# Patient Record
Sex: Female | Born: 1965
Health system: Southern US, Community
[De-identification: ages and names within clinical notes are randomized; demographics above are authoritative.]

## PROBLEM LIST (undated history)

## (undated) DIAGNOSIS — R319 Hematuria, unspecified: Secondary | ICD-10-CM

## (undated) DIAGNOSIS — M545 Low back pain, unspecified: Secondary | ICD-10-CM

## (undated) DIAGNOSIS — J309 Allergic rhinitis, unspecified: Secondary | ICD-10-CM

## (undated) DIAGNOSIS — E039 Hypothyroidism, unspecified: Secondary | ICD-10-CM

## (undated) DIAGNOSIS — K589 Irritable bowel syndrome without diarrhea: Secondary | ICD-10-CM

## (undated) DIAGNOSIS — K219 Gastro-esophageal reflux disease without esophagitis: Secondary | ICD-10-CM

## (undated) DIAGNOSIS — D649 Anemia, unspecified: Secondary | ICD-10-CM

## (undated) DIAGNOSIS — R079 Chest pain, unspecified: Secondary | ICD-10-CM

## (undated) DIAGNOSIS — M797 Fibromyalgia: Secondary | ICD-10-CM

## (undated) DIAGNOSIS — Q211 Atrial septal defect, unspecified: Secondary | ICD-10-CM

## (undated) DIAGNOSIS — I10 Essential (primary) hypertension: Secondary | ICD-10-CM

## (undated) DIAGNOSIS — R51 Headache: Secondary | ICD-10-CM

## (undated) DIAGNOSIS — M069 Rheumatoid arthritis, unspecified: Secondary | ICD-10-CM

## (undated) DIAGNOSIS — M3500 Sicca syndrome, unspecified: Secondary | ICD-10-CM

## (undated) DIAGNOSIS — M419 Scoliosis, unspecified: Secondary | ICD-10-CM

## (undated) DIAGNOSIS — I495 Sick sinus syndrome: Secondary | ICD-10-CM

## (undated) HISTORY — DX: Sick sinus syndrome: I49.5

## (undated) HISTORY — DX: Low back pain: M54.5

## (undated) HISTORY — DX: Sjogren syndrome, unspecified: M35.00

## (undated) HISTORY — DX: Fibromyalgia: M79.7

## (undated) HISTORY — DX: Low back pain, unspecified: M54.50

## (undated) HISTORY — DX: Hematuria, unspecified: R31.9

## (undated) HISTORY — DX: Gastro-esophageal reflux disease without esophagitis: K21.9

## (undated) HISTORY — DX: Chest pain, unspecified: R07.9

## (undated) HISTORY — DX: Allergic rhinitis, unspecified: J30.9

## (undated) HISTORY — PX: TONSILLECTOMY AND ADENOIDECTOMY: SHX28

## (undated) HISTORY — DX: Anemia, unspecified: D64.9

## (undated) HISTORY — DX: Atrial septal defect: Q21.1

## (undated) HISTORY — DX: Essential (primary) hypertension: I10

## (undated) HISTORY — PX: TUBAL LIGATION: SHX77

## (undated) HISTORY — DX: Atrial septal defect, unspecified: Q21.10

## (undated) HISTORY — DX: Hypothyroidism, unspecified: E03.9

## (undated) HISTORY — DX: Rheumatoid arthritis, unspecified: M06.9

## (undated) HISTORY — DX: Headache: R51

## (undated) HISTORY — DX: Irritable bowel syndrome, unspecified: K58.9

---

## 1983-05-19 HISTORY — PX: ASD REPAIR: SHX258

## 1997-11-22 ENCOUNTER — Other Ambulatory Visit: Admission: RE | Admit: 1997-11-22 | Discharge: 1997-11-22 | Payer: Self-pay | Admitting: *Deleted

## 1998-07-23 ENCOUNTER — Emergency Department (HOSPITAL_COMMUNITY): Admission: EM | Admit: 1998-07-23 | Discharge: 1998-07-23 | Payer: Self-pay | Admitting: Emergency Medicine

## 1999-08-22 ENCOUNTER — Encounter: Payer: Self-pay | Admitting: Pulmonary Disease

## 1999-08-22 ENCOUNTER — Ambulatory Visit: Admission: RE | Admit: 1999-08-22 | Discharge: 1999-08-22 | Payer: Self-pay | Admitting: Pulmonary Disease

## 1999-09-03 ENCOUNTER — Emergency Department (HOSPITAL_COMMUNITY): Admission: EM | Admit: 1999-09-03 | Discharge: 1999-09-04 | Payer: Self-pay | Admitting: Emergency Medicine

## 1999-10-14 ENCOUNTER — Encounter: Payer: Self-pay | Admitting: Internal Medicine

## 1999-10-14 ENCOUNTER — Ambulatory Visit (HOSPITAL_COMMUNITY): Admission: RE | Admit: 1999-10-14 | Discharge: 1999-10-14 | Payer: Self-pay | Admitting: Internal Medicine

## 2000-04-27 ENCOUNTER — Ambulatory Visit (HOSPITAL_COMMUNITY): Admission: RE | Admit: 2000-04-27 | Discharge: 2000-04-27 | Payer: Self-pay | Admitting: *Deleted

## 2000-04-27 ENCOUNTER — Encounter: Payer: Self-pay | Admitting: *Deleted

## 2001-07-22 ENCOUNTER — Emergency Department (HOSPITAL_COMMUNITY): Admission: EM | Admit: 2001-07-22 | Discharge: 2001-07-22 | Payer: Self-pay | Admitting: *Deleted

## 2001-12-30 ENCOUNTER — Ambulatory Visit (HOSPITAL_COMMUNITY): Admission: RE | Admit: 2001-12-30 | Discharge: 2001-12-30 | Payer: Self-pay | Admitting: Pulmonary Disease

## 2001-12-30 ENCOUNTER — Encounter: Payer: Self-pay | Admitting: Pulmonary Disease

## 2003-02-18 ENCOUNTER — Ambulatory Visit (HOSPITAL_BASED_OUTPATIENT_CLINIC_OR_DEPARTMENT_OTHER): Admission: RE | Admit: 2003-02-18 | Discharge: 2003-02-18 | Payer: Self-pay | Admitting: Pulmonary Disease

## 2003-10-18 ENCOUNTER — Ambulatory Visit (HOSPITAL_COMMUNITY): Admission: RE | Admit: 2003-10-18 | Discharge: 2003-10-18 | Payer: Self-pay | Admitting: Pulmonary Disease

## 2004-04-28 ENCOUNTER — Ambulatory Visit: Payer: Self-pay | Admitting: Internal Medicine

## 2004-05-05 ENCOUNTER — Ambulatory Visit: Payer: Self-pay | Admitting: Pulmonary Disease

## 2004-08-25 ENCOUNTER — Ambulatory Visit: Payer: Self-pay | Admitting: Pulmonary Disease

## 2004-09-04 ENCOUNTER — Ambulatory Visit: Payer: Self-pay | Admitting: Cardiology

## 2004-09-24 ENCOUNTER — Ambulatory Visit: Payer: Self-pay | Admitting: Internal Medicine

## 2004-10-17 ENCOUNTER — Ambulatory Visit: Payer: Self-pay | Admitting: Pulmonary Disease

## 2004-11-25 ENCOUNTER — Ambulatory Visit: Payer: Self-pay | Admitting: Pulmonary Disease

## 2004-12-08 ENCOUNTER — Ambulatory Visit: Payer: Self-pay

## 2004-12-31 ENCOUNTER — Ambulatory Visit: Payer: Self-pay | Admitting: Pulmonary Disease

## 2005-01-06 ENCOUNTER — Ambulatory Visit: Payer: Self-pay | Admitting: Pulmonary Disease

## 2005-02-27 ENCOUNTER — Ambulatory Visit: Payer: Self-pay | Admitting: Internal Medicine

## 2005-04-01 ENCOUNTER — Ambulatory Visit: Payer: Self-pay | Admitting: Pulmonary Disease

## 2005-04-07 ENCOUNTER — Ambulatory Visit: Payer: Self-pay | Admitting: Pulmonary Disease

## 2005-06-08 ENCOUNTER — Ambulatory Visit: Payer: Self-pay | Admitting: Internal Medicine

## 2005-06-24 ENCOUNTER — Ambulatory Visit: Payer: Self-pay | Admitting: Internal Medicine

## 2005-06-25 ENCOUNTER — Ambulatory Visit: Payer: Self-pay | Admitting: Internal Medicine

## 2005-06-29 ENCOUNTER — Ambulatory Visit: Payer: Self-pay | Admitting: Internal Medicine

## 2005-07-03 ENCOUNTER — Ambulatory Visit: Payer: Self-pay | Admitting: Internal Medicine

## 2005-07-08 ENCOUNTER — Ambulatory Visit: Payer: Self-pay | Admitting: Internal Medicine

## 2005-07-15 ENCOUNTER — Ambulatory Visit: Payer: Self-pay | Admitting: Internal Medicine

## 2005-07-22 ENCOUNTER — Ambulatory Visit: Payer: Self-pay | Admitting: Internal Medicine

## 2005-07-24 ENCOUNTER — Ambulatory Visit: Payer: Self-pay | Admitting: Internal Medicine

## 2005-07-29 ENCOUNTER — Ambulatory Visit: Payer: Self-pay | Admitting: Internal Medicine

## 2005-07-31 ENCOUNTER — Ambulatory Visit: Payer: Self-pay | Admitting: Internal Medicine

## 2005-08-05 ENCOUNTER — Ambulatory Visit: Payer: Self-pay | Admitting: Internal Medicine

## 2005-08-06 ENCOUNTER — Ambulatory Visit: Payer: Self-pay | Admitting: Internal Medicine

## 2005-08-10 ENCOUNTER — Ambulatory Visit: Payer: Self-pay | Admitting: Pulmonary Disease

## 2005-08-10 ENCOUNTER — Ambulatory Visit: Payer: Self-pay | Admitting: Internal Medicine

## 2005-08-19 ENCOUNTER — Ambulatory Visit: Payer: Self-pay | Admitting: Internal Medicine

## 2005-08-20 ENCOUNTER — Ambulatory Visit: Payer: Self-pay | Admitting: Internal Medicine

## 2005-08-25 ENCOUNTER — Ambulatory Visit: Payer: Self-pay | Admitting: Internal Medicine

## 2005-08-27 ENCOUNTER — Ambulatory Visit: Payer: Self-pay | Admitting: Internal Medicine

## 2005-08-31 ENCOUNTER — Ambulatory Visit: Payer: Self-pay | Admitting: Internal Medicine

## 2005-09-02 ENCOUNTER — Ambulatory Visit: Payer: Self-pay | Admitting: Internal Medicine

## 2005-09-04 ENCOUNTER — Ambulatory Visit: Payer: Self-pay | Admitting: Internal Medicine

## 2005-09-08 ENCOUNTER — Ambulatory Visit: Payer: Self-pay | Admitting: Internal Medicine

## 2005-09-09 ENCOUNTER — Ambulatory Visit: Payer: Self-pay | Admitting: Pulmonary Disease

## 2005-09-15 ENCOUNTER — Ambulatory Visit: Payer: Self-pay | Admitting: Internal Medicine

## 2005-09-22 ENCOUNTER — Ambulatory Visit: Payer: Self-pay | Admitting: Internal Medicine

## 2005-09-24 ENCOUNTER — Ambulatory Visit: Payer: Self-pay | Admitting: Internal Medicine

## 2005-10-14 ENCOUNTER — Ambulatory Visit: Payer: Self-pay | Admitting: Internal Medicine

## 2005-10-16 ENCOUNTER — Ambulatory Visit: Payer: Self-pay | Admitting: Internal Medicine

## 2005-10-19 ENCOUNTER — Ambulatory Visit: Payer: Self-pay | Admitting: Pulmonary Disease

## 2005-10-19 ENCOUNTER — Ambulatory Visit: Payer: Self-pay | Admitting: Internal Medicine

## 2005-10-22 ENCOUNTER — Ambulatory Visit: Payer: Self-pay | Admitting: Internal Medicine

## 2005-10-26 ENCOUNTER — Ambulatory Visit: Payer: Self-pay | Admitting: Cardiology

## 2005-10-28 ENCOUNTER — Ambulatory Visit: Payer: Self-pay | Admitting: Internal Medicine

## 2005-10-30 ENCOUNTER — Ambulatory Visit: Payer: Self-pay | Admitting: Internal Medicine

## 2005-11-04 ENCOUNTER — Ambulatory Visit: Payer: Self-pay | Admitting: Internal Medicine

## 2005-11-25 ENCOUNTER — Ambulatory Visit: Payer: Self-pay | Admitting: Internal Medicine

## 2005-11-30 ENCOUNTER — Ambulatory Visit: Payer: Self-pay | Admitting: Internal Medicine

## 2005-11-30 ENCOUNTER — Ambulatory Visit: Payer: Self-pay | Admitting: Pulmonary Disease

## 2005-12-23 ENCOUNTER — Ambulatory Visit: Payer: Self-pay | Admitting: Internal Medicine

## 2005-12-30 ENCOUNTER — Ambulatory Visit: Payer: Self-pay | Admitting: Internal Medicine

## 2006-01-05 ENCOUNTER — Ambulatory Visit: Payer: Self-pay | Admitting: Internal Medicine

## 2006-01-15 ENCOUNTER — Ambulatory Visit: Payer: Self-pay | Admitting: Pulmonary Disease

## 2006-01-20 ENCOUNTER — Ambulatory Visit: Payer: Self-pay | Admitting: Internal Medicine

## 2006-01-29 ENCOUNTER — Ambulatory Visit: Payer: Self-pay | Admitting: Internal Medicine

## 2006-02-05 ENCOUNTER — Ambulatory Visit: Payer: Self-pay | Admitting: Internal Medicine

## 2006-03-05 ENCOUNTER — Ambulatory Visit: Payer: Self-pay | Admitting: Internal Medicine

## 2006-03-08 ENCOUNTER — Ambulatory Visit: Payer: Self-pay | Admitting: Pulmonary Disease

## 2006-06-22 ENCOUNTER — Ambulatory Visit: Payer: Self-pay | Admitting: Internal Medicine

## 2006-07-06 ENCOUNTER — Ambulatory Visit: Payer: Self-pay | Admitting: Pulmonary Disease

## 2006-09-01 ENCOUNTER — Ambulatory Visit: Payer: Self-pay | Admitting: Pulmonary Disease

## 2006-10-27 ENCOUNTER — Emergency Department (HOSPITAL_COMMUNITY): Admission: EM | Admit: 2006-10-27 | Discharge: 2006-10-27 | Payer: Self-pay | Admitting: Emergency Medicine

## 2006-11-15 ENCOUNTER — Ambulatory Visit: Payer: Self-pay | Admitting: Pulmonary Disease

## 2007-01-28 ENCOUNTER — Ambulatory Visit: Payer: Self-pay | Admitting: Pulmonary Disease

## 2007-02-03 ENCOUNTER — Ambulatory Visit: Payer: Self-pay | Admitting: Pulmonary Disease

## 2007-03-21 DIAGNOSIS — J3089 Other allergic rhinitis: Secondary | ICD-10-CM | POA: Insufficient documentation

## 2007-03-21 DIAGNOSIS — K5900 Constipation, unspecified: Secondary | ICD-10-CM | POA: Insufficient documentation

## 2007-03-21 DIAGNOSIS — D649 Anemia, unspecified: Secondary | ICD-10-CM | POA: Insufficient documentation

## 2007-03-21 DIAGNOSIS — J309 Allergic rhinitis, unspecified: Secondary | ICD-10-CM

## 2007-03-21 DIAGNOSIS — M545 Low back pain, unspecified: Secondary | ICD-10-CM | POA: Insufficient documentation

## 2007-03-21 DIAGNOSIS — K589 Irritable bowel syndrome without diarrhea: Secondary | ICD-10-CM | POA: Insufficient documentation

## 2007-03-21 DIAGNOSIS — IMO0001 Reserved for inherently not codable concepts without codable children: Secondary | ICD-10-CM | POA: Insufficient documentation

## 2007-03-21 DIAGNOSIS — M069 Rheumatoid arthritis, unspecified: Secondary | ICD-10-CM | POA: Insufficient documentation

## 2007-03-21 DIAGNOSIS — E039 Hypothyroidism, unspecified: Secondary | ICD-10-CM | POA: Insufficient documentation

## 2007-03-21 DIAGNOSIS — K219 Gastro-esophageal reflux disease without esophagitis: Secondary | ICD-10-CM | POA: Insufficient documentation

## 2007-03-21 DIAGNOSIS — I10 Essential (primary) hypertension: Secondary | ICD-10-CM | POA: Insufficient documentation

## 2007-04-11 ENCOUNTER — Ambulatory Visit: Payer: Self-pay | Admitting: Pulmonary Disease

## 2007-05-27 ENCOUNTER — Telehealth: Payer: Self-pay | Admitting: Pulmonary Disease

## 2007-05-27 ENCOUNTER — Ambulatory Visit: Payer: Self-pay | Admitting: Pulmonary Disease

## 2007-06-29 ENCOUNTER — Ambulatory Visit: Payer: Self-pay | Admitting: Pulmonary Disease

## 2007-06-29 DIAGNOSIS — F411 Generalized anxiety disorder: Secondary | ICD-10-CM | POA: Insufficient documentation

## 2007-06-29 DIAGNOSIS — Q2111 Secundum atrial septal defect: Secondary | ICD-10-CM | POA: Insufficient documentation

## 2007-06-29 DIAGNOSIS — Q211 Atrial septal defect: Secondary | ICD-10-CM | POA: Insufficient documentation

## 2007-07-15 ENCOUNTER — Ambulatory Visit: Payer: Self-pay | Admitting: Internal Medicine

## 2007-08-02 ENCOUNTER — Telehealth (INDEPENDENT_AMBULATORY_CARE_PROVIDER_SITE_OTHER): Payer: Self-pay | Admitting: *Deleted

## 2007-08-03 ENCOUNTER — Ambulatory Visit: Payer: Self-pay | Admitting: Pulmonary Disease

## 2007-08-11 ENCOUNTER — Encounter: Payer: Self-pay | Admitting: Pulmonary Disease

## 2007-08-25 ENCOUNTER — Telehealth: Payer: Self-pay | Admitting: Pulmonary Disease

## 2007-09-04 ENCOUNTER — Encounter: Payer: Self-pay | Admitting: Adult Health

## 2007-09-07 ENCOUNTER — Telehealth: Payer: Self-pay | Admitting: Pulmonary Disease

## 2007-09-09 ENCOUNTER — Ambulatory Visit: Payer: Self-pay | Admitting: Internal Medicine

## 2007-09-09 DIAGNOSIS — R519 Headache, unspecified: Secondary | ICD-10-CM | POA: Insufficient documentation

## 2007-09-09 DIAGNOSIS — R51 Headache: Secondary | ICD-10-CM | POA: Insufficient documentation

## 2007-12-05 ENCOUNTER — Telehealth (INDEPENDENT_AMBULATORY_CARE_PROVIDER_SITE_OTHER): Payer: Self-pay | Admitting: *Deleted

## 2007-12-19 ENCOUNTER — Ambulatory Visit: Payer: Self-pay | Admitting: Internal Medicine

## 2007-12-19 ENCOUNTER — Ambulatory Visit: Payer: Self-pay | Admitting: Pulmonary Disease

## 2008-01-11 ENCOUNTER — Ambulatory Visit: Payer: Self-pay | Admitting: Internal Medicine

## 2008-02-08 ENCOUNTER — Encounter: Payer: Self-pay | Admitting: Pulmonary Disease

## 2008-02-09 ENCOUNTER — Encounter: Payer: Self-pay | Admitting: Pulmonary Disease

## 2008-03-16 ENCOUNTER — Encounter: Payer: Self-pay | Admitting: Pulmonary Disease

## 2008-03-22 ENCOUNTER — Encounter: Payer: Self-pay | Admitting: Pulmonary Disease

## 2008-03-30 ENCOUNTER — Encounter: Payer: Self-pay | Admitting: Pulmonary Disease

## 2008-05-30 ENCOUNTER — Encounter: Payer: Self-pay | Admitting: Pulmonary Disease

## 2008-06-13 ENCOUNTER — Telehealth: Payer: Self-pay | Admitting: Pulmonary Disease

## 2008-08-06 ENCOUNTER — Ambulatory Visit: Payer: Self-pay | Admitting: Internal Medicine

## 2008-08-06 ENCOUNTER — Encounter: Payer: Self-pay | Admitting: Internal Medicine

## 2008-08-06 DIAGNOSIS — I495 Sick sinus syndrome: Secondary | ICD-10-CM | POA: Insufficient documentation

## 2008-08-08 ENCOUNTER — Encounter: Payer: Self-pay | Admitting: Pulmonary Disease

## 2008-08-30 ENCOUNTER — Telehealth (INDEPENDENT_AMBULATORY_CARE_PROVIDER_SITE_OTHER): Payer: Self-pay | Admitting: *Deleted

## 2008-08-30 ENCOUNTER — Emergency Department (HOSPITAL_BASED_OUTPATIENT_CLINIC_OR_DEPARTMENT_OTHER): Admission: EM | Admit: 2008-08-30 | Discharge: 2008-08-30 | Payer: Self-pay | Admitting: Emergency Medicine

## 2008-10-21 ENCOUNTER — Emergency Department (HOSPITAL_BASED_OUTPATIENT_CLINIC_OR_DEPARTMENT_OTHER): Admission: EM | Admit: 2008-10-21 | Discharge: 2008-10-21 | Payer: Self-pay | Admitting: Emergency Medicine

## 2008-10-26 ENCOUNTER — Emergency Department (HOSPITAL_BASED_OUTPATIENT_CLINIC_OR_DEPARTMENT_OTHER): Admission: EM | Admit: 2008-10-26 | Discharge: 2008-10-26 | Payer: Self-pay | Admitting: Emergency Medicine

## 2008-11-02 ENCOUNTER — Telehealth: Payer: Self-pay | Admitting: Pulmonary Disease

## 2008-11-08 ENCOUNTER — Ambulatory Visit: Payer: Self-pay | Admitting: Pulmonary Disease

## 2008-11-15 ENCOUNTER — Encounter: Payer: Self-pay | Admitting: Pulmonary Disease

## 2008-12-11 ENCOUNTER — Emergency Department (HOSPITAL_BASED_OUTPATIENT_CLINIC_OR_DEPARTMENT_OTHER): Admission: EM | Admit: 2008-12-11 | Discharge: 2008-12-11 | Payer: Self-pay | Admitting: Emergency Medicine

## 2009-01-15 ENCOUNTER — Telehealth (INDEPENDENT_AMBULATORY_CARE_PROVIDER_SITE_OTHER): Payer: Self-pay | Admitting: *Deleted

## 2009-01-29 ENCOUNTER — Encounter: Payer: Self-pay | Admitting: Pulmonary Disease

## 2009-02-08 ENCOUNTER — Encounter: Payer: Self-pay | Admitting: Pulmonary Disease

## 2009-02-20 ENCOUNTER — Encounter: Payer: Self-pay | Admitting: Pulmonary Disease

## 2009-03-05 ENCOUNTER — Encounter: Payer: Self-pay | Admitting: Pulmonary Disease

## 2009-04-04 ENCOUNTER — Ambulatory Visit: Payer: Self-pay | Admitting: Pulmonary Disease

## 2009-04-19 ENCOUNTER — Encounter: Payer: Self-pay | Admitting: Pulmonary Disease

## 2009-06-08 ENCOUNTER — Emergency Department (HOSPITAL_BASED_OUTPATIENT_CLINIC_OR_DEPARTMENT_OTHER): Admission: EM | Admit: 2009-06-08 | Discharge: 2009-06-08 | Payer: Self-pay | Admitting: Emergency Medicine

## 2009-07-25 ENCOUNTER — Ambulatory Visit: Payer: Self-pay | Admitting: Pulmonary Disease

## 2009-07-25 ENCOUNTER — Encounter: Payer: Self-pay | Admitting: Adult Health

## 2009-10-15 ENCOUNTER — Telehealth: Payer: Self-pay | Admitting: Internal Medicine

## 2009-10-31 ENCOUNTER — Telehealth: Payer: Self-pay | Admitting: Pulmonary Disease

## 2009-12-17 ENCOUNTER — Encounter (INDEPENDENT_AMBULATORY_CARE_PROVIDER_SITE_OTHER): Payer: Self-pay | Admitting: *Deleted

## 2010-01-22 ENCOUNTER — Ambulatory Visit: Payer: Self-pay | Admitting: Pulmonary Disease

## 2010-01-25 LAB — CONVERTED CEMR LAB
ALT: 18 units/L (ref 0–35)
AST: 27 units/L (ref 0–37)
Albumin: 4 g/dL (ref 3.5–5.2)
Alkaline Phosphatase: 46 units/L (ref 39–117)
BUN: 10 mg/dL (ref 6–23)
Basophils Absolute: 0 10*3/uL (ref 0.0–0.1)
Basophils Relative: 0.3 % (ref 0.0–3.0)
Bilirubin, Direct: 0.1 mg/dL (ref 0.0–0.3)
CO2: 29 meq/L (ref 19–32)
Calcium: 9.1 mg/dL (ref 8.4–10.5)
Chloride: 102 meq/L (ref 96–112)
Creatinine, Ser: 0.8 mg/dL (ref 0.4–1.2)
Eosinophils Absolute: 0 10*3/uL (ref 0.0–0.7)
Eosinophils Relative: 0.6 % (ref 0.0–5.0)
GFR calc non Af Amer: 100.7 mL/min (ref >60)
Glucose, Bld: 79 mg/dL (ref 70–99)
HCT: 38.6 % (ref 36.0–46.0)
Hemoglobin: 13 g/dL (ref 12.0–15.0)
Lymphocytes Relative: 30.4 % (ref 12.0–46.0)
Lymphs Abs: 1.5 10*3/uL (ref 0.7–4.0)
MCHC: 33.7 g/dL (ref 30.0–36.0)
MCV: 89.4 fL (ref 78.0–100.0)
Monocytes Absolute: 0.6 10*3/uL (ref 0.1–1.0)
Monocytes Relative: 12.5 % — ABNORMAL HIGH (ref 3.0–12.0)
Neutro Abs: 2.7 10*3/uL (ref 1.4–7.7)
Neutrophils Relative %: 56.2 % (ref 43.0–77.0)
Platelets: 150 10*3/uL (ref 150.0–400.0)
Potassium: 3.9 meq/L (ref 3.5–5.1)
RBC: 4.32 M/uL (ref 3.87–5.11)
RDW: 14.3 % (ref 11.5–14.6)
Sed Rate: 22 mm/hr (ref 0–22)
Sodium: 138 meq/L (ref 135–145)
TSH: 7.79 microintl units/mL — ABNORMAL HIGH (ref 0.35–5.50)
Total Bilirubin: 0.8 mg/dL (ref 0.3–1.2)
Total Protein: 8 g/dL (ref 6.0–8.3)
WBC: 4.8 10*3/uL (ref 4.5–10.5)

## 2010-01-29 ENCOUNTER — Telehealth: Payer: Self-pay | Admitting: Pulmonary Disease

## 2010-02-13 ENCOUNTER — Ambulatory Visit: Payer: Self-pay | Admitting: Pulmonary Disease

## 2010-02-13 ENCOUNTER — Telehealth: Payer: Self-pay | Admitting: Pulmonary Disease

## 2010-02-14 ENCOUNTER — Ambulatory Visit: Payer: Self-pay | Admitting: Internal Medicine

## 2010-02-14 DIAGNOSIS — R079 Chest pain, unspecified: Secondary | ICD-10-CM | POA: Insufficient documentation

## 2010-02-16 LAB — CONVERTED CEMR LAB
Bilirubin Urine: NEGATIVE
Ketones, ur: NEGATIVE mg/dL
Leukocytes, UA: NEGATIVE
Nitrite: NEGATIVE
Specific Gravity, Urine: 1.01 (ref 1.000–1.030)
Total Protein, Urine: 30 mg/dL
Urine Glucose: NEGATIVE mg/dL
Urobilinogen, UA: 0.2 (ref 0.0–1.0)
pH: 5.5 (ref 5.0–8.0)

## 2010-02-21 ENCOUNTER — Ambulatory Visit: Payer: Self-pay | Admitting: Pulmonary Disease

## 2010-02-21 LAB — CONVERTED CEMR LAB
Bilirubin Urine: NEGATIVE
Ketones, ur: NEGATIVE mg/dL
Leukocytes, UA: NEGATIVE
Nitrite: NEGATIVE
Specific Gravity, Urine: 1.015 (ref 1.000–1.030)
Total Protein, Urine: NEGATIVE mg/dL
Urine Glucose: NEGATIVE mg/dL
Urobilinogen, UA: 0.2 (ref 0.0–1.0)
pH: 7 (ref 5.0–8.0)

## 2010-02-24 ENCOUNTER — Telehealth (INDEPENDENT_AMBULATORY_CARE_PROVIDER_SITE_OTHER): Payer: Self-pay | Admitting: Radiology

## 2010-02-25 ENCOUNTER — Ambulatory Visit: Payer: Self-pay | Admitting: Internal Medicine

## 2010-02-25 ENCOUNTER — Encounter: Payer: Self-pay | Admitting: Internal Medicine

## 2010-02-25 ENCOUNTER — Ambulatory Visit: Payer: Self-pay

## 2010-02-25 ENCOUNTER — Encounter (HOSPITAL_COMMUNITY)
Admission: RE | Admit: 2010-02-25 | Discharge: 2010-03-14 | Payer: Self-pay | Source: Home / Self Care | Admitting: Internal Medicine

## 2010-02-27 ENCOUNTER — Telehealth: Payer: Self-pay | Admitting: Internal Medicine

## 2010-03-10 ENCOUNTER — Telehealth (INDEPENDENT_AMBULATORY_CARE_PROVIDER_SITE_OTHER): Payer: Self-pay | Admitting: *Deleted

## 2010-03-11 ENCOUNTER — Telehealth: Payer: Self-pay | Admitting: Internal Medicine

## 2010-03-11 LAB — CONVERTED CEMR LAB
ALT: 5 units/L (ref 0–35)
AST: 34 units/L (ref 0–37)
Albumin: 3.8 g/dL (ref 3.5–5.2)
Alkaline Phosphatase: 45 units/L (ref 39–117)
Bilirubin, Direct: 0.6 mg/dL — ABNORMAL HIGH (ref 0.0–0.3)
Cholesterol: 126 mg/dL (ref 0–200)
HDL: 42 mg/dL (ref >39.00)
LDL Cholesterol: 75 mg/dL (ref 0–99)
Total Bilirubin: 1.5 mg/dL — ABNORMAL HIGH (ref 0.3–1.2)
Total CHOL/HDL Ratio: 3
Total Protein: 7.4 g/dL (ref 6.0–8.3)
Triglycerides: 43 mg/dL (ref 0.0–149.0)
VLDL: 8.6 mg/dL (ref 0.0–40.0)

## 2010-03-13 ENCOUNTER — Encounter: Payer: Self-pay | Admitting: Pulmonary Disease

## 2010-03-25 ENCOUNTER — Ambulatory Visit: Payer: Self-pay | Admitting: Pulmonary Disease

## 2010-03-25 DIAGNOSIS — R319 Hematuria, unspecified: Secondary | ICD-10-CM | POA: Insufficient documentation

## 2010-03-26 LAB — CONVERTED CEMR LAB: TSH: 4.67 microintl units/mL (ref 0.35–5.50)

## 2010-03-27 ENCOUNTER — Encounter: Payer: Self-pay | Admitting: Pulmonary Disease

## 2010-06-17 NOTE — Letter (Signed)
Summary: rx possible changes/[facility]  rx possible changes/[facility]   Imported By: Lester Sweetwater 02/29/2008 10:34:30  _____________________________________________________________________  External Attachment:    Type:   Image     Comment:   External Document

## 2010-06-17 NOTE — Assessment & Plan Note (Signed)
Summary: fu after er visit/[facility]   Chief Complaint:  cough congesiton.  History of Present Illness: 45 year old BF hx of  RA and Fibromyalgia and is followed by DrDeveshwar for rheumatology on Prednisone, Plaquenil, Lyrica, Imipramine, Flexeril, Tramadol, and Vicodin...   12/19/07- Compalins of 1 week of sinus congestion, sinus press/pain, drainage. Denies chest pain, dyspnea, orthopnea, hemoptysis, fever, n/v/d, edema.      Updated Prior Medication List: PROAIR HFA 108 (90 BASE) MCG/ACT  AERS (ALBUTEROL SULFATE) 1-2 inhalations QID as needed for wheezing... NASONEX 50 MCG/ACT  SUSP (MOMETASONE FUROATE) 2 sprays in each nostril at bedtime FEXOFENADINE HCL 180 MG  TABS (FEXOFENADINE HCL) take 1 by mouth once daily for allergies... NORVASC 5 MG  TABS (AMLODIPINE BESYLATE) take 1 by mouth once daily NEXIUM 40 MG  CPDR (ESOMEPRAZOLE MAGNESIUM) Take 1 tablet by mouth once a day METOCLOPRAMIDE HCL 10 MG  TABS (METOCLOPRAMIDE HCL) take as directed... HYOSCYAMINE SULFATE 0.125 MG  TABS (HYOSCYAMINE SULFATE) take 1 every 4 hours as needed MIRALAX   POWD (POLYETHYLENE GLYCOL 3350) 1 capful in water once or twice daily as directed... SYNTHROID 137 MCG  TABS (LEVOTHYROXINE SODIUM) Take 1 tablet by mouth once a day PLAQUENIL 200 MG  TABS (HYDROXYCHLOROQUINE SULFATE) take one tab daily as directed by DrDeveshwar... LYRICA 50 MG  CAPS (PREGABALIN) Take one tab by mouth three times a day as directed... PREDNISONE 5 MG  TABS (PREDNISONE) take as directed by DrDeveshwar... CENTRUM   TABS (MULTIPLE VITAMINS-MINERALS) Take 1 tablet by mouth once a day FOLIC ACID 1 MG  TABS (FOLIC ACID) take one tab by mouth two times a day as directed... TRAMADOL HCL 50 MG  TABS (TRAMADOL HCL) take 1 every 4 hours as needed HYDROCODONE-ACETAMINOPHEN 5-500 MG  TABS (HYDROCODONE-ACETAMINOPHEN) take one tab every 6-8 H as needed for pain... IMIPRAMINE HCL 25 MG  TABS (IMIPRAMINE HCL) take 1-2 tabs by mouth at bedtime as needed for  sleep... CYCLOBENZAPRINE HCL 10 MG  TABS (CYCLOBENZAPRINE HCL) take 1-2 tabs by mouth at bedtime as needed for sleep... MIDRIN 325-65-100 MG  CAPS (APAP-ISOMETHEPTENE-DICHLORAL) 1 by mouth every 4 hr as needed headache  Current Allergies (reviewed today): No known allergies    Family History:    Reviewed history and no changes required:  Social History:    Reviewed history and no changes required:   Risk Factors: Tobacco use:  never   Review of Systems      See HPI   Vital Signs:  Patient Profile:   45 Years Old Female Weight:      133 pounds O2 Sat:      100 % O2 treatment:    Room Air Temp:     98.4 degrees F oral Pulse rate:   44 / minute BP sitting:   126 / 76  (left arm) Cuff size:   regular  Vitals Entered By: Boone Master CNA (December 19, 2007 9:28 AM)             Is Patient Diabetic? No Comments Medications reviewed with patient Boone Master CNA  December 19, 2007 9:30 AM      Physical Exam  WD, Thin, [name] in NAD... GENERAL:  Alert & oriented; pleasant & cooperative. HEENT:  [place]/AT, EOM-wnl, PERRLA, EACs-clear, TMs-wnl, NOSE-red, max. sinus tender.  THROAT-clear & wnl. NECK:  Tender post neck area to right, +triggers in trapezius, decr ROM neck, neuromusc intact w/ norm DTR's & strength;  no JVD; normal carotid impulses w/o  bruits; no thyromegaly or nodules palpated; no lymphadenopathy. CHEST:  Clear to P & A; without wheezes/ rales/ or rhonchi. HEART:  Regular Rhythm;  gr 1-2 SEM without rubs or gallops... ABDOMEN:  Soft & nontender; normal bowel sounds; no organomegaly or masses detected. EXT: without deformities, mild arthritic changes; no varicose veins/ venous insuffic/ or edema. DERM:  No lesions noted; no rash etc...        Impression & Recommendations:  Problem # 1:  SINUSITIS, ACUTE (ICD-461.9) Omnicef 300mg  2 by mouth once daily with food for 10 days Mucinex two times a day  Saline and Afrin 2 puffs two times a day for 5 days    Please contact office for sooner follow up if symptoms do not improve or worsen  Her updated medication list for this problem includes:    Nasonex 50 Mcg/act Susp (Mometasone furoate) .Marland Kitchen... 2 sprays in each nostril at bedtime    Omnicef 300 Mg Caps (Cefdinir) .Marland Kitchen... 2 by mouth once daily  Orders: Est. Patient Level III (81191)   Medications Added to Medication List This Visit: 1)  Omnicef 300 Mg Caps (Cefdinir) .... 2 by mouth once daily  Complete Medication List: 1)  Proair Hfa 108 (90 Base) Mcg/act Aers (Albuterol sulfate) .Marland Kitchen.. 1-2 inhalations qid as needed for wheezing... 2)  Nasonex 50 Mcg/act Susp (Mometasone furoate) .... 2 sprays in each nostril at bedtime 3)  Fexofenadine Hcl 180 Mg Tabs (Fexofenadine hcl) .... Take 1 by mouth once daily for allergies.Marland KitchenMarland Kitchen 4)  Norvasc 5 Mg Tabs (Amlodipine besylate) .... Take 1 by mouth once daily 5)  Nexium 40 Mg Cpdr (Esomeprazole magnesium) .... Take 1 tablet by mouth once a day 6)  Metoclopramide Hcl 10 Mg Tabs (Metoclopramide hcl) .... Take as directed... 7)  Hyoscyamine Sulfate 0.125 Mg Tabs (Hyoscyamine sulfate) .... Take 1 every 4 hours as needed 8)  Miralax Powd (Polyethylene glycol 3350) .Marland Kitchen.. 1 capful in water once or twice daily as directed... 9)  Synthroid 137 Mcg Tabs (Levothyroxine sodium) .... Take 1 tablet by mouth once a day 10)  Plaquenil 200 Mg Tabs (Hydroxychloroquine sulfate) .... Take one tab daily as directed by drdeveshwar... 11)  Lyrica 50 Mg Caps (Pregabalin) .... Take one tab by mouth three times a day as directed... 12)  Prednisone 5 Mg Tabs (Prednisone) .... Take as directed by drdeveshwar... 13)  Centrum Tabs (Multiple vitamins-minerals) .... Take 1 tablet by mouth once a day 14)  Folic Acid 1 Mg Tabs (Folic acid) .... Take one tab by mouth two times a day as directed... 15)  Tramadol Hcl 50 Mg Tabs (Tramadol hcl) .... Take 1 every 4 hours as needed 16)  Hydrocodone-acetaminophen 5-500 Mg Tabs  (Hydrocodone-acetaminophen) .... Take one tab every 6-8 h as needed for pain... 17)  Imipramine Hcl 25 Mg Tabs (Imipramine hcl) .... Take 1-2 tabs by mouth at bedtime as needed for sleep... 18)  Cyclobenzaprine Hcl 10 Mg Tabs (Cyclobenzaprine hcl) .... Take 1-2 tabs by mouth at bedtime as needed for sleep... 19)  Midrin 325-65-100 Mg Caps (Apap-isometheptene-dichloral) .Marland Kitchen.. 1 by mouth every 4 hr as needed headache 20)  Omnicef 300 Mg Caps (Cefdinir) .... 2 by mouth once daily  Other Orders: T-2 View CXR, Same Day (71020.5TC)   Patient Instructions: 1)  Omnicef 300mg  2 by mouth once daily with food for 10 days 2)  Mucinex two times a day  3)  Saline and Afrin 2 puffs two times a day for 5 days  4)  Please  contact office for sooner follow up if symptoms do not improve or worsen    Prescriptions: OMNICEF 300 MG  CAPS (CEFDINIR) 2 by mouth once daily  #20 x 0   Entered and Authorized by:   Rubye Oaks NP   Signed by:   Tammy Parrett NP on 12/19/2007   Method used:   Electronically sent to ...       [id number]       [address]       [place], [place]  [address]       Ph: [phone] or [phone]       Fax: [phone]   RxID:   [id number]  ]

## 2010-06-17 NOTE — Assessment & Plan Note (Signed)
Summary: Brittany Hodges   Visit Type:  Follow-up Primary Provider:  nadel  CC:  no cardiac concerns.  History of Present Illness: Brittany Hodges has no complaints related exercise intolerance; it remained quite stable.  Current Problems (verified): 1)  Sinoatrial Node Dysfunction  (ICD-427.81) 2)  Sinusitis, Acute  (ICD-461.9) 3)  Headache, Chronic  (ICD-784.0) 4)  Neck Pain, Acute  (ICD-723.1) 5)  Allergic Rhinitis  (ICD-477.9) 6)  Asthma  (ICD-493.90) 7)  Hypertension  (ICD-401.9) 8)  Hx of Atrial Septal Defect  (ICD-745.5) 9)  Hypothyroidism  (ICD-244.9) 10)  Gerd  (ICD-530.81) 11)  Irritable Bowel Syndrome  (ICD-564.1) 12)  Constipation  (ICD-564.00) 13)  Rheumatoid Arthritis  (ICD-714.0) 14)  Fibromyalgia  (ICD-729.1) 15)  Low Back Pain  (ICD-724.2) 16)  Anxiety  (ICD-300.00) 17)  Anemia  (ICD-285.9) 18)  Sebaceous Cyst, Scalp  (ICD-706.2)  Current Medications (verified): 1)  Proair Hfa 108 (90 Base) Mcg/act  Aers (Albuterol Sulfate) .Marland Kitchen.. 1-2 Inhalations Qid As Needed For Wheezing... 2)  Nasonex 50 Mcg/act  Susp (Mometasone Furoate) .... 2 Sprays in Each Nostril At Bedtime 3)  Fexofenadine Hcl 180 Mg  Tabs (Fexofenadine Hcl) .... Take 1 By Mouth Once Daily For Allergies.Marland KitchenMarland Kitchen 4)  Norvasc 5 Mg  Tabs (Amlodipine Besylate) .... Take 1 By Mouth Once Daily 5)  Nexium 40 Mg  Cpdr (Esomeprazole Magnesium) .... Take 1 Tablet By Mouth Once A Day 6)  Miralax   Powd (Polyethylene Glycol 3350) .Marland Kitchen.. 1 Capful in Water Once or Twice Daily As Directed... 7)  Prednisone 5 Mg  Tabs (Prednisone) .... Take As Directed By [name]... 8)  Centrum   Tabs (Multiple Vitamins-Minerals) .... Take 1 Tablet By Mouth Once A Day 9)  Folic Acid 1 Mg  Tabs (Folic Acid) .... Take One Tab By Mouth Two Times A Day As Directed... 10)  Tramadol Hcl 50 Mg  Tabs (Tramadol Hcl) .... Take 1 Every 4 Hours As Needed 11)  Hydrocodone-Acetaminophen 5-500 Mg  Tabs (Hydrocodone-Acetaminophen) .... Take One Tab Every  6-8 H As Needed For Pain... 12)  Midrin 325-65-100 Mg Caps (Apap-Isometheptene-Dichloral) .... Take One Tablet By Mouth Every 4 Hours As Needed For Headache 13)  Mucinex D 60-600 Mg Xr12h-Tab (Pseudoephedrine-Guaifenesin) .... Take 1 Tablets By Mouth Two Times A Day With Lots of Fluids 14)  Levothyroxine Sodium 88 Mcg Tabs (Levothyroxine Sodium) .Marland Kitchen.. 1 Once Daily 15)  Promethazine Hcl 25 Mg Tabs (Promethazine Hcl) .... As Needed 16)  Vitamin D 16109 Unit  Caps (Ergocalciferol) .... Twice A Week For 8 Weeks 17)  Cefdinir 300 Mg Caps (Cefdinir) .... 2 Daily 18)  Methotrexate 2.5 Mg Tabs (Methotrexate Sodium) .... 6 Tabs Per Week 19)  Simply Saline 0.9 % Aers (Saline) .... Prn 20)  Afrin .... Prn  Allergies (verified): No Known Drug Allergies  Vital Signs:  Patient profile:   45 year old female Height:      68 inches Weight:      140 pounds BMI:     21.36 Pulse rate:   42 / minute BP sitting:   144 / 82  (left arm)  Vitals Entered By: Laurance Flatten CMA (August 06, 2008 3:59 PM)  Physical Exam  General:  Well developed, well nourished, in no acute distress. Lungs:  Clear bilaterally to auscultation and percussion. Heart:  S1 was widely split S2 is normal there is a 2/6 systolic ejection murmur her lungs right sternal border Extremities:  No clubbing or cyanosis. No edema   Impression & Recommendations:  Problem # 1:  SINOATRIAL NODE DYSFUNCTION (ICD-427.81) the patient's rhythm remained stable. She continues to have good exercise tolerance and in the past has had no evidence of objective chronotropic incompetence. Her updated medication list for this problem includes:    Norvasc 5 Mg Tabs (Amlodipine besylate) .Marland Kitchen... Take 1 by mouth once daily  Problem # 2:  Hx of ATRIAL SEPTAL DEFECT (ICD-745.5) No issues at this time.

## 2010-06-17 NOTE — Letter (Signed)
Summary: [facility]  [facility]   Imported By: Esmeralda Links D'jimraou 06/13/2008 11:28:04  _____________________________________________________________________  External Attachment:    Type:   Image     Comment:   External Document

## 2010-06-17 NOTE — Progress Notes (Signed)
Summary: phone note  Phone Note Call from Patient   Caller: Patient Summary of Call: Rt Lower neck "trapezius muscle is killing her" x pain. Been going on 24-36h. Worsened 1 hour ago when turning in head. Known to have fibromyalgia/RA. No fever. Husband feels local area is swollen and worm by palpation - thinks it is because muscle is tight. Not red. Wants pain med. Asked her to take one tablet of tramadol and hydrocodone each now. REport to office in am for urgent care visit. Call office for appt. ...................................................................Kalman Shan MD  August 02, 2007 2:09 AM  Initial call taken by: Kalman Shan MD,  August 02, 2007 2:09 AM  Follow-up for Phone Call        CALL PT TO SEE IF BETTER, OV TODAY IF NEEDED.  Follow-up by: Rubye Oaks NP,  August 02, 2007 9:18 AM  Additional Follow-up for Phone Call Additional follow up Details #1::        Attempted to call phone #. No answer and unable to leave message. Attempted to call work number. No answer.   Additional Follow-up by: Cyndia Diver LPN,  August 02, 2007 10:24 AM    Additional Follow-up for Phone Call Additional follow up Details #2::    spoke with pt. pt stated she is "no better."  the muscle in the lower part of her neck is still hurting and painful. and pt hasn't been able to sleep.  pt states dr. Jane Canary recommendations to take tramadol and hydrocodone did not help. pt has ov with dr. Kriste Basque tomorrow 08-03-2007 @ 3:30. pt would like something to help the pain until she can get in tomorrow. please advise.     ***SICK MESSAGE*** Follow-up by: Cyndia Diver LPN,  August 02, 2007 2:30 PM  Additional Follow-up for Phone Call Additional follow up Details #3:: Details for Additional Follow-up Action Taken: per SN---hydrocodone is the best---wait for ov on 3-18 ..................................................................Marland KitchenMarijo File CMA  August 02, 2007 3:55 PM   spoke with pt. pt  aware dr. Kriste Basque states hydrocodone is the best he can offer her for tonight.  will evaluate her tomorrow at [facility]. pt verbalized understanding and denied any further questions or concerns.   Additional Follow-up by: Cyndia Diver LPN,  August 02, 2007 4:07 PM

## 2010-06-17 NOTE — Progress Notes (Signed)
Summary: test results   Phone Note Call from Patient Call back at Home Phone [phone]   Caller: Patient Reason for Call: Talk to Nurse, Lab or Test Results Summary of Call: pt wants to know stress and blood work results. Initial call taken by: Roe Coombs,  March 11, 2010 4:36 PM  Follow-up for Phone Call        adv pt of results. Follow-up by: Claris Gladden RN,  March 11, 2010 4:52 PM

## 2010-06-17 NOTE — Assessment & Plan Note (Signed)
Summary: rov-/lw   Primary Care Provider:  Robin Pafford  CC:  15 month ROV & review of mult medical problems....  History of Present Illness: [name]    ~  3/09:  c/o neck pain... she has a hx of RA and Fibromyalgia and is followed by [facility] for rheumatology on Prednisone, Plaquenil, Lyrica, Imipramine, Flexeril, Tramadol, and Vicodin... presents w/ 4d hx of pain in the neck, esp on the right posterior neck area w/ decr ROM, tenderness to palpation, and trigger points in her trapezius region... she denies crepitus w/ rotation of head, pain down the arms, weakness in the arms, etc... CSpine films from 8/03 are avail in [place] and showed mild scoliosis and kyphosis, & mild disc sp narrowing C4-5... Rx w/ rest/ heat/ Medrol/ Flexeril/ etc.   ~  November 08, 2008:  she has been followed over the last year by [employer], DrKerr, and DrKlein... returns today w/ c/o headaches that occur monthly just after onset on menses- frontal/ temples/ and posterior band-like pain> likely mixed muscle contraction/ migraine headaches... she has Midrin & this helps the pain when she takes it... ** we discussed starting the Midrin 1-2/d w/ the onset of menses to prevent the onset of these headaches- she will let me know how this works.   Current Problems:   ALLERGIC RHINITIS (ICD-477.9) - on ALLEGRA 180mg /d & NASONEX 2spQhs...  ASTHMA (ICD-493.90) -  uses PROAIR as needed... denies cough, sputum, hemoptysis, worsening dyspnea,  wheezing, chest pains, snoring, daytime hypersomnolence, etc...   HYPERTENSION (ICD-401.9) - controlled on Norvasc 5mg /d... tol well and denies visual changes, CP, palipit, dizziness, syncope, dyspnea, edema, etc... BP= 104/70 today & she wonders about decreasing her Norvasc to 1/2 tab daily...  Hx of ATRIAL SEPTAL DEFECT (ICD-745.5) - S/P ASD repair 1985... she has seen DrHochrein & DrKlein  in the past... last 2DEcho 7/06 showed tr MR & TR, normal LVF...  ~  Cards f/u 3/10 w/ DrKlein for hx  SA node dysfunction- doing OK/ stable rhythm/ good exerc capacity.  HYPOTHYROIDISM (ICD-244.9) - on Synthroid 66mcg/d now per DrKerr... last TSH here was 1.32 in 2007.  ~  9/09: note from DrKerr reviewed- she had been off Synthroid & TSH was 13.14 w/ Synthroid restarted...  GERD (ICD-530.81) IRRITABLE BOWEL SYNDROME (ICD-564.1)   << GI - followed by DrWalker in [place]  >> CONSTIPATION (ICD-564.00)  RHEUMATOID ARTHRITIS (ICD-714.0)   <<  Rheum -  followed by DrDeveshwar  >> FIBROMYALGIA (ICD-729.1)                       off Pred now, on MTX 6/wk + Folic. LOW BACK PAIN (ICD-724.2)  ANXIETY (ICD-300.00) - not currently taking medication.  ANEMIA (ICD-285.9)  ~  labs 7/09 from [place] showed Hg= 12.4  SEBACEOUS CYST, SCALP (ICD-706.2) - this resolved w/ Keflex... she has a hx of MRSA exposure from her child and had a MRSA buttock abscess drained by Trecia Rogers 9/08...   Allergies (verified): No Known Drug Allergies  Comments:  Nurse/Medical Assistant: The patient's medications and allergies were reviewed with the patient and were updated in the Medication and Allergy Lists.  Past History:  Past Medical History: ALLERGIC RHINITIS (ICD-477.9) ASTHMA (ICD-493.90) HYPERTENSION (ICD-401.9) Hx of ATRIAL SEPTAL DEFECT (ICD-745.5) SINOATRIAL NODE DYSFUNCTION (ICD-427.81) HYPOTHYROIDISM (ICD-244.9) GERD (ICD-530.81) IRRITABLE BOWEL SYNDROME (ICD-564.1) CONSTIPATION (ICD-564.00) RHEUMATOID ARTHRITIS (ICD-714.0) FIBROMYALGIA (ICD-729.1) LOW BACK PAIN (ICD-724.2) ANXIETY (ICD-300.00) ANEMIA (ICD-285.9) SEBACEOUS CYST, SCALP (ICD-706.2)  Past Surgical History: tubal ligation ASD repair  Family History: Reviewed history from 08/03/2008 and no changes required. Positive for RA, hypertension, and diabetes  Social History: Reviewed history from 08/03/2008 and no changes required. Tobacco Use - No.  Alcohol Use - no Drug Use - no  Review of Systems      See HPI  The patient denies  anorexia, fever, weight loss, weight gain, vision loss, decreased hearing, hoarseness, chest pain, syncope, dyspnea on exertion, peripheral edema, prolonged cough, headaches, hemoptysis, abdominal pain, melena, hematochezia, severe indigestion/heartburn, hematuria, incontinence, muscle weakness, suspicious skin lesions, transient blindness, difficulty walking, depression, unusual weight change, abnormal bleeding, enlarged lymph nodes, and angioedema.   Neuro:  Complains of headaches; denies brief paralysis, difficulty with concentration, disturbances in coordination, falling down, inability to speak, memory loss, numbness, poor balance, seizures, sensation of room spinning, tingling, tremors, visual disturbances, and weakness.  Vital Signs:  Patient profile:   45 year old female Height:      68 inches Weight:      139.13 pounds BMI:     21.23 O2 Sat:      100 % on Room air Temp:     97.1 degrees F oral Pulse rate:   43 / minute BP sitting:   104 / 70  (right arm) Cuff size:   regular  Vitals Entered By: Marijo File CMA (November 08, 2008 2:09 PM)  O2 Sat at Rest %:  100 O2 Flow:  Room air CC: 15 month ROV & review of mult medical problems... Comments reviewed meds today--updated   Physical Exam  Additional Exam:  WD, Thin, [name] in NAD... GENERAL:  Alert & oriented; pleasant & cooperative. HEENT:  [place]/AT, EOM-wnl, PERRLA, EACs-clear, TMs-wnl, NOSE-red, max. sinus tender.  THROAT-clear & wnl. NECK:  +triggers in trapezius, decr ROM neck, neuromusc intact w/ norm DTR's & strength;  no JVD; normal carotid impulses w/o bruits; no thyromegaly or nodules palpated; no lymphadenopathy. CHEST:  Clear to P & A; without wheezes/ rales/ or rhonchi. HEART:  Regular Rhythm;  gr 1-2 SEM without rubs or gallops... ABDOMEN:  Soft & nontender; normal bowel sounds; no organomegaly or masses detected. EXT: without deformities, mild arthritic changes; no varicose veins/ venous insuffic/ or edema. DERM:   No lesions noted; no rash etc...     Impression & Recommendations:  Problem # 1:  HEADACHE, CHRONIC (ICD-784.0) Her headaches are related to her menstrual cycle, mixed in character,  and seem to be well treated w/ Midrin... Wer discussed Rx w/ Midrin at onset of periods- taking 1-3 daily to prevent headaches, then wean off until the next cycle...  Her updated medication list for this problem includes:    Tramadol Hcl 50 Mg Tabs (Tramadol hcl) .Marland Kitchen... Take 1 every 4 hours as needed    Hydrocodone-acetaminophen 5-500 Mg Tabs (Hydrocodone-acetaminophen) .Marland Kitchen... Take one tab every 6-8 h as needed for pain...    Midrin 325-65-100 Mg Caps (Apap-isometheptene-dichloral) .Marland Kitchen... Take one tablet by mouth every 4 hours as needed for headache  Problem # 2:  ALLERGIC RHINITIS (ICD-477.9) Stable on meds... Her updated medication list for this problem includes:    Nasonex 50 Mcg/act Susp (Mometasone furoate) .Marland Kitchen... 2 sprays in each nostril at bedtime    Fexofenadine Hcl 180 Mg Tabs (Fexofenadine hcl) .Marland Kitchen... Take 1 by mouth once daily for allergies...    Promethazine Hcl 25 Mg Tabs (Promethazine hcl) .Marland Kitchen... As needed    Simply Saline 0.9 % Aers (Saline) .Marland Kitchen... Prn  Problem # 3:  ASTHMA (ICD-493.90) No attacks or  exas over the last yr... Her updated medication list for this problem includes:    Proair Hfa 108 (90 Base) Mcg/act Aers (Albuterol sulfate) .Marland Kitchen... 1-2 inhalations qid as needed for wheezing...    Prednisone 5 Mg Tabs (Prednisone) .Marland Kitchen... Take as directed by drdeveshwar...  Problem # 4:  HYPERTENSION (ICD-401.9) Her BP is on the low side and OK to decr the Norvasc to 1/2 tab daily... Monitor BP at home and call for questions... Her updated medication list for this problem includes:    Norvasc 5 Mg Tabs (Amlodipine besylate) .Marland Kitchen... Take 1/2 tab by mouth once daily...  Problem # 5:  SINOATRIAL NODE DYSFUNCTION (ICD-427.81) She had f/u w/ DrKlein- doing well...  Problem # 6:  GERD (ICD-530.81) GI is  followed in [facility]... Her updated medication list for this problem includes:    Nexium 40 Mg Cpdr (Esomeprazole magnesium) .Marland Kitchen... Take 1 tablet by mouth once a day  Problem # 7:  RHEUMATOID ARTHRITIS (ICD-714.0) Followed regularly by DrDeveshwar w/ labs checked frequently due to MTX etc...  Problem # 8:  HYPOTHYROIDISM (ICD-244.9) Followed by DrKerr w/ labs from him as well... Her updated medication list for this problem includes:    Levothyroxine Sodium 88 Mcg Tabs (Levothyroxine sodium) .Marland Kitchen... 1 once daily  Complete Medication List: 1)  Proair Hfa 108 (90 Base) Mcg/act Aers (Albuterol sulfate) .Marland Kitchen.. 1-2 inhalations qid as needed for wheezing... 2)  Nasonex 50 Mcg/act Susp (Mometasone furoate) .... 2 sprays in each nostril at bedtime 3)  Fexofenadine Hcl 180 Mg Tabs (Fexofenadine hcl) .... Take 1 by mouth once daily for allergies.Marland KitchenMarland Kitchen 4)  Norvasc 5 Mg Tabs (Amlodipine besylate) .... Take 1/2 tab by mouth once daily.Marland KitchenMarland Kitchen 5)  Nexium 40 Mg Cpdr (Esomeprazole magnesium) .... Take 1 tablet by mouth once a day 6)  Miralax Powd (Polyethylene glycol 3350) .Marland Kitchen.. 1 capful in water once or twice daily as directed... 7)  Prednisone 5 Mg Tabs (Prednisone) .... Take as directed by drdeveshwar... 8)  Centrum Tabs (Multiple vitamins-minerals) .... Take 1 tablet by mouth once a day 9)  Folic Acid 1 Mg Tabs (Folic acid) .... Take one tab by mouth two times a day as directed... 10)  Tramadol Hcl 50 Mg Tabs (Tramadol hcl) .... Take 1 every 4 hours as needed 11)  Hydrocodone-acetaminophen 5-500 Mg Tabs (Hydrocodone-acetaminophen) .... Take one tab every 6-8 h as needed for pain... 12)  Midrin 325-65-100 Mg Caps (Apap-isometheptene-dichloral) .... Take one tablet by mouth every 4 hours as needed for headache 13)  Mucinex D 60-600 Mg Xr12h-tab (Pseudoephedrine-guaifenesin) .... Take 1 tablets by mouth two times a day with lots of fluids 14)  Levothyroxine Sodium 88 Mcg Tabs (Levothyroxine sodium) .Marland Kitchen.. 1 once  daily 15)  Promethazine Hcl 25 Mg Tabs (Promethazine hcl) .... As needed 16)  Vitamin D 1000 Unit Tabs (Cholecalciferol) .... Take one tablet by mouth once daily 17)  Cefdinir 300 Mg Caps (Cefdinir) .... 2 daily 18)  Methotrexate 2.5 Mg Tabs (Methotrexate sodium) .... 6 tabs per week 19)  Simply Saline 0.9 % Aers (Saline) .... Prn 20)  Afrin  .... Prn  Other Orders: Prescription Created Electronically [phone])  Patient Instructions: 1)  Today we updated your med list- see below.... 2)  We discussed decreasing the Norvasc to 1/2 tab daily-  please monitor your BP carefully at home and call for any problems.Marland KitchenMarland Kitchen 3)  We refilled your Fexofenadine & Midrin today... 4)  For your Headache: try the Midrin more regularly 1-3 daily around the  time of the headaches as discussed... Prescriptions: MIDRIN 325-65-100 MG CAPS (APAP-ISOMETHEPTENE-DICHLORAL) take one tablet by mouth every 4 hours as needed for headache  #100 x 5   Entered and Authorized by:   Michele Mcalpine MD   Signed by:   Michele Mcalpine MD on 11/08/2008   Method used:   Print then Give to Patient   RxID:   [id number] FEXOFENADINE HCL 180 MG  TABS (FEXOFENADINE HCL) take 1 by mouth once daily for allergies...  #30 x prn   Entered and Authorized by:   Michele Mcalpine MD   Signed by:   Michele Mcalpine MD on 11/08/2008   Method used:   Print then Give to Patient   RxID:   [id number]

## 2010-06-17 NOTE — Progress Notes (Signed)
Summary: chest/back pain   Phone Note Call from Patient Call back at [phone]   Caller: Patient Reason for Call: Talk to Nurse Summary of Call: having chest, shoulder blade pain, ongoing since last week, not having chest pain at the moment just back/shoulder pain Initial call taken by: Migdalia Dk,  Oct 15, 2009 9:52 AM  Follow-up for Phone Call        spoke with pt who reports having discomfort/pain in between shoulder blades off/on for 1 week. Occasionally has SOB with it.  She has not had any new medications.  Doesn't think her heart rate has been slow or irregular.  No s/s at this time.  Pt will schedule to see Dr Kriste Basque and is scheduled to see Dr Graciela Husbands 7/18 for f/u.  She will call back if s/s return. Follow-up by: Charolotte Capuchin, RN,  Oct 15, 2009 10:19 AM

## 2010-06-17 NOTE — Assessment & Plan Note (Signed)
Summary: Cardiology Nuclear Testing  Nuclear Med Background Indications for Stress Test: Evaluation for Ischemia   History: Asthma, COPD, Echo, GXT  History Comments: Nl EF by Echo 2006 H/O AS defect and SA node dysfx.  Symptoms: Chest Pain, Chest Pain with Exertion, Fatigue, SOB  Symptoms Comments: CP with neck and arm radiation. Last episode of CP:5 days ago.   Nuclear Pre-Procedure Cardiac Risk Factors: Family History - CAD, Hypertension Caffeine/Decaff Intake: NONE NPO After: 7:30 PM IV 0.9% NS with Angio Cath: 22g     IV Site: R Hand IV Started by: Doyne Keel, CNMT Chest Size (in) 36     Cup Size B     Height (in): 68 Weight (lb): 140 BMI: 21.36  Nuclear Med Study 1 or 2 day study:  1 day     Stress Test Type:  Stress Reading MD:  Arvilla Meres, MD     Referring MD:  Berton Mount, MD Resting Radionuclide:  Technetium 16m Tetrofosmin     Resting Radionuclide Dose:  11.0 mCi  Stress Radionuclide:  Technetium 41m Tetrofosmin     Stress Radionuclide Dose:  33.0 mCi   Stress Protocol Exercise Time (min):  9:46 min     Max HR:  153 bpm     Predicted Max HR:  174 bpm  Max Systolic BP: 203 mm Hg     Percent Max HR:  87.93 %     METS: 10.6 Rate Pressure Product:  16109    Stress Test Technologist:  Cathlyn Parsons, RN     Nuclear Technologist:  Domenic Polite, CNMT  Rest Procedure  Myocardial perfusion imaging was performed at rest 45 minutes following the intravenous administration of Technetium 35m Tetrofosmin.  Stress Procedure  The patient exercised for 9:46.  The patient stopped due to fatigue and denied any chest pain.  There were no significant ST-T wave changes.  Baseline EKG showed junctional rhythm.  There were rare PAC's with exercise and rare blocked PAC's in recovery.  She did have a hypertensive response to exercise, 203/102 and 197/112 in recovery.  Technetium 37m Tetrofosmin was injected at peak exercise and myocardial perfusion imaging was performed  after a brief delay.  QPS Raw Data Images:  Normal; no motion artifact; normal heart/lung ratio. Stress Images:  Mildly reduced uptake in the anterior wall Rest Images:  Trivially reduced uptake in the anterior wall Subtraction (SDS):  Mild reversible defect in the anterior wall suggestive of possible ischemia (versus shifting breast attenuation) Transient Ischemic Dilatation:  1.13  (Normal <1.22)  Lung/Heart Ratio:  .25  (Normal <0.45)  Quantitative Gated Spect Images QGS EDV:  103 ml QGS ESV:  50 ml QGS EF:  52 % QGS cine images:  LV mildly dilated. normal function.  Findings Abnormal nuclear study Evidence for anterior (septal apical) ischemia      Overall Impression  Exercise Capacity: Good exercise capacity. BP Response: Hypertensive blood pressure response. Clinical Symptoms: No chest pain ECG Impression: Insignificant upsloping ST segment depression. Overall Impression: Abnormal stress nuclear study. Overall Impression Comments: Mild reversible defect in the anterior wall suggestive of possible ischemia (versus shifting breast attenuation)  Appended Document: Cardiology Nuclear Testing adv pt of results. Claris Gladden, RN, BSN

## 2010-06-17 NOTE — Progress Notes (Signed)
Summary: sinus?  Phone Note Call from Patient Call back at [facility] Phone [phone]   Caller: Patient Call For: nadel Reason for Call: Talk to Nurse Complaint: Headache, Nausea/Vomiting/Diarrhea Summary of Call: pt. went to ed last night.  Having chest pain and headache.  Chest pain resolved, but still has headache.  Facial pain, bridge of nose base of skull, Hydrocodone didn't help.  Feel it is sinus headache.  Can she get something called in? [phone] Initial call taken by: Eugene Gavia,  June 13, 2008 8:36 AM  Follow-up for Phone Call        per SN---ok for pt to have midrin  #50  1 by mouth every 4 hours as needed for ha---use mucinex 2 by mouth two times a day with lots of fluids and saline every 1-2 hours as needed .Marland KitchenMarland KitchenLMOMTCB---- Marijo File CMA  June 13, 2008 2:07 PM   Additional Follow-up for Phone Call Additional follow up Details #1::        pt called back and spoke with [facility]...she explained that the midrin had been called into her pharmacy---pt is also aware of taking the mucinex and the claritin with lots of fluids. Marijo File CMA  June 13, 2008 3:12 PM     New/Updated Medications: MIDRIN 325-65-100 MG CAPS (APAP-ISOMETHEPTENE-DICHLORAL) take one tablet by mouth every 4 hours as needed for headache MUCINEX D 60-600 MG XR12H-TAB (PSEUDOEPHEDRINE-GUAIFENESIN) take 2 tablets by mouth two times a day with lots of fluids   Prescriptions: MIDRIN 325-65-100 MG CAPS (APAP-ISOMETHEPTENE-DICHLORAL) take one tablet by mouth every 4 hours as needed for headache  #50 x 0   Entered by:   Marijo File CMA   Authorized by:   Michele Mcalpine MD   Signed by:   Tivis Ringer on 06/13/2008   Method used:   Telephoned to ...       [facility] [phone]* (retail)       [address]       [place], [place]  [address]       Ph: [phone] or [phone]       Fax: [phone]   RxID:   [id number]

## 2010-06-17 NOTE — Letter (Signed)
Summary: Appointment - Missed  [facility], Main Office  [address]   [place], [place] [address]   Phone: [phone]  Fax: [phone]     December 17, 2009 MRN: [id number]   JAMICIA HAALAND [address] [place], [place]  [address]   Dear Ms. Oleski,  Our records indicate you missed your appointment on    12-02-2009 with Dr.   Graciela Husbands. It is very important that we reach you to reschedule this appointment. We look forward to participating in your health care needs. Please contact us at the number listed above at your earliest convenience to reschedule this appointment.     Sincerely,    Glass blower/designer

## 2010-06-17 NOTE — Progress Notes (Signed)
Summary: whelps from ekg   Phone Note Call from Patient   Caller: Patient [phone] until 11a Reason for Call: Talk to Nurse Summary of Call: pt had ekg done, now she has whelps everyone the leads were placed, using benadryl but still itching, wanted to know what else she should do?  Initial call taken by: Glynda Jaeger,  February 27, 2010 8:45 AM  Follow-up for Phone Call        unable to reach pt  at work. left msg for pt at home. 02/27/10 11:00  returned call can be reached at [phone] [facility]  February 27, 2010 11:24 AM  Pt still itching three days post EKG. She started having this reaction  two days after having the EKG. She said that the redness/itching is only where the electrodes were. She is using a benadryl stick & so I advised her to take Benadryl by mouth and wait a few hours to see if this helps & to continue using the hydrocortisone cream.  Advised her to call 911 if she develops any SOB. She has had multiple EKGs before but stated that they cleaned her skin with something prior to placing the electrodes.Marland Kitchen?? Advised her to call us back with any further questions/problems. Whitney Maeola Sarah RN  February 28, 2010 8:58 AM  Follow-up by: Whitney Maeola Sarah RN,  February 28, 2010 8:58 AM  Additional Follow-up for Phone Call Additional follow up Details #1::        left msg for pt to try hydrocortisone cream and to cb w/questions.  PT CALLING BACK REGARDING USING HYDROCORTISONE CREAM IT;S NOT WORKING PT HAS BEEN UP ALL NIGHT PT REQUEST CALL BACK  [phone] Judie Grieve  February 28, 2010 8:19 AM Additional Follow-up by: Claris Gladden RN,  February 27, 2010 2:31 PM

## 2010-06-17 NOTE — Progress Notes (Signed)
Summary: Nuc Pre-Procedure  Phone Note Outgoing Call Call back at Home Phone [phone]   Call placed by: Cleon Gustin Call placed to: Patient Reason for Call: Confirm/change Appt Summary of Call: Left message with information on [employer] Information Sheet (see scanned document for details).      Nuclear Med Background Indications for Stress Test: Evaluation for Ischemia   History: Asthma, COPD, Echo, GXT  History Comments: Nl EF by Echo 2006 H/O AS defect and SA node dysfx.  Symptoms: Chest Pain, Chest Pain with Exertion, DOE, Fatigue, SOB  Symptoms Comments: CP with neck and arm radiation   Nuclear Pre-Procedure Cardiac Risk Factors: Hypertension Height (in): 68

## 2010-06-17 NOTE — Progress Notes (Signed)
Summary: headache  Phone Note Call from Patient Call back at [phone]   Caller: Patient Call For: nadel Summary of Call: pt has had headache sent tues . she has taken tramadol and she would like to know if she can talke hydrocodone and midrin? Initial call taken by: Rickard Patience,  August 30, 2008 8:37 AM  Follow-up for Phone Call        Called spoke with pt.  Pt states she hasn't taken any midrin or vicodin yet.  Pt states she has only taken 1 dose of Tramadol this am.  Advised pt to take Midrin since it is rx specifically for tx of pt's headaches.  Advised pt not to take vicodin and midrin together as both contain APAP in them. Advised pt per Midrin instructs can take every 4 hrs as needed for HA.  Advised pt if HA persists TCB.  Follow-up by: Cloyde Reams RN,  August 30, 2008 9:12 AM

## 2010-06-17 NOTE — Letter (Signed)
Summary: [facility]  [facility]   Imported By: Maryln Gottron 09/06/2008 13:57:08  _____________________________________________________________________  External Attachment:    Type:   Image     Comment:   External Document

## 2010-06-17 NOTE — Assessment & Plan Note (Signed)
Summary: Right side head pain/blurry vision//kcw   Chief Complaint:  blurred vision and right sided head pain since last night.  History of Present Illness: 45 yo AAF of Dr. Kriste Basque with known history of HTN and RA present acute office visit. Complains of sore spot on right posterior scalp, sore to lie on and tender to touch. No known injury. Denies chest pain, dyspnea, orthopnea, hemoptysis, fever, n/v/d, edema, headache. Complains of intermittent blurry vision and decrease vision. Has upcoming opthamology visit. No double vision or loss of vision.    Current Allergies (reviewed today): No known allergies  Updated/Current Medications (including changes made in today's visit):  IMIPRAMINE HCL 25 MG  TABS (IMIPRAMINE HCL) take 1 at bedtime FEXOFENADINE HCL 180 MG  TABS (FEXOFENADINE HCL) take 1 by mouth once daily PREVACID 30 MG  CPDR (LANSOPRAZOLE) take 1 by mouth once daily CYCLOBENZAPRINE HCL 10 MG  TABS (CYCLOBENZAPRINE HCL) take 1 by mouth at bedtime METOCLOPRAMIDE HCL 10 MG  TABS (METOCLOPRAMIDE HCL) take 1 by mouth three times a day NORVASC 5 MG  TABS (AMLODIPINE BESYLATE) take 1 by mouth once daily NASONEX 50 MCG/ACT  SUSP (MOMETASONE FUROATE) 2 sprays at bedtime BENEFIBER   TABS (WHEAT DEXTRIN) take 1 by mouth once daily ALBUTEROL 90 MCG/ACT  AERS (ALBUTEROL) as needed * MVI once daily SYNTHROID 112 MCG  TABS (LEVOTHYROXINE SODIUM) take 1 by mouth once daily PLAQUENIL 200 MG  TABS (HYDROXYCHLOROQUINE SULFATE) take 1 by mouth three times a day LYRICA 50 MG  CAPS (PREGABALIN) take 1 by mouth three times a day METHOTREXATE 2.5 MG  TABS (METHOTREXATE SODIUM) take 3 every week * FOLIC ACID 1MG  2 by mouth once daily IMITREX 100 MG  TABS (SUMATRIPTAN SUCCINATE) take as needed for headaches as directed METOCLOPRAMIDE HCL 10 MG  TABS (METOCLOPRAMIDE HCL) take as needed for headaches as directed TRAMADOL HCL 50 MG  TABS (TRAMADOL HCL) take 1 every 4 hours as needed HYOSCYAMINE SULFATE  0.125 MG  TABS (HYOSCYAMINE SULFATE) take 1 every 4 hours as needed ALBUTEROL 90 MCG/ACT  AERS (ALBUTEROL) every 4-6 hours as needed * CRANTEX LA 1 two times a day as needed * SALINE NASAL SPRAY every 4 hours as needed MIRALAX   POWD (POLYETHYLENE GLYCOL 3350) once daily KEFLEX 500 MG  CAPS (CEPHALEXIN) 1 by mouth every 6 hrs for 7 days     Risk Factors:  Tobacco use:  never   Review of Systems      See HPI   Vital Signs:  Patient Profile:   45 Years Old Female Weight:      135.4 pounds O2 Sat:      100 % O2 treatment:    Room Air Temp:     97.5 degrees F oral Pulse rate:   42 / minute BP sitting:   106 / 62  (left arm)  Vitals Entered By: Michel Bickers CMA (May 27, 2007 12:09 PM)             Is Patient Diabetic? No     Physical Exam  No acute distress. VSS.  HEENT:Oropharynx is clear. TM nml, EAC clear, along the right mid posterior scalp is small soft cytic lesion with surrounding redness. tender to touch.  Neck: supple, no adenopathy, or JVD Lungs: CTA bilat. no wheezing/crackles Card: r/r/r, no m/r/g Abd: soft, nontender Ext: warm w/o calf tenderness, cyanosis clubbing, or edema.       Impression & Recommendations:  Problem # 1:  SEBACEOUS CYST, SCALP (ICD-706.2)  Probable Infected sebaceous cyst. Begin Keflex 500mg  q6 for 7 days. Warm compresses to area. Return in 1 month with Dr. Kriste Basque and as needed. Please contact office for sooner follow up if symptoms do not improve or worsen  Watch for signs and symptoms of infection.  Orders: Est. Patient Level III (04540)   Medications Added to Medication List This Visit: 1)  Miralax Powd (Polyethylene glycol 3350) .... Once daily 2)  Keflex 500 Mg Caps (Cephalexin) .Marland Kitchen.. 1 by mouth every 6 hrs for 7 days   Patient Instructions: 1)  Please schedule a follow-up appointment in 1 month Dr. Kriste Basque  2)  Please contact office for sooner follow up if symptoms do not improve or worsen  3)  Finish antibiotics.  4)   Warm compresses to area    Prescriptions: KEFLEX 500 MG  CAPS (CEPHALEXIN) 1 by mouth every 6 hrs for 7 days  #28 x 0   Entered and Authorized by:   Rubye Oaks NP   Signed by:   Tammy Parrett NP on 05/27/2007   Method used:   Electronically sent to ...       [id number]       [address]       [place], [place]  [address]       Ph: [phone] or [phone]       Fax: [phone]   RxID:   [id number]  ]

## 2010-06-17 NOTE — Progress Notes (Signed)
Summary: levothyroxine rx  Phone Note Call from Patient   Caller: Patient Call For: nadel Summary of Call: pt went to pharmacy and no rx had been called in for thyroid meds. [facility] [place] [phone]. pt # [id number]. NOTE: i see that rx was printed out and given to pt. called pt back. she says she doesn't have it and requests this be called in pls.  Initial call taken by: Tivis Ringer, CNA,  January 29, 2010 10:38 AM  Follow-up for Phone Call        rx sent. pt aware. [name] CMA  January 29, 2010 11:22 AM     Prescriptions: LEVOTHYROXINE SODIUM 75 MCG TABS (LEVOTHYROXINE SODIUM) take 1 tab by mouth once daily...  #30 x 12   Entered by:   [name] CMA   Authorized by:   Michele Mcalpine MD   Signed by:   [name] CMA on 01/29/2010   Method used:   Electronically to        [facility] [phone][facility] (retail)       [address]       [place], [place]  [address]       Ph: [phone] or [phone]       Fax: [phone]   RxID:   [id number]

## 2010-06-17 NOTE — Progress Notes (Signed)
Summary: refill req/ bp meds  Phone Note Call from Patient   Caller: Patient Call For: nadel Summary of Call: pt req refill of norvasc for bp. says [facility] [address] [facility] [place] told her to call and req. i asked hwer to call them and get them to send a fax as well.  Initial call taken by: Tivis Ringer,  September 07, 2007 10:38 AM  Follow-up for Phone Call        Pt aware we [facility]. Follow-up by: Reynaldo Minium CMA,  September 07, 2007 10:46 AM      Prescriptions: NORVASC 5 MG  TABS (AMLODIPINE BESYLATE) take 1 by mouth once daily  #30 x 11   Entered by:   Reynaldo Minium CMA   Authorized by:   Michele Mcalpine MD   Signed by:   Reynaldo Minium CMA on 09/07/2007   Method used:   Electronically sent to ...       [id number]       [address] [place], [place]  [address]       Ph: [phone] or [phone]       Fax: [phone]   RxID:   [id number]

## 2010-06-17 NOTE — Consult Note (Signed)
[facility]   Imported By: Esmeralda Links D'jimraou 08/18/2007 13:40:13  _____________________________________________________________________  External Attachment:    Type:   Image     Comment:   External Document

## 2010-06-17 NOTE — Consult Note (Signed)
[facility]   Imported By: Sherian Rein 03/21/2010 11:38:55  _____________________________________________________________________  External Attachment:    Type:   Image     Comment:   External Document

## 2010-06-17 NOTE — Assessment & Plan Note (Signed)
Summary: HFU////KP   Chief Complaint:  hosp follow up at [facility] - still having headaches.  History of Present Illness: 45 year old BF added on today for complaints of recurrent headache. She has a hx of RA and Fibromyalgia and is followed by [facility] for rheumatology on Prednisone, Plaquenil, Lyrica, Imipramine, Flexeril, Tramadol, and Vicodin... she presents w/ a 1 week of pain in the post. scalp left side. Went to ER [employer], CT head negative. Denies ext. weakness, visual  changes, photosensitivity, n/v, or chest pain.          Prior Medication List:  PROAIR HFA 108 (90 BASE) MCG/ACT  AERS (ALBUTEROL SULFATE) 1-2 inhalations QID as needed for wheezing... NASONEX 50 MCG/ACT  SUSP (MOMETASONE FUROATE) 2 sprays in each nostril at bedtime FEXOFENADINE HCL 180 MG  TABS (FEXOFENADINE HCL) take 1 by mouth once daily for allergies... NORVASC 5 MG  TABS (AMLODIPINE BESYLATE) take 1 by mouth once daily NEXIUM 40 MG  CPDR (ESOMEPRAZOLE MAGNESIUM) Take 1 tablet by mouth once a day METOCLOPRAMIDE HCL 10 MG  TABS (METOCLOPRAMIDE HCL) take as directed... HYOSCYAMINE SULFATE 0.125 MG  TABS (HYOSCYAMINE SULFATE) take 1 every 4 hours as needed MIRALAX   POWD (POLYETHYLENE GLYCOL 3350) 1 capful in water once or twice daily as directed... SYNTHROID 137 MCG  TABS (LEVOTHYROXINE SODIUM) Take 1 tablet by mouth once a day PLAQUENIL 200 MG  TABS (HYDROXYCHLOROQUINE SULFATE) take one tab daily as directed by DrDeveshwar... LYRICA 50 MG  CAPS (PREGABALIN) Take one tab by mouth three times a day as directed... PREDNISONE 5 MG  TABS (PREDNISONE) take as directed by DrDeveshwar... CENTRUM   TABS (MULTIPLE VITAMINS-MINERALS) Take 1 tablet by mouth once a day FOLIC ACID 1 MG  TABS (FOLIC ACID) take one tab by mouth two times a day as directed... TRAMADOL HCL 50 MG  TABS (TRAMADOL HCL) take 1 every 4 hours as needed HYDROCODONE-ACETAMINOPHEN 5-500 MG  TABS (HYDROCODONE-ACETAMINOPHEN) take one tab  every 6-8 H as needed for pain... IMIPRAMINE HCL 25 MG  TABS (IMIPRAMINE HCL) take 1-2 tabs by mouth at bedtime as needed for sleep... CYCLOBENZAPRINE HCL 10 MG  TABS (CYCLOBENZAPRINE HCL) take 1-2 tabs by mouth at bedtime as needed for sleep...   Current Allergies (reviewed today): No known allergies   Past Medical History:    Reviewed history from 08/03/2007 and no changes required:       Allergic Rhinitis  on ALLEGRA 180mg /d & NASONEX 2spQhs.       ASTHMA (ICD-493.90) -  on PROAIR as needed.              HYPERTENSION (ICD-401.9) - controlled on Norvasc 5mg /d... tol well and denies HA, visual changes, CP, palipit, dizziness, syncope, dyspnea, edema, etc... BP=120/70 today despite her neck discomfort.       Hx of ATRIAL SEPTAL DEFECT (ICD-745.5) - S/P ASD repair 1985... she has seen DrHochrein & DrKlein  in the past... last 2DEcho 7/06 showed tr MR & TR, normal LVF.Marland KitchenMarland Kitchen              HYPOTHYROIDISM (ICD-244.9) - on Synthroid 151mcg/d... last TSH here was 1.32 in 2007, she gets labs regularly by [facility]...              GERD (ICD-530.81)       IRRITABLE BOWEL SYNDROME (ICD-564.1)          << GI - followed by DrWalker in [place]  >>       CONSTIPATION (ICD-564.00)  Chronic Headaches prev eval with Headache clinic, dx [facility] prev on Imitrex.        RHEUMATOID ARTHRITIS (ICD-714.0)       FIBROMYALGIA (ICD-729.1)    <<  Rheum -  followed by DrDeveshwar  >>       LOW BACK PAIN (ICD-724.2)              ANXIETY (ICD-300.00)        ANEMIA (ICD-285.9) - Hg =11.5 range last yr.       SEBACEOUS CYST, SCALP (ICD-706.2) - this resolved w/ Keflex... she has a hx of MRSA exposure from her child and had a MRSA buttock abscess draine   Family History:    Reviewed history and no changes required:  Social History:    Reviewed history and no changes required:   Risk Factors: Tobacco use:  never   Review of Systems      See HPI   Vital Signs:  Patient Profile:   45 Years Old  Female Weight:      135.38 pounds O2 Sat:      98 % O2 treatment:    Room Air Temp:     97.6 degrees F oral Pulse rate:   64 / minute BP sitting:   118 / 74  (left arm) Cuff size:   regular  Vitals Entered By: Boone Master CNA (September 09, 2007 10:19 AM)             Is Patient Diabetic? No Comments Medications reviewed with patient ..................................................................Marland KitchenBoone Master CNA  September 09, 2007 10:19 AM      Physical Exam  WD, Thin, [name] in NAD... GENERAL:  Alert & oriented; pleasant & cooperative. HEENT:  [place]/AT, EOM-wnl, PERRLA, EACs-clear, TMs-wnl, NOSE-clear, THROAT-clear & wnl. NECK:  Tender post neck area to right, +triggers in trapezius, decr ROM neck, neuromusc intact w/ norm DTR's & strength;  no JVD; normal carotid impulses w/o bruits; no thyromegaly or nodules palpated; no lymphadenopathy. CHEST:  Clear to P & A; without wheezes/ rales/ or rhonchi. HEART:  Regular Rhythm;  gr 1-2 SEM without rubs or gallops... ABDOMEN:  Soft & nontender; normal bowel sounds; no organomegaly or masses detected. EXT: without deformities, mild arthritic changes; no varicose veins/ venous insuffic/ or edema. NEURO:  CN's intact; motor testing normal; sensory testing normal; gait normal & balance OK. DERM:  No lesions noted; no rash etc...       Impression & Recommendations:  Problem # 1:  HEADACHE, CHRONIC (ICD-784.0) Suspect is tension headache. Warm heat to back neck as needed  Midrin as needed 4 hr for headache stress reducers Headache calendar Please contact office for sooner follow up if symptoms do not improve or worsen  Her updated medication list for this problem includes:    Tramadol Hcl 50 Mg Tabs (Tramadol hcl) .Marland Kitchen... Take 1 every 4 hours as needed    Hydrocodone-acetaminophen 5-500 Mg Tabs (Hydrocodone-acetaminophen) .Marland Kitchen... Take one tab every 6-8 h as needed for pain...    Midrin 325-65-100 Mg Caps  (Apap-isometheptene-dichloral) .Marland Kitchen... 1 by mouth every 4 hr as needed headache Headache diary reviewed.  Orders: Est. Patient Level III (81191)   Medications Added to Medication List This Visit: 1)  Midrin 325-65-100 Mg Caps (Apap-isometheptene-dichloral) .Marland Kitchen.. 1 by mouth every 4 hr as needed headache   Patient Instructions: 1)  Warm heat to back neck as needed  2)  Midrin as needed 4 hr for headache 3)  stress reducers 4)  Headache calendar  5)  Please contact office for sooner follow up if symptoms do not improve or worsen     Prescriptions: MIDRIN 325-65-100 MG  CAPS (APAP-ISOMETHEPTENE-DICHLORAL) 1 by mouth every 4 hr as needed headache  #30 x 1   Entered and Authorized by:   Rubye Oaks NP   Signed by:   Tammy Parrett NP on 09/09/2007   Method used:   Electronically sent to ...       [id number]       [address] [place], [place]  [address]       Ph: [phone] or [phone]       Fax: [phone]   RxID:   [id number]  ][Headache Q&E-CCC]

## 2010-06-17 NOTE — Consult Note (Signed)
Summary: hypothyroidism/[facility]  hypothyroidism/[facility]   Imported By: Lester [place] 03/08/2008 10:25:58  _____________________________________________________________________  External Attachment:    Type:   Image     Comment:   External Document

## 2010-06-17 NOTE — Assessment & Plan Note (Signed)
Summary: rov/ gd  Medications Added OMEPRAZOLE 40 MG CPDR (OMEPRAZOLE) 1 tab by mouth two times a day PROMETHAZINE HCL 25 MG TABS (PROMETHAZINE HCL) as needed TOPIRAMATE 25 MG TABS (TOPIRAMATE) 3 tabs by mouth once daily TIZANIDINE HCL 4 MG TABS (TIZANIDINE HCL) as needed        Primary Provider:  nadel  CC:  chest pains pt thinks its related to arthrits.Marland KitchenMarland KitchenSeverity of pain 4 .  History of Present Illness: Brittany Hodges is seen in followup for junctional rhythm in the setting of atrial septal defect repair. In the past she has had assessments of contra competence which have been adequate. However, she is now having more problems with exertional shortness of breath and fatigue. Resting heart rates have been identified in the low 40s. She's also had complaints of chest pain associated with exertion with radiation to her neck and arm.  her TSH was noted to be mildly elevated; levothyroxine dose was increased.  Cholesterol studies were not done at her labs last month.   .   Current Medications (verified): 1)  Simply Saline 0.9 % Aers (Saline) .Marland Kitchen.. 1-2 Sprays Each Nostril As Needed 2)  Nasonex 50 Mcg/act  Susp (Mometasone Furoate) .... 2 Sprays in Each Nostril At Bedtime 3)  Proair Hfa 108 (90 Base) Mcg/act  Aers (Albuterol Sulfate) .Marland Kitchen.. 1-2 Inhalations Qid As Needed For Wheezing... 4)  Centrum   Tabs (Multiple Vitamins-Minerals) .... Take 1 Tablet By Mouth Once A Day 5)  Vitamin D 1000 Unit Tabs (Cholecalciferol) .... Take One Tablet By Mouth Once Daily 6)  Levothyroxine Sodium 75 Mcg Tabs (Levothyroxine Sodium) .... Take 1 Tab By Mouth Once Daily.Marland KitchenMarland Kitchen 7)  Omeprazole 40 Mg Cpdr (Omeprazole) .Marland Kitchen.. 1 Tab By Mouth Two Times A Day 8)  Promethazine Hcl 25 Mg Tabs (Promethazine Hcl) .... As Needed 9)  Topiramate 25 Mg Tabs (Topiramate) .... 3 Tabs By Mouth Once Daily 10)  Tizanidine Hcl 4 Mg Tabs (Tizanidine Hcl) .... As Needed  Allergies: No Known Drug Allergies  Past History:  Past Medical  History: Last updated: 01/22/2010 ALLERGIC RHINITIS (ICD-477.9) ASTHMA (ICD-493.90) HYPERTENSION (ICD-401.9) Hx of ATRIAL SEPTAL DEFECT (ICD-745.5) SINOATRIAL NODE DYSFUNCTION (ICD-427.81) HYPOTHYROIDISM (ICD-244.9) GERD (ICD-530.81) IRRITABLE BOWEL SYNDROME (ICD-564.1) CONSTIPATION (ICD-564.00) RHEUMATOID ARTHRITIS (ICD-714.0) FIBROMYALGIA (ICD-729.1) LOW BACK PAIN (ICD-724.2) HEADACHE, CHRONIC (ICD-784.0) ANXIETY (ICD-300.00) ANEMIA (ICD-285.9) SEBACEOUS CYST, SCALP (ICD-706.2)  Past Surgical History: Last updated: 01/22/2010 tubal ligation ASD repair  Family History: Last updated: 01/22/2010 Positive for RA, hypertension, and diabetes  Social History: Last updated: 08/03/2008 Tobacco Use - No.  Alcohol Use - no Drug Use - no  Vital Signs:  Patient profile:   45 year old female Height:      68 inches Weight:      140 pounds BMI:     21.36 Pulse rate:   40 / minute Resp:     14 per minute BP sitting:   171 / 89  (left arm)  Vitals Entered By: Kem Parkinson (February 14, 2010 9:37 AM)  Physical Exam  General:  The patient was alert and oriented in no acute distress. HEENT Normal.  Neck veins were flat, carotids were brisk.  Lungs were clear.  Heart sounds were regular but slowwithout murmurs or gallops.  Abdomen was soft with active bowel sounds. There is no clubbing cyanosis or edema. Skin Warm and dry    Impression & Recommendations:  Problem # 1:  SINOATRIAL NODE DYSFUNCTION (ICD-427.81) the patient appears to be having worsening of her sinus though  dysfunction and likely development of chronotropic incompetence. She is also having significant chest discomfort. Given the bradycardia standard stress testing will likely achieve an adequate heart rate. Will undertake Myoview scanning.   Orders: EKG w/ Interpretation (93000) Nuclear Stress Test (Nuc Stress Test)  Problem # 2:  Hx of ATRIAL SEPTAL DEFECT (ICD-745.5) stable Orders: EKG w/  Interpretation (93000)  Problem # 3:  CHEST PAIN (ICD-786.50) history of chest pain with typical and atypical features. Cardiac risk factors include hypertension.  We will need to check her lipids and will undertake Myoview scanning as noted above  Patient Instructions: 1)  Your physician recommends that you continue on your current medications as directed. Please refer to the Current Medication list given to you today. 2)  Your physician has requested that you have an exercise stress myoview.  For further information please visit [email or web address]  Please follow instruction sheet, as given.

## 2010-06-17 NOTE — Letter (Signed)
Summary: [facility]   Imported By: Lennie Odor 04/03/2010 15:17:01  _____________________________________________________________________  External Attachment:    Type:   Image     Comment:   External Document

## 2010-06-17 NOTE — Assessment & Plan Note (Signed)
Summary: rov/jd   Primary Care Provider:  nadel  CC:  14 month ROV & review of mult medical problems....  History of Present Illness: [name] here for a follow up visit...   ~  3/09:  c/o neck pain... she has a hx of RA and Fibromyalgia and is followed by [facility] for rheumatology on Prednisone, Plaquenil, Lyrica, Imipramine, Flexeril, Tramadol, and Vicodin... presents w/ 4d hx of pain in the neck, esp on the right posterior neck area w/ decr ROM, tenderness to palpation, and trigger points in her trapezius region... she denies crepitus w/ rotation of head, pain down the arms, weakness in the arms, etc... CSpine films from 8/03 are avail in [place] and showed mild scoliosis and kyphosis, & mild disc sp narrowing C4-5... Rx w/ rest/ heat/ Medrol/ Flexeril/ etc.   ~  November 08, 2008:  she has been followed over the last year by [employer], DrKerr, and DrKlein... returns today w/ c/o headaches that occur monthly just after onset on menses- frontal/ temples/ and posterior band-like pain> likely mixed muscle contraction/ migraine headaches... she has Midrin & this helps the pain when she takes it... ** we discussed starting the Midrin 1-2/d w/ the onset of menses to prevent the onset of these headaches- she will let me know how this works.   ~  January 22, 2010:  14 month ROV- she's been OOW x several yrs and unemployment ran out,  just got new job & here to re-establish> she is off all prev meds (see below) & only taking MVI, Vit D, & Dexilant samples from [place] [facility]... she was seen by ENT in [place] recently w/ throat symptoms related to reflux & Dexilant started;  also found to have TMJ problem, and c/o sinus congestion> we discussed Saline, Nasonex, Breathe-right nasal strips Qhs... non-fasting labs done today.   Current Problems:   ALLERGIC RHINITIS (ICD-477.9) - on OTC Antihist + Saline & NASONEX 2spQhs... rec to use breathe right nasal strips at bedtime Prn...  ~  9/11:  she reports that recent  ENT eval in [place] showed reflux related ENT symptoms & Rx w/ Dexilant.  ASTHMA (ICD-493.90) -  uses PROAIR as needed... denies cough, sputum, hemoptysis, worsening dyspnea,  wheezing, chest pains, snoring, daytime hypersomnolence, etc...   HYPERTENSION (ICD-401.9) - controlled on low salt diet alone (prev on Norvasc)... denies visual changes, CP, palipit, dizziness, syncope, dyspnea, edema, etc... BP= 112/78 today & doing satis w/o medication now...  Hx of ATRIAL SEPTAL DEFECT (ICD-745.5) - S/P ASD repair 1985... she has seen DrHochrein & DrKlein  in the past... last 2DEcho 7/06 showed tr MR & TR, normal LVF...  ~  Cards f/u 3/10 w/ DrKlein for hx SA node dysfunction- doing OK/ stable rhythm/ good exerc capacity.  HYPOTHYROIDISM (ICD-244.9) - prev on Synthroid 46mcg/d per DrKerr, but she has been out of meds for a yr she say... last TSH here was 1.32 in 2007.  ~  9/09: note from DrKerr reviewed- she had been off Synthroid & TSH was 13.14 w/ Synthroid restarted.  ~  labs here 9/11 off meds showed TSH= 7.79... rec> start SYNTHROID 19mcg/d.  GERD (ICD-530.81) IRRITABLE BOWEL SYNDROME (ICD-564.1)   << GI - followed by DrWalker in [place]  >> CONSTIPATION (ICD-564.00)  RHEUMATOID ARTHRITIS (ICD-714.0)   <<  Rheum -  followed by DrDeveshwar  >> FIBROMYALGIA (ICD-729.1)                  prev on Pred, MTX, Humira,  Enbrel - off all meds since 2010 LOW BACK PAIN (ICD-724.2)  ANXIETY (ICD-300.00) - not currently taking medication.  ANEMIA (ICD-285.9)  ~  labs 7/09 from [place] showed Hg= 12.4  ~  labs 9/11 here showed Hg= 13.0  SEBACEOUS CYST, SCALP (ICD-706.2) - this resolved w/ Keflex... she has a hx of MRSA exposure from her child and had a MRSA buttock abscess drained by Trecia Rogers 9/08...   Preventive Screening-Counseling & Management  Alcohol-Tobacco     Smoking Status: never  Allergies (verified): No Known Drug Allergies  Comments:  Nurse/Medical Assistant: The patient's medications and  allergies were reviewed with the patient and were updated in the Medication and Allergy Lists.  Past History:  Past Medical History: ALLERGIC RHINITIS (ICD-477.9) ASTHMA (ICD-493.90) HYPERTENSION (ICD-401.9) Hx of ATRIAL SEPTAL DEFECT (ICD-745.5) SINOATRIAL NODE DYSFUNCTION (ICD-427.81) HYPOTHYROIDISM (ICD-244.9) GERD (ICD-530.81) IRRITABLE BOWEL SYNDROME (ICD-564.1) CONSTIPATION (ICD-564.00) RHEUMATOID ARTHRITIS (ICD-714.0) FIBROMYALGIA (ICD-729.1) LOW BACK PAIN (ICD-724.2) HEADACHE, CHRONIC (ICD-784.0) ANXIETY (ICD-300.00) ANEMIA (ICD-285.9) SEBACEOUS CYST, SCALP (ICD-706.2)  Past Surgical History: tubal ligation ASD repair  Family History: Reviewed history from 08/03/2008 and no changes required. Positive for RA, hypertension, and diabetes  Social History: Reviewed history from 08/03/2008 and no changes required. Tobacco Use - No.  Alcohol Use - no Drug Use - no  Review of Systems       The patient complains of nasal congestion, sore throat, dyspnea on exertion, indigestion/heartburn, joint pain, stiffness, and frequent headaches.  The patient denies fever, chills, sweats, anorexia, fatigue, weakness, malaise, weight loss, sleep disorder, blurring, diplopia, eye irritation, eye discharge, vision loss, eye pain, photophobia, earache, ear discharge, tinnitus, decreased hearing, nosebleeds, hoarseness, chest pain, palpitations, syncope, orthopnea, PND, peripheral edema, cough, dyspnea at rest, excessive sputum, hemoptysis, wheezing, pleurisy, nausea, vomiting, diarrhea, constipation, change in bowel habits, abdominal pain, melena, hematochezia, jaundice, gas/bloating, dysphagia, odynophagia, dysuria, hematuria, urinary frequency, urinary hesitancy, nocturia, incontinence, back pain, joint swelling, muscle cramps, muscle weakness, arthritis, sciatica, restless legs, leg pain at night, leg pain with exertion, rash, itching, dryness, suspicious lesions, paralysis, paresthesias,  seizures, tremors, vertigo, transient blindness, frequent falls, difficulty walking, depression, anxiety, memory loss, confusion, cold intolerance, heat intolerance, polydipsia, polyphagia, polyuria, unusual weight change, abnormal bruising, bleeding, enlarged lymph nodes, urticaria, allergic rash, hay fever, and recurrent infections.    Vital Signs:  Patient profile:   45 year old female Height:      68 inches Weight:      142.13 pounds BMI:     21.69 O2 Sat:      98 % on Room air Temp:     97.4 degrees F oral Pulse rate:   45 / minute BP sitting:   112 / 78  (right arm) Cuff size:   regular  Vitals Entered By: Randell Loop CMA (January 22, 2010 2:20 PM)  O2 Sat at Rest %:  98 O2 Flow:  Room air CC: 14 month ROV & review of mult medical problems... Is Patient Diabetic? No Pain Assessment Patient in pain? no      Comments meds updated today with pt   Physical Exam  Additional Exam:  WD, Thin, 44 y/o BF in NAD... GENERAL:  Alert & oriented; pleasant & cooperative... HEENT:  [place]/AT, EOM-wnl, PERRLA, EACs-clear, TMs-wnl, NOSE- congested, clear discharge; THROAT-clear & wnl. NECK: no JVD; normal carotid impulses w/o bruits; no thyromegaly or nodules palpated; no lymphadenopathy. CHEST:  Clear to P & A; without wheezes/ rales/ or rhonchi. HEART:  Regular Rhythm;  gr 1-2 SEM without rubs  or gallops... ABDOMEN:  Soft & nontender; normal bowel sounds; no organomegaly or masses detected. EXT: without deformities, mild arthritic changes; no varicose veins/ venous insuffic/ or edema. NEURO:  CNs intact; no focal neuro deficits... DERM:  No lesions noted; no rash etc...    MISC. Report  Procedure date:  01/22/2010  Findings:      BMP (METABOL)   Sodium                    138 mEq/L                   135-145   Potassium                 3.9 mEq/L                   3.5-5.1   Chloride                  102 mEq/L                   96-112   Carbon Dioxide            29 mEq/L                     19-32   Glucose                   79 mg/dL                    16-10   BUN                       10 mg/dL                    9-60   Creatinine                0.8 mg/dL                   4.5-4.0   Calcium                   9.1 mg/dL                   9.8-11.9   GFR                       100.70 mL/min               >60   Hepatic/Liver Function Panel (HEPATIC)   Total Bilirubin           0.8 mg/dL                   1.4-7.8   Direct Bilirubin          0.1 mg/dL                   2.9-5.6   Alkaline Phosphatase      46 U/L                      39-117   AST                       27 U/L                      0-37   ALT  18 U/L                      0-35   Total Protein             8.0 g/dL                    1.6-1.0   Albumin                   4.0 g/dL                    9.6-0.4  CBC Platelet w/Diff (CBCD)   White Cell Count          4.8 K/uL                    4.5-10.5   Red Cell Count            4.32 Mil/uL                 3.87-5.11   Hemoglobin                13.0 g/dL                   54.0-98.1   Hematocrit                38.6 %                      36.0-46.0   MCV                       89.4 fl                     78.0-100.0   Platelet Count            150.0 K/uL                  150.0-400.0   Neutrophil %              56.2 %                      43.0-77.0   Lymphocyte %              30.4 %                      12.0-46.0   Monocyte %           [H]  12.5 %                      3.0-12.0   Eosinophils%              0.6 %                       0.0-5.0   Basophils %               0.3 %                       0.0-3.0  TSH (TSH)   FastTSH              [H]  7.79 uIU/mL                 0.35-5.50  Tests: (5) Sed Rate (ESR)   Sed  Rate                  22 mm/hr    Impression & Recommendations:  Problem # 1:  ALLERGIC RHINITIS (ICD-477.9) We discussed OTC meds> Antihist, Saline, & breathe right nasal strips Qhs... given Nasonex sample to 1-2 sp Qhs as well. Her  updated medication list for this problem includes:    Simply Saline 0.9 % Aers (Saline) .Marland Kitchen... 1-2 sprays each nostril as needed    Nasonex 50 Mcg/act Susp (Mometasone furoate) .Marland Kitchen... 2 sprays in each nostril at bedtime  Problem # 2:  ASTHMA (ICD-493.90) Stable doing well w/o need for rescue inhaler... given Proair for Prn use... Her updated medication list for this problem includes:    Proair Hfa 108 (90 Base) Mcg/act Aers (Albuterol sulfate) .Marland Kitchen... 1-2 inhalations qid as needed for wheezing...  Problem # 3:  Hx of ATRIAL SEPTAL DEFECT (ICD-745.5) Aware- no cardiac symptoms & followed by DrHochrein & DrKlein in the past...  Problem # 4:  HYPOTHYROIDISM (ICD-244.9) She stopped all meds  ~64yr ago & f/u labs today showed TSH= 7.79, therefore start Synthroid 10mcg/d... The following medications were removed from the medication list:    Levothyroxine Sodium 88 Mcg Tabs (Levothyroxine sodium) .Marland Kitchen... 1 once daily Her updated medication list for this problem includes:    Levothyroxine Sodium 75 Mcg Tabs (Levothyroxine sodium) .Marland Kitchen... Take 1 tab by mouth once daily...  Orders: TLB-BMP (Basic Metabolic Panel-BMET) (80048-METABOL) TLB-Hepatic/Liver Function Pnl (80076-HEPATIC) TLB-CBC Platelet - w/Differential (85025-CBCD) TLB-TSH (Thyroid Stimulating Hormone) (84443-TSH) TLB-Sedimentation Rate (ESR) (85652-ESR)  Problem # 5:  GERD (ICD-530.81) She was given PPI Rx (Dexilant samples and ?OMEP perscription) by ENT & GI in [place]...  Problem # 6:  RHEUMATOID ARTHRITIS (ICD-714.0) Followed by [name] for RA & FM... she will call to re-establish...  Problem # 7:  HEADACHE, CHRONIC (ICD-784.0) Hx migraines per [facility]Freeman at [facility] & prev Rx w/ Topamax & Baclofen... off all meds  ~12yr & using OTC Tylenol etc... The following medications were removed from the medication list:    Tramadol Hcl 50 Mg Tabs (Tramadol hcl) .Marland Kitchen... Take 1 every 4 hours as needed    Hydrocodone-acetaminophen 5-500  Mg Tabs (Hydrocodone-acetaminophen) .Marland Kitchen... Take one tab every 6-8 h as needed for pain...  Problem # 8:  OTHER MEDICAL PROBLEMS AS NOTED>>> See labs done today...  Complete Medication List: 1)  Simply Saline 0.9 % Aers (Saline) .Marland Kitchen.. 1-2 sprays each nostril as needed 2)  Nasonex 50 Mcg/act Susp (Mometasone furoate) .... 2 sprays in each nostril at bedtime 3)  Proair Hfa 108 (90 Base) Mcg/act Aers (Albuterol sulfate) .Marland Kitchen.. 1-2 inhalations qid as needed for wheezing... 4)  Centrum Tabs (Multiple vitamins-minerals) .... Take 1 tablet by mouth once a day 5)  Vitamin D 1000 Unit Tabs (Cholecalciferol) .... Take one tablet by mouth once daily 6)  Levothyroxine Sodium 75 Mcg Tabs (Levothyroxine sodium) .... Take 1 tab by mouth once daily...  Patient Instructions: 1)  Today we updated your med list- see below.... 2)  Sarann, you have done a great job getting off of so many medications... now we need to recheck and see which ones are important to restart based on your labs... 3)  Today we did your follow up blood work... please call the "phone tree" in a few days for your lab results.Marland KitchenMarland Kitchen 4)  You need a follow up w/ drDeveshwar at your convenience & we will send her a copy of your labs.Marland KitchenMarland Kitchen 5)  Call for any questions.Marland KitchenMarland Kitchen 6)  Please schedule a follow-up appointment in 6 months. Prescriptions: LEVOTHYROXINE SODIUM 75 MCG TABS (LEVOTHYROXINE SODIUM) take 1 tab by mouth once daily...  #30 x 12   Entered and Authorized by:   Michele Mcalpine MD   Signed by:   Michele Mcalpine MD on 01/25/2010   Method used:   Print then Give to Patient   RxID:   [id number]     Appended Document: rov/jd "Phone Tree" message recorded... SN TSH is up off meds for 51yr she says... REC> start SYNTHROID 15mcg/d.Marland KitchenMarland Kitchen

## 2010-06-17 NOTE — Progress Notes (Signed)
Summary: speak to rn  Phone Note Call from Patient Call back at Home Phone [phone]   Caller: Patient Reason for Call: Talk to Nurse Summary of Call: pt called wanting to speak to nurse. one side of her head hurts and has had blurry vision all week. Initial call taken by: Lehman Prom,  May 27, 2007 8:37 AM  Follow-up for Phone Call        Spoke with pt; right side head pain with any movement; blurry vision. Never had this before. Pt does have high blood pressure. Made appt with Tammy Parrett this am for this. Follow-up by: Reynaldo Minium CMA,  May 27, 2007 9:12 AM

## 2010-06-17 NOTE — Assessment & Plan Note (Signed)
Summary: pain in neck/ mbw   Chief Complaint:  Add-on visit for neck pain....  History of Present Illness: [name] added on today for complaints of neck pain... she has a hx of RA and Fibromyalgia and is followed by [facility] for rheumatology on Prednisone, Plaquenil, Lyrica, Imipramine, Flexeril, Tramadol, and Vicodin... she presents w/ a 4d hx of pain in the neck, esp on the right posterior neck area w/ decr ROM, tenderness to palpation, and trigger points in her trapezius region... she denies crepitus w/ rotation of head, pain down the arms, weakness in the arms, etc... CSpine films from 8/03 are avail in [place] and showed mild scoliosis and kyphosis, & mild disc sp narrowing C4-5...   Current Problems:  ALLERGIC RHINITIS (ICD-477.9) - on ALLEGRA 180mg /d & NASONEX 2spQhs. ASTHMA (ICD-493.90) -  on PROAIR as needed.  HYPERTENSION (ICD-401.9) - controlled on Norvasc 5mg /d... tol well and denies HA, visual changes, CP, palipit, dizziness, syncope, dyspnea, edema, etc... BP=120/70 today despite her neck discomfort. Hx of ATRIAL SEPTAL DEFECT (ICD-745.5) - S/P ASD repair 1985... she has seen DrHochrein & DrKlein  in the past... last 2DEcho 7/06 showed tr MR & TR, normal LVF...  HYPOTHYROIDISM (ICD-244.9) - on Synthroid 143mcg/d... last TSH here was 1.32 in 2007, she gets labs regularly by [employer]...  GERD (ICD-530.81) IRRITABLE BOWEL SYNDROME (ICD-564.1)          << GI - followed by DrWalker in [place]  >> CONSTIPATION (ICD-564.00)  RHEUMATOID ARTHRITIS (ICD-714.0) FIBROMYALGIA (ICD-729.1)                               <<  Rheum -  followed by DrDeveshwar  >> LOW BACK PAIN (ICD-724.2)  ANXIETY (ICD-300.00)  ANEMIA (ICD-285.9) - Hg =11.5 range last yr. SEBACEOUS CYST, SCALP (ICD-706.2) - this resolved w/ Keflex... she has a hx of MRSA exposure from her child and had a MRSA buttock abscess drained by Trecia Rogers 9/08...     Current Allergies: No known allergies   Past Medical  History:        ALLERGIC RHINITIS (ICD-477.9)    ASTHMA (ICD-493.90)    HYPERTENSION (ICD-401.9)    Hx of ATRIAL SEPTAL DEFECT (ICD-745.5)    HYPOTHYROIDISM (ICD-244.9)    GERD (ICD-530.81)    IRRITABLE BOWEL SYNDROME (ICD-564.1)    CONSTIPATION (ICD-564.00)    RHEUMATOID ARTHRITIS (ICD-714.0)    FIBROMYALGIA (ICD-729.1)    LOW BACK PAIN (ICD-724.2)    ANXIETY (ICD-300.00)    ANEMIA (ICD-285.9)    SEBACEOUS CYST, SCALP (ICD-706.2)          Vital Signs:  Patient Profile:   45 Years Old Female Weight:      131.4 pounds O2 Sat:      100 % O2 treatment:    Room Air Temp:     99.4 degrees F oral Pulse rate:   47 / minute BP sitting:   120 / 70  (right arm)  Vitals Entered By: Marijo File CMA (August 03, 2007 3:44 PM)                 Physical Exam  WD, Thin, [name] in NAD... GENERAL:  Alert & oriented; pleasant & cooperative. HEENT:  [place]/AT, EOM-wnl, PERRLA, EACs-clear, TMs-wnl, NOSE-clear, THROAT-clear & wnl. NECK:  Tender post neck area to right, +triggers in trapezius, decr ROM neck, neuromusc intact w/ norm DTR's & strength;  no JVD; normal carotid impulses w/o bruits;  no thyromegaly or nodules palpated; no lymphadenopathy. CHEST:  Clear to P & A; without wheezes/ rales/ or rhonchi. HEART:  Regular Rhythm;  gr 1-2 SEM without rubs or gallops... ABDOMEN:  Soft & nontender; normal bowel sounds; no organomegaly or masses detected. EXT: without deformities, mild arthritic changes; no varicose veins/ venous insuffic/ or edema. NEURO:  CN's intact; motor testing normal; sensory testing normal; gait normal & balance OK. DERM:  No lesions noted; no rash etc...       Impression & Recommendations:  Problem # 1:  NECK PAIN, ACUTE (ICD-723.1) Assessment: New Cervical pain- likely inflammatory & muscle spasm... no radiculopathy...  REC ~ try rest/ heat/ Flexeril/ Medrol pak/ + her other meds including Tramadol, Vicodin, etc...  Appt w/ rheum for f/u and further  eval as indicated. Her updated medication list for this problem includes:    Tramadol Hcl 50 Mg Tabs (Tramadol hcl) .Marland Kitchen... Take 1 every 4 hours as needed    Hydrocodone-acetaminophen 5-500 Mg Tabs (Hydrocodone-acetaminophen) .Marland Kitchen... Take one tab every 6-8 h as needed for pain...    Cyclobenzaprine Hcl 10 Mg Tabs (Cyclobenzaprine hcl) .Marland Kitchen... Take 1-2 tabs by mouth at bedtime as needed for sleep...  Orders: Rheumatology Referral (Rheumatology) Depo- Medrol 80mg  (J1040) Admin of Therapeutic Inj  intramuscular or subcutaneous (46962)   Problem # 2:  HYPERTENSION (ICD-401.9) Assessment: Unchanged Stable... continue med. Her updated medication list for this problem includes:    Norvasc 5 Mg Tabs (Amlodipine besylate) .Marland Kitchen... Take 1 by mouth once daily   Complete Medication List: 1)  Proair Hfa 108 (90 Base) Mcg/act Aers (Albuterol sulfate) .Marland Kitchen.. 1-2 inhalations qid as needed for wheezing... 2)  Nasonex 50 Mcg/act Susp (Mometasone furoate) .... 2 sprays in each nostril at bedtime 3)  Fexofenadine Hcl 180 Mg Tabs (Fexofenadine hcl) .... Take 1 by mouth once daily for allergies.Marland KitchenMarland Kitchen 4)  Norvasc 5 Mg Tabs (Amlodipine besylate) .... Take 1 by mouth once daily 5)  Nexium 40 Mg Cpdr (Esomeprazole magnesium) .... Take 1 tablet by mouth once a day 6)  Metoclopramide Hcl 10 Mg Tabs (Metoclopramide hcl) .... Take as directed... 7)  Hyoscyamine Sulfate 0.125 Mg Tabs (Hyoscyamine sulfate) .... Take 1 every 4 hours as needed 8)  Miralax Powd (Polyethylene glycol 3350) .Marland Kitchen.. 1 capful in water once or twice daily as directed... 9)  Synthroid 137 Mcg Tabs (Levothyroxine sodium) .... Take 1 tablet by mouth once a day 10)  Plaquenil 200 Mg Tabs (Hydroxychloroquine sulfate) .... Take one tab daily as directed by drdeveshwar... 11)  Lyrica 50 Mg Caps (Pregabalin) .... Take one tab by mouth three times a day as directed... 12)  Prednisone 5 Mg Tabs (Prednisone) .... Take as directed by drdeveshwar... 13)  Centrum Tabs  (Multiple vitamins-minerals) .... Take 1 tablet by mouth once a day 14)  Folic Acid 1 Mg Tabs (Folic acid) .... Take one tab by mouth two times a day as directed... 15)  Tramadol Hcl 50 Mg Tabs (Tramadol hcl) .... Take 1 every 4 hours as needed 16)  Hydrocodone-acetaminophen 5-500 Mg Tabs (Hydrocodone-acetaminophen) .... Take one tab every 6-8 h as needed for pain... 17)  Imipramine Hcl 25 Mg Tabs (Imipramine hcl) .... Take 1-2 tabs by mouth at bedtime as needed for sleep... 18)  Cyclobenzaprine Hcl 10 Mg Tabs (Cyclobenzaprine hcl) .... Take 1-2 tabs by mouth at bedtime as needed for sleep...   Patient Instructions: 1)  Start using a heating pad on your neck for relief of muscle spasm... 2)  Use  your FLEXERIL (Cyclobenzaprine) 1/2 to 1 tab 3 time daily for muscle spasm... 3)  Take the Medrol Dosepak as directed (til gone) for inflammation.Marland KitchenMarland Kitchen 4)  Continue your Tramadol and Hydrocodone for pain.Marland KitchenMarland Kitchen 5)  Follow up w/ DrDeveshwar ASAP for further evaluation of your neck pain...    ]  Medication Administration  Injection # 1:    Medication: Depo- Medrol 80mg     Diagnosis: NECK PAIN, ACUTE (ICD-723.1)    Route: IM    Site: LUOQ gluteus    Exp Date: 09/2008    Lot #: [id number]    Mfr: sicor    Patient tolerated injection without complications    Given by: Marijo File CMA (August 03, 2007 5:14 PM)  Orders Added: 1)  Est. Patient Level III [99213] 2)  Rheumatology Referral [Rheumatology] 3)  Depo- Medrol 80mg  [J1040] 4)  Admin of Therapeutic Inj  intramuscular or subcutaneous [16606]

## 2010-06-17 NOTE — Progress Notes (Signed)
Summary: appt?  Phone Note Call from Patient Call back at [phone]   Caller: Patient Call For: nadel Reason for Call: Talk to Nurse Summary of Call: Midrin not helping migraines any longer.  Does pt need to see SN or does he want to refer her to Neurolgist? Initial call taken by: Eugene Gavia,  January 15, 2009 4:39 PM  Follow-up for Phone Call        per SN---ok to refer her to the headache management clinic please.  thanks Marijo File CMA  January 16, 2009 8:44 AM   Spoke with pt and made aware order has been sent to [facility] and they will contact her to set up appt. at headache clinic. Follow-up by: Vernie Murders,  January 16, 2009 9:11 AM

## 2010-06-17 NOTE — Letter (Signed)
Summary: Low back & hand pain/[facility]  Low back & hand pain/[facility]   Imported By: Sherian Rein 03/22/2009 10:43:26  _____________________________________________________________________  External Attachment:    Type:   Image     Comment:   External Document

## 2010-06-17 NOTE — Letter (Signed)
Summary: [facility]  [facility]   Imported By: Lester Rankin 11/30/2008 09:18:37  _____________________________________________________________________  External Attachment:    Type:   Image     Comment:   External Document

## 2010-06-17 NOTE — Letter (Signed)
Summary: [employer]  [address]   [place], [place] [address]   Phone: [phone]  Fax: [phone]    Today's Date: July 25, 2009  Name of Patient: Brittany Hodges  The above named patient had a medical visit today at:  11:45 am.  Please take this into consideration when reviewing the time away from co-op.    Special Instructions:  [ X ] None  [ X ] To be off the remainder of today, returning to the normal co-op schedule tomorrow.  [  ] To be off until the next scheduled appointment on ______________________.  [  ] Other ________________________________________________________________ ________________________________________________________________________   Sincerely yours,       Braeson Rupe, N.P.

## 2010-06-17 NOTE — Assessment & Plan Note (Signed)
Summary: Acute ov for thoat pain    Primary Provider/Referring Provider:  nadel  CC:  pt states she feels like she has a hole in her throat that causes SOB with talking.  Marland Kitchen  History of Present Illness: 45 year old BF hx of  RA and Fibromyalgia and is followed by DrDeveshwar for rheumatology on Prednisone, Plaquenil, Lyrica, Imipramine, Flexeril, Tramadol, and Vicodin...     ~  3/09:  c/o neck pain... she has a hx of RA and Fibromyalgia and is followed by [facility] for rheumatology on Prednisone, Plaquenil, Lyrica, Imipramine, Flexeril, Tramadol, and Vicodin... presents w/ 4d hx of pain in the neck, esp on the right posterior neck area w/ decr ROM, tenderness to palpation, and trigger points in her trapezius region... she denies crepitus w/ rotation of head, pain down the arms, weakness in the arms, etc... CSpine films from 8/03 are avail in [place] and showed mild scoliosis and kyphosis, & mild disc sp narrowing C4-5... Rx w/ rest/ heat/ Medrol/ Flexeril/ etc.   ~  November 08, 2008:  she has been followed over the last year by [employer], DrKerr, and DrKlein... returns today w/ c/o headaches that occur monthly just after onset on menses- frontal/ temples/ and posterior band-like pain> likely mixed muscle contraction/ migraine headaches... she has Midrin & this helps the pain when she takes it... ** we discussed starting the Midrin 1-2/d w/ the onset of menses to prevent the onset of these headaches- she will let me know how this works.  April 04, 2009--Presents for work in visit. Complains of something in throat. She feels like she has a hole in her throat that causes SOB with talking. Seen by GI and had  had EGD 6 weeks ago by  [facility], found mild reflux per pt. Rec to continue on nexium. Sensation is not better. Prolonged talking feels like something is in throat, "feels lke she has hole in throat"  No cough, drainage, dyspnea, vomitting. Started Enbrel 3 weeks ago. Had blood work this am at  [facility]. Denies chest pain, dyspnea, orthopnea, hemoptysis, fever, n/v/d, edema, headache,weight loss, dysphagia, solid food dysphagia,      Medications Prior to Update: 1)  Proair Hfa 108 (90 Base) Mcg/act  Aers (Albuterol Sulfate) .Marland Kitchen.. 1-2 Inhalations Qid As Needed For Wheezing... 2)  Nasonex 50 Mcg/act  Susp (Mometasone Furoate) .... 2 Sprays in Each Nostril At Bedtime 3)  Fexofenadine Hcl 180 Mg  Tabs (Fexofenadine Hcl) .... Take 1 By Mouth Once Daily For Allergies.Marland KitchenMarland Kitchen 4)  Norvasc 5 Mg  Tabs (Amlodipine Besylate) .... Take 1/2 Tab By Mouth Once Daily.Marland KitchenMarland Kitchen 5)  Nexium 40 Mg  Cpdr (Esomeprazole Magnesium) .... Take 1 Tablet By Mouth Once A Day 6)  Miralax   Powd (Polyethylene Glycol 3350) .Marland Kitchen.. 1 Capful in Water Once or Twice Daily As Directed... 7)  Prednisone 5 Mg  Tabs (Prednisone) .... Take As Directed By [name]... 8)  Centrum   Tabs (Multiple Vitamins-Minerals) .... Take 1 Tablet By Mouth Once A Day 9)  Folic Acid 1 Mg  Tabs (Folic Acid) .... Take One Tab By Mouth Two Times A Day As Directed... 10)  Tramadol Hcl 50 Mg  Tabs (Tramadol Hcl) .... Take 1 Every 4 Hours As Needed 11)  Hydrocodone-Acetaminophen 5-500 Mg  Tabs (Hydrocodone-Acetaminophen) .... Take One Tab Every 6-8 H As Needed For Pain... 12)  Midrin 325-65-100 Mg Caps (Apap-Isometheptene-Dichloral) .... Take One Tablet By Mouth Every 4 Hours As Needed For Headache 13)  Mucinex D  60-600 Mg Xr12h-Tab (Pseudoephedrine-Guaifenesin) .... Take 1 Tablets By Mouth Two Times A Day With Lots of Fluids 14)  Levothyroxine Sodium 88 Mcg Tabs (Levothyroxine Sodium) .Marland Kitchen.. 1 Once Daily 15)  Promethazine Hcl 25 Mg Tabs (Promethazine Hcl) .... As Needed 16)  Vitamin D 1000 Unit Tabs (Cholecalciferol) .... Take One Tablet By Mouth Once Daily 17)  Cefdinir 300 Mg Caps (Cefdinir) .... 2 Daily 18)  Methotrexate 2.5 Mg Tabs (Methotrexate Sodium) .... 6 Tabs Per Week 19)  Simply Saline 0.9 % Aers (Saline) .... Prn 20)  Afrin .... Prn  Current  Medications (verified): 1)  Proair Hfa 108 (90 Base) Mcg/act  Aers (Albuterol Sulfate) .Marland Kitchen.. 1-2 Inhalations Qid As Needed For Wheezing... 2)  Nasonex 50 Mcg/act  Susp (Mometasone Furoate) .... 2 Sprays in Each Nostril At Bedtime 3)  Fexofenadine Hcl 180 Mg  Tabs (Fexofenadine Hcl) .... Take 1 By Mouth Once Daily For Allergies As Needed 4)  Norvasc 5 Mg  Tabs (Amlodipine Besylate) .... Take 1/2 Tab By Mouth Once Daily.Marland KitchenMarland Kitchen 5)  Nexium 40 Mg  Cpdr (Esomeprazole Magnesium) .... Take 1 Tablet By Mouth Once A Day 6)  Miralax   Powd (Polyethylene Glycol 3350) .Marland Kitchen.. 1 Capful in Water Once or Twice Daily As Directed... 7)  Prednisone 5 Mg  Tabs (Prednisone) .... Take As Directed By [name]... 8)  Centrum   Tabs (Multiple Vitamins-Minerals) .... Take 1 Tablet By Mouth Once A Day 9)  Folic Acid 1 Mg  Tabs (Folic Acid) .... Take One Tab By Mouth Two Times A Day As Directed... 10)  Tramadol Hcl 50 Mg  Tabs (Tramadol Hcl) .... Take 1 Every 4 Hours As Needed 11)  Hydrocodone-Acetaminophen 5-500 Mg  Tabs (Hydrocodone-Acetaminophen) .... Take One Tab Every 6-8 H As Needed For Pain... 12)  Mucinex D 60-600 Mg Xr12h-Tab (Pseudoephedrine-Guaifenesin) .... Take 1 Tablets By Mouth Two Times A Day With Lots of Fluids 13)  Levothyroxine Sodium 88 Mcg Tabs (Levothyroxine Sodium) .Marland Kitchen.. 1 Once Daily 14)  Promethazine Hcl 25 Mg Tabs (Promethazine Hcl) .... As Needed 15)  Vitamin D 1000 Unit Tabs (Cholecalciferol) .... Take One Tablet By Mouth Once Daily 16)  Enbrel 50 Mg/ml Soln (Etanercept) .... Once A Week 17)  Simply Saline 0.9 % Aers (Saline) .Marland Kitchen.. 1-2 Sprays Each Nostril As Needed 18)  Afrin Nasal Spray 0.05 % Soln (Oxymetazoline Hcl) .Marland Kitchen.. 1-2 Sprays Each Nostril As Needed  Allergies (verified): No Known Drug Allergies  Past History:  Past Surgical History: Last updated: 11/08/2008 tubal ligation ASD repair  Family History: Last updated: 08/03/2008 Positive for RA, hypertension, and diabetes  Social  History: Last updated: 08/03/2008 Tobacco Use - No.  Alcohol Use - no Drug Use - no  Risk Factors: Smoking Status: never (08/03/2008)  Past Medical History: ALLERGIC RHINITIS (ICD-477.9) - on ALLEGRA 180mg /d & NASONEX 2spQhs...  ASTHMA (ICD-493.90) -  uses PROAIR as needed...    HYPERTENSION (ICD-401.9) - controlled on Norvasc 5mg /d.Marland Kitchen   Hx of ATRIAL SEPTAL DEFECT (ICD-745.5) - S/P ASD repair 1985... she has seen DrHochrein & DrKlein  in the past... last 2DEcho 7/06 showed tr MR & TR, normal LVF...  ~  Cards f/u 3/10 w/ DrKlein for hx SA node dysfunction- doing OK/ stable rhythm/ good exerc capacity.  HYPOTHYROIDISM (ICD-244.9) - on Synthroid 71mcg/d now per DrKerr... last TSH here was 1.32 in 2007.  ~  9/09: note from DrKerr reviewed- she had been off Synthroid & TSH was 13.14 w/ Synthroid restarted...  GERD (ICD-530.81) IRRITABLE BOWEL SYNDROME (  ICD-564.1)   << GI - followed by DrWalker in [place]  >> CONSTIPATION (ICD-564.00)  RHEUMATOID ARTHRITIS (ICD-714.0)   <<  Rheum -  followed by DrDeveshwar  >> FIBROMYALGIA (ICD-729.1)                       off Pred now, on MTX 6/wk + Folic. LOW BACK PAIN (ICD-724.2)  ANXIETY (ICD-300.00) - not currently taking medication.  ANEMIA (ICD-285.9)  ~  labs 7/09 from [place] showed Hg= 12.4  SEBACEOUS CYST, SCALP (ICD-706.2) - this resolved w/ Keflex... she has a hx of MRSA exposure from her child and had a MRSA buttock abscess drained by Trecia Rogers 9/08...  Review of Systems      See HPI  Vital Signs:  Patient profile:   45 year old female Height:      68 inches Weight:      140 pounds BMI:     21.36 O2 Sat:      100 % on Room air Temp:     98.2 degrees F oral Pulse rate:   49 / minute BP sitting:   114 / 72  (left arm) Cuff size:   regular  Vitals Entered By: Boone Master CNA (April 04, 2009 4:22 PM)  O2 Flow:  Room air CC: pt states she feels like she has a hole in her throat that causes SOB with talking.   Is Patient  Diabetic? No Comments Medications reviewed with patient Daytime contact number verified with patient. Boone Master CNA  April 04, 2009 4:22 PM    Physical Exam  Additional Exam:  WD, Thin, [name] in NAD... GENERAL:  Alert & oriented; pleasant & cooperative. HEENT:  [place]/AT, EOM-wnl, PERRLA, EACs-clear, TMs-wnl, NOSE-clear   THROAT-clear & wnl. NECK: no JVD; normal carotid impulses w/o bruits; no thyromegaly or nodules palpated; no lymphadenopathy. CHEST:  Clear to P & A; without wheezes/ rales/ or rhonchi. HEART:  Regular Rhythm;  gr 1-2 SEM without rubs or gallops... ABDOMEN:  Soft & nontender; normal bowel sounds; no organomegaly or masses detected. EXT: without deformities, mild arthritic changes; no varicose veins/ venous insuffic/ or edema. DERM:  No lesions noted; no rash etc...     Impression & Recommendations:  Problem # 1:  THROAT PAIN (ICD-784.1) ?etiolgy possible secondary to reflux or rhinitis.  REC:  Take allegra daily for at least 7 days Saline nasal rinses as needed  Nasonex 2 puffs two times a day  Increase Nexium 40mg  two times a day for 2 weeks  If does not resolve may need referral to ENT to evaluate.  Please contact office for sooner follow up if symptoms do not improve or worsen  follow up Dr. Kriste Basque 3 months for physical  flu shot today  Orders: Est. Patient Level III ([address])  Medications Added to Medication List This Visit: 1)  Fexofenadine Hcl 180 Mg Tabs (Fexofenadine hcl) .... Take 1 by mouth once daily for allergies as needed 2)  Enbrel 50 Mg/ml Soln (Etanercept) .... Once a week 3)  Simply Saline 0.9 % Aers (Saline) .Marland Kitchen.. 1-2 sprays each nostril as needed 4)  Afrin Nasal Spray 0.05 % Soln (Oxymetazoline hcl) .Marland Kitchen.. 1-2 sprays each nostril as needed  Complete Medication List: 1)  Proair Hfa 108 (90 Base) Mcg/act Aers (Albuterol sulfate) .Marland Kitchen.. 1-2 inhalations qid as needed for wheezing... 2)  Nasonex 50 Mcg/act Susp (Mometasone furoate) .... 2  sprays in each nostril at bedtime 3)  Fexofenadine Hcl 180 Mg  Tabs (Fexofenadine hcl) .... Take 1 by mouth once daily for allergies as needed 4)  Norvasc 5 Mg Tabs (Amlodipine besylate) .... Take 1/2 tab by mouth once daily.Marland KitchenMarland Kitchen 5)  Nexium 40 Mg Cpdr (Esomeprazole magnesium) .... Take 1 tablet by mouth once a day 6)  Miralax Powd (Polyethylene glycol 3350) .Marland Kitchen.. 1 capful in water once or twice daily as directed... 7)  Prednisone 5 Mg Tabs (Prednisone) .... Take as directed by drdeveshwar... 8)  Centrum Tabs (Multiple vitamins-minerals) .... Take 1 tablet by mouth once a day 9)  Folic Acid 1 Mg Tabs (Folic acid) .... Take one tab by mouth two times a day as directed... 10)  Tramadol Hcl 50 Mg Tabs (Tramadol hcl) .... Take 1 every 4 hours as needed 11)  Hydrocodone-acetaminophen 5-500 Mg Tabs (Hydrocodone-acetaminophen) .... Take one tab every 6-8 h as needed for pain... 12)  Mucinex D 60-600 Mg Xr12h-tab (Pseudoephedrine-guaifenesin) .... Take 1 tablets by mouth two times a day with lots of fluids 13)  Levothyroxine Sodium 88 Mcg Tabs (Levothyroxine sodium) .Marland Kitchen.. 1 once daily 14)  Promethazine Hcl 25 Mg Tabs (Promethazine hcl) .... As needed 15)  Vitamin D 1000 Unit Tabs (Cholecalciferol) .... Take one tablet by mouth once daily 16)  Enbrel 50 Mg/ml Soln (Etanercept) .... Once a week 17)  Simply Saline 0.9 % Aers (Saline) .Marland Kitchen.. 1-2 sprays each nostril as needed 18)  Afrin Nasal Spray 0.05 % Soln (Oxymetazoline hcl) .Marland Kitchen.. 1-2 sprays each nostril as needed  Patient Instructions: 1)  Take allegra daily for at least 7 days 2)  Saline nasal rinses as needed  3)  Nasonex 2 puffs two times a day  4)  Increase Nexium 40mg  two times a day for 2 weeks  5)  If does not resolve may need referral to ENT to evaluate.  6)  Please contact office for sooner follow up if symptoms do not improve or worsen  7)  follow up Dr. Kriste Basque 3 months for physical  8)  flu shot today Prescriptions: FEXOFENADINE HCL 180 MG   TABS (FEXOFENADINE HCL) take 1 by mouth once daily for allergies as needed  #30 x 5   Entered and Authorized by:   Rubye Oaks NP   Signed by:   Rubye Oaks NP on 04/04/2009   Method used:   Electronically to        [id number] (retail)       [address]       [place], [place]  [address]       Ph: [phone] or [phone]       Fax: [phone]   RxID:   [id number]   Appended Document: flu vaccine documentation   Flu Vaccine Consent Questions     Do you have a history of severe allergic reactions to this vaccine? no    Any prior history of allergic reactions to egg and/or gelatin? no    Do you have a sensitivity to the preservative Thimersol? no    Do you have a past history of Guillan-Barre Syndrome? no    Do you currently have an acute febrile illness? no    Have you ever had a severe reaction to latex? no    Vaccine information given and explained to patient? yes    Are you currently pregnant? no    Lot Number:[id number]   Exp Date:11/14/2009   Site Given  Left Deltoid IM  Boone Master CNA  April 04, 2009 4:53 PM   Clinical Lists Changes  Orders: Added new Service order of Admin 1st Vaccine (45409) - Signed Added new Service order of Flu Vaccine 52yrs + [phone]) - Signed Observations: Added new observation of FLU VAX VIS: 12/25/08 version (04/05/2009 11:57) Added new observation of FLU VAXLOT: [id number] (04/05/2009 11:57) Added new observation of FLU VAXMFR: Glaxosmithkline (04/05/2009 11:57) Added new observation of FLU VAX EXP: 11/14/2009 (04/05/2009 11:57) Added new observation of FLU VAX DSE: 0.22ml (04/05/2009 11:57) Added new observation of FLU VAX: Fluvax 3+ (04/05/2009 11:57)

## 2010-06-17 NOTE — Progress Notes (Signed)
Summary: ref needed for pt to be seen  Phone Note Call from Patient Call back at Home Phone [phone]   Caller: Patient Call For: Bastian Andreoli Summary of Call: wants a ref sent to [facility] for migranes send it to this #    [phone] Initial call taken by: Lacinda Axon,  November 02, 2008 8:41 AM  Follow-up for Phone Call        please advise.  Aundra Millet Reynolds LPN  November 02, 2008 8:56 AM    per SN---call pt ---her last ov with SN was 3/09---they will not accept referral without a recent note from SN with her problems and details of her headache complaint---she needs rov with SN and also her last labs here were in 2008---she needs rov with SN next week--next thursday if no other day is avaliable  at 2pm Marijo File CMA  November 02, 2008 2:03 PM   Additional Follow-up for Phone Call Additional follow up Details #1::        called and spoke with pt and she is aware of appt scheduled to see Dr. Kriste Basque on 6-24 at 2pm to get the referral to [facility]. Marijo File CMA  November 02, 2008 3:12 PM

## 2010-06-17 NOTE — Assessment & Plan Note (Signed)
Summary: HTN, bradycardia//JJ   Primary Care Provider:  Francheska Villeda  CC:  2 month ROV & add-on visit for BP & HR....  History of Present Illness: [name] here for a follow up visit...     ~  November 08, 2008:  she has been followed over the last year by [employer], DrKerr, and DrKlein... returns today w/ c/o headaches that occur monthly just after onset on menses- frontal/ temples/ and posterior band-like pain> likely mixed muscle contraction/ migraine headaches... she has Midrin & this helps the pain when she takes it...  subseq referred to [facility]agen [facility] & treated w/ Topamax, Tizanidine, Promethazine...   ~  January 22, 2010:  14 month ROV- she's been OOW x several yrs and unemployment ran out,  just got new job & here to re-establish> she is off all prev meds (see below) & only taking MVI, Vit D, & Dexilant samples from [facility]... she was seen by ENT in [place] recently w/ throat symptoms related to reflux & Dexilant started;  also found to have TMJ problem, and c/o sinus congestion> we discussed Saline, Nasonex, Breathe-right nasal strips Qhs... non-fasting labs done today & WNL x TSH=7.79 & Synthroid 93mcg/d started...   ~  March 25, 2010:  she is feeling sl better on the Levothy75 & we will recheck her TSH today to adjust as needed;  FLP 1011 by DrKlein looked good;  HR is chronically slow (40's) & she denies recurrent CP, not dizzy etc...    She saw DrKlein 9/11for CP & Bradycardia (junctional brady) in the setting of prev ASD repair- not on BP/vasoactive meds, heart rate in the 40's... she had Myoview 10/11 which was abnormal w/ mild revers defect in ant wall, mild LV dil & EF= 52%... she tells me they are trying to hold off on pacemaker.   She also saw DrNesi due to episode of gross then micro-hematuria> his UA was clear & Sonar is planned & pending at this time...    Current Problems:   ALLERGIC RHINITIS (ICD-477.9) - on OTC Antihist (eg- ZYRTEK) Qam; Saline nasal sp every 1-2  H during the day + MUCINEX 2Bid; & FLONASE 2spQhs... rec to use breathe right nasal strips at bedtime Prn to help w/ drainage... she knows to avoid Afrin due to BP.  ~  9/11:  she reports that recent ENT eval in [place] showed reflux related ENT symptoms & Rx w/ Dexilant> ch to OMEPRAZOLE 40mg Bid.  ASTHMA (ICD-493.90) -  uses PROAIR as needed... denies cough, sputum, hemoptysis, worsening dyspnea, wheezing, chest pains, snoring, daytime hypersomnolence, etc...   HYPERTENSION (ICD-401.9) - controlled on low salt diet alone (prev on Norvasc & avoiding meds due to her bradycardia)... denies visual changes, CP, palipit, dizziness, syncope, dyspnea, edema, etc... BP= 110/76 today & doing satis w/o medication now & she knows to avoid Afrin etc...  Hx of CHEST PAIN (ICD-786.50) Hx of ATRIAL SEPTAL DEFECT (ICD-745.5) - S/P ASD repair 1985... she has seen DrHochrein & DrKlein in the past...   ~  2DEcho 7/06 showed tr MR & TR, normal LVF...  ~  Cards f/u 3/10 w/ DrKlein for hx SA node dysfunction- doing OK/ stable rhythm/ good exerc capacity.  ~   She saw DrKlein 9/11for CP & Bradycardia (junctional brady) in the setting of prev ASD repair- not on BP/vasoactive meds, heart rate in the 40's... she tells me they are trying to hold off on pacemaker as long as poss.  ~  Myoview 10/11 was abnormal w/ mild revers defect in ant wall, mild LV dil & EF= 52%...  HYPOTHYROIDISM (ICD-244.9) - now on SYNTHROID 19mcg/d... prev on Synthroid 1mcg/d per DrKerr, but she was out of meds for over a yr when she returned here 9/11...  ~  TSH here in 2007 = 1.32  ~  9/09: note from DrKerr reviewed- she had been off Synthroid & TSH was 13.14 w/ Synthroid restarted.  ~  labs here 9/11 off meds showed TSH= 7.79... rec> start SYNTHROID 53mcg/d.  ~  labs here 11/11 on Levo75 showed TSH=   GERD (ICD-530.81) - on OMEPRAZOLE 40mg Bid IRRITABLE BOWEL SYNDROME (ICD-564.1)   << GI - followed by DrWalker in [place]  >> CONSTIPATION  (ICD-564.00)  HEMATURIA UNSPECIFIED (ICD-599.70) - she developed gross hematuria 9/11- neg culture; f/u showed microhematuria & referred to Urology;  seen by drNesi 10/11 & his UA was neg- Ultrasound planned & pending at this time.  RHEUMATOID ARTHRITIS (ICD-714.0)   <<  Rheum -  followed by DrDeveshwar  >> FIBROMYALGIA (ICD-729.1)                  prev Rx> Pred, MTX, Humira, Enbrel - off all meds since 2010 LOW BACK PAIN (ICD-724.2)  ~  she has a hx of RA and Fibromyalgia and was followed by DrDeveshwar for Rheumatology- prev on Prednisone, Plaquenil, Lyrica, Imipramine, Flexeril, Tramadol, and Vicodin...  ANXIETY (ICD-300.00) - not currently taking medication.  ANEMIA (ICD-285.9)  ~  labs 7/09 from [place] showed Hg= 12.4  ~  labs 9/11 here showed Hg= 13.0  SEBACEOUS CYST, SCALP (ICD-706.2) - this resolved w/ Keflex... she has a hx of MRSA exposure from her child and had a MRSA buttock abscess drained by Trecia Rogers 9/08...   Preventive Screening-Counseling & Management  Alcohol-Tobacco     Smoking Status: never  Caffeine-Diet-Exercise     Exercise (avg: min/session): 9:46  Allergies (verified): No Known Drug Allergies  Comments:  Nurse/Medical Assistant: The patient's medications and allergies were reviewed with the patient and were updated in the Medication and Allergy Lists.  Past History:  Past Medical History: ALLERGIC RHINITIS (ICD-477.9) ASTHMA (ICD-493.90) HYPERTENSION (ICD-401.9) CHEST PAIN (ICD-786.50) Hx of ATRIAL SEPTAL DEFECT (ICD-745.5) SINOATRIAL NODE DYSFUNCTION (ICD-427.81) HYPOTHYROIDISM (ICD-244.9) GERD (ICD-530.81) IRRITABLE BOWEL SYNDROME (ICD-564.1) CONSTIPATION (ICD-564.00) HEMATURIA UNSPECIFIED (ICD-599.70) RHEUMATOID ARTHRITIS (ICD-714.0) FIBROMYALGIA (ICD-729.1) LOW BACK PAIN (ICD-724.2) HEADACHE, CHRONIC (ICD-784.0) ANXIETY (ICD-300.00) ANEMIA (ICD-285.9) SEBACEOUS CYST, SCALP (ICD-706.2)  Past Surgical History: S/P tubal ligation S/P  ASD repair  Family History: Reviewed history from 01/22/2010 and no changes required. Positive for RA, hypertension, and diabetes  Social History: Reviewed history from 08/03/2008 and no changes required. Tobacco Use - No.  Alcohol Use - no Drug Use - no  Review of Systems      See HPI       The patient complains of dyspnea on exertion, headaches, hematuria, and muscle weakness.  The patient denies anorexia, fever, weight loss, weight gain, vision loss, decreased hearing, hoarseness, chest pain, syncope, peripheral edema, prolonged cough, hemoptysis, abdominal pain, melena, hematochezia, severe indigestion/heartburn, incontinence, suspicious skin lesions, transient blindness, difficulty walking, depression, unusual weight change, abnormal bleeding, enlarged lymph nodes, and angioedema.    Vital Signs:  Patient profile:   45 year old female Height:      68 inches Weight:      141.50 pounds BMI:     21.59 O2 Sat:      100 % on Room air Temp:  98.2 degrees F oral Pulse rate:   45 / minute BP sitting:   110 / 76  (left arm) Cuff size:   regular  Vitals Entered By: Randell Loop CMA (March 25, 2010 3:28 PM)  O2 Sat at Rest %:  100 O2 Flow:  Room air CC: 2 month ROV & add-on visit for BP & HR... Is Patient Diabetic? No Pain Assessment Patient in pain? no      Comments meds updated today with pt   Physical Exam  Additional Exam:  WD, Thin, 44 y/o BF in NAD... GENERAL:  Alert & oriented; pleasant & cooperative... HEENT:  [place]/AT, EOM-wnl, PERRLA, EACs-clear, TMs-wnl, NOSE- congested, clear discharge; THROAT-clear & wnl. NECK: supple w/ mild decrROM; no JVD; normal carotid impulses w/o bruits; no thyromegaly or nodules palpated; no lymphadenopathy. CHEST:  Clear to P & A; without wheezes/ rales/ or rhonchi. HEART:  Reg bradycardia;  gr 1-2 SEM without rubs or gallops... ABDOMEN:  Soft & nontender; normal bowel sounds; no organomegaly or masses detected. EXT: without  deformities, mild arthritic changes; no varicose veins/ venous insuffic/ or edema. NEURO:  CNs intact; no focal neuro deficits... DERM:  No lesions noted; no rash etc...    MISC. Report  Procedure date:  03/25/2010  Findings:      DATA REVIEWED:  ~  EMR note from DrKlein 02/14/10 & Myoview scan 02/25/10...  ~  Urology note fromDdrNesi 03/13/10...  TSH 03/25/10 on Synthroid 2mcg/d:   FastTSH                   4.67 uIU/mL                 0.35-5.50    Impression & Recommendations:  Problem # 1:  ALLERGIC RHINITIS (ICD-477.9) She is quite distressed by her nasal congestion & some of the HA it relates to (however Rx for yrs by DrHagen w/ chr daily HAs- worse around menses)... rec treatment w/ Zyrtek, Saline, Mucinex, Flonase regularly... discussed in detail. The following medications were removed from the medication list:    Nasonex 50 Mcg/act Susp (Mometasone furoate) .Marland Kitchen... 2 sprays in each nostril at bedtime Her updated medication list for this problem includes:    Ocean Nasal Spray 0.65 % Soln (Saline) .Marland Kitchen... 1-2 sprays each nostril as needed    Flonase 50 Mcg/act Susp (Fluticasone propionate) .Marland Kitchen... 2 sprays each nostril two times a day    Promethazine Hcl 25 Mg Tabs (Promethazine hcl) .Marland Kitchen... As needed  Problem # 2:  HYPERTENSION (ICD-401.9) BP was only elev w/ Afrin nasal spray & she knows now not to use medicated sprays, just saline...  Problem # 3:  SINOATRIAL NODE DYSFUNCTION (ICD-427.81) Followed by DrKlein & not on any meds due to bradycardia... she says they are trying to hold off as long as poss on placing a pacemeaker...  Problem # 4:  CHEST PAIN (ICD-786.50) Her Myoview was abnormal & DrKlein is aware...  Problem # 5:  HYPOTHYROIDISM (ICD-244.9) TSH improved on the dose>  continue same & take med regularly. Her updated medication list for this problem includes:    Levothyroxine Sodium 75 Mcg Tabs (Levothyroxine sodium) .Marland Kitchen... Take 1 tab by mouth once  daily...  Orders: TLB-TSH (Thyroid Stimulating Hormone) (84443-TSH)  Problem # 6:  GERD (ICD-530.81) GI is stable on meds... Her updated medication list for this problem includes:    Omeprazole 40 Mg Cpdr (Omeprazole) .Marland Kitchen... 1 tab by mouth two times a day  Problem # 7:  RHEUMATOID ARTHRITIS (ICD-714.0) She will need to re-establish w/ Rheum for re-eval & consider dis modifying rx.  Problem # 8:  HEADACHE, CHRONIC (ICD-784.0) [facility]agen [facility]>  continue her meds.  Complete Medication List: 1)  Ocean Nasal Spray 0.65 % Soln (Saline) .Marland Kitchen.. 1-2 sprays each nostril as needed 2)  Flonase 50 Mcg/act Susp (Fluticasone propionate) .... 2 sprays each nostril two times a day 3)  Proair Hfa 108 (90 Base) Mcg/act Aers (Albuterol sulfate) .Marland Kitchen.. 1-2 inhalations qid as needed for wheezing... 4)  Omeprazole 40 Mg Cpdr (Omeprazole) .Marland Kitchen.. 1 tab by mouth two times a day 5)  Levothyroxine Sodium 75 Mcg Tabs (Levothyroxine sodium) .... Take 1 tab by mouth once daily.Marland KitchenMarland Kitchen 6)  Topiramate 25 Mg Tabs (Topiramate) .... 3 tabs by mouth once daily 7)  Tizanidine Hcl 4 Mg Tabs (Tizanidine hcl) .... As needed 8)  Promethazine Hcl 25 Mg Tabs (Promethazine hcl) .... As needed 9)  Centrum Tabs (Multiple vitamins-minerals) .... Take 1 tablet by mouth once a day 10)  Vitamin D 1000 Unit Tabs (Cholecalciferol) .... Take one tablet by mouth once daily  Patient Instructions: 1)  Today we updated your med list- see below.... 2)  We discussed a safe regimen for your sinus congestion:  tale an antihistamine like ZYRTEK (OTC) every morning;  spray a non-medicated plain saline nasal spray every 1-2 H during the day (do NOT use Afrin etc);  use plain MUCINEX (blue box) 2 tabs twice daily & drink plenty of fluids;  then spray the FLONASE (Fluticasone) in each nostril at bedtime.Marland KitchenMarland Kitchen 3)  Today we repeated your Thyroid blood test... please call the "phone tree" in a few days for your lab results.Marland KitchenMarland Kitchen 4)  Call for any  problems.Marland KitchenMarland Kitchen 5)  Please schedule a follow-up appointment in 4 months.   Immunization History:  Influenza Immunization History:    Influenza:  historical (02/21/2010)

## 2010-06-17 NOTE — Assessment & Plan Note (Signed)
Summary: 1 MONTH RETURN/MH   Chief Complaint:  Follow uo OV....  History of Present Illness: [name] here for an ROV... I last saw her in Nov for a routine follow up, and she saw TParrett,NP 05/26/06 w/ an infected seb cyst on her scalp- Rx'd w/ keflex... resolved.  She is followed by [name] for Rheum w/ RA and FM... she has a f/u appt soon.  << she is off her placquenil for several weeks now and was instructed to restart after her ophthal f/u w/ DrBrewington  >> She is followed by DrWalker in [place] for GI w/ GERD, IBS w/constip... she has a f/u appt soon.   Current Problems:  ALLERGIC RHINITIS (ICD-477.9) - she requests refill of Allegra and Nasonex. ASTHMA (ICD-493.90) - she will need change to Proair [facility] for Prn use. HYPERTENSION (ICD-401.9) - controlled on Norvasc 5mg /d... tol well and denies HA, fatigue, visual changes, CP, palipit, dizziness, syncope, dyspnea, edema, etc... Hx of ATRIAL SEPTAL DEFECT (ICD-745.5) - S/P ASD repair 1985... she has seen DrHochrein & DrKlein  in the past... last 2DEcho 7/06 showed tr MR & TR, normal LVF... HYPOTHYROIDISM (ICD-244.9) - on Synthroid 121mcg/d... GERD (ICD-530.81) IRRITABLE BOWEL SYNDROME (ICD-564.1)          << followed by DrWalker in [place]  >> CONSTIPATION (ICD-564.00) RHEUMATOID ARTHRITIS (ICD-714.0) FIBROMYALGIA (ICD-729.1)                                  <<  followed by DrDeveshwar  >> LOW BACK PAIN (ICD-724.2) ANXIETY (ICD-300.00) ANEMIA (ICD-285.9) - Hg=11.5 range last yr. SEBACEOUS CYST, SCALP (ICD-706.2) - this resolved w/ Keflex... she has a hx of MRSA exposure from her child and had a MRSA buttock abscess drained by Trecia Rogers 9/08...       Current Allergies: No known allergies   Past Medical History:        ALLERGIC RHINITIS (ICD-477.9)    ASTHMA (ICD-493.90)    HYPERTENSION (ICD-401.9)    Hx of ATRIAL SEPTAL DEFECT (ICD-745.5)    HYPOTHYROIDISM (ICD-244.9)    GERD (ICD-530.81)    IRRITABLE BOWEL  SYNDROME (ICD-564.1)    CONSTIPATION (ICD-564.00)    RHEUMATOID ARTHRITIS (ICD-714.0)    FIBROMYALGIA (ICD-729.1)    LOW BACK PAIN (ICD-724.2)    ANXIETY (ICD-300.00)    ANEMIA (ICD-285.9)    SEBACEOUS CYST, SCALP (ICD-706.2)         Review of Systems  The patient denies anorexia, fever, weight loss, vision loss, decreased hearing, hoarseness, chest pain, syncope, peripheral edema, prolonged cough, hemoptysis, abdominal pain, melena, hematochezia, severe indigestion/heartburn, hematuria, incontinence, muscle weakness, suspicious skin lesions, transient blindness, difficulty walking, depression, unusual weight change, abnormal bleeding, enlarged lymph nodes, and angioedema.     Vital Signs:  Patient Profile:   45 Years Old Female Weight:      137 pounds O2 Sat:      99 % O2 treatment:    Room Air Temp:     98.3 degrees F oral Pulse rate:   63 / minute BP sitting:   116 / 64  (left arm)  Vitals Entered By: Marijo File CMA (June 29, 2007 12:18 PM)                 Physical Exam  WD, Thin, [name] in NAD... GENERAL:  Alert & oriented; pleasant & cooperative. HEENT:  [place]/AT, EOM-wnl, PERRLA, EACs-clear, TMs-wnl, NOSE-clear, THROAT-clear & wnl. NECK:  Supple w/ full ROM; no JVD; normal carotid impulses w/o bruits; no thyromegaly or nodules palpated; no lymphadenopathy. CHEST:  Clear to P & A; without wheezes/ rales/ or rhonchi. HEART:  Regular Rhythm;  gr 1-2 SEM without rubs or gallops... ABDOMEN:  Soft & nontender; normal bowel sounds; no organomegaly or masses detected. EXT: without deformities, mild arthritic changes; no varicose veins/ venous insuffic/ or edema. NEURO:  CN's intact; motor testing normal; sensory testing normal; gait normal & balance OK. DERM:  No lesions noted; no rash etc...       Impression & Recommendations:  Problem # 1:  ASTHMA (ICD-493.90) Assessment: Unchanged Hx asthma and allergies... Proair, Allegra, Nasonex refilled. Her  updated medication list for this problem includes:    Proair Hfa 108 (90 Base) Mcg/act Aers (Albuterol sulfate) .Marland Kitchen... 1-2 inhalations qid as needed for wheezing...    Prednisone 5 Mg Tabs (Prednisone) .Marland Kitchen... Take as directed by drdeveshwar...   Problem # 2:  HYPERTENSION (ICD-401.9) Assessment: Unchanged Controlled... same Rx. Her updated medication list for this problem includes:    Norvasc 5 Mg Tabs (Amlodipine besylate) .Marland Kitchen... Take 1 by mouth once daily   Problem # 3:  Hx of ATRIAL SEPTAL DEFECT (ICD-745.5) Assessment: Unchanged Stable without new symptoms... she would do well to increase her exercise program.  Problem # 4:  HYPOTHYROIDISM (ICD-244.9) Assessment: Unchanged Stable on Synthroid. Her updated medication list for this problem includes:    Synthroid 137 Mcg Tabs (Levothyroxine sodium) .Marland Kitchen... Take 1 tablet by mouth once a day   Problem # 5:  IRRITABLE BOWEL SYNDROME (ICD-564.1) Assessment: Unchanged GI per DrWalker... she will continue f/u w/ him.  Problem # 6:  RHEUMATOID ARTHRITIS (ICD-714.0) Assessment: Unchanged She has RA and FM... she will continue f/u w/ DrDeveshwar. Her updated medication list for this problem includes:    Prednisone 5 Mg Tabs (Prednisone) .Marland Kitchen... Take as directed by drdeveshwar...   Complete Medication List: 1)  Proair Hfa 108 (90 Base) Mcg/act Aers (Albuterol sulfate) .Marland Kitchen.. 1-2 inhalations qid as needed for wheezing... 2)  Nasonex 50 Mcg/act Susp (Mometasone furoate) .... 2 sprays in each nostril at bedtime 3)  Fexofenadine Hcl 180 Mg Tabs (Fexofenadine hcl) .... Take 1 by mouth once daily for allergies.Marland KitchenMarland Kitchen 4)  Norvasc 5 Mg Tabs (Amlodipine besylate) .... Take 1 by mouth once daily 5)  Nexium 40 Mg Cpdr (Esomeprazole magnesium) .... Take 1 tablet by mouth once a day 6)  Metoclopramide Hcl 10 Mg Tabs (Metoclopramide hcl) .... Take as directed... 7)  Hyoscyamine Sulfate 0.125 Mg Tabs (Hyoscyamine sulfate) .... Take 1 every 4 hours as needed 8)   Miralax Powd (Polyethylene glycol 3350) .Marland Kitchen.. 1 capful in water once or twice daily as directed... 9)  Synthroid 137 Mcg Tabs (Levothyroxine sodium) .... Take 1 tablet by mouth once a day 10)  Plaquenil 200 Mg Tabs (Hydroxychloroquine sulfate) .... Take one tab daily as directed by drdeveshwar... 11)  Lyrica 50 Mg Caps (Pregabalin) .... Take one tab by mouth three times a day as directed... 12)  Prednisone 5 Mg Tabs (Prednisone) .... Take as directed by drdeveshwar... 13)  Centrum Tabs (Multiple vitamins-minerals) .... Take 1 tablet by mouth once a day 14)  Folic Acid 1 Mg Tabs (Folic acid) .... Take one tab by mouth two times a day as directed... 15)  Tramadol Hcl 50 Mg Tabs (Tramadol hcl) .... Take 1 every 4 hours as needed 16)  Hydrocodone-acetaminophen 5-500 Mg Tabs (Hydrocodone-acetaminophen) .... Take one tab every 6-8 h as needed  for pain... 17)  Imipramine Hcl 25 Mg Tabs (Imipramine hcl) .... Take 1-2 tabs by mouth at bedtime as needed for sleep... 18)  Cyclobenzaprine Hcl 10 Mg Tabs (Cyclobenzaprine hcl) .... Take 1-2 tabs by mouth at bedtime as needed for sleep...   Patient Instructions: 1)  Today I refilled your respiratory meds.Marland KitchenMarland Kitchen 2)  Continue your other meds the same and keep your follow up w/ DrWalker and DrDeveshwar... 3)  Call for any problems.Marland KitchenMarland Kitchen 4)  Please schedule a follow-up appointment in 6 months.    Prescriptions: FEXOFENADINE HCL 180 MG  TABS (FEXOFENADINE HCL) take 1 by mouth once daily for allergies...  #30 x prn   Entered and Authorized by:   Michele Mcalpine MD   Signed by:   Michele Mcalpine MD on 06/29/2007   Method used:   Print then Give to Patient   RxID:   [id number] NASONEX 50 MCG/ACT  SUSP (MOMETASONE FUROATE) 2 sprays in each nostril at bedtime  #1 x prn   Entered and Authorized by:   Michele Mcalpine MD   Signed by:   Michele Mcalpine MD on 06/29/2007   Method used:   Print then Give to Patient   RxID:   [id number] PROAIR HFA 108 (90 BASE) MCG/ACT   AERS (ALBUTEROL SULFATE) 1-2 inhalations QID as needed for wheezing...  #1 x prn   Entered and Authorized by:   Michele Mcalpine MD   Signed by:   Michele Mcalpine MD on 06/29/2007   Method used:   Print then Give to Patient   RxID:   [id number]  ]

## 2010-06-17 NOTE — Letter (Signed)
Summary: [facility]  [facility]   Imported By: Lanelle Bal 04/02/2008 10:30:30  _____________________________________________________________________  External Attachment:    Type:   Image     Comment:   External Document

## 2010-06-17 NOTE — Letter (Signed)
Summary: [facility]  [facility]   Imported By: Lanelle Bal 04/26/2008 08:38:21  _____________________________________________________________________  External Attachment:    Type:   Image     Comment:   External Document

## 2010-06-17 NOTE — Progress Notes (Signed)
Summary: change in color of urine  Phone Note Call from Patient Call back at [phone] cell   Caller: Patient Call For: [facility] Summary of Call: Pt c/o urine changing color this AM approx 0200 hrs, medium brown in color. Pt denies odor, frequency, urgency, or changes in back pain.    NKDA Initial call taken by: Zackery Barefoot CMA,  February 13, 2010 9:26 AM  Follow-up for Phone Call        per SN----to eval she will need to come in for ua and c&s.  ---called and spoke with pt and she is on her way to the lab now for these tests. Randell Loop [facility]  February 13, 2010 1:15 PM   New Problems: DARK URINE [phone])   New Problems: DARK URINE (ICD-788.69)

## 2010-06-17 NOTE — Progress Notes (Signed)
Summary: elevated b/p  Phone Note Call from Patient   Caller: Patient Call For: nadel Summary of Call: b/p elevated need to talk to nurse Initial call taken by: Rickard Patience,  March 10, 2010 2:09 PM  Follow-up for Phone Call        Spoke with pt. She c/o "constant HA" x 2 wks- sometimes wakes her up in the night.  She states that she thinks that this is related to increased BP- she states " I can always tell when it's high"- reading this am was 151/97.  She states that she has not been on anything for BP since she sopped working.  Pls advise thanks Follow-up by: Vernie Murders,  March 10, 2010 2:25 PM  Additional Follow-up for Phone Call Additional follow up Details #1::        per SN: rec start metoprolol er 50mg  1 by mouth once daily #30 with 5 refills.  need rov after she has been on this x65month.  LMOM TCB x1. Boone Master CNA/MA  March 10, 2010 5:39 PM   Returning call, can be reached at [phone].Darletta Moll  March 11, 2010 8:16 AM      Additional Follow-up for Phone Call Additional follow up Details #2::    Called and spoke with pt.  She states that she ended up going to ER last night b/c her BP was so high- 201-114.  She states that they advised that her BP was elevated due to her overuse of afrin and advised that she stop taking this.  She states that "everything was okay"- with the exception that her pulse rate was in the low 40's. I advised will check with SN to make sure still wants to start her on med.  Pls advise thanks. (ED report not yet in [place]) Follow-up by: Vernie Murders,  March 11, 2010 8:56 AM  Additional Follow-up for Phone Call Additional follow up Details #3:: Details for Additional Follow-up Action Taken: per SN---now dont take the metoprolol and stop the afrin and she will need to schedule an appt date with SN---11-8 at 3:30 is ok to use. thanks Randell Loop CMA  March 11, 2010 9:09 AM   called spoke with patient, informed her of SN's  recs as stated above.  pt verbalized her understanding and is okay with the appt date and time.  pt did ask what she can do for the sinus pressure/congestion in the meantime as this is what elevates her BP.  asked patient if she has tried sinus rinses and saline nasal spray.  pt saud she has not but has something given to her by the ENT - unable to remember the name of it at this time and will call the office back when she gets out of a meeting with name to find out if SN okay's for her to use it.  will hold msg until pt calls back. Boone Master CNA/MA  March 11, 2010 9:19 AM   pt returned call.  she states that she has an Ocean saline nasal irrigation system at home.  i advised pt that since this uses saline water, it is okay to use but patient wonders if there is anything else that SN can recommend. Boone Master CNA/MA  March 11, 2010 3:19 PM    per SN---yes to use the irrgation system at home and call in flonase #1  2 sprays in each nostril two times a day and if this does not work for her sinuses---we will need to  send her back to the ENT .  thanks Randell Loop CMA  March 11, 2010 3:23 PM   Spoke with pt and notified of the above recs per SN. Pt verbalized understanding and rx was sent to pharm' Vernie Murders  March 11, 2010 3:30 PM   New/Updated Medications: FLONASE 50 MCG/ACT SUSP (FLUTICASONE PROPIONATE) 2 sprays each nostril two times a day Prescriptions: FLONASE 50 MCG/ACT SUSP (FLUTICASONE PROPIONATE) 2 sprays each nostril two times a day  #1 x 11   Entered by:   Vernie Murders   Authorized by:   Michele Mcalpine MD   Signed by:   Vernie Murders on 03/11/2010   Method used:   Electronically to        [facility] [phone]* (retail)       [address]       [place], [place]  [address]       Ph: [phone] or [phone]       Fax: [phone]   RxID:   [id number]

## 2010-06-17 NOTE — Letter (Signed)
Summary: Enbrel reaction/[facility]  Enbrel reaction/[facility]   Imported By: Lester [place] 04/30/2009 10:28:13  _____________________________________________________________________  External Attachment:    Type:   Image     Comment:   External Document

## 2010-06-17 NOTE — Progress Notes (Signed)
Summary: sob/ light-headed/ req nurse call  Phone Note Call from Patient   Caller: Patient Call For: Tameca Jerez Summary of Call: re: sob/ pain in both of her sides/ light-headed. pt wants to speak to nurse. doesn't think she needs emergency room. pt has not taken her bp recently but will before nurse calls.  Initial call taken by: Tivis Ringer,  August 25, 2007 3:53 PM  Follow-up for Phone Call        called and spoke with pt.   bp reading 124/78  but feels dizzy at times---feels like she is having to breath harder at times.  told her SN was not in the office today and she stated that if she gets worse she will go to the er.  told her to call back on monday if she is still having the same problems and we will get her in to see someone. Follow-up by: Marijo File CMA,  August 25, 2007 4:08 PM

## 2010-06-17 NOTE — Progress Notes (Signed)
Summary: nos appt  Phone Note Call from Patient   Caller: [email or web address]  Call For: Brittany Hodges Summary of Call: [facility]sc nos from 6/15 to 9/7 @ 2:30p. Initial call taken by: Darletta Moll,  October 31, 2009 9:43 AM

## 2010-06-17 NOTE — Progress Notes (Signed)
Summary: infection  Phone Note Call from Patient Call back at [phone]   Caller: Patient Call For: nadel Reason for Call: Talk to Nurse, Talk to Doctor Summary of Call: ? sinus infection, was sick over w/e and went to ed - they said she had pleurisy. Initial call taken by: Eugene Gavia,  December 05, 2007 2:24 PM  Follow-up for Phone Call        Appt Scheduled Follow-up by: Vernie Murders,  December 05, 2007 2:48 PM

## 2010-06-17 NOTE — Consult Note (Signed)
[facility]   Imported By: Lester [place] 02/13/2009 10:28:33  _____________________________________________________________________  External Attachment:    Type:   Image     Comment:   External Document

## 2010-06-17 NOTE — Assessment & Plan Note (Signed)
Summary: Acute NP office visit - sinus inf   Primary Provider/Referring Provider:  nadel  CC:  sinus inf: sinus pressure/congestion with dark yellow drainage, nausea associated with this PND, HA and pressure in left ear.  states breathing is okay., and Headache.  History of Present Illness: 45 year old BF hx of  RA and Fibromyalgia and is followed by DrDeveshwar for rheumatology on Prednisone, Plaquenil, Lyrica, Imipramine, Flexeril, Tramadol, and Vicodin...      ~  November 08, 2008:  she has been followed over the last year by [employer], DrKerr, and DrKlein... returns today w/ c/o headaches that occur monthly just after onset on menses- frontal/ temples/ and posterior band-like pain> likely mixed muscle contraction/ migraine headaches... she has Midrin & this helps the pain when she takes it... ** we discussed starting the Midrin 1-2/d w/ the onset of menses to prevent the onset of these headaches- she will let me know how this works.  April 04, 2009--Presents for work in visit. Complains of something in throat. She feels like she has a hole in her throat that causes SOB with talking. Seen by GI and had  had EGD 6 weeks ago by  [facility], found mild reflux per pt. Rec to continue on nexium. Sensation is not better. Prolonged talking feels like something is in throat, "feels lke she has hole in throat"  No cough, drainage, dyspnea, vomitting. Started Enbrel 3 weeks ago. Had blood work this am at [facility].   July 25, 2009 --Presents for an acute work in visit. Complains of sinus inf: sinus pressure/congestion with dark yellow drainage, nausea associated with this PND, HA and pressure in left ear.  states breathing is okay.Marland Kitchen Head hurts when she bends forward. OTC not helping. Symptoms going on for last week, worse for 2 days. Denies chest pain, dyspnea, orthopnea, hemoptysis, fever, n/v/d, edema, headache.   Medications Prior to Update: 1)  Proair Hfa 108 (90 Base) Mcg/act  Aers (Albuterol  Sulfate) .Marland Kitchen.. 1-2 Inhalations Qid As Needed For Wheezing... 2)  Nasonex 50 Mcg/act  Susp (Mometasone Furoate) .... 2 Sprays in Each Nostril At Bedtime 3)  Fexofenadine Hcl 180 Mg  Tabs (Fexofenadine Hcl) .... Take 1 By Mouth Once Daily For Allergies As Needed 4)  Norvasc 5 Mg  Tabs (Amlodipine Besylate) .... Take 1/2 Tab By Mouth Once Daily.Marland KitchenMarland Kitchen 5)  Nexium 40 Mg  Cpdr (Esomeprazole Magnesium) .... Take 1 Tablet By Mouth Once A Day 6)  Miralax   Powd (Polyethylene Glycol 3350) .Marland Kitchen.. 1 Capful in Water Once or Twice Daily As Directed... 7)  Prednisone 5 Mg  Tabs (Prednisone) .... Take As Directed By [name]... 8)  Centrum   Tabs (Multiple Vitamins-Minerals) .... Take 1 Tablet By Mouth Once A Day 9)  Folic Acid 1 Mg  Tabs (Folic Acid) .... Take One Tab By Mouth Two Times A Day As Directed... 10)  Tramadol Hcl 50 Mg  Tabs (Tramadol Hcl) .... Take 1 Every 4 Hours As Needed 11)  Hydrocodone-Acetaminophen 5-500 Mg  Tabs (Hydrocodone-Acetaminophen) .... Take One Tab Every 6-8 H As Needed For Pain... 12)  Mucinex D 60-600 Mg Xr12h-Tab (Pseudoephedrine-Guaifenesin) .... Take 1 Tablets By Mouth Two Times A Day With Lots of Fluids 13)  Levothyroxine Sodium 88 Mcg Tabs (Levothyroxine Sodium) .Marland Kitchen.. 1 Once Daily 14)  Promethazine Hcl 25 Mg Tabs (Promethazine Hcl) .... As Needed 15)  Vitamin D 1000 Unit Tabs (Cholecalciferol) .... Take One Tablet By Mouth Once Daily 16)  Enbrel 50 Mg/ml Soln (  Etanercept) .... Once A Week 17)  Simply Saline 0.9 % Aers (Saline) .Marland Kitchen.. 1-2 Sprays Each Nostril As Needed 18)  Afrin Nasal Spray 0.05 % Soln (Oxymetazoline Hcl) .Marland Kitchen.. 1-2 Sprays Each Nostril As Needed  Current Medications (verified): 1)  Proair Hfa 108 (90 Base) Mcg/act  Aers (Albuterol Sulfate) .Marland Kitchen.. 1-2 Inhalations Qid As Needed For Wheezing... 2)  Nasonex 50 Mcg/act  Susp (Mometasone Furoate) .... 2 Sprays in Each Nostril At Bedtime 3)  Fexofenadine Hcl 180 Mg  Tabs (Fexofenadine Hcl) .... Take 1 By Mouth Once Daily For  Allergies As Needed 4)  Norvasc 5 Mg  Tabs (Amlodipine Besylate) .... Take 1/2 Tab By Mouth Once Daily.Marland KitchenMarland Kitchen 5)  Nexium 40 Mg  Cpdr (Esomeprazole Magnesium) .... Take 1 Tablet By Mouth Once A Day 6)  Miralax   Powd (Polyethylene Glycol 3350) .Marland Kitchen.. 1 Capful in Water Once or Twice Daily As Directed... 7)  Prednisone 5 Mg  Tabs (Prednisone) .... Take As Directed By [name]... 8)  Centrum   Tabs (Multiple Vitamins-Minerals) .... Take 1 Tablet By Mouth Once A Day 9)  Folic Acid 1 Mg  Tabs (Folic Acid) .... Take One Tab By Mouth Two Times A Day As Directed... 10)  Tramadol Hcl 50 Mg  Tabs (Tramadol Hcl) .... Take 1 Every 4 Hours As Needed 11)  Hydrocodone-Acetaminophen 5-500 Mg  Tabs (Hydrocodone-Acetaminophen) .... Take One Tab Every 6-8 H As Needed For Pain... 12)  Mucinex D 60-600 Mg Xr12h-Tab (Pseudoephedrine-Guaifenesin) .... Take 1 Tablets By Mouth Two Times A Day With Lots of Fluids 13)  Levothyroxine Sodium 88 Mcg Tabs (Levothyroxine Sodium) .Marland Kitchen.. 1 Once Daily 14)  Promethazine Hcl 25 Mg Tabs (Promethazine Hcl) .... As Needed 15)  Vitamin D 1000 Unit Tabs (Cholecalciferol) .... Take One Tablet By Mouth Once Daily 16)  Humira 40 Mg/0.19ml Kit (Adalimumab) .... Every 2 Weeks 17)  Simply Saline 0.9 % Aers (Saline) .Marland Kitchen.. 1-2 Sprays Each Nostril As Needed 18)  Afrin Nasal Spray 0.05 % Soln (Oxymetazoline Hcl) .Marland Kitchen.. 1-2 Sprays Each Nostril As Needed  Allergies (verified): No Known Drug Allergies  Past History:  Past Medical History: Last updated: 04/04/2009 ALLERGIC RHINITIS (ICD-477.9) - on ALLEGRA 180mg /d & NASONEX 2spQhs...  ASTHMA (ICD-493.90) -  uses PROAIR as needed...    HYPERTENSION (ICD-401.9) - controlled on Norvasc 5mg /d.Marland Kitchen   Hx of ATRIAL SEPTAL DEFECT (ICD-745.5) - S/P ASD repair 1985... she has seen DrHochrein & DrKlein  in the past... last 2DEcho 7/06 showed tr MR & TR, normal LVF...  ~  Cards f/u 3/10 w/ DrKlein for hx SA node dysfunction- doing OK/ stable rhythm/ good exerc  capacity.  HYPOTHYROIDISM (ICD-244.9) - on Synthroid 57mcg/d now per DrKerr... last TSH here was 1.32 in 2007.  ~  9/09: note from DrKerr reviewed- she had been off Synthroid & TSH was 13.14 w/ Synthroid restarted...  GERD (ICD-530.81) IRRITABLE BOWEL SYNDROME (ICD-564.1)   << GI - followed by DrWalker in [place]  >> CONSTIPATION (ICD-564.00)  RHEUMATOID ARTHRITIS (ICD-714.0)   <<  Rheum -  followed by DrDeveshwar  >> FIBROMYALGIA (ICD-729.1)                       off Pred now, on MTX 6/wk + Folic. LOW BACK PAIN (ICD-724.2)  ANXIETY (ICD-300.00) - not currently taking medication.  ANEMIA (ICD-285.9)  ~  labs 7/09 from [place] showed Hg= 12.4  SEBACEOUS CYST, SCALP (ICD-706.2) - this resolved w/ Keflex... she has a hx of MRSA exposure from  her child and had a MRSA buttock abscess drained by Trecia Rogers 9/08...  Past Surgical History: Last updated: 11/08/2008 tubal ligation ASD repair  Family History: Last updated: 08/03/2008 Positive for RA, hypertension, and diabetes  Social History: Last updated: 08/03/2008 Tobacco Use - No.  Alcohol Use - no Drug Use - no  Risk Factors: Smoking Status: never (08/03/2008)  Review of Systems      See HPI  Vital Signs:  Patient profile:   45 year old female Height:      68 inches Weight:      141.13 pounds BMI:     21.54 O2 Sat:      99 % on Room air Temp:     97.8 degrees F oral Pulse rate:   51 / minute BP sitting:   142 / 94  (left arm) Cuff size:   regular  Vitals Entered By: Boone Master CNA (July 25, 2009 12:06 PM)  O2 Flow:  Room air CC: sinus inf: sinus pressure/congestion with dark yellow drainage, nausea associated with this PND, HA and pressure in left ear.  states breathing is okay., Headache Is Patient Diabetic? No Comments Medications reviewed with patient Daytime contact number verified with patient. Boone Master CNA  July 25, 2009 12:06 PM    Physical Exam  Additional Exam:  WD, Thin, [name] in  NAD... GENERAL:  Alert & oriented; pleasant & cooperative. HEENT:  [place]/AT, EOM-wnl, PERRLA, EACs-clear, TMs-wnl, NOSE- red/swollen mucosa, clear discharge. Max tenderness,   THROAT-clear & wnl. NECK: no JVD; normal carotid impulses w/o bruits; no thyromegaly or nodules palpated; no lymphadenopathy. CHEST:  Clear to P & A; without wheezes/ rales/ or rhonchi. HEART:  Regular Rhythm;  gr 1-2 SEM without rubs or gallops... ABDOMEN:  Soft & nontender; normal bowel sounds; no organomegaly or masses detected. EXT: without deformities, mild arthritic changes; no varicose veins/ venous insuffic/ or edema. DERM:  No lesions noted; no rash etc...     Impression & Recommendations:  Problem # 1:  ACUTE SINUSITIS, UNSPECIFIED (ICD-461.9)  REC:  Zpack take as directed.  Mucinex two times a day as needed congestion Saline nasal rinses as needed  Increase fluids.  follow up. Dr. Kriste Basque in 3 months for CPX  Please contact office for sooner follow up if symptoms do not improve or worsen  Her updated medication list for this problem includes:    Nasonex 50 Mcg/act Susp (Mometasone furoate) .Marland Kitchen... 2 sprays in each nostril at bedtime    Mucinex D 60-600 Mg Xr12h-tab (Pseudoephedrine-guaifenesin) .Marland Kitchen... Take 1 tablets by mouth two times a day with lots of fluids    Simply Saline 0.9 % Aers (Saline) .Marland Kitchen... 1-2 sprays each nostril as needed    Afrin Nasal Spray 0.05 % Soln (Oxymetazoline hcl) .Marland Kitchen... 1-2 sprays each nostril as needed    Zithromax Z-pak 250 Mg Tabs (Azithromycin) .Marland Kitchen... Take as directed.  Orders: Est. Patient Level III ([address])  Medications Added to Medication List This Visit: 1)  Humira 40 Mg/0.72ml Kit (Adalimumab) .... Every 2 weeks 2)  Zithromax Z-pak 250 Mg Tabs (Azithromycin) .... Take as directed.  Complete Medication List: 1)  Proair Hfa 108 (90 Base) Mcg/act Aers (Albuterol sulfate) .Marland Kitchen.. 1-2 inhalations qid as needed for wheezing... 2)  Nasonex 50 Mcg/act Susp (Mometasone furoate) ....  2 sprays in each nostril at bedtime 3)  Fexofenadine Hcl 180 Mg Tabs (Fexofenadine hcl) .... Take 1 by mouth once daily for allergies as needed 4)  Norvasc 5 Mg Tabs (Amlodipine  besylate) .... Take 1/2 tab by mouth once daily.Marland KitchenMarland Kitchen 5)  Nexium 40 Mg Cpdr (Esomeprazole magnesium) .... Take 1 tablet by mouth once a day 6)  Miralax Powd (Polyethylene glycol 3350) .Marland Kitchen.. 1 capful in water once or twice daily as directed... 7)  Prednisone 5 Mg Tabs (Prednisone) .... Take as directed by drdeveshwar... 8)  Centrum Tabs (Multiple vitamins-minerals) .... Take 1 tablet by mouth once a day 9)  Folic Acid 1 Mg Tabs (Folic acid) .... Take one tab by mouth two times a day as directed... 10)  Tramadol Hcl 50 Mg Tabs (Tramadol hcl) .... Take 1 every 4 hours as needed 11)  Hydrocodone-acetaminophen 5-500 Mg Tabs (Hydrocodone-acetaminophen) .... Take one tab every 6-8 h as needed for pain... 12)  Mucinex D 60-600 Mg Xr12h-tab (Pseudoephedrine-guaifenesin) .... Take 1 tablets by mouth two times a day with lots of fluids 13)  Levothyroxine Sodium 88 Mcg Tabs (Levothyroxine sodium) .Marland Kitchen.. 1 once daily 14)  Promethazine Hcl 25 Mg Tabs (Promethazine hcl) .... As needed 15)  Vitamin D 1000 Unit Tabs (Cholecalciferol) .... Take one tablet by mouth once daily 16)  Humira 40 Mg/0.84ml Kit (Adalimumab) .... Every 2 weeks 17)  Simply Saline 0.9 % Aers (Saline) .Marland Kitchen.. 1-2 sprays each nostril as needed 18)  Afrin Nasal Spray 0.05 % Soln (Oxymetazoline hcl) .Marland Kitchen.. 1-2 sprays each nostril as needed 19)  Zithromax Z-pak 250 Mg Tabs (Azithromycin) .... Take as directed.  Patient Instructions: 1)  Zpack take as directed.  2)  Mucinex two times a day as needed congestion 3)  Saline nasal rinses as needed  4)  Increase fluids.  5)  follow up. Dr. Kriste Basque in 3 months for CPX  6)  Please contact office for sooner follow up if symptoms do not improve or worsen  Prescriptions: ZITHROMAX Z-PAK 250 MG TABS (AZITHROMYCIN) take as directed.  #1 x  0   Entered and Authorized by:   Rubye Oaks NP   Signed by:   Rubye Oaks NP on 07/25/2009   Method used:   [facility] to        [id number]       [address]       [place], [place]  [address]       Ph: [phone] or [phone]       Fax: [phone]   RxID:   [id number]

## 2010-06-25 ENCOUNTER — Ambulatory Visit: Payer: Self-pay | Admitting: Cardiology

## 2010-06-27 ENCOUNTER — Encounter: Payer: Self-pay | Admitting: Pulmonary Disease

## 2010-07-02 ENCOUNTER — Telehealth: Payer: Self-pay | Admitting: Internal Medicine

## 2010-07-04 ENCOUNTER — Ambulatory Visit (INDEPENDENT_AMBULATORY_CARE_PROVIDER_SITE_OTHER): Payer: BC Managed Care – PPO | Admitting: Adult Health

## 2010-07-04 ENCOUNTER — Encounter: Payer: Self-pay | Admitting: Adult Health

## 2010-07-04 DIAGNOSIS — K219 Gastro-esophageal reflux disease without esophagitis: Secondary | ICD-10-CM

## 2010-07-09 NOTE — Progress Notes (Signed)
Summary: Right arm pain and tingling   Phone Note Call from Patient Call back at Home Phone [phone]   Caller: Patient Reason for Call: Talk to Nurse, Talk to Doctor Summary of Call: pt having chest discomfort and pain in her right arm when she raises it up pt is a little SOB pt is having tingleing feelings Initial call taken by: Omer Jack,  July 02, 2010 12:31 PM  Follow-up for Phone Call        I spoke with the pt and she c/o constant hurting on the left side of chest.  This is the same symptom that she was having in September at her OV with Dr Graciela Husbands.  The pt called today because of  pain and tingling under her right arm.  The pt said that 10 minutes ago she raised her right arm above her head and she developed a sudden pain under her right arm and tingling down arm.  These symptoms have improved since the pt has not moved right arm.  The pt denies any injury to this arm.  I made the pt aware that these symptoms are not cardiac and can be coming from the muscle or nerve in arm.  I advised the pt to contact Dr Jodelle Green office to see if they recommend any follow-up.  This is the first time the pt has had this problem with movement of her right arm.  Pt agreed with plan.    Follow-up by: Julieta Gutting, RN, BSN,  July 02, 2010 12:38 PM

## 2010-07-15 NOTE — Letter (Signed)
[facility]   Imported By: Sherian Rein 07/08/2010 11:41:28  _____________________________________________________________________  External Attachment:    Type:   Image     Comment:   External Document

## 2010-07-15 NOTE — Assessment & Plan Note (Signed)
Summary: Acute NP office visit    Primary Provider/Referring Provider:  nadel  CC:  severe reflux episode .  History of Present Illness: 45 year old BF hx of  RA and Fibromyalgia and is followed by [facility] for rheumatology    ~  November 08, 2008:  she has been followed over the last year by [employer], DrKerr, and DrKlein... returns today w/ c/o headaches that occur monthly just after onset on menses- frontal/ temples/ and posterior band-like pain> likely mixed muscle contraction/ migraine headaches... she has Midrin & this helps the pain when she takes it... ** we discussed starting the Midrin 1-2/d w/ the onset of menses to prevent the onset of these headaches- she will let me know how this works.  April 04, 2009--Presents for work in visit. Complains of something in throat. She feels like she has a hole in her throat that causes SOB with talking. Seen by GI and had  had EGD 6 weeks ago by  [facility], found mild reflux per pt. Rec to continue on nexium. Sensation is not better. Prolonged talking feels like something is in throat, "feels lke she has hole in throat"  No cough, drainage, dyspnea, vomitting. Started Enbrel 3 weeks ago. Had blood work this am at [facility].   July 25, 2009 --Presents for an acute work in visit. Complains of sinus inf: sinus pressure/congestion with dark yellow drainage, nausea associated with this PND, HA and pressure in left ear.  states breathing is okay.Marland Kitchen Head hurts when she bends forward. OTC not helping. Symptoms going on for last week, worse for 2 days. Denies chest pain, dyspnea, orthopnea, hemoptysis, fever, n/v/d, edema, headache.   July 04, 2010 --Presents for an acute office viist. Complains 2 weeks had episode of reflux up in throat , since then has sore throat,  soreness in chest. On prilosec two times a day but does not think it works sometimes.Not on strict GERD diet. and eats late sometimes. No choking or dysphagia.. no bloody stools.  Denies chest pain, dyspnea, orthopnea, hemoptysis, fever, n/v/d, edema, headache, discolored mucus.   Medications Prior to Update: 1)  Ocean Nasal Spray 0.65 % Soln (Saline) .Marland Kitchen.. 1-2 Sprays Each Nostril As Needed 2)  Flonase 50 Mcg/act Susp (Fluticasone Propionate) .... 2 Sprays Each Nostril Two Times A Day 3)  Proair Hfa 108 (90 Base) Mcg/act  Aers (Albuterol Sulfate) .Marland Kitchen.. 1-2 Inhalations Qid As Needed For Wheezing... 4)  Omeprazole 40 Mg Cpdr (Omeprazole) .Marland Kitchen.. 1 Tab By Mouth Two Times A Day 5)  Levothyroxine Sodium 75 Mcg Tabs (Levothyroxine Sodium) .... Take 1 Tab By Mouth Once Daily.Marland KitchenMarland Kitchen 6)  Topiramate 25 Mg Tabs (Topiramate) .... 3 Tabs By Mouth Once Daily 7)  Tizanidine Hcl 4 Mg Tabs (Tizanidine Hcl) .... As Needed 8)  Promethazine Hcl 25 Mg Tabs (Promethazine Hcl) .... As Needed 9)  Centrum   Tabs (Multiple Vitamins-Minerals) .... Take 1 Tablet By Mouth Once A Day 10)  Vitamin D 1000 Unit Tabs (Cholecalciferol) .... Take One Tablet By Mouth Once Daily  Current Medications (verified): 1)  Ocean Nasal Spray 0.65 % Soln (Saline) .Marland Kitchen.. 1-2 Sprays Each Nostril As Needed 2)  Flonase 50 Mcg/act Susp (Fluticasone Propionate) .... 2 Sprays Each Nostril Two Times A Day 3)  Proair Hfa 108 (90 Base) Mcg/act  Aers (Albuterol Sulfate) .Marland Kitchen.. 1-2 Inhalations Qid As Needed For Wheezing... 4)  Omeprazole 40 Mg Cpdr (Omeprazole) .Marland Kitchen.. 1 Tab By Mouth Two Times A Day 5)  Levothyroxine Sodium 75 Mcg  Tabs (Levothyroxine Sodium) .... Take 1 Tab By Mouth Once Daily.Marland KitchenMarland Kitchen 6)  Topiramate 25 Mg Tabs (Topiramate) .... 3 Tabs By Mouth Once Daily 7)  Tizanidine Hcl 4 Mg Tabs (Tizanidine Hcl) .Marland Kitchen.. 1 Tab By Mouth Every 8 Hours As Needed - Do Not Exceed 3 Doses in A 24hour Period 8)  Promethazine Hcl 25 Mg Tabs (Promethazine Hcl) .... As Needed 9)  Centrum   Tabs (Multiple Vitamins-Minerals) .... Take 1 Tablet By Mouth Once A Day 10)  Vitamin D 1000 Unit Tabs (Cholecalciferol) .... Take One Tablet By Mouth Once Daily 11)   Zyrtec Allergy 10 Mg Tabs (Cetirizine Hcl) .... Take 1 Tablet By Mouth Once A Day 12)  Mucinex D 60-600 Mg Xr12h-Tab (Pseudoephedrine-Guaifenesin) .... Take 1-2 Tablets Every 12 Hours As Needed 13)  Eq Ibuprofen 200 Mg Tabs (Ibuprofen) .... Per Bottle  Allergies (verified): No Known Drug Allergies  Past History:  Past Medical History: Last updated: 03/25/2010 ALLERGIC RHINITIS (ICD-477.9) ASTHMA (ICD-493.90) HYPERTENSION (ICD-401.9) CHEST PAIN (ICD-786.50) Hx of ATRIAL SEPTAL DEFECT (ICD-745.5) SINOATRIAL NODE DYSFUNCTION (ICD-427.81) HYPOTHYROIDISM (ICD-244.9) GERD (ICD-530.81) IRRITABLE BOWEL SYNDROME (ICD-564.1) CONSTIPATION (ICD-564.00) HEMATURIA UNSPECIFIED (ICD-599.70) RHEUMATOID ARTHRITIS (ICD-714.0) FIBROMYALGIA (ICD-729.1) LOW BACK PAIN (ICD-724.2) HEADACHE, CHRONIC (ICD-784.0) ANXIETY (ICD-300.00) ANEMIA (ICD-285.9) SEBACEOUS CYST, SCALP (ICD-706.2)  Past Surgical History: Last updated: 03/25/2010 S/P tubal ligation S/P ASD repair  Family History: Last updated: 01/22/2010 Positive for RA, hypertension, and diabetes  Social History: Last updated: 08/03/2008 Tobacco Use - No.  Alcohol Use - no Drug Use - no  Risk Factors: Smoking Status: never (03/25/2010)  Review of Systems      See HPI  Vital Signs:  Patient profile:   45 year old female Height:      68 inches Weight:      143.25 pounds BMI:     21.86 O2 Sat:      100 % on Room air Temp:     97.4 degrees F oral Pulse rate:   46 / minute BP sitting:   140 / 70  Vitals Entered By: Boone Master CNA/MA (July 04, 2010 12:05 PM)  O2 Flow:  Room air CC: severe reflux episode  Is Patient Diabetic? No Comments Medications reviewed with patient Daytime contact number verified with patient. Boone Master CNA/MA  July 04, 2010 12:06 PM    Physical Exam  Additional Exam:  WD, Thin, [name] in NAD... GENERAL:  Alert & oriented; pleasant & cooperative. HEENT:  [place]/AT, , EACs-clear,  TMs-wnl, NOSE- clear discharge.  THROAT-clear & wnl. NECK: no JVD; normal carotid impulses w/o bruits; no thyromegaly or nodules palpated; no lymphadenopathy. CHEST:  Clear to P & A; without wheezes/ rales/ or rhonchi. HEART:  Regular Rhythm;  gr 1-2 SEM without rubs or gallops... ABDOMEN:  Soft & nontender; normal bowel sounds; no organomegaly or masses detected., no guarding or rebound  EXT: without deformities, mild arthritic changes; no varicose veins/ venous insuffic/ or edema. DERM:  No lesions noted; no rash etc...     Impression & Recommendations:  Problem # 1:  GERD (ICD-530.81)  Flare  Plan : Use Nexium 40mg  in place of Omeprazole until samples gone.then resume Omeprazole 20mg  two times a day. GERD diet -strict No eating 3-4 hr prior to at bedtime  Saline nasal rinse as needed Add Pepcid 20mg  at bedtime  follow up Dr. Kriste Basque in 3 weeks as planned and as needed  Please contact office for sooner follow up if symptoms do not improve or worsen  Her updated medication  list for this problem includes:    Omeprazole 40 Mg Cpdr (Omeprazole) .Marland Kitchen... 1 tab by mouth two times a day  Orders: Est. Patient Level III (04540)  Medications Added to Medication List This Visit: 1)  Tizanidine Hcl 4 Mg Tabs (Tizanidine hcl) .Marland Kitchen.. 1 tab by mouth every 8 hours as needed - do not exceed 3 doses in a 24hour period 2)  Zyrtec Allergy 10 Mg Tabs (Cetirizine hcl) .... Take 1 tablet by mouth once a day 3)  Mucinex D 60-600 Mg Xr12h-tab (Pseudoephedrine-guaifenesin) .... Take 1-2 tablets every 12 hours as needed 4)  Eq Ibuprofen 200 Mg Tabs (Ibuprofen) .... Per bottle  Patient Instructions: 1)  Use Nexium 40mg  in place of Omeprazole until samples gone.then resume Omeprazole 20mg  two times a day. 2)  GERD diet -strict 3)  No eating 3-4 hr prior to at bedtime  4)  Saline nasal rinse as needed 5)  Add Pepcid 20mg  at bedtime  6)  follow up Dr. Kriste Basque in 3 weeks as planned and as needed  7)  Please  contact office for sooner follow up if symptoms do not improve or worsen

## 2010-07-23 ENCOUNTER — Encounter: Payer: Self-pay | Admitting: Pulmonary Disease

## 2010-07-23 ENCOUNTER — Ambulatory Visit (INDEPENDENT_AMBULATORY_CARE_PROVIDER_SITE_OTHER): Payer: BC Managed Care – PPO | Admitting: Pulmonary Disease

## 2010-07-23 DIAGNOSIS — J329 Chronic sinusitis, unspecified: Secondary | ICD-10-CM

## 2010-07-23 DIAGNOSIS — Q2111 Secundum atrial septal defect: Secondary | ICD-10-CM

## 2010-07-23 DIAGNOSIS — J309 Allergic rhinitis, unspecified: Secondary | ICD-10-CM

## 2010-07-23 DIAGNOSIS — E039 Hypothyroidism, unspecified: Secondary | ICD-10-CM

## 2010-07-23 DIAGNOSIS — I10 Essential (primary) hypertension: Secondary | ICD-10-CM

## 2010-07-23 DIAGNOSIS — J45909 Unspecified asthma, uncomplicated: Secondary | ICD-10-CM

## 2010-07-23 DIAGNOSIS — Q211 Atrial septal defect: Secondary | ICD-10-CM

## 2010-07-23 DIAGNOSIS — K219 Gastro-esophageal reflux disease without esophagitis: Secondary | ICD-10-CM

## 2010-07-23 DIAGNOSIS — Z23 Encounter for immunization: Secondary | ICD-10-CM

## 2010-07-24 ENCOUNTER — Other Ambulatory Visit: Payer: Self-pay | Admitting: Pulmonary Disease

## 2010-07-24 ENCOUNTER — Other Ambulatory Visit: Payer: BC Managed Care – PPO

## 2010-07-24 ENCOUNTER — Encounter (INDEPENDENT_AMBULATORY_CARE_PROVIDER_SITE_OTHER): Payer: Self-pay | Admitting: *Deleted

## 2010-07-24 DIAGNOSIS — D649 Anemia, unspecified: Secondary | ICD-10-CM

## 2010-07-24 DIAGNOSIS — I10 Essential (primary) hypertension: Secondary | ICD-10-CM

## 2010-07-24 DIAGNOSIS — R748 Abnormal levels of other serum enzymes: Secondary | ICD-10-CM

## 2010-07-24 DIAGNOSIS — E039 Hypothyroidism, unspecified: Secondary | ICD-10-CM

## 2010-07-24 DIAGNOSIS — E7800 Pure hypercholesterolemia, unspecified: Secondary | ICD-10-CM

## 2010-07-24 DIAGNOSIS — N3000 Acute cystitis without hematuria: Secondary | ICD-10-CM

## 2010-07-24 LAB — HEPATIC FUNCTION PANEL
ALT: 13 U/L (ref 0–35)
AST: 24 U/L (ref 0–37)
Albumin: 4.4 g/dL (ref 3.5–5.2)
Alkaline Phosphatase: 42 U/L (ref 39–117)
Bilirubin, Direct: 0.2 mg/dL (ref 0.0–0.3)
Total Bilirubin: 1 mg/dL (ref 0.3–1.2)
Total Protein: 8.2 g/dL (ref 6.0–8.3)

## 2010-07-24 LAB — URINALYSIS
Bilirubin Urine: NEGATIVE
Hgb urine dipstick: NEGATIVE
Leukocytes, UA: NEGATIVE
Nitrite: NEGATIVE
Specific Gravity, Urine: 1.015 (ref 1.000–1.030)
Total Protein, Urine: NEGATIVE
Urine Glucose: NEGATIVE
Urobilinogen, UA: 0.2 (ref 0.0–1.0)
pH: 6 (ref 5.0–8.0)

## 2010-07-24 LAB — CBC WITH DIFFERENTIAL/PLATELET
Basophils Absolute: 0 10*3/uL (ref 0.0–0.1)
Basophils Relative: 0 % (ref 0.0–3.0)
Eosinophils Absolute: 0 10*3/uL (ref 0.0–0.7)
Eosinophils Relative: 0.1 % (ref 0.0–5.0)
HCT: 38.2 % (ref 36.0–46.0)
Hemoglobin: 12.8 g/dL (ref 12.0–15.0)
Lymphocytes Relative: 14.5 % (ref 12.0–46.0)
Lymphs Abs: 0.7 10*3/uL (ref 0.7–4.0)
MCHC: 33.5 g/dL (ref 30.0–36.0)
MCV: 88.4 fl (ref 78.0–100.0)
Monocytes Absolute: 0.2 10*3/uL (ref 0.1–1.0)
Monocytes Relative: 4.2 % (ref 3.0–12.0)
Neutro Abs: 4 10*3/uL (ref 1.4–7.7)
Neutrophils Relative %: 81.2 % — ABNORMAL HIGH (ref 43.0–77.0)
Platelets: 156 10*3/uL (ref 150.0–400.0)
RBC: 4.31 Mil/uL (ref 3.87–5.11)
RDW: 13.4 % (ref 11.5–14.6)
WBC: 4.9 10*3/uL (ref 4.5–10.5)

## 2010-07-24 LAB — BASIC METABOLIC PANEL
BUN: 12 mg/dL (ref 6–23)
CO2: 23 mEq/L (ref 19–32)
Calcium: 9.4 mg/dL (ref 8.4–10.5)
Chloride: 105 mEq/L (ref 96–112)
Creatinine, Ser: 0.9 mg/dL (ref 0.4–1.2)
GFR: 88.71 mL/min (ref >60.00)
Glucose, Bld: 88 mg/dL (ref 70–99)
Potassium: 4 mEq/L (ref 3.5–5.1)
Sodium: 136 mEq/L (ref 135–145)

## 2010-07-24 LAB — LIPID PANEL
Cholesterol: 145 mg/dL (ref 0–200)
HDL: 59.1 mg/dL (ref >39.00)
LDL Cholesterol: 77 mg/dL (ref 0–99)
Total CHOL/HDL Ratio: 2
Triglycerides: 44 mg/dL (ref 0.0–149.0)
VLDL: 8.8 mg/dL (ref 0.0–40.0)

## 2010-07-24 LAB — TSH: TSH: 1.53 u[IU]/mL (ref 0.35–5.50)

## 2010-07-29 ENCOUNTER — Other Ambulatory Visit: Payer: Self-pay | Admitting: Internal Medicine

## 2010-07-29 ENCOUNTER — Ambulatory Visit (INDEPENDENT_AMBULATORY_CARE_PROVIDER_SITE_OTHER): Payer: BC Managed Care – PPO | Admitting: Internal Medicine

## 2010-07-29 ENCOUNTER — Encounter: Payer: Self-pay | Admitting: Internal Medicine

## 2010-07-29 DIAGNOSIS — I498 Other specified cardiac arrhythmias: Secondary | ICD-10-CM

## 2010-07-29 DIAGNOSIS — Z01810 Encounter for preprocedural cardiovascular examination: Secondary | ICD-10-CM

## 2010-07-29 DIAGNOSIS — R9431 Abnormal electrocardiogram [ECG] [EKG]: Secondary | ICD-10-CM

## 2010-07-29 DIAGNOSIS — I495 Sick sinus syndrome: Secondary | ICD-10-CM

## 2010-07-29 DIAGNOSIS — Q2111 Secundum atrial septal defect: Secondary | ICD-10-CM

## 2010-07-29 DIAGNOSIS — Q211 Atrial septal defect: Secondary | ICD-10-CM

## 2010-07-29 DIAGNOSIS — R072 Precordial pain: Secondary | ICD-10-CM

## 2010-07-29 DIAGNOSIS — R079 Chest pain, unspecified: Secondary | ICD-10-CM

## 2010-07-29 LAB — BASIC METABOLIC PANEL
BUN: 14 mg/dL (ref 6–23)
CO2: 24 mEq/L (ref 19–32)
Calcium: 9.3 mg/dL (ref 8.4–10.5)
Chloride: 107 mEq/L (ref 96–112)
Creatinine, Ser: 0.9 mg/dL (ref 0.4–1.2)
GFR: 87.56 mL/min (ref >60.00)
Glucose, Bld: 95 mg/dL (ref 70–99)
Potassium: 4.3 mEq/L (ref 3.5–5.1)
Sodium: 139 mEq/L (ref 135–145)

## 2010-07-29 LAB — CBC WITH DIFFERENTIAL/PLATELET
Basophils Absolute: 0 10*3/uL (ref 0.0–0.1)
Basophils Relative: 0.5 % (ref 0.0–3.0)
Eosinophils Absolute: 0.1 10*3/uL (ref 0.0–0.7)
Eosinophils Relative: 2.4 % (ref 0.0–5.0)
HCT: 36.6 % (ref 36.0–46.0)
Hemoglobin: 12 g/dL (ref 12.0–15.0)
Lymphocytes Relative: 34 % (ref 12.0–46.0)
Lymphs Abs: 1.5 10*3/uL (ref 0.7–4.0)
MCHC: 32.8 g/dL (ref 30.0–36.0)
MCV: 88.8 fl (ref 78.0–100.0)
Monocytes Absolute: 0.6 10*3/uL (ref 0.1–1.0)
Monocytes Relative: 13.8 % — ABNORMAL HIGH (ref 3.0–12.0)
Neutro Abs: 2.2 10*3/uL (ref 1.4–7.7)
Neutrophils Relative %: 49.3 % (ref 43.0–77.0)
Platelets: 149 10*3/uL — ABNORMAL LOW (ref 150.0–400.0)
RBC: 4.12 Mil/uL (ref 3.87–5.11)
RDW: 13.7 % (ref 11.5–14.6)
WBC: 4.5 10*3/uL (ref 4.5–10.5)

## 2010-07-29 LAB — PROTIME-INR
INR: 1 ratio (ref 0.8–1.0)
Prothrombin Time: 11.7 s (ref 10.2–12.4)

## 2010-07-29 LAB — APTT: aPTT: 24.2 s (ref 21.7–28.8)

## 2010-07-31 ENCOUNTER — Inpatient Hospital Stay (HOSPITAL_BASED_OUTPATIENT_CLINIC_OR_DEPARTMENT_OTHER)
Admission: RE | Admit: 2010-07-31 | Discharge: 2010-07-31 | Disposition: A | Discharge status: Home / Self Care | Payer: BC Managed Care – PPO | Source: Ambulatory Visit | Attending: Cardiology | Admitting: Cardiology

## 2010-07-31 DIAGNOSIS — E039 Hypothyroidism, unspecified: Secondary | ICD-10-CM | POA: Insufficient documentation

## 2010-07-31 DIAGNOSIS — R0602 Shortness of breath: Secondary | ICD-10-CM | POA: Insufficient documentation

## 2010-07-31 DIAGNOSIS — R079 Chest pain, unspecified: Secondary | ICD-10-CM | POA: Insufficient documentation

## 2010-08-05 NOTE — Letter (Signed)
Summary: Cardiac Catheterization Instructions- [facility]  [facility], Main Office  [address]   [place], [place] [address]   Phone: [phone]  Fax: [phone]     07/29/2010 MRN: [id number]  Brittany Hodges [address] [place], [place]  [address]  [place]  Dear Ms. Asa,   You are scheduled for a Cardiac Catheterization on ___3/15/12______ with Dr.___STUCKEY___________  Please arrive to the 1st floor of the Heart and V[facility] Center at [facility] at _10:30____ am  on the day of your procedure. Please do not arrive before 6:30 a.m. Call the Heart and Vascular Center at [phone] if you are unable to make your appointmnet. The Code to get into the parking garage under the building is_3000_______. Take the elevators to the 1st floor. You must have someone to drive you home. Someone must be with you for the first 24 hours after you arrive home. Please wear clothes that are easy to get on and off and wear slip-on shoes. Do not eat or drink after midnight except water with your medications that morning. Bring all your medications and current insurance cards with you.     ___ You may take ALL of your medications with water that morning. ________________________________________________________________________________________________________________________________   ___ Eilene Ghazi instructions:  _______________________________________________________________________________________________________________________________  The usual length of stay after your procedure is 2 to 3 hours. This can vary.  If you have any questions, please call the office at the number listed above.  DR Brett Canales Joyann Spidle/ Scherrie Bateman, LPN

## 2010-08-05 NOTE — Assessment & Plan Note (Signed)
Summary: FOLLOW UP - 2 YEARS/mt      Allergies Added: NKDA  Primary Provider:  nadel  CC:  office visit.  Pt states she has had chest pain and weakness since last visit.  Brittany Hodges  History of Present Illness: Brittany Hodges is seen in followup for junctional rhythm in the setting of atrial septal defect repair. In the past she has had assessments of contropic competence which have been adequate. However, she was  having more problems with exertional shortness of breath and fatigue. Resting heart rates have been identified in the low 40 ;  her TSH was noted to be mildly elevated; levothyroxine dose was increased .  She undeerwent myoview scanning inn the fall 2011 demonstrating mild ischemia v attenusation with ef 52% and min LV dilitation. "following increase in thyroid replacement her symptoms improved.  She continues however to have chest discomfort. This is described as left-sided; it is not necessarily related to exertion.   recently LDL was 77 and HDL was 59.1  Current Medications (verified): 1)  Zyrtec Allergy 10 Mg Tabs (Cetirizine Hcl) .... Take 1 Tablet By Mouth Once A Day 2)  Ocean Nasal Spray 0.65 % Soln (Saline) .Brittany Hodges.. 1-2 Sprays Each Nostril As Needed 3)  Mucinex D 60-600 Mg Xr12h-Tab (Pseudoephedrine-Guaifenesin) .... Take 1-2 Tablets Every 12 Hours As Needed 4)  Proair Hfa 108 (90 Base) Mcg/act  Aers (Albuterol Sulfate) .Brittany Hodges.. 1-2 Inhalations Qid As Needed For Wheezing... 5)  Omeprazole 40 Mg Cpdr (Omeprazole) .Brittany Hodges.. 1 Tab By Mouth Two Times A Day 6)  Levothyroxine Sodium 75 Mcg Tabs (Levothyroxine Sodium) .... Take 1 Tab By Mouth Once Daily.Brittany Hodges 7)  Topiramate 25 Mg Tabs (Topiramate) .... 3 Tabs By Mouth Once Daily 8)  Tizanidine Hcl 4 Mg Tabs (Tizanidine Hcl) .Brittany Hodges.. 1 Tab By Mouth Every 8 Hours As Needed - Do Not Exceed 3 Doses in A 24hour Period 9)  Centrum   Tabs (Multiple Vitamins-Minerals) .... Take 1 Tablet By Mouth Once A Day 10)  Vitamin D 1000 Unit Tabs (Cholecalciferol) .... Take  One Tablet By Mouth Once Daily 11)  Eq Ibuprofen 200 Mg Tabs (Ibuprofen) .... Per Bottle 12)  Augmentin 875-125 Mg Tabs (Amoxicillin-Pot Clavulanate) .... Take 1 Tab By Mouth Two Times A Day... 13)  Prednisone (Pak) 5 Mg Tabs (Prednisone) .... Take As Directed...  Allergies (verified): No Known Drug Allergies  Past History:  Past Medical History: Last updated: 03/25/2010 ALLERGIC RHINITIS (ICD-477.9) ASTHMA (ICD-493.90) HYPERTENSION (ICD-401.9) CHEST PAIN (ICD-786.50) Hx of ATRIAL SEPTAL DEFECT (ICD-745.5) SINOATRIAL NODE DYSFUNCTION (ICD-427.81) HYPOTHYROIDISM (ICD-244.9) GERD (ICD-530.81) IRRITABLE BOWEL SYNDROME (ICD-564.1) CONSTIPATION (ICD-564.00) HEMATURIA UNSPECIFIED (ICD-599.70) RHEUMATOID ARTHRITIS (ICD-714.0) FIBROMYALGIA (ICD-729.1) LOW BACK PAIN (ICD-724.2) HEADACHE, CHRONIC (ICD-784.0) ANXIETY (ICD-300.00) ANEMIA (ICD-285.9) SEBACEOUS CYST, SCALP (ICD-706.2)  Past Surgical History: Last updated: 03/25/2010 S/P tubal ligation S/P ASD repair  Family History: Last updated: 01/22/2010 Positive for RA, hypertension, and diabetes  Social History: Last updated: 08/03/2008 Tobacco Use - No.  Alcohol Use - no Drug Use - no  Vital Signs:  Patient profile:   45 year old female Height:      68 inches Weight:      144 pounds BMI:     21.97 Pulse rate:   48 / minute Pulse rhythm:   irregular BP sitting:   124 / 72  (right arm) Cuff size:   regular  Vitals Entered By: Judithe Modest CMA (July 29, 2010 9:33 AM)  Physical Exam  General:  The patient was alert and oriented in no  acute distress. HEENT Normal.  Neck veins were flat, carotids were brisk.  Lungs were clear.  Heart sounds were slow but regular Abdomen was soft with active bowel sounds. There is no clubbing cyanosis or edema. Skin Warm and dry    EKG  Procedure date:  07/29/2010  Findings:      sinus rhythm at 40 with competing junctional rhythm and occasional PAC no acute ST-T  changes  Impression & Recommendations:  Problem # 1:  CHEST PAIN (ICD-786.50) she continues to have chest pain. In the context of her abnormal Myoview, I think we are now compelled to pursue catheterization to exclude coronary disease as the cause. She is also very anxious about the chest pain over the kidneys and may be related to her fibromyalgia. We discussed potential benefits as well as risks. She had a prior catheterization many many years ago prior to her ASD repair  Problem # 2:  Hx of ATRIAL SEPTAL DEFECT (ICD-745.5) as above  Problem # 3:  SINOATRIAL NODE DYSFUNCTION (ICD-427.81) she continues with sinus node dysfunction and chronotropic competence. She also had a hypertensive reaction to exercise. SHe is exercising now daily. We'll continue to observe her rhythm  Other Orders: TLB-BMP (Basic Metabolic Panel-BMET) (80048-METABOL) TLB-CBC Platelet - w/Differential (85025-CBCD) TLB-PT (Protime) (85610-PTP) TLB-PTT (85730-PTTL)  Patient Instructions: 1)  Your physician recommends that you schedule a follow-up appointment in: PENDING AFTER HEART CATH 2)  Your physician recommends that you continue on your current medications as directed. Please refer to the Current Medication list given to you today. 3)  Your physician has requested that you have a cardiac catheterization.  Cardiac catheterization is used to diagnose and/or treat various heart conditions. Doctors may recommend this procedure for a number of different reasons. The most common reason is to evaluate chest pain. Chest pain can be a symptom of coronary artery disease (CAD), and cardiac catheterization can show whether plaque is narrowing or blocking your heart's arteries. This procedure is also used to evaluate the valves, as well as measure the blood flow and oxygen levels in different parts of your heart.  For further information please visit [email or web address]  Please follow instruction sheet, as given.

## 2010-08-06 ENCOUNTER — Other Ambulatory Visit (HOSPITAL_COMMUNITY): Payer: Self-pay | Admitting: Radiology

## 2010-08-06 DIAGNOSIS — Z8774 Personal history of (corrected) congenital malformations of heart and circulatory system: Secondary | ICD-10-CM

## 2010-08-07 ENCOUNTER — Ambulatory Visit (HOSPITAL_COMMUNITY): Payer: BC Managed Care – PPO | Attending: Cardiology | Admitting: Radiology

## 2010-08-07 ENCOUNTER — Encounter: Payer: Self-pay | Admitting: Physician Assistant

## 2010-08-07 DIAGNOSIS — Z8774 Personal history of (corrected) congenital malformations of heart and circulatory system: Secondary | ICD-10-CM

## 2010-08-07 DIAGNOSIS — I059 Rheumatic mitral valve disease, unspecified: Secondary | ICD-10-CM | POA: Insufficient documentation

## 2010-08-07 DIAGNOSIS — R0609 Other forms of dyspnea: Secondary | ICD-10-CM | POA: Insufficient documentation

## 2010-08-07 DIAGNOSIS — J45909 Unspecified asthma, uncomplicated: Secondary | ICD-10-CM | POA: Insufficient documentation

## 2010-08-07 DIAGNOSIS — R079 Chest pain, unspecified: Secondary | ICD-10-CM | POA: Insufficient documentation

## 2010-08-07 DIAGNOSIS — I253 Aneurysm of heart: Secondary | ICD-10-CM | POA: Insufficient documentation

## 2010-08-07 DIAGNOSIS — R072 Precordial pain: Secondary | ICD-10-CM

## 2010-08-07 DIAGNOSIS — R0989 Other specified symptoms and signs involving the circulatory and respiratory systems: Secondary | ICD-10-CM | POA: Insufficient documentation

## 2010-08-07 DIAGNOSIS — I10 Essential (primary) hypertension: Secondary | ICD-10-CM | POA: Insufficient documentation

## 2010-08-08 ENCOUNTER — Telehealth: Payer: Self-pay | Admitting: Pulmonary Disease

## 2010-08-08 ENCOUNTER — Emergency Department (HOSPITAL_BASED_OUTPATIENT_CLINIC_OR_DEPARTMENT_OTHER)
Admission: EM | Admit: 2010-08-08 | Discharge: 2010-08-08 | Disposition: A | Discharge status: Home / Self Care | Payer: BC Managed Care – PPO | Attending: Emergency Medicine | Admitting: Emergency Medicine

## 2010-08-08 DIAGNOSIS — K589 Irritable bowel syndrome without diarrhea: Secondary | ICD-10-CM | POA: Insufficient documentation

## 2010-08-08 DIAGNOSIS — I10 Essential (primary) hypertension: Secondary | ICD-10-CM | POA: Insufficient documentation

## 2010-08-08 DIAGNOSIS — Z79899 Other long term (current) drug therapy: Secondary | ICD-10-CM | POA: Insufficient documentation

## 2010-08-08 DIAGNOSIS — Z8739 Personal history of other diseases of the musculoskeletal system and connective tissue: Secondary | ICD-10-CM | POA: Insufficient documentation

## 2010-08-08 DIAGNOSIS — IMO0001 Reserved for inherently not codable concepts without codable children: Secondary | ICD-10-CM | POA: Insufficient documentation

## 2010-08-08 DIAGNOSIS — K219 Gastro-esophageal reflux disease without esophagitis: Secondary | ICD-10-CM | POA: Insufficient documentation

## 2010-08-08 DIAGNOSIS — J329 Chronic sinusitis, unspecified: Secondary | ICD-10-CM | POA: Insufficient documentation

## 2010-08-08 NOTE — Telephone Encounter (Signed)
May be allergies Rec: Zyrtec 10mg  At bedtime   Saline nasal rinses As needed   Mucinex Twice daily  As needed   Afrin 2 puffs Twice daily  X 3 day Please contact office for sooner follow up if symptoms do not improve or worsen or seek emergency care

## 2010-08-08 NOTE — Telephone Encounter (Signed)
[email or web address]  

## 2010-08-08 NOTE — Telephone Encounter (Signed)
Called and spoke with pt and she stated that she was treated about 2 wks ago with augmentin, depo shot and prednisone---symptoms started again on wed. With headache, facial pain, pain in her teeth, eyes hurt, lots of drainage.  Please advise.

## 2010-08-08 NOTE — Telephone Encounter (Signed)
[email or web address]

## 2010-08-11 NOTE — Telephone Encounter (Signed)
Called and made pt aware of recs. Pt states she just went to the ER and states they gave her an abx and will call back if she is not getting any better  Carver Fila, CMA

## 2010-08-13 ENCOUNTER — Encounter: Payer: Self-pay | Admitting: Physician Assistant

## 2010-08-14 ENCOUNTER — Encounter: Payer: BC Managed Care – PPO | Admitting: Physician Assistant

## 2010-08-14 NOTE — Assessment & Plan Note (Signed)
Summary: 4 month follow up   Primary Care Provider:  nadel  CC:  4 month ROV & review of mult medical problems....  History of Present Illness: [name] here for a follow up visit...  she has mult medical problems including AR & Asthma;  HBP;  Hx CP & ASD repair;  Hypothyroid;  GERD/ IBS/ constipation;  hx hematuria evaluated by Thermon Leyland;  RA/ FM/ LBP follwed by DrDeveshwar;  Chronic headaches/ Migraines followed by DrFreeman;  Anxiety...   ~  January 22, 2010:  14 month ROV- she's been OOW x several yrs and unemployment ran out,  just got new job & here to re-establish> she is off all prev meds (see below) & only taking MVI, Vit D, & Dexilant samples from [place] [facility]... she was seen by ENT in [place] recently w/ throat symptoms related to reflux & Dexilant started;  also found to have TMJ problem, and c/o sinus congestion> we discussed Saline, Nasonex, Breathe-right nasal strips Qhs... non-fasting labs done today & WNL x TSH=7.79 & Synthroid 40mcg/d started...   ~  March 25, 2010:  she is feeling sl better on the Levothy75 & we will recheck her TSH today to adjust as needed;  FLP 10/11 by Cards looked good;  HR is chronically slow (40's) & she denies recurrent CP, not dizzy etc...    She saw DrKlein 9/11for CP & Bradycardia (junctional brady) in the setting of prev ASD repair- not on BP/vasoactive meds, heart rate in the 40's... she had Myoview 10/11 which was abnormal w/ mild revers defect in ant wall, mild LV dil & EF= 52%... she tells me they are trying to hold off on pacemaker.   She also saw DrNesi due to episode of gross then micro-hematuria> his UA was clear & Sonar is planned & pending at this time...    ~  July 23, 2010:  Presents c/o right ear pain, sinus congestion & drainage, no help from OTC Zyrtek;  she saw TP 2/12 w/ LER & Omep40 incr to Bid;  we discussed rx w/ Augmentin, Depo/ Dosepak, Mucinex, Saline nasal sp & Flonase (she will call her [place] ENT specailist for f/u eval)...    She saw  DrNesi 11/11 for f/u hematuria> renal sonar neg for any lesions or stones, Cysto was neg as well & they plan routine f/u to check for any recurrent blood...    She also had a f/u DrFreeman 2/12 for her Migraines> on Topamax (75mg /d) prevention & Zanaflex (4mg ) as needed;  he did mult trigger point injections which he says was very helpful to her in the past...    Current Problems:   ALLERGIC RHINITIS (ICD-477.9) - on OTC Antihist (eg- ZYRTEK) Qam; Saline nasal sp every 1-2 H during the day + MUCINEX 2Bid; & FLONASE 2spQhs... rec to use breathe right nasal strips at bedtime Prn to help w/ drainage... she knows to avoid Afrin due to BP.  ~  9/11:  she reports that recent ENT eval in [place] showed reflux related ENT symptoms & Rx w/ Dexilant> ch to OMEPRAZOLE 40mg Bid.  ASTHMA (ICD-493.90) -  uses PROAIR as needed... denies cough, sputum, hemoptysis, worsening dyspnea, wheezing, chest pains, snoring, daytime hypersomnolence, etc...   HYPERTENSION (ICD-401.9) - controlled on low salt diet alone (prev on Norvasc & avoiding meds due to her bradycardia)... denies visual changes, CP, palipit, dizziness, syncope, dyspnea, edema, etc... BP= 110/76 today & doing satis w/o medication now & she knows to avoid Afrin etc..Marland Kitchen  Hx of CHEST PAIN (ICD-786.50) Hx of ATRIAL SEPTAL DEFECT (ICD-745.5) - S/P ASD repair 1985... she has seen DrHochrein & DrKlein in the past...   ~  2DEcho 7/06 showed tr MR & TR, normal LVF...  ~  Cards f/u 3/10 w/ DrKlein for hx SA node dysfunction- doing OK/ stable rhythm/ good exerc capacity.  ~   She saw DrKlein 9/11for CP & Bradycardia (junctional brady) in the setting of prev ASD repair- not on BP/vasoactive meds, heart rate in the 40's... she tells me they are trying to hold off on pacemaker as long as poss.  ~  Myoview 10/11 was abnormal w/ mild revers defect in ant wall, mild LV dil & EF= 52%...  HYPOTHYROIDISM (ICD-244.9) - now on SYNTHROID 50mcg/d... prev on Synthroid 21mcg/d per  DrKerr, but she was out of meds for over a yr when she returned here 9/11...  ~  TSH here in 2007 = 1.32  ~  9/09: note from DrKerr reviewed- she had been off Synthroid & TSH was 13.14 w/ Synthroid restarted.  ~  labs here 9/11 off meds showed TSH= 7.79... rec> start SYNTHROID 65mcg/d.  ~  labs here 11/11 on Levo75 showed TSH=   GERD (ICD-530.81) - on OMEPRAZOLE 40mg Bid IRRITABLE BOWEL SYNDROME (ICD-564.1)   << GI - followed by DrWalker in [place]  >> CONSTIPATION (ICD-564.00)  HEMATURIA UNSPECIFIED (ICD-599.70) - she developed gross hematuria 9/11- neg culture; f/u showed microhematuria & referred to Urology;  seen by drNesi 10/11 & his UA was neg- Ultrasound planned & pending at this time.  RHEUMATOID ARTHRITIS (ICD-714.0)   <<  Rheum -  followed by DrDeveshwar  >> FIBROMYALGIA (ICD-729.1)                  prev Rx> Pred, MTX, Humira, Enbrel - off all meds since 2010 LOW BACK PAIN (ICD-724.2)  ~  she has a hx of RA and Fibromyalgia and was followed by DrDeveshwar for Rheumatology- prev on Prednisone, Plaquenil, Lyrica, Imipramine, Flexeril, Tramadol, and Vicodin...  ANXIETY (ICD-300.00) - not currently taking medication.  ANEMIA (ICD-285.9)  ~  labs 7/09 from [place] showed Hg= 12.4  ~  labs 9/11 here showed Hg= 13.0  SEBACEOUS CYST, SCALP (ICD-706.2) - this resolved w/ Keflex... she has a hx of MRSA exposure from her child and had a MRSA buttock abscess drained by Trecia Rogers 9/08...   Preventive Screening-Counseling & Management  Alcohol-Tobacco     Smoking Status: never  Caffeine-Diet-Exercise     Exercise (avg: min/session): 9:46  Allergies (verified): No Known Drug Allergies  Comments:  Nurse/Medical Assistant: The patient's medications and allergies were reviewed with the patient and were updated in the Medication and Allergy Lists.  Past History:  Past Medical History: ALLERGIC RHINITIS (ICD-477.9) ASTHMA (ICD-493.90) HYPERTENSION (ICD-401.9) CHEST PAIN (ICD-786.50) Hx  of ATRIAL SEPTAL DEFECT (ICD-745.5) SINOATRIAL NODE DYSFUNCTION (ICD-427.81) HYPOTHYROIDISM (ICD-244.9) GERD (ICD-530.81) IRRITABLE BOWEL SYNDROME (ICD-564.1) CONSTIPATION (ICD-564.00) HEMATURIA UNSPECIFIED (ICD-599.70) RHEUMATOID ARTHRITIS (ICD-714.0) FIBROMYALGIA (ICD-729.1) LOW BACK PAIN (ICD-724.2) HEADACHE, CHRONIC (ICD-784.0) ANXIETY (ICD-300.00) ANEMIA (ICD-285.9) SEBACEOUS CYST, SCALP (ICD-706.2)  Past Surgical History: S/P tubal ligation S/P ASD repair  Family History: Reviewed history from 01/22/2010 and no changes required. Positive for RA, hypertension, and diabetes  Social History: Reviewed history from 08/03/2008 and no changes required. Tobacco Use - No.  Alcohol Use - no Drug Use - no  Review of Systems      See HPI       The patient complains of hoarseness.  The patient  denies anorexia, fever, weight loss, weight gain, vision loss, decreased hearing, chest pain, syncope, dyspnea on exertion, peripheral edema, prolonged cough, headaches, hemoptysis, abdominal pain, melena, hematochezia, severe indigestion/heartburn, hematuria, incontinence, muscle weakness, suspicious skin lesions, transient blindness, difficulty walking, depression, unusual weight change, abnormal bleeding, enlarged lymph nodes, and angioedema.    Vital Signs:  Patient profile:   45 year old female Height:      68 inches Weight:      143.13 pounds BMI:     21.84 O2 Sat:      100 % on Room air Temp:     98.3 degrees F oral Pulse rate:   44 / minute BP sitting:   142 / 84  (left arm) Cuff size:   regular  Vitals Entered By: Randell Loop CMA (July 23, 2010 11:23 AM)  O2 Sat at Rest %:  100 O2 Flow:  Room air CC: 4 month ROV & review of mult medical problems... Is Patient Diabetic? No Pain Assessment Patient in pain? yes      Onset of pain  right ear pain Comments meds updated today with pt   Physical Exam  Additional Exam:  WD, Thin, 44 y/o BF in NAD... GENERAL:  Alert &  oriented; pleasant & cooperative... HEENT:  [place]/AT, EOM-wnl, PERRLA, EACs-clear, TMs-wnl, NOSE- congested, clear discharge; THROAT-clear & wnl. NECK: supple w/ mild decrROM; no JVD; normal carotid impulses w/o bruits; no thyromegaly or nodules palpated; no lymphadenopathy. CHEST:  Clear to P & A; without wheezes/ rales/ or rhonchi. HEART:  Reg bradycardia;  gr 1-2 SEM without rubs or gallops... ABDOMEN:  Soft & nontender; normal bowel sounds; no organomegaly or masses detected. EXT: without deformities, mild arthritic changes; no varicose veins/ venous insuffic/ or edema. NEURO:  CNs intact; no focal neuro deficits... DERM:  No lesions noted; no rash etc...    Impression & Recommendations:  Problem # 1:  SINUSITIS (ICD-473.9) We discussed Rx for URI, Sinus symptoms, ear symptoms w/ Augmentin, Depo/ Dosepak, Mucinex, Saline, etc... She will contact her ENT spec in [place] for f/u eval... The following medications were removed from the medication list:    Flonase 50 Mcg/act Susp (Fluticasone propionate) .Marland Kitchen... 2 sprays each nostril two times a day Her updated medication list for this problem includes:    Ocean Nasal Spray 0.65 % Soln (Saline) .Marland Kitchen... 1-2 sprays each nostril as needed    Mucinex D 60-600 Mg Xr12h-tab (Pseudoephedrine-guaifenesin) .Marland Kitchen... Take 1-2 tablets every 12 hours as needed    Augmentin 875-125 Mg Tabs (Amoxicillin-pot clavulanate) .Marland Kitchen... Take 1 tab by mouth two times a day...  Orders: Depo- Medrol 80mg  (J1040) Admin of Therapeutic Inj  intramuscular or subcutaneous (16109)  Problem # 2:  ASTHMA (ICD-493.90) Stable w/o exac... uses Proair Prn... Her updated medication list for this problem includes:    Proair Hfa 108 (90 Base) Mcg/act Aers (Albuterol sulfate) .Marland Kitchen... 1-2 inhalations qid as needed for wheezing...    Prednisone (pak) 5 Mg Tabs (Prednisone) .Marland Kitchen... Take as directed...  Problem # 3:  HYPERTENSION (ICD-401.9) BP is stable on low salt diet & she is  reminded...  Problem # 4:  Hx of ATRIAL SEPTAL DEFECT (ICD-745.5) Followed by DrKlein for Cards>  stable w/o CP, palpit, dizzy/ lightheaded/ syncope, edema, etc...  Problem # 5:  HYPOTHYROIDISM (ICD-244.9) Stable on Synthoid 75 daily... Her updated medication list for this problem includes:    Levothyroxine Sodium 75 Mcg Tabs (Levothyroxine sodium) .Marland Kitchen... Take 1 tab by mouth once daily...  Problem #  6:  GI >>> GI is stable...  Problem # 7:  HEMATURIA UNSPECIFIED (ICD-599.70) GU is stable w/ neg work up per [employer]... Her updated medication list for this problem includes:    Augmentin 875-125 Mg Tabs (Amoxicillin-pot clavulanate) .Marland Kitchen... Take 1 tab by mouth two times a day...  Problem # 8:  OTHER MEDICAL PROBLEMS AS NOTED>>>  Complete Medication List: 1)  Zyrtec Allergy 10 Mg Tabs (Cetirizine hcl) .... Take 1 tablet by mouth once a day 2)  Ocean Nasal Spray 0.65 % Soln (Saline) .Marland Kitchen.. 1-2 sprays each nostril as needed 3)  Mucinex D 60-600 Mg Xr12h-tab (Pseudoephedrine-guaifenesin) .... Take 1-2 tablets every 12 hours as needed 4)  Proair Hfa 108 (90 Base) Mcg/act Aers (Albuterol sulfate) .Marland Kitchen.. 1-2 inhalations qid as needed for wheezing... 5)  Omeprazole 40 Mg Cpdr (Omeprazole) .Marland Kitchen.. 1 tab by mouth two times a day 6)  Levothyroxine Sodium 75 Mcg Tabs (Levothyroxine sodium) .... Take 1 tab by mouth once daily.Marland KitchenMarland Kitchen 7)  Topiramate 25 Mg Tabs (Topiramate) .... 3 tabs by mouth once daily 8)  Tizanidine Hcl 4 Mg Tabs (Tizanidine hcl) .Marland Kitchen.. 1 tab by mouth every 8 hours as needed - do not exceed 3 doses in a 24hour period 9)  Centrum Tabs (Multiple vitamins-minerals) .... Take 1 tablet by mouth once a day 10)  Vitamin D 1000 Unit Tabs (Cholecalciferol) .... Take one tablet by mouth once daily 11)  Eq Ibuprofen 200 Mg Tabs (Ibuprofen) .... Per bottle 12)  Augmentin 875-125 Mg Tabs (Amoxicillin-pot clavulanate) .... Take 1 tab by mouth two times a day... 13)  Prednisone (pak) 5 Mg Tabs (Prednisone) ....  Take as directed...  Other Orders: Tdap => 60yrs IM (16109) Admin 1st Vaccine (60454)  Patient Instructions: 1)  Today we updated your med list- see below.... 2)  We wrote new perscription for Aumentin antibiotic (take twice daily til gone) and for a PredDosepak (take as directed)... 3)  We gave you a Depo shot, and the TDAP combination tetanus vaccine today (it is good for 81yrs)... 4)  Please check back w/ your ENT/ sinus doctor in [place] for any recurrent symptoms.Marland KitchenMarland Kitchen 5)  please ret to our lab in the AM for your FASTING blood work.. please call the "phone tree" in a few days for your lab results.Marland KitchenMarland Kitchen  6)  Please schedule a follow-up appointment in 4 months. Prescriptions: PREDNISONE (PAK) 5 MG TABS (PREDNISONE) take as directed...  #5mg -6d pack x 0   Entered and Authorized by:   Michele Mcalpine MD   Signed by:   Michele Mcalpine MD on 07/23/2010   Method used:   Print then Give to Patient   RxID:   [id number] AUGMENTIN 875-125 MG TABS (AMOXICILLIN-POT CLAVULANATE) take 1 tab by mouth two times a day...  #14 x 0   Entered and Authorized by:   Michele Mcalpine MD   Signed by:   Michele Mcalpine MD on 07/23/2010   Method used:   Print then Give to Patient   RxID:   [id number]    Immunizations Administered:  Tetanus Vaccine:    Vaccine Type: Tdap    Site: left deltoid    Mfr: GlaxoSmithKline    Dose: 0.5 ml    Route: IM    Given by: Randell Loop CMA    Exp. Date: 03/06/2012    Lot #: [id number]    VIS given: 04/04/08 version given July 23, 2010.    Medication Administration  Injection # 1:  Medication: Depo- Medrol 80mg     Diagnosis: SINUSITIS (ICD-473.9)    Route: IM    Site: LUOQ gluteus    Exp Date: 11/2012    Lot #: [id number]    Mfr: [employer]    Patient tolerated injection without complications    Given by: Randell Loop CMA (July 23, 2010 12:59 PM)  Orders Added: 1)  Est. Patient Level IV [16109] 2)  Depo- Medrol 80mg  [J1040] 3)  Admin of Therapeutic Inj   intramuscular or subcutaneous [96372] 4)  Tdap => 28yrs IM [90715] 5)  Admin 1st Vaccine [60454]

## 2010-08-26 ENCOUNTER — Ambulatory Visit (INDEPENDENT_AMBULATORY_CARE_PROVIDER_SITE_OTHER): Payer: BC Managed Care – PPO | Admitting: Physician Assistant

## 2010-08-26 ENCOUNTER — Encounter: Payer: Self-pay | Admitting: Physician Assistant

## 2010-08-26 VITALS — BP 106/72 | HR 42 | Resp 18 | Ht 68.0 in | Wt 138.0 lb

## 2010-08-26 DIAGNOSIS — R079 Chest pain, unspecified: Secondary | ICD-10-CM

## 2010-08-26 DIAGNOSIS — Q2111 Secundum atrial septal defect: Secondary | ICD-10-CM

## 2010-08-26 DIAGNOSIS — Q211 Atrial septal defect: Secondary | ICD-10-CM

## 2010-08-26 DIAGNOSIS — I495 Sick sinus syndrome: Secondary | ICD-10-CM

## 2010-08-26 NOTE — Progress Notes (Signed)
History of Present Illness: Primary Electrophysiologist:  Dr. Sherryl Manges  Brittany Hodges is a 45 y.o. female with a h/o junctional rhythm, past ASD repair, HTN, Asthma, hypothyroidism, GERD, anemia, fibromyalgia and rheumatoid arthritis.  She was recently seen by Dr. Graciela Husbands for chest pain.  She had had a prior Myoview study in the fall of 2011 that demonstrated an EF of 52% and mild ischemia versus attenuation artifact.  It was decided to proceed with cardiac catheterization.  This was performed 07/31/10 and demonstrated no CAD and normal ejection fraction.  With her history of ASD repair, she was set up for an echocardiogram.  This was done March 22 and demonstrated an EF of 55-60%, trace aortic insufficiency, mild mitral regurgitation, atrial septal aneurysm and no evidence of residual ASD.  She returns for post cath follow up.  She is doing well.  She continues to have episodes of chest discomfort.  These are not related to exertion.  They're left-sided and involve her left shoulder.  It is a fairly quick type of discomfort.  It is not related to meals.  She is already taking a proton pump inhibitor twice a day.  She plans to follow up with her rheumatologist soon.  She denies orthopnea, PND or pedal edema.  She denies syncope or near-syncope.  She continues to work out 2 to 3 times a week without limitation.  Past Medical History  Diagnosis Date  . Allergic rhinitis, cause unspecified   . Asthma   . Chest pain, unspecified     cardiac cath 07/31/10: normal coronaries; vigorous LVF  . Atrial septal defect     s/p repair;   Echo 08/07/10: EF 55-60%; mild MR; atrial septal aneurysm; no residual ASD  . Sinoatrial node dysfunction     eval. for chronotropic competence completed in past  . Hypothyroidism   . Esophageal reflux   . Irritable bowel syndrome   . Hematuria, unspecified   . Rheumatoid arthritis   . Fibromyalgia   . Anxiety   . Anemia   . Sebaceous cyst     Current Outpatient  Prescriptions  Medication Sig Dispense Refill  . albuterol (PROAIR HFA) 108 (90 BASE) MCG/ACT inhaler Inhale 2 puffs into the lungs every 6 (six) hours as needed.        Marland Kitchen levothyroxine (SYNTHROID, LEVOTHROID) 75 MCG tablet       . Multiple Vitamins-Minerals (CENTRUM PO) 1 tab po qd       . topiramate (TOPAMAX) 25 MG tablet Take 3 tablets at bed time      . DISCONTD: amoxicillin-clavulanate (AUGMENTIN) 875-125 MG per tablet       . DISCONTD: predniSONE, Pak, (STERAPRED) 5 MG TABS       . DISCONTD: tiZANidine (ZANAFLEX) 4 MG capsule Take 4 mg by mouth 3 (three) times daily.          No Known Allergies  Vital Signs: BP 106/72  Pulse 42  Resp 18  Ht 5\' 8"  (1.727 m)  Wt 138 lb (62.596 kg)  BMI 20.98 kg/m2  PHYSICAL EXAM: Well nourished, well developed, in no acute distress HEENT: normal Neck: no JVD Cardiac:  normal S1, S2; RRR; no murmur Lungs:  clear to auscultation bilaterally, no wheezing, rhonchi or rales Abd: soft, nontender, no hepatomegaly Ext: no edema; RFA site without hematoma or bruit Skin: warm and dry Neuro:  CNs 2-12 intact, no focal abnormalities noted  EKG:  Sinus bradycardia with competing junctional rhythm, heart rate 40, no significant changes  previous tracing  ASSESSMENT AND PLAN:

## 2010-08-26 NOTE — Assessment & Plan Note (Signed)
Normal coronary arteries by cardiac catheterization.  Her chest pain is atypical and noncardiac.  It does not seem to be related to her acid reflux disease.  I have recommended that she follow up with her rheumatologist as this may be related to her fibromyalgia.

## 2010-08-26 NOTE — Assessment & Plan Note (Signed)
We discussed her recent 2-D echocardiogram.  Her ASD repair is stable.

## 2010-08-26 NOTE — Assessment & Plan Note (Signed)
Stable.  She knows to contact us if she becomes symptomatic.  Follow up with Dr. Graciela Husbands in 3 months.

## 2010-08-26 NOTE — Patient Instructions (Signed)
Your physician wants you to follow-up in: 3 months with DR. KLEIN. You will receive a reminder letter in the mail two months in advance. If you don't receive a letter, please call our office to schedule the follow-up appointment.

## 2010-08-28 NOTE — Procedures (Signed)
  NAME:  Brittany Hodges, Brittany Hodges NO.:  [id number]  MEDICAL RECORD NO.:  [id number]          PATIENT TYPE:  LOCATION:                                 FACILITY:  PHYSICIAN:  Arturo Morton. Riley Kill, MD, [facility] DATE OF BIRTH:  DATE OF PROCEDURE: DATE OF DISCHARGE:                           CARDIAC CATHETERIZATION   INDICATIONS:  This very nice lady has had an atrial septal defect repair.  She has had a junctional rhythm.  She has had an assessment for chronotropic competence which have been adequate.  She has had a little bit more exertional shortness of breath and fatigue.  Hypothyroid was treated.  Myoview scanning suggested mild ischemia versus attenuation with 52% with minimal LV dilatation.  She, however, continues to have some chest discomfort.  She was seen by Dr. Graciela Husbands and set up for left heart catheterization.  PROCEDURES: 1. Left heart catheterization. 2. Selective coronary arteriography. 3. Selective left ventriculography.  DESCRIPTION OF PROCEDURE:  The patient was brought to the cath lab, prepped and draped in usual fashion.  A 4-French catheters were utilized using left and right coronaries obtained in multiple angiographic projections from the right femoral approach.  There were no major complications.  Sheath was removed in the lab and she was taken to the holding area in satisfactory clinical condition.  HEMODYNAMIC DATA: 1. Central aortic pressure 130/72, mean 94. 2. LV pressure 123/13. 3. No gradient pullback across aortic valve.  ANGIOGRAPHIC DATA: 1. Ventriculography done in the RAO projection reveals vigorous global     systolic function.  No segmental wall motion abnormalities are     identified. 2. The right coronary artery is smooth and provides posterior     descending posterolateral system. 3. The left main is free of critical disease. 4. The left anterior descending artery courses to the apex and     provides 2 tiny marginal branches, both  of which appeared to be     free of disease. 5. The circumflex provides a small first marginal, moderate second     marginal, 2 more posterolaterals, and large posterolateral branch.     The second marginal may have some very minor plaquing at the     ostium, best seen in the LAO views.  This is not felt to be     significant.  The distal vessels are large and free of critical     disease.  CONCLUSIONS: 1. Well-preserved left ventricular function. 2. No significant high-grade focal coronary obstruction.  DISPOSITION:  At the present time, she is stable.  She does not have high-grade coronary artery disease.  I would recommend follow up with Dr. Graciela Husbands, and subsequently 2D echo and possibly the CT would be suggested with a history of prior ASD repair.     Arturo Morton. Riley Kill, MD, [facility]     TDS/MEDQ  D:  07/31/2010  T:  08/01/2010  Job:  [id number]  cc:   Duke Salvia, MD, [facility]  Electronically Signed by Shawnie Pons MD [facility] on 08/28/2010 05:38:39 AM

## 2010-09-01 ENCOUNTER — Emergency Department (HOSPITAL_BASED_OUTPATIENT_CLINIC_OR_DEPARTMENT_OTHER)
Admission: EM | Admit: 2010-09-01 | Discharge: 2010-09-01 | Disposition: A | Discharge status: Home / Self Care | Payer: BC Managed Care – PPO | Attending: Emergency Medicine | Admitting: Emergency Medicine

## 2010-09-01 DIAGNOSIS — Z8739 Personal history of other diseases of the musculoskeletal system and connective tissue: Secondary | ICD-10-CM | POA: Insufficient documentation

## 2010-09-01 DIAGNOSIS — IMO0001 Reserved for inherently not codable concepts without codable children: Secondary | ICD-10-CM | POA: Insufficient documentation

## 2010-09-01 DIAGNOSIS — I10 Essential (primary) hypertension: Secondary | ICD-10-CM | POA: Insufficient documentation

## 2010-09-01 DIAGNOSIS — K589 Irritable bowel syndrome without diarrhea: Secondary | ICD-10-CM | POA: Insufficient documentation

## 2010-09-01 DIAGNOSIS — Z79899 Other long term (current) drug therapy: Secondary | ICD-10-CM | POA: Insufficient documentation

## 2010-09-01 DIAGNOSIS — K219 Gastro-esophageal reflux disease without esophagitis: Secondary | ICD-10-CM | POA: Insufficient documentation

## 2010-09-01 DIAGNOSIS — Z9109 Other allergy status, other than to drugs and biological substances: Secondary | ICD-10-CM | POA: Insufficient documentation

## 2010-09-01 DIAGNOSIS — E039 Hypothyroidism, unspecified: Secondary | ICD-10-CM | POA: Insufficient documentation

## 2010-09-01 DIAGNOSIS — J309 Allergic rhinitis, unspecified: Secondary | ICD-10-CM | POA: Insufficient documentation

## 2010-09-01 DIAGNOSIS — R51 Headache: Secondary | ICD-10-CM | POA: Insufficient documentation

## 2010-09-30 NOTE — Assessment & Plan Note (Signed)
[facility]                             PULMONARY OFFICE NOTE   Brittany Hodges, Brittany Hodges                    MRN:          [id number]  DATE:02/03/2007                            DOB:          29-Nov-1965    HISTORY OF PRESENT ILLNESS:  The patient is 45 year old African-American  female, a patient of Dr. Jodelle Green, who has a known history of asthma,  allergic rhinitis, hypertension and hyperthyroidism, returns today for  followup.  The patient was seen 1 week ago for an acute right otitis  media and rhinitis flare.  She was placed on a 7-day course of  amoxicillin and Cipro Otic Drops.  Symptoms have much improved.  She  does have some postnasal drip symptoms that have been increased over the  last couple of days.  She denies any fever, chest pain, shortness of  breath.  Patient does complain that she has over the last week developed  a sore along the left buttocks region that has started draining.  Patient complains it feels as if she has a boil or cyst back there and  has been using warm compresses.  Patient noticed some drainage about 3  days ago.  She denies any fever, recent travel or hot tub use.   PAST MEDICAL HISTORY:  1. Asthma.  2. Hypertension.  3. Allergic rhinitis.  4. Gastroesophageal reflux.  5. Irritable bowel syndrome.  6. Constipation.  7. History of junctional bradycardia with previous atriotomy surgery      for a possible atrial septal defect repair in 1985.  There was no      atrial septal defect seen.  Followed by Dr. Sherryl Manges at [facility]      Cardiology.  8. Hypothyroidism.  9. Low back pain.  10.Fibromyalgia.  11.Rheumatoid arthritis.   CURRENT MEDICATIONS:  1. Imipramine 25 mg q.h.s.  2. Allegra 180 daily.  3. Prevacid 30 mg daily.  4. Cyclobenzaprine 10 mg q.h.s.  5. Reglan 10 mg t.i.d.  6. Norvasc 5 mg daily.  7. Nasonex 2 puffs q.h.s.  8. Benefiber daily.  9. Multivitamin daily.  10.Synthroid 112 mcg daily.  11.Plaquenil t.i.d.  12.Lyrica 50 mg t.i.d.  13.Folic acid 1 mg daily.  14.Methotrexate 25 mg three weekly.  15.Imitrex p.r.n.  16.Tramadol p.r.n.  17.Hycosamine p.r.n.  18.Albuterol p.r.n.   PHYSICAL EXAM:  Patient is a very pleasant female in no acute distress,  she is afebrile with stable vital signs.  HEENT:  Bilateral TMs are normal.  EACs are clear.  NECK:  Supple without adenopathy, no JVD.  LUNG SOUNDS:  Clear.  CARDIAC:  Regular rate.  ABDOMEN:  Soft, nontender, no palpable hepatosplenomegaly.  EXTREMITIES:  Warm without any calf cyanosis, clubbing, edema.  SKIN:  A small cystic lesion along the mid left buttocks.  Measuring  approximately 0.5 cm.  Area is draining some serous fluid.  No  significant erythema is noted.  Area is tender to touch.   IMPRESSION AND PLAN:  1. Left buttocks cyst versus carbuncle. Wound culture is pending      today.  Patient has been referred over to [facility]      for possible excision and drainage.  Patient is to continue warm      compresses.  Will hold on additional antibiotics at this time and      let general surgeon evaluate.  Also, wound culture results are      pending and will follow up accordingly.  2. Recent acute right otitis media.  Patient is recommended to finish      amoxicillin and Cipro for a total of 7 days.  Patient will return      back with Dr. Kriste Basque in 1 month or sooner if needed.      Brittany Oaks, NP  Electronically Signed      Lonzo Cloud. Kriste Basque, MD  Electronically Signed   TP/MedQ  DD: 02/03/2007  DT: 02/03/2007  Job #: [id number]

## 2010-09-30 NOTE — Letter (Signed)
January 11, 2008    Lonzo Cloud. Kriste Basque, MD  [address]  [place], [place] [address]   RE:  Brittany Hodges, Brittany Hodges  MRN:  [id number]  /  DOB:  May 01, 1966   Dear Lorin Picket,   Ms. Monteith was seen today at the request of the [facility]  Emergency Room because of problems with her slow heart rate and apparent  changes in electrocardiogram.  Unfortunately, she had none of those  tracings.   Her energy level has been quite stable, maybe with some recent decrease.  She recalls she has a longstanding history of junctional rhythm with  good heart rate response to exercise.   Her hospitalizations at [facility] were related to chest pain syndrome,  which was defined as pleurisy.   Her past history is notable as you know for some hypertension and atrial  septal defect.   Medications are unchanged.   PHYSICAL EXAMINATION:  VITAL SIGNS:  Her blood pressure is 122/82.  Her  pulse was 48.  Her weight was 135, which is up 2 pounds.  GENERAL:  She is in no acute distress.  LUNGS:  Clear.  HEART:  Sounds were regular with an early systolic murmur and a loud S1.  ABDOMEN:  Soft.  EXTREMITIES:  Without edema.   Electrocardiogram dated today demonstrated sinus rhythm of 48 with  isoarrhythmic junctional rhythm at a QRS duration of 90 and a QT  interval 430.  The axis was 68 degrees.   IMPRESSION:  1. Junctional rhythm.  2. Abnormalities noted on the electrocardiograms at [facility] and these were to be requested.  3. Chest pain syndrome.  4. History of an atrial septal defect.  Though, I think atrial septal      defect was repaired without evidence of atrial septal defect by      echo in 2006.   Ms. Camera has this issue that was raised by the [facility] Emergency  Room.  Unfortunately, we do not have those data.  We will get those, and  I have told her I would call her about that.  I am not sure what is  going to show, though I suspect it will probably show sinus rhythm on  one and junctional rhythm on the other.   Please let me know if there is anything that I can do further to help.     Sincerely,      Duke Salvia, MD, [facility]  Electronically Signed    SCK/MedQ  DD: 01/11/2008  DT: 01/12/2008  Job #: [id number]

## 2010-09-30 NOTE — Assessment & Plan Note (Signed)
[facility]                             PULMONARY OFFICE NOTE   Brittany Hodges, Brittany Hodges                    MRN:          [id number]  DATE:01/28/2007                            DOB:          1966/03/11    HISTORY OF PRESENT ILLNESS:  The patient is a 45 year old African  American female, a patient of Dr. Kriste Basque, who has a known history of  asthma, allergic rhinitis, hypertension, and hypothyroidism presents  today for a 3-day history of right ear pain.  The patient complains of  some fullness along the right ear with tenderness to touch.  The patient  denies any chest pain, shortness of breath, purulent sputum, fever,  nausea, vomiting, recent travel, antibiotic use.  The patient does  complain of some nasal stuffiness and post nasal drip symptoms.   PAST MEDICAL HISTORY:  Reviewed.   CURRENT MEDICATIONS:  Reviewed.   PHYSICAL EXAMINATION:  GENERAL:  The patient is a pleasant female in no  acute distress.  VITAL SIGNS:  Temperature is 99.8, blood pressure 104/62, O2 saturation  is 99% on room air.  HEENT:  Nasal mucosa is slightly pale.  Nontender sinuses.  Conjunctivae  not injected.  Posterior pharynx is clear.  Left TM and EAC are clear.  The right TM with some mild erythema and bulging.  NECK:  Supple with positive anterior cervical adenopathy.  No JVD.  RESPIRATORY:  Lung sounds are clear.  CARDIAC:  Regular rate.  ABDOMEN:  Soft and nontender.  EXTREMITIES:  Warm without any edema.   IMPRESSION:  1. Acute right otitis media.  2. Rhinitis flare.   PLAN:  1. The patient is to being amoxicillin 500 mg b.i.d. x7 days with      food.  2. Cipro-HC otic drops 3 drops to right ear b.i.d. x7 days.  3. The patient is to follow up with Dr. Kriste Basque as scheduled or sooner      if needed.     Rubye Oaks, NP  Electronically Signed      Lonzo Cloud. Kriste Basque, MD  Electronically Signed   TP/MedQ  DD: 01/28/2007  DT: 01/28/2007  Job #: [id number]

## 2010-09-30 NOTE — Assessment & Plan Note (Signed)
[facility]                         ELECTROPHYSIOLOGY OFFICE NOTE   CHARINA, FONS                    MRN:          [id number]  DATE:07/15/2007                            DOB:          Jan 04, 1966    Ms. Wieczorek comes in from followup for her junctional rhythm.  She  continues to have no problems of exercise tolerance.   She has had some problems of intermittent chest pains.  She describes  these as being midsternal, going into her left shoulder; they typically  last only seconds, though they are unrelated to exertion.   MEDICATIONS:  Reviewed and are unchanged.   They include methotrexate and imipramine.   EXAMINATION:  Her blood pressure today was 116/72 with a pulse of 48.  LUNGS:  Clear.  The heart sounds were regular with a 2/6 murmur.  EXTREMITIES:  Without edema.   Review her echo demonstrated mitral regurgitation.   IMPRESSION:  1. Junctional rhythm with adequate exercise tolerance.  2. Systolic murmur, probably her mitral regurgitation.  3. Atypical chest pains.   Ms. Markoff is stable.  We will plan to see her again at Dr. Jodelle Green  request.     Duke Salvia, MD, [facility]  Electronically Signed    SCK/MedQ  DD: 07/15/2007  DT: 07/16/2007  Job #: [id number]

## 2010-10-03 NOTE — Assessment & Plan Note (Signed)
[facility]                                 ON-CALL NOTE   VALENA, IVANOV                    MRN:          [id number]  DATE:09/09/2006                            DOB:          March 02, 1966    TIME OF CALL:  6:30.   PRIMARY CARE PHYSICIAN:  Alroy Dust, M.D.   PULMONARY/ALLERGY PHYSICIAN:  Jetty Duhamel, M.D.   EP CARDIOLOGIST:  Lewayne Bunting, M.D.   HISTORY OF PRESENT ILLNESS:  Ms. Cyriah called and states that she  has been having chest discomfort, that has lasted since early this  morning. She describes it as sharp, kind of tingling sensations  throughout her chest, and underneath her left arm pit. She says that she  has been moving boxes and has noted this discomfort all day. She has  also noticed a lot of belching. She was concerned that she could be  having a heart attack. I explained to her that I could not tell if she  was having a heart attack over the phone. And that if she wished  evaluation, she could proceed to the nearest emergency room and the  emergency room physician could begin evaluation and contact us if she  was having any cardiac issues.   While looking up her information in the computer, I continued to ask her  questions. She denies any history of hypertension, hyperlipidemia,  diabetes, prior myocardial infarction, CVA. She states that she sees Dr.  Graciela Husbands for bradycardia. Through E-chart, she has had a stress test that  was unremarkable and showed adequate chronotropic response in February  2007. I explained to Ms. Cannady that the likelihood that this was a  cardiac related chest discomfort, was small. However, again, I could not  tell her specifically that this was not related to her heart adn if she  wished to be evaluated, she should come to the nearest emergency room. I  explained to her that the emergency room physician could evaluate her  and refer her to [place] if necessary. Ms. Inlow further elaborated that  she  thinks it is related to muscles and she will rest. If it gets any  worse, she will come to the emergency room for evaluation. Otherwise,  she will call Dr. Kriste Basque in the morning.      Joellyn Rued, PA-C  Electronically Signed      Doylene Canning. Ladona Ridgel, MD  Electronically Signed   EW/MedQ  DD: 09/09/2006  DT: 09/10/2006  Job #: [id number]   cc:   Lonzo Cloud. Kriste Basque, MD  Clinton D. Maple Hudson, MD, FCCP, Maurine Simmering. Ladona Ridgel, MD

## 2010-10-03 NOTE — Assessment & Plan Note (Signed)
[facility]                         ELECTROPHYSIOLOGY OFFICE NOTE   Brittany Hodges, Brittany Hodges                    MRN:          [id number]  DATE:06/22/2006                            DOB:          1966/01/20    [name] is junctional rhythm.  She is having more fatigue.  __________ in the year.  Last year, she had been diagnosed rheumatoid  arthritis and for which she is taking therapy.   She does have a fair amount of fatigue.   We will plan to treadmill with her again today to make sure he maintains  sinus node responsiveness to exercise.     Duke Salvia, MD, [facility]  Electronically Signed    SCK/MedQ  DD: 06/22/2006  DT: 06/22/2006  Job #: [id number]

## 2010-10-23 ENCOUNTER — Encounter: Payer: Self-pay | Admitting: *Deleted

## 2010-11-28 ENCOUNTER — Ambulatory Visit: Payer: BC Managed Care – PPO | Admitting: Pulmonary Disease

## 2010-12-09 ENCOUNTER — Ambulatory Visit (INDEPENDENT_AMBULATORY_CARE_PROVIDER_SITE_OTHER): Payer: BC Managed Care – PPO | Admitting: Pulmonary Disease

## 2010-12-09 ENCOUNTER — Encounter: Payer: Self-pay | Admitting: Pulmonary Disease

## 2010-12-09 DIAGNOSIS — K589 Irritable bowel syndrome without diarrhea: Secondary | ICD-10-CM

## 2010-12-09 DIAGNOSIS — M545 Low back pain, unspecified: Secondary | ICD-10-CM

## 2010-12-09 DIAGNOSIS — Q2111 Secundum atrial septal defect: Secondary | ICD-10-CM

## 2010-12-09 DIAGNOSIS — IMO0001 Reserved for inherently not codable concepts without codable children: Secondary | ICD-10-CM

## 2010-12-09 DIAGNOSIS — J45909 Unspecified asthma, uncomplicated: Secondary | ICD-10-CM

## 2010-12-09 DIAGNOSIS — I495 Sick sinus syndrome: Secondary | ICD-10-CM

## 2010-12-09 DIAGNOSIS — F411 Generalized anxiety disorder: Secondary | ICD-10-CM

## 2010-12-09 DIAGNOSIS — K219 Gastro-esophageal reflux disease without esophagitis: Secondary | ICD-10-CM

## 2010-12-09 DIAGNOSIS — M069 Rheumatoid arthritis, unspecified: Secondary | ICD-10-CM

## 2010-12-09 DIAGNOSIS — E039 Hypothyroidism, unspecified: Secondary | ICD-10-CM

## 2010-12-09 DIAGNOSIS — R079 Chest pain, unspecified: Secondary | ICD-10-CM

## 2010-12-09 DIAGNOSIS — I10 Essential (primary) hypertension: Secondary | ICD-10-CM

## 2010-12-09 DIAGNOSIS — J309 Allergic rhinitis, unspecified: Secondary | ICD-10-CM

## 2010-12-09 DIAGNOSIS — Q211 Atrial septal defect: Secondary | ICD-10-CM

## 2010-12-09 DIAGNOSIS — R319 Hematuria, unspecified: Secondary | ICD-10-CM

## 2010-12-09 NOTE — Patient Instructions (Signed)
Today we updated your med list in EPIC...    Continue your present meds the same...  Call for any problems...  Let's plan a follow up visit in about 4 months w/ follow up CXR & FASTING labs at that time.Marland KitchenMarland Kitchen

## 2010-12-09 NOTE — Progress Notes (Signed)
Subjective:    Patient ID: Brittany Hodges, female    DOB: 11/30/1965, 45 y.o.   MRN: [id number]  HPI [name] here for a follow up visit...  she has mult medical problems including AR & Asthma;  HBP;  Hx CP & ASD repair;  Hypothyroid;  GERD/ IBS/ constipation;  hx hematuria evaluated by Thermon Leyland;  RA/ FM/ LBP follwed by DrDeveshwar;  Chronic headaches/ Migraines followed by DrFreeman;  Anxiety...  ~  January 22, 2010:  14 month ROV- she's been OOW x several yrs and unemployment ran out,  just got new job & here to re-establish> she is off all prev meds (see below) & only taking MVI, Vit D, & Dexilant samples from [place] [facility]... she was seen by ENT in [place] recently w/ throat symptoms related to reflux & Dexilant started;  also found to have TMJ problem, and c/o sinus congestion> we discussed Saline, Nasonex, Breathe-right nasal strips Qhs... non-fasting labs done today & WNL x TSH=7.79 & Synthroid 64mcg/d started...  ~  March 25, 2010:  she is feeling sl better on the Levothy75 & we will recheck her TSH today (4.67) to adjust as needed;  FLP 10/11 by Cards looked good;  HR is chronically slow (40's) & she denies recurrent CP, not dizzy etc...    She saw DrKlein 9/11for CP & Bradycardia (junctional brady) in the setting of prev ASD repair- not on BP/vasoactive meds, heart rate in the 40's... she had Myoview 10/11 which was abnormal w/ mild revers defect in ant wall, mild LV dil & EF= 52%... she tells me they are trying to hold off on pacemaker.   She also saw DrNesi due to episode of gross then micro-hematuria> his UA was clear & Sonar is planned & pending at this time...   ~  July 23, 2010:  Presents c/o right ear pain, sinus congestion & drainage, no help from OTC Zyrtek;  she saw TP 2/12 w/ LER & Omep40 incr to Bid;  we discussed rx w/ Augmentin, Depo/ Dosepak, Mucinex, Saline nasal sp & Flonase (all symptoms resolved on meds)...    She saw DrNesi 11/11 for f/u hematuria> renal sonar neg for any  lesions or stones, Cysto was neg as well & they plan routine f/u to check for any recurrent blood...    She also had a f/u DrFreeman 2/12 for her Migraines> on Topamax (75mg /d) prevention & Zanaflex (4mg ) as needed;  he did mult trigger point injections which he says was very helpful to her in the past (& they helped again)...  ~  July 24,2012:  Brittany Hodges had persist CP 3/12 & DrKlein ordered a Cath> see below: cath neg w/ norm LVF & no signif CAD; she has been doing well, exercising daily w/o difficulty, denies palpit/ dizzy/ syncope/ etc... She also had f/u DrNesi last week & reports that her urine was clear, no blood...  We reviewed her interim cardiac visits, tests, & labs...  Her CC= some diffuse arhtritic pain & she uses OTC Ibuprofen Prn...   Current Problems:   ALLERGIC RHINITIS (ICD-477.9) - on OTC Antihist (eg- ZYRTEK) Qam; Saline nasal sp every 1-2 H during the day + MUCINEX 2Bid; & FLONASE 2spQhs... rec to use breathe right nasal strips at bedtime Prn to help w/ drainage... she knows to avoid Afrin due to BP. ~  9/11:  she reports that recent ENT eval in [place] showed reflux related ENT symptoms & Rx w/ Dexilant> ch to OMEPRAZOLE 40mg Bid.  ASTHMA (ICD-493.90) -  uses PROAIR as needed... denies cough, sputum, hemoptysis, worsening dyspnea, wheezing, chest pains, snoring, daytime hypersomnolence, etc... On the OMEP Bid & antireflux regimen to help nocturnal symptoms... ~  CXR 8/09 showed prev median sternotomy, heart norm size, lungs clear, +scoliosis...  HYPERTENSION (ICD-401.9) - controlled on low salt diet alone (prev on Norvasc & avoiding meds due to her bradycardia)... denies visual changes, CP, palipit, dizziness, syncope, dyspnea, edema, etc... BP= 118/72 today & doing satis w/o medication now...  Hx of CHEST PAIN (ICD-786.50) >> most of it is AtypCWP related to her FM... Hx of ATRIAL SEPTAL DEFECT (ICD-745.5) - S/P ASD repair 1985> She is followed by DrStuckey & DrKlein... ~  EKG  shows junctional bradycardia & NSSTTWA... ~  2DEcho 7/06 showed tr MR & TR, normal LVF... ~  Followed by DrKlein for hx SA node dysfunction/ junctional rhythm in the setting of prev ASD repair- doing OK/ stable rhythm/ good exerc capacity. ~  She saw DrKlein 9/11 for CP & Bradycardia (junctional brady)- not on BP/vasoactive meds, heart rate in the 40's> she tells me they are trying to hold off on pacemaker as long as poss. ~  Myoview 10/11 was abnormal w/ mild revers defect in ant wall, mild LV dil & EF= 52%... ~  persistant vs recurrent CP 3/12 lead to Cath by DrStuckey> norm coronaries w/ min luminal irregularities, well preserved LVF... ~  2DEcho 4/12 showed norm LV size & funct w/ EF=55-60%, no regional wall motion abn, mildMR, trivAI, +atrial septal aneurysm  HYPOTHYROIDISM (ICD-244.9) - on SYNTHROID 64mcg/d> prev on Synthroid 37mcg/d per DrKerr, but she was out of meds for over a yr when she returned here 9/11... ~  TSH here in 2007 = 1.32 ~  9/09: note from DrKerr reviewed- she had been off Synthroid & TSH was 13.14 w/ Synthroid restarted. ~  labs here 9/11 off meds showed TSH= 7.79... rec> start SYNTHROID 72mcg/d. ~  labs 11/11 on Levo75 showed TSH= 4.67 ~  Labs 3/12 on Levo75 showed TSH= 1.53  GERD (ICD-530.81) - on OMEPRAZOLE 40mg Bid IRRITABLE BOWEL SYNDROME (ICD-564.1)   << GI - followed by DrWalker in [place]  >> CONSTIPATION (ICD-564.00)  HEMATURIA UNSPECIFIED (ICD-599.70) - she developed gross hematuria 9/11- neg culture; f/u showed microhematuria & referred to Urology;  seen by [facility] 10/11 & his UA was neg- Renal Sonar neg for any lesions or stones & Cysto was neg as well (DrNesi continues to follow pt).  RHEUMATOID ARTHRITIS (ICD-714.0)   <<  Rheum -  followed by DrDeveshwar  >> FIBROMYALGIA (ICD-729.1)                  prev Rx> Pred, MTX, Humira, Enbrel - off all meds since 2010 LOW BACK PAIN (ICD-724.2) ~  she has a hx of RA and Fibromyalgia and was followed by DrDeveshwar for  Rheumatology- prev on Prednisone, Plaquenil, Lyrica, Imipramine, Flexeril, Tramadol, and Vicodin...  ANXIETY (ICD-300.00) - not currently taking medication.  ANEMIA (ICD-285.9) ~  labs 7/09 from [place] showed Hg= 12.4 ~  labs 9/11 here showed Hg= 13.0 ~  Labs 3/12 showed Hg= 12.8  SEBACEOUS CYST, SCALP (ICD-706.2) - this resolved w/ Keflex... she has a hx of MRSA exposure from her child and had a MRSA buttock abscess drained by Trecia Rogers 9/08...   Past Surgical History  Procedure Date  . Tubal ligation   . Asd repair     Outpatient Encounter Prescriptions as of 12/09/2010  Medication Sig Dispense Refill  .  albuterol (PROAIR HFA) 108 (90 BASE) MCG/ACT inhaler Inhale 2 puffs into the lungs every 6 (six) hours as needed.        . cetirizine (ZYRTEC) 10 MG chewable tablet Chew 10 mg by mouth daily.        . Cholecalciferol (VITAMIN D) 1000 UNITS capsule Take 1,000 Units by mouth daily.        Marland Kitchen ibuprofen (ADVIL,MOTRIN) 200 MG tablet Take 200 mg by mouth every 6 (six) hours as needed.        Marland Kitchen levothyroxine (SYNTHROID, LEVOTHROID) 75 MCG tablet       . Multiple Vitamins-Minerals (CENTRUM PO) 1 tab po qd       . omeprazole (PRILOSEC) 40 MG capsule Take 40 mg by mouth 2 (two) times daily.        . pseudoephedrine-guaifenesin (MUCINEX D) 60-600 MG per tablet Take 2 tablets by mouth every 12 (twelve) hours.        . sodium chloride (OCEAN) 0.65 % nasal spray Place 2 sprays into the nose as needed.        Marland Kitchen tiZANidine (ZANAFLEX) 4 MG tablet Take 4 mg by mouth every 8 (eight) hours as needed. DO NOT EXCEED 3 DOSES IN A 24 HOUR PERIOD       . topiramate (TOPAMAX) 100 MG tablet Take 100 mg by mouth daily.          No Known Allergies   Current Medications, Allergies, Past Medical History, Past Surgical History, Family History, and Social History were reviewed in [employer] record.    Review of Systems         See HPI - all other systems neg except as noted...  The  patient complains of intermittent hoarseness, & some aching/ soreness  The patient denies anorexia, fever, weight loss, weight gain, vision loss, decreased hearing, chest pain, syncope, dyspnea on exertion, peripheral edema, prolonged cough, hemoptysis, abdominal pain, melena, hematochezia, severe indigestion/heartburn, hematuria, incontinence, muscle weakness, suspicious skin lesions, transient blindness, difficulty walking, depression, unusual weight change, abnormal bleeding, enlarged lymph nodes, and angioedema.     Objective:   Physical Exam      WD, Thin, [name] in NAD... GENERAL:  Alert & oriented; pleasant & cooperative... HEENT:  Platter/AT, EOM-wnl, PERRLA, EACs-clear, TMs-wnl, NOSE- congested, clear discharge; THROAT-clear & wnl. NECK: supple w/ mild decrROM; no JVD; normal carotid impulses w/o bruits; no thyromegaly or nodules palpated; no lymphadenopathy. CHEST:  Clear to P & A; without wheezes/ rales/ or rhonchi. HEART:  Reg bradycardia;  gr 1 SEM without rubs or gallops... ABDOMEN:  Soft & nontender; normal bowel sounds; no organomegaly or masses detected. EXT: without deformities, mild arthritic changes; no varicose veins/ venous insuffic/ or edema. NEURO:  CNs intact; no focal neuro deficits... DERM:  No lesions noted; no rash etc...   Assessment & Plan:   Hx AR & Asthma>  Stable on antihist, saline, mucinex, FLONASE, & PROAIR prn (also on Omep40Bid & reflux regimen)...  HBP>  Controlled on low salt diet, not on meds; BP looks good, continue to monitor...  CP (non-cardiac), s/p ASD repair, junctional bradycardia>  Followed by Drs Riley Kill & Graciela Husbands- their notes are reviewed... Note that her FLP remains normal on diet alone...  HYPOTHYROID>  Stable clinically & biochemically euthyroid on the dose...  GI> GERD, IBS, Constip>  Followed by DrWalker in [place]...  GU> hx hematuria w/ neg eval>  Followed by Thermon Leyland...  Rheum> RA, FM, LBP>  She has  been followed by DrDeveshwar  in the past (see above)...  Headaches>  Followed by DrFreeman for HAs & improved.Marland KitchenMarland Kitchen

## 2011-01-02 ENCOUNTER — Telehealth: Payer: Self-pay | Admitting: Pulmonary Disease

## 2011-01-02 MED ORDER — AMOXICILLIN-POT CLAVULANATE 875-125 MG PO TABS: 1.0000 | ORAL_TABLET | Freq: Two times a day (BID) | ORAL | Status: AC

## 2011-01-02 NOTE — Telephone Encounter (Signed)
Can have Augmentin 875mg  Twice daily  #14 no refills--make sure to take with food and eat yogurt daily.  mucinex Twice daily   Saline nasal rinses As needed   Please contact office for sooner follow up if symptoms do not improve or worsen or seek emergency care  Will need ov if not improving.

## 2011-01-02 NOTE — Telephone Encounter (Signed)
Patient is aware of all recs per TP and will call for OV if her sxs do not improve or seek emergency care if sxs are worse.

## 2011-01-02 NOTE — Telephone Encounter (Signed)
Pt says she was given rx for amoxicillin 500 mg, 1 po tid for 10 days on Sun., 8/12 after being seen in the ER. She says her sxs have improved slightly but wanted additional recs from SN. Pt aware SN is out of the office today and we will forward msg to TP in the meantime for recs. Pt still c/o sinus pain and pressure, right ear pain and headaches. Pls advise if there is anything she can do in addition to the abx. No Known Allergies

## 2011-01-04 ENCOUNTER — Emergency Department (HOSPITAL_COMMUNITY)
Admission: EM | Admit: 2011-01-04 | Discharge: 2011-01-04 | Disposition: A | Discharge status: Home / Self Care | Payer: BC Managed Care – PPO | Attending: Emergency Medicine | Admitting: Emergency Medicine

## 2011-01-04 DIAGNOSIS — R51 Headache: Secondary | ICD-10-CM | POA: Insufficient documentation

## 2011-01-04 DIAGNOSIS — Z9889 Other specified postprocedural states: Secondary | ICD-10-CM | POA: Insufficient documentation

## 2011-01-04 DIAGNOSIS — E039 Hypothyroidism, unspecified: Secondary | ICD-10-CM | POA: Insufficient documentation

## 2011-01-04 DIAGNOSIS — IMO0001 Reserved for inherently not codable concepts without codable children: Secondary | ICD-10-CM | POA: Insufficient documentation

## 2011-01-04 DIAGNOSIS — Z79899 Other long term (current) drug therapy: Secondary | ICD-10-CM | POA: Insufficient documentation

## 2011-01-04 DIAGNOSIS — R112 Nausea with vomiting, unspecified: Secondary | ICD-10-CM | POA: Insufficient documentation

## 2011-01-04 DIAGNOSIS — K219 Gastro-esophageal reflux disease without esophagitis: Secondary | ICD-10-CM | POA: Insufficient documentation

## 2011-01-04 DIAGNOSIS — I10 Essential (primary) hypertension: Secondary | ICD-10-CM | POA: Insufficient documentation

## 2011-01-04 DIAGNOSIS — M069 Rheumatoid arthritis, unspecified: Secondary | ICD-10-CM | POA: Insufficient documentation

## 2011-01-04 DIAGNOSIS — J3489 Other specified disorders of nose and nasal sinuses: Secondary | ICD-10-CM | POA: Insufficient documentation

## 2011-01-04 DIAGNOSIS — J329 Chronic sinusitis, unspecified: Secondary | ICD-10-CM | POA: Insufficient documentation

## 2011-01-04 LAB — POCT I-STAT, CHEM 8
BUN: 11 mg/dL (ref 6–23)
Calcium, Ion: 1.19 mmol/L (ref 1.12–1.32)
Chloride: 105 mEq/L (ref 96–112)
Creatinine, Ser: 0.7 mg/dL (ref 0.50–1.10)
Glucose, Bld: 80 mg/dL (ref 70–99)
HCT: 41 % (ref 36.0–46.0)
Hemoglobin: 13.9 g/dL (ref 12.0–15.0)
Potassium: 3.9 mEq/L (ref 3.5–5.1)
Sodium: 137 mEq/L (ref 135–145)
TCO2: 24 mmol/L (ref 0–100)

## 2011-01-05 ENCOUNTER — Telehealth: Payer: Self-pay | Admitting: Pulmonary Disease

## 2011-01-05 NOTE — Telephone Encounter (Signed)
LMTCB

## 2011-01-05 NOTE — Telephone Encounter (Signed)
This call is from [facility] triage call report: Pt calling for vomiting. Onset 01/03/11. Is feeling hot one minute and chills the next.  Is being treated for sinusitis, being treated with augmentin. Vomited x5 since yesterday.  Last urination this a.m.  Does not have thermometer.  Lips and inside of her mouth are very dry.  Mild lower abd pain.  Eyes are severely painful to the touch and when trying to keep eyes open.  All 911 sxs per eye: pain or vision change protocol r/o.  Advised ED per guidelines.Raylene Everts

## 2011-01-06 NOTE — Telephone Encounter (Signed)
Pt was seen in the ER on 01-04-11 for the following symptoms and treated. [name], CMA

## 2011-01-07 ENCOUNTER — Telehealth: Payer: Self-pay | Admitting: Pulmonary Disease

## 2011-01-07 NOTE — Telephone Encounter (Signed)
Pt forgot she had spoke with Victorino Dike on 8/21 and said she is doing better now and nothing further is needed.

## 2011-01-22 ENCOUNTER — Ambulatory Visit (INDEPENDENT_AMBULATORY_CARE_PROVIDER_SITE_OTHER): Payer: BC Managed Care – PPO | Admitting: Pulmonary Disease

## 2011-01-22 ENCOUNTER — Telehealth: Payer: Self-pay | Admitting: Pulmonary Disease

## 2011-01-22 ENCOUNTER — Encounter: Payer: Self-pay | Admitting: Pulmonary Disease

## 2011-01-22 DIAGNOSIS — Q211 Atrial septal defect: Secondary | ICD-10-CM

## 2011-01-22 DIAGNOSIS — R51 Headache: Secondary | ICD-10-CM

## 2011-01-22 DIAGNOSIS — K219 Gastro-esophageal reflux disease without esophagitis: Secondary | ICD-10-CM

## 2011-01-22 DIAGNOSIS — I10 Essential (primary) hypertension: Secondary | ICD-10-CM

## 2011-01-22 DIAGNOSIS — E039 Hypothyroidism, unspecified: Secondary | ICD-10-CM

## 2011-01-22 DIAGNOSIS — M069 Rheumatoid arthritis, unspecified: Secondary | ICD-10-CM

## 2011-01-22 DIAGNOSIS — K589 Irritable bowel syndrome without diarrhea: Secondary | ICD-10-CM

## 2011-01-22 DIAGNOSIS — I495 Sick sinus syndrome: Secondary | ICD-10-CM

## 2011-01-22 DIAGNOSIS — F411 Generalized anxiety disorder: Secondary | ICD-10-CM

## 2011-01-22 DIAGNOSIS — J329 Chronic sinusitis, unspecified: Secondary | ICD-10-CM

## 2011-01-22 DIAGNOSIS — Q2111 Secundum atrial septal defect: Secondary | ICD-10-CM

## 2011-01-22 DIAGNOSIS — IMO0001 Reserved for inherently not codable concepts without codable children: Secondary | ICD-10-CM

## 2011-01-22 MED ORDER — MOXIFLOXACIN HCL 400 MG PO TABS: 400.0000 mg | ORAL_TABLET | Freq: Every day | ORAL | Status: AC

## 2011-01-22 MED ORDER — PREDNISONE 20 MG PO TABS: ORAL_TABLET | ORAL | Status: DC

## 2011-01-22 MED ORDER — HYDROCODONE-ACETAMINOPHEN 5-500 MG PO TABS: 1.0000 | ORAL_TABLET | Freq: Four times a day (QID) | ORAL | Status: AC | PRN

## 2011-01-22 MED ORDER — METHYLPREDNISOLONE ACETATE 80 MG/ML IJ SUSP
80.0000 mg | Freq: Once | INTRAMUSCULAR | Status: AC
  Administered 2011-01-22: 80 mg via INTRA_ARTICULAR

## 2011-01-22 NOTE — Telephone Encounter (Signed)
Spoke with pt. She is c/o sinus pressure and fever. Requests ov with SN. He had opening at 2 pm so pt sched.

## 2011-01-22 NOTE — Progress Notes (Signed)
Subjective:    Patient ID: Brittany Hodges, female    DOB: [birth year], 45 y.o.   MRN: [id number]  HPI [name] here for a follow up visit...  she has mult medical problems including AR & Asthma;  HBP;  Hx CP & ASD repair;  Hypothyroid;  GERD/ IBS/ constipation;  hx hematuria evaluated by Thermon Leyland;  RA/ FM/ LBP follwed by DrDeveshwar;  Chronic headaches/ Migraines followed by DrFreeman;  Anxiety...  ~  January 22, 2010:  14 month ROV- she's been OOW x several yrs and unemployment ran out,  just got new job & here to re-establish> she is off all prev meds (see below) & only taking MVI, Vit D, & Dexilant samples from [facility]... she was seen by ENT in [place] recently w/ throat symptoms related to reflux & Dexilant started;  also found to have TMJ problem, and c/o sinus congestion> we discussed Saline, Nasonex, Breathe-right nasal strips Qhs... non-fasting labs done today & WNL x TSH=7.79 & Synthroid 39mcg/d started...  ~  March 25, 2010:  she is feeling sl better on the Levothy75 & we will recheck her TSH today (4.67) to adjust as needed;  FLP 10/11 by Cards looked good;  HR is chronically slow (40's) & she denies recurrent CP, not dizzy etc...    She saw DrKlein 9/11for CP & Bradycardia (junctional brady) in the setting of prev ASD repair- not on BP/vasoactive meds, heart rate in the 40's... she had Myoview 10/11 which was abnormal w/ mild revers defect in ant wall, mild LV dil & EF= 52%... she tells me they are trying to hold off on pacemaker.   She also saw DrNesi due to episode of gross then micro-hematuria> his UA was clear & Sonar is planned & pending at this time...   ~  July 23, 2010:  Presents c/o right ear pain, sinus congestion & drainage, no help from OTC Zyrtek;  she saw TP 2/12 w/ LER & Omep40 incr to Bid;  we discussed rx w/ Augmentin, Depo/ Dosepak, Mucinex, Saline nasal sp & Flonase (all symptoms resolved on meds)...    She saw DrNesi 11/11 for f/u hematuria> renal sonar neg for any  lesions or stones, Cysto was neg as well & they plan routine f/u to check for any recurrent blood...    She also had a f/u DrFreeman 2/12 for her Migraines> on Topamax (75mg /d) prevention & Zanaflex (4mg ) as needed;  he did mult trigger point injections which he says was very helpful to her in the past (& they helped again)...  ~  July 24,2012:  Beatric had persist CP 3/12 & DrKlein ordered a Cath> see below: cath neg w/ norm LVF & no signif CAD; she has been doing well, exercising daily w/o difficulty, denies palpit/ dizzy/ syncope/ etc... She also had f/u DrNesi last week & reports that her urine was clear, no blood...  We reviewed her interim cardiac visits, tests, & labs...  Her CC= some diffuse arhtritic pain & she uses OTC Ibuprofen Prn...  ~  January 22, 2011:  6wk ROV & add-on for sinus pressure & headache all over but the back-story is very interesting> she has had 2 ER visits ([facility] & [place]), mult phone calls w/ meds called-in & visit w/ [place] [facility] & CT sinus done yest> all for the same symptoms of sinus pressure, drainage, & HA all over ("even my eyes & teeth hurt"); recall hx chronic headaches managed by DrFreeman's clinic on Topamax &  Zanaflex but "shots in neck" helped the most she said;  Over the last month she has had several antibiotics, pred, pain meds, & nausea med (she didn't bring any list or bottles)...  CT Sinus done 01/21/11 at [facility] was essentially neg w/o acute sinusitis & min chronic changes...  She desperately wants treatment & we agreed to prescribe Avelox, Depo80, Pred taper, Mucinex, Saline, Vicodin> but she really needs to see DrFreeman for HA clinic follow up.    >Hx asthma w/o recent exac & controlled on prn Proair...    >Hx borderline BP, AtypCWP w/ neg cath, Hx ASD repair yrs ago & followed by [facility] and stable...    >GU- hx hematuria actively followed by DrNesi...    >Rheum- hx RA & FM prev followed by DrDeveshwar...          Problem List:   As noted> she has mult  specialists tending to ALL her medical problems...  ALLERGIC RHINITIS (ICD-477.9) - on OTC Antihist (eg- ZYRTEK) Qam; Saline nasal sp every 1-2 H during the day + MUCINEX 2Bid; & FLONASE 2spQhs... rec to use breathe right nasal strips at bedtime Prn to help w/ drainage... she knows to avoid Afrin due to BP. ~  9/11:  She reports that recent ENT eval in [place] showed reflux related ENT symptoms & Rx w/ Dexilant> ch to OMEPRAZOLE 40mg Bid. ~  9/12:  She reports recent ENT eval in [place] for "sinus pressure" & CT scan showed essentially neg w/o acute sinusitis & min chronic changes...  ASTHMA (ICD-493.90) -  uses PROAIR as needed... denies cough, sputum, hemoptysis, worsening dyspnea, wheezing, chest pains, snoring, daytime hypersomnolence, etc... On the OMEP Bid & antireflux regimen to help nocturnal symptoms... ~  CXR 8/09 showed prev median sternotomy, heart norm size, lungs clear, +scoliosis...  HYPERTENSION (ICD-401.9) - controlled on low salt diet alone (prev on Norvasc & avoiding meds due to her bradycardia)... denies visual changes, CP, palipit, dizziness, syncope, dyspnea, edema, etc... BP= 110/62 today & doing satis w/o medication now...  Hx of CHEST PAIN (ICD-786.50) >> most of it is AtypCWP related to her FM... Hx of ATRIAL SEPTAL DEFECT (ICD-745.5) - S/P ASD repair 1985> She is followed by DrStuckey & DrKlein... ~  EKG shows junctional bradycardia & NSSTTWA... ~  2DEcho 7/06 showed tr MR & TR, normal LVF... ~  Followed by DrKlein for hx SA node dysfunction/ junctional rhythm in the setting of prev ASD repair- doing OK/ stable rhythm/ good exerc capacity. ~  She saw DrKlein 9/11 for CP & Bradycardia (junctional brady)- not on BP/vasoactive meds, heart rate in the 40's> she tells me they are trying to hold off on pacemaker as long as poss. ~  Myoview 10/11 was abnormal w/ mild revers defect in ant wall, mild LV dil & EF= 52%... ~  persistant vs recurrent CP 3/12 lead to Cath by DrStuckey> norm  coronaries w/ min luminal irregularities, well preserved LVF... ~  2DEcho 4/12 showed norm LV size & funct w/ EF=55-60%, no regional wall motion abn, mildMR, trivAI, +atrial septal aneurysm  HYPOTHYROIDISM (ICD-244.9) - on SYNTHROID 86mcg/d> prev on Synthroid 54mcg/d per DrKerr, but she was out of meds for over a yr when she returned here 9/11... ~  TSH here in 2007 = 1.32 ~  9/09: note from DrKerr reviewed- she had been off Synthroid & TSH was 13.14 w/ Synthroid restarted. ~  labs here 9/11 off meds showed TSH= 7.79... rec> start SYNTHROID 67mcg/d. ~  labs 11/11 on  Levo75 showed TSH= 4.67 ~  Labs 3/12 on Levo75 showed TSH= 1.53  GERD (ICD-530.81) - on OMEPRAZOLE 40mg Bid IRRITABLE BOWEL SYNDROME (ICD-564.1)   << GI - followed by DrWalker in [place]  >> CONSTIPATION (ICD-564.00)  HEMATURIA UNSPECIFIED (ICD-599.70) - she developed gross hematuria 9/11- neg culture; f/u showed microhematuria & referred to Urology;  seen by [facility] 10/11 & his UA was neg- Renal Sonar neg for any lesions or stones & Cysto was neg as well (DrNesi continues to follow pt).  RHEUMATOID ARTHRITIS (ICD-714.0)   <<  Rheum -  followed by DrDeveshwar  >> FIBROMYALGIA (ICD-729.1)                  prev Rx> Pred, MTX, Humira, Enbrel - off all meds since 2010 LOW BACK PAIN (ICD-724.2) ~  she has a hx of RA and Fibromyalgia and was followed by DrDeveshwar for Rheumatology- prev on Prednisone, Plaquenil, Lyrica, Imipramine, Flexeril, Tramadol, and Vicodin...  HEADACHES >> Hx migraine HAs and mixed muscle contraction HAs in the past;  Treated by DrFreeman's [facility] w/ Topamax 100mg /d & ZANAFLEX 4mg  Tid prn;  He has also given her shots in the neck which really helped in the past...  ANXIETY (ICD-300.00) - not currently taking medication.  ANEMIA (ICD-285.9) ~  labs 7/09 from [place] showed Hg= 12.4 ~  labs 9/11 here showed Hg= 13.0 ~  Labs 3/12 showed Hg= 12.8  SEBACEOUS CYST, SCALP (ICD-706.2) - this resolved w/ Keflex... she has a  hx of MRSA exposure from her child and had a MRSA buttock abscess drained by Trecia Rogers 9/08...   Past Surgical History  Procedure Date  . Tubal ligation   . Asd repair     Outpatient Encounter Prescriptions as of 01/22/2011  Medication Sig Dispense Refill  . albuterol (PROAIR HFA) 108 (90 BASE) MCG/ACT inhaler Inhale 2 puffs into the lungs every 6 (six) hours as needed.        . cetirizine (ZYRTEC) 10 MG chewable tablet Chew 10 mg by mouth daily.        . Cholecalciferol (VITAMIN D) 1000 UNITS capsule Take 1,000 Units by mouth daily.        Marland Kitchen ibuprofen (ADVIL,MOTRIN) 200 MG tablet Take 200 mg by mouth every 6 (six) hours as needed.        Marland Kitchen levothyroxine (SYNTHROID, LEVOTHROID) 75 MCG tablet Take 75 mcg by mouth daily.       . Multiple Vitamins-Minerals (CENTRUM PO) 1 tab po qd       . omeprazole (PRILOSEC) 40 MG capsule Take 40 mg by mouth 2 (two) times daily.        . pseudoephedrine-guaifenesin (MUCINEX D) 60-600 MG per tablet Take 2 tablets by mouth every 12 (twelve) hours.        . sodium chloride (OCEAN) 0.65 % nasal spray Place 2 sprays into the nose as needed.        Marland Kitchen tiZANidine (ZANAFLEX) 4 MG tablet Take 4 mg by mouth every 8 (eight) hours as needed. DO NOT EXCEED 3 DOSES IN A 24 HOUR PERIOD       . topiramate (TOPAMAX) 100 MG tablet Take 100 mg by mouth daily.          No Known Allergies   Current Medications, Allergies, Past Medical History, Past Surgical History, Family History, and Social History were reviewed in [employer] record.    Review of Systems         See HPI -  all other systems neg except as noted...  The patient complains of intermittent hoarseness, & some aching/ soreness  The patient denies anorexia, fever, weight loss, weight gain, vision loss, decreased hearing, chest pain, syncope, dyspnea on exertion, peripheral edema, prolonged cough, hemoptysis, abdominal pain, melena, hematochezia, severe indigestion/heartburn, hematuria,  incontinence, muscle weakness, suspicious skin lesions, transient blindness, difficulty walking, depression, unusual weight change, abnormal bleeding, enlarged lymph nodes, and angioedema.     Objective:   Physical Exam      WD, Thin, [name] in NAD... GENERAL:  Alert & oriented; pleasant & cooperative... HEENT:  [place]/AT, EOM-wnl, PERRLA, EACs-clear, TMs-wnl, NOSE- congested, clear discharge; THROAT-clear & wnl. NECK: supple w/ mild decrROM; no JVD; normal carotid impulses w/o bruits; no thyromegaly or nodules palpated; no lymphadenopathy. CHEST:  Clear to P & A; without wheezes/ rales/ or rhonchi. HEART:  Reg bradycardia;  gr 1 SEM without rubs or gallops... ABDOMEN:  Soft & nontender; normal bowel sounds; no organomegaly or masses detected. EXT: without deformities, mild arthritic changes; no varicose veins/ venous insuffic/ or edema. NEURO:  CNs intact; no focal neuro deficits... +trigger points esp in trapezius area... DERM:  No lesions noted; no rash etc...   Assessment & Plan:   HEADACHE & Sinus congestion>  We agreed to treat her aggressively w/ Avelox, Prednisone, Mucinex, Saline, & Vicodin for pain; ROV in 2 weeks to check response;  She needs on-going management of her chronic HAs by DrFreeman...   Hx AR & Asthma>  Stable on antihist, saline, mucinex, FLONASE, & PROAIR prn (also on Omep40Bid & reflux regimen)...  HBP>  Controlled on low salt diet, not on meds; BP looks good, continue to monitor...  CP (non-cardiac), s/p ASD repair, junctional bradycardia>  Followed by Drs Riley Kill & Graciela Husbands- their notes are reviewed... Note that her FLP remains normal on diet alone...  HYPOTHYROID>  Stable clinically & biochemically euthyroid on the dose...  GI> GERD, IBS, Constip>  Followed by DrWalker in [place]...  GU> hx hematuria w/ neg eval>  Followed by Thermon Leyland...  Rheum> RA, FM, LBP>  She has been followed by DrDeveshwar in the past (see above).Marland KitchenMarland Kitchen

## 2011-01-22 NOTE — Patient Instructions (Signed)
Today we updated your med list in EPIC...    We wrote new prescriptions for AVELOX antibiotic to take one daily x7d; and PREDNISONE to take as directed in a slow tapering schedule.    We also wrote for some VICODIN to use for the severe pain (one every 6H as needed)...  Continue w/ the MUCINEX 2 tabs twice daily & lots of fluids...    Continue w/ the Nasal Saline Mist every 1-2H while awake...  We are trying to get the CT result from [place] [facility]...  Let's plan a follow up recheck in 2 weeks.Marland KitchenMarland Kitchen

## 2011-01-23 ENCOUNTER — Telehealth: Payer: Self-pay | Admitting: Pulmonary Disease

## 2011-01-23 NOTE — Telephone Encounter (Signed)
Per Leigh, pt needs to finish entire course of abx. LMOMTCB x 1.

## 2011-01-24 ENCOUNTER — Encounter: Payer: Self-pay | Admitting: Pulmonary Disease

## 2011-01-26 NOTE — Telephone Encounter (Signed)
Called, spoke with pt.  Per pt, CT showed no sinus infection.  She is aware to continue to take entire course of abx as recommended below.  She verbalized understanding of this and would like to know if she should also cont to take the prednisone since CT did not show an infection.  Please advise.  Thanks!

## 2011-01-26 NOTE — Telephone Encounter (Signed)
[email or web address]  

## 2011-01-26 NOTE — Telephone Encounter (Signed)
Yes finish abx and steroid taper as recommended.  follow up as planned and As needed   Please contact office for sooner follow up if symptoms do not improve or worsen or seek emergency care

## 2011-01-26 NOTE — Telephone Encounter (Signed)
Pt returned call. Call her cell # [phone]. Brittany Hodges

## 2011-01-27 NOTE — Telephone Encounter (Signed)
[email or web address]  

## 2011-01-28 NOTE — Telephone Encounter (Signed)
[email or web address]  

## 2011-01-29 NOTE — Telephone Encounter (Signed)
Pt aware to finish course of meds.Brittany Hodges, CMA

## 2011-02-05 ENCOUNTER — Telehealth: Payer: Self-pay | Admitting: Pulmonary Disease

## 2011-02-05 NOTE — Telephone Encounter (Signed)
Per leigh bring pt in 10/30 at 4 pm. Pt was fine with apt and is aware to call in the meantime if she has any problems

## 2011-02-06 ENCOUNTER — Ambulatory Visit: Payer: BC Managed Care – PPO | Admitting: Pulmonary Disease

## 2011-02-10 ENCOUNTER — Encounter: Payer: Self-pay | Admitting: Internal Medicine

## 2011-02-10 ENCOUNTER — Ambulatory Visit (INDEPENDENT_AMBULATORY_CARE_PROVIDER_SITE_OTHER): Payer: BC Managed Care – PPO | Admitting: Internal Medicine

## 2011-02-10 VITALS — BP 124/82 | HR 46 | Ht 68.5 in | Wt 141.0 lb

## 2011-02-10 DIAGNOSIS — R079 Chest pain, unspecified: Secondary | ICD-10-CM

## 2011-02-10 DIAGNOSIS — I495 Sick sinus syndrome: Secondary | ICD-10-CM

## 2011-02-10 DIAGNOSIS — R42 Dizziness and giddiness: Secondary | ICD-10-CM | POA: Insufficient documentation

## 2011-02-10 DIAGNOSIS — I498 Other specified cardiac arrhythmias: Secondary | ICD-10-CM

## 2011-02-10 NOTE — Assessment & Plan Note (Signed)
Sinus bradycardia without clear symptoms. Her dizziness seems to be more orthostatic. Exercise heart rate seems to be adequate.

## 2011-02-10 NOTE — Progress Notes (Signed)
HPI  Brittany Hodges is a 45 y.o. female is seen in followup for junctional rhythm in the setting of atrial septal defect repair. In the past she has had assessments of contropic competence which have been adequate. Exercise recently has done pretty well. She does have mild limitations but they seem to be somewhat less than previously.   She continues to have episodes of chest discomfort. These are unrelated to exertion..  She undeerwent myoview scanning inn the fall 2011 demonstrating mild ischemia v attenusation with ef 52% and min LV dilitation. "following increase in thyroid replacement her symptoms improved.    Past Medical History  Diagnosis Date  . Allergic rhinitis, cause unspecified   . Asthma   . Chest pain, unspecified     cardiac cath 07/31/10: normal coronaries; vigorous LVF  . Atrial septal defect     s/p repair;   Echo 08/07/10: EF 55-60%; mild MR; atrial septal aneurysm; no residual ASD  . Sinoatrial node dysfunction     eval. for chronotropic competence completed in past  . Hypothyroidism   . Esophageal reflux   . Irritable bowel syndrome   . Hematuria, unspecified   . Rheumatoid arthritis   . Fibromyalgia   . Anxiety   . Anemia   . Sebaceous cyst   . Hypertension   . Low back pain   . Headache     Past Surgical History  Procedure Date  . Tubal ligation   . Asd repair     Current Outpatient Prescriptions  Medication Sig Dispense Refill  . albuterol (PROAIR HFA) 108 (90 BASE) MCG/ACT inhaler Inhale 2 puffs into the lungs every 6 (six) hours as needed.        . cetirizine (ZYRTEC) 10 MG chewable tablet Chew 10 mg by mouth daily.        . Cholecalciferol (VITAMIN D) 1000 UNITS capsule Take 1,000 Units by mouth daily.        Marland Kitchen ibuprofen (ADVIL,MOTRIN) 200 MG tablet Take 200 mg by mouth every 6 (six) hours as needed.        Marland Kitchen levothyroxine (SYNTHROID, LEVOTHROID) 75 MCG tablet Take 75 mcg by mouth daily.       . Multiple Vitamins-Minerals (CENTRUM PO) 1  tab po qd       . omeprazole (PRILOSEC) 40 MG capsule Take 40 mg by mouth 2 (two) times daily.        . polyethylene glycol powder (GLYCOLAX/MIRALAX) powder as directed.      . predniSONE (DELTASONE) 20 MG tablet Take 1 tab  Bid x 3 days, 1 tab daily x 3 days, 1/2 tab  Daily until gone  15 tablet  0  . pseudoephedrine-guaifenesin (MUCINEX D) 60-600 MG per tablet Take 2 tablets by mouth every 12 (twelve) hours.        . sodium chloride (OCEAN) 0.65 % nasal spray Place 2 sprays into the nose as needed.        Marland Kitchen tiZANidine (ZANAFLEX) 4 MG tablet Take 4 mg by mouth every 8 (eight) hours as needed. DO NOT EXCEED 3 DOSES IN A 24 HOUR PERIOD       . topiramate (TOPAMAX) 100 MG tablet Take 100 mg by mouth daily.          No Known Allergies  Review of Systems negative except from HPI and PMH  Physical Exam Well developed and well nourished in no acute distress HENT normal E scleral and icterus clear Neck Supple JVP flat; carotids brisk and  full Clear to ausculation Regular rate and rhythm, 2/6 systolic murmur Soft with active bowel sounds No clubbing cyanosis and edema Alert and oriented, grossly normal motor and sensory function Skin Warm and Dry  ECG Sinus rhythm at 46 Interval 0.13/0.10/24 3 Axis is 87 Assessment and  Plan

## 2011-02-10 NOTE — Patient Instructions (Signed)
Your physician wants you to follow-up in: 6 months  You will receive a reminder letter in the mail two months in advance. If you don't receive a letter, please call our office to schedule the follow-up appointment.  Your physician recommends that you continue on your current medications as directed. Please refer to the Current Medication list given to you today.  

## 2011-02-10 NOTE — Assessment & Plan Note (Signed)
She continues with episodes of atypical chest pain. Her Myoview is non-ischemic essentially a year ago.

## 2011-02-10 NOTE — Assessment & Plan Note (Signed)
Primarily orthostatic in nature. I doubt it is related to her bradycardia

## 2011-03-05 LAB — URINALYSIS, ROUTINE W REFLEX MICROSCOPIC
Bilirubin Urine: NEGATIVE
Glucose, UA: NEGATIVE
Hgb urine dipstick: NEGATIVE
Ketones, ur: 15 — AB
Nitrite: NEGATIVE
Protein, ur: NEGATIVE
Specific Gravity, Urine: 1.028
Urobilinogen, UA: 0.2
pH: 5.5

## 2011-03-05 LAB — COMPREHENSIVE METABOLIC PANEL
ALT: 15
AST: 28
Albumin: 4.2
Alkaline Phosphatase: 47
BUN: 13
CO2: 20
Calcium: 8.9
Chloride: 107
Creatinine, Ser: 0.8
GFR calc Af Amer: >60
GFR calc non Af Amer: >60
Glucose, Bld: 122 — ABNORMAL HIGH
Potassium: 4.1
Sodium: 135
Total Bilirubin: 0.8
Total Protein: 8.2

## 2011-03-05 LAB — POCT PREGNANCY, URINE
Operator id: 285841
Preg Test, Ur: NEGATIVE

## 2011-03-05 LAB — CBC
HCT: 40.3
Hemoglobin: 13.4
MCHC: 33.1
MCV: 88.7
Platelets: 162
RBC: 4.55
RDW: 13.2
WBC: 4.1

## 2011-03-05 LAB — DIFFERENTIAL
Basophils Absolute: 0
Basophils Relative: 0
Eosinophils Absolute: 0
Eosinophils Relative: 0
Lymphocytes Relative: 20
Lymphs Abs: 0.8
Monocytes Absolute: 0.3
Monocytes Relative: 6
Neutro Abs: 3
Neutrophils Relative %: 73

## 2011-03-05 LAB — RPR: RPR Ser Ql: NONREACTIVE

## 2011-03-17 ENCOUNTER — Ambulatory Visit (INDEPENDENT_AMBULATORY_CARE_PROVIDER_SITE_OTHER): Payer: BC Managed Care – PPO | Admitting: Pulmonary Disease

## 2011-03-17 ENCOUNTER — Encounter: Payer: Self-pay | Admitting: Pulmonary Disease

## 2011-03-17 DIAGNOSIS — F411 Generalized anxiety disorder: Secondary | ICD-10-CM

## 2011-03-17 DIAGNOSIS — I495 Sick sinus syndrome: Secondary | ICD-10-CM

## 2011-03-17 DIAGNOSIS — IMO0001 Reserved for inherently not codable concepts without codable children: Secondary | ICD-10-CM

## 2011-03-17 DIAGNOSIS — M069 Rheumatoid arthritis, unspecified: Secondary | ICD-10-CM

## 2011-03-17 DIAGNOSIS — K219 Gastro-esophageal reflux disease without esophagitis: Secondary | ICD-10-CM

## 2011-03-17 DIAGNOSIS — J45909 Unspecified asthma, uncomplicated: Secondary | ICD-10-CM

## 2011-03-17 DIAGNOSIS — R51 Headache: Secondary | ICD-10-CM

## 2011-03-17 DIAGNOSIS — E039 Hypothyroidism, unspecified: Secondary | ICD-10-CM

## 2011-03-17 DIAGNOSIS — Q2111 Secundum atrial septal defect: Secondary | ICD-10-CM

## 2011-03-17 DIAGNOSIS — Z23 Encounter for immunization: Secondary | ICD-10-CM

## 2011-03-17 DIAGNOSIS — K589 Irritable bowel syndrome without diarrhea: Secondary | ICD-10-CM

## 2011-03-17 DIAGNOSIS — Q211 Atrial septal defect: Secondary | ICD-10-CM

## 2011-03-17 DIAGNOSIS — D649 Anemia, unspecified: Secondary | ICD-10-CM

## 2011-03-17 NOTE — Patient Instructions (Signed)
Today we updated your med list in our EPIC system...    Continue your current medications the same...  Let's plan a follow up visit in 4-6 weeks w/ fasting blood work at that time.Marland KitchenMarland Kitchen

## 2011-03-30 ENCOUNTER — Encounter: Payer: Self-pay | Admitting: Pulmonary Disease

## 2011-03-30 NOTE — Progress Notes (Signed)
Subjective:    Patient ID: Brittany Hodges, female    DOB: 25-Nov-1965, 45 y.o.   MRN: [id number]  HPI [name] here for a follow up visit...  she has mult medical problems including AR & Asthma;  HBP;  Hx CP & ASD repair;  Hypothyroid;  GERD/ IBS/ constipation;  hx hematuria evaluated by Thermon Leyland;  RA/ FM/ LBP follwed by DrDeveshwar;  Chronic headaches/ Migraines followed by DrFreeman;  Anxiety...  ~  January 22, 2010:  14 month ROV- she's been OOW x several yrs and unemployment ran out,  just got new job & here to re-establish> she is off all prev meds (see below) & only taking MVI, Vit D, & Dexilant samples from [facility]... she was seen by ENT in [place] recently w/ throat symptoms related to reflux & Dexilant started;  also found to have TMJ problem, and c/o sinus congestion> we discussed Saline, Nasonex, Breathe-right nasal strips Qhs... non-fasting labs done today & WNL x TSH=7.79 & Synthroid 101mcg/d started...  ~  March 25, 2010:  she is feeling sl better on the Levothy75 & we will recheck her TSH today (4.67) to adjust as needed;  FLP 10/11 by Cards looked good;  HR is chronically slow (40's) & she denies recurrent CP, not dizzy etc...    She saw DrKlein 9/11for CP & Bradycardia (junctional brady) in the setting of prev ASD repair- not on BP/vasoactive meds, heart rate in the 40's... she had Myoview 10/11 which was abnormal w/ mild revers defect in ant wall, mild LV dil & EF= 52%... she tells me they are trying to hold off on pacemaker.   She also saw DrNesi due to episode of gross then micro-hematuria> his UA was clear & Sonar is planned & pending at this time...   ~  July 23, 2010:  Presents c/o right ear pain, sinus congestion & drainage, no help from OTC Zyrtek;  she saw TP 2/12 w/ LER & Omep40 incr to Bid;  we discussed rx w/ Augmentin, Depo/ Dosepak, Mucinex, Saline nasal sp & Flonase (all symptoms resolved on meds)...    She saw DrNesi 11/11 for f/u hematuria> renal sonar neg for any  lesions or stones, Cysto was neg as well & they plan routine f/u to check for any recurrent blood...    She also had a f/u DrFreeman 2/12 for her Migraines> on Topamax (75mg /d) prevention & Zanaflex (4mg ) as needed;  he did mult trigger point injections which he says was very helpful to her in the past (& they helped again)...  ~  July 24,2012:  Cl[place] had persist CP 3/12 & DrKlein ordered a Cath> see below: cath neg w/ norm LVF & no signif CAD; she has been doing well, exercising daily w/o difficulty, denies palpit/ dizzy/ syncope/ etc... She also had f/u DrNesi last week & reports that her urine was clear, no blood...  We reviewed her interim cardiac visits, tests, & labs...  Her CC= some diffuse arhtritic pain & she uses OTC Ibuprofen Prn...  ~  January 22, 2011:  6wk ROV & add-on for sinus pressure & headache all over but the back-story is very interesting> she has had 2 ER visits ([facility] & [place]), mult phone calls w/ meds called-in & visit w/ [place] [facility] & CT sinus done yest> all for the same symptoms of sinus pressure, drainage, & HA all over ("even my eyes & teeth hurt"); recall hx chronic headaches managed by DrFreeman's clinic on Topamax &  Zanaflex but "shots in neck" helped the most she said;  Over the last month she has had several antibiotics, pred, pain meds, & nausea med (she didn't bring any list or bottles)...  CT Sinus done 01/21/11 at [facility] was essentially neg w/o acute sinusitis & min chronic changes...  She desperately wants treatment & we agreed to prescribe Avelox, Depo80, Pred taper, Mucinex, Saline, Vicodin> but she really needs to see DrFreeman for HA clinic follow up... See prob list below:  ~  March 17, 2011:  6wk ROV & she reports f/u w/ DrFreeman [facility] (note pending) dx as migraines & related to her hormones & the weather she says; they inr her Topamax to 150mg /d & injected some trigger points- she feels better;  She finished the sinusitis Rx & improved as well-  she is asked to f/u w/ her ENT for any further sinus problems;  She saw DrKlein 9/12 for f/u of a junctional rhythm in the setting of her ASD repair- SAnode dysfunction w/ [name], she is asymptomatic & exercise heart rate is adeq, atypCP w/ non-ischemic myoview 2011> no changes made...  See prob list below:          Problem List:   As noted> she has mult specialists tending to ALL her medical problems...  ALLERGIC RHINITIS (ICD-477.9) - on OTC Antihist (eg- ZYRTEK) Qam; Saline nasal sp every 1-2 H during the day + MUCINEX 2Bid; & FLONASE 2spQhs... rec to use breathe right nasal strips at bedtime Prn to help w/ drainage... she knows to avoid Afrin due to BP. ~  9/11:  She reports that recent ENT eval in [place] showed reflux related ENT symptoms & Rx w/ Dexilant> ch to OMEPRAZOLE 40mg Bid. ~  9/12:  She reports recent ENT eval in [place] for "sinus pressure" & CT scan showed essentially neg w/o acute sinusitis & min chronic changes...  ASTHMA (ICD-493.90) -  uses PROAIR as needed... denies cough, sputum, hemoptysis, worsening dyspnea, wheezing, chest pains, snoring, daytime hypersomnolence, etc... On the OMEP Bid & antireflux regimen to help nocturnal symptoms... ~  CXR 8/09 showed prev median sternotomy, heart norm size, lungs clear, +scoliosis...  HYPERTENSION (ICD-401.9) - controlled on low salt diet alone (prev on Norvasc & avoiding meds due to her bradycardia)...  ~  9/12: BP= 110/62 today & doing satis w/o medication; denies visual changes, CP, palipit, dizziness, syncope, dyspnea, edema, etc...  ~  10/12: BP= 118/78 & stable- remains asymptomatic x for her mult somatic complaints...  Hx of CHEST PAIN (ICD-786.50) >> most of it is AtypCWP related to her FM... Hx of ATRIAL SEPTAL DEFECT (ICD-745.5) - S/P ASD repair 1985> She is followed by DrStuckey & DrKlein... SA NODE DYSFUNCTION >> ~  EKG shows junctional bradycardia & NSSTTWA... ~  2DEcho 7/06 showed tr MR & TR, normal LVF... ~  Followed by DrKlein  for hx SA node dysfunction/ junctional rhythm in the setting of prev ASD repair- doing OK/ stable rhythm/ good exerc capacity. ~  She saw DrKlein 9/11 for CP & Bradycardia (junctional brady)- not on BP/vasoactive meds, heart rate in the 40's> she tells me they are trying to hold off on pacemaker as long as poss. ~  Myoview 10/11 was abnormal w/ mild revers defect in ant wall, mild LV dil & EF= 52%... ~  persistant vs recurrent CP 3/12 lead to Cath by DrStuckey> norm coronaries w/ min luminal irregularities, well preserved LVF... ~  2DEcho 4/12 showed norm LV size & funct w/ EF=55-60%,  no regional wall motion abn, mildMR, trivAI, +atrial septal aneurysm ~  9/12: f/u DrKlein & stable, no changes made...  HYPOTHYROIDISM (ICD-244.9) - on SYNTHROID 80mcg/d> prev on Synthroid 22mcg/d per DrKerr, but she was out of meds for over a yr when she returned here 9/11... ~  TSH here in 2007 = 1.32 ~  9/09: note from DrKerr reviewed- she had been off Synthroid & TSH was 13.14 w/ Synthroid restarted. ~  labs here 9/11 off meds showed TSH= 7.79... rec> start SYNTHROID 91mcg/d. ~  labs 11/11 on Levo75 showed TSH= 4.67 ~  Labs 3/12 on Levo75 showed TSH= 1.53  GERD (ICD-530.81) - on OMEPRAZOLE 40mg Bid IRRITABLE BOWEL SYNDROME (ICD-564.1)   << GI - followed by DrWalker in [place]  >> CONSTIPATION (ICD-564.00)  HEMATURIA UNSPECIFIED (ICD-599.70) - she developed gross hematuria 9/11- neg culture; f/u showed microhematuria & referred to Urology;  [facility]/11 & his UA was neg- Renal Sonar neg for any lesions or stones & Cysto was neg as well (DrNesi continues to follow pt).  RHEUMATOID ARTHRITIS (ICD-714.0)   <<  Rheum -  followed by DrDeveshwar  >> FIBROMYALGIA (ICD-729.1)                  prev Rx> Pred, MTX, Humira, Enbrel - off all meds since 2010 LOW BACK PAIN (ICD-724.2) ~  she has a hx of RA and Fibromyalgia and was followed by DrDeveshwar for Rheumatology- prev on Prednisone, Plaquenil, Lyrica, Imipramine,  Flexeril, Tramadol, and Vicodin...  HEADACHES >> Hx migraine HAs and mixed muscle contraction HAs in the past;  Treated by DrFreeman's [facility] w/ TOPAMAX 150mg /d & Zanaflex 4mg  Tid prn;  He has also given her shots in the neck which really helped in the past... ~  9/12: pt reports f/u eval DrFreeman w/ trigger point injections and incr topamax to 150mg /d; she states improved...  ANXIETY (ICD-300.00) - not currently taking medication.  ANEMIA (ICD-285.9) ~  labs 7/09 from [place] showed Hg= 12.4 ~  labs 9/11 here showed Hg= 13.0 ~  Labs 3/12 showed Hg= 12.8  SEBACEOUS CYST, SCALP (ICD-706.2) - this resolved w/ Keflex... she has a hx of MRSA exposure from her child and had a MRSA buttock abscess drained by Trecia Rogers 9/08...   Past Surgical History  Procedure Date  . Tubal ligation   . Asd repair     Outpatient Encounter Prescriptions as of 03/17/2011  Medication Sig Dispense Refill  . albuterol (PROAIR HFA) 108 (90 BASE) MCG/ACT inhaler Inhale 2 puffs into the lungs every 6 (six) hours as needed.        . cetirizine (ZYRTEC) 10 MG chewable tablet Chew 10 mg by mouth daily.        . Cholecalciferol (VITAMIN D) 1000 UNITS capsule Take 1,000 Units by mouth daily.        Marland Kitchen ibuprofen (ADVIL,MOTRIN) 200 MG tablet Take 200 mg by mouth every 6 (six) hours as needed.        Marland Kitchen levothyroxine (SYNTHROID, LEVOTHROID) 75 MCG tablet Take 75 mcg by mouth daily.       . Multiple Vitamins-Minerals (CENTRUM PO) 1 tab po qd       . omeprazole (PRILOSEC) 40 MG capsule Take 40 mg by mouth 2 (two) times daily.        . polyethylene glycol powder (GLYCOLAX/MIRALAX) powder as directed.      . pseudoephedrine-guaifenesin (MUCINEX D) 60-600 MG per tablet Take 2 tablets by mouth every 12 (twelve) hours.        Marland Kitchen  sodium chloride (OCEAN) 0.65 % nasal spray Place 2 sprays into the nose as needed.        Marland Kitchen tiZANidine (ZANAFLEX) 4 MG tablet Take 4 mg by mouth every 8 (eight) hours as needed. DO NOT EXCEED 3 DOSES IN A  24 HOUR PERIOD       . topiramate (TOPAMAX) 100 MG tablet Take 1 1/2 tablet by mouth daily       . DISCONTD: predniSONE (DELTASONE) 20 MG tablet Take 1 tab  Bid x 3 days, 1 tab daily x 3 days, 1/2 tab  Daily until gone  15 tablet  0  . DISCONTD: topiramate (TOPAMAX) 100 MG tablet Take 100 mg by mouth daily.          No Known Allergies   Current Medications, Allergies, Past Medical History, Past Surgical History, Family History, and Social History were reviewed in [employer] record.    Review of Systems         See HPI - all other systems neg except as noted...  The patient complains of intermittent hoarseness, & some aching/ soreness  The patient denies anorexia, fever, weight loss, weight gain, vision loss, decreased hearing, chest pain, syncope, dyspnea on exertion, peripheral edema, prolonged cough, hemoptysis, abdominal pain, melena, hematochezia, severe indigestion/heartburn, hematuria, incontinence, muscle weakness, suspicious skin lesions, transient blindness, difficulty walking, depression, unusual weight change, abnormal bleeding, enlarged lymph nodes, and angioedema.     Objective:   Physical Exam      WD, Thin, [name] in NAD... GENERAL:  Alert & oriented; pleasant & cooperative... HEENT:  [place]/AT, EOM-wnl, PERRLA, EACs-clear, TMs-wnl, NOSE- congested, clear discharge; THROAT-clear & wnl. NECK: supple w/ mild decrROM; no JVD; normal carotid impulses w/o bruits; no thyromegaly or nodules palpated; no lymphadenopathy. CHEST:  Clear to P & A; without wheezes/ rales/ or rhonchi. HEART:  Reg bradycardia;  gr 1 SEM without rubs or gallops... ABDOMEN:  Soft & nontender; normal bowel sounds; no organomegaly or masses detected. EXT: without deformities, mild arthritic changes; no varicose veins/ venous insuffic/ or edema. NEURO:  CNs intact; no focal neuro deficits... +trigger points esp in trapezius area... DERM:  No lesions noted; no rash  etc...   Assessment & Plan:   Hx AR & Asthma>  Stable on antihist, saline, mucinex, FLONASE, & PROAIR prn (also on Omep40Bid & reflux regimen)...  HBP>  Controlled on low salt diet, not on meds; BP looks good, continue to monitor...  CP (non-cardiac), s/p ASD repair, junctional bradycardia>  Followed by Drs Riley Kill & Graciela Husbands- their notes are reviewed...  Note that her FLP remains normal on diet alone...  HYPOTHYROID>  Stable clinically & biochemically euthyroid on the dose...  GI> GERD, IBS, Constip>  Followed by DrWalker in [place]...  GU> hx hematuria w/ neg eval>  Followed by Thermon Leyland...  Rheum> RA, FM, LBP>  She has been followed by DrDeveshwar in the past (see above)...  Headaches>  Followed by DrFreeman & improved after trigger point injections 9/12.Marland KitchenMarland Kitchen

## 2011-04-07 ENCOUNTER — Other Ambulatory Visit: Payer: Self-pay | Admitting: Pulmonary Disease

## 2011-04-07 ENCOUNTER — Telehealth: Payer: Self-pay | Admitting: Pulmonary Disease

## 2011-04-07 MED ORDER — LEVOTHYROXINE SODIUM 75 MCG PO TABS: 75.0000 ug | ORAL_TABLET | Freq: Every day | ORAL | Status: DC

## 2011-04-07 MED ORDER — OMEPRAZOLE 40 MG PO CPDR: 40.0000 mg | DELAYED_RELEASE_CAPSULE | Freq: Two times a day (BID) | ORAL | Status: DC

## 2011-04-07 NOTE — Telephone Encounter (Signed)
I spoke with pt and she states she needed her levothyroxine and omeprazole faxed to [facility]. I advised pt will send rx

## 2011-04-08 ENCOUNTER — Other Ambulatory Visit: Payer: Self-pay | Admitting: Pulmonary Disease

## 2011-04-10 ENCOUNTER — Ambulatory Visit: Payer: BC Managed Care – PPO | Admitting: Pulmonary Disease

## 2011-04-10 ENCOUNTER — Other Ambulatory Visit (INDEPENDENT_AMBULATORY_CARE_PROVIDER_SITE_OTHER): Payer: BC Managed Care – PPO

## 2011-04-10 DIAGNOSIS — Q2111 Secundum atrial septal defect: Secondary | ICD-10-CM

## 2011-04-10 DIAGNOSIS — K219 Gastro-esophageal reflux disease without esophagitis: Secondary | ICD-10-CM

## 2011-04-10 DIAGNOSIS — E039 Hypothyroidism, unspecified: Secondary | ICD-10-CM

## 2011-04-10 DIAGNOSIS — Q211 Atrial septal defect: Secondary | ICD-10-CM

## 2011-04-10 DIAGNOSIS — D649 Anemia, unspecified: Secondary | ICD-10-CM

## 2011-04-10 LAB — HEPATIC FUNCTION PANEL
ALT: 12 U/L (ref 0–35)
AST: 22 U/L (ref 0–37)
Albumin: 3.9 g/dL (ref 3.5–5.2)
Alkaline Phosphatase: 39 U/L (ref 39–117)
Bilirubin, Direct: 0.1 mg/dL (ref 0.0–0.3)
Total Bilirubin: 0.7 mg/dL (ref 0.3–1.2)
Total Protein: 7.7 g/dL (ref 6.0–8.3)

## 2011-04-10 LAB — BASIC METABOLIC PANEL
BUN: 12 mg/dL (ref 6–23)
CO2: 21 mEq/L (ref 19–32)
Calcium: 9.2 mg/dL (ref 8.4–10.5)
Chloride: 112 mEq/L (ref 96–112)
Creatinine, Ser: 0.8 mg/dL (ref 0.4–1.2)
GFR: 94.62 mL/min (ref >60.00)
Glucose, Bld: 84 mg/dL (ref 70–99)
Potassium: 3.7 mEq/L (ref 3.5–5.1)
Sodium: 141 mEq/L (ref 135–145)

## 2011-04-10 LAB — LIPID PANEL
Cholesterol: 141 mg/dL (ref 0–200)
HDL: 56 mg/dL (ref >39.00)
LDL Cholesterol: 75 mg/dL (ref 0–99)
Total CHOL/HDL Ratio: 3
Triglycerides: 49 mg/dL (ref 0.0–149.0)
VLDL: 9.8 mg/dL (ref 0.0–40.0)

## 2011-04-10 LAB — CBC WITH DIFFERENTIAL/PLATELET
Basophils Absolute: 0 10*3/uL (ref 0.0–0.1)
Basophils Relative: 0.9 % (ref 0.0–3.0)
Eosinophils Absolute: 0 10*3/uL (ref 0.0–0.7)
Eosinophils Relative: 1.3 % (ref 0.0–5.0)
HCT: 37.2 % (ref 36.0–46.0)
Hemoglobin: 12 g/dL (ref 12.0–15.0)
Lymphocytes Relative: 45.8 % (ref 12.0–46.0)
Lymphs Abs: 1 10*3/uL (ref 0.7–4.0)
MCHC: 32.3 g/dL (ref 30.0–36.0)
MCV: 88.9 fl (ref 78.0–100.0)
Monocytes Absolute: 0.3 10*3/uL (ref 0.1–1.0)
Monocytes Relative: 15 % — ABNORMAL HIGH (ref 3.0–12.0)
Neutro Abs: 0.8 10*3/uL — ABNORMAL LOW (ref 1.4–7.7)
Neutrophils Relative %: 37 % — ABNORMAL LOW (ref 43.0–77.0)
Platelets: 160 10*3/uL (ref 150.0–400.0)
RBC: 4.18 Mil/uL (ref 3.87–5.11)
RDW: 15.3 % — ABNORMAL HIGH (ref 11.5–14.6)
WBC: 2.2 10*3/uL — ABNORMAL LOW (ref 4.5–10.5)

## 2011-04-10 LAB — TSH: TSH: 4.02 u[IU]/mL (ref 0.35–5.50)

## 2011-04-15 ENCOUNTER — Ambulatory Visit (INDEPENDENT_AMBULATORY_CARE_PROVIDER_SITE_OTHER): Payer: BC Managed Care – PPO | Admitting: Pulmonary Disease

## 2011-04-15 ENCOUNTER — Ambulatory Visit (INDEPENDENT_AMBULATORY_CARE_PROVIDER_SITE_OTHER)
Admission: RE | Admit: 2011-04-15 | Discharge: 2011-04-15 | Disposition: A | Discharge status: Home / Self Care | Payer: BC Managed Care – PPO | Source: Ambulatory Visit | Attending: Pulmonary Disease | Admitting: Pulmonary Disease

## 2011-04-15 DIAGNOSIS — J45909 Unspecified asthma, uncomplicated: Secondary | ICD-10-CM

## 2011-04-15 DIAGNOSIS — E039 Hypothyroidism, unspecified: Secondary | ICD-10-CM

## 2011-04-15 DIAGNOSIS — F411 Generalized anxiety disorder: Secondary | ICD-10-CM

## 2011-04-15 DIAGNOSIS — K219 Gastro-esophageal reflux disease without esophagitis: Secondary | ICD-10-CM

## 2011-04-15 DIAGNOSIS — IMO0001 Reserved for inherently not codable concepts without codable children: Secondary | ICD-10-CM

## 2011-04-15 DIAGNOSIS — M069 Rheumatoid arthritis, unspecified: Secondary | ICD-10-CM

## 2011-04-15 DIAGNOSIS — D649 Anemia, unspecified: Secondary | ICD-10-CM

## 2011-04-15 DIAGNOSIS — I10 Essential (primary) hypertension: Secondary | ICD-10-CM

## 2011-04-15 NOTE — Patient Instructions (Signed)
Today we updated your med list in our EPIC system...    Continue your current medications the same...  Today we did your follow up CXR...    Please call the PHONE TREE in a few days for your results...    Dial [id number] & when prompted enter your patient number followed by the # symbol...    Your patient number is:  [id number]#  Call for any questions...  Let's plan a routine follow up in 4-6 months.Marland KitchenMarland Kitchen

## 2011-04-27 ENCOUNTER — Emergency Department (HOSPITAL_COMMUNITY)
Admission: EM | Admit: 2011-04-27 | Discharge: 2011-04-27 | Disposition: A | Discharge status: Home / Self Care | Payer: BC Managed Care – PPO | Source: Home / Self Care | Attending: Family Medicine | Admitting: Family Medicine

## 2011-04-27 ENCOUNTER — Encounter (HOSPITAL_COMMUNITY): Payer: Self-pay

## 2011-04-27 ENCOUNTER — Telehealth: Payer: Self-pay | Admitting: Pulmonary Disease

## 2011-04-27 ENCOUNTER — Emergency Department (INDEPENDENT_AMBULATORY_CARE_PROVIDER_SITE_OTHER): Payer: BC Managed Care – PPO

## 2011-04-27 DIAGNOSIS — J069 Acute upper respiratory infection, unspecified: Secondary | ICD-10-CM

## 2011-04-27 MED ORDER — HYDROCOD POLST-CHLORPHEN POLST 10-8 MG/5ML PO LQCR: 5.0000 mL | Freq: Two times a day (BID) | ORAL | Status: DC

## 2011-04-27 MED ORDER — AZITHROMYCIN 250 MG PO TABS: ORAL_TABLET | ORAL | Status: AC

## 2011-04-27 NOTE — ED Notes (Signed)
History of "fluid on my lungs", feels the same; c/o cough w yellow secretions

## 2011-04-27 NOTE — ED Provider Notes (Signed)
History     CSN: [id number] Arrival date & time: 04/27/2011  6:53 PM   First MD Initiated Contact with Patient 04/27/11 1725      Chief Complaint  Patient presents with  . Cough    (Consider location/radiation/quality/duration/timing/severity/associated sxs/prior treatment) Patient is a 45 y.o. female presenting with cough. The history is provided by the patient.  Cough This is a new problem. The current episode started yesterday. The problem occurs constantly. The problem has been gradually worsening. The cough is productive of purulent sputum. There has been no fever. Associated symptoms include shortness of breath. Pertinent negatives include no chest pain, no rhinorrhea and no wheezing. Associated symptoms comments: Coughing yellow phlegm.. She is not a smoker. Past medical history comments: h/o asd, chf, mr, bradycardia synd..    Past Medical History  Diagnosis Date  . Allergic rhinitis, cause unspecified   . Asthma   . Chest pain, unspecified     cardiac cath 07/31/10: normal coronaries; vigorous LVF  . Atrial septal defect     s/p repair;   Echo 08/07/10: EF 55-60%; mild MR; atrial septal aneurysm; no residual ASD  . Sinoatrial node dysfunction     eval. for chronotropic competence completed in past  . Hypothyroidism   . Esophageal reflux   . Irritable bowel syndrome   . Hematuria, unspecified   . Rheumatoid arthritis   . Fibromyalgia   . Anxiety   . Anemia   . Sebaceous cyst   . Hypertension   . Low back pain   . Headache     Past Surgical History  Procedure Date  . Tubal ligation   . Asd repair     History reviewed. No pertinent family history.  History  Substance Use Topics  . Smoking status: Never Smoker   . Smokeless tobacco: Not on file  . Alcohol Use: No    OB History    Grav Para Term Preterm Abortions TAB SAB Ect Mult Living                  Review of Systems  Constitutional: Negative.   HENT: Negative for congestion, rhinorrhea and  postnasal drip.   Respiratory: Positive for cough and shortness of breath. Negative for wheezing.   Cardiovascular: Negative for chest pain.  Gastrointestinal: Negative.   Skin: Negative.     Allergies  Review of patient's allergies indicates no known allergies.  Home Medications   Current Outpatient Rx  Name Route Sig Dispense Refill  . ALBUTEROL SULFATE HFA 108 (90 BASE) MCG/ACT IN AERS Inhalation Inhale 2 puffs into the lungs every 6 (six) hours as needed.      . AZITHROMYCIN 250 MG PO TABS  Take as directed on pack 6 each 0  . CETIRIZINE HCL 10 MG PO CHEW Oral Chew 10 mg by mouth daily.      Marland Kitchen HYDROCOD POLST-CHLORPHEN POLST 10-8 MG/5ML PO LQCR Oral Take 5 mLs by mouth every 12 (twelve) hours. 115 mL 0  . VITAMIN D 1000 UNITS PO CAPS Oral Take 1,000 Units by mouth daily.      . IBUPROFEN 200 MG PO TABS Oral Take 200 mg by mouth every 6 (six) hours as needed.      Marland Kitchen LEVOTHYROXINE SODIUM 75 MCG PO TABS  TAKE 1 TABLET EVERY DAY 30 tablet 0  . CENTRUM PO  1 tab po qd     . OMEPRAZOLE 40 MG PO CPDR Oral Take 1 capsule (40 mg total) by mouth 2 (  two) times daily. 180 capsule 3  . POLYETHYLENE GLYCOL 3350 PO POWD  as directed.    Marland Kitchen PSEUDOEPHEDRINE-GUAIFENESIN 60-600 MG PO TB12 Oral Take 2 tablets by mouth every 12 (twelve) hours.      Marland Kitchen SALINE NASAL SPRAY 0.65 % NA SOLN Nasal Place 2 sprays into the nose as needed.      Marland Kitchen TIZANIDINE HCL 4 MG PO TABS Oral Take 4 mg by mouth every 8 (eight) hours as needed. DO NOT EXCEED 3 DOSES IN A 24 HOUR PERIOD     . TOPIRAMATE 100 MG PO TABS  Take 1 1/2 tablet by mouth daily       BP 147/79  Pulse 60  Temp(Src) 100.8 F (38.2 C) (Oral)  Resp 18  SpO2 100%  Physical Exam  Nursing note and vitals reviewed. Constitutional: She appears well-developed and well-nourished.  HENT:  Head: Normocephalic.  Right Ear: External ear normal.  Left Ear: External ear normal.  Nose: Nose normal.  Mouth/Throat: Oropharynx is clear and moist.  Cardiovascular:  Regular rhythm and intact distal pulses.  Bradycardia present.  Exam reveals no gallop and no friction rub.   Murmur heard.  Systolic murmur is present with a grade of 1/6  Pulmonary/Chest: Effort normal and breath sounds normal.  Musculoskeletal: She exhibits no edema.  Skin: Skin is warm and dry.    ED Course  Procedures (including critical care time)  Labs Reviewed - No data to display Dg Chest 2 View  04/27/2011  *RADIOLOGY REPORT*  Clinical Data: Cough.  Fever.  CHEST - 2 VIEW  Comparison: 04/15/2011 and 12/19/2007.  Findings:  Scoliosis.  Post mediastinotomy.  Mild distortion of the mediastinum unchanged.  No infiltrate, congestive heart failure or pneumothorax.  IMPRESSION: No acute abnormality.  Please see above.  Original Report Authenticated By: Fuller Canada, M.D.     1. Upper respiratory infection       MDM  X-rays reviewed and report per radiologist.         Barkley Bruns, MD 04/27/11 2059

## 2011-04-27 NOTE — Telephone Encounter (Signed)
LMTCB

## 2011-04-28 NOTE — Telephone Encounter (Signed)
Pt states she went to UC last night and was started on Zpak and given Tussinex for her cough. She will call if her sxs do not improve or seek emergency help if sxs get worse. She said nothing further was needed from our office at this time.

## 2011-05-13 ENCOUNTER — Encounter: Payer: Self-pay | Admitting: Pulmonary Disease

## 2011-05-13 ENCOUNTER — Encounter (HOSPITAL_BASED_OUTPATIENT_CLINIC_OR_DEPARTMENT_OTHER): Payer: Self-pay | Admitting: *Deleted

## 2011-05-13 ENCOUNTER — Emergency Department (HOSPITAL_BASED_OUTPATIENT_CLINIC_OR_DEPARTMENT_OTHER)
Admission: EM | Admit: 2011-05-13 | Discharge: 2011-05-13 | Disposition: A | Discharge status: Home / Self Care | Payer: BC Managed Care – PPO | Attending: Emergency Medicine | Admitting: Emergency Medicine

## 2011-05-13 ENCOUNTER — Emergency Department (INDEPENDENT_AMBULATORY_CARE_PROVIDER_SITE_OTHER): Payer: BC Managed Care – PPO

## 2011-05-13 DIAGNOSIS — M549 Dorsalgia, unspecified: Secondary | ICD-10-CM

## 2011-05-13 DIAGNOSIS — I10 Essential (primary) hypertension: Secondary | ICD-10-CM | POA: Insufficient documentation

## 2011-05-13 DIAGNOSIS — Z79899 Other long term (current) drug therapy: Secondary | ICD-10-CM | POA: Insufficient documentation

## 2011-05-13 DIAGNOSIS — K589 Irritable bowel syndrome without diarrhea: Secondary | ICD-10-CM | POA: Insufficient documentation

## 2011-05-13 DIAGNOSIS — R079 Chest pain, unspecified: Secondary | ICD-10-CM

## 2011-05-13 DIAGNOSIS — K219 Gastro-esophageal reflux disease without esophagitis: Secondary | ICD-10-CM | POA: Insufficient documentation

## 2011-05-13 DIAGNOSIS — E039 Hypothyroidism, unspecified: Secondary | ICD-10-CM | POA: Insufficient documentation

## 2011-05-13 DIAGNOSIS — J45909 Unspecified asthma, uncomplicated: Secondary | ICD-10-CM | POA: Insufficient documentation

## 2011-05-13 MED ORDER — KETOROLAC TROMETHAMINE 60 MG/2ML IM SOLN
60.0000 mg | Freq: Once | INTRAMUSCULAR | Status: AC
  Administered 2011-05-13: 60 mg via INTRAMUSCULAR
  Filled 2011-05-13: qty 2

## 2011-05-13 MED ORDER — NAPROXEN 500 MG PO TABS: 500.0000 mg | ORAL_TABLET | Freq: Two times a day (BID) | ORAL | Status: AC

## 2011-05-13 MED ORDER — HYDROCODONE-ACETAMINOPHEN 5-325 MG PO TABS: 1.0000 | ORAL_TABLET | Freq: Four times a day (QID) | ORAL | Status: AC | PRN

## 2011-05-13 MED ORDER — METHOCARBAMOL 500 MG PO TABS: 500.0000 mg | ORAL_TABLET | Freq: Two times a day (BID) | ORAL | Status: AC

## 2011-05-13 MED ORDER — DEXAMETHASONE SODIUM PHOSPHATE 10 MG/ML IJ SOLN
10.0000 mg | Freq: Once | INTRAMUSCULAR | Status: AC
  Administered 2011-05-13: 10 mg via INTRAMUSCULAR
  Filled 2011-05-13: qty 1

## 2011-05-13 NOTE — Progress Notes (Signed)
Subjective:    Patient ID: Brittany Hodges, female    DOB: [birth year], 45 y.o.   MRN: [id number]  HPI 44 y/o BF here for a follow up visit...  she has mult medical problems including AR & Asthma;  HBP;  Hx CP & ASD repair;  Hypothyroid;  GERD/ IBS/ constipation;  hx hematuria evaluated by Thermon Leyland;  RA/ FM/ LBP follwed by DrDeveshwar;  Chronic headaches/ Migraines followed by DrFreeman;  Anxiety...  ~  January 22, 2010:  14 month ROV- she's been OOW x several yrs and unemployment ran out,  just got new job & here to re-establish> she is off all prev meds (see below) & only taking MVI, Vit D, & Dexilant samples from [facility]... she was seen by ENT in [place] recently w/ throat symptoms related to reflux & Dexilant started;  also found to have TMJ problem, and c/o sinus congestion> we discussed Saline, Nasonex, Breathe-right nasal strips Qhs... non-fasting labs done today & WNL x TSH=7.79 & Synthroid 58mcg/d started...  ~  March 25, 2010:  she is feeling sl better on the Levothy75 & we will recheck her TSH today (4.67) to adjust as needed;  FLP 10/11 by Cards looked good;  HR is chronically slow (40's) & she denies recurrent CP, not dizzy etc...    She saw DrKlein 9/11for CP & Bradycardia (junctional brady) in the setting of prev ASD repair- not on BP/vasoactive meds, heart rate in the 40's... she had Myoview 10/11 which was abnormal w/ mild revers defect in ant wall, mild LV dil & EF= 52%... she tells me they are trying to hold off on pacemaker.   She also saw DrNesi due to episode of gross then micro-hematuria> his UA was clear & Sonar is planned & pending at this time...   ~  July 23, 2010:  Presents c/o right ear pain, sinus congestion & drainage, no help from OTC Zyrtek;  she saw TP 2/12 w/ LER & Omep40 incr to Bid;  we discussed rx w/ Augmentin, Depo/ Dosepak, Mucinex, Saline nasal sp & Flonase (all symptoms resolved on meds)...    She saw DrNesi 11/11 for f/u hematuria> renal sonar neg for any  lesions or stones, Cysto was neg as well & they plan routine f/u to check for any recurrent blood...    She also had a f/u DrFreeman 2/12 for her Migraines> on Topamax (75mg /d) prevention & Zanaflex (4mg ) as needed;  he did mult trigger point injections which he says was very helpful to her in the past (& they helped again)...  ~  July 24,2012:  Chad had persist CP 3/12 & DrKlein ordered a Cath> see below: cath neg w/ norm LVF & no signif CAD; she has been doing well, exercising daily w/o difficulty, denies palpit/ dizzy/ syncope/ etc... She also had f/u DrNesi last week & reports that her urine was clear, no blood...  We reviewed her interim cardiac visits, tests, & labs...  Her CC= some diffuse arhtritic pain & she uses OTC Ibuprofen Prn...  ~  January 22, 2011:  6wk ROV & add-on for sinus pressure & headache all over but the back-story is very interesting> she has had 2 ER visits ([facility] & [place]), mult phone calls w/ meds called-in & visit w/ [place] [facility] & CT sinus done yest> all for the same symptoms of sinus pressure, drainage, & HA all over ("even my eyes & teeth hurt"); recall hx chronic headaches managed by DrFreeman's clinic on Topamax &  Zanaflex but "shots in neck" helped the most she said;  Over the last month she has had several antibiotics, pred, pain meds, & nausea med (she didn't bring any list or bottles)...  CT Sinus done 01/21/11 at [facility] was essentially neg w/o acute sinusitis & min chronic changes...  She desperately wants treatment & we agreed to prescribe Avelox, Depo80, Pred taper, Mucinex, Saline, Vicodin> but she really needs to see DrFreeman for HA clinic follow up... See prob list below:  ~  March 17, 2011:  6wk ROV & she reports f/u w/ DrFreeman [facility] (note pending) dx as migraines & related to her hormones & the weather she says; they inr her Topamax to 150mg /d & injected some trigger points- she feels better;  She finished the sinusitis Rx & improved as well-  she is asked to f/u w/ her ENT for any further sinus problems;  She saw DrKlein 9/12 for f/u of a junctional rhythm in the setting of her ASD repair- SAnode dysfunction w/ [name], she is asymptomatic & exercise heart rate is adeq, atypCP w/ non-ischemic myoview 2011 & neg cath 11/2010> no changes made...  See prob list below:  ~  April 15, 2011:  78mo ROV & she wants to go over her blood work (FLP-wnl, [place]-wnl, CBCw/ WBC=2.2 nl diff, TSH-wnl) & get a CXR today (clear, wnl); she has no other complaints at this time!!!          Problem List:   As noted> she has mult specialists tending to ALL her medical problems...  ALLERGIC RHINITIS (ICD-477.9) - on OTC Antihist (eg- ZYRTEK) Qam; Saline nasal sp every 1-2 H during the day + MUCINEX 2Bid; & FLONASE 2spQhs... rec to use breathe right nasal strips at bedtime Prn to help w/ drainage... she knows to avoid Afrin due to BP. ~  9/11:  She reports that recent ENT eval in [place] showed reflux related ENT symptoms & Rx w/ Dexilant> ch to OMEPRAZOLE 40mg Bid. ~  9/12:  She reports recent ENT eval in [place] for "sinus pressure" & CT scan showed essentially neg w/o acute sinusitis & min chronic changes...  ASTHMA (ICD-493.90) -  uses PROAIR as needed... denies cough, sputum, hemoptysis, worsening dyspnea, wheezing, chest pains, snoring, daytime hypersomnolence, etc... On the OMEP Bid & antireflux regimen to help nocturnal symptoms... ~  CXR 8/09 showed prev median sternotomy, heart norm size, lungs clear, +scoliosis... ~  CXR 11/12 showed heart size normal, lungs clear, mils scoliosis, NAD...  HYPERTENSION (ICD-401.9) - controlled on low salt diet alone (prev on Norvasc & avoiding meds due to her bradycardia)...  ~  9/12: BP= 110/62 today & doing satis w/o medication; denies visual changes, CP, palipit, dizziness, syncope, dyspnea, edema, etc...  ~  10/12: BP= 118/78 & stable- remains asymptomatic x for her mult somatic complaints...  Hx of CHEST PAIN (ICD-786.50) >>  most of it is AtypCWP related to her FM... Hx of ATRIAL SEPTAL DEFECT (ICD-745.5) - S/P ASD repair 1985> She is followed by DrStuckey & DrKlein... SA NODE DYSFUNCTION >> ~  EKG shows junctional bradycardia & NSSTTWA... ~  2DEcho 7/06 showed tr MR & TR, normal LVF... ~  Followed by DrKlein for hx SA node dysfunction/ junctional rhythm in the setting of prev ASD repair- doing OK/ stable rhythm/ good exerc capacity. ~  She saw DrKlein 9/11 for CP & Bradycardia (junctional brady)- not on BP/vasoactive meds, heart rate in the 40's> she tells me they are trying to hold off on  pacemaker as long as poss. ~  Myoview 10/11 was abnormal w/ mild revers defect in ant wall, mild LV dil & EF= 52%... ~  persistant vs recurrent CP 3/12 lead to Cath by DrStuckey> norm coronaries w/ min luminal irregularities, well preserved LVF... ~  2DEcho 4/12 showed norm LV size & funct w/ EF=55-60%, no regional wall motion abn, mildMR, trivAI, +atrial septal aneurysm ~  9/12: f/u DrKlein & stable, no changes made...  HYPOTHYROIDISM (ICD-244.9) - on SYNTHROID 38mcg/d> prev on Synthroid 60mcg/d per DrKerr, but she was out of meds for over a yr when she returned here 9/11... ~  TSH here in 2007 = 1.32 ~  9/09: note from DrKerr reviewed- she had been off Synthroid & TSH was 13.14 w/ Synthroid restarted. ~  labs here 9/11 off meds showed TSH= 7.79... rec> start SYNTHROID 37mcg/d. ~  labs 11/11 on Levo75 showed TSH= 4.67 ~  Labs 3/12 on Levo75 showed TSH= 1.53 ~  Labs 11/12 on Levo75 showed TSH= 4.02  GERD (ICD-530.81) - on OMEPRAZOLE 40mg Bid IRRITABLE BOWEL SYNDROME (ICD-564.1)   << GI - followed by DrWalker in [place]  >> CONSTIPATION (ICD-564.00)  HEMATURIA UNSPECIFIED (ICD-599.70) - she developed gross hematuria 9/11- neg culture; f/u showed microhematuria & referred to Urology;  seen by [facility] 10/11 & his UA was neg- Renal Sonar neg for any lesions or stones & Cysto was neg as well (DrNesi continues to follow pt).  RHEUMATOID  ARTHRITIS (ICD-714.0)   <<  Rheum -  followed by DrDeveshwar  >> FIBROMYALGIA (ICD-729.1)                  prev Rx> Pred, MTX, Humira, Enbrel - off all meds since 2010 LOW BACK PAIN (ICD-724.2) ~  she has a hx of RA and Fibromyalgia and was followed by DrDeveshwar for Rheumatology- prev on Prednisone, Plaquenil, Lyrica, Imipramine, Flexeril, Tramadol, and Vicodin...  HEADACHES >> Hx migraine HAs and mixed muscle contraction HAs in the past;  Treated by DrFreeman's [facility] w/ TOPAMAX 150mg /d & Zanaflex 4mg  Tid prn;  He has also given her shots in the neck which really helped in the past... ~  9/12: pt reports f/u eval DrFreeman w/ trigger point injections and incr Topamax to 150mg /d; she states improved...  ANXIETY (ICD-300.00) - not currently taking medication.  ANEMIA (ICD-285.9) ~  labs 7/09 from [place] showed Hg= 12.4 ~  labs 9/11 here showed Hg= 13.0 ~  Labs 3/12 showed Hg= 12.8  SEBACEOUS CYST, SCALP (ICD-706.2) - this resolved w/ Keflex... she has a hx of MRSA exposure from her child and had a MRSA buttock abscess drained by Trecia Rogers 9/08...   Past Surgical History  Procedure Date  . Tubal ligation   . Asd repair     Outpatient Encounter Prescriptions as of 04/15/2011  Medication Sig Dispense Refill  . albuterol (PROAIR HFA) 108 (90 BASE) MCG/ACT inhaler Inhale 2 puffs into the lungs every 6 (six) hours as needed.        . cetirizine (ZYRTEC) 10 MG chewable tablet Chew 10 mg by mouth daily.        . Cholecalciferol (VITAMIN D) 1000 UNITS capsule Take 1,000 Units by mouth daily.        Marland Kitchen ibuprofen (ADVIL,MOTRIN) 200 MG tablet Take 200 mg by mouth every 6 (six) hours as needed.        Marland Kitchen levothyroxine (SYNTHROID, LEVOTHROID) 75 MCG tablet TAKE 1 TABLET EVERY DAY  30 tablet  0  . Multiple Vitamins-Minerals (  CENTRUM PO) 1 tab po qd       . omeprazole (PRILOSEC) 40 MG capsule Take 1 capsule (40 mg total) by mouth 2 (two) times daily.  180 capsule  3  . polyethylene glycol powder  (GLYCOLAX/MIRALAX) powder as directed.      . pseudoephedrine-guaifenesin (MUCINEX D) 60-600 MG per tablet Take 2 tablets by mouth every 12 (twelve) hours.        . sodium chloride (OCEAN) 0.65 % nasal spray Place 2 sprays into the nose as needed.        Marland Kitchen tiZANidine (ZANAFLEX) 4 MG tablet Take 4 mg by mouth every 8 (eight) hours as needed. DO NOT EXCEED 3 DOSES IN A 24 HOUR PERIOD       . topiramate (TOPAMAX) 100 MG tablet Take 1 1/2 tablet by mouth daily         No Known Allergies   Current Medications, Allergies, Past Medical History, Past Surgical History, Family History, and Social History were reviewed in [employer] record.    Review of Systems         See HPI - all other systems neg except as noted...  The patient complains of intermittent hoarseness, & some aching/ soreness  The patient denies anorexia, fever, weight loss, weight gain, vision loss, decreased hearing, chest pain, syncope, dyspnea on exertion, peripheral edema, prolonged cough, hemoptysis, abdominal pain, melena, hematochezia, severe indigestion/heartburn, hematuria, incontinence, muscle weakness, suspicious skin lesions, transient blindness, difficulty walking, depression, unusual weight change, abnormal bleeding, enlarged lymph nodes, and angioedema.     Objective:   Physical Exam      WD, Thin, [name] in NAD... GENERAL:  Alert & oriented; pleasant & cooperative... HEENT:  [place]/AT, EOM-wnl, PERRLA, EACs-clear, TMs-wnl, NOSE- congested, clear discharge; THROAT-clear & wnl. NECK: supple w/ mild decrROM; no JVD; normal carotid impulses w/o bruits; no thyromegaly or nodules palpated; no lymphadenopathy. CHEST:  Clear to P & A; without wheezes/ rales/ or rhonchi. HEART:  Reg bradycardia;  gr 1 SEM without rubs or gallops... ABDOMEN:  Soft & nontender; normal bowel sounds; no organomegaly or masses detected. EXT: without deformities, mild arthritic changes; no varicose veins/ venous insuffic/  or edema. NEURO:  CNs intact; no focal neuro deficits... +trigger points esp in trapezius area... DERM:  No lesions noted; no rash etc...   RADIOLOGY DATA:  Reviewed in the EPIC EMR & discussed w/ the patient...  LABORATORY DATA:  Reviewed in the EPIC EMR & discussed w/ the patient...   Assessment & Plan:   Hx AR & Asthma>  Stable on antihist, saline, mucinex, FLONASE, & PROAIR prn (also on Omep40Bid & reflux regimen)...  HBP>  Controlled on low salt diet, not on meds; BP looks good, continue to monitor...  CP (non-cardiac), s/p ASD repair, junctional bradycardia>  Followed by Drs Riley Kill & Graciela Husbands- their notes are reviewed...  Note that her FLP remains normal on diet alone...  HYPOTHYROID>  Stable clinically & biochemically euthyroid on the dose...  GI> GERD, IBS, Constip>  Followed by DrWalker in [place]...  GU> hx hematuria w/ neg eval>  Followed by Thermon Leyland...  Rheum> RA, FM, LBP>  She has been followed by DrDeveshwar in the past (see above)...  Headaches>  Followed by DrFreeman & improved after trigger point injections 9/12...   Patient's Medications  New Prescriptions   CHLORPHENIRAMINE-HYDROCODONE (TUSSIONEX PENNKINETIC ER) 10-8 MG/5ML LQCR    Take 5 mLs by mouth every 12 (twelve) hours.  Previous Medications  ALBUTEROL (PROAIR HFA) 108 (90 BASE) MCG/ACT INHALER    Inhale 2 puffs into the lungs every 6 (six) hours as needed.     CETIRIZINE (ZYRTEC) 10 MG CHEWABLE TABLET    Chew 10 mg by mouth daily.     CHOLECALCIFEROL (VITAMIN D) 1000 UNITS CAPSULE    Take 1,000 Units by mouth daily.     IBUPROFEN (ADVIL,MOTRIN) 200 MG TABLET    Take 200 mg by mouth every 6 (six) hours as needed.     LEVOTHYROXINE (SYNTHROID, LEVOTHROID) 75 MCG TABLET    TAKE 1 TABLET EVERY DAY   MULTIPLE VITAMINS-MINERALS (CENTRUM PO)    1 tab po qd    OMEPRAZOLE (PRILOSEC) 40 MG CAPSULE    Take 1 capsule (40 mg total) by mouth 2 (two) times daily.   POLYETHYLENE GLYCOL POWDER (GLYCOLAX/MIRALAX)  POWDER    as directed.   PSEUDOEPHEDRINE-GUAIFENESIN (MUCINEX D) 60-600 MG PER TABLET    Take 2 tablets by mouth every 12 (twelve) hours.     SODIUM CHLORIDE (OCEAN) 0.65 % NASAL SPRAY    Place 2 sprays into the nose as needed.     TIZANIDINE (ZANAFLEX) 4 MG TABLET    Take 4 mg by mouth every 8 (eight) hours as needed. DO NOT EXCEED 3 DOSES IN A 24 HOUR PERIOD    TOPIRAMATE (TOPAMAX) 100 MG TABLET    Take 1 1/2 tablet by mouth daily   Modified Medications   No medications on file  Discontinued Medications   No medications on file

## 2011-05-13 NOTE — ED Notes (Signed)
Pt c/o left lower back pain x 2 days 

## 2011-05-13 NOTE — ED Provider Notes (Signed)
Medical screening examination/treatment/procedure(s) were performed by non-physician practitioner and as supervising physician I was immediately available for consultation/collaboration.  Doug Sou, MD 05/13/11 [phone]

## 2011-05-13 NOTE — ED Provider Notes (Signed)
History     CSN: [id number]  Arrival date & time 05/13/11  1823   First MD Initiated Contact with Patient 05/13/11 1843     HPI Patient reports for last 2 days had pain of her right upper back that radiates around to her right anterior chest. Reports pain has gradually worsened. States pain is worse with coughing and deep breathing as well as with palpation. States she recently got over an upper respiratory tract infection and for the most part her cough has resolved. Denies known twisting injury. Denies shortness of breath, palpitations, tachycardia. Patient is a 45 y.o. female presenting with back pain. The history is provided by the patient.  Back Pain  The current episode started 2 days ago. The problem occurs constantly. The problem has not changed since onset.The pain is associated with no known injury. The quality of the pain is described as stabbing. Radiates to: Around to right anterior chest. The pain is moderate. The symptoms are aggravated by certain positions (Palpation). Associated symptoms include chest pain. Pertinent negatives include no fever, no numbness, no headaches, no abdominal pain, no bowel incontinence, no perianal numbness, no dysuria, no pelvic pain, no leg pain, no paresthesias, no paresis, no tingling and no weakness. Associated symptoms comments: Not associated with shortness of breath.. She has tried nothing for the symptoms. The treatment provided no relief.    Past Medical History  Diagnosis Date  . Allergic rhinitis, cause unspecified   . Asthma   . Chest pain, unspecified     cardiac cath 07/31/10: normal coronaries; vigorous LVF  . Atrial septal defect     s/p repair;   Echo 08/07/10: EF 55-60%; mild MR; atrial septal aneurysm; no residual ASD  . Sinoatrial node dysfunction     eval. for chronotropic competence completed in past  . Hypothyroidism   . Esophageal reflux   . Irritable bowel syndrome   . Hematuria, unspecified   . Rheumatoid arthritis   .  Fibromyalgia   . Anxiety   . Anemia   . Sebaceous cyst   . Hypertension   . Low back pain   . Headache     Past Surgical History  Procedure Date  . Tubal ligation   . Asd repair     History reviewed. No pertinent family history.  History  Substance Use Topics  . Smoking status: Never Smoker   . Smokeless tobacco: Not on file  . Alcohol Use: No    OB History    Grav Para Term Preterm Abortions TAB SAB Ect Mult Living                  Review of Systems  Constitutional: Negative for fever and chills.  HENT: Negative for neck pain and neck stiffness.   Respiratory: Negative for cough and shortness of breath.   Cardiovascular: Positive for chest pain.  Gastrointestinal: Negative for abdominal pain and bowel incontinence.  Genitourinary: Negative for dysuria, urgency, frequency, hematuria, flank pain and pelvic pain.  Musculoskeletal: Positive for back pain.       Denies saddle anesthesias, or perineal numbness, urinary or bowel incontinence  Neurological: Negative for tingling, weakness, numbness, headaches and paresthesias.    Allergies  Review of patient's allergies indicates no known allergies.  Home Medications   Current Outpatient Rx  Name Route Sig Dispense Refill  . ALBUTEROL SULFATE HFA 108 (90 BASE) MCG/ACT IN AERS Inhalation Inhale 2 puffs into the lungs every 6 (six) hours as needed. For shortness of  breath and wheezing    . CETIRIZINE HCL 10 MG PO CHEW Oral Chew 10 mg by mouth daily.      Marland Kitchen HYDROCOD POLST-CHLORPHEN POLST 10-8 MG/5ML PO LQCR Oral Take 5 mLs by mouth every 12 (twelve) hours. 115 mL 0  . VITAMIN D 1000 UNITS PO CAPS Oral Take 1,000 Units by mouth daily.      Marland Kitchen LEVOTHYROXINE SODIUM 75 MCG PO TABS  TAKE 1 TABLET EVERY DAY 30 tablet 0  . CENTRUM PO Oral Take 1 tablet by mouth daily.     Marland Kitchen OMEPRAZOLE 40 MG PO CPDR Oral Take 1 capsule (40 mg total) by mouth 2 (two) times daily. 180 capsule 3  . POLYETHYLENE GLYCOL 3350 PO POWD  as directed.      Marland Kitchen PSEUDOEPHEDRINE-GUAIFENESIN 60-600 MG PO TB12 Oral Take 1 tablet by mouth every 12 (twelve) hours.     Marland Kitchen SALINE NASAL SPRAY 0.65 % NA SOLN Nasal Place 2 sprays into the nose as needed.      Marland Kitchen TIZANIDINE HCL 4 MG PO TABS Oral Take 4 mg by mouth every 8 (eight) hours as needed. DO NOT EXCEED 3 DOSES IN A 24 HOUR PERIOD     . TOPIRAMATE 100 MG PO TABS Oral Take 150 mg by mouth at bedtime.       BP 158/88  Pulse 45  Temp(Src) 98.2 F (36.8 C) (Oral)  Resp 18  Ht 5\' 8"  (1.727 m)  Wt 136 lb (61.689 kg)  BMI 20.68 kg/m2  SpO2 100%  Physical Exam  Vitals reviewed. Constitutional: She is oriented to person, place, and time. Vital signs are normal. She appears well-developed and well-nourished.  HENT:  Head: Normocephalic and atraumatic.  Eyes: Conjunctivae are normal. Pupils are equal, round, and reactive to light.  Neck: Normal range of motion. Neck supple.  Cardiovascular: Normal rate, regular rhythm and normal heart sounds.  Exam reveals no friction rub.   No murmur heard. Pulmonary/Chest: Effort normal and breath sounds normal. She has no wheezes. She has no rhonchi. She has no rales. She exhibits no tenderness.  Musculoskeletal: Normal range of motion.       Arms:      Tenderness reproduced with palpation inferior to the scapula and around to right anterior chest.  Neurological: She is alert and oriented to person, place, and time. Coordination normal.  Skin: Skin is warm and dry. No rash noted. No erythema. No pallor.    ED Course  Procedures    Dg Chest 2 View  05/13/2011  *RADIOLOGY REPORT*  Clinical Data: Chest/back pain  CHEST - 2 VIEW  Comparison: 04/27/2011  Findings: Lungs are clear. No pleural effusion or pneumothorax.  Cardiomediastinal silhouette is within normal limits.  Median sternotomy.  Mild thoracolumbar scoliosis.  IMPRESSION: No evidence of acute cardiopulmonary disease.  Original Report Authenticated By: Charline Bills, M.D.   MDM   Patient's PERC neg.  symptoms improved after medication. Will discharge with analgesics, anti-inflammatory, and a muscle relaxer. Discussed this with patient. Feel likely patient does have having musculoskeletal back/chest pain due to recent upper respiratory tract infection she's had for last 2-3 weeks. Reports she had a significant cough at the time which has improved. Patient agrees with plan and is ready for discharge. Advised patient to return immediately for worsening symptoms. Patient has a history of bradycardia on several prior visits to the ED up to April. Discussed with patient. Patient is a very small African American female, this may be normal for  her.      Thomasene Lot, [place] 05/13/11 [id number]

## 2011-06-04 ENCOUNTER — Encounter: Payer: Self-pay | Admitting: Adult Health

## 2011-06-04 ENCOUNTER — Ambulatory Visit (INDEPENDENT_AMBULATORY_CARE_PROVIDER_SITE_OTHER): Payer: BC Managed Care – PPO | Admitting: Adult Health

## 2011-06-04 DIAGNOSIS — K219 Gastro-esophageal reflux disease without esophagitis: Secondary | ICD-10-CM

## 2011-06-04 DIAGNOSIS — J309 Allergic rhinitis, unspecified: Secondary | ICD-10-CM

## 2011-06-04 NOTE — Progress Notes (Signed)
Subjective:    Patient ID: Brittany Hodges, female    DOB: 1965-06-13, 46 y.o.   MRN: [id number]  HPI 46 y/o BF -- mult medical problems including AR & Asthma;  HBP;  Hx CP & ASD repair;  Hypothyroid;  GERD/ IBS/ constipation;  hx hematuria evaluated by Thermon Leyland;  RA/ FM/ LBP follwed by DrDeveshwar;  Chronic headaches/ Migraines followed by DrFreeman;  Anxiety...  ~  January 22, 2010:  14 month ROV- she's been OOW x several yrs and unemployment ran out,  just got new job & here to re-establish> she is off all prev meds (see below) & only taking MVI, Vit D, & Dexilant samples from [facility]... she was seen by ENT in [place] recently w/ throat symptoms related to reflux & Dexilant started;  also found to have TMJ problem, and c/o sinus congestion> we discussed Saline, Nasonex, Breathe-right nasal strips Qhs... non-fasting labs done today & WNL x TSH=7.79 & Synthroid 48mcg/d started...  ~  March 25, 2010:  she is feeling sl better on the Levothy75 & we will recheck her TSH today (4.67) to adjust as needed;  FLP 10/11 by Cards looked good;  HR is chronically slow (40's) & she denies recurrent CP, not dizzy etc...    She saw DrKlein 9/11for CP & Bradycardia (junctional brady) in the setting of prev ASD repair- not on BP/vasoactive meds, heart rate in the 40's... she had Myoview 10/11 which was abnormal w/ mild revers defect in ant wall, mild LV dil & EF= 52%... she tells me they are trying to hold off on pacemaker.   She also saw DrNesi due to episode of gross then micro-hematuria> his UA was clear & Sonar is planned & pending at this time...   ~  July 23, 2010:  Presents c/o right ear pain, sinus congestion & drainage, no help from OTC Zyrtek;  she saw TP 2/12 w/ LER & Omep40 incr to Bid;  we discussed rx w/ Augmentin, Depo/ Dosepak, Mucinex, Saline nasal sp & Flonase (all symptoms resolved on meds)...    She saw DrNesi 11/11 for f/u hematuria> renal sonar neg for any lesions or stones, Cysto was neg as  well & they plan routine f/u to check for any recurrent blood...    She also had a f/u DrFreeman 2/12 for her Migraines> on Topamax (75mg /d) prevention & Zanaflex (4mg ) as needed;  he did mult trigger point injections which he says was very helpful to her in the past (& they helped again)...  ~  July 24,2012:  Nela had persist CP 3/12 & DrKlein ordered a Cath> see below: cath neg w/ norm LVF & no signif CAD; she has been doing well, exercising daily w/o difficulty, denies palpit/ dizzy/ syncope/ etc... She also had f/u DrNesi last week & reports that her urine was clear, no blood...  We reviewed her interim cardiac visits, tests, & labs...  Her CC= some diffuse arhtritic pain & she uses OTC Ibuprofen Prn...  ~  January 22, 2011:  6wk ROV & add-on for sinus pressure & headache all over but the back-story is very interesting> she has had 2 ER visits ([facility] & [place]), mult phone calls w/ meds called-in & visit w/ [place] [facility] & CT sinus done yest> all for the same symptoms of sinus pressure, drainage, & HA all over ("even my eyes & teeth hurt"); recall hx chronic headaches managed by DrFreeman's clinic on Topamax & Zanaflex but "shots in neck" helped the most  she said;  Over the last month she has had several antibiotics, pred, pain meds, & nausea med (she didn't bring any list or bottles)...  CT Sinus done 01/21/11 at [facility] was essentially neg w/o acute sinusitis & min chronic changes...  She desperately wants treatment & we agreed to prescribe Avelox, Depo80, Pred taper, Mucinex, Saline, Vicodin> but she really needs to see DrFreeman for HA clinic follow up... See prob list below:  ~  March 17, 2011:  6wk ROV & she reports f/u w/ DrFreeman [facility] (note pending) dx as migraines & related to her hormones & the weather she says; they inr her Topamax to 150mg /d & injected some trigger points- she feels better;  She finished the sinusitis Rx & improved as well- she is asked to f/u w/ her ENT for any  further sinus problems;  She saw DrKlein 9/12 for f/u of a junctional rhythm in the setting of her ASD repair- SAnode dysfunction w/ [name], she is asymptomatic & exercise heart rate is adeq, atypCP w/ non-ischemic myoview 2011 & neg cath 11/2010> no changes made...  See prob list below:  ~  April 15, 2011:  59mo ROV & she wants to go over her blood work (FLP-wnl, [place]-wnl, CBCw/ WBC=2.2 nl diff, TSH-wnl) & get a CXR today (clear, wnl); she has no other complaints at this time!!!  06/04/2011 Acute OV  Complains of tongue itching, sensitivity in lips x1week with dryness and raised bumps on back of tongue over last week. Having some intermittent reflux, and PND. Drainage down throat on/off.  Did better on nexium but insurance will not cover. No dysphagia. No chest pain or cough. No fever.  No otc used. Feels fine. No new meds. No lip swelling or dyspnea.          Problem List:   As noted> she has mult specialists tending to ALL her medical problems...  ALLERGIC RHINITIS (ICD-477.9) - on OTC Antihist (eg- ZYRTEK) Qam; Saline nasal sp every 1-2 H during the day + MUCINEX 2Bid; & FLONASE 2spQhs... rec to use breathe right nasal strips at bedtime Prn to help w/ drainage... she knows to avoid Afrin due to BP. ~  9/11:  She reports that recent ENT eval in [place] showed reflux related ENT symptoms & Rx w/ Dexilant> ch to OMEPRAZOLE 40mg Bid. ~  9/12:  She reports recent ENT eval in [place] for "sinus pressure" & CT scan showed essentially neg w/o acute sinusitis & min chronic changes...  ASTHMA (ICD-493.90) -  uses PROAIR as needed... denies cough, sputum, hemoptysis, worsening dyspnea, wheezing, chest pains, snoring, daytime hypersomnolence, etc... On the OMEP Bid & antireflux regimen to help nocturnal symptoms... ~  CXR 8/09 showed prev median sternotomy, heart norm size, lungs clear, +scoliosis... ~  CXR 11/12 showed heart size normal, lungs clear, mils scoliosis, NAD...  HYPERTENSION (ICD-401.9) - controlled on  low salt diet alone (prev on Norvasc & avoiding meds due to her bradycardia)...  ~  9/12: BP= 110/62 today & doing satis w/o medication; denies visual changes, CP, palipit, dizziness, syncope, dyspnea, edema, etc...  ~  10/12: BP= 118/78 & stable- remains asymptomatic x for her mult somatic complaints...  Hx of CHEST PAIN (ICD-786.50) >> most of it is AtypCWP related to her FM... Hx of ATRIAL SEPTAL DEFECT (ICD-745.5) - S/P ASD repair 1985> She is followed by DrStuckey & DrKlein... SA NODE DYSFUNCTION >> ~  EKG shows junctional bradycardia & NSSTTWA... ~  2DEcho 7/06 showed tr MR &  TR, normal LVF... ~  Followed by DrKlein for hx SA node dysfunction/ junctional rhythm in the setting of prev ASD repair- doing OK/ stable rhythm/ good exerc capacity. ~  She saw DrKlein 9/11 for CP & Bradycardia (junctional brady)- not on BP/vasoactive meds, heart rate in the 40's> she tells me they are trying to hold off on pacemaker as long as poss. ~  Myoview 10/11 was abnormal w/ mild revers defect in ant wall, mild LV dil & EF= 52%... ~  persistant vs recurrent CP 3/12 lead to Cath by DrStuckey> norm coronaries w/ min luminal irregularities, well preserved LVF... ~  2DEcho 4/12 showed norm LV size & funct w/ EF=55-60%, no regional wall motion abn, mildMR, trivAI, +atrial septal aneurysm ~  9/12: f/u DrKlein & stable, no changes made...  HYPOTHYROIDISM (ICD-244.9) - on SYNTHROID 34mcg/d> prev on Synthroid 31mcg/d per DrKerr, but she was out of meds for over a yr when she returned here 9/11... ~  TSH here in 2007 = 1.32 ~  9/09: note from DrKerr reviewed- she had been off Synthroid & TSH was 13.14 w/ Synthroid restarted. ~  labs here 9/11 off meds showed TSH= 7.79... rec> start SYNTHROID 23mcg/d. ~  labs 11/11 on Levo75 showed TSH= 4.67 ~  Labs 3/12 on Levo75 showed TSH= 1.53 ~  Labs 11/12 on Levo75 showed TSH= 4.02  GERD (ICD-530.81) - on OMEPRAZOLE 40mg Bid IRRITABLE BOWEL SYNDROME (ICD-564.1)   << GI -  followed by DrWalker in [place]  >> CONSTIPATION (ICD-564.00)  HEMATURIA UNSPECIFIED (ICD-599.70) - she developed gross hematuria 9/11- neg culture; f/u showed microhematuria & referred to Urology;  [facility]11 & his UA was neg- Renal Sonar neg for any lesions or stones & Cysto was neg as well (DrNesi continues to follow pt).  RHEUMATOID ARTHRITIS (ICD-714.0)   <<  Rheum -  followed by DrDeveshwar  >> FIBROMYALGIA (ICD-729.1)                  prev Rx> Pred, MTX, Humira, Enbrel - off all meds since 2010 LOW BACK PAIN (ICD-724.2) ~  she has a hx of RA and Fibromyalgia and was followed by DrDeveshwar for Rheumatology- prev on Prednisone, Plaquenil, Lyrica, Imipramine, Flexeril, Tramadol, and Vicodin...  HEADACHES >> Hx migraine HAs and mixed muscle contraction HAs in the past;  Treated by DrFreeman's [facility] w/ TOPAMAX 150mg /d & Zanaflex 4mg  Tid prn;  He has also given her shots in the neck which really helped in the past... ~  9/12: pt reports f/u eval DrFreeman w/ trigger point injections and incr Topamax to 150mg /d; she states improved...  ANXIETY (ICD-300.00) - not currently taking medication.  ANEMIA (ICD-285.9) ~  labs 7/09 from [place] showed Hg= 12.4 ~  labs 9/11 here showed Hg= 13.0 ~  Labs 3/12 showed Hg= 12.8  SEBACEOUS CYST, SCALP (ICD-706.2) - this resolved w/ Keflex... she has a hx of MRSA exposure from her child and had a MRSA buttock abscess drained by Trecia Rogers 9/08...   Past Surgical History  Procedure Date  . Tubal ligation   . Asd repair     Outpatient Encounter Prescriptions as of 06/04/2011  Medication Sig Dispense Refill  . albuterol (PROAIR HFA) 108 (90 BASE) MCG/ACT inhaler Inhale 2 puffs into the lungs every 6 (six) hours as needed. For shortness of breath and wheezing      . cetirizine (ZYRTEC) 10 MG chewable tablet Chew 10 mg by mouth daily.        Marland Kitchen  chlorpheniramine-HYDROcodone (TUSSIONEX PENNKINETIC ER) 10-8 MG/5ML LQCR Take 5 mLs by mouth every 12  (twelve) hours.  115 mL  0  . Cholecalciferol (VITAMIN D) 1000 UNITS capsule Take 1,000 Units by mouth daily.        Marland Kitchen levothyroxine (SYNTHROID, LEVOTHROID) 75 MCG tablet TAKE 1 TABLET EVERY DAY  30 tablet  0  . Multiple Vitamins-Minerals (CENTRUM PO) Take 1 tablet by mouth daily.       . naproxen (NAPROSYN) 500 MG tablet Take 1 tablet (500 mg total) by mouth 2 (two) times daily.  30 tablet  0  . omeprazole (PRILOSEC) 40 MG capsule Take 1 capsule (40 mg total) by mouth 2 (two) times daily.  180 capsule  3  . polyethylene glycol powder (GLYCOLAX/MIRALAX) powder as directed.      . pseudoephedrine-guaifenesin (MUCINEX D) 60-600 MG per tablet Take 1 tablet by mouth every 12 (twelve) hours.       . sodium chloride (OCEAN) 0.65 % nasal spray Place 2 sprays into the nose as needed.        Marland Kitchen tiZANidine (ZANAFLEX) 4 MG tablet Take 4 mg by mouth every 8 (eight) hours as needed. DO NOT EXCEED 3 DOSES IN A 24 HOUR PERIOD       . topiramate (TOPAMAX) 100 MG tablet Take 150 mg by mouth at bedtime.         No Known Allergies   Current Medications, Allergies, Past Medical History, Past Surgical History, Family History, and Social History were reviewed in [employer] record.    Review of Systems          Constitutional:   No  weight loss, night sweats,  Fevers, chills, fatigue, or  lassitude.  HEENT:   No headaches,  Difficulty swallowing,  Tooth/dental problems, or  Sore throat,                No sneezing, itching, ear ache,  +nasal congestion, post nasal drip,   CV:  No chest pain,  Orthopnea, PND, swelling in lower extremities, anasarca, dizziness, palpitations, syncope.   GI  No heartburn, indigestion, abdominal pain, nausea, vomiting, diarrhea, change in bowel habits, loss of appetite, bloody stools.   Resp:   No excess mucus, no productive cough,  No non-productive cough,  No coughing up of blood.  No change in color of mucus.  No wheezing.  No chest wall  deformity  Skin: no rash or lesions.  GU: no dysuria, change in color of urine, no urgency or frequency.  No flank pain, no hematuria   MS:  No joint pain or swelling.  No decreased range of motion.  No back pain.  Psych:  No change in mood or affect. No depression or anxiety.  No memory loss.       Objective:   Physical Exam      WD, Thin, [name] in NAD... GENERAL:  Alert & oriented; pleasant & cooperative... HEENT:  [place]/AT, EACs-clear, TMs-wnl, NOSE- congested, clear discharge; THROAT-clear & wnl, no exudate or lesions noted.  NECK: supple w/ mild decrROM; no JVD; normal carotid impulses w/o bruits; no thyromegaly or nodules palpated; no lymphadenopathy. CHEST:  Clear to P & A; without wheezes/ rales/ or rhonchi. HEART:  Reg bradycardia;  gr 1 SEM without rubs or gallops... ABDOMEN:  Soft & nontender; normal bowel sounds; no organomegaly or masses detected. EXT: without deformities, mild arthritic changes; no varicose veins/ venous insuffic/ or edema. NEURO:  Intact/ w/o focal deficits  noted. .. DERM:  No lesions noted; no rash etc...   RADIOLOGY DATA:  Reviewed in the EPIC EMR & discussed w/ the patient...  LABORATORY DATA:  Reviewed in the EPIC EMR & discussed w/ the patient...   Assessment & Plan:

## 2011-06-04 NOTE — Patient Instructions (Signed)
May use Nexium 40mg  Twice daily or Aciphex 20mg  Twice daily   in place of Omeprazole until samples are gone. Then go back to Omeprazole  Add Pepcid 20mg  At bedtime   GERD Diet .  Saline nasal rinses As needed   follow up Dr. Kriste Basque  In 6 weeks as planned and As needed   Please contact office for sooner follow up if symptoms do not improve or worsen or seek emergency care

## 2011-06-05 NOTE — Assessment & Plan Note (Signed)
Flare   Zyrtec daily  Saline nasal rinses As needed

## 2011-06-05 NOTE — Assessment & Plan Note (Signed)
Flare:  May use Nexium 40mg  Twice daily or Aciphex 20mg  Twice daily   in place of Omeprazole until samples are gone. Then go back to Omeprazole  Add Pepcid 20mg  At bedtime   GERD Diet .   follow up Dr. Kriste Basque  In 6 weeks as planned and As needed   Please contact office for sooner follow up if symptoms do not improve or worsen or seek emergency care

## 2011-06-11 ENCOUNTER — Telehealth: Payer: Self-pay | Admitting: Pulmonary Disease

## 2011-06-11 MED ORDER — AMOXICILLIN 500 MG PO CAPS: ORAL_CAPSULE | ORAL | Status: AC

## 2011-06-11 NOTE — Telephone Encounter (Signed)
Per SN---ok for pt to have amoxicillin 500mg   #4  Take 4 tablets by mouth 1 hour prior to dental procedure.  This has been sent to the pts pharmacy and pt is aware.

## 2011-06-11 NOTE — Telephone Encounter (Signed)
I spoke with Brittany Hodges and is requesting an abx prior to her dentist on 06/18/11. She states her dentists states if SN could call this in for her since she was going to be a new Brittany Hodges and has a history of brady cardia as a precaution. She states she is going to have her teeth cleaned. Please advise Dr. Kriste Basque, thanks  No Known Allergies

## 2011-07-17 ENCOUNTER — Telehealth: Payer: Self-pay | Admitting: Pulmonary Disease

## 2011-07-17 MED ORDER — AMOXICILLIN-POT CLAVULANATE 875-125 MG PO TABS: 1.0000 | ORAL_TABLET | Freq: Two times a day (BID) | ORAL | Status: AC

## 2011-07-17 NOTE — Telephone Encounter (Signed)
Pt c/o right ear pain since Mon., 2/25. Sxs have gotten worse and she now has HA, body aches and chills, low grade fever, N&V this morning. She denies any SOB or cough/congestion. Pls advise.No Known Allergies

## 2011-07-17 NOTE — Telephone Encounter (Signed)
Per SN- if she feels needs to be seen today- may need to go to ED or UC vs call in augmentin 875 mg # 20 1 po bid and align once daily plus fluids, tylenol,rest.  ATC pt, LMOMTCB x 1

## 2011-07-17 NOTE — Telephone Encounter (Signed)
Pt stated she is returning triage's call.  Holly D Pryor ° °

## 2011-07-17 NOTE — Telephone Encounter (Signed)
Spoke with pt and notified of recs per SN. Pt verbalized understanding and states that she prefers that abx be called in. I have sent rx to her pharmacy and advised that if her symptoms worsen, to seek emergency care. Pt verbalized understanding.

## 2011-08-11 ENCOUNTER — Ambulatory Visit: Payer: BC Managed Care – PPO | Admitting: Internal Medicine

## 2011-08-17 ENCOUNTER — Ambulatory Visit (INDEPENDENT_AMBULATORY_CARE_PROVIDER_SITE_OTHER): Payer: BC Managed Care – PPO | Admitting: Internal Medicine

## 2011-08-17 ENCOUNTER — Encounter: Payer: Self-pay | Admitting: Internal Medicine

## 2011-08-17 VITALS — BP 138/80 | HR 40 | Ht 68.0 in | Wt 140.0 lb

## 2011-08-17 DIAGNOSIS — I495 Sick sinus syndrome: Secondary | ICD-10-CM

## 2011-08-17 NOTE — Progress Notes (Signed)
HPI  Brittany Hodges is a 46 y.o. female is seen in followup for junctional rhythm in the setting of atrial septal defect repair. In the past she has had assessments of chrontropic competence which have been adequate. Exercise recently has done pretty well. She does have mild limitations but they seem to be somewhat less than previously.  She continues to have episodes of chest discomfort. These are unrelated to exertion. She is exercising 3-4 days per week at the gym She underwent myoview scanning in the fall 2011 demonstrating mild ischemia v attenusation with EF 52% and min LV dilitation.    Past Medical History  Diagnosis Date  . Allergic rhinitis, cause unspecified   . Asthma   . Chest pain, unspecified     cardiac cath 07/31/10: normal coronaries; vigorous LVF  . Atrial septal defect     s/p repair;   Echo 08/07/10: EF 55-60%; mild MR; atrial septal aneurysm; no residual ASD  . Sinoatrial node dysfunction     eval. for chronotropic competence completed in past  . Hypothyroidism   . Esophageal reflux   . Irritable bowel syndrome   . Hematuria, unspecified   . Rheumatoid arthritis   . Fibromyalgia   . Anxiety   . Anemia   . Sebaceous cyst   . Hypertension   . Low back pain   . Headache     Past Surgical History  Procedure Date  . Tubal ligation   . Asd repair     Current Outpatient Prescriptions  Medication Sig Dispense Refill  . albuterol (PROAIR HFA) 108 (90 BASE) MCG/ACT inhaler Inhale 2 puffs into the lungs every 6 (six) hours as needed. For shortness of breath and wheezing      . cetirizine (ZYRTEC) 10 MG chewable tablet Chew 10 mg by mouth daily.        . chlorpheniramine-HYDROcodone (TUSSIONEX PENNKINETIC ER) 10-8 MG/5ML LQCR Take 5 mLs by mouth every 12 (twelve) hours.  115 mL  0  . Cholecalciferol (VITAMIN D) 1000 UNITS capsule Take 1,000 Units by mouth daily.        Marland Kitchen levothyroxine (SYNTHROID, LEVOTHROID) 75 MCG tablet TAKE 1 TABLET EVERY DAY  30 tablet   0  . Multiple Vitamins-Minerals (CENTRUM PO) Take 1 tablet by mouth daily.       . naproxen (NAPROSYN) 500 MG tablet Take 1 tablet (500 mg total) by mouth 2 (two) times daily.  30 tablet  0  . omeprazole (PRILOSEC) 40 MG capsule Take 1 capsule (40 mg total) by mouth 2 (two) times daily.  180 capsule  3  . polyethylene glycol powder (GLYCOLAX/MIRALAX) powder as directed.      . pseudoephedrine-guaifenesin (MUCINEX D) 60-600 MG per tablet Take 1 tablet by mouth every 12 (twelve) hours.       . sodium chloride (OCEAN) 0.65 % nasal spray Place 2 sprays into the nose as needed.        Marland Kitchen tiZANidine (ZANAFLEX) 4 MG tablet Take 4 mg by mouth every 8 (eight) hours as needed. DO NOT EXCEED 3 DOSES IN A 24 HOUR PERIOD       . topiramate (TOPAMAX) 100 MG tablet Take 150 mg by mouth at bedtime.         No Known Allergies  Review of Systems negative except from HPI and PMH  Physical Exam Well developed and well nourished in no acute distress HENT normal E scleral and icterus clear Neck Supple JVP flat Clear to ausculation Slow but Regular  rate and rhythm, 2/6 systolic murmur Soft with active bowel sounds No clubbing cyanosis and edema Alert and oriented, grossly normal motor and sensory function Skin Warm and Dry  ECG Sinus rhythm at 40 Interval 0.15/0.09/48 Axis is 82 Assessment and  Plan

## 2011-08-17 NOTE — Patient Instructions (Signed)
Your physician wants you to follow-up in: 12 months with Dr. Klein. You will receive a reminder letter in the mail two months in advance. If you don't receive a letter, please call our office to schedule the follow-up appointment.  

## 2011-08-17 NOTE — Assessment & Plan Note (Signed)
Stable and iwthout symptoms

## 2011-08-24 ENCOUNTER — Ambulatory Visit (INDEPENDENT_AMBULATORY_CARE_PROVIDER_SITE_OTHER): Payer: BC Managed Care – PPO | Admitting: Pulmonary Disease

## 2011-08-24 ENCOUNTER — Encounter: Payer: Self-pay | Admitting: Pulmonary Disease

## 2011-08-24 VITALS — BP 120/80 | HR 50 | Temp 98.4°F | Ht 68.5 in | Wt 140.0 lb

## 2011-08-24 DIAGNOSIS — J309 Allergic rhinitis, unspecified: Secondary | ICD-10-CM

## 2011-08-24 DIAGNOSIS — K219 Gastro-esophageal reflux disease without esophagitis: Secondary | ICD-10-CM

## 2011-08-24 DIAGNOSIS — R51 Headache: Secondary | ICD-10-CM

## 2011-08-24 DIAGNOSIS — M069 Rheumatoid arthritis, unspecified: Secondary | ICD-10-CM

## 2011-08-24 DIAGNOSIS — J45909 Unspecified asthma, uncomplicated: Secondary | ICD-10-CM

## 2011-08-24 DIAGNOSIS — I10 Essential (primary) hypertension: Secondary | ICD-10-CM

## 2011-08-24 DIAGNOSIS — I495 Sick sinus syndrome: Secondary | ICD-10-CM

## 2011-08-24 DIAGNOSIS — K589 Irritable bowel syndrome without diarrhea: Secondary | ICD-10-CM

## 2011-08-24 DIAGNOSIS — IMO0001 Reserved for inherently not codable concepts without codable children: Secondary | ICD-10-CM

## 2011-08-24 DIAGNOSIS — Q211 Atrial septal defect: Secondary | ICD-10-CM

## 2011-08-24 DIAGNOSIS — E039 Hypothyroidism, unspecified: Secondary | ICD-10-CM

## 2011-08-24 DIAGNOSIS — K5900 Constipation, unspecified: Secondary | ICD-10-CM

## 2011-08-24 DIAGNOSIS — Q2111 Secundum atrial septal defect: Secondary | ICD-10-CM

## 2011-08-24 DIAGNOSIS — F411 Generalized anxiety disorder: Secondary | ICD-10-CM

## 2011-08-24 NOTE — Patient Instructions (Signed)
Today we updated your med list in our EPIC system...    Continue your current medications the same...  Call for any problems or if we can be of service in any way...  Let's plan a follow up visit in 6 months... 

## 2011-09-07 ENCOUNTER — Telehealth: Payer: Self-pay | Admitting: Pulmonary Disease

## 2011-09-07 NOTE — Telephone Encounter (Signed)
[email or web address].Caterra Ostroff, CMA  

## 2011-09-07 NOTE — Telephone Encounter (Signed)
Pt returned triage's call.  Holly D Pryor ° °

## 2011-09-08 NOTE — Telephone Encounter (Signed)
LMOMTCB x 1 

## 2011-09-09 NOTE — Telephone Encounter (Signed)
I spoke with pt and she c/o nasal congestion, teeth hurting, right ear pain, PND x Saturday. Denies any f/c/n/v. Pt has been taking mucinex and zyrtec. She had an rx for augmentin 875 and started this Sunday and now states she is feeling much better. Pt will call back if she gets worse or does not improve.

## 2011-09-14 ENCOUNTER — Encounter: Payer: Self-pay | Admitting: Pulmonary Disease

## 2011-09-14 NOTE — Progress Notes (Addendum)
Subjective:    Patient ID: Brittany Hodges, female    DOB: [birth year], 46 y.o.   MRN: [id number]  HPI 46 y/o BF here for a follow up visit...  she has mult medical problems including AR & Asthma;  HBP;  Hx CP & ASD repair;  Hypothyroid;  GERD/ IBS/ constipation;  hx hematuria evaluated by Thermon Leyland;  RA/ FM/ LBP follwed by DrDeveshwar;  Chronic headaches/ Migraines followed by DrFreeman;  Anxiety...  ~  January 22, 2010:  14 month ROV- she's been OOW x several yrs and unemployment ran out,  just got new job & here to re-establish> she is off all prev meds (see below) & only taking MVI, Vit D, & Dexilant samples from [facility]... she was seen by ENT in [place] recently w/ throat symptoms related to reflux & Dexilant started;  also found to have TMJ problem, and c/o sinus congestion> we discussed Saline, Nasonex, Breathe-right nasal strips Qhs... non-fasting labs done today & WNL x TSH=7.79 & Synthroid 58mcg/d started...  ~  March 25, 2010:  she is feeling sl better on the Levothy75 & we will recheck her TSH today (4.67) to adjust as needed;  FLP 10/11 by Cards looked good;  HR is chronically slow (40's) & she denies recurrent CP, not dizzy etc...    She saw DrKlein 9/11for CP & Bradycardia (junctional brady) in the setting of prev ASD repair- not on BP/vasoactive meds, heart rate in the 40's... she had Myoview 10/11 which was abnormal w/ mild revers defect in ant wall, mild LV dil & EF= 52%... she tells me they are trying to hold off on pacemaker.   She also saw DrNesi due to episode of gross then micro-hematuria> his UA was clear & Sonar is planned & pending at this time...   ~  July 23, 2010:  Presents c/o right ear pain, sinus congestion & drainage, no help from OTC Zyrtek;  she saw TP 2/12 w/ LER & Omep40 incr to Bid;  we discussed rx w/ Augmentin, Depo/ Dosepak, Mucinex, Saline nasal sp & Flonase (all symptoms resolved on meds)...    She saw DrNesi 11/11 for f/u hematuria> renal sonar neg for any  lesions or stones, Cysto was neg as well & they plan routine f/u to check for any recurrent blood...    She also had a f/u DrFreeman 2/12 for her Migraines> on Topamax (75mg /d) prevention & Zanaflex (4mg ) as needed;  he did mult trigger point injections which he says was very helpful to her in the past (& they helped again)...  ~  July 24,2012:  Chad had persist CP 3/12 & DrKlein ordered a Cath> see below: cath neg w/ norm LVF & no signif CAD; she has been doing well, exercising daily w/o difficulty, denies palpit/ dizzy/ syncope/ etc... She also had f/u DrNesi last week & reports that her urine was clear, no blood...  We reviewed her interim cardiac visits, tests, & labs...  Her CC= some diffuse arhtritic pain & she uses OTC Ibuprofen Prn...  ~  January 22, 2011:  6wk ROV & add-on for sinus pressure & headache all over but the back-story is very interesting> she has had 2 ER visits ([facility] & [place]), mult phone calls w/ meds called-in & visit w/ [place] [facility] & CT sinus done yest> all for the same symptoms of sinus pressure, drainage, & HA all over ("even my eyes & teeth hurt"); recall hx chronic headaches managed by DrFreeman's clinic on Topamax &  Zanaflex but "shots in neck" helped the most she said;  Over the last month she has had several antibiotics, pred, pain meds, & nausea med (she didn't bring any list or bottles)...  CT Sinus done 01/21/11 at [facility] was essentially neg w/o acute sinusitis & min chronic changes...  She desperately wants treatment & we agreed to prescribe Avelox, Depo80, Pred taper, Mucinex, Saline, Vicodin> but she really needs to see DrFreeman for HA clinic follow up... See prob list below:  ~  March 17, 2011:  6wk ROV & she reports f/u w/ DrFreeman [facility] (note pending) dx as migraines & related to her hormones & the weather she says; they inr her Topamax to 150mg /d & injected some trigger points- she feels better;  She finished the sinusitis Rx & improved as well-  she is asked to f/u w/ her ENT for any further sinus problems;  She saw DrKlein 9/12 for f/u of a junctional rhythm in the setting of her ASD repair- SAnode dysfunction w/ [name], she is asymptomatic & exercise heart rate is adeq, atypCP w/ non-ischemic myoview 2011 & neg cath 11/2010> no changes made...  See prob list below:  ~  April 15, 2011:  76mo ROV & she wants to go over her blood work (FLP-wnl, [place]-wnl, CBCw/ WBC=2.2 nl diff, TSH-wnl) & get a CXR today (clear, wnl); she has no other complaints at this time!!!  ~  August 24, 2011:  78mo ROV & Makaela notes some prob w/ her sinuses recently & we reviewed the rec for Zyrtek, Saline, etc... She's had 2 ER visits in the interval: 12/12 for back discomfort- pain reproduced w/ palp right chest wall, CXR- clear/NAD, given pain Rx; and another visit 12/12 w/ cough/ sputum, SOB- chest was clear, given antibiotic & told to f/u here;  Pt is reminded to call us first w/ these non-critical problems to avoid ER expenses...     She saw TP 1/13 w/ tongue itching & sens of lips but no angioedema seen; notes some reflux & drainage & she prefers Nexium but insurance won't cover; not on ACE or ARB; given samples and asked to add Pepcid Qhs...    She had f/u DrKlein 4/13> Hx junctional rhythm in the setting of ASD repair; Myoview 2011 showed attenuation vs mild ischemia, EF=52% & min LVdil; exercising at gym 3-4d per week, has non-exertional chest discomfort, no changes made... EKG 4/13 showed SBrady, rate40, junctional beats, DrKlein reviewed & did not feel she needed holter...           Problem List:   As noted> she has mult specialists tending to ALL her medical problems...  ALLERGIC RHINITIS (ICD-477.9) - on OTC Antihist (eg- ZYRTEK) Qam; Saline nasal sp every 1-2 H during the day + MUCINEX 2Bid; & FLONASE 2spQhs... rec to use breathe right nasal strips at bedtime Prn to help w/ drainage... she knows to avoid Afrin due to BP. ~  9/11:  She reports that recent  ENT eval in [place] showed reflux related ENT symptoms & Rx w/ Dexilant> ch to OMEPRAZOLE 40mg Bid. ~  9/12:  She reports recent ENT eval in [place] for "sinus pressure" & CT scan showed essentially neg w/o acute sinusitis & min chronic changes...  ASTHMA (ICD-493.90) -  uses PROAIR as needed... denies cough, sputum, hemoptysis, worsening dyspnea, wheezing, chest pains, snoring, daytime hypersomnolence, etc... On the OMEP Bid & antireflux regimen to help nocturnal symptoms... ~  CXR 8/09 showed prev median sternotomy, heart norm size,  lungs clear, +scoliosis... ~  CXR 11/12 showed heart size normal, lungs clear, mild scoliosis, NAD...  HYPERTENSION (ICD-401.9) - controlled on low salt diet alone (prev on Norvasc & avoiding meds due to her bradycardia)...  ~  9/12: BP= 110/62 today & doing satis w/o medication; denies visual changes, CP, palipit, dizziness, syncope, dyspnea, edema, etc...  ~  10/12: BP= 118/78 & stable- remains asymptomatic x for her mult somatic complaints...  Hx of CHEST PAIN (ICD-786.50) >> most of it is AtypCWP related to her FM... Hx of ATRIAL SEPTAL DEFECT (ICD-745.5) - S/P ASD repair 1985> She is followed by DrStuckey & DrKlein... SA NODE DYSFUNCTION >> ~  EKG shows junctional bradycardia & NSSTTWA... ~  2DEcho 7/06 showed tr MR & TR, normal LVF... ~  Followed by DrKlein for hx SA node dysfunction/ junctional rhythm in the setting of prev ASD repair- doing OK/ stable rhythm/ good exerc capacity. ~  She saw DrKlein 9/11 for CP & Bradycardia (junctional brady)- not on BP/vasoactive meds, heart rate in the 40's> she tells me they are trying to hold off on pacemaker as long as poss. ~  Myoview 10/11 was abnormal w/ mild revers defect in ant wall, mild LV dil & EF= 52%... ~  persistant vs recurrent CP 3/12 lead to Cath by DrStuckey> norm coronaries w/ min luminal irregularities, well preserved LVF... ~  2DEcho 4/12 showed norm LV size & funct w/ EF=55-60%, no regional wall motion abn,  mildMR, trivAI, +atrial septal aneurysm ~  9/12: f/u DrKlein & stable, no changes made...  HYPOTHYROIDISM (ICD-244.9) - on SYNTHROID 67mcg/d> prev on Synthroid 61mcg/d per DrKerr, but she was out of meds for over a yr when she returned here 9/11... ~  TSH here in 2007 = 1.32 ~  9/09: note from DrKerr reviewed- she had been off Synthroid & TSH was 13.14 w/ Synthroid restarted. ~  labs here 9/11 off meds showed TSH= 7.79... rec> start SYNTHROID 82mcg/d. ~  labs 11/11 on Levo75 showed TSH= 4.67 ~  Labs 3/12 on Levo75 showed TSH= 1.53 ~  Labs 11/12 on Levo75 showed TSH= 4.02  GERD (ICD-530.81) - on OMEPRAZOLE 40mg Bid IRRITABLE BOWEL SYNDROME (ICD-564.1)   << GI - followed by DrWalker in [place]  >> CONSTIPATION (ICD-564.00) ~  12/12: she had colonoscopy by DrMShearin in [place]> neg colon w/o polyps, divertics, etc; felt to have IBS-c...  HEMATURIA UNSPECIFIED (ICD-599.70) - she developed gross hematuria 9/11- neg culture; f/u showed microhematuria & referred to Urology;  seen by [facility] 10/11 & his UA was neg- Renal Sonar neg for any lesions or stones & Cysto was neg as well (DrNesi continues to follow pt).  RHEUMATOID ARTHRITIS (ICD-714.0)   <<  Rheum -  followed by DrDeveshwar  >> FIBROMYALGIA (ICD-729.1)                  prev Rx> Pred, MTX, Humira, Enbrel - off all meds since 2010 LOW BACK PAIN (ICD-724.2) ~  she has a hx of RA and Fibromyalgia and was followed by DrDeveshwar for Rheumatology- prev on Prednisone, Plaquenil, Lyrica, Imipramine, Flexeril, Tramadol, and Vicodin...  HEADACHES >> Hx migraine HAs and mixed muscle contraction HAs in the past;  Treated by DrFreeman's [facility] w/ TOPAMAX 150mg /d & Zanaflex 4mg  Tid prn;  He has also given her shots in the neck which really helped in the past... ~  9/12: pt reports f/u eval DrFreeman w/ trigger point injections and incr Topamax to 150mg /d; she states improved...  ANXIETY (ICD-300.00) -  not currently taking medication.  ANEMIA (ICD-285.9) ~   labs 7/09 from [place] showed Hg= 12.4 ~  labs 9/11 here showed Hg= 13.0 ~  Labs 3/12 showed Hg= 12.8  SEBACEOUS CYST, SCALP (ICD-706.2) - this resolved w/ Keflex... she has a hx of MRSA exposure from her child and had a MRSA buttock abscess drained by Trecia Rogers 9/08...   Past Surgical History  Procedure Date  . Tubal ligation   . Asd repair     Outpatient Encounter Prescriptions as of 08/24/2011  Medication Sig Dispense Refill  . albuterol (PROAIR HFA) 108 (90 BASE) MCG/ACT inhaler Inhale 2 puffs into the lungs every 6 (six) hours as needed. For shortness of breath and wheezing      . cetirizine (ZYRTEC) 10 MG chewable tablet Chew 10 mg by mouth daily.        . chlorpheniramine-HYDROcodone (TUSSIONEX PENNKINETIC ER) 10-8 MG/5ML LQCR Take 5 mLs by mouth every 12 (twelve) hours.  115 mL  0  . Cholecalciferol (VITAMIN D) 1000 UNITS capsule Take 1,000 Units by mouth daily.        Marland Kitchen levothyroxine (SYNTHROID, LEVOTHROID) 75 MCG tablet TAKE 1 TABLET EVERY DAY  30 tablet  0  . Multiple Vitamins-Minerals (CENTRUM PO) Take 1 tablet by mouth daily.       . naproxen (NAPROSYN) 500 MG tablet Take 1 tablet (500 mg total) by mouth 2 (two) times daily.  30 tablet  0  . omeprazole (PRILOSEC) 40 MG capsule Take 1 capsule (40 mg total) by mouth 2 (two) times daily.  180 capsule  3  . polyethylene glycol powder (GLYCOLAX/MIRALAX) powder as directed.      . pseudoephedrine-guaifenesin (MUCINEX D) 60-600 MG per tablet Take 1 tablet by mouth every 12 (twelve) hours.       . sodium chloride (OCEAN) 0.65 % nasal spray Place 2 sprays into the nose as needed.        Marland Kitchen tiZANidine (ZANAFLEX) 4 MG tablet Take 4 mg by mouth every 8 (eight) hours as needed. DO NOT EXCEED 3 DOSES IN A 24 HOUR PERIOD       . topiramate (TOPAMAX) 100 MG tablet Take 150 mg by mouth at bedtime.         No Known Allergies   Current Medications, Allergies, Past Medical History, Past Surgical History, Family History, and Social History were  reviewed in [employer] record.    Review of Systems         See HPI - all other systems neg except as noted...  The patient complains of intermittent hoarseness, & some aching/ soreness  The patient denies anorexia, fever, weight loss, weight gain, vision loss, decreased hearing, chest pain, syncope, dyspnea on exertion, peripheral edema, prolonged cough, hemoptysis, abdominal pain, melena, hematochezia, severe indigestion/heartburn, hematuria, incontinence, muscle weakness, suspicious skin lesions, transient blindness, difficulty walking, depression, unusual weight change, abnormal bleeding, enlarged lymph nodes, and angioedema.     Objective:   Physical Exam      WD, Thin, [name] in NAD... GENERAL:  Alert & oriented; pleasant & cooperative... HEENT:  [address]/AT, EOM-wnl, PERRLA, EACs-clear, TMs-wnl, NOSE- congested, clear discharge; THROAT-clear & wnl. NECK: supple w/ mild decrROM; no JVD; normal carotid impulses w/o bruits; no thyromegaly or nodules palpated; no lymphadenopathy. CHEST:  Clear to P & A; without wheezes/ rales/ or rhonchi. HEART:  Reg bradycardia;  gr 1 SEM without rubs or gallops... ABDOMEN:  Soft & nontender; normal bowel sounds; no organomegaly or masses  detected. EXT: without deformities, mild arthritic changes; no varicose veins/ venous insuffic/ or edema. NEURO:  CNs intact; no focal neuro deficits... +trigger points esp in trapezius area... DERM:  No lesions noted; no rash etc...  RADIOLOGY DATA:  Reviewed in the EPIC EMR & discussed w/ the patient...  LABORATORY DATA:  Reviewed in the EPIC EMR & discussed w/ the patient...   Assessment & Plan:   Hx AR & Asthma>  Stable on antihist, saline, mucinex, FLONASE, & PROAIR prn (also on Omep40Bid & reflux regimen)...  HBP>  Controlled on low salt diet, not on meds; BP looks good, continue to monitor...  CP (non-cardiac), s/p ASD repair, junctional bradycardia>  Followed by Drs Riley Kill & Graciela Husbands-  their notes are reviewed...  Note that her FLP remains normal on diet alone...  HYPOTHYROID>  Stable clinically & biochemically euthyroid on the dose...  GI> GERD, IBS, Constip>  Followed by DrWalker in [place]...  GU> hx hematuria w/ neg eval>  Followed by Thermon Leyland...  Rheum> RA, FM, LBP>  She has been followed by DrDeveshwar in the past (see above)...  Headaches>  Followed by DrFreeman & improved after trigger point injections 9/12...   Patient's Medications  New Prescriptions   No medications on file  Previous Medications   ALBUTEROL (PROAIR HFA) 108 (90 BASE) MCG/ACT INHALER    Inhale 2 puffs into the lungs every 6 (six) hours as needed. For shortness of breath and wheezing   CETIRIZINE (ZYRTEC) 10 MG CHEWABLE TABLET    Chew 10 mg by mouth daily.     CHLORPHENIRAMINE-HYDROCODONE (TUSSIONEX PENNKINETIC ER) 10-8 MG/5ML LQCR    Take 5 mLs by mouth every 12 (twelve) hours.   CHOLECALCIFEROL (VITAMIN D) 1000 UNITS CAPSULE    Take 1,000 Units by mouth daily.     LEVOTHYROXINE (SYNTHROID, LEVOTHROID) 75 MCG TABLET    TAKE 1 TABLET EVERY DAY   MULTIPLE VITAMINS-MINERALS (CENTRUM PO)    Take 1 tablet by mouth daily.    NAPROXEN (NAPROSYN) 500 MG TABLET    Take 1 tablet (500 mg total) by mouth 2 (two) times daily.   OMEPRAZOLE (PRILOSEC) 40 MG CAPSULE    Take 1 capsule (40 mg total) by mouth 2 (two) times daily.   POLYETHYLENE GLYCOL POWDER (GLYCOLAX/MIRALAX) POWDER    as directed.   PSEUDOEPHEDRINE-GUAIFENESIN (MUCINEX D) 60-600 MG PER TABLET    Take 1 tablet by mouth every 12 (twelve) hours.    SODIUM CHLORIDE (OCEAN) 0.65 % NASAL SPRAY    Place 2 sprays into the nose as needed.     TIZANIDINE (ZANAFLEX) 4 MG TABLET    Take 4 mg by mouth every 8 (eight) hours as needed. DO NOT EXCEED 3 DOSES IN A 24 HOUR PERIOD    TOPIRAMATE (TOPAMAX) 100 MG TABLET    Take 150 mg by mouth at bedtime.   Modified Medications   No medications on file  Discontinued Medications   No medications on file

## 2012-01-11 ENCOUNTER — Telehealth: Payer: Self-pay | Admitting: Pulmonary Disease

## 2012-01-11 NOTE — Telephone Encounter (Signed)
Spoke with the pt and she states she went to the ER last night because she had a bad migraine that she could not control with her meds. SHe states they did some blood work and she wants Dr. Kriste Basque to review because some of the levels were high. She is faxing results to triage fax. Once received I will place on SN look-at. Await fax.[name], CMA

## 2012-01-11 NOTE — Telephone Encounter (Signed)
lmomtcb for the pt.  

## 2012-01-11 NOTE — Telephone Encounter (Signed)
Labs placed on SN look-at.[name], CMA

## 2012-01-12 ENCOUNTER — Other Ambulatory Visit: Payer: Self-pay | Admitting: Pulmonary Disease

## 2012-01-12 DIAGNOSIS — D649 Anemia, unspecified: Secondary | ICD-10-CM

## 2012-01-12 NOTE — Telephone Encounter (Signed)
Called and spoke with pt and she is aware of SN recs of these labs.  She is aware that these other labs will be put in the computer for her and she will come by in the morning for these labs and she is aware we will call her with these results once they are back.

## 2012-01-13 ENCOUNTER — Other Ambulatory Visit (INDEPENDENT_AMBULATORY_CARE_PROVIDER_SITE_OTHER): Payer: BC Managed Care – PPO

## 2012-01-13 DIAGNOSIS — D649 Anemia, unspecified: Secondary | ICD-10-CM

## 2012-01-13 LAB — HEPATIC FUNCTION PANEL
ALT: 15 U/L (ref 0–35)
AST: 24 U/L (ref 0–37)
Albumin: 3.7 g/dL (ref 3.5–5.2)
Alkaline Phosphatase: 42 U/L (ref 39–117)
Bilirubin, Direct: 0.1 mg/dL (ref 0.0–0.3)
Total Bilirubin: 0.5 mg/dL (ref 0.3–1.2)
Total Protein: 7.4 g/dL (ref 6.0–8.3)

## 2012-01-13 LAB — CBC WITH DIFFERENTIAL/PLATELET
Basophils Absolute: 0 10*3/uL (ref 0.0–0.1)
Basophils Relative: 0.9 % (ref 0.0–3.0)
Eosinophils Absolute: 0.1 10*3/uL (ref 0.0–0.7)
Eosinophils Relative: 1.4 % (ref 0.0–5.0)
HCT: 34.8 % — ABNORMAL LOW (ref 36.0–46.0)
Hemoglobin: 10.9 g/dL — ABNORMAL LOW (ref 12.0–15.0)
Lymphocytes Relative: 46.7 % — ABNORMAL HIGH (ref 12.0–46.0)
Lymphs Abs: 1.7 10*3/uL (ref 0.7–4.0)
MCHC: 31.3 g/dL (ref 30.0–36.0)
MCV: 85.2 fl (ref 78.0–100.0)
Monocytes Absolute: 0.3 10*3/uL (ref 0.1–1.0)
Monocytes Relative: 7.4 % (ref 3.0–12.0)
Neutro Abs: 1.6 10*3/uL (ref 1.4–7.7)
Neutrophils Relative %: 43.6 % (ref 43.0–77.0)
Platelets: 154 10*3/uL (ref 150.0–400.0)
RBC: 4.08 Mil/uL (ref 3.87–5.11)
RDW: 16.1 % — ABNORMAL HIGH (ref 11.5–14.6)
WBC: 3.6 10*3/uL — ABNORMAL LOW (ref 4.5–10.5)

## 2012-01-13 LAB — SEDIMENTATION RATE: Sed Rate: 4 mm/hr (ref 0–22)

## 2012-01-15 LAB — PROTEIN ELECTROPHORESIS, SERUM, WITH REFLEX
Albumin ELP: 51.7 % — ABNORMAL LOW (ref 55.8–66.1)
Alpha-1-Globulin: 3.9 % (ref 2.9–4.9)
Alpha-2-Globulin: 8.4 % (ref 7.1–11.8)
Beta 2: 5.9 % (ref 3.2–6.5)
Beta Globulin: 6.2 % (ref 4.7–7.2)
Gamma Globulin: 23.9 % — ABNORMAL HIGH (ref 11.1–18.8)
Total Protein, Serum Electrophoresis: 7.3 g/dL (ref 6.0–8.3)

## 2012-01-21 NOTE — Progress Notes (Signed)
Quick Note:  Pt aware of results. Will get OTC MVI with IRON to take once daily. ______

## 2012-01-24 ENCOUNTER — Emergency Department (HOSPITAL_BASED_OUTPATIENT_CLINIC_OR_DEPARTMENT_OTHER): Payer: BC Managed Care – PPO

## 2012-01-24 ENCOUNTER — Emergency Department (HOSPITAL_BASED_OUTPATIENT_CLINIC_OR_DEPARTMENT_OTHER)
Admission: EM | Admit: 2012-01-24 | Discharge: 2012-01-24 | Disposition: A | Discharge status: Home / Self Care | Payer: BC Managed Care – PPO | Attending: Emergency Medicine | Admitting: Emergency Medicine

## 2012-01-24 ENCOUNTER — Encounter (HOSPITAL_BASED_OUTPATIENT_CLINIC_OR_DEPARTMENT_OTHER): Payer: Self-pay | Admitting: *Deleted

## 2012-01-24 DIAGNOSIS — Z79899 Other long term (current) drug therapy: Secondary | ICD-10-CM | POA: Insufficient documentation

## 2012-01-24 DIAGNOSIS — I10 Essential (primary) hypertension: Secondary | ICD-10-CM | POA: Insufficient documentation

## 2012-01-24 DIAGNOSIS — J329 Chronic sinusitis, unspecified: Secondary | ICD-10-CM

## 2012-01-24 DIAGNOSIS — J45909 Unspecified asthma, uncomplicated: Secondary | ICD-10-CM | POA: Insufficient documentation

## 2012-01-24 DIAGNOSIS — R112 Nausea with vomiting, unspecified: Secondary | ICD-10-CM | POA: Insufficient documentation

## 2012-01-24 DIAGNOSIS — R51 Headache: Secondary | ICD-10-CM | POA: Insufficient documentation

## 2012-01-24 DIAGNOSIS — E039 Hypothyroidism, unspecified: Secondary | ICD-10-CM | POA: Insufficient documentation

## 2012-01-24 DIAGNOSIS — I498 Other specified cardiac arrhythmias: Secondary | ICD-10-CM | POA: Insufficient documentation

## 2012-01-24 DIAGNOSIS — M069 Rheumatoid arthritis, unspecified: Secondary | ICD-10-CM | POA: Insufficient documentation

## 2012-01-24 DIAGNOSIS — R079 Chest pain, unspecified: Secondary | ICD-10-CM

## 2012-01-24 HISTORY — DX: Scoliosis, unspecified: M41.9

## 2012-01-24 LAB — COMPREHENSIVE METABOLIC PANEL
ALT: 13 U/L (ref 0–35)
AST: 25 U/L (ref 0–37)
Albumin: 3.9 g/dL (ref 3.5–5.2)
Alkaline Phosphatase: 55 U/L (ref 39–117)
BUN: 14 mg/dL (ref 6–23)
CO2: 22 mEq/L (ref 19–32)
Calcium: 9.7 mg/dL (ref 8.4–10.5)
Chloride: 100 mEq/L (ref 96–112)
Creatinine, Ser: 0.7 mg/dL (ref 0.50–1.10)
GFR calc Af Amer: >90 mL/min (ref >90)
GFR calc non Af Amer: >90 mL/min (ref >90)
Glucose, Bld: 82 mg/dL (ref 70–99)
Potassium: 3.8 mEq/L (ref 3.5–5.1)
Sodium: 136 mEq/L (ref 135–145)
Total Bilirubin: 0.5 mg/dL (ref 0.3–1.2)
Total Protein: 8.2 g/dL (ref 6.0–8.3)

## 2012-01-24 LAB — URINALYSIS, ROUTINE W REFLEX MICROSCOPIC
Glucose, UA: NEGATIVE mg/dL
Hgb urine dipstick: NEGATIVE
Ketones, ur: >80 mg/dL — AB
Leukocytes, UA: NEGATIVE
Nitrite: NEGATIVE
Protein, ur: NEGATIVE mg/dL
Specific Gravity, Urine: 1.028 (ref 1.005–1.030)
Urobilinogen, UA: 1 mg/dL (ref 0.0–1.0)
pH: 5.5 (ref 5.0–8.0)

## 2012-01-24 LAB — TROPONIN I
Troponin I: <0.3 ng/mL (ref <0.30)
Troponin I: <0.3 ng/mL (ref <0.30)

## 2012-01-24 LAB — CBC
HCT: 36.3 % (ref 36.0–46.0)
Hemoglobin: 11.8 g/dL — ABNORMAL LOW (ref 12.0–15.0)
MCH: 27.2 pg (ref 26.0–34.0)
MCHC: 32.5 g/dL (ref 30.0–36.0)
MCV: 83.6 fL (ref 78.0–100.0)
Platelets: 181 10*3/uL (ref 150–400)
RBC: 4.34 MIL/uL (ref 3.87–5.11)
RDW: 14.5 % (ref 11.5–15.5)
WBC: 6.1 10*3/uL (ref 4.0–10.5)

## 2012-01-24 MED ORDER — ASPIRIN 81 MG PO CHEW: 324.0000 mg | CHEWABLE_TABLET | Freq: Once | ORAL | Status: DC

## 2012-01-24 MED ORDER — KETOROLAC TROMETHAMINE 30 MG/ML IJ SOLN
30.0000 mg | Freq: Once | INTRAMUSCULAR | Status: AC
  Administered 2012-01-24: 30 mg via INTRAVENOUS
  Filled 2012-01-24: qty 1

## 2012-01-24 MED ORDER — METHYLPREDNISOLONE SODIUM SUCC 125 MG IJ SOLR
125.0000 mg | Freq: Once | INTRAMUSCULAR | Status: AC
  Administered 2012-01-24: 125 mg via INTRAVENOUS
  Filled 2012-01-24: qty 2

## 2012-01-24 MED ORDER — ONDANSETRON 8 MG PO TBDP: 8.0000 mg | ORAL_TABLET | Freq: Three times a day (TID) | ORAL | Status: AC | PRN

## 2012-01-24 MED ORDER — ASPIRIN 81 MG PO CHEW
324.0000 mg | CHEWABLE_TABLET | Freq: Once | ORAL | Status: AC
  Administered 2012-01-24: 324 mg via ORAL
  Filled 2012-01-24: qty 4

## 2012-01-24 MED ORDER — AZITHROMYCIN 250 MG PO TABS
500.0000 mg | ORAL_TABLET | Freq: Once | ORAL | Status: AC
  Administered 2012-01-24: 500 mg via ORAL
  Filled 2012-01-24: qty 2

## 2012-01-24 MED ORDER — AZITHROMYCIN 250 MG PO TABS: ORAL_TABLET | ORAL | Status: AC

## 2012-01-24 MED ORDER — DEXTROSE 5 % AND 0.9 % NACL IV BOLUS
1000.0000 mL | Freq: Once | INTRAVENOUS | Status: AC
  Administered 2012-01-24: 1000 mL via INTRAVENOUS

## 2012-01-24 MED ORDER — ONDANSETRON HCL 4 MG/2ML IJ SOLN
4.0000 mg | Freq: Once | INTRAMUSCULAR | Status: AC
  Administered 2012-01-24: 4 mg via INTRAVENOUS
  Filled 2012-01-24: qty 2

## 2012-01-24 NOTE — ED Notes (Signed)
Pt HR is 42 apically, pt placed on cardiac monitor

## 2012-01-24 NOTE — ED Notes (Signed)
Pt has hx of sinus infections. Pt states "this feels like one." Pt has N/V pt states she threw up once yesterday. Pt reports pain a 10/10, and face is tender in sinus area, but more on the right side. Pt states that pressing on eyes is painful.

## 2012-01-24 NOTE — ED Provider Notes (Signed)
History     CSN: [id number]  Arrival date & time 01/24/12  1132   First MD Initiated Contact with Patient 01/24/12 1340      Chief Complaint  Patient presents with  . Facial Pain    (Consider location/radiation/quality/duration/timing/severity/associated sxs/prior treatment) HPI Patient is a 46 year old female with history of rheumatoid arthritis as well as bradycardia presents today complaining of bilateral facial pain. Patient has history of bradycardia in the 50s but presents today with heart rate because as well as the 30s.  Patient reports that she frequently has some left-sided chest pain with her rheumatoid arthritis and does admit that she has had some today. She wasn't sure if this could be her heart or not. Patient does not have history of hypertension, hyperlipidemia, or diabetes. She is not a tobacco user. She has no radiation of her pain. Patient indicates that the pain she has in her face is over the maxillary sinuses. This is not particularly worse with palpation the patient reports that often is that when she has a sinus infection. Patient also reports frequent difficulties with vomiting. She mentions that she has been feeling "real sick" has not been eating and drinking.  Past Medical History  Diagnosis Date  . Allergic rhinitis, cause unspecified   . Asthma   . Chest pain, unspecified     cardiac cath 07/31/10: normal coronaries; vigorous LVF  . Atrial septal defect     s/p repair;   Echo 08/07/10: EF 55-60%; mild MR; atrial septal aneurysm; no residual ASD  . Sinoatrial node dysfunction     eval. for chronotropic competence completed in past  . Hypothyroidism   . Esophageal reflux   . Irritable bowel syndrome   . Hematuria, unspecified   . Rheumatoid arthritis   . Fibromyalgia   . Anxiety   . Anemia   . Sebaceous cyst   . Hypertension   . Low back pain   . Headache   . Scoliosis     Past Surgical History  Procedure Date  . Tubal ligation   . Asd repair      History reviewed. No pertinent family history.  History  Substance Use Topics  . Smoking status: Never Smoker   . Smokeless tobacco: Not on file  . Alcohol Use: No    OB History    Grav Para Term Preterm Abortions TAB SAB Ect Mult Living                  Review of Systems  Constitutional: Positive for appetite change and fatigue.  HENT:       Facial pain and congestion  Eyes: Negative.   Respiratory: Negative.   Cardiovascular: Positive for chest pain.  Gastrointestinal: Positive for nausea and vomiting.  Genitourinary: Negative.   Musculoskeletal: Negative.   Skin: Negative.   Neurological: Negative.   Hematological: Negative.   Psychiatric/Behavioral: Negative.   All other systems reviewed and are negative.    Allergies  Review of patient's allergies indicates no known allergies.  Home Medications   Current Outpatient Rx  Name Route Sig Dispense Refill  . ALBUTEROL SULFATE HFA 108 (90 BASE) MCG/ACT IN AERS Inhalation Inhale 2 puffs into the lungs every 6 (six) hours as needed. For shortness of breath and wheezing    . CETIRIZINE HCL 10 MG PO CHEW Oral Chew 10 mg by mouth daily.      Marland Kitchen HYDROCOD POLST-CPM POLST ER 10-8 MG/5ML PO LQCR Oral Take 5 mLs by mouth every 12 (  twelve) hours. 115 mL 0  . VITAMIN D 1000 UNITS PO CAPS Oral Take 1,000 Units by mouth daily.      Marland Kitchen LEVOTHYROXINE SODIUM 75 MCG PO TABS  TAKE 1 TABLET EVERY DAY 30 tablet 0  . CENTRUM PO Oral Take 1 tablet by mouth daily.     Marland Kitchen NAPROXEN 500 MG PO TABS Oral Take 1 tablet (500 mg total) by mouth 2 (two) times daily. 30 tablet 0  . OMEPRAZOLE 40 MG PO CPDR Oral Take 1 capsule (40 mg total) by mouth 2 (two) times daily. 180 capsule 3  . POLYETHYLENE GLYCOL 3350 PO POWD  as directed.    Marland Kitchen PSEUDOEPHEDRINE-GUAIFENESIN ER 60-600 MG PO TB12 Oral Take 1 tablet by mouth every 12 (twelve) hours.     Marland Kitchen SALINE NASAL SPRAY 0.65 % NA SOLN Nasal Place 2 sprays into the nose as needed.      Marland Kitchen TIZANIDINE HCL 4 MG  PO TABS Oral Take 4 mg by mouth every 8 (eight) hours as needed. DO NOT EXCEED 3 DOSES IN A 24 HOUR PERIOD     . TOPIRAMATE 100 MG PO TABS Oral Take 150 mg by mouth at bedtime.       BP 155/77  Pulse 38  Temp 98.6 F (37 C) (Oral)  Resp 18  Ht 5' 8.5" (1.74 m)  Wt 138 lb (62.596 kg)  BMI 20.68 kg/m2  SpO2 100%  LMP 01/01/2012  Physical Exam  Nursing note and vitals reviewed. GEN: Well-developed, well-nourished female in no distress HEENT: Atraumatic, normocephalic. Oropharynx clear without erythema. No signs of dental pathology. Patient with tenderness to palpation over the lower portion of the maxillary sinuses bilaterally. No significant facial swelling appreciated on exam. EYES: PERRLA BL, no scleral icterus. No pain with extraocular movements. NECK: Trachea midline, no meningismus CV: Bradycardic with regular rhythm. No murmurs, rubs, or gallops PULM: No respiratory distress.  No crackles, wheezes, or rales. GI: soft, non-tender. No guarding, rebound, or tenderness. + bowel sounds  GU: deferred Neuro: cranial nerves grossly 2-12 intact, no abnormalities of strength or sensation, A and O x 3 MSK: Patient moves all 4 extremities symmetrically, no deformity, edema, or injury noted Skin: No rashes petechiae, purpura, or jaundice Psych: no abnormality of mood   ED Course  Procedures (including critical care time)  .Indication: chest pain Please note this EKG was reviewed extemporaneously by myself.   Date: 01/24/2012  Rate: 46  Rhythm: sinus bradycardia  QRS Axis: normal  Intervals: normal  ST/T Wave abnormalities: nonspecific T wave changes  Conduction Disutrbances:none  Narrative Interpretation:   Old EKG Reviewed: unchanged      Labs Reviewed  CBC - Abnormal; Notable for the following:    Hemoglobin 11.8 (*)     All other components within normal limits  URINALYSIS, ROUTINE W REFLEX MICROSCOPIC - Abnormal; Notable for the following:    Bilirubin Urine SMALL  (*)     Ketones, ur >80 (*)     All other components within normal limits  COMPREHENSIVE METABOLIC PANEL  TROPONIN I  TROPONIN I   Dg Chest 2 View  01/24/2012  *RADIOLOGY REPORT*  Clinical Data: Chest pain.  CHEST - 2 VIEW  Comparison: 05/13/2011.  Findings: Stable surgical changes with median sternotomy wires. The cardiac silhouette, mediastinal and hilar contours are normal. The lungs are clear.  No pleural effusion.  The bony thorax is intact.  IMPRESSION: No acute cardiopulmonary findings.   Original Report Authenticated By: P. MARK GALLERANI,  M.D.      1. Chest pain   2. Sinusitis       MDM  Patient was evaluated by myself. Based on evaluation patient had symptoms consistent with sinusitis. She was treated with this with azithromycin as well as Solu-Medrol injection as her PCP normally gets for this. Patient was nauseous as well. She was given a dose of Zofran. Patient reported vomiting a significant amount. She did not have any signs of urinary tract infection but she did have greater than 80 ketones in her urine. She was resuscitated with a liter of D5 normal saline. Patient was feeling better after this as well as a dose of 30 mg of Toradol IV. Patient was noted to have significant bradycardia down to the 30s. She reported having is normally in the 50s. Patient is followed by Dr. Graciela Husbands of cardiology for this. Patient was given aspirin here and was worked up for chest pain as she did notice when she was asked after her bradycardia. She reported that she frequently has chest pains associated with her rheumatoid arthritis and normally wouldn't have thought much of it which is why she did not mention it. Initial workup was negative with normal chest x-ray, normal CBC, normal renal panel, and normal troponin. Patient had a repeat troponin ordered. I discussed her with Dr. Terressa Koyanagi of cardiology who was comfortable with plan for discharge the patient following up based on her bradycardia. If  patient's second troponin is negative oncoming attending we'll discharge the patient. I anticipate this will be the case. Discharge will include prescription for the remainder of azithromycin prescription as well as Zofran ODT.        Cyndra Numbers, MD 01/24/12 [phone]

## 2012-01-24 NOTE — ED Notes (Signed)
Pt states she has a hx of sinus infections and feels like this is a bad one. Draining into the back of her throat is making her nauseated. +N/V since yesterday.

## 2012-01-24 NOTE — ED Notes (Signed)
MD at bedside. 

## 2012-01-25 ENCOUNTER — Telehealth: Payer: Self-pay | Admitting: Internal Medicine

## 2012-01-25 NOTE — Telephone Encounter (Signed)
Pt called back and stated she saw nurse today and her blood pressure was 168/100, heart rate 44 and blood glucose 149. She is calling to request appt. She is feeling OK.   Appt made for her to see Norma Fredrickson, NP on Sept 10, 2013 at 11:45

## 2012-01-25 NOTE — Telephone Encounter (Signed)
Spoke with pt who reports she has wellness checks at work and heart rate when checked has been running around 40 for last couple of weeks. Pt is able to work and not having any complaints except some shortness of breath at times with activity. She was seen in ED this weekend for sinus infection. She is calling today to schedule appt with Dr. Graciela Husbands. I told pt I would forward to his nurse to contact her when back in office tomorrow to schedule appt. She is aware to call back if problems.

## 2012-01-25 NOTE — Telephone Encounter (Signed)
New problem:  C/O heart rate 38-40 . Some sob.

## 2012-01-26 ENCOUNTER — Encounter: Payer: Self-pay | Admitting: Nurse Practitioner

## 2012-01-26 ENCOUNTER — Ambulatory Visit (INDEPENDENT_AMBULATORY_CARE_PROVIDER_SITE_OTHER): Payer: BC Managed Care – PPO | Admitting: Nurse Practitioner

## 2012-01-26 VITALS — BP 140/90 | HR 42 | Ht 68.5 in | Wt 140.0 lb

## 2012-01-26 DIAGNOSIS — I495 Sick sinus syndrome: Secondary | ICD-10-CM

## 2012-01-26 NOTE — Progress Notes (Addendum)
Brittany Hodges Date of Birth: 02-Dec-1965 Medical Record #[id number]  History of Present Illness: Brittany Hodges is seen today for a post ER/work in visit. She is seen for Dr. Graciela Husbands. She is a very pleasant 45 year old black female with a history of junctional rhythm, chronic bradycardia, past ASD repair, HTN, Asthma, hypothyroidism, GERD, anemia, fibromyalgia and rheumatoid arthritis. She had had a prior Myoview study in the fall of 2011 that demonstrated an EF of 52% and mild ischemia versus attenuation artifact. It was decided to proceed with cardiac catheterization. This was performed 07/31/10 and demonstrated no CAD and normal ejection fraction. With her history of ASD repair, she was set up for an echocardiogram. This was done August 07, 2011 and demonstrated an EF of 55-60%, trace aortic insufficiency, mild mitral regurgitation, atrial septal aneurysm and no evidence of residual ASD. She has chronic bradycardia. She has had prior testing for chronotropic competence.   She comes in today. She is here alone. She has had recurrent sinusitis. She went to the ER this past weekend for the sinusitis. They were more concerned about her heart rate. Says it was down in the 30's. She is not lightheaded or dizzy. No syncope. She thinks her energy level has been ok. She has stopped exercising due to recent bout of sinus issues and migraines. BP is up today. She does not think she is taking pseudophed. She is now on antibiotics.   Current Outpatient Prescriptions on File Prior to Visit  Medication Sig Dispense Refill  . albuterol (PROAIR HFA) 108 (90 BASE) MCG/ACT inhaler Inhale 2 puffs into the lungs every 6 (six) hours as needed. For shortness of breath and wheezing      . azithromycin (ZITHROMAX) 250 MG tablet Take 1 tab po qday x 4 days.  4 each  0  . cetirizine (ZYRTEC) 10 MG chewable tablet Chew 10 mg by mouth daily.        . Cholecalciferol (VITAMIN D) 1000 UNITS capsule Take 1,000 Units by mouth daily.         . fluticasone (FLONASE) 50 MCG/ACT nasal spray Place 2 sprays into the nose daily.      Marland Kitchen levothyroxine (SYNTHROID, LEVOTHROID) 75 MCG tablet TAKE 1 TABLET EVERY DAY  30 tablet  0  . Multiple Vitamins-Minerals (CENTRUM PO) Take 1 tablet by mouth daily.       . naproxen (NAPROSYN) 500 MG tablet Take 1 tablet (500 mg total) by mouth 2 (two) times daily.  30 tablet  0  . omeprazole (PRILOSEC) 40 MG capsule Take 1 capsule (40 mg total) by mouth 2 (two) times daily.  180 capsule  3  . ondansetron (ZOFRAN ODT) 8 MG disintegrating tablet Take 1 tablet (8 mg total) by mouth every 8 (eight) hours as needed for nausea.  20 tablet  0  . polyethylene glycol powder (GLYCOLAX/MIRALAX) powder as directed.      . pseudoephedrine-guaifenesin (MUCINEX D) 60-600 MG per tablet Take 1 tablet by mouth every 12 (twelve) hours.       . sodium chloride (OCEAN) 0.65 % nasal spray Place 2 sprays into the nose as needed.        Marland Kitchen tiZANidine (ZANAFLEX) 4 MG tablet Take 4 mg by mouth every 8 (eight) hours as needed. DO NOT EXCEED 3 DOSES IN A 24 HOUR PERIOD       . topiramate (TOPAMAX) 100 MG tablet Take 150 mg by mouth at bedtime.         No  Known Allergies  Past Medical History  Diagnosis Date  . Allergic rhinitis, cause unspecified   . Asthma   . Chest pain, unspecified     cardiac cath 07/31/10: normal coronaries; vigorous LVF  . Atrial septal defect     s/p repair;   Echo 08/07/10: EF 55-60%; mild MR; atrial septal aneurysm; no residual ASD  . Sinoatrial node dysfunction     eval. for chronotropic competence completed in past  . Hypothyroidism   . Esophageal reflux   . Irritable bowel syndrome   . Hematuria, unspecified   . Rheumatoid arthritis   . Fibromyalgia   . Anxiety   . Anemia   . Sebaceous cyst   . Hypertension   . Low back pain   . Headache   . Scoliosis     Past Surgical History  Procedure Date  . Tubal ligation   . Asd repair     History  Smoking status  . Never Smoker     Smokeless tobacco  . Not on file    History  Alcohol Use No    History reviewed. No pertinent family history.  Review of Systems: The review of systems is per the HPI.  All other systems were reviewed and are negative.  Physical Exam: BP 140/90  Pulse 42  Ht 5' 8.5" (1.74 m)  Wt 140 lb (63.504 kg)  BMI 20.98 kg/m2  SpO2 99%  LMP 01/15/2012 BP was quite high at the beginning of the visit but down to 140/90 in both arms by me.  Patient is very pleasant and in no acute distress. Skin is warm and dry. Color is normal.  HEENT is unremarkable. Normocephalic/atraumatic. PERRL. Sclera are nonicteric. Neck is supple. No masses. No JVD. Lungs are clear. Cardiac exam shows a regular rate and rhythm. Her rate is slow. Abdomen is soft. Extremities are without edema. Gait and ROM are intact. No gross neurologic deficits noted.   LABORATORY DATA: EKG today shows marked sinus bradycardia. Rate is 42. PR is .12   Lab Results  Component Value Date   WBC 6.1 01/24/2012   HGB 11.8* 01/24/2012   HCT 36.3 01/24/2012   PLT 181 01/24/2012   GLUCOSE 82 01/24/2012   CHOL 141 04/10/2011   TRIG 49.0 04/10/2011   HDL 56.00 04/10/2011   LDLCALC 75 04/10/2011   ALT 13 01/24/2012   AST 25 01/24/2012   NA 136 01/24/2012   K 3.8 01/24/2012   CL 100 01/24/2012   CREATININE 0.70 01/24/2012   BUN 14 01/24/2012   CO2 22 01/24/2012   TSH 4.02 04/10/2011   INR 1.0 07/29/2010   Assessment / Plan:  1. Bradycardia - this is a chronic finding. I do not get a sense that her symptoms have changed. We will go ahead and obtain a GXT to look at her chronotropic competence.   2. HTN - Repeat BP by me is improved. I have asked her to monitor at home. Will review her readings when we see her back. She has been on BP meds in the past. I have asked her to make sure she is not on pseudophed agents. She will check when she gets home. Also asked her to avoid NSAIDS.  3. Sinusitis - on treatment  4. ASD repair - last echo was in March  2012  Her activities are to be as tolerated. We will update her GXT. Tentatively see Dr. Graciela Husbands back in April as scheduled. For now, it would appear that PTVP is  not indicated.   Patient is agreeable to this plan and will call if any problems develop in the interim.    Addendum: Patient presented on 02/15/12 for her GXT. Blood pressure readings are reviewed and are very well controlled at home.

## 2012-01-26 NOTE — Patient Instructions (Addendum)
Make sure you are not taking Mucinex D but PLAIN mucinex instead.  Monitor your blood pressure at home. Let us know if your readings are above 135/85  We will arrange for a treadmill test to see what your heart rate does with exercise  Call the [facility] office at [phone] if you have any questions, problems or concerns.

## 2012-02-02 NOTE — Addendum Note (Signed)
Addended by: Vista Mink D on: 02/02/2012 11:00 AM   Modules accepted: Orders

## 2012-02-15 ENCOUNTER — Ambulatory Visit (INDEPENDENT_AMBULATORY_CARE_PROVIDER_SITE_OTHER): Payer: BC Managed Care – PPO | Admitting: Physician Assistant

## 2012-02-15 ENCOUNTER — Encounter: Payer: Self-pay | Admitting: Physician Assistant

## 2012-02-15 DIAGNOSIS — I495 Sick sinus syndrome: Secondary | ICD-10-CM

## 2012-02-15 NOTE — Procedures (Signed)
Exercise Treadmill Test  Pre-Exercise Testing Evaluation Rhythm: sinus bradycardia  Rate: 44   PR:  .15 QRS:  .07  QT:  .46 QTc: .39     Test  Exercise Tolerance Test Ordering MD: Sherryl Manges, MD  Interpreting MD: Tereso Newcomer PA-C  Unique Test No: 1  Treadmill:  1  Indication for ETT: Bradycardia  Contraindication to ETT: No   Stress Modality: exercise - treadmill  Cardiac Imaging Performed: non   Protocol: standard Bruce - maximal  Max BP:  181/98  Max MPHR (bpm):  172 85% MPR (bpm):  146  MPHR obtained (bpm):  155 % MPHR obtained:  90%  Reached 85% MPHR (min:sec):  6:50 Total Exercise Time (min-sec):  9:00  Workload in METS:  10.1 Borg Scale: 13  Reason ETT Terminated:  patient's desire to stop    ST Segment Analysis At Rest: normal ST segments - no evidence of significant ST depression With Exercise: no evidence of significant ST depression  Other Information Arrhythmia:  Yes Angina during ETT:  absent (0) Quality of ETT:  diagnostic  ETT Interpretation:  normal - no evidence of ischemia by ST analysis  Comments: Good exercise tolerance. No chest pain. Normal BP response to exercise. No ST-T changes to suggest ischemia.  Adequate HR response to exercise. Patient did have short run of SVT in recovery (~ 15 beats). Also, rare episode of non-conducted p wave in recovery.  Recommendations: Follow up with Dr. Sherryl Manges as recommended. Tereso Newcomer, PA-C  3:43 PM 02/15/2012

## 2012-02-23 ENCOUNTER — Ambulatory Visit: Payer: BC Managed Care – PPO | Admitting: Pulmonary Disease

## 2012-03-11 ENCOUNTER — Telehealth: Payer: Self-pay | Admitting: Pulmonary Disease

## 2012-03-11 NOTE — Telephone Encounter (Signed)
Spoke with pt. She has upcoming ov with SN on 03/17/12 and would like to know if she needs to come sooner for fasting labs. She is aware that SN is back in the office again on 03/14/12 and this will will answered after that date. She verbalized understanding and states nothing further needed. Please advise, thanks!

## 2012-03-14 ENCOUNTER — Other Ambulatory Visit: Payer: Self-pay | Admitting: Pulmonary Disease

## 2012-03-14 DIAGNOSIS — E039 Hypothyroidism, unspecified: Secondary | ICD-10-CM

## 2012-03-14 DIAGNOSIS — D649 Anemia, unspecified: Secondary | ICD-10-CM

## 2012-03-14 DIAGNOSIS — E7800 Pure hypercholesterolemia, unspecified: Secondary | ICD-10-CM

## 2012-03-14 NOTE — Telephone Encounter (Signed)
Per SN---ok to come in for fasting labs.  Order have already been placed.  thanks

## 2012-03-14 NOTE — Telephone Encounter (Signed)
Spoke with pt and notified that lab orders in the system and come fasting prior to appt to have this done. Pt verbalized understanding and states nothing further needed.

## 2012-03-16 ENCOUNTER — Other Ambulatory Visit (INDEPENDENT_AMBULATORY_CARE_PROVIDER_SITE_OTHER): Payer: BC Managed Care – PPO

## 2012-03-16 ENCOUNTER — Encounter: Payer: Self-pay | Admitting: *Deleted

## 2012-03-16 DIAGNOSIS — E7800 Pure hypercholesterolemia, unspecified: Secondary | ICD-10-CM

## 2012-03-16 DIAGNOSIS — E039 Hypothyroidism, unspecified: Secondary | ICD-10-CM

## 2012-03-16 DIAGNOSIS — D649 Anemia, unspecified: Secondary | ICD-10-CM

## 2012-03-16 LAB — CBC WITH DIFFERENTIAL/PLATELET
Basophils Absolute: 0 10*3/uL (ref 0.0–0.1)
Basophils Relative: 0.3 % (ref 0.0–3.0)
Eosinophils Absolute: 0 10*3/uL (ref 0.0–0.7)
Eosinophils Relative: 0.3 % (ref 0.0–5.0)
HCT: 41.3 % (ref 36.0–46.0)
Hemoglobin: 13.1 g/dL (ref 12.0–15.0)
Lymphocytes Relative: 27.1 % (ref 12.0–46.0)
Lymphs Abs: 1.7 10*3/uL (ref 0.7–4.0)
MCHC: 31.7 g/dL (ref 30.0–36.0)
MCV: 87.1 fl (ref 78.0–100.0)
Monocytes Absolute: 0.8 10*3/uL (ref 0.1–1.0)
Monocytes Relative: 12.4 % — ABNORMAL HIGH (ref 3.0–12.0)
Neutro Abs: 3.7 10*3/uL (ref 1.4–7.7)
Neutrophils Relative %: 59.9 % (ref 43.0–77.0)
Platelets: 216 10*3/uL (ref 150.0–400.0)
RBC: 4.74 Mil/uL (ref 3.87–5.11)
RDW: 16.7 % — ABNORMAL HIGH (ref 11.5–14.6)
WBC: 6.2 10*3/uL (ref 4.5–10.5)

## 2012-03-16 LAB — LIPID PANEL
Cholesterol: 171 mg/dL (ref 0–200)
HDL: 69.5 mg/dL (ref >39.00)
LDL Cholesterol: 92 mg/dL (ref 0–99)
Total CHOL/HDL Ratio: 2
Triglycerides: 49 mg/dL (ref 0.0–149.0)
VLDL: 9.8 mg/dL (ref 0.0–40.0)

## 2012-03-16 LAB — TSH: TSH: 5.16 u[IU]/mL (ref 0.35–5.50)

## 2012-03-17 ENCOUNTER — Encounter: Payer: Self-pay | Admitting: Pulmonary Disease

## 2012-03-17 ENCOUNTER — Ambulatory Visit (INDEPENDENT_AMBULATORY_CARE_PROVIDER_SITE_OTHER): Payer: BC Managed Care – PPO | Admitting: Pulmonary Disease

## 2012-03-17 VITALS — BP 122/84 | HR 46 | Temp 97.7°F | Ht 68.5 in | Wt 138.0 lb

## 2012-03-17 DIAGNOSIS — Z23 Encounter for immunization: Secondary | ICD-10-CM

## 2012-03-17 DIAGNOSIS — E039 Hypothyroidism, unspecified: Secondary | ICD-10-CM

## 2012-03-17 DIAGNOSIS — K5900 Constipation, unspecified: Secondary | ICD-10-CM

## 2012-03-17 DIAGNOSIS — IMO0001 Reserved for inherently not codable concepts without codable children: Secondary | ICD-10-CM

## 2012-03-17 DIAGNOSIS — R51 Headache: Secondary | ICD-10-CM

## 2012-03-17 DIAGNOSIS — F411 Generalized anxiety disorder: Secondary | ICD-10-CM

## 2012-03-17 DIAGNOSIS — M545 Low back pain, unspecified: Secondary | ICD-10-CM

## 2012-03-17 DIAGNOSIS — M069 Rheumatoid arthritis, unspecified: Secondary | ICD-10-CM

## 2012-03-17 DIAGNOSIS — I495 Sick sinus syndrome: Secondary | ICD-10-CM

## 2012-03-17 DIAGNOSIS — K589 Irritable bowel syndrome without diarrhea: Secondary | ICD-10-CM

## 2012-03-17 DIAGNOSIS — I10 Essential (primary) hypertension: Secondary | ICD-10-CM

## 2012-03-17 DIAGNOSIS — K219 Gastro-esophageal reflux disease without esophagitis: Secondary | ICD-10-CM
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
## 2012-03-17 NOTE — Patient Instructions (Addendum)
Today we updated your med list in our EPIC system...    Continue your current medications the same...  Today we gave you the 2013 Flu vaccine...  We also gave you a print out of your recent labs for your records...  Call for any questions...  Let's plan a follow up visit in 6 months time.Marland KitchenMarland Kitchen

## 2012-05-20 ENCOUNTER — Telehealth: Payer: Self-pay | Admitting: Pulmonary Disease

## 2012-05-20 ENCOUNTER — Other Ambulatory Visit: Payer: Self-pay | Admitting: Pulmonary Disease

## 2012-05-20 MED ORDER — CETIRIZINE HCL 10 MG PO TABS: 10.0000 mg | ORAL_TABLET | Freq: Every day | ORAL | Status: DC

## 2012-05-20 MED ORDER — SALINE NASAL SPRAY 0.65 % NA SOLN: 2.0000 | NASAL | Status: DC | PRN

## 2012-05-20 MED ORDER — GUAIFENESIN ER 600 MG PO TB12: 1200.0000 mg | ORAL_TABLET | Freq: Two times a day (BID) | ORAL | Status: DC

## 2012-05-20 NOTE — Telephone Encounter (Signed)
Called, spoke with pt.  She has a flexable spending account through work.  She will need rxs for zyrtec, mucinex, and saline ocean spray in order for the meds to be covered under the flexible spending account.  All of these meds are on her current med list.  Rxs sent to [facility] on [address].  Pt aware and voiced no further questions or concerns at this time.

## 2012-06-20 ENCOUNTER — Telehealth: Payer: Self-pay | Admitting: Pulmonary Disease

## 2012-06-20 NOTE — Telephone Encounter (Signed)
Routing to Dr Kriste Basque

## 2012-06-20 NOTE — Telephone Encounter (Signed)
Last OV 02-2012. Spoke with the pt and she is c/o starting with a migraine on Thursday that has not really gone away. She states she also now has a lot of sinus pressure and pain. She states she has a lot of sinus drainage at night. She also has a productive cough with yellow phlegm. Pt denies fever but states she has had chills. Pt also states that she has had some vomiting over the last 24 hours as well. Please advise. [name], CMA No Known Allergies

## 2012-06-20 NOTE — Telephone Encounter (Signed)
Per SN--ok to send in augmentin 875 mg  #14  1 po bid, mucinex 2 po bid, fluids, nasal saline spray, medrol dosepak  #1  Take as directed.  thanks

## 2012-06-20 NOTE — Telephone Encounter (Signed)
lmomtcb -- Where does pt want rx sent?

## 2012-06-21 MED ORDER — AMOXICILLIN-POT CLAVULANATE 875-125 MG PO TABS: 1.0000 | ORAL_TABLET | Freq: Two times a day (BID) | ORAL | Status: DC

## 2012-06-21 MED ORDER — METHYLPREDNISOLONE 4 MG PO KIT: PACK | ORAL | Status: DC

## 2012-06-21 NOTE — Telephone Encounter (Signed)
The pt is aware of SN recs and prescriptions have been sent to her pharmacy.

## 2012-09-02 ENCOUNTER — Encounter (HOSPITAL_BASED_OUTPATIENT_CLINIC_OR_DEPARTMENT_OTHER): Payer: Self-pay | Admitting: *Deleted

## 2012-09-02 DIAGNOSIS — Z8679 Personal history of other diseases of the circulatory system: Secondary | ICD-10-CM | POA: Insufficient documentation

## 2012-09-02 DIAGNOSIS — J45909 Unspecified asthma, uncomplicated: Secondary | ICD-10-CM | POA: Insufficient documentation

## 2012-09-02 DIAGNOSIS — Z8739 Personal history of other diseases of the musculoskeletal system and connective tissue: Secondary | ICD-10-CM | POA: Insufficient documentation

## 2012-09-02 DIAGNOSIS — I10 Essential (primary) hypertension: Secondary | ICD-10-CM | POA: Insufficient documentation

## 2012-09-02 DIAGNOSIS — E039 Hypothyroidism, unspecified: Secondary | ICD-10-CM | POA: Insufficient documentation

## 2012-09-02 DIAGNOSIS — K219 Gastro-esophageal reflux disease without esophagitis: Secondary | ICD-10-CM | POA: Insufficient documentation

## 2012-09-02 DIAGNOSIS — Z8659 Personal history of other mental and behavioral disorders: Secondary | ICD-10-CM | POA: Insufficient documentation

## 2012-09-02 DIAGNOSIS — Z862 Personal history of diseases of the blood and blood-forming organs and certain disorders involving the immune mechanism: Secondary | ICD-10-CM | POA: Insufficient documentation

## 2012-09-02 DIAGNOSIS — IMO0002 Reserved for concepts with insufficient information to code with codable children: Secondary | ICD-10-CM | POA: Insufficient documentation

## 2012-09-02 DIAGNOSIS — G43909 Migraine, unspecified, not intractable, without status migrainosus: Secondary | ICD-10-CM | POA: Insufficient documentation

## 2012-09-02 DIAGNOSIS — Z872 Personal history of diseases of the skin and subcutaneous tissue: Secondary | ICD-10-CM | POA: Insufficient documentation

## 2012-09-02 DIAGNOSIS — Z8719 Personal history of other diseases of the digestive system: Secondary | ICD-10-CM | POA: Insufficient documentation

## 2012-09-02 DIAGNOSIS — Z79899 Other long term (current) drug therapy: Secondary | ICD-10-CM | POA: Insufficient documentation

## 2012-09-02 NOTE — ED Notes (Signed)
Pt reports mha since wed- vomited x 3 today

## 2012-09-03 ENCOUNTER — Emergency Department (HOSPITAL_BASED_OUTPATIENT_CLINIC_OR_DEPARTMENT_OTHER)
Admission: EM | Admit: 2012-09-03 | Discharge: 2012-09-03 | Disposition: A | Discharge status: Home / Self Care | Payer: BC Managed Care – PPO | Attending: Emergency Medicine | Admitting: Emergency Medicine

## 2012-09-03 DIAGNOSIS — G43909 Migraine, unspecified, not intractable, without status migrainosus: Secondary | ICD-10-CM

## 2012-09-03 MED ORDER — SODIUM CHLORIDE 0.9 % IV BOLUS (SEPSIS)
1000.0000 mL | Freq: Once | INTRAVENOUS | Status: AC
  Administered 2012-09-03: 1000 mL via INTRAVENOUS

## 2012-09-03 MED ORDER — DIPHENHYDRAMINE HCL 50 MG/ML IJ SOLN
25.0000 mg | Freq: Once | INTRAMUSCULAR | Status: AC
  Administered 2012-09-03: 25 mg via INTRAVENOUS
  Filled 2012-09-03: qty 1

## 2012-09-03 MED ORDER — KETOROLAC TROMETHAMINE 30 MG/ML IJ SOLN
30.0000 mg | Freq: Once | INTRAMUSCULAR | Status: AC
  Administered 2012-09-03: 30 mg via INTRAVENOUS
  Filled 2012-09-03: qty 1

## 2012-09-03 MED ORDER — METOCLOPRAMIDE HCL 5 MG/ML IJ SOLN
10.0000 mg | Freq: Once | INTRAMUSCULAR | Status: AC
  Administered 2012-09-03: 10 mg via INTRAMUSCULAR
  Filled 2012-09-03: qty 2

## 2012-09-03 MED ORDER — DEXAMETHASONE SODIUM PHOSPHATE 10 MG/ML IJ SOLN
10.0000 mg | Freq: Once | INTRAMUSCULAR | Status: AC
  Administered 2012-09-03: 10 mg via INTRAVENOUS
  Filled 2012-09-03: qty 1

## 2012-09-03 NOTE — ED Provider Notes (Signed)
History     CSN: [id number]  Arrival date & time 09/02/12  2307   First MD Initiated Contact with Patient 09/03/12 0141      Chief Complaint  Patient presents with  . Migraine    (Consider location/radiation/quality/duration/timing/severity/associated sxs/prior treatment) HPI This is a 47 year old female with a history of migraine headaches. She is here with a headache that began 3 days ago. The pain is located in the occiput but also causes discomfort behind her eyes. She describes the pain as moderate to severe. It is throbbing and similar to previous headaches. She has taken Zanaflex without relief. Yesterday she began having nausea and vomiting with it and cannot keep anything on her stomach. She has mild photophobia. There is no focal neurologic deficit.  Past Medical History  Diagnosis Date  . Allergic rhinitis, cause unspecified   . Asthma   . Chest pain, unspecified     cardiac cath 07/31/10: normal coronaries; vigorous LVF  . Atrial septal defect     s/p repair;   Echo 08/07/10: EF 55-60%; mild MR; atrial septal aneurysm; no residual ASD  . Sinoatrial node dysfunction     eval. for chronotropic competence completed in past  . Hypothyroidism   . Esophageal reflux   . Irritable bowel syndrome   . Hematuria, unspecified   . Rheumatoid arthritis   . Fibromyalgia   . Anxiety   . Anemia   . Sebaceous cyst   . Hypertension   . Low back pain   . Headache   . Scoliosis   . Bradycardia     Past Surgical History  Procedure Laterality Date  . Tubal ligation    . Asd repair      No family history on file.  History  Substance Use Topics  . Smoking status: Never Smoker   . Smokeless tobacco: Never Used  . Alcohol Use: No    OB History   Grav Para Term Preterm Abortions TAB SAB Ect Mult Living                  Review of Systems  All other systems reviewed and are negative.    Allergies  Review of patient's allergies indicates no known allergies.  Home  Medications   Current Outpatient Rx  Name  Route  Sig  Dispense  Refill  . albuterol (PROAIR HFA) 108 (90 BASE) MCG/ACT inhaler   Inhalation   Inhale 2 puffs into the lungs every 6 (six) hours as needed. For shortness of breath and wheezing         . amoxicillin (AMOXIL) 500 MG capsule   Oral   Take 500 mg by mouth as needed (before dental procedures).         . cetirizine (ZYRTEC ALLERGY) 10 MG tablet   Oral   Take 1 tablet (10 mg total) by mouth daily.   30 tablet   6   . Cholecalciferol (VITAMIN D) 1000 UNITS capsule   Oral   Take 1,000 Units by mouth daily.           . fluticasone (FLONASE) 50 MCG/ACT nasal spray   Nasal   Place 2 sprays into the nose daily.         Marland Kitchen guaiFENesin (MUCINEX) 600 MG 12 hr tablet   Oral   Take 2 tablets (1,200 mg total) by mouth 2 (two) times daily.   60 tablet   6   . levothyroxine (SYNTHROID, LEVOTHROID) 75 MCG tablet  TAKE 1 TABLET EVERY DAY   30 tablet   0   . methylPREDNISolone (MEDROL, PAK,) 4 MG tablet      follow package directions   21 tablet   0   . Multiple Vitamins-Minerals (CENTRUM PO)   Oral   Take 1 tablet by mouth daily.          Marland Kitchen omeprazole (PRILOSEC) 40 MG capsule      TAKE 1 CAPSULE TWICE A DAY   180 capsule   2   . polyethylene glycol powder (GLYCOLAX/MIRALAX) powder      as directed.         . promethazine (PHENERGAN) 12.5 MG tablet   Oral   Take 12.5 mg by mouth every 6 (six) hours as needed for nausea.         . sodium chloride (OCEAN) 0.65 % nasal spray   Nasal   Place 2 sprays into the nose as needed.   30 mL   6   . tiZANidine (ZANAFLEX) 4 MG tablet   Oral   Take 4 mg by mouth every 8 (eight) hours as needed. DO NOT EXCEED 3 DOSES IN A 24 HOUR PERIOD          . topiramate (TOPAMAX) 100 MG tablet   Oral   Take 150 mg by mouth at bedtime.          Marland Kitchen amoxicillin-clavulanate (AUGMENTIN) 875-125 MG per tablet   Oral   Take 1 tablet by mouth 2 (two) times daily.    14 tablet   0     BP 148/81  Pulse 42  Temp(Src) 98.8 F (37.1 C) (Oral)  Resp 18  Ht 5\' 8"  (1.727 m)  Wt 135 lb (61.236 kg)  BMI 20.53 kg/m2  SpO2 97%  LMP 08/29/2012  Physical Exam General: Well-developed, well-nourished female in no acute distress; appearance consistent with age of record HENT: normocephalic, atraumatic Eyes: pupils equal round and reactive to light; extraocular muscles intact Neck: supple Heart: regular rate and rhythm; bradycardia; no murmur heard Lungs: clear to auscultation bilaterally Abdomen: soft; nondistended; nontender; bowel sounds present Extremities: No deformity; full range of motion Neurologic: Awake, alert and oriented; motor function intact in all extremities and symmetric; no facial droop; normal coordination and speech Skin: Warm and dry Psychiatric: Normal mood and affect    ED Course  Procedures (including critical care time)      MDM  3:25 AM Patient feels better after IV fluid bolus and IV medications. She states she is ready to go home at this time.        Hanley Seamen, MD 09/03/12 [phone]

## 2012-09-03 NOTE — ED Notes (Signed)
Pt sts she feels much better and feels like she is ready to go home.

## 2012-09-05 ENCOUNTER — Telehealth: Payer: Self-pay | Admitting: Internal Medicine

## 2012-09-05 NOTE — Telephone Encounter (Signed)
New problem  Pt is having pain in collar bone,front of chest and when pt takes a deep breath it hurts. Please call pt.

## 2012-09-05 NOTE — Telephone Encounter (Signed)
Spoke with patient who c/o chest pain at the area of the collar bone that radiates into her back when she takes a deep breath that began yesterday.  States it is not reproducable but she only feels it when she takes a deep breath. Patient denies SOB, fever; c/o fatigue.  Patient states she has not been using her inhaler because she has not felt like she needed it.  States she has noticed some sinus drainage and ear pressure in the last few weeks.  Patient also has hx of arthritis and states she began having arm pain yesterday. Patient was treated over the weekend for a migraine. BP 144/85, HR 56.  Patient has hx bradycardia Advised patient that at this time the s/s she is describing do not sound cardiac in nature.  Advised patient that if she feels chest pain w/out deep breathing or if she experiences pain that radiates into jaw or arms or has diaphoresis/nausea with the pain to call 911.  Also advised patient that she could call us back at any time if she is concerned about s/s.  Patient verbalized understanding and agreement with plan of care.

## 2012-09-08 ENCOUNTER — Telehealth: Payer: Self-pay | Admitting: Adult Health

## 2012-09-08 ENCOUNTER — Telehealth: Payer: Self-pay | Admitting: Pulmonary Disease

## 2012-09-08 NOTE — Telephone Encounter (Signed)
Pt returned triage's call.  Holly D Pryor ° °

## 2012-09-08 NOTE — Telephone Encounter (Signed)
Pt's appointment has been moved from 9:15am to 3:45pm tomorrow (09/09/12). Nothing further was needed.

## 2012-09-08 NOTE — Telephone Encounter (Signed)
Spoke to pt. We don't have any available appointments for today. She is fine with being seen tomorrow. Has been scheduled to see TP 09/09/12 @ 9:15am.  Nothing further was needed.

## 2012-09-08 NOTE — Telephone Encounter (Signed)
Called cell number, NA, no option to leave voicemail. [id number] on work #. [name], CMA

## 2012-09-09 ENCOUNTER — Ambulatory Visit (INDEPENDENT_AMBULATORY_CARE_PROVIDER_SITE_OTHER): Payer: BC Managed Care – PPO | Admitting: Adult Health

## 2012-09-09 ENCOUNTER — Ambulatory Visit (INDEPENDENT_AMBULATORY_CARE_PROVIDER_SITE_OTHER)
Admission: RE | Admit: 2012-09-09 | Discharge: 2012-09-09 | Disposition: A | Discharge status: Home / Self Care | Payer: BC Managed Care – PPO | Source: Ambulatory Visit | Attending: Adult Health | Admitting: Adult Health

## 2012-09-09 ENCOUNTER — Ambulatory Visit: Payer: BC Managed Care – PPO | Admitting: Adult Health

## 2012-09-09 ENCOUNTER — Encounter: Payer: Self-pay | Admitting: Adult Health

## 2012-09-09 VITALS — BP 104/80 | HR 67 | Temp 97.3°F | Ht 68.5 in | Wt 136.2 lb

## 2012-09-09 DIAGNOSIS — J45909 Unspecified asthma, uncomplicated: Secondary | ICD-10-CM

## 2012-09-09 DIAGNOSIS — J309 Allergic rhinitis, unspecified: Secondary | ICD-10-CM

## 2012-09-09 DIAGNOSIS — R0789 Other chest pain: Secondary | ICD-10-CM

## 2012-09-09 DIAGNOSIS — R071 Chest pain on breathing: Secondary | ICD-10-CM

## 2012-09-09 MED ORDER — MELOXICAM 15 MG PO TABS: 15.0000 mg | ORAL_TABLET | Freq: Every day | ORAL | Status: DC | PRN

## 2012-09-09 NOTE — Patient Instructions (Addendum)
Mobic 15mg  daily for 1 week then As needed  Joint pain.  Alternate ice and heat to shoulder and chest wall.  Zyrtec 10mg  At bedtime   Saline nasal rinses As needed   Dymista 1 puff Twice daily  Until sample is finished.  I will call with chest xray results.  Please contact office for sooner follow up if symptoms do not improve or worsen or seek emergency care  follow up Dr. Kriste Basque  As planned next week and . As needed

## 2012-09-09 NOTE — Assessment & Plan Note (Signed)
Chest wall pain w/ shoulder tendernes ? Musculoskeletal in nature.  No exertional syptoms or dyspnea, does not appear to be cardiac  Check cxr   Plan  Mobic 15mg  daily for 1 week then As needed  Joint pain.  Alternate ice and heat to shoulder and chest wall.  I will call with chest xray results.  Please contact office for sooner follow up if symptoms do not improve or worsen or seek emergency care  follow up Dr. Kriste Basque  As planned next week and . As needed

## 2012-09-09 NOTE — Progress Notes (Signed)
Subjective:    Patient ID: Brittany Hodges, female    DOB: 1965-07-15, 47 y.o.   MRN: [id number]  HPI 47 y/o BF  With known hx of AR & Asthma;  HBP;  Hx CP & ASD repair;  Hypothyroid;  GERD/ IBS/ constipation;  hx hematuria evaluated by Thermon Leyland;  RA/ FM/ LBP follwed by DrDeveshwar;  Chronic headaches/ Migraines followed by DrFreeman;  Anxiety...   09/09/2012 Acute OV  Complains of sinus congesiton , drainage , migraine, pain on inspiration x 1 week  Has been having difficulty with runny nose, ear congestion , sinus pressure. Clear drainage. No fever or discolored mucus. Having MHA , went to ER for pain management.  No visual/speech changes.  Complains of pain along left shoulder , sore to move around , and pain on deep breath along left upper chest wall and upper left side of back . No hemoptysis, calf pain  Or exertional chest pain . Feels off balance and fullness in ears.  No arm weakness or dysphagia.                Problem List:   As noted> she has mult specialists tending to ALL her medical problems...  ALLERGIC RHINITIS (ICD-477.9) - on OTC Antihist (eg- ZYRTEK) Qam; Saline nasal sp every 1-2 H during the day + MUCINEX 2Bid; & FLONASE 2spQhs... rec to use breathe right nasal strips at bedtime Prn to help w/ drainage... she knows to avoid Afrin due to BP. ~  9/11:  She reports that recent ENT eval in [place] showed reflux related ENT symptoms & Rx w/ Dexilant> ch to OMEPRAZOLE 40mg Bid. ~  9/12:  She reports recent ENT eval in [place] for "sinus pressure" & CT scan showed essentially neg w/o acute sinusitis & min chronic changes... ~  9/13:  She went to the ER w/ facial pain over her sinuses; ER eval showed tender over max areas, CXR was clear, no sinus films done, diagnosed w/ sinusitis & treated w/ ZPak & Depo shot (all symptoms resolved & back to baseline).  ASTHMA (ICD-493.90) -  uses PROAIR as needed... denies cough, sputum, hemoptysis, worsening dyspnea, wheezing, chest pains, snoring,  daytime hypersomnolence, etc... On the OMEP Bid & antireflux regimen to help nocturnal symptoms... ~  CXR 8/09 showed prev median sternotomy, heart norm size, lungs clear, +scoliosis... ~  CXR 11/12 showed heart size normal, lungs clear, mild scoliosis, NAD.Marland Kitchen. ~  CXR 9/13 revealed s/p median sternotomy, normal heart size, clear lungs, NAD...  HYPERTENSION (ICD-401.9) - controlled on low salt diet alone (prev on Norvasc & avoiding meds due to her bradycardia)...  ~  9/12: BP= 110/62 today & doing satis w/o medication; denies visual changes, CP, palipit, dizziness, syncope, dyspnea, edema, etc...  ~  10/12: BP= 118/78 & stable- remains asymptomatic x for her mult somatic complaints... ~  10/13:  BP= 122/84 & she denies current CP, palpit, SOB, edema, etc..  Hx of CHEST PAIN (ICD-786.50) >> most of it is AtypCWP related to her FM... Hx of ATRIAL SEPTAL DEFECT (ICD-745.5) - S/P ASD repair 1985> She is followed by DrStuckey & DrKlein... SA NODE DYSFUNCTION >> ~  EKG shows junctional bradycardia & NSSTTWA... ~  2DEcho 7/06 showed tr MR & TR, normal LVF... ~  Followed by DrKlein for hx SA node dysfunction/ junctional rhythm in the setting of prev ASD repair- doing OK/ stable rhythm/ good exerc capacity. ~  She saw DrKlein 9/11 for CP & Bradycardia (junctional brady)- not on BP/vasoactive meds, heart  rate in the 40's> she tells me they are trying to hold off on pacemaker as long as poss. ~  Myoview 10/11 was abnormal w/ mild revers defect in ant wall, mild LV dil & EF= 52%... ~  persistant vs recurrent CP 3/12 lead to Cath by DrStuckey> norm coronaries w/ min luminal irregularities, well preserved LVF... ~  2DEcho 4/12 showed norm LV size & funct w/ EF=55-60%, no regional wall motion abn, mildMR, trivAI, +atrial septal aneurysm ~  9/12: f/u DrKlein & stable, no changes made... ~  She had f/u DrKlein 4/13> Hx junctional rhythm in the setting of ASD repair; Myoview 2011 showed attenuation vs mild  ischemia, EF=52% & min LVdil; exercising at gym 3-4d per week, has non-exertional chest discomfort, no changes made...  ~  9/13:  Cards ordered Treadmill to check for chronotropic competence> good exerc tol, no CP, normal BP response, no ischemia & good HR response to exercise; no change in meds (not on any cardiac meds & BP= 122/84 on diet alone).  HYPOTHYROIDISM (ICD-244.9) - on SYNTHROID 87mcg/d> prev on Synthroid 74mcg/d per DrKerr, but she was out of meds for over a yr when she returned here 9/11... ~  TSH here in 2007 = 1.32 ~  9/09: note from DrKerr reviewed- she had been off Synthroid & TSH was 13.14 w/ Synthroid restarted. ~  labs here 9/11 off meds showed TSH= 7.79... rec> start SYNTHROID 72mcg/d. ~  labs 11/11 on Levo75 showed TSH= 4.67 ~  Labs 3/12 on Levo75 showed TSH= 1.53 ~  Labs 11/12 on Levo75 showed TSH= 4.02 ~  Labs 10/13 on Levo75 showed TSH= 5/19 & reminded to take every day 1st thing in the AM on empty stomach...  GERD (ICD-530.81) - on OMEPRAZOLE 40mg Bid IRRITABLE BOWEL SYNDROME (ICD-564.1)   << GI - followed by DrWalker in [place]  >> CONSTIPATION (ICD-564.00) ~  12/12: she had colonoscopy by DrMShearin in [place]> neg colon w/o polyps, divertics, etc; felt to have IBS-c...  HEMATURIA UNSPECIFIED (ICD-599.70) - she developed gross hematuria 9/11- neg culture; f/u showed microhematuria & referred to Urology;  seen by [facility] 10/11 & his UA was neg- Renal Sonar neg for any lesions or stones & Cysto was neg as well (DrNesi continues to follow pt).  RHEUMATOID ARTHRITIS (ICD-714.0)   <<  Rheum -  followed by DrDeveshwar  >> FIBROMYALGIA (ICD-729.1)                  prev Rx> Pred, MTX, Humira, Enbrel - off all meds since 2010 LOW BACK PAIN (ICD-724.2) ~  she has a hx of RA and Fibromyalgia and was followed by DrDeveshwar for Rheumatology- prev on Prednisone, Plaquenil, Lyrica, Imipramine, Flexeril, Tramadol, and Vicodin... ~  10/13:  She reports that DrDeveshwar is Paediatric nurse for [employer] to begin for her RA... Currently taking NAPROSYN 500mg  Bid as needed...  HEADACHES >> Hx migraine HAs and mixed muscle contraction HAs in the past;  Treated by [facility]Freeman[facility] w/ TOPAMAX 150mg /d & Zanaflex 4mg  Tid prn;  He has also given her shots in the neck which really helped in the past... ~  9/12: pt reports f/u eval DrFreeman w/ trigger point injections and incr Topamax to 150mg /d; she states improved...  ANXIETY (ICD-300.00) - not currently taking medication.  ANEMIA (ICD-285.9) ~  labs 7/09 from [place] showed Hg= 12.4 ~  labs 9/11 here showed Hg= 13.0 ~  Labs 3/12 showed Hg= 12.8 ~  Labs 10/13 showed Hg= 13.1  SEBACEOUS CYST, SCALP (ICD-706.2) - this resolved w/ Keflex... she has a hx of MRSA exposure from her child and had a MRSA buttock abscess drained by Trecia Rogers 9/08...   Past Surgical History  Procedure Laterality Date  . Tubal ligation    . Asd repair      Outpatient Encounter Prescriptions as of 09/09/2012  Medication Sig Dispense Refill  . adalimumab (HUMIRA PEN) 40 MG/0.8ML injection Inject 40 mg into the skin once.      Marland Kitchen albuterol (PROAIR HFA) 108 (90 BASE) MCG/ACT inhaler Inhale 2 puffs into the lungs every 6 (six) hours as needed. For shortness of breath and wheezing      . amoxicillin (AMOXIL) 500 MG capsule Take 500 mg by mouth as needed (before dental procedures).      . cetirizine (ZYRTEC ALLERGY) 10 MG tablet Take 1 tablet (10 mg total) by mouth daily.  30 tablet  6  . Cholecalciferol (VITAMIN D) 1000 UNITS capsule Take 1,000 Units by mouth daily.        . fluticasone (FLONASE) 50 MCG/ACT nasal spray Place 2 sprays into the nose daily.      Marland Kitchen guaiFENesin (MUCINEX) 600 MG 12 hr tablet Take 2 tablets (1,200 mg total) by mouth 2 (two) times daily.  60 tablet  6  . levothyroxine (SYNTHROID, LEVOTHROID) 75 MCG tablet TAKE 1 TABLET EVERY DAY  30 tablet  0  . Multiple Vitamins-Minerals (CENTRUM PO) Take 1 tablet by mouth daily.        Marland Kitchen omeprazole (PRILOSEC) 40 MG capsule TAKE 1 CAPSULE TWICE A DAY  180 capsule  2  . polyethylene glycol powder (GLYCOLAX/MIRALAX) powder as directed.      . promethazine (PHENERGAN) 12.5 MG tablet Take 12.5 mg by mouth every 6 (six) hours as needed for nausea.      . sodium chloride (OCEAN) 0.65 % nasal spray Place 2 sprays into the nose as needed.  30 mL  6  . tiZANidine (ZANAFLEX) 4 MG tablet Take 4 mg by mouth every 8 (eight) hours as needed. DO NOT EXCEED 3 DOSES IN A 24 HOUR PERIOD       . topiramate (TOPAMAX) 100 MG tablet Take 150 mg by mouth at bedtime.       . meloxicam (MOBIC) 15 MG tablet Take 1 tablet (15 mg total) by mouth daily as needed for pain. Take with food  30 tablet  0  . [DISCONTINUED] amoxicillin-clavulanate (AUGMENTIN) 875-125 MG per tablet Take 1 tablet by mouth 2 (two) times daily.  14 tablet  0  . [DISCONTINUED] methylPREDNISolone (MEDROL, PAK,) 4 MG tablet follow package directions  21 tablet  0   No facility-administered encounter medications on file as of 09/09/2012.    No Known Allergies   Current Medications, Allergies, Past Medical History, Past Surgical History, Family History, and Social History were reviewed in [employer] record.    Review of Systems         Constitutional:   No  weight loss, night sweats,  Fevers, chills, fatigue, or  lassitude.  HEENT:   No headaches,  Difficulty swallowing,  Tooth/dental problems, or  Sore throat,                No sneezing, itching, ear ache, + nasal congestion, post nasal drip,   CV:  No chest pain,  Orthopnea, PND, swelling in lower extremities, anasarca, dizziness, palpitations, syncope.   GI  No heartburn, indigestion, abdominal pain, nausea, vomiting, diarrhea, change  in bowel habits, loss of appetite, bloody stools.   Resp: No coughing up of blood.  No change in color of mucus.  No wheezing.  No chest wall deformity  Skin: no rash or lesions.  GU: no dysuria, change in color of  urine, no urgency or frequency.  No flank pain, no hematuria   MS:  No joint swelling.   Psych:  No change in mood or affect. No depression or anxiety.  No memory loss.      .     Objective:   Physical Exam      WD, Thin, 46 y/o BF in NAD... GENERAL:  Alert & oriented; pleasant & cooperative... HEENT:  [place]/AT,  EACs-clear, TMs-wnl, NOSE- congested, clear discharge; THROAT-clear & wnl. NECK: supple w/ mild decrROM; no JVD; normal carotid impulses w/o bruits; no thyromegaly or nodules palpated; no lymphadenopathy. CHEST:  Clear to P & A; without wheezes/ rales/ or rhonchi. HEART:  Reg bradycardia;  gr 1 SEM without rubs or gallops... ABDOMEN:  Soft & nontender; normal bowel sounds; no organomegaly or masses detected. EXT: without deformities, mild arthritic changes; no varicose veins/ venous insuffic/ or edema. Tender along left upper chest wall, shoulder and back . nml grips, ROM of left arm , nml w/ + reproducible pain.  NEURO:  +trigger points esp in trapezius area... DERM:  No lesions noted; no rash etc..Marland Kitchen

## 2012-09-09 NOTE — Assessment & Plan Note (Signed)
Flare   Plan   Zyrtec 10mg  At bedtime   Saline nasal rinses As needed   Dymista 1 puff Twice daily  Until sample is finished.  I will call with chest xray results.  Please contact office for sooner follow up if symptoms do not improve or worsen or seek emergency care  follow up Dr. Kriste Basque  As planned next week and . As needed

## 2012-09-13 NOTE — Progress Notes (Signed)
Quick Note:  LMOM TCB x1. ______ 

## 2012-09-14 ENCOUNTER — Ambulatory Visit (INDEPENDENT_AMBULATORY_CARE_PROVIDER_SITE_OTHER): Payer: BC Managed Care – PPO | Admitting: Pulmonary Disease

## 2012-09-14 ENCOUNTER — Encounter: Payer: Self-pay | Admitting: Pulmonary Disease

## 2012-09-14 VITALS — BP 132/80 | HR 46 | Temp 98.0°F | Ht 68.5 in | Wt 139.2 lb

## 2012-09-14 DIAGNOSIS — I10 Essential (primary) hypertension: Secondary | ICD-10-CM

## 2012-09-14 DIAGNOSIS — J309 Allergic rhinitis, unspecified: Secondary | ICD-10-CM

## 2012-09-14 DIAGNOSIS — K589 Irritable bowel syndrome without diarrhea: Secondary | ICD-10-CM

## 2012-09-14 DIAGNOSIS — I495 Sick sinus syndrome: Secondary | ICD-10-CM

## 2012-09-14 DIAGNOSIS — M069 Rheumatoid arthritis, unspecified: Secondary | ICD-10-CM

## 2012-09-14 DIAGNOSIS — IMO0001 Reserved for inherently not codable concepts without codable children: Secondary | ICD-10-CM

## 2012-09-14 DIAGNOSIS — J45909 Unspecified asthma, uncomplicated: Secondary | ICD-10-CM

## 2012-09-14 DIAGNOSIS — K219 Gastro-esophageal reflux disease without esophagitis: Secondary | ICD-10-CM

## 2012-09-14 DIAGNOSIS — K5900 Constipation, unspecified: Secondary | ICD-10-CM

## 2012-09-14 DIAGNOSIS — F411 Generalized anxiety disorder: Secondary | ICD-10-CM

## 2012-09-14 DIAGNOSIS — E039 Hypothyroidism, unspecified: Secondary | ICD-10-CM

## 2012-09-14 DIAGNOSIS — R51 Headache: Secondary | ICD-10-CM

## 2012-09-14 MED ORDER — FLUTICASONE PROPIONATE 50 MCG/ACT NA SUSP: 2.0000 | Freq: Every day | NASAL | Status: DC

## 2012-09-14 NOTE — Progress Notes (Signed)
Quick Note:  Pt aware ______ 

## 2012-09-14 NOTE — Patient Instructions (Addendum)
Today we updated your med list in our EPIC system...    Continue your current medications the same...  Call for any questions...  Let's plan a follow up visit in 61mo w/ fasting labs, sooner if needed for problems.Marland KitchenMarland Kitchen
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
## 2012-09-15 ENCOUNTER — Other Ambulatory Visit: Payer: Self-pay | Admitting: Pulmonary Disease

## 2012-09-15 MED ORDER — AEROCHAMBER MV MISC: Status: DC

## 2012-09-15 NOTE — Telephone Encounter (Signed)
Rx for the aerochamber has been sent to the pharmacy.

## 2012-09-22 ENCOUNTER — Telehealth: Payer: Self-pay | Admitting: Physician Assistant

## 2012-09-22 NOTE — Telephone Encounter (Signed)
Patient called the answering service re: breathlessness. She reports seeing Dr. Kriste Basque with pulmonary approximately 2 weeks ago with similar complaints of losing her voice and choking while speaking. This was suspected to represent vocal cord dysfunction. Reviewing note, benzo was offered PRN for these symptoms but the patient declined. She was advised to follow-up with GI for possible GERD etiologies. She reports experiencing breathlessness when speaking, frequent nonproductive cough and occasionally choking, which has persisted since two weeks ago. Denies chest pain, swelling, PND, orthopnea, palpitations, lightheadedness or syncope. No unilateral leg pain, swelling or tenderness. She states she spoke with GI today and was advised to present to an urgent care. Should would like a second opinion. Her sentences are short and interrupted by short gasps of breath and frequent coughing. She reports she is not markedly short of breath when not speaking.   She last followed up with Norma Fredrickson, NP in 01/2012. She had recently been recovering from recurrent sinusitis. She also has a history of asthma, allergic rhinitis, hypothyroidism, RA, GERD, ASD s/p repair, HTN and chronic bradycardia. She underwent cardiac cath 07/2010 in the setting of a mildly abnormal Myoview revealing normal cors and EF. Echo 07/2011 showed EF 55-60%, atrial septal aneurysm without evidence of ASD. She was noted to be bradycardic in the 30s in the ED. She was set up for GXT 01/2012 for chronic competence testing which revealing appropriate HR & BP response. No ischemic changes. Short self-limiting run of SVT and rare non-conducted P waves.   Advised that she has been stable from a cardiac perspective, and that her symptoms may be attributed from alternative causes- allergic rhinitis, post nasal drip, URI, asthma, bronchitis, VCD, GERD. Advised to seek formal evaluation at an urgent care tonight or with PCP tomorrow if her symptoms remain  stable. If her breathing becomes markedly worse, advised to present to the nearest ED or call 911. She understood and agreed.   Jacqulyn Bath, PA-C 09/22/2012 6:32 PM

## 2012-10-11 ENCOUNTER — Telehealth: Payer: Self-pay | Admitting: Pulmonary Disease

## 2012-10-11 MED ORDER — AMOXICILLIN-POT CLAVULANATE 875-125 MG PO TABS: 1.0000 | ORAL_TABLET | Freq: Two times a day (BID) | ORAL | Status: DC

## 2012-10-11 MED ORDER — ALIGN 4 MG PO CAPS: 1.0000 | ORAL_CAPSULE | Freq: Every day | ORAL | Status: DC

## 2012-10-11 MED ORDER — METHYLPREDNISOLONE 4 MG PO KIT: PACK | ORAL | Status: DC

## 2012-10-11 NOTE — Telephone Encounter (Signed)
Per SN---  augmentin 875 mg  #14  1 po bid Align once daily Medrol dosepak  #1  Take as directed

## 2012-10-11 NOTE — Telephone Encounter (Signed)
Called spoke with patient, advised of SN's recs as stated below Pt verbalized her understanding and denied any questions Pt to call back if her symptoms do not improve or worsen Rx's sent to verified pharmacy Nothing further needed; will sign off

## 2012-10-11 NOTE — Telephone Encounter (Signed)
Called spoke with patient She has a flexible spending account and would the Align sent as rx  Align 4mg  1 po qd while on abx sent to pharmacy #[id number] (so she will have some the next time she needs it) While sending rx, saw that the abx and medrol were sent to wrong [facility], spoke with Marchelle Folks and cancelled rx Called [facility], was asked by pharmacist to resend electronically rather than verbally >> done Nothing further needed; will sign off

## 2012-10-11 NOTE — Telephone Encounter (Signed)
Called spoke with patient who reports doing yard work x5 hours on Saturday By Sunday afternoon she was having sinus pressure/congestion, occasional dark yellow drainage, PND with occasional nausea.  Denies chest symptoms (cough, congestion, wheezing, SOB, tx) Has been using saline nasal spray Dr Kriste Basque please advise, thank you.  [facility] - verified Last ov 4.30.14 w/ SN, pending 11.4.14

## 2012-11-28 ENCOUNTER — Telehealth: Payer: Self-pay | Admitting: Pulmonary Disease

## 2012-11-28 MED ORDER — AMOXICILLIN-POT CLAVULANATE 875-125 MG PO TABS: 1.0000 | ORAL_TABLET | Freq: Two times a day (BID) | ORAL | Status: DC

## 2012-11-28 MED ORDER — METHYLPREDNISOLONE 4 MG PO KIT: PACK | ORAL | Status: DC

## 2012-11-28 NOTE — Telephone Encounter (Signed)
Pt is c/o having sinus pressure, headache, facial pain an pressure, teeth pain and pressure, and eye pressure since Friday. Pt denies any nasal congestion or fever. Pt states she has had some PND. Pt has not tried any OTC sinus medications but she is taking zyrtec and mucinex daily. Please advise. [name], CMA No Known Allergies

## 2012-11-28 NOTE — Telephone Encounter (Signed)
Per SN:  augmentin 875 #14 1 po bid, medrol dosepak #1, mucinex 600 mg 1-2 bid with fluids, nasal saline prn, and align qd.  -----  Called, spoke with pt.  Informed her of above recs per Dr. Kriste Basque.  She is aware augmentin and medrol dosepak sent to [facility].  She is to call back if symptoms do not improve or worsen.  She verbalized understanding of instructions and voiced no further questions or concerns at this time.

## 2012-12-05 ENCOUNTER — Telehealth: Payer: Self-pay | Admitting: Internal Medicine

## 2012-12-05 NOTE — Telephone Encounter (Signed)
Left message for pt to call.

## 2012-12-05 NOTE — Telephone Encounter (Signed)
New Prob  Pt states that she has had some chest pain that goes down into her arm. She said she does not feel any tightness but she wants to speak with a nurse.

## 2012-12-06 NOTE — Telephone Encounter (Signed)
Spoke with pt, she is having occ episodes of discomfort under in her left arm pit, that goes into her left arm and into her neck. Yesterday she had a sharpe pain that went through her chest. She is usually working at her desk when this happens. The episodes will last about 15 to 20 min and just go away. There is nothing she can do to improve the discomfort. When she gets the discomfort she will become very tired and sleepy and then it will pass. She denies SOB. appt made for pt to see lori gerhardt np

## 2012-12-06 NOTE — Telephone Encounter (Signed)
Follow up  ° ° ° ° °Pt is returning your call  °

## 2012-12-06 NOTE — Telephone Encounter (Signed)
Left message for pt to call.

## 2012-12-07 ENCOUNTER — Encounter: Payer: Self-pay | Admitting: Nurse Practitioner

## 2012-12-07 ENCOUNTER — Ambulatory Visit (INDEPENDENT_AMBULATORY_CARE_PROVIDER_SITE_OTHER): Payer: BC Managed Care – PPO | Admitting: Nurse Practitioner

## 2012-12-07 VITALS — BP 140/90 | HR 41 | Ht 68.5 in | Wt 137.8 lb

## 2012-12-07 DIAGNOSIS — R071 Chest pain on breathing: Secondary | ICD-10-CM

## 2012-12-07 DIAGNOSIS — R0789 Other chest pain: Secondary | ICD-10-CM

## 2012-12-07 NOTE — Patient Instructions (Addendum)
Stay on your current medicines  You can try OTC Nexium in the place of your prilosec  See Dr. Graciela Husbands in 6 months  Call the [facility] office at [phone] if you have any questions, problems or concerns.

## 2012-12-07 NOTE — Progress Notes (Signed)
Brittany Hodges Date of Birth: 1966/05/01 Medical Record #[id number]  History of Present Illness: Ms. Brittany Hodges is seen back today for a work in visit. Seen for Dr. Graciela Husbands. Last seen her last September. She has a history of junctional rhythm, chronic bradycardia, past ASD repair, HTN, asthma, hypothyroidism, GERD, anemia, fibromyalgia and RA. Prior Myoview in 2011 showed EF of 52% and mild ischemia versus attenuation. She was cathed in March of 2012 and has no CAD and normal EF. Last echo was in March of 203 and showed a normal EF, trace AI, mild MR, atrial septal aneurysm and no evidence of residual ASD. Has chroinc bradycardia with past testing for chronotropic competence shown to be intact.   Was seen almost one year ago after being in the ER for sinusitis - heart rate was low - as always - she was asymptomatic. She had GXT testing - adequate HR response noted.   Comes in today. Here with arm pain. This has been going on for some time. It is a sharp pain that will radiate up to the neck. Lasts about 30 seconds. No aggravating or relieving factors noted. No associated symptoms. Belching more this week. Does not think her Prilosec is working. No exercise over the past 3 weeks due to work commitments. No passing out spells. HR chronically low. Her blood pressure at home has been running good. May have had a fast heart beat recently that was short lived and has not recurred.   Current Outpatient Prescriptions  Medication Sig Dispense Refill  . adalimumab (HUMIRA PEN) 40 MG/0.8ML injection Inject 40 mg into the skin every 14 (fourteen) days.       Marland Kitchen albuterol (PROAIR HFA) 108 (90 BASE) MCG/ACT inhaler Inhale 2 puffs into the lungs every 6 (six) hours as needed. For shortness of breath and wheezing      . amoxicillin (AMOXIL) 500 MG capsule Take 500 mg by mouth as needed (before dental procedures).      Marland Kitchen amoxicillin-clavulanate (AUGMENTIN) 875-125 MG per tablet Take 1 tablet by mouth 2 (two) times  daily.  14 tablet  0  . cetirizine (ZYRTEC ALLERGY) 10 MG tablet Take 1 tablet (10 mg total) by mouth daily.  30 tablet  6  . Cholecalciferol (VITAMIN D) 1000 UNITS capsule Take 1,000 Units by mouth daily.        . fluticasone (FLONASE) 50 MCG/ACT nasal spray Place 2 sprays into the nose daily.  16 g  6  . guaiFENesin (MUCINEX) 600 MG 12 hr tablet Take 2 tablets (1,200 mg total) by mouth 2 (two) times daily.  60 tablet  6  . levothyroxine (SYNTHROID, LEVOTHROID) 75 MCG tablet TAKE 1 TABLET EVERY DAY  30 tablet  0  . meloxicam (MOBIC) 15 MG tablet Take 1 tablet (15 mg total) by mouth daily as needed for pain. Take with food  30 tablet  0  . methylPREDNISolone (MEDROL DOSEPAK) 4 MG tablet follow package directions  21 tablet  0  . Multiple Vitamins-Calcium (ONE-A-DAY WOMENS FORMULA) TABS Take 1 tablet by mouth daily.      Marland Kitchen omeprazole (PRILOSEC) 40 MG capsule TAKE 1 CAPSULE TWICE A DAY  180 capsule  2  . polyethylene glycol powder (GLYCOLAX/MIRALAX) powder as directed.      . Probiotic Product (ALIGN) 4 MG CAPS Take 1 capsule by mouth daily. While on antibiotic  30 capsule  0  . promethazine (PHENERGAN) 12.5 MG tablet Take 12.5 mg by mouth every 6 (six) hours as needed  for nausea.      . sodium chloride (OCEAN) 0.65 % nasal spray Place 2 sprays into the nose as needed.  30 mL  6  . Spacer/Aero-Holding Chambers (AEROCHAMBER MV) inhaler Use as instructed  1 each  0  . tiZANidine (ZANAFLEX) 4 MG tablet Take 4 mg by mouth every 8 (eight) hours as needed. DO NOT EXCEED 3 DOSES IN A 24 HOUR PERIOD       . topiramate (TOPAMAX) 100 MG tablet Take 150 mg by mouth at bedtime.        No current facility-administered medications for this visit.    No Known Allergies  Past Medical History  Diagnosis Date  . Allergic rhinitis, cause unspecified   . Asthma   . Chest pain, unspecified     cardiac cath 07/31/10: normal coronaries; vigorous LVF  . Atrial septal defect     s/p repair;   Echo 08/07/10: EF  55-60%; mild MR; atrial septal aneurysm; no residual ASD  . Sinoatrial node dysfunction     eval. for chronotropic competence completed in past  . Hypothyroidism   . Esophageal reflux   . Irritable bowel syndrome   . Hematuria, unspecified   . Rheumatoid arthritis(714.0)   . Fibromyalgia   . Anxiety   . Anemia   . Sebaceous cyst   . Hypertension   . Low back pain   . Headache(784.0)   . Scoliosis   . Bradycardia     Past Surgical History  Procedure Laterality Date  . Tubal ligation    . Asd repair      History  Smoking status  . Never Smoker   Smokeless tobacco  . Never Used    History  Alcohol Use No    No family history on file.  Review of Systems: The review of systems is per the HPI.  Some recent sinus issues. All other systems were reviewed and are negative.  Physical Exam: BP 140/90  Pulse 41  Ht 5' 8.5" (1.74 m)  Wt 137 lb 12.8 oz (62.506 kg)  BMI 20.65 kg/m2 Patient is very pleasant and in no acute distress. Skin is warm and dry. Color is normal.  HEENT is unremarkable. Normocephalic/atraumatic. PERRL. Sclera are nonicteric. Neck is supple. No masses. No JVD. Lungs are clear. Cardiac exam shows a regular rate and rhythm. Her rate is slow. Abdomen is soft. Extremities are without edema. Gait and ROM are intact. Palpable left arm/shoulder tenderness elicited. No gross neurologic deficits noted.  LABORATORY DATA: EKG shows marked sinus bradycardia - rate of 41.  Lab Results  Component Value Date   WBC 6.2 03/16/2012   HGB 13.1 03/16/2012   HCT 41.3 03/16/2012   PLT 216.0 03/16/2012   GLUCOSE 82 01/24/2012   CHOL 171 03/16/2012   TRIG 49.0 03/16/2012   HDL 69.50 03/16/2012   LDLCALC 92 03/16/2012   ALT 13 01/24/2012   AST 25 01/24/2012   NA 136 01/24/2012   K 3.8 01/24/2012   CL 100 01/24/2012   CREATININE 0.70 01/24/2012   BUN 14 01/24/2012   CO2 22 01/24/2012   TSH 5.16 03/16/2012   INR 1.0 07/29/2010   ANGIOGRAPHIC DATA FROM March 2012:  1.  Ventriculography done in the RAO projection reveals vigorous global  systolic function. No segmental wall motion abnormalities are  identified.  2. The right coronary artery is smooth and provides posterior  descending posterolateral system.  3. The left main is free of critical disease.  4. The  left anterior descending artery courses to the apex and  provides 2 tiny marginal branches, both of which appeared to be  free of disease.  5. The circumflex provides a small first marginal, moderate second  marginal, 2 more posterolaterals, and large posterolateral branch.  The second marginal may have some very minor plaquing at the  ostium, best seen in the LAO views. This is not felt to be  significant. The distal vessels are large and free of critical  disease.   CONCLUSIONS:  1. Well-preserved left ventricular function.  2. No significant high-grade focal coronary obstruction.  DISPOSITION: At the present time, she is stable. She does not have  high-grade coronary artery disease. I would recommend follow up with  Dr. Graciela Husbands, and subsequently 2D echo and possibly the CT would be  suggested with a history of prior ASD repair.  Arturo Morton. Riley Kill, MD, [facility]   Assessment / Plan: 1. Arm pain - she has no known CAD - negative cath 2 years ago. She has palpable left arm/shoulder tenderness. She is reassured.   2. Prior ASD repair - with past echo in March of 2013.   3. HTN - has good control at home. I have asked her to continue to monitor.   4. Chronic bradycardia - no passing out spells reported. Had adequate HR response with her last GXT. No indication for PPM at this time.   5. GERD - may try the OTC Nexium that is now available.   See Dr. Graciela Husbands back in 6 months.   Patient is agreeable to this plan and will call if any problems develop in the interim.   Rosalio Macadamia, RN, ANP-C [facility] [address] [place], [place]  [address]

## 2013-02-14 ENCOUNTER — Telehealth: Payer: Self-pay | Admitting: Pulmonary Disease

## 2013-02-14 DIAGNOSIS — D649 Anemia, unspecified: Secondary | ICD-10-CM

## 2013-02-14 DIAGNOSIS — I10 Essential (primary) hypertension: Secondary | ICD-10-CM

## 2013-02-14 DIAGNOSIS — E7800 Pure hypercholesterolemia, unspecified: Secondary | ICD-10-CM

## 2013-02-14 DIAGNOSIS — F411 Generalized anxiety disorder: Secondary | ICD-10-CM

## 2013-02-14 DIAGNOSIS — E039 Hypothyroidism, unspecified: Secondary | ICD-10-CM

## 2013-02-14 NOTE — Telephone Encounter (Signed)
ATC patient, no answer [id number] 

## 2013-02-15 NOTE — Telephone Encounter (Signed)
Called, spoke with pt.  She has a pending OV with SN on Nov 4.  She is going to come in a few days - 1 wk prior to this for labs.  She would like them entered.  Dr. Kriste Basque, pls advise which labs pt will need.  Thank you.

## 2013-02-16 NOTE — Telephone Encounter (Signed)
Per SN---ok for the pt to come in 1 week prior to appt to have the fasting labs done.  The orders have been placed and the pt is aware. Nothing further is needed.

## 2013-03-20 ENCOUNTER — Other Ambulatory Visit (INDEPENDENT_AMBULATORY_CARE_PROVIDER_SITE_OTHER): Payer: BC Managed Care – PPO

## 2013-03-20 DIAGNOSIS — E039 Hypothyroidism, unspecified: Secondary | ICD-10-CM

## 2013-03-20 DIAGNOSIS — I10 Essential (primary) hypertension: Secondary | ICD-10-CM

## 2013-03-20 DIAGNOSIS — D649 Anemia, unspecified: Secondary | ICD-10-CM

## 2013-03-20 DIAGNOSIS — E7800 Pure hypercholesterolemia, unspecified: Secondary | ICD-10-CM

## 2013-03-20 LAB — CBC WITH DIFFERENTIAL/PLATELET
Basophils Absolute: 0 10*3/uL (ref 0.0–0.1)
Basophils Relative: 0.5 % (ref 0.0–3.0)
Eosinophils Absolute: 0 10*3/uL (ref 0.0–0.7)
Eosinophils Relative: 0 % (ref 0.0–5.0)
HCT: 35.8 % — ABNORMAL LOW (ref 36.0–46.0)
Hemoglobin: 11.6 g/dL — ABNORMAL LOW (ref 12.0–15.0)
Lymphocytes Relative: 34.5 % (ref 12.0–46.0)
Lymphs Abs: 1.2 10*3/uL (ref 0.7–4.0)
MCHC: 32.5 g/dL (ref 30.0–36.0)
MCV: 85.1 fl (ref 78.0–100.0)
Monocytes Absolute: 0.6 10*3/uL (ref 0.1–1.0)
Monocytes Relative: 16 % — ABNORMAL HIGH (ref 3.0–12.0)
Neutro Abs: 1.7 10*3/uL (ref 1.4–7.7)
Neutrophils Relative %: 49 % (ref 43.0–77.0)
Platelets: 177 10*3/uL (ref 150.0–400.0)
RBC: 4.2 Mil/uL (ref 3.87–5.11)
RDW: 14.1 % (ref 11.5–14.6)
WBC: 3.5 10*3/uL — ABNORMAL LOW (ref 4.5–10.5)

## 2013-03-20 LAB — LIPID PANEL
Cholesterol: 144 mg/dL (ref 0–200)
HDL: 62 mg/dL (ref >39.00)
LDL Cholesterol: 75 mg/dL (ref 0–99)
Total CHOL/HDL Ratio: 2
Triglycerides: 33 mg/dL (ref 0.0–149.0)
VLDL: 6.6 mg/dL (ref 0.0–40.0)

## 2013-03-20 LAB — BASIC METABOLIC PANEL
BUN: 11 mg/dL (ref 6–23)
CO2: 23 mEq/L (ref 19–32)
Calcium: 9.7 mg/dL (ref 8.4–10.5)
Chloride: 109 mEq/L (ref 96–112)
Creatinine, Ser: 0.8 mg/dL (ref 0.4–1.2)
GFR: 95.17 mL/min (ref >60.00)
Glucose, Bld: 81 mg/dL (ref 70–99)
Potassium: 4 mEq/L (ref 3.5–5.1)
Sodium: 138 mEq/L (ref 135–145)

## 2013-03-20 LAB — HEPATIC FUNCTION PANEL
ALT: 16 U/L (ref 0–35)
AST: 29 U/L (ref 0–37)
Albumin: 4 g/dL (ref 3.5–5.2)
Alkaline Phosphatase: 43 U/L (ref 39–117)
Bilirubin, Direct: 0.1 mg/dL (ref 0.0–0.3)
Total Bilirubin: 0.8 mg/dL (ref 0.3–1.2)
Total Protein: 8.1 g/dL (ref 6.0–8.3)

## 2013-03-20 LAB — TSH: TSH: 5.07 u[IU]/mL (ref 0.35–5.50)

## 2013-03-21 ENCOUNTER — Encounter: Payer: Self-pay | Admitting: Pulmonary Disease

## 2013-03-21 ENCOUNTER — Ambulatory Visit (INDEPENDENT_AMBULATORY_CARE_PROVIDER_SITE_OTHER): Payer: BC Managed Care – PPO | Admitting: Pulmonary Disease

## 2013-03-21 VITALS — BP 132/64 | HR 47 | Temp 97.5°F | Ht 68.5 in | Wt 136.6 lb

## 2013-03-21 DIAGNOSIS — Z23 Encounter for immunization: Secondary | ICD-10-CM

## 2013-03-21 DIAGNOSIS — K219 Gastro-esophageal reflux disease without esophagitis: Secondary | ICD-10-CM

## 2013-03-21 DIAGNOSIS — Q2111 Secundum atrial septal defect: Secondary | ICD-10-CM

## 2013-03-21 DIAGNOSIS — K589 Irritable bowel syndrome without diarrhea: Secondary | ICD-10-CM

## 2013-03-21 DIAGNOSIS — I10 Essential (primary) hypertension: Secondary | ICD-10-CM

## 2013-03-21 DIAGNOSIS — E039 Hypothyroidism, unspecified: Secondary | ICD-10-CM

## 2013-03-21 DIAGNOSIS — Q211 Atrial septal defect: Secondary | ICD-10-CM

## 2013-03-21 DIAGNOSIS — J45909 Unspecified asthma, uncomplicated: Secondary | ICD-10-CM

## 2013-03-21 DIAGNOSIS — M069 Rheumatoid arthritis, unspecified: Secondary | ICD-10-CM

## 2013-03-21 DIAGNOSIS — J309 Allergic rhinitis, unspecified: Secondary | ICD-10-CM

## 2013-03-21 DIAGNOSIS — K5900 Constipation, unspecified: Secondary | ICD-10-CM

## 2013-03-21 DIAGNOSIS — I495 Sick sinus syndrome: Secondary | ICD-10-CM

## 2013-03-21 DIAGNOSIS — D649 Anemia, unspecified: Secondary | ICD-10-CM

## 2013-03-21 DIAGNOSIS — R51 Headache: Secondary | ICD-10-CM

## 2013-03-21 DIAGNOSIS — IMO0001 Reserved for inherently not codable concepts without codable children: Secondary | ICD-10-CM

## 2013-03-21 DIAGNOSIS — F411 Generalized anxiety disorder: Secondary | ICD-10-CM

## 2013-03-21 MED ORDER — FIRST-DUKES MOUTHWASH MT SUSP: OROMUCOSAL | Status: DC
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
## 2013-03-21 NOTE — Patient Instructions (Signed)
Today we updated your med list in our EPIC system...    Continue your current medications the same...  We wrote a new prescription for Magic Mouthwash to use as needed for sore throat...  Call for any questions...  Let's plan a follow up visit in 76mo, sooner if needed for problems.Marland KitchenMarland Kitchen

## 2013-03-24 ENCOUNTER — Encounter: Payer: Self-pay | Admitting: Hematology & Oncology

## 2013-04-24 ENCOUNTER — Telehealth: Payer: Self-pay | Admitting: Hematology & Oncology

## 2013-04-24 NOTE — Telephone Encounter (Signed)
Left vm w NEW PATIENT today to remind them of their appointment with Dr. Ennever. Also, advised them to bring all meds and insurance information. ° °

## 2013-04-25 ENCOUNTER — Encounter: Payer: Self-pay | Admitting: Hematology & Oncology

## 2013-04-25 ENCOUNTER — Ambulatory Visit: Payer: BC Managed Care – PPO

## 2013-04-25 ENCOUNTER — Other Ambulatory Visit (HOSPITAL_BASED_OUTPATIENT_CLINIC_OR_DEPARTMENT_OTHER): Payer: BC Managed Care – PPO | Admitting: Lab

## 2013-04-25 ENCOUNTER — Ambulatory Visit (HOSPITAL_BASED_OUTPATIENT_CLINIC_OR_DEPARTMENT_OTHER): Payer: BC Managed Care – PPO | Admitting: Hematology & Oncology

## 2013-04-25 VITALS — BP 139/76 | HR 42 | Temp 98.4°F | Resp 14 | Ht 68.0 in | Wt 138.0 lb

## 2013-04-25 DIAGNOSIS — D72819 Decreased white blood cell count, unspecified: Secondary | ICD-10-CM

## 2013-04-25 DIAGNOSIS — D649 Anemia, unspecified: Secondary | ICD-10-CM

## 2013-04-25 LAB — CBC WITH DIFFERENTIAL (CANCER CENTER ONLY)
BASO#: 0 10*3/uL (ref 0.0–0.2)
BASO%: 0 % (ref 0.0–2.0)
EOS%: 1.1 % (ref 0.0–7.0)
Eosinophils Absolute: 0 10*3/uL (ref 0.0–0.5)
HCT: 34.2 % — ABNORMAL LOW (ref 34.8–46.6)
HGB: 10.8 g/dL — ABNORMAL LOW (ref 11.6–15.9)
LYMPH#: 1.2 10*3/uL (ref 0.9–3.3)
LYMPH%: 31.2 % (ref 14.0–48.0)
MCH: 27.5 pg (ref 26.0–34.0)
MCHC: 31.6 g/dL — ABNORMAL LOW (ref 32.0–36.0)
MCV: 87 fL (ref 81–101)
MONO#: 0.6 10*3/uL (ref 0.1–0.9)
MONO%: 16.3 % — ABNORMAL HIGH (ref 0.0–13.0)
NEUT#: 1.9 10*3/uL (ref 1.5–6.5)
NEUT%: 51.4 % (ref 39.6–80.0)
Platelets: 198 10*3/uL (ref 145–400)
RBC: 3.93 10*6/uL (ref 3.70–5.32)
RDW: 13.7 % (ref 11.1–15.7)
WBC: 3.7 10*3/uL — ABNORMAL LOW (ref 3.9–10.0)

## 2013-04-25 LAB — CHCC SATELLITE - SMEAR

## 2013-04-25 NOTE — Progress Notes (Signed)
This office note has been dictated.

## 2013-04-26 NOTE — Progress Notes (Signed)
DIAGNOSES: 1. Leukopenia. 2. Normochromic, normocytic anemia. 3. Rheumatoid arthritis.  HISTORY OF PRESENT ILLNESS:  Brittany Hodges is very nice 47 year old African American female.  She is followed by Dr. Alroy Dust.  She has a 20-year history of rheumatoid arthritis.  She has been on methotrexate in the past.  She has been on Enbrel in the past.  She could not tolerate these.  She now is on Humira.  She sees Dr. Corliss Skains for her Humira.  She is on quite a few medications.  She is on Plaquenil.  She is also on Synthroid.  She is on Topamax.  She has been found to have some leukopenia and anemia.  Going through the records, going back to 2012, white cell count was 4.5.  Later on that year white cell count was 2.2.  Hemoglobin was 12 and platelet count was 160,000.  In October of 2013, her white cell count was 6.2.  She was on prednisone at that time.  Hemoglobin was 13.1 and platelet count was 216,000.  In November of this year, her white cell count was 3.5, hemoglobin 11.6, hematocrit 35.8, platelet count was 177,000.  She has had a slight increase in monocytes.  Her neutrophils and lymphocytes have been of a fairly decent ratio.  About a year ago, she had an SPEP done.  There was no evidence of a monoclonal spike.  She was kindly referred to the [facility] for this leukopenia evaluation.  She says that her rheumatoid arthritis has bothered her on occasion. She says Humira does seem to help a little bit.  Methotrexate and Enbrel really helped better, but again she cannot tolerate these because of low blood counts.  She is working without difficulties.  She works for [employer].  She has had no infections.  She has had no rashes.  There has been no swollen lymph nodes.  There has been no cough or shortness of breath. She has had no change in bowel or bladder habits.  She had a mammogram recently and everything was fine from what she says.  She has had  no issues with swallowing.  There has been no visual problems.  Apparently she was seen back in October by Dr. Corliss Skains.  Her IgG level was 2182. Because of this, it was felt that she needed to be evaluated by [place] for this.  Again, she has had the leukopenia.  PAST MEDICAL HISTORY:  Remarkable for: 1. Rheumatoid arthritis. 2. Headaches. 3. Atrial septal defect repair. 4. Hypothyroidism. 5. Fibromyalgia. 6. Anxiety. 7. Hypertension. 8. Reflux.  ALLERGIES: 1. Methotrexate. 2. Enbrel.  MEDICATIONS: 1. Humira 40 mg every 2 weeks, subcu. 2. Albuterol inhaler 2 puffs q.6 hours p.r.n. 3. Zyrtec 10 mg p.o. daily. 4. Flonase nasal spray, 2 sprays daily. 5. Plaquenil 200 mg p.o. b.i.d. 6. Synthroid 0.075 mg p.o. daily. 7. Mobic 15 mg p.o. daily p.r.n. 8. Prilosec 40 mg p.o. daily. 9. Zanaflex 4 mg every 8 hours p.r.n. 10.Topamax 100 mg p.o. at bedtime.  SOCIAL HISTORY:  Negative for tobacco or alcohol use.  She has no other occupational exposure.  FAMILY HISTORY:  Noncontributory.  She said only her grandmother had rheumatoid arthritis.  REVIEW OF SYSTEMS:  As stated in history of present illness.  PHYSICAL EXAMINATION:  General:  This is a thin, but well-nourished [place] American female in no obvious distress.  Vital Signs: Temperature of 98.4, pulse 42, respiratory 18, blood pressure 139/76. Weight is 138 pounds.  Head and Neck:  Normocephalic, atraumatic skull. There are no ocular or oral lesions.  There are no palpable cervical or supraclavicular lymph nodes.  Thyroid is not palpable.  Lungs:  Clear bilaterally.  Cardiac:  Regular rate and rhythm with a normal S1, S2. She has no murmurs, rubs, or bruits.  Abdomen:  Soft.  She has good bowel sounds.  There is no fluid wave.  There is no palpable abdominal mass.  There is no palpable hepatosplenomegaly.  Back:  Shows no tenderness over the spine, ribs, or hips.  Extremities:  Show no clubbing, cyanosis, or edema.  She  does have some slight swelling of joints on her fingers.  Skin: Shows no rashes, ecchymoses, or petechia. Neurological:  Shows no focal neurological deficit.  LABORATORY STUDIES:  White cell count 3.7, hemoglobin 10.8, hematocrit 34.2, platelet count 198.  MCV is 87.  Peripheral smear shows a normochromic, normocytic population of red blood cells.  There is no rouleaux formation.  There are no nucleated red blood cells.  I see no teardrop cells.  She has no schistocytes or spherocytes.  I see no polychromasia.  There are no target cells.  White cells are slightly decreased in number.  She has a good maturation of her white blood cells.  She has no immature myeloid cells.  There are no atypical lymphocytes.  Platelets are adequate in number and size.  IMPRESSION:  Brittany Hodges is a very charming 47 year old African American female.  She has long-standing rheumatoid arthritis.  One has to think that the leukopenia is more likely from her rheumatoid arthritis.  It certainly could also be ethnic-associated leukopenia.  I do not see anything on her blood smear or on her clinical exam that would suggest underlying myeloproliferative or myelodysplastic process.  I have no idea what to make of an elevated IgG.  She had negative M- spike a year ago.  I did not repeat an SPEP on her.  If she does have a positive SPEP, then this probably is an MGUS from the rheumatoid arthritis.  She is not on methotrexate anymore, so I do not think this would be an issue.  The anemia is probably multifactorial.  She is not symptomatic with this.  Again, I think that this likely could be from her medications.  I would just follow this for right now.  I think we can just have Brittany Hodges come back in 3 months.  I do not see anything on her blood smear or on her exam that would be suspicious for a bone marrow disorder.  I spent a good 45 minutes or so with Brittany Hodges.  She is very, very nice.  I reviewed her  lab work with her.  I went over my recommendations with her.  She is reassured that she is doing fairly well.  Again, we will see her back in about 3 months' time.    ______________________________ Josph Macho, M.D. PRE/MEDQ  D:  04/25/2013  T:  04/26/2013  Job:  [id number]

## 2013-05-25 ENCOUNTER — Telehealth: Payer: Self-pay | Admitting: Pulmonary Disease

## 2013-05-25 NOTE — Telephone Encounter (Signed)
Immunization report has been printed and faxed to number given by pt.  i called and lmom to make her aware this has been done.

## 2013-06-08 ENCOUNTER — Encounter: Payer: Self-pay | Admitting: Internal Medicine

## 2013-06-08 ENCOUNTER — Ambulatory Visit (INDEPENDENT_AMBULATORY_CARE_PROVIDER_SITE_OTHER): Payer: BC Managed Care – PPO | Admitting: Internal Medicine

## 2013-06-08 ENCOUNTER — Other Ambulatory Visit: Payer: Self-pay | Admitting: Pulmonary Disease

## 2013-06-08 VITALS — BP 154/84 | HR 43 | Ht 67.0 in | Wt 140.0 lb

## 2013-06-08 DIAGNOSIS — Q2111 Secundum atrial septal defect: Secondary | ICD-10-CM

## 2013-06-08 DIAGNOSIS — I10 Essential (primary) hypertension: Secondary | ICD-10-CM

## 2013-06-08 DIAGNOSIS — R079 Chest pain, unspecified: Secondary | ICD-10-CM

## 2013-06-08 DIAGNOSIS — I495 Sick sinus syndrome: Secondary | ICD-10-CM

## 2013-06-08 DIAGNOSIS — Q211 Atrial septal defect: Secondary | ICD-10-CM

## 2013-06-08 NOTE — Assessment & Plan Note (Signed)
Asymptomatic sinus node dysfunction we'll continue to follow

## 2013-06-08 NOTE — Assessment & Plan Note (Signed)
Status post repair

## 2013-06-08 NOTE — Patient Instructions (Signed)
Your physician recommends that you continue on your current medications as directed. Please refer to the Current Medication list given to you today.  Your physician wants you to follow-up in: 1 year with Dr. Klein.  You will receive a reminder letter in the mail two months in advance. If you don't receive a letter, please call our office to schedule the follow-up appointment.  

## 2013-06-08 NOTE — Assessment & Plan Note (Signed)
Atypical chest pain with a negative Myoview 3 years ago. We'll continue to follow

## 2013-06-08 NOTE — Progress Notes (Signed)
Patient Care Team: Noralee Space, MD as PCP - General (Pulmonary Disease) Deboraha Sprang, MD as Consulting Physician (Cardiology)   HPI  Brittany Hodges is a 48 y.o. female is seen in followup for junctional rhythm in the setting of atrial septal defect repair. In the past she has had assessments of chrontropic competence which have been adequate.  Exercise recently has done pretty well. She does have mild limitations but they seem to be somewhat less than previously.  She continues to have episodes of chest discomfort. These are unrelated to exertion. She is exercising 3-4 days per week at the gym  She underwent myoview scanning in the fall 2011 demonstrating mild ischemia v attenusation with EF 52% and min LV dilitation.  The story above is from before but not any different from now   Past Medical History  Diagnosis Date  . Allergic rhinitis, cause unspecified   . Asthma   . Chest pain, unspecified     cardiac cath 07/31/10: normal coronaries; vigorous LVF  . Atrial septal defect     s/p repair;   Echo 08/07/10: EF 55-60%; mild MR; atrial septal aneurysm; no residual ASD  . Sinoatrial node dysfunction     eval. for chronotropic competence completed in past  . Hypothyroidism   . Esophageal reflux   . Irritable bowel syndrome   . Hematuria, unspecified   . Rheumatoid arthritis(714.0)   . Fibromyalgia   . Anxiety   . Anemia   . Sebaceous cyst   . Hypertension   . Low back pain   . Headache(784.0)   . Scoliosis   . Bradycardia     Past Surgical History  Procedure Laterality Date  . Tubal ligation    . Asd repair      Current Outpatient Prescriptions  Medication Sig Dispense Refill  . adalimumab (HUMIRA PEN) 40 MG/0.8ML injection Inject 40 mg into the skin every 14 (fourteen) days.       Marland Kitchen albuterol (PROAIR HFA) 108 (90 BASE) MCG/ACT inhaler Inhale 2 puffs into the lungs every 6 (six) hours as needed. For shortness of breath and wheezing      . amoxicillin  (AMOXIL) 500 MG capsule Take 500 mg by mouth as needed (before dental procedures).      . cetirizine (ZYRTEC ALLERGY) 10 MG tablet Take 1 tablet (10 mg total) by mouth daily.  30 tablet  6  . Cholecalciferol (VITAMIN D) 1000 UNITS capsule Take 1,000 Units by mouth daily.        . Diphenhyd-Hydrocort-Nystatin (FIRST-DUKES MOUTHWASH) SUSP 1 teaspoon swish and swallow three times daily as needed  237 mL  2  . fluticasone (FLONASE) 50 MCG/ACT nasal spray Place 2 sprays into the nose daily.  16 g  6  . guaiFENesin (MUCINEX) 600 MG 12 hr tablet Take 1,200 mg by mouth as needed.      . hydroxychloroquine (PLAQUENIL) 200 MG tablet Take 200 mg by mouth 2 (two) times daily.      Marland Kitchen levothyroxine (SYNTHROID, LEVOTHROID) 75 MCG tablet TAKE 1 TABLET EVERY DAY  30 tablet  0  . meloxicam (MOBIC) 15 MG tablet Take 1 tablet (15 mg total) by mouth daily as needed for pain. Take with food  30 tablet  0  . Multiple Vitamins-Calcium (ONE-A-DAY WOMENS FORMULA) TABS Take 1 tablet by mouth daily.      Marland Kitchen omeprazole (PRILOSEC) 40 MG capsule daily      . polyethylene glycol powder (GLYCOLAX/MIRALAX) powder  as directed.      . Probiotic Product (ALIGN) 4 MG CAPS Take 1 capsule by mouth daily. While on antibiotic  30 capsule  0  . promethazine (PHENERGAN) 12.5 MG tablet Take 12.5 mg by mouth every 6 (six) hours as needed for nausea.      . sodium chloride (OCEAN) 0.65 % nasal spray Place 2 sprays into the nose as needed.  30 mL  6  . Spacer/Aero-Holding Chambers (AEROCHAMBER MV) inhaler Use as instructed  1 each  0  . tiZANidine (ZANAFLEX) 4 MG tablet Take 4 mg by mouth every 8 (eight) hours as needed. DO NOT EXCEED 3 DOSES IN A 24 HOUR PERIOD       . topiramate (TOPAMAX) 100 MG tablet Take 100 mg by mouth at bedtime.        No current facility-administered medications for this visit.    Allergies  Allergen Reactions  . Methotrexate Derivatives     Caused her WBC to drop    Review of Systems negative except from HPI  and PMH  Physical Exam BP 154/84  Pulse 43  Ht 5\' 7"  (1.702 m)  Wt 140 lb (63.504 kg)  BMI 21.92 kg/m2 ,,pes Well developed and nourished in no acute distress HENT normal Neck supple with JVP-flat Clear slwo but Regular rate and rhythm, 2/6 systolic m Abd-soft with active BS No Clubbing cyanosis edema Skin-warm and dry A & Oriented  Grossly normal sensory and motor function  ECG demonstrates sinus rhythm 43 Intervals 14/096   Assessment and  Plan

## 2013-06-08 NOTE — Assessment & Plan Note (Signed)
Borderline elevated. We'll continue to follow defer management at this point to Dr. Bethann Goo

## 2013-06-09 ENCOUNTER — Other Ambulatory Visit: Payer: Self-pay | Admitting: Pulmonary Disease

## 2013-06-16 ENCOUNTER — Telehealth: Payer: Self-pay | Admitting: Pulmonary Disease

## 2013-06-16 ENCOUNTER — Ambulatory Visit (INDEPENDENT_AMBULATORY_CARE_PROVIDER_SITE_OTHER): Payer: BC Managed Care – PPO | Admitting: Adult Health

## 2013-06-16 ENCOUNTER — Encounter: Payer: Self-pay | Admitting: Adult Health

## 2013-06-16 VITALS — BP 130/82 | HR 46 | Temp 98.1°F | Ht 68.0 in | Wt 141.4 lb

## 2013-06-16 DIAGNOSIS — J309 Allergic rhinitis, unspecified: Secondary | ICD-10-CM

## 2013-06-16 MED ORDER — CETIRIZINE HCL 10 MG PO TABS: ORAL_TABLET | ORAL | Status: DC

## 2013-06-16 MED ORDER — FIRST-DUKES MOUTHWASH MT SUSP: OROMUCOSAL | Status: DC

## 2013-06-16 NOTE — Progress Notes (Signed)
Subjective:    Patient ID: Brittany Hodges, female    DOB: November 23, 1965, 48 y.o.   MRN: [id number]  HPI 48 y/o BF  With known hx of AR & Asthma;  HBP;  Hx CP & ASD repair;  Hypothyroid;  GERD/ IBS/ constipation;  hx hematuria evaluated by Gerilyn Pilgrim;  RA/ FM/ LBP follwed by DrDeveshwar;  Chronic headaches/ Migraines followed by DrFreeman;  Anxiety...   06/16/2013 Acute OV  Pt complains of Left ear pain/fluid in ear from sinus congestion x1 week.            Problem List:   As noted> she has mult specialists tending to ALL her medical problems...  ALLERGIC RHINITIS (ICD-477.9) - on OTC Antihist (eg- ZYRTEK) Qam; Saline nasal sp every 1-2 H during the day + MUCINEX 2Bid; & FLONASE 2spQhs... rec to use breathe right nasal strips at bedtime Prn to help w/ drainage... she knows to avoid Afrin due to BP. ~  9/11:  She reports that recent ENT eval in [place] showed reflux related ENT symptoms & Rx w/ Dexilant> ch to OMEPRAZOLE 40mg Bid. ~  9/12:  She reports recent ENT eval in [place] for "sinus pressure" & CT scan showed essentially neg w/o acute sinusitis & min chronic changes... ~  9/13:  She went to the ER w/ facial pain over her sinuses; ER eval showed tender over max areas, CXR was clear, no sinus films done, diagnosed w/ sinusitis & treated w/ ZPak & Depo shot (all symptoms resolved & back to baseline).  ASTHMA (ICD-493.90) -  uses PROAIR as needed... denies cough, sputum, hemoptysis, worsening dyspnea, wheezing, chest pains, snoring, daytime hypersomnolence, etc... On the OMEP Bid & antireflux regimen to help nocturnal symptoms... ~  CXR 8/09 showed prev median sternotomy, heart norm size, lungs clear, +scoliosis... ~  CXR 11/12 showed heart size normal, lungs clear, mild scoliosis, NAD.Marland Kitchen. ~  CXR 9/13 revealed s/p median sternotomy, normal heart size, clear lungs, NAD...  HYPERTENSION (ICD-401.9) - controlled on low salt diet alone (prev on Norvasc & avoiding meds due to her bradycardia)...  ~  9/12: BP=  110/62 today & doing satis w/o medication; denies visual changes, CP, palipit, dizziness, syncope, dyspnea, edema, etc...  ~  10/12: BP= 118/78 & stable- remains asymptomatic x for her mult somatic complaints... ~  10/13:  BP= 122/84 & she denies current CP, palpit, SOB, edema, etc..  Hx of CHEST PAIN (ICD-786.50) >> most of it is AtypCWP related to her FM... Hx of ATRIAL SEPTAL DEFECT (ICD-745.5) - S/P ASD repair 1985> She is followed by DrStuckey & DrKlein... SA NODE DYSFUNCTION >> ~  EKG shows junctional bradycardia & NSSTTWA... ~  2DEcho 7/06 showed tr MR & TR, normal LVF... ~  Followed by DrKlein for hx SA node dysfunction/ junctional rhythm in the setting of prev ASD repair- doing OK/ stable rhythm/ good exerc capacity. ~  She saw DrKlein 9/11 for CP & Bradycardia (junctional brady)- not on BP/vasoactive meds, heart rate in the 40's> she tells me they are trying to hold off on pacemaker as long as poss. ~  Myoview 10/11 was abnormal w/ mild revers defect in ant wall, mild LV dil & EF= 52%... ~  persistant vs recurrent CP 3/12 lead to Cath by DrStuckey> norm coronaries w/ min luminal irregularities, well preserved LVF... ~  2DEcho 4/12 showed norm LV size & funct w/ EF=55-60%, no regional wall motion abn, mildMR, trivAI, +atrial septal aneurysm ~  9/12: f/u DrKlein & stable, no changes made... ~  She had f/u DrKlein 4/13> Hx junctional rhythm in the setting of ASD repair; Myoview 2011 showed attenuation vs mild ischemia, EF=52% & min LVdil; exercising at gym 3-4d per week, has non-exertional chest discomfort, no changes made...  ~  9/13:  Cards ordered Treadmill to check for chronotropic competence> good exerc tol, no CP, normal BP response, no ischemia & good HR response to exercise; no change in meds (not on any cardiac meds & BP= 122/84 on diet alone).  HYPOTHYROIDISM (ICD-244.9) - on SYNTHROID 51mcg/d> prev on Synthroid 53mcg/d per DrKerr, but she was out of meds for over a yr when she  returned here 9/11... ~  TSH here in 2007 = 1.32 ~  9/09: note from [facility] reviewed- she had been off Synthroid & TSH was 13.14 w/ Synthroid restarted. ~  labs here 9/11 off meds showed TSH= 7.79... rec> start SYNTHROID 20mcg/d. ~  labs 11/11 on Levo75 showed TSH= 4.67 ~  Labs 3/12 on Levo75 showed TSH= 1.53 ~  Labs 11/12 on Levo75 showed TSH= 4.02 ~  Labs 10/13 on Levo75 showed TSH= 5/19 & reminded to take every day 1st thing in the AM on empty stomach...  GERD (ICD-530.81) - on OMEPRAZOLE 40mg Bid IRRITABLE BOWEL SYNDROME (ICD-564.1)   << GI - followed by DrWalker in [place]  >> CONSTIPATION (ICD-564.00) ~  12/12: she had colonoscopy by DrMShearin in [place]> neg colon w/o polyps, divertics, etc; felt to have IBS-c...  HEMATURIA UNSPECIFIED (ICD-599.70) - she developed gross hematuria 9/11- neg culture; f/u showed microhematuria & referred to Urology;  seen by [facility] 10/11 & his UA was neg- Renal Sonar neg for any lesions or stones & Cysto was neg as well (DrNesi continues to follow pt).  RHEUMATOID ARTHRITIS (ICD-714.0)   <<  Rheum -  followed by DrDeveshwar  >> FIBROMYALGIA (ICD-729.1)                  prev Rx> Pred, MTX, Humira, Enbrel - off all meds since 2010 LOW BACK PAIN (ICD-724.2) ~  she has a hx of RA and Fibromyalgia and was followed by DrDeveshwar for Rheumatology- prev on Prednisone, Plaquenil, Lyrica, Imipramine, Flexeril, Tramadol, and Vicodin... 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 Currently taking NAPROSYN 500mg  Bid as needed...  HEADACHES >> Hx migraine HAs and mixed muscle contraction HAs in the past;  Treated by [facility]Freeman[facility] w/ TOPAMAX 150mg /d & Zanaflex 4mg  Tid prn;  He has also given her shots in the neck which really helped in the past... ~  9/12: pt reports f/u eval DrFreeman w/ trigger point injections and incr Topamax to 150mg /d; she states improved...  ANXIETY (ICD-300.00) - not currently  taking medication.  ANEMIA (ICD-285.9) ~  labs 7/09 from [place] showed Hg= 12.4 ~  labs 9/11 here showed Hg= 13.0 ~  Labs 3/12 showed Hg= 12.8 ~  Labs 10/13 showed Hg= 13.1  SEBACEOUS CYST, SCALP (ICD-706.2) - this resolved w/ Keflex... she has a hx of MRSA exposure from her child and had a MRSA buttock abscess drained by Kennyth Lose 9/08...   Past Surgical History  Procedure Laterality Date  . Tubal ligation    . Asd repair      Outpatient Encounter Prescriptions as of 06/16/2013  Medication Sig  . adalimumab (HUMIRA PEN) 40 MG/0.8ML injection Inject 40 mg into the skin every 14 (fourteen) days.   Marland Kitchen albuterol (PROAIR HFA) 108 (90 BASE) MCG/ACT inhaler Inhale  2 puffs into the lungs every 6 (six) hours as needed. For shortness of breath and wheezing  . cetirizine (ZYRTEC) 10 MG tablet TAKE ONE TABLET BY MOUTH EVERY DAY  . Cholecalciferol (VITAMIN D) 1000 UNITS capsule Take 1,000 Units by mouth daily.    . Diphenhyd-Hydrocort-Nystatin (FIRST-DUKES MOUTHWASH) SUSP 1 teaspoon swish and swallow three times daily as needed  . fluticasone (FLONASE) 50 MCG/ACT nasal spray Place 2 sprays into the nose daily.  Marland Kitchen guaiFENesin (MUCINEX) 600 MG 12 hr tablet Take 1,200 mg by mouth as needed.  . hydroxychloroquine (PLAQUENIL) 200 MG tablet Take 200 mg by mouth 2 (two) times daily.  Marland Kitchen levothyroxine (SYNTHROID, LEVOTHROID) 75 MCG tablet TAKE 1 TABLET EVERY DAY  . meloxicam (MOBIC) 15 MG tablet Take 1 tablet (15 mg total) by mouth daily as needed for pain. Take with food  . Multiple Vitamins-Calcium (ONE-A-DAY WOMENS FORMULA) TABS Take 1 tablet by mouth daily.  Marland Kitchen omeprazole (PRILOSEC) 40 MG capsule TAKE 1 CAPSULE TWICE DAILY  . polyethylene glycol powder (GLYCOLAX/MIRALAX) powder as directed.  . Probiotic Product (ALIGN) 4 MG CAPS Take 1 capsule by mouth daily. While on antibiotic  . promethazine (PHENERGAN) 12.5 MG tablet Take 12.5 mg by mouth every 6 (six) hours as needed for nausea.  . sodium chloride  (OCEAN) 0.65 % nasal spray Place 2 sprays into the nose as needed.  Marland Kitchen Spacer/Aero-Holding Chambers (AEROCHAMBER MV) inhaler Use as instructed  . tiZANidine (ZANAFLEX) 4 MG tablet Take 4 mg by mouth every 8 (eight) hours as needed. DO NOT EXCEED 3 DOSES IN A 24 HOUR PERIOD   . topiramate (TOPAMAX) 100 MG tablet Take 100 mg by mouth at bedtime.   . [DISCONTINUED] levothyroxine (SYNTHROID, LEVOTHROID) 75 MCG tablet TAKE 1 TABLET DAILY  . [DISCONTINUED] omeprazole (PRILOSEC) 40 MG capsule daily  . [DISCONTINUED] amoxicillin (AMOXIL) 500 MG capsule Take 500 mg by mouth as needed (before dental procedures).    Allergies  Allergen Reactions  . Methotrexate Derivatives     Caused her WBC to drop     Current Medications, Allergies, Past Medical History, Past Surgical History, Family History, and Social History were reviewed in [employer] record.    Review of Systems         Constitutional:   No  weight loss, night sweats,  Fevers, chills, fatigue, or  lassitude.  HEENT:   No headaches,  Difficulty swallowing,  Tooth/dental problems, or  Sore throat,                No sneezing, itching, ear ache, + nasal congestion, post nasal drip,   CV:  No chest pain,  Orthopnea, PND, swelling in lower extremities, anasarca, dizziness, palpitations, syncope.   GI  No heartburn, indigestion, abdominal pain, nausea, vomiting, diarrhea, change in bowel habits, loss of appetite, bloody stools.   Resp: No coughing up of blood.  No change in color of mucus.  No wheezing.  No chest wall deformity  Skin: no rash or lesions.  GU: no dysuria, change in color of urine, no urgency or frequency.  No flank pain, no hematuria   MS:  No joint swelling.   Psych:  No change in mood or affect. No depression or anxiety.  No memory loss.      .     Objective:   Physical Exam      WD, Thin, 47 y/o BF in NAD... GENERAL:  Alert & oriented; pleasant & cooperative... HEENT:  [place]/AT,  EACs-mild wax , TMs-pale , NOSE- congested, clear discharge; THROAT-clear & wnl. NECK: supple w/ mild decrROM; no JVD; normal carotid impulses w/o bruits; no thyromegaly or nodules palpated; no lymphadenopathy. CHEST:  Clear to P & A; without wheezes/ rales/ or rhonchi. HEART:  Reg bradycardia;  gr 1 SEM without rubs or gallops... ABDOMEN:  Soft & nontender; normal bowel sounds; no organomegaly or masses detected. EXT: without deformities, mild arthritic changes; no varicose veins/ venous insuffic/ or edema. NEURO:  No focal deficits noted.  DERM:  No lesions noted; no rash etc..Marland Kitchen

## 2013-06-16 NOTE — Assessment & Plan Note (Signed)
Flare with eustachian tube dysfunction   Plan  Zyrtec 10mg  At bedtime  As needed  Drainage  Saline nasal rinses As needed   Dymista 1 puff Twice daily  until sample is gone  Please contact office for sooner follow up if symptoms do not improve or worsen or seek emergency care

## 2013-06-16 NOTE — Telephone Encounter (Signed)
Spoke with JJ pt is scheduled to come in and see TP at 4 pm this afternoon. Nothing further needed

## 2013-06-16 NOTE — Addendum Note (Signed)
Addended by: Parke Poisson E on: 06/16/2013 04:51 PM   Modules accepted: Orders

## 2013-06-16 NOTE — Patient Instructions (Addendum)
Zyrtec 10mg  At bedtime  As needed  Drainage  Saline nasal rinses As needed   Dymista 1 puff Twice daily  until sample is gone  Please contact office for sooner follow up if symptoms do not improve or worsen or seek emergency care

## 2013-06-22 ENCOUNTER — Telehealth: Payer: Self-pay | Admitting: Pulmonary Disease

## 2013-06-22 MED ORDER — CETIRIZINE HCL 10 MG PO TABS: ORAL_TABLET | ORAL | Status: DC

## 2013-06-22 NOTE — Telephone Encounter (Signed)
Rx has been called in. Pt is aware. 

## 2013-07-25 ENCOUNTER — Telehealth: Payer: Self-pay | Admitting: Internal Medicine

## 2013-07-25 NOTE — Telephone Encounter (Signed)
New message  Pt called state that she has an aching pain in her right arm that travels to her hand.. It makes her hand throb. Pt states this is a spontaneous occurrence that has happened for the past hour. Pt is requesting a call back to discuss.

## 2013-07-25 NOTE — Telephone Encounter (Signed)
Patient tells me she saw nurse at her work and discussed this. Nurse tells her description sounds like this is muscle related, and try Tylenol to see if pain subsides. I encouraged her to go to the ED if symptoms persist/worsen/CP begins. Patient verbalized understanding and agreeable to plan.

## 2013-07-26 ENCOUNTER — Ambulatory Visit (HOSPITAL_BASED_OUTPATIENT_CLINIC_OR_DEPARTMENT_OTHER): Payer: BC Managed Care – PPO | Admitting: Hematology & Oncology

## 2013-07-26 ENCOUNTER — Other Ambulatory Visit (HOSPITAL_BASED_OUTPATIENT_CLINIC_OR_DEPARTMENT_OTHER): Payer: BC Managed Care – PPO | Admitting: Lab

## 2013-07-26 ENCOUNTER — Encounter: Payer: Self-pay | Admitting: Hematology & Oncology

## 2013-07-26 VITALS — BP 145/81 | HR 50 | Temp 98.1°F | Resp 14 | Ht 65.0 in | Wt 143.0 lb

## 2013-07-26 DIAGNOSIS — D649 Anemia, unspecified: Secondary | ICD-10-CM

## 2013-07-26 DIAGNOSIS — D72819 Decreased white blood cell count, unspecified: Secondary | ICD-10-CM

## 2013-07-26 LAB — CBC WITH DIFFERENTIAL (CANCER CENTER ONLY)
BASO#: 0 10*3/uL (ref 0.0–0.2)
BASO%: 0.7 % (ref 0.0–2.0)
EOS%: 0.7 % (ref 0.0–7.0)
Eosinophils Absolute: 0 10*3/uL (ref 0.0–0.5)
HCT: 35.5 % (ref 34.8–46.6)
HGB: 11.2 g/dL — ABNORMAL LOW (ref 11.6–15.9)
LYMPH#: 0.9 10*3/uL (ref 0.9–3.3)
LYMPH%: 32 % (ref 14.0–48.0)
MCH: 27 pg (ref 26.0–34.0)
MCHC: 31.5 g/dL — ABNORMAL LOW (ref 32.0–36.0)
MCV: 86 fL (ref 81–101)
MONO#: 0.6 10*3/uL (ref 0.1–0.9)
MONO%: 20.7 % — ABNORMAL HIGH (ref 0.0–13.0)
NEUT#: 1.3 10*3/uL — ABNORMAL LOW (ref 1.5–6.5)
NEUT%: 45.9 % (ref 39.6–80.0)
Platelets: 199 10*3/uL (ref 145–400)
RBC: 4.15 10*6/uL (ref 3.70–5.32)
RDW: 13.6 % (ref 11.1–15.7)
WBC: 2.8 10*3/uL — ABNORMAL LOW (ref 3.9–10.0)

## 2013-07-26 LAB — IRON AND TIBC CHCC
%SAT: 14 % — ABNORMAL LOW (ref 21–57)
Iron: 59 ug/dL (ref 41–142)
TIBC: 424 ug/dL (ref 236–444)
UIBC: 365 ug/dL (ref 120–384)

## 2013-07-26 LAB — CHCC SATELLITE - SMEAR

## 2013-07-26 LAB — FERRITIN CHCC: Ferritin: 12 ng/ml (ref 9–269)

## 2013-07-26 NOTE — Progress Notes (Signed)
Hematology and Oncology Follow Up Visit  Vaneta Hammontree [id number] 1966/01/29 48 y.o. 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  Cadmus is by for followup. This is her second office visit. All of her lab work with her remarkable for first saw her. I suspect that the leukopenia and was secondary to her at this. I s supposed also could have been from her rheumatoid arthritis. She is on Plaquenil.   She's had no problems with fever. There's been no infections. She's had no rashes. She's had no change in bowel or bladder habits. She's had no cough. There's been no mouth sores. Have to Medications: Current outpatient prescriptions:adalimumab (HUMIRA PEN) 40 MG/0.8ML injection, Inject 40 mg into the skin every 14 (fourteen) days. , Disp: , Rfl: ;  albuterol (PROAIR HFA) 108 (90 BASE) MCG/ACT inhaler, Inhale 2 puffs into the lungs every 6 (six) hours as needed. For shortness of breath and wheezing, Disp: , Rfl: ;  cetirizine (ZYRTEC) 10 MG tablet, TAKE ONE TABLET BY MOUTH EVERY DAY, Disp: 30 tablet, Rfl: 11 Cholecalciferol (VITAMIN D) 1000 UNITS capsule, Take 1,000 Units by mouth daily.  , Disp: , Rfl: ;  Diphenhyd-Hydrocort-Nystatin (FIRST-DUKES MOUTHWASH) SUSP, 1 teaspoon swish and swallow three times daily as needed, Disp: 237 mL, Rfl: 1;  fluticasone (FLONASE) 50 MCG/ACT nasal spray, Place 2 sprays into the nose daily., Disp: 16 g, Rfl: 6;  guaiFENesin (MUCINEX) 600 MG 12 hr tablet, Take 1,200 mg by mouth as needed., Disp: , Rfl:  hydroxychloroquine (PLAQUENIL) 200 MG tablet, Take 200 mg by mouth 2 (two) times daily., Disp: , Rfl: ;  levothyroxine (SYNTHROID, LEVOTHROID) 75 MCG tablet, TAKE 1 TABLET EVERY DAY, Disp: 30 tablet, Rfl: 0;  meloxicam (MOBIC) 15 MG tablet, Take 1 tablet (15 mg total) by mouth daily as needed for  pain. Take with food, Disp: 30 tablet, Rfl: 0;  Multiple Vitamins-Calcium (ONE-A-DAY WOMENS FORMULA) TABS, Take 1 tablet by mouth daily., Disp: , Rfl:  omeprazole (PRILOSEC) 40 MG capsule, TAKE 1 CAPSULE TWICE DAILY, Disp: 180 capsule, Rfl: 1;  polyethylene glycol powder (GLYCOLAX/MIRALAX) powder, as directed., Disp: , Rfl: ;  Probiotic Product (ALIGN) 4 MG CAPS, Take 1 capsule by mouth daily. While on antibiotic, Disp: 30 capsule, Rfl: 0;  promethazine (PHENERGAN) 12.5 MG tablet, Take 12.5 mg by mouth every 6 (six) hours as needed for nausea., Disp: , Rfl:  sodium chloride (OCEAN) 0.65 % nasal spray, Place 2 sprays into the nose as needed., Disp: 30 mL, Rfl: 6;  Spacer/Aero-Holding Chambers (AEROCHAMBER MV) inhaler, Use as instructed, Disp: 1 each, Rfl: 0;  tiZANidine (ZANAFLEX) 4 MG tablet, Take 4 mg by mouth every 8 (eight) hours as needed. DO NOT EXCEED 3 DOSES IN A 24 HOUR PERIOD , Disp: , Rfl: ;  topiramate (TOPAMAX) 100 MG tablet, Take 100 mg by mouth at bedtime. , Disp: , Rfl:   Allergies:  Allergies  Allergen Reactions  . Methotrexate Derivatives     Caused her WBC to drop    Past Medical History, Surgical history, Social history, and Family History were reviewed and updated.  Review of Systems: Above  Physical Exam:  height is 5\' 5"  (1.651 m) and weight is 143 lb (64.864 kg). Her oral temperature is 98.1 F (36.7 C). Her blood pressure is 145/81 and her pulse is  50. Her respiration is 14.   No ocular or oral lesions. No adenopathy. Thyroid non-palpable. Lungs clear. Cardiac exam regular rate and rhythm. No murmurs. Abdomen soft. No palpable liver or spleen tip. Back exam no tenderness. Extremities no clubbing cyanosis or edema. She may have some joint swelling. There is no joint warmth or redness. Skin exam no rashes. Neurological exam nonfocal.  Lab Results  Component Value Date   WBC 2.8* 07/26/2013   HGB 11.2* 07/26/2013   HCT 35.5 07/26/2013   MCV 86 07/26/2013   PLT 199  07/26/2013     Chemistry      Component Value Date/Time   NA 138 03/20/2013 0739   K 4.0 03/20/2013 0739   CL 109 03/20/2013 0739   CO2 23 03/20/2013 0739   BUN 11 03/20/2013 0739   CREATININE 0.8 03/20/2013 0739      Component Value Date/Time   CALCIUM 9.7 03/20/2013 0739   ALKPHOS 43 03/20/2013 0739   AST 29 03/20/2013 0739   ALT 16 03/20/2013 0739   BILITOT 0.8 03/20/2013 0739      On her blood smear, she did maturation of her white blood cells. She has a slight increase in monocytes. I do not see any immature cells. Red cells are mature. No nucleated red cells. Platelets are adequate.   Impression and Plan: Ms. Carrozza is a 27 oral African American female. She has transient leukopenia. Again, I have to believe this is all reflective of her ethnicity. I just do not see anything that looks troublesome to me. Her blood smear is benign.  I don't see that we have to do a bone marrow test on her. I don't see that any additional x-rays need to be done.  I'll plan to see her back in another 6 months. If all was stable in 6 months, that we can let her go from the clinic.   Volanda Napoleon, MD 3/11/20158:49 AM

## 2013-07-27 ENCOUNTER — Other Ambulatory Visit: Payer: Self-pay | Admitting: Pulmonary Disease

## 2013-07-27 DIAGNOSIS — E039 Hypothyroidism, unspecified: Secondary | ICD-10-CM

## 2013-07-27 DIAGNOSIS — D649 Anemia, unspecified: Secondary | ICD-10-CM

## 2013-07-27 DIAGNOSIS — J329 Chronic sinusitis, unspecified: Secondary | ICD-10-CM

## 2013-07-29 LAB — RETICULOCYTES (CHCC)
ABS Retic: 33.6 10*3/uL (ref 19.0–186.0)
RBC.: 4.2 MIL/uL (ref 3.87–5.11)
Retic Ct Pct: 0.8 % (ref 0.4–2.3)

## 2013-07-29 LAB — TRANSFERRIN RECEPTOR, SOLUABLE: Transferrin Receptor, Soluble: 3.31 mg/L — ABNORMAL HIGH (ref 0.76–1.76)

## 2013-07-29 LAB — HEMOGLOBINOPATHY EVALUATION
Hemoglobin Other: 0 %
Hgb A2 Quant: 2.3 % (ref 2.2–3.2)
Hgb A: 97.7 % (ref 96.8–97.8)
Hgb F Quant: 0 % (ref 0.0–2.0)
Hgb S Quant: 0 %

## 2013-07-29 LAB — ERYTHROPOIETIN: Erythropoietin: 46.5 m[IU]/mL — ABNORMAL HIGH (ref 2.6–18.5)

## 2013-08-09 ENCOUNTER — Telehealth: Payer: Self-pay | Admitting: *Deleted

## 2013-08-09 ENCOUNTER — Other Ambulatory Visit: Payer: Self-pay | Admitting: *Deleted

## 2013-08-09 ENCOUNTER — Telehealth: Payer: Self-pay | Admitting: Hematology & Oncology

## 2013-08-09 DIAGNOSIS — D649 Anemia, unspecified: Secondary | ICD-10-CM

## 2013-08-09 NOTE — Telephone Encounter (Signed)
Pt made 4-3 iron appointment

## 2013-08-09 NOTE — Telephone Encounter (Signed)
Message copied by Rico Ala on Wed Aug 09, 2013 11:25 AM ------      Message from: Burney Gauze R      Created: Thu Jul 27, 2013  6:33 AM       Call - iron is actually low.  We need to give her a dose of IV iron. I do not think oral iron will work because of the meds she is on. Please set up [facility] 1020mg  x 1 dose in 1-3 weeks. pete ------

## 2013-08-09 NOTE — Telephone Encounter (Signed)
Message left on personalized VM to contact office for IV iron infusion per Dr PE note. dph

## 2013-08-18 ENCOUNTER — Ambulatory Visit (HOSPITAL_BASED_OUTPATIENT_CLINIC_OR_DEPARTMENT_OTHER): Payer: BC Managed Care – PPO

## 2013-08-18 VITALS — BP 145/84 | HR 45 | Temp 97.9°F | Resp 20

## 2013-08-18 DIAGNOSIS — D649 Anemia, unspecified: Secondary | ICD-10-CM

## 2013-08-18 MED ORDER — SODIUM CHLORIDE 0.9 % IV SOLN
1020.0000 mg | Freq: Once | INTRAVENOUS | Status: AC
  Administered 2013-08-18: 1020 mg via INTRAVENOUS
  Filled 2013-08-18: qty 34

## 2013-08-18 MED ORDER — SODIUM CHLORIDE 0.9 % IV SOLN
Freq: Once | INTRAVENOUS | Status: AC
  Administered 2013-08-18: 09:00:00 via INTRAVENOUS

## 2013-08-18 NOTE — Patient Instructions (Signed)

## 2013-09-19 ENCOUNTER — Encounter: Payer: Self-pay | Admitting: Pulmonary Disease

## 2013-09-19 ENCOUNTER — Ambulatory Visit (INDEPENDENT_AMBULATORY_CARE_PROVIDER_SITE_OTHER): Payer: BC Managed Care – PPO | Admitting: Pulmonary Disease

## 2013-09-19 VITALS — BP 130/72 | HR 48 | Temp 98.8°F | Ht 68.5 in | Wt 149.2 lb

## 2013-09-19 DIAGNOSIS — E039 Hypothyroidism, unspecified: Secondary | ICD-10-CM

## 2013-09-19 DIAGNOSIS — M069 Rheumatoid arthritis, unspecified: Secondary | ICD-10-CM

## 2013-09-19 DIAGNOSIS — F411 Generalized anxiety disorder: Secondary | ICD-10-CM

## 2013-09-19 DIAGNOSIS — R51 Headache: Secondary | ICD-10-CM

## 2013-09-19 DIAGNOSIS — K219 Gastro-esophageal reflux disease without esophagitis: Secondary | ICD-10-CM

## 2013-09-19 DIAGNOSIS — D649 Anemia, unspecified: Secondary | ICD-10-CM

## 2013-09-19 DIAGNOSIS — K5900 Constipation, unspecified: Secondary | ICD-10-CM

## 2013-09-19 DIAGNOSIS — K589 Irritable bowel syndrome without diarrhea: Secondary | ICD-10-CM

## 2013-09-19 DIAGNOSIS — IMO0001 Reserved for inherently not codable concepts without codable children: Secondary | ICD-10-CM

## 2013-09-19 DIAGNOSIS — Q211 Atrial septal defect: Secondary | ICD-10-CM

## 2013-09-19 DIAGNOSIS — J309 Allergic rhinitis, unspecified: Secondary | ICD-10-CM

## 2013-09-19 DIAGNOSIS — J45909 Unspecified asthma, uncomplicated: Secondary | ICD-10-CM

## 2013-09-19 DIAGNOSIS — Q2111 Secundum atrial septal defect: Secondary | ICD-10-CM

## 2013-09-19 DIAGNOSIS — I10 Essential (primary) hypertension: Secondary | ICD-10-CM

## 2013-09-19 DIAGNOSIS — I495 Sick sinus syndrome: Secondary | ICD-10-CM

## 2013-09-19 NOTE — Patient Instructions (Signed)
Today we updated your med list in our EPIC system...    Continue your current medications the same...  Call for any questions or if I can be of service in any way... 
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
## 2013-12-25 ENCOUNTER — Other Ambulatory Visit: Payer: Self-pay | Admitting: Pulmonary Disease

## 2014-01-31 ENCOUNTER — Other Ambulatory Visit: Payer: BC Managed Care – PPO | Admitting: Lab

## 2014-01-31 ENCOUNTER — Ambulatory Visit: Payer: BC Managed Care – PPO | Admitting: Hematology & Oncology

## 2014-03-08 ENCOUNTER — Other Ambulatory Visit: Payer: Self-pay

## 2014-03-09 ENCOUNTER — Ambulatory Visit (INDEPENDENT_AMBULATORY_CARE_PROVIDER_SITE_OTHER): Payer: BC Managed Care – PPO | Admitting: Internal Medicine

## 2014-03-09 ENCOUNTER — Encounter: Payer: Self-pay | Admitting: Internal Medicine

## 2014-03-09 VITALS — BP 170/85 | HR 49 | Temp 98.3°F | Ht 68.0 in | Wt 145.0 lb

## 2014-03-09 DIAGNOSIS — Z23 Encounter for immunization: Secondary | ICD-10-CM

## 2014-03-09 DIAGNOSIS — M069 Rheumatoid arthritis, unspecified: Secondary | ICD-10-CM

## 2014-03-09 DIAGNOSIS — J4520 Mild intermittent asthma, uncomplicated: Secondary | ICD-10-CM

## 2014-03-09 DIAGNOSIS — J3089 Other allergic rhinitis: Secondary | ICD-10-CM

## 2014-03-09 DIAGNOSIS — D72819 Decreased white blood cell count, unspecified: Secondary | ICD-10-CM

## 2014-03-09 DIAGNOSIS — K219 Gastro-esophageal reflux disease without esophagitis: Secondary | ICD-10-CM

## 2014-03-09 DIAGNOSIS — E038 Other specified hypothyroidism: Secondary | ICD-10-CM

## 2014-03-09 LAB — TSH: TSH: 4.144 u[IU]/mL (ref 0.350–4.500)

## 2014-03-09 MED ORDER — FLUTICASONE PROPIONATE 50 MCG/ACT NA SUSP: 2.0000 | Freq: Every day | NASAL | Status: DC

## 2014-03-09 NOTE — Progress Notes (Signed)
Pre visit review using our clinic review tool, if applicable. No additional management support is needed unless otherwise documented below in the visit note. 

## 2014-03-09 NOTE — Progress Notes (Signed)
Subjective:    Patient ID: Brittany Hodges, female    DOB: 09/03/1965, 48 y.o.   MRN: [id number]  DOS:  03/09/2014 Type of visit - description : Transferring from Dr. Gilda Crease, multiple medical problems, chart review Interval history:  Asthma, on albuterol as needed, uses it  rarely Hypothyroidism, good medication compliance, due for TSH Rheumatoid Arthritis sees Dr. Herold Harms, currently holding Plaquenil and Humira due to problems with the GI tract GI-- IBS , constipation, recently had problems swallowing. Recently had EGD by her GI doctor Leukopenia, saw Dr. Marin Olp, note reviewed, felt to be due to her ethnicity  ROS Denies fever chills, has Nasal congestion, needs a refill on Flonase  Denies chest pain or difficulty breathing No anxiety depression  Past Medical History  Diagnosis Date  . Allergic rhinitis, cause unspecified   . Asthma   . Chest pain, unspecified     cardiac cath 07/31/10: normal coronaries; vigorous LVF  . Atrial septal defect     s/p repair;   Echo 08/07/10: EF 55-60%; mild MR; atrial septal aneurysm; no residual ASD  . Sinoatrial node dysfunction     eval. for chronotropic competence completed in past  . Hypothyroidism   . Esophageal reflux   . Irritable bowel syndrome   . Hematuria, unspecified   . Rheumatoid arthritis(714.0)   . Fibromyalgia   . Anxiety   . Anemia   . Sebaceous cyst   . Hypertension   . Low back pain   . Headache(784.0)   . Scoliosis   . Bradycardia     Past Surgical History  Procedure Laterality Date  . Tubal ligation    . Asd repair      History   Social History  . Marital Status: Single    Spouse Name: N/A    Number of Children: N/A  . Years of Education: N/A   Occupational History  . Not on file.   Social History Main Topics  . Smoking status: Never Smoker   . Smokeless tobacco: Never Used     Comment: never used tobacco  . Alcohol Use: No  . Drug Use: No  . Sexual Activity: Not Currently   Other  Topics Concern  . Not on file   Social History Narrative  . No narrative on file        Medication List       This list is accurate as of: 03/09/14  3:44 PM.  Always use your most recent med list.               AEROCHAMBER MV inhaler  Use as instructed     ALIGN 4 MG Caps  Take 1 capsule by mouth daily. While on antibiotic     cetirizine 10 MG tablet  Commonly known as:  ZYRTEC  TAKE ONE TABLET BY MOUTH EVERY DAY     fluticasone 50 MCG/ACT nasal spray  Commonly known as:  FLONASE  Place 2 sprays into the nose daily.     guaiFENesin 600 MG 12 hr tablet  Commonly known as:  MUCINEX  Take 1,200 mg by mouth as needed.     HUMIRA PEN 40 MG/0.8ML injection  Generic drug:  adalimumab  Inject 40 mg into the skin every 14 (fourteen) days.     hydroxychloroquine 200 MG tablet  Commonly known as:  PLAQUENIL  Take 200 mg by mouth 2 (two) times daily.     levothyroxine 75 MCG tablet  Commonly known as:  SYNTHROID, LEVOTHROID  TAKE 1 TABLET DAILY     meloxicam 15 MG tablet  Commonly known as:  MOBIC  Take 1 tablet (15 mg total) by mouth daily as needed for pain. Take with food     omeprazole 40 MG capsule  Commonly known as:  PRILOSEC  TAKE 1 CAPSULE TWICE DAILY     ONE-A-DAY WOMENS FORMULA Tabs  Take 1 tablet by mouth daily.     polyethylene glycol powder powder  Commonly known as:  GLYCOLAX/MIRALAX  as directed.     PROAIR HFA 108 (90 BASE) MCG/ACT inhaler  Generic drug:  albuterol  Inhale 2 puffs into the lungs every 6 (six) hours as needed. For shortness of breath and wheezing     sodium chloride 0.65 % nasal spray  Commonly known as:  OCEAN  Place 2 sprays into the nose as needed.     tiZANidine 4 MG tablet  Commonly known as:  ZANAFLEX  Take 4 mg by mouth every 8 (eight) hours as needed. DO NOT EXCEED 3 DOSES IN A 24 HOUR PERIOD     topiramate 100 MG tablet  Commonly known as:  TOPAMAX  Take 100 mg by mouth at bedtime.     Vitamin D 1000 UNITS  capsule  Take 1,000 Units by mouth daily.           Objective:   Physical Exam BP 170/85  Pulse 49  Temp(Src) 98.3 F (36.8 C) (Oral)  Ht 5\' 8"  (1.727 m)  Wt 145 lb (65.772 kg)  BMI 22.05 kg/m2  SpO2 97%  LMP 02/02/2014  General -- alert, well-developed, NAD.  Neck --no thyromegaly  Lungs -- normal respiratory effort, no intercostal retractions, no accessory muscle use, and normal breath sounds.  Heart-- normal rate, regular rhythm, no murmur.  Abdomen-- Not distended, good bowel sounds,soft, non-tender.  Extremities-- no pretibial edema bilaterally  Neurologic--  alert & oriented X3. Speech normal, gait appropriate for age, strength symmetric and appropriate for age.    Psych-- Cognition and judgment appear intact. Cooperative with normal attention span and concentration. No anxious or depressed appearing.       Assessment & Plan:   New pt, extensive chart review today, F2F > 25 min  Primary care, sees gynecology in [place]  RTC 6 month

## 2014-03-09 NOTE — Patient Instructions (Signed)
Get your blood work before you leave    Please come back to the office in 6 months  for a routine check up , fasting

## 2014-03-10 DIAGNOSIS — D72819 Decreased white blood cell count, unspecified: Secondary | ICD-10-CM | POA: Insufficient documentation

## 2014-03-10 NOTE — Assessment & Plan Note (Signed)
RA, followup by Dr. Herold Harms, currently holding medication due to GI issues

## 2014-03-10 NOTE — Assessment & Plan Note (Signed)
Asthma currently well-controlled

## 2014-03-10 NOTE — Assessment & Plan Note (Signed)
Leukopenia, seen by a doctor [place], felt to be a benign condition

## 2014-03-10 NOTE — Assessment & Plan Note (Addendum)
F/u by GI Dr Shana Chute, EGD 01-2014-- narrow esophagus d/t mass effect per report review, they ordered a CT neck-chest and MRI liver, results not available

## 2014-03-10 NOTE — Assessment & Plan Note (Signed)
Request a RF of Flonase

## 2014-03-10 NOTE — Assessment & Plan Note (Signed)
Good med compliance, due for a TSH

## 2014-03-16 ENCOUNTER — Ambulatory Visit (INDEPENDENT_AMBULATORY_CARE_PROVIDER_SITE_OTHER): Payer: BC Managed Care – PPO | Admitting: Cardiology

## 2014-03-16 ENCOUNTER — Encounter: Payer: Self-pay | Admitting: Cardiology

## 2014-03-16 ENCOUNTER — Telehealth: Payer: Self-pay | Admitting: Internal Medicine

## 2014-03-16 VITALS — BP 146/90 | HR 46 | Ht 68.0 in | Wt 148.4 lb

## 2014-03-16 DIAGNOSIS — I495 Sick sinus syndrome: Secondary | ICD-10-CM

## 2014-03-16 DIAGNOSIS — I517 Cardiomegaly: Secondary | ICD-10-CM | POA: Insufficient documentation

## 2014-03-16 DIAGNOSIS — Q211 Atrial septal defect: Secondary | ICD-10-CM

## 2014-03-16 DIAGNOSIS — R079 Chest pain, unspecified: Secondary | ICD-10-CM

## 2014-03-16 DIAGNOSIS — Q2111 Secundum atrial septal defect: Secondary | ICD-10-CM

## 2014-03-16 DIAGNOSIS — I10 Essential (primary) hypertension: Secondary | ICD-10-CM

## 2014-03-16 MED ORDER — LEVOTHYROXINE SODIUM 100 MCG PO TABS: 100.0000 ug | ORAL_TABLET | Freq: Every day | ORAL | Status: DC

## 2014-03-16 NOTE — Progress Notes (Signed)
03/16/2014   PCP: Noralee Space, MD  Now Dr. Larose Kells.   Chief Complaint  Patient presents with  . Follow-up    enlarged heart on MRI    Primary Cardiologist: Dr. Caryl Comes  HPI:  48 year old female with hx of atrial septal defect repair with hx of junctional rhythm followed by Dr. Caryl Comes.  She is here today for abnormal MRI -MRI revealed cardiomegaly.  She has no complaints except ongoing episodic chest pain.  She denies SOB, lightheadedness, syncope, no edema.  The MRI was done for Rt upper quad pain, along with nausea and vomiting.  No acute abd. Process, possible hemosiderosis.    Her last Echo was 2012: Study Conclusions - Left ventricle: The cavity size was normal. Wall thickness was normal. Systolic function was normal. The estimated ejection fraction was in the range of 55% to 60%. Wall motion was normal; there were no regional wall motion abnormalities. Left ventricular diastolic function parameters were normal. - Aortic valve: Trivial regurgitation. - Mitral valve: Mild regurgitation. - Atrial septum: There was an atrial septal aneurysm. Impressions: - No residual ASD noted.  She had myoview in 2011 without ischemia.      Allergies  Allergen Reactions  . Etanercept   . Methotrexate Derivatives     Caused her WBC to drop    Current Outpatient Prescriptions  Medication Sig Dispense Refill  . albuterol (PROAIR HFA) 108 (90 BASE) MCG/ACT inhaler Inhale 2 puffs into the lungs every 6 (six) hours as needed. For shortness of breath and wheezing      . cetirizine (ZYRTEC) 10 MG tablet TAKE ONE TABLET BY MOUTH EVERY DAY  30 tablet  11  . Cholecalciferol (VITAMIN D) 1000 UNITS capsule Take 1,000 Units by mouth daily.        . fluticasone (FLONASE) 50 MCG/ACT nasal spray Place 2 sprays into both nostrils daily.  16 g  12  . guaiFENesin (MUCINEX) 600 MG 12 hr tablet Take 1,200 mg by mouth as needed.      Marland Kitchen levothyroxine (SYNTHROID, LEVOTHROID) 100 MCG tablet Take 1  tablet (100 mcg total) by mouth daily.  90 tablet  1  . meloxicam (MOBIC) 15 MG tablet Take 1 tablet (15 mg total) by mouth daily as needed for pain. Take with food  30 tablet  0  . Multiple Vitamins-Calcium (ONE-A-DAY WOMENS FORMULA) TABS Take 1 tablet by mouth daily.      Marland Kitchen omeprazole (PRILOSEC) 40 MG capsule TAKE 1 CAPSULE TWICE DAILY  180 capsule  1  . polyethylene glycol powder (GLYCOLAX/MIRALAX) powder every other day.       . Probiotic Product (ALIGN) 4 MG CAPS Take 1 capsule by mouth daily. While on antibiotic  30 capsule  0  . sodium chloride (OCEAN) 0.65 % nasal spray Place 2 sprays into the nose as needed.  30 mL  6  . Spacer/Aero-Holding Chambers (AEROCHAMBER MV) inhaler Use as instructed  1 each  0  . tiZANidine (ZANAFLEX) 4 MG tablet Take 4 mg by mouth every 8 (eight) hours as needed. DO NOT EXCEED 3 DOSES IN A 24 HOUR PERIOD        No current facility-administered medications for this visit.    Past Medical History  Diagnosis Date  . Allergic rhinitis, cause unspecified   . Asthma   . Chest pain, unspecified     cardiac cath 07/31/10: normal coronaries; vigorous LVF  . Atrial septal defect  s/p repair;   Echo 08/07/10: EF 55-60%; mild MR; atrial septal aneurysm; no residual ASD  . Sinoatrial node dysfunction     eval. for chronotropic competence completed in past  . Hypothyroidism   . Esophageal reflux   . Irritable bowel syndrome   . Hematuria, unspecified   . Rheumatoid arthritis(714.0)     Dr Herold Harms  . Fibromyalgia   . Anxiety   . Anemia   . Sebaceous cyst   . Hypertension     no meds   . Low back pain   . Headache(784.0)   . Scoliosis     Past Surgical History  Procedure Laterality Date  . Tubal ligation    . Asd repair  1985    HUT:MLYYTKP:TW colds or fevers, no weight changes Skin:no rashes or ulcers HEENT:no blurred vision, no congestion CV:see HPI PUL:see HPI GI:no diarrhea constipation or melena, no indigestion GU:no hematuria, no  dysuria MS:no joint pain, no claudication Neuro:no syncope, no lightheadedness Endo:no diabetes, + thyroid disease recent adjustment of thyroid med.  Wt Readings from Last 3 Encounters:  03/16/14 148 lb 6.4 oz (67.314 kg)  03/09/14 145 lb (65.772 kg)  09/19/13 149 lb 3.2 oz (67.677 kg)    PHYSICAL EXAM BP 146/90  Pulse 46  Ht 5\' 8"  (1.727 m)  Wt 148 lb 6.4 oz (67.314 kg)  BMI 22.57 kg/m2  LMP 02/02/2014 General:Pleasant affect, NAD Skin:Warm and dry, brisk capillary refill HEENT:normocephalic, sclera clear, mucus membranes moist Neck:supple, no JVD, no bruits  Heart:S1S2 RRR with 2/6 systolic murmur, no gallup, rub or click Lungs:clear without rales, rhonchi, or wheezes SFK:CLEX, non tender, + BS, do not palpate liver spleen or masses Ext:no lower ext edema, 2+ pedal pulses, 2+ radial pulses Neuro:alert and oriented X 3, MAE, follows commands, + facial symmetry  EKG: SB rate of 42, no acute changes from Jan. 2015.  ASSESSMENT AND PLAN Cardiomegaly, by MRI Plan for echo to eval. Cardiac status.  Essential hypertension stable  SINOATRIAL NODE DYSFUNCTION stable  Chest pain Similar chronic chest pain, no changes with EKG  ATRIAL SEPTAL DEFECT Repair years ago.    Follow up with Dr. Caryl Comes in 1 month  Though if issues with echo will address earlier.

## 2014-03-16 NOTE — Assessment & Plan Note (Signed)
stable °

## 2014-03-16 NOTE — Addendum Note (Signed)
Addended by: Wilfrid Lund on: 03/16/2014 02:24 PM   Modules accepted: Orders, Medications

## 2014-03-16 NOTE — Assessment & Plan Note (Signed)
Similar chronic chest pain, no changes with EKG

## 2014-03-16 NOTE — Patient Instructions (Signed)
Your physician recommends that you schedule a follow-up appointment in: 1 Month with Dr Caryl Comes  Your physician has requested that you have an echocardiogram. Echocardiography is a painless test that uses sound waves to create images of your heart. It provides your doctor with information about the size and shape of your heart and how well your heart's chambers and valves are working. This procedure takes approximately one hour. There are no restrictions for this procedure.

## 2014-03-16 NOTE — Assessment & Plan Note (Signed)
Repair years ago.

## 2014-03-16 NOTE — Telephone Encounter (Signed)
Caller name: Koya Relation to pt: Call back number: [phone] Pharmacy:  Reason for call: Pt states was in office 10.23.15 and had lab work done. Pt wants to know to results of labs. Please advise.

## 2014-03-16 NOTE — Assessment & Plan Note (Signed)
Plan for echo to eval. Cardiac status.

## 2014-03-16 NOTE — Telephone Encounter (Signed)
Spoke with Pt, informed her of lab results, please see lab notes.

## 2014-03-20 ENCOUNTER — Ambulatory Visit (HOSPITAL_COMMUNITY): Payer: BC Managed Care – PPO | Attending: Cardiology | Admitting: Radiology

## 2014-03-20 DIAGNOSIS — I517 Cardiomegaly: Secondary | ICD-10-CM | POA: Diagnosis not present

## 2014-03-20 DIAGNOSIS — I10 Essential (primary) hypertension: Secondary | ICD-10-CM | POA: Diagnosis not present

## 2014-03-20 NOTE — Progress Notes (Signed)
Echocardiogram performed.  

## 2014-03-21 ENCOUNTER — Encounter: Payer: Self-pay | Admitting: *Deleted

## 2014-03-27 ENCOUNTER — Encounter: Payer: Self-pay | Admitting: Internal Medicine

## 2014-03-27 NOTE — Telephone Encounter (Signed)
Follow up    Patient calling stating she returning call back to nurse.

## 2014-03-27 NOTE — Telephone Encounter (Signed)
This encounter was created in error - please disregard.

## 2014-04-01 ENCOUNTER — Other Ambulatory Visit: Payer: Self-pay | Admitting: Pulmonary Disease

## 2014-04-23 ENCOUNTER — Ambulatory Visit (INDEPENDENT_AMBULATORY_CARE_PROVIDER_SITE_OTHER): Payer: BC Managed Care – PPO | Admitting: Internal Medicine

## 2014-04-23 ENCOUNTER — Encounter: Payer: Self-pay | Admitting: Internal Medicine

## 2014-04-23 VITALS — BP 146/80 | HR 41 | Ht 68.5 in | Wt 142.8 lb

## 2014-04-23 DIAGNOSIS — I517 Cardiomegaly: Secondary | ICD-10-CM

## 2014-04-23 DIAGNOSIS — I495 Sick sinus syndrome: Secondary | ICD-10-CM

## 2014-04-23 NOTE — Patient Instructions (Signed)
Your physician recommends that you continue on your current medications as directed. Please refer to the Current Medication list given to you today.  Your physician wants you to follow-up in: 2 years with Dr. Klein.  You will receive a reminder letter in the mail two months in advance. If you don't receive a letter, please call our office to schedule the follow-up appointment.  

## 2014-04-23 NOTE — Progress Notes (Signed)
Patient Care Team: Noralee Space, MD as PCP - General (Pulmonary Disease) Deboraha Sprang, MD as Consulting Physician (Cardiology)   HPI  Brittany Hodges is a 48 y.o. female is seen in followup for junctional rhythm in the setting of atrial septal defect repair. In the past she has had assessments of chrontropic competence which have been adequate.  Exercise recently has done pretty well. She does have mild limitations but they seem to be somewhat less than previously.  She continues to have episodes of chest discomfort. These are unrelated to exertion. She is exercising 3-4 days per week at the gym  She underwent myoview scanning in the fall 2011 demonstrating mild ischemia v attenusation with EF 52% and min LV dilitation.  She's been evaluated at [facility] with an upper GI endoscopy for her chest pains. She was found to have "her esophagus pressing against her spine"    Past Medical History  Diagnosis Date  . Allergic rhinitis, cause unspecified   . Asthma   . Chest pain, unspecified     cardiac cath 07/31/10: normal coronaries; vigorous LVF  . Atrial septal defect     s/p repair;   Echo 08/07/10: EF 55-60%; mild MR; atrial septal aneurysm; no residual ASD  . Sinoatrial node dysfunction     eval. for chronotropic competence completed in past  . Hypothyroidism   . Esophageal reflux   . Irritable bowel syndrome   . Hematuria, unspecified   . Rheumatoid arthritis(714.0)     Dr Herold Harms  . Fibromyalgia   . Anxiety   . Anemia   . Sebaceous cyst   . Hypertension     no meds   . Low back pain   . Headache(784.0)   . Scoliosis     Past Surgical History  Procedure Laterality Date  . Tubal ligation    . Asd repair  [id number]    Current Outpatient Prescriptions  Medication Sig Dispense Refill  . Adalimumab (HUMIRA) 40 MG/0.8ML PSKT Inject into the skin as needed (every other week).    Marland Kitchen albuterol (PROAIR HFA) 108 (90 BASE) MCG/ACT inhaler Inhale 2 puffs into the lungs  every 6 (six) hours as needed. For shortness of breath and wheezing    . cetirizine (ZYRTEC) 10 MG tablet TAKE ONE TABLET BY MOUTH EVERY DAY 30 tablet 11  . Cholecalciferol (VITAMIN D) 1000 UNITS capsule Take 1,000 Units by mouth daily.      . fluticasone (FLONASE) 50 MCG/ACT nasal spray Place 2 sprays into both nostrils daily. 16 g 12  . guaiFENesin (MUCINEX) 600 MG 12 hr tablet Take 1,200 mg by mouth as needed.    Marland Kitchen levothyroxine (SYNTHROID, LEVOTHROID) 100 MCG tablet Take 1 tablet (100 mcg total) by mouth daily. 90 tablet 1  . meloxicam (MOBIC) 15 MG tablet Take 1 tablet (15 mg total) by mouth daily as needed for pain. Take with food 30 tablet 0  . Multiple Vitamins-Calcium (ONE-A-DAY WOMENS FORMULA) TABS Take 1 tablet by mouth daily.    Marland Kitchen omeprazole (PRILOSEC) 40 MG capsule TAKE 1 CAPSULE TWICE DAILY 180 capsule 1  . polyethylene glycol powder (GLYCOLAX/MIRALAX) powder every other day.     . Probiotic Product (ALIGN) 4 MG CAPS Take 1 capsule by mouth daily. While on antibiotic 30 capsule 0  . sodium chloride (OCEAN) 0.65 % nasal spray Place 2 sprays into the nose as needed. 30 mL 6  . Spacer/Aero-Holding Chambers (AEROCHAMBER MV) inhaler Use as instructed 1 each  0  . tiZANidine (ZANAFLEX) 4 MG tablet Take 4 mg by mouth every 8 (eight) hours as needed. DO NOT EXCEED 3 DOSES IN A 24 HOUR PERIOD      No current facility-administered medications for this visit.    Allergies  Allergen Reactions  . Etanercept   . Methotrexate Derivatives     Caused her WBC to drop    Review of Systems negative except from HPI and PMH  Physical Exam BP 146/80 mmHg  Pulse 41  Ht 5' 8.5" (1.74 m)  Wt 142 lb 12.8 oz (64.774 kg)  BMI 21.39 kg/m2 ,,pes Well developed and nourished in no acute distress HENT normal Neck supple with JVP-flat Clear slwo but Regular rate and rhythm, 2/6 systolic m Abd-soft with active BS No Clubbing cyanosis edema Skin-warm and dry A & Oriented  Grossly normal sensory  and motor function  ECG demonstrates sinus rhythm 43 Intervals 14/096   Assessment and  Plan  Sinus node dysfunction   April septal defect repair  Hypertension  Chest pain  Her chest pains are atypical. She is undergoing evaluation by physicians at [facility]; nothing has been elucidated. Blood pressure remains or line elevated. She describes it today to a sinus rub lung. Notably he was up also about a month ago. If it persists as elevated, we will need to undertake therapy. She will follow-up with Dr. Leroy Sea about this.  Sinus node issues seem not to be affecting her functional status.

## 2014-04-25 ENCOUNTER — Telehealth: Payer: Self-pay | Admitting: Internal Medicine

## 2014-04-25 NOTE — Telephone Encounter (Signed)
Advise patient, she is due for a TSH, please arrange 

## 2014-04-25 NOTE — Telephone Encounter (Signed)
Letter printed and mailed to Pt.  

## 2014-04-30 LAB — HM MAMMOGRAPHY

## 2014-04-30 LAB — HM PAP SMEAR

## 2014-05-10 ENCOUNTER — Ambulatory Visit (INDEPENDENT_AMBULATORY_CARE_PROVIDER_SITE_OTHER): Payer: BC Managed Care – PPO | Admitting: Internal Medicine

## 2014-05-10 ENCOUNTER — Encounter: Payer: Self-pay | Admitting: Internal Medicine

## 2014-05-10 VITALS — BP 138/82 | HR 39 | Temp 98.1°F | Wt 145.8 lb

## 2014-05-10 DIAGNOSIS — E039 Hypothyroidism, unspecified: Secondary | ICD-10-CM

## 2014-05-10 DIAGNOSIS — J3089 Other allergic rhinitis: Secondary | ICD-10-CM

## 2014-05-10 DIAGNOSIS — K219 Gastro-esophageal reflux disease without esophagitis: Secondary | ICD-10-CM

## 2014-05-10 DIAGNOSIS — D649 Anemia, unspecified: Secondary | ICD-10-CM

## 2014-05-10 DIAGNOSIS — M069 Rheumatoid arthritis, unspecified: Secondary | ICD-10-CM

## 2014-05-10 LAB — TSH: TSH: 3.56 u[IU]/mL (ref 0.35–4.50)

## 2014-05-10 NOTE — Progress Notes (Signed)
Pre visit review using our clinic review tool, if applicable. No additional management support is needed unless otherwise documented below in the visit note. 

## 2014-05-10 NOTE — Progress Notes (Signed)
Subjective:    Patient ID: Brittany Hodges, female    DOB: 07/11/1965, 48 y.o.   MRN: [id number]  DOS:  05/10/2014 Type of visit - description : rov Interval history: Hypothyroidism, good compliance w/ medication, needs her TSH checked. Has mild sinus congestion, yesterday noted a decreased left ear hearing. Denies pain or discharge.    ROS Denies fever chills runny nose or sore throat. No nausea, vomiting, diarrhea blood in the stools. No cough, sputum production.   Past Medical History  Diagnosis Date  . Allergic rhinitis, cause unspecified   . Asthma   . Chest pain, unspecified     cardiac cath 07/31/10: normal coronaries; vigorous LVF  . Atrial septal defect     s/p repair;   Echo 08/07/10: EF 55-60%; mild MR; atrial septal aneurysm; no residual ASD  . Sinoatrial node dysfunction     eval. for chronotropic competence completed in past  . Hypothyroidism   . Esophageal reflux   . Irritable bowel syndrome   . Hematuria, unspecified   . Rheumatoid arthritis(714.0)     Dr Herold Harms  . Fibromyalgia   . Anemia   . Hypertension     no meds   . Low back pain   . Headache(784.0)   . Scoliosis     Past Surgical History  Procedure Laterality Date  . Tubal ligation    . Asd repair  1985    History   Social History  . Marital Status: Single    Spouse Name: N/A    Number of Children: 3  . Years of Education: N/A   Occupational History  . Curator   Social History Main Topics  . Smoking status: Never Smoker   . Smokeless tobacco: Never Used     Comment: never used tobacco  . Alcohol Use: No  . Drug Use: No  . Sexual Activity: Not Currently   Other Topics Concern  . Not on file   Social History Narrative   Household-- pt and youngest son         Medication List       This list is accurate as of: 05/10/14 11:59 PM.  Always use your most recent med list.               AEROCHAMBER MV inhaler  Use as instructed       ALIGN 4 MG Caps  Take 1 capsule by mouth daily. While on antibiotic     cetirizine 10 MG tablet  Commonly known as:  ZYRTEC  TAKE ONE TABLET BY MOUTH EVERY DAY     fluticasone 50 MCG/ACT nasal spray  Commonly known as:  FLONASE  Place 2 sprays into both nostrils daily.     guaiFENesin 600 MG 12 hr tablet  Commonly known as:  MUCINEX  Take 1,200 mg by mouth as needed.     HUMIRA 40 MG/0.8ML Pskt  Generic drug:  Adalimumab  Inject into the skin as needed (every other week).     levothyroxine 100 MCG tablet  Commonly known as:  SYNTHROID, LEVOTHROID  Take 1 tablet (100 mcg total) by mouth daily.     meloxicam 15 MG tablet  Commonly known as:  MOBIC  Take 1 tablet (15 mg total) by mouth daily as needed for pain. Take with food     omeprazole 40 MG capsule  Commonly known as:  PRILOSEC  TAKE 1 CAPSULE TWICE DAILY     ONE-A-DAY WOMENS FORMULA Tabs  Take 1 tablet by mouth daily.     polyethylene glycol powder powder  Commonly known as:  GLYCOLAX/MIRALAX  every other day.     PROAIR HFA 108 (90 BASE) MCG/ACT inhaler  Generic drug:  albuterol  Inhale 2 puffs into the lungs every 6 (six) hours as needed. For shortness of breath and wheezing     sodium chloride 0.65 % nasal spray  Commonly known as:  OCEAN  Place 2 sprays into the nose as needed.     tiZANidine 4 MG tablet  Commonly known as:  ZANAFLEX  Take 4 mg by mouth every 8 (eight) hours as needed. DO NOT EXCEED 3 DOSES IN A 24 HOUR PERIOD     Vitamin D 1000 UNITS capsule  Take 1,000 Units by mouth daily.           Objective:   Physical Exam BP 138/82 mmHg  Pulse 39  Temp(Src) 98.1 F (36.7 C) (Oral)  Wt 145 lb 12.8 oz (66.134 kg)  SpO2 99% General -- alert, well-developed, NAD.   HEENT-- Not pale.  R Ear-- normal L ear--  small amount of debris at the ear canal, most of it was removed with a spoon. TM normal  Lungs -- normal respiratory effort, no intercostal retractions, no accessory muscle use,  and normal breath sounds.  Heart-- normal rate, regular rhythm, no murmur.  Neurologic--  alert & oriented X3. Speech normal, gait appropriate for age, strength symmetric and appropriate for age.  Psych-- Cognition and judgment appear intact. Cooperative with normal attention span and concentration. No anxious or depressed appearing.        Assessment & Plan:

## 2014-05-10 NOTE — Assessment & Plan Note (Addendum)
Had her hemoglobin checked at [facility] this month, it was 11.2, stable. Previous iron studies normal. Per chart review had a negative colonoscopy 04/2011

## 2014-05-10 NOTE — Assessment & Plan Note (Signed)
Compliance with medications, check a TSH

## 2014-05-10 NOTE — Assessment & Plan Note (Signed)
Has some sinus congestion but no evidence of bacterial infection. Recommend to continue using the nose sprays. She also has some debris   at the left ear that was removed with a spoon, no evidence of infection. She is immunosuppressed, recommend to call if there is any worsening of symptoms

## 2014-05-10 NOTE — Assessment & Plan Note (Addendum)
See  previous entry, after  further eval she was told that the spine was producing the mass effect on the esophagus

## 2014-05-10 NOTE — Patient Instructions (Signed)
Get your blood work before you leave   Continue using your nose sprays, Mucinex as needed; if the sinus congestion worse, you have fever, chills or green nasal discharge please let us know  Use peroxide few drops at the left ear as needed. If you develop ear pain, ear discharge let us know.   Please come back to the office in 6 months  for a routine check up

## 2014-05-10 NOTE — Assessment & Plan Note (Signed)
Patient restarted Humira, plans to see her rheumatologist soon

## 2014-07-09 ENCOUNTER — Encounter: Payer: Self-pay | Admitting: Internal Medicine

## 2014-07-09 ENCOUNTER — Ambulatory Visit (INDEPENDENT_AMBULATORY_CARE_PROVIDER_SITE_OTHER): Payer: BLUE CROSS/BLUE SHIELD | Admitting: Internal Medicine

## 2014-07-09 VITALS — BP 148/93 | HR 78 | Temp 98.3°F | Ht 69.0 in | Wt 140.1 lb

## 2014-07-09 DIAGNOSIS — R079 Chest pain, unspecified: Secondary | ICD-10-CM

## 2014-07-09 DIAGNOSIS — K219 Gastro-esophageal reflux disease without esophagitis: Secondary | ICD-10-CM

## 2014-07-09 NOTE — Assessment & Plan Note (Signed)
Patient brought a copy of the EGD November 2015. It confirms that the esophagus was okay and the narrowing was likely extrinsic pressure from the spine. Endoscopist suggest to repeat the CT of the neck and chest but I don't have those reports. Patient was told by Dr. Norville Haggard that that's up to the PCP. Plan: Get reports from CT chest and neck and see if it is appropriate to repeat scans ---------------------------------------------------------------------- EGD from 04/02/2014: Narrowing of proximal esophagus, question of extrinsic extrinsic compression, EUS showing no intrinsic esophageal mass but the spine was seen in close proximity  to the esophagus probably resulting in extrinsic compression. the endoscopist recommend repeat CT neck and chest in 3 months. Dr. Delrae Alfred

## 2014-07-09 NOTE — Patient Instructions (Signed)
Please sign a release of information for [facility], need to get the CT scan reports  of your neck and chest  Next  visit here in 4 months

## 2014-07-09 NOTE — Progress Notes (Signed)
Pre visit review using our clinic review tool, if applicable. No additional management support is needed unless otherwise documented below in the visit note. 

## 2014-07-09 NOTE — Progress Notes (Signed)
Subjective:    Patient ID: Brittany Hodges, female    DOB: 1965/09/07, 49 y.o.   MRN: [id number]  DOS:  07/09/2014 Type of visit - description : Hospital follow-up Interval history: Last week, had a 15 second episode of chest pain, at the anterior mid chest, describes described as a squeezing feeling, no radiation. Went to the ER in [facility], her BP was elevated and heart rate low, she was admitted, multiple tests were run, everything was okay.  Also, has a history of dysphagia, she brought the report of a EGD from 04/02/2014: Narrowing of proximal esophagus, question of extrinsic extrinsic compression, EUS showing no intrinsic esophageal mass but the spine was seen in close proximity  to the esophagus probably resulting in extrinsic compression. the endoscopist recommend repeat CT neck and chest in 3 months. Dr. Delrae Alfred  Review of Systems   Past Medical History  Diagnosis Date  . Allergic rhinitis, cause unspecified   . Asthma   . Chest pain, unspecified     cardiac cath 07/31/10: normal coronaries; vigorous LVF  . Atrial septal defect     s/p repair;   Echo 08/07/10: EF 55-60%; mild MR; atrial septal aneurysm; no residual ASD  . Sinoatrial node dysfunction     eval. for chronotropic competence completed in past  . Hypothyroidism   . Esophageal reflux   . Irritable bowel syndrome   . Hematuria, unspecified   . Rheumatoid arthritis(714.0)     Dr Herold Harms  . Fibromyalgia   . Anemia   . Hypertension     no meds   . Low back pain   . Headache(784.0)   . Scoliosis     Past Surgical History  Procedure Laterality Date  . Tubal ligation    . Asd repair  1985    History   Social History  . Marital Status: Single    Spouse Name: N/A  . Number of Children: 3  . Years of Education: N/A   Occupational History  . Curator   Social History Main Topics  . Smoking status: Never Smoker   . Smokeless tobacco: Never Used     Comment: never  used tobacco  . Alcohol Use: No  . Drug Use: No  . Sexual Activity: Not Currently   Other Topics Concern  . Not on file   Social History Narrative   Household-- pt and youngest son         Medication List       This list is accurate as of: 07/09/14  9:20 PM.  Always use your most recent med list.               AEROCHAMBER MV inhaler  Use as instructed     ALIGN 4 MG Caps  Take 1 capsule by mouth daily. While on antibiotic     amLODipine 5 MG tablet  Commonly known as:  NORVASC  Take 5 mg by mouth daily.     cetirizine 10 MG tablet  Commonly known as:  ZYRTEC  TAKE ONE TABLET BY MOUTH EVERY DAY     fluticasone 50 MCG/ACT nasal spray  Commonly known as:  FLONASE  Place 2 sprays into both nostrils daily.     HUMIRA 40 MG/0.8ML Pskt  Generic drug:  Adalimumab  Inject into the skin as needed (every other week).     levothyroxine 100 MCG tablet  Commonly known as:  SYNTHROID, LEVOTHROID  Take 1 tablet (100 mcg total) by  mouth daily.     meloxicam 15 MG tablet  Commonly known as:  MOBIC  Take 1 tablet (15 mg total) by mouth daily as needed for pain. Take with food     omeprazole 40 MG capsule  Commonly known as:  PRILOSEC  TAKE 1 CAPSULE TWICE DAILY     ONE-A-DAY WOMENS FORMULA Tabs  Take 1 tablet by mouth daily.     polyethylene glycol powder powder  Commonly known as:  GLYCOLAX/MIRALAX  every other day.     PROAIR HFA 108 (90 BASE) MCG/ACT inhaler  Generic drug:  albuterol  Inhale 2 puffs into the lungs every 6 (six) hours as needed. For shortness of breath and wheezing     sodium chloride 0.65 % nasal spray  Commonly known as:  OCEAN  Place 2 sprays into the nose as needed.     tiZANidine 4 MG tablet  Commonly known as:  ZANAFLEX  Take 4 mg by mouth every 8 (eight) hours as needed. DO NOT EXCEED 3 DOSES IN A 24 HOUR PERIOD     Vitamin D 1000 UNITS capsule  Take 1,000 Units by mouth daily.           Objective:   Physical Exam BP 148/93  mmHg  Pulse 78  Temp(Src) 98.3 F (36.8 C) (Oral)  Ht 5\' 9"  (1.753 m)  Wt 140 lb 2 oz (63.56 kg)  BMI 20.68 kg/m2  SpO2 96%  LMP 06/17/2014  General:   Well developed, well nourished . NAD.  HEENT:  Normocephalic . Face symmetric, atraumatic  neck without mass or lymphadenopathy Lungs:  CTA B Normal respiratory effort, no intercostal retractions, no accessory muscle use. Heart: RRR,  no murmur.  Abdomen:  Not distended, soft, non-tender. No rebound or rigidity. No mass,organomegaly Muscle skeletal: no pretibial edema bilaterally  Skin: Not pale. Not jaundice Neurologic:  alert & oriented X3.  Speech normal, gait appropriate for age and unassisted Psych--  Cognition and judgment appear intact.  Cooperative with normal attention span and concentration.  Behavior appropriate. No anxious or depressed appearing.      Assessment & Plan:

## 2014-07-10 ENCOUNTER — Telehealth: Payer: Self-pay | Admitting: Internal Medicine

## 2014-07-10 NOTE — Telephone Encounter (Signed)
Advise patient, I review her reports from the hospital. Needs to see her cardiologist, be sure she has an appointment, if not please arrange one. Recommend to increase amlodipine to 10 mg one by mouth daily, monitor BPs to 3 times a week, goal less than 145/85.

## 2014-07-10 NOTE — Assessment & Plan Note (Signed)
  Chest pain, chronic issue, usually atypical, negative exercise stress test 2013. Admitted to an outside hospital with chest pain, elevated BP and low heart rate. She is currently asymptomatic, BP today slightly elevated. We'll wait for records. Addendum, records reviewed: Admitted 07/02/2014 with atypical chest pain, her BP was seen to 160s, 190s. She was noted to be bradycardic. She ruled out by serial negative troponins and a nondiagnostic EKG. Was a started on amlodipine. She was discharged home with a plan to get an outpatient stress test by cardiology. No actual lab results available. We'll contact the patient and be sure she has cardiology follow-up, also increase amlodipine to 10 mg, monitor BPs

## 2014-07-11 MED ORDER — AMLODIPINE BESYLATE 10 MG PO TABS: 10.0000 mg | ORAL_TABLET | Freq: Every day | ORAL | Status: DC

## 2014-07-11 NOTE — Telephone Encounter (Signed)
LMOM informing Pt of Dr. Larose Kells recommendations. Amlodipine increased to 10 mg 1 tablet daily( sent to [facility]). Informed her to monitor BPs at least 3 times weekly, her goal is to be less than 145/85. Pt has appt scheduled with Dr. Caryl Comes (cardio) 08/06/2014 at 10:30 AM.

## 2014-07-23 ENCOUNTER — Telehealth: Payer: Self-pay | Admitting: Internal Medicine

## 2014-07-23 NOTE — Telephone Encounter (Signed)
Okay to cut amlodipine 10 mg to half tablet daily, continue monitoring BPs

## 2014-07-23 NOTE — Telephone Encounter (Signed)
Caller name:Athalie Marshell Levan Relationship to patient:self Can be [phone]   Reason for call: PT wanting to know if she should increase her BP meds- states BP is lower now and wants to wean off the meds. Recent 07/13/14.. 143/88 07/14/14 ...118/77 07/17/14... 127/76.

## 2014-07-23 NOTE — Telephone Encounter (Signed)
FYI. Please advise.

## 2014-07-23 NOTE — Telephone Encounter (Signed)
Tried calling Pt to inform her that she can decrease her Amlodipine to 1/2 tablet daily and to continue monitoring BPs. Pt's son stated he currently had her phone and gave me a number to call- [phone]. Unable to reach Pt on that phone as well.

## 2014-07-31 ENCOUNTER — Telehealth: Payer: Self-pay | Admitting: Internal Medicine

## 2014-07-31 NOTE — Telephone Encounter (Signed)
Spoke with Pt, informed her she can take 1/2 tablet of Amlodipine and continue monitoring BPs. Pt verbalized understanding. Pt also wanted to know where she can receive a form for a "medical alert" bracelet, Pt states she lost her form. Informed her I wasn't sure but would ask around the office. Pt verbalized understanding.

## 2014-07-31 NOTE — Telephone Encounter (Signed)
Pt is returning your call please call back

## 2014-07-31 NOTE — Telephone Encounter (Signed)
Error/gd °

## 2014-07-31 NOTE — Telephone Encounter (Signed)
Informed Pt that she may get a medical alert bracelet at certain [facility] and [facility]. Pt verbalized understanding and informed me she would look into it.

## 2014-07-31 NOTE — Telephone Encounter (Signed)
I don't know what form she is referring to.

## 2014-08-02 ENCOUNTER — Other Ambulatory Visit: Payer: Self-pay | Admitting: Pulmonary Disease

## 2014-08-06 ENCOUNTER — Encounter: Payer: Self-pay | Admitting: Internal Medicine

## 2014-08-06 ENCOUNTER — Ambulatory Visit (INDEPENDENT_AMBULATORY_CARE_PROVIDER_SITE_OTHER): Payer: BLUE CROSS/BLUE SHIELD | Admitting: Internal Medicine

## 2014-08-06 VITALS — BP 120/76 | HR 42 | Ht 68.5 in | Wt 139.4 lb

## 2014-08-06 DIAGNOSIS — I4589 Other specified conduction disorders: Secondary | ICD-10-CM

## 2014-08-06 NOTE — Progress Notes (Signed)
Patient Care Team: Colon Branch, MD as PCP - General (Internal Medicine) Deboraha Sprang, MD as Consulting Physician (Cardiology)   HPI  Brittany Hodges is a 49 y.o. female is seen in followup for junctional rhythm in the setting of atrial septal defect repair. In the past she has had assessments of chrontropic competence which have been adequate.  Exercise recently has done pretty well. She does have mild limitations but they seem to be somewhat less than previously.  She continues to have episodes of chest discomfort. These are unrelated to exertion. She is exercising 3-4 days per week at the gym  She has noted decreasing exercise tolerance   She was seen in [facility] because of chest pain. Blood testing was negative.   She underwent myoview scanning in the fall 2011 demonstrating mild ischemia v attenusation with EF 52% and min LV dilitation.     Past Medical History  Diagnosis Date  . Allergic rhinitis, cause unspecified   . Asthma   . Chest pain, unspecified     cardiac cath 07/31/10: normal coronaries; vigorous LVF  . Atrial septal defect     s/p repair;   Echo 08/07/10: EF 55-60%; mild MR; atrial septal aneurysm; no residual ASD  . Sinoatrial node dysfunction     eval. for chronotropic competence completed in past  . Hypothyroidism   . Esophageal reflux   . Irritable bowel syndrome   . Hematuria, unspecified   . Rheumatoid arthritis(714.0)     Dr Herold Harms  . Fibromyalgia   . Anemia   . Hypertension     no meds   . Low back pain   . Headache(784.0)   . Scoliosis     Past Surgical History  Procedure Laterality Date  . Tubal ligation    . Asd repair  [id number]    Current Outpatient Prescriptions  Medication Sig Dispense Refill  . Adalimumab (HUMIRA) 40 MG/0.8ML PSKT Inject into the skin as needed (every other week).    Marland Kitchen albuterol (PROAIR HFA) 108 (90 BASE) MCG/ACT inhaler Inhale 2 puffs into the lungs every 6 (six) hours as needed. For shortness  of breath and wheezing    . amLODipine (NORVASC) 10 MG tablet Take 1 tablet (10 mg total) by mouth daily. (Patient taking differently: Take 5 mg by mouth daily. ) 90 tablet 1  . cetirizine (ZYRTEC) 10 MG tablet TAKE ONE TABLET BY MOUTH EVERY DAY 30 tablet 11  . Cholecalciferol (VITAMIN D) 1000 UNITS capsule Take 1,000 Units by mouth daily.      . fluticasone (FLONASE) 50 MCG/ACT nasal spray Place 2 sprays into both nostrils daily. 16 g 12  . levothyroxine (SYNTHROID, LEVOTHROID) 100 MCG tablet Take 1 tablet (100 mcg total) by mouth daily. 90 tablet 1  . meloxicam (MOBIC) 15 MG tablet Take 1 tablet (15 mg total) by mouth daily as needed for pain. Take with food 30 tablet 0  . Multiple Vitamins-Calcium (ONE-A-DAY WOMENS FORMULA) TABS Take 1 tablet by mouth daily.    Marland Kitchen omeprazole (PRILOSEC) 40 MG capsule TAKE 1 CAPSULE TWICE DAILY 180 capsule 1  . polyethylene glycol powder (GLYCOLAX/MIRALAX) powder every other day.     . Probiotic Product (ALIGN) 4 MG CAPS Take 1 capsule by mouth daily. While on antibiotic 30 capsule 0  . sodium chloride (OCEAN) 0.65 % nasal spray Place 2 sprays into the nose as needed. 30 mL 6  . Spacer/Aero-Holding Chambers (AEROCHAMBER MV) inhaler Use as instructed  1 each 0  . tiZANidine (ZANAFLEX) 4 MG tablet Take 4 mg by mouth every 8 (eight) hours as needed. DO NOT EXCEED 3 DOSES IN A 24 HOUR PERIOD      No current facility-administered medications for this visit.    Allergies  Allergen Reactions  . Etanercept   . Methotrexate Derivatives     Caused her WBC to drop  . Pregabalin     Review of Systems negative except from HPI and PMH  Physical Exam   Well developed and nourished in no acute distress HENT normal Neck supple with JVP-flat Clear slwo but Regular rate and rhythm, 2/6 systolic m Abd-soft with active BS No Clubbing cyanosis edema Skin-warm and dry A & Oriented  Grossly normal sensory and motor function  ECG demonstrates sinus rhythm 43 Intervals  14/096   Assessment and  Plan  Sinus node dysfunction   April septal defect repair  Hypertension  Chest pain  Her chest pains are atypical. Evaluation has been negative for cardiac issues here to date.  Exercise tolerance has been worsening. Raises the possibility about worsening chronotropic function. We will have her undergo treadmill testing. We are looking to see if she can accomplish 70-85% of her predicted maximal heart rate

## 2014-08-06 NOTE — Patient Instructions (Signed)
Your physician recommends that you continue on your current medications as directed. Please refer to the Current Medication list given to you today.  Your physician wants you to follow-up in: 12 months with Dr. Caryl Comes. You will receive a reminder letter in the mail two months in advance. If you don't receive a letter, please call our office to schedule the follow-up appointment.  Your physician has requested that you have an exercise tolerance test. For further information please visit [email or web address]. Please also follow instruction sheet, as given.

## 2014-08-08 ENCOUNTER — Other Ambulatory Visit: Payer: Self-pay

## 2014-08-08 ENCOUNTER — Ambulatory Visit (HOSPITAL_COMMUNITY)
Admission: RE | Admit: 2014-08-08 | Discharge: 2014-08-08 | Disposition: A | Discharge status: Home / Self Care | Payer: BLUE CROSS/BLUE SHIELD | Source: Ambulatory Visit | Attending: Internal Medicine | Admitting: Internal Medicine

## 2014-08-08 DIAGNOSIS — R0789 Other chest pain: Secondary | ICD-10-CM | POA: Insufficient documentation

## 2014-08-08 DIAGNOSIS — R55 Syncope and collapse: Secondary | ICD-10-CM | POA: Insufficient documentation

## 2014-08-08 DIAGNOSIS — I498 Other specified cardiac arrhythmias: Secondary | ICD-10-CM | POA: Diagnosis not present

## 2014-08-08 DIAGNOSIS — I4589 Other specified conduction disorders: Secondary | ICD-10-CM

## 2014-08-08 NOTE — Progress Notes (Signed)
Brittany Hodges had ETT today, reached 85% predicted max HR at 7 min 3 sec.  She completed stage 3.  No ST depression or elevation.  No chest pain.  + dizzy at end of test but recovered quickly.  Hr came down <100 in 2 min.

## 2014-09-11 ENCOUNTER — Ambulatory Visit: Payer: BC Managed Care – PPO | Admitting: Internal Medicine

## 2014-09-13 ENCOUNTER — Ambulatory Visit: Payer: BC Managed Care – PPO | Admitting: Internal Medicine

## 2014-10-17 ENCOUNTER — Other Ambulatory Visit: Payer: Self-pay

## 2014-10-17 MED ORDER — OMEPRAZOLE 40 MG PO CPDR: 40.0000 mg | DELAYED_RELEASE_CAPSULE | Freq: Two times a day (BID) | ORAL | Status: DC

## 2014-10-24 ENCOUNTER — Other Ambulatory Visit: Payer: Self-pay

## 2014-10-24 MED ORDER — AMLODIPINE BESYLATE 10 MG PO TABS: 5.0000 mg | ORAL_TABLET | Freq: Every day | ORAL | Status: DC

## 2014-11-09 ENCOUNTER — Ambulatory Visit: Payer: BC Managed Care – PPO | Admitting: Internal Medicine

## 2014-11-26 ENCOUNTER — Other Ambulatory Visit: Payer: Self-pay

## 2014-11-26 MED ORDER — AMLODIPINE BESYLATE 10 MG PO TABS: 5.0000 mg | ORAL_TABLET | Freq: Every day | ORAL | Status: DC

## 2014-12-21 ENCOUNTER — Ambulatory Visit (INDEPENDENT_AMBULATORY_CARE_PROVIDER_SITE_OTHER): Payer: BLUE CROSS/BLUE SHIELD | Admitting: Internal Medicine

## 2014-12-21 ENCOUNTER — Encounter: Payer: Self-pay | Admitting: Internal Medicine

## 2014-12-21 VITALS — BP 124/78 | HR 39 | Temp 98.1°F | Ht 69.0 in | Wt 142.0 lb

## 2014-12-21 DIAGNOSIS — I10 Essential (primary) hypertension: Secondary | ICD-10-CM

## 2014-12-21 DIAGNOSIS — I517 Cardiomegaly: Secondary | ICD-10-CM

## 2014-12-21 DIAGNOSIS — Z0000 Encounter for general adult medical examination without abnormal findings: Secondary | ICD-10-CM | POA: Insufficient documentation

## 2014-12-21 DIAGNOSIS — R079 Chest pain, unspecified: Secondary | ICD-10-CM

## 2014-12-21 DIAGNOSIS — E039 Hypothyroidism, unspecified: Secondary | ICD-10-CM

## 2014-12-21 DIAGNOSIS — Z114 Encounter for screening for human immunodeficiency virus [HIV]: Secondary | ICD-10-CM

## 2014-12-21 LAB — LIPID PANEL
Cholesterol: 148 mg/dL (ref 0–200)
HDL: 63.1 mg/dL (ref >39.00)
LDL Cholesterol: 72 mg/dL (ref 0–99)
NonHDL: 84.59
Total CHOL/HDL Ratio: 2
Triglycerides: 61 mg/dL (ref 0.0–149.0)
VLDL: 12.2 mg/dL (ref 0.0–40.0)

## 2014-12-21 LAB — TSH: TSH: 5.59 u[IU]/mL — ABNORMAL HIGH (ref 0.35–4.50)

## 2014-12-21 MED ORDER — LEVOTHYROXINE SODIUM 100 MCG PO TABS
100.0000 ug | ORAL_TABLET | Freq: Every day | ORAL | Status: DC
Start: 2014-12-21 — End: 2014-12-28

## 2014-12-21 MED ORDER — OMEPRAZOLE 40 MG PO CPDR: 40.0000 mg | DELAYED_RELEASE_CAPSULE | Freq: Two times a day (BID) | ORAL | Status: DC

## 2014-12-21 MED ORDER — AMLODIPINE BESYLATE 5 MG PO TABS: 5.0000 mg | ORAL_TABLET | Freq: Every day | ORAL | Status: DC

## 2014-12-21 NOTE — Progress Notes (Signed)
Pre visit review using our clinic review tool, if applicable. No additional management support is needed unless otherwise documented below in the visit note. 

## 2014-12-21 NOTE — Assessment & Plan Note (Signed)
Declined pneumonia shot, likes to discuss with rheumatology. She is on immunosuppressants, knows she is high risk for pneumonias Female care: Sees gynecology in [facility]. Has mammograms there.  Colon cancer screening: Had colonoscopies done by GI in [place] Recommend to come back in 8 months for formal physical exam

## 2014-12-21 NOTE — Assessment & Plan Note (Signed)
Had a echocardiogram 03-2014, good results.

## 2014-12-21 NOTE — Assessment & Plan Note (Signed)
Well-controlled with amlodipine 5 mg daily. Gets blood work at rheumatology regularly.

## 2014-12-21 NOTE — Assessment & Plan Note (Signed)
Reports no recent symptoms.

## 2014-12-21 NOTE — Patient Instructions (Signed)
Get your blood work before you leave    Check the  blood pressure 2 or 3 times a month  Be sure your blood pressure is between 110/65 and  145/85.  if it is consistently higher or lower, let me know   Discuss a pneumonia shot with your rheumatologist

## 2014-12-21 NOTE — Progress Notes (Signed)
Subjective:    Patient ID: Brittany Hodges, female    DOB: 06/30/1965, 49 y.o.   MRN: [id number]  DOS:  12/21/2014 Type of visit - description : Routine office visit Interval history:  Hypothyroidism, good compliance medication, due for a TSH. Needs a refill Request her cholesterol to be checked Hypertension: On amlodipine 10 mg half tablet daily, ambulatory BPs very well, likes to change to amlodipine 5 mg one tablet daily  Review of Systems  Denies difficulty breathing or lower extremity edema No nausea, vomiting, diarrhea or blood in the stools.  Past Medical History  Diagnosis Date  . Allergic rhinitis, cause unspecified   . Asthma   . Chest pain, unspecified     cardiac cath 07/31/10: normal coronaries; vigorous LVF  . Atrial septal defect     s/p repair;   Echo 08/07/10: EF 55-60%; mild MR; atrial septal aneurysm; no residual ASD  . Sinoatrial node dysfunction     eval. for chronotropic competence completed in past  . Hypothyroidism   . Esophageal reflux   . Irritable bowel syndrome   . Hematuria, unspecified   . Rheumatoid arthritis(714.0)     Dr Herold Harms  . Fibromyalgia   . Anemia   . Hypertension     no meds   . Low back pain   . Headache(784.0)   . Scoliosis     Past Surgical History  Procedure Laterality Date  . Tubal ligation    . Asd repair  1985    History   Social History  . Marital Status: Single    Spouse Name: N/A  . Number of Children: 3  . Years of Education: N/A   Occupational History  . Curator   Social History Main Topics  . Smoking status: Never Smoker   . Smokeless tobacco: Never Used     Comment: never used tobacco  . Alcohol Use: No  . Drug Use: No  . Sexual Activity: Not Currently   Other Topics Concern  . Not on file   Social History Narrative   Household-- pt and youngest son         Medication List       This list is accurate as of: 12/21/14  8:53 AM.  Always use your most  recent med list.               AEROCHAMBER MV inhaler  Use as instructed     ALIGN 4 MG Caps  Take 1 capsule by mouth daily. While on antibiotic     amLODipine 10 MG tablet  Commonly known as:  NORVASC  Take 0.5 tablets (5 mg total) by mouth daily.     cetirizine 10 MG tablet  Commonly known as:  ZYRTEC  TAKE ONE TABLET BY MOUTH EVERY DAY     fluticasone 50 MCG/ACT nasal spray  Commonly known as:  FLONASE  Place 2 sprays into both nostrils daily.     HUMIRA 40 MG/0.8ML Pskt  Generic drug:  Adalimumab  Inject into the skin as needed (every other week).     levothyroxine 100 MCG tablet  Commonly known as:  SYNTHROID, LEVOTHROID  Take 1 tablet (100 mcg total) by mouth daily.     meloxicam 15 MG tablet  Commonly known as:  MOBIC  Take 1 tablet (15 mg total) by mouth daily as needed for pain. Take with food     omeprazole 40 MG capsule  Commonly known as:  PRILOSEC  Take 1 capsule (40 mg total) by mouth 2 (two) times daily.     ONE-A-DAY WOMENS FORMULA Tabs  Take 1 tablet by mouth daily.     polyethylene glycol powder powder  Commonly known as:  GLYCOLAX/MIRALAX  every other day.     PROAIR HFA 108 (90 BASE) MCG/ACT inhaler  Generic drug:  albuterol  Inhale 2 puffs into the lungs every 6 (six) hours as needed. For shortness of breath and wheezing     sodium chloride 0.65 % nasal spray  Commonly known as:  OCEAN  Place 2 sprays into the nose as needed.     tiZANidine 4 MG tablet  Commonly known as:  ZANAFLEX  Take 4 mg by mouth every 8 (eight) hours as needed. DO NOT EXCEED 3 DOSES IN A 24 HOUR PERIOD     Vitamin D 1000 UNITS capsule  Take 1,000 Units by mouth daily.           Objective:   Physical Exam BP 124/78 mmHg  Pulse 39  Temp(Src) 98.1 F (36.7 C) (Oral)  Ht 5\' 9"  (1.753 m)  Wt 142 lb (64.411 kg)  BMI 20.96 kg/m2  SpO2 97%  LMP 12/15/2014 General:   Well developed, well nourished . NAD.  HEENT:  Normocephalic . Face symmetric,  atraumatic Lungs:  CTA B Normal respiratory effort, no intercostal retractions, no accessory muscle use. Heart: RRR,  no murmur.  no pretibial edema bilaterally  Abdomen:  Not distended, soft, non-tender. No rebound or rigidity. No mass,organomegaly Skin: Not pale. Not jaundice Neurologic:  alert & oriented X3.  Speech normal, gait appropriate for age and unassisted Psych--  Cognition and judgment appear intact.  Cooperative with normal attention span and concentration.  Behavior appropriate. No anxious or depressed appearing.    Assessment & Plan:

## 2014-12-22 LAB — HIV ANTIBODY (ROUTINE TESTING W REFLEX): HIV 1&2 Ab, 4th Generation: NONREACTIVE

## 2014-12-26 ENCOUNTER — Ambulatory Visit: Payer: BLUE CROSS/BLUE SHIELD | Admitting: Internal Medicine

## 2014-12-28 MED ORDER — LEVOTHYROXINE SODIUM 125 MCG PO TABS: 125.0000 ug | ORAL_TABLET | Freq: Every day | ORAL | Status: DC

## 2014-12-28 NOTE — Addendum Note (Signed)
Addended by: Wilfrid Lund on: 12/28/2014 08:59 AM   Modules accepted: Orders

## 2014-12-28 NOTE — Addendum Note (Signed)
Addended by: Wilfrid Lund on: 12/28/2014 08:57 AM   Modules accepted: Orders, Medications

## 2015-01-03 ENCOUNTER — Other Ambulatory Visit: Payer: Self-pay

## 2015-02-15 ENCOUNTER — Other Ambulatory Visit (INDEPENDENT_AMBULATORY_CARE_PROVIDER_SITE_OTHER): Payer: BLUE CROSS/BLUE SHIELD

## 2015-02-15 DIAGNOSIS — E039 Hypothyroidism, unspecified: Secondary | ICD-10-CM

## 2015-02-16 LAB — TSH: TSH: 0.256 u[IU]/mL — ABNORMAL LOW (ref 0.350–4.500)

## 2015-02-18 NOTE — Addendum Note (Signed)
Addended by: Wilfrid Lund on: 02/18/2015 08:08 AM   Modules accepted: Orders

## 2015-03-08 ENCOUNTER — Encounter: Payer: Self-pay | Admitting: Internal Medicine

## 2015-03-08 ENCOUNTER — Ambulatory Visit (INDEPENDENT_AMBULATORY_CARE_PROVIDER_SITE_OTHER): Payer: BLUE CROSS/BLUE SHIELD | Admitting: Internal Medicine

## 2015-03-08 VITALS — BP 116/66 | HR 49 | Temp 98.2°F | Ht 69.0 in | Wt 140.1 lb

## 2015-03-08 DIAGNOSIS — J069 Acute upper respiratory infection, unspecified: Secondary | ICD-10-CM | POA: Diagnosis not present

## 2015-03-08 DIAGNOSIS — Z23 Encounter for immunization: Secondary | ICD-10-CM | POA: Diagnosis not present

## 2015-03-08 DIAGNOSIS — E039 Hypothyroidism, unspecified: Secondary | ICD-10-CM

## 2015-03-08 DIAGNOSIS — Z09 Encounter for follow-up examination after completed treatment for conditions other than malignant neoplasm: Secondary | ICD-10-CM

## 2015-03-08 LAB — TSH: TSH: 1.23 u[IU]/mL (ref 0.350–4.500)

## 2015-03-08 MED ORDER — ALBUTEROL SULFATE HFA 108 (90 BASE) MCG/ACT IN AERS: 2.0000 | INHALATION_SPRAY | Freq: Four times a day (QID) | RESPIRATORY_TRACT | Status: DC | PRN

## 2015-03-08 MED ORDER — AZELASTINE HCL 0.1 % NA SOLN: 2.0000 | Freq: Every evening | NASAL | Status: DC | PRN

## 2015-03-08 MED ORDER — AMOXICILLIN 500 MG PO CAPS: 1000.0000 mg | ORAL_CAPSULE | Freq: Two times a day (BID) | ORAL | Status: DC

## 2015-03-08 NOTE — Progress Notes (Signed)
Pre visit review using our clinic review tool, if applicable. No additional management support is needed unless otherwise documented below in the visit note. 

## 2015-03-08 NOTE — Progress Notes (Signed)
Subjective:    Patient ID: Brittany Hodges, female    DOB: May 31, 1965, 49 y.o.   MRN: [id number]  DOS:  03/08/2015 Type of visit - description : Acute visit Interval history: Symptoms started 2 weeks ago with mild sinus pain and congestion bilaterally. Left ear feels clogged. History of asthma, no wheezing but felt slightly short of breath lately. Has not used albuterol years.  Hypothyroidism: Good compliance with medicines.  Review of Systems  No fever chills No chest pain, lower extremity edema + Mild cough, dry. No nausea, vomiting, diarrhea  Past Medical History  Diagnosis Date  . Allergic rhinitis, cause unspecified   . Asthma   . Chest pain, unspecified     cardiac cath 07/31/10: normal coronaries; vigorous LVF  . Atrial septal defect     s/p repair;   Echo 08/07/10: EF 55-60%; mild MR; atrial septal aneurysm; no residual ASD  . Sinoatrial node dysfunction (HCC)     eval. for chronotropic competence completed in past  . Hypothyroidism   . Esophageal reflux   . Irritable bowel syndrome   . Hematuria, unspecified   . Rheumatoid arthritis(714.0)     Dr Herold Harms  . Fibromyalgia   . Anemia   . Hypertension     no meds   . Low back pain   . Headache(784.0)   . Scoliosis     Past Surgical History  Procedure Laterality Date  . Tubal ligation    . Asd repair  1985    Social History   Social History  . Marital Status: Single    Spouse Name: N/A  . Number of Children: 3  . Years of Education: N/A   Occupational History  . Curator   Social History Main Topics  . Smoking status: Never Smoker   . Smokeless tobacco: Never Used     Comment: never used tobacco  . Alcohol Use: No  . Drug Use: No  . Sexual Activity: Not Currently   Other Topics Concern  . Not on file   Social History Narrative   Household-- pt and youngest son         Medication List       This list is accurate as of: 03/08/15 11:59 PM.   Always use your most recent med list.               AEROCHAMBER MV inhaler  Use as instructed     albuterol 108 (90 BASE) MCG/ACT inhaler  Commonly known as:  PROAIR HFA  Inhale 2 puffs into the lungs every 6 (six) hours as needed. For shortness of breath and wheezing     amLODipine 5 MG tablet  Commonly known as:  NORVASC  Take 1 tablet (5 mg total) by mouth daily.     amoxicillin 500 MG capsule  Commonly known as:  AMOXIL  Take 2 capsules (1,000 mg total) by mouth 2 (two) times daily.     azelastine 0.1 % nasal spray  Commonly known as:  ASTELIN  Place 2 sprays into both nostrils at bedtime as needed for rhinitis. Use in each nostril as directed     cetirizine 10 MG tablet  Commonly known as:  ZYRTEC  TAKE ONE TABLET BY MOUTH EVERY DAY     fluticasone 50 MCG/ACT nasal spray  Commonly known as:  FLONASE  Place 2 sprays into both nostrils daily.     HUMIRA 40 MG/0.8ML Pskt  Generic drug:  Adalimumab  Inject  into the skin as needed (every other week).     levothyroxine 125 MCG tablet  Commonly known as:  SYNTHROID, LEVOTHROID  Take 1 tablet (125 mcg total) by mouth daily.     meloxicam 15 MG tablet  Commonly known as:  MOBIC  Take 1 tablet (15 mg total) by mouth daily as needed for pain. Take with food     omeprazole 40 MG capsule  Commonly known as:  PRILOSEC  Take 1 capsule (40 mg total) by mouth 2 (two) times daily.     ONE-A-DAY WOMENS FORMULA Tabs  Take 1 tablet by mouth daily.     polyethylene glycol powder powder  Commonly known as:  GLYCOLAX/MIRALAX  every other day.     tiZANidine 4 MG tablet  Commonly known as:  ZANAFLEX  Take 4 mg by mouth every 8 (eight) hours as needed. DO NOT EXCEED 3 DOSES IN A 24 HOUR PERIOD     Vitamin D 1000 UNITS capsule  Take 1,000 Units by mouth daily.           Objective:   Physical Exam BP 116/66 mmHg  Pulse 49  Temp(Src) 98.2 F (36.8 C) (Oral)  Ht 5\' 9"  (1.753 m)  Wt 140 lb 2 oz (63.56 kg)  BMI 20.68  kg/m2  SpO2 96%  LMP 02/28/2015 (Exact Date) General:   Well developed, well nourished . NAD.  HEENT:  Normocephalic . Face symmetric, atraumatic. TMs normal. Throat symmetric, no red. Sinuses: Minimal TTP bilaterally. Lungs:  CTA B Normal respiratory effort, no intercostal retractions, no accessory muscle use. Heart: RRR,  no murmur.  No pretibial edema bilaterally  Skin: Not pale. Not jaundice Neurologic:  alert & oriented X3.  Speech normal, gait appropriate for age and unassisted Psych--  Cognition and judgment appear intact.  Cooperative with normal attention span and concentration.  Behavior appropriate. No anxious or depressed appearing.      Assessment & Plan:   Assessment > HTN Hypothyroidism GERD- dysphagia  EGD from 04/02/2014: Narrowing of proximal esophagus, question of extrinsic extrinsic compression, EUS showing no intrinsic esophageal mass but the spine was seen in close proximity to the esophagus probably resulting in extrinsic compression.  The endoscopist recommend repeat CT neck and chest in 3 months. Dr. Delrae Alfred Asthma, uses alb rarely  CV: --Atrial septal defect: Status post repair, no residual ASD --Sinoatrial  Dysfunction --CP: cath 2012, normal coronaries MSK: --Rheumatoid arthritis, HUMIRA per  Dr. Herold Harms --Fibromyalgia IBS (Miralax prn constipation)  Plan: URI: Recommend conservative treatment, albuterol if wheezing, start antibiotics in 2 or 3 days if not improving. See instructions. Hypothyroidism: Months ago TSH was elevated, Synthroid adjusted to 125 mcg and last TSH was slightly low. Recheck a TSH. Primary care: Pneumonia shot today, flu shot once URI  better

## 2015-03-08 NOTE — Patient Instructions (Signed)
Get your blood work before you leave    Rest, fluids , tylenol  For cough: Take Mucinex DM twice a day as needed until better  For nasal congestion Use OTC  Flonase : 2 nasal sprays on each side of the nose in the morning until you feel better Astelin  nasal spray: 2 sprays on  each side of the nose at night until better  If you develop wheezing: Use albuterol   Take the antibiotic as prescribed  (Amoxicillin) if not improving in the next 2 or 3 days  Call if not gradually better over the next  10 days  Call anytime if the symptoms are severe

## 2015-03-09 DIAGNOSIS — Z09 Encounter for follow-up examination after completed treatment for conditions other than malignant neoplasm: Secondary | ICD-10-CM | POA: Insufficient documentation

## 2015-03-09 NOTE — Assessment & Plan Note (Signed)
URI: Recommend conservative treatment, albuterol if wheezing, start antibiotics in 2 or 3 days if not improving. See instructions. Hypothyroidism: Months ago TSH was elevated, Synthroid adjusted to 125 mcg and last TSH was slightly low. Recheck a TSH. Primary care: Pneumonia shot today, flu shot once URI  better

## 2015-03-11 ENCOUNTER — Telehealth: Payer: Self-pay | Admitting: Internal Medicine

## 2015-03-11 NOTE — Telephone Encounter (Signed)
Caller name: Glynda Relation to pt: self Call back number: [phone] Pharmacy:  Reason for call: Pt called stating having dizziness and with a little light headed going on, and feeling nauseous but states does not have any other bad symptoms, BP was 124/75 pt stated it started just 20 - 30 min. ago, pt was informed if it get worst to go to ER or if she wanted to schedule an appt for tomorrow. Pt wants to wait to make appt for tomorrow morning.

## 2015-03-12 ENCOUNTER — Encounter: Payer: Self-pay | Admitting: Internal Medicine

## 2015-03-12 ENCOUNTER — Telehealth: Payer: Self-pay

## 2015-03-12 ENCOUNTER — Encounter: Payer: Self-pay | Admitting: *Deleted

## 2015-03-12 ENCOUNTER — Telehealth: Payer: Self-pay | Admitting: Internal Medicine

## 2015-03-12 ENCOUNTER — Ambulatory Visit (INDEPENDENT_AMBULATORY_CARE_PROVIDER_SITE_OTHER): Payer: BLUE CROSS/BLUE SHIELD | Admitting: Internal Medicine

## 2015-03-12 VITALS — BP 136/82 | HR 40 | Ht 68.5 in | Wt 139.6 lb

## 2015-03-12 DIAGNOSIS — R42 Dizziness and giddiness: Secondary | ICD-10-CM | POA: Diagnosis not present

## 2015-03-12 NOTE — Telephone Encounter (Signed)
I called and spoke with the patient. She reports that around 4 pm yesterday afternoon, she started to feel dizzy/ lightheaded. Later in the evening, she developed chest/ arm/ back pain. She went to the ER at [facility] for further evaluation. She states that when she was in the ER her BP was "high" and her HR was ~ 42-48 bpm. Her typical HR is usually in the upper 40's. She states that her BP at home, prior to going to the ER, was 124/75. Per her report, she had an EKG, chest CT, labs in the ER. She was told these were normal. She was released from the ER this morning. She called Dr. Ethel Rana office and was told to follow up with cardiology. She is currently at work this morning and is still having some lightheadedness and intermittent fleeting chest pain. I inquired if she has had any recent medication changes- she states Dr. Larose Kells increased her synthroid about 2 weeks ago. She has been staying hydrated. I advised her I would review with Dr. Caryl Comes when he comes in the office this morning and call her back with recommendations. She is agreeable.

## 2015-03-12 NOTE — Telephone Encounter (Signed)
Reviewed with Dr. Caryl Comes- we will evaluate the patient in the office this afternoon and determine further testing if needed. I have notified the patient- appt scheduled for 3:45 pm this afternoon. She is agreeable.

## 2015-03-12 NOTE — Telephone Encounter (Signed)
Can you call and triage Pt? Dr. Larose Kells is completely full today.

## 2015-03-12 NOTE — Patient Instructions (Signed)
Medication Instructions: - no changes  Labwork: - none  Procedures/Testing: - none  Follow-Up: - Your physician recommends that you schedule a follow-up appointment in: March 2017 as scheduled.  Any Additional Special Instructions Will Be Listed Below (If Applicable). - none

## 2015-03-12 NOTE — Telephone Encounter (Signed)
I reviewed the chart, see that cardiology will see her today. Advise patient to call us in few days if needed.

## 2015-03-12 NOTE — Progress Notes (Signed)
Patient Care Team: Colon Branch, MD as PCP - General (Internal Medicine) Deboraha Sprang, MD as Consulting Physician (Cardiology) Bo Merino, MD as Consulting Physician (Rheumatology)   HPI  Brittany Hodges is a 49 y.o. female is seen in followup for junctional rhythm in the setting of atrial septal defect repair. In the past she has had assessments of chrontropic competence which have been adequate.  Exercise recently has done pretty well. She does have mild limitations but they seem to be somewhat less than previously.  She continues to have episodi chest discomfort. These are unrelated to exertion. She is exercising 3-4 days per week at the gym   She was seen in [facility] because of lightheadedness. This occurred yesterday afternoon in the context of nausea and some warmth. It abated with sitting down. She felt well for a few hours and then the symptoms recurred number of hours later prompting her to go to the emergency room. She remained somewhat lightheaded and somewhat nauseated today. Her periods are late. I should note that she did not have a pregnancy test yesterday.  She underwent myoview scanning in the fall 2011 demonstrating mild ischemia v attenusation with EF 52% and min LV dilitation.     Past Medical History  Diagnosis Date  . Allergic rhinitis, cause unspecified   . Asthma   . Chest pain, unspecified     cardiac cath 07/31/10: normal coronaries; vigorous LVF  . Atrial septal defect     s/p repair;   Echo 08/07/10: EF 55-60%; mild MR; atrial septal aneurysm; no residual ASD  . Sinoatrial node dysfunction (HCC)     eval. for chronotropic competence completed in past  . Hypothyroidism   . Esophageal reflux   . Irritable bowel syndrome   . Hematuria, unspecified   . Rheumatoid arthritis(714.0)     Dr Herold Harms  . Fibromyalgia   . Anemia   . Hypertension     no meds   . Low back pain   . Headache(784.0)   . Scoliosis     Past Surgical  History  Procedure Laterality Date  . Tubal ligation    . Asd repair  [id number]    Current Outpatient Prescriptions  Medication Sig Dispense Refill  . Adalimumab (HUMIRA) 40 MG/0.8ML PSKT Inject into the skin as needed (every other week).    Marland Kitchen albuterol (PROAIR HFA) 108 (90 BASE) MCG/ACT inhaler Inhale 2 puffs into the lungs every 6 (six) hours as needed. For shortness of breath and wheezing 1 Inhaler 1  . amLODipine (NORVASC) 5 MG tablet Take 1 tablet (5 mg total) by mouth daily. 90 tablet 3  . amoxicillin (AMOXIL) 500 MG capsule Take 2 capsules (1,000 mg total) by mouth 2 (two) times daily. 28 capsule 0  . azelastine (ASTELIN) 0.1 % nasal spray Place 2 sprays into both nostrils at bedtime as needed for rhinitis. Use in each nostril as directed 30 mL 3  . cetirizine (ZYRTEC) 10 MG tablet TAKE ONE TABLET BY MOUTH EVERY DAY 30 tablet 11  . Cholecalciferol (VITAMIN D) 1000 UNITS capsule Take 1,000 Units by mouth daily.      . fluticasone (FLONASE) 50 MCG/ACT nasal spray Place 2 sprays into both nostrils daily. 16 g 12  . levothyroxine (SYNTHROID, LEVOTHROID) 125 MCG tablet Take 1 tablet (125 mcg total) by mouth daily. 90 tablet 0  . meloxicam (MOBIC) 15 MG tablet Take 1 tablet (15 mg total) by mouth daily as needed  for pain. Take with food 30 tablet 0  . Multiple Vitamins-Calcium (ONE-A-DAY WOMENS FORMULA) TABS Take 1 tablet by mouth daily.    Marland Kitchen omeprazole (PRILOSEC) 40 MG capsule Take 1 capsule (40 mg total) by mouth 2 (two) times daily. 180 capsule 3  . polyethylene glycol powder (GLYCOLAX/MIRALAX) powder every other day.     Marland Kitchen Spacer/Aero-Holding Chambers (AEROCHAMBER MV) inhaler Use as instructed 1 each 0  . tiZANidine (ZANAFLEX) 4 MG tablet Take 4 mg by mouth every 8 (eight) hours as needed. DO NOT EXCEED 3 DOSES IN A 24 HOUR PERIOD      No current facility-administered medications for this visit.    Allergies  Allergen Reactions  . Etanercept   . Methotrexate Derivatives     Caused her  WBC to drop  . Pregabalin     Review of Systems negative except from HPI and PMH  Physical Exam BP 136/82 mmHg  Pulse 40  Ht 5' 8.5" (1.74 m)  Wt 139 lb 9.6 oz (63.322 kg)  BMI 20.91 kg/m2  LMP 02/28/2015 (Exact Date)  Well developed and nourished in no acute distress HENT normal Neck supple with JVP-flat Clear slow but Regular rate and rhythm, 3/6 systolic m Abd-soft with active BS No Clubbing cyanosis edema Skin-warm and dry A & Oriented  Grossly normal sensory and motor function  ECG demonstrates sinus rhythm 40 There is competitive junctional rhythm Intervals 14/09/48   Assessment and  Plan  Sinus node dysfunction   Atrial septal defect repair  Hypertension  Chest pain  Lightheadedness  Her chest pains are atypical. Evaluation has been negative for cardiac issues here to date.  Her chest pains are nonexertional.  Her lightheadedness occurs in the context of nausea. He is also some warmth. It does not sound arrhythmic although in her case with her sinus node dysfunction is certainly could be. We have discussed using a heart rate monitoring APP on her phone to record heart rates in the context of recurrent symptoms.  She is going to get a pregnancy test. I suggested she follow-up with Dr. Larose Kells in the event that the nausea persists

## 2015-03-12 NOTE — Telephone Encounter (Signed)
Noted. Thanks.

## 2015-03-12 NOTE — Telephone Encounter (Signed)
New message      Pt was in the ER last night.  She still is lightheaded and nausea.  ER told her to call Dr Caryl Comes this am

## 2015-03-12 NOTE — Telephone Encounter (Signed)
Ive already called her. Did telephone note.

## 2015-03-12 NOTE — Telephone Encounter (Signed)
Called patient  Back regarding phone call she made to office yesterday. States she went to ED last night due to Dizziness and chest pains radiating to back. States she had EKG,XR,CAT Scan and labs. States she called Dr. Leda Min' office this am and is waiting to see if she can see him today. Patient states she has history of Mitral Valve Prolapse and Bradycardia and was told to call his office if she ever experienced chest pain and dizziness. Advised patient if for any reason she cannot get in to his office to call me back and I would try to get her on todays schedule with a provider. Patient agreed.

## 2015-03-12 NOTE — Telephone Encounter (Signed)
Pt has appt scheduled with Dr. Caryl Comes today at 3:45 PM.

## 2015-03-12 NOTE — Telephone Encounter (Signed)
Just an FYI. Our schedule is completely full.

## 2015-03-12 NOTE — Telephone Encounter (Signed)
Brittany Hodges, please call this patient and follow up with her.

## 2015-05-15 ENCOUNTER — Telehealth: Payer: Self-pay | Admitting: Internal Medicine

## 2015-05-15 NOTE — Telephone Encounter (Signed)
ROI faxed to [facility] records rec back placed in [place] bin.

## 2015-05-16 ENCOUNTER — Telehealth: Payer: Self-pay

## 2015-05-16 NOTE — Telephone Encounter (Signed)
Gave requested records to [facility]

## 2015-05-17 ENCOUNTER — Other Ambulatory Visit: Payer: Self-pay | Admitting: Internal Medicine

## 2015-05-22 ENCOUNTER — Telehealth: Payer: Self-pay | Admitting: Internal Medicine

## 2015-05-22 NOTE — Telephone Encounter (Signed)
Levothyroxine was refilled to [facility] on 05/21/2015, #90 and 1 refill.

## 2015-05-22 NOTE — Telephone Encounter (Signed)
Caller name: Anarose  Relationship to patient: self  Can be reached: [phone]  Pharmacy: [place], [place]  Reason for call: pt is requesting a refill on her levothyroxine rx

## 2015-05-23 ENCOUNTER — Encounter: Payer: Self-pay | Admitting: Internal Medicine

## 2015-05-23 ENCOUNTER — Telehealth: Payer: Self-pay

## 2015-05-24 ENCOUNTER — Ambulatory Visit: Payer: BLUE CROSS/BLUE SHIELD

## 2015-05-24 ENCOUNTER — Encounter: Payer: Self-pay | Admitting: Internal Medicine

## 2015-05-24 ENCOUNTER — Ambulatory Visit (INDEPENDENT_AMBULATORY_CARE_PROVIDER_SITE_OTHER): Payer: BLUE CROSS/BLUE SHIELD | Admitting: Internal Medicine

## 2015-05-24 VITALS — BP 132/78 | HR 42 | Temp 98.4°F | Ht 68.5 in | Wt 136.2 lb

## 2015-05-24 DIAGNOSIS — Z23 Encounter for immunization: Secondary | ICD-10-CM | POA: Diagnosis not present

## 2015-05-24 DIAGNOSIS — E039 Hypothyroidism, unspecified: Secondary | ICD-10-CM

## 2015-05-24 DIAGNOSIS — R1310 Dysphagia, unspecified: Secondary | ICD-10-CM | POA: Diagnosis not present

## 2015-05-24 DIAGNOSIS — Z0000 Encounter for general adult medical examination without abnormal findings: Secondary | ICD-10-CM | POA: Diagnosis not present

## 2015-05-24 LAB — LIPID PANEL
Cholesterol: 141 mg/dL (ref 0–200)
HDL: 56.2 mg/dL (ref >39.00)
LDL Cholesterol: 74 mg/dL (ref 0–99)
NonHDL: 85.23
Total CHOL/HDL Ratio: 3
Triglycerides: 56 mg/dL (ref 0.0–149.0)
VLDL: 11.2 mg/dL (ref 0.0–40.0)

## 2015-05-24 LAB — TSH: TSH: 1.14 u[IU]/mL (ref 0.35–4.50)

## 2015-05-24 NOTE — Progress Notes (Signed)
Subjective:    Patient ID: Brittany Hodges, female    DOB: 10-Apr-1966, 50 y.o.   MRN: [id number]  DOS:  05/24/2015 Type of visit - description : CPX Interval history: Good compliance with medications   Review of Systems Constitutional: No fever. No chills. No unexplained wt changes. No unusual sweats  HEENT: No dental problems, no ear discharge, no facial swelling, no voice changes. No eye discharge, no eye  redness , no  intolerance to light   Respiratory: No wheezing , no  difficulty breathing. No cough , no mucus production  Cardiovascular: No CP, no leg swelling , no  Palpitations  GI: no nausea, no vomiting, no diarrhea , no  abdominal pain.  No blood in the stools. Continue with dysphagia to solids.  Endocrine: No polyphagia, no polyuria , no polydipsia  GU: No dysuria, gross hematuria, difficulty urinating. No urinary urgency, no frequency.  Musculoskeletal: No joint swellings or unusual aches or pains  Skin: No change in the color of the skin, palor , no  Rash  Allergic, immunologic: Occasional allergies. Sometimes when she eats cicatrix has a tingling at the tongue but no swelling, rash or itching. Neurological: No dizziness no  syncope. No headaches. No diplopia, no slurred, no slurred speech, no motor deficits, no facial  Numbness  Hematological: No enlarged lymph nodes, no easy bruising , no unusual bleedings  Psychiatry: No suicidal ideas, no hallucinations, no beavior problems, no confusion.  No unusual/severe anxiety, no depression   Past Medical History  Diagnosis Date  . Allergic rhinitis, cause unspecified   . Asthma   . Chest pain, unspecified     cardiac cath 07/31/10: normal coronaries; vigorous LVF  . Atrial septal defect     s/p repair;   Echo 08/07/10: EF 55-60%; mild MR; atrial septal aneurysm; no residual ASD  . Sinoatrial node dysfunction (HCC)     eval. for chronotropic competence completed in past  . Hypothyroidism   . Esophageal reflux    . Irritable bowel syndrome   . Hematuria, unspecified   . Rheumatoid arthritis(714.0)     Dr Herold Harms  . Fibromyalgia   . Anemia   . Hypertension     no meds   . Low back pain   . Headache(784.0)   . Scoliosis     Past Surgical History  Procedure Laterality Date  . Tubal ligation    . Asd repair  1985    Social History   Social History  . Marital Status: Single    Spouse Name: N/A  . Number of Children: 3  . Years of Education: N/A   Occupational History  . Curator   Social History Main Topics  . Smoking status: Never Smoker   . Smokeless tobacco: Never Used     Comment: never used tobacco  . Alcohol Use: No  . Drug Use: No  . Sexual Activity: Not Currently   Other Topics Concern  . Not on file   Social History Narrative   Household-- pt and youngest son      Family History  Problem Relation Age of Onset  . Hypertension Mother   . Heart disease Maternal Grandmother   . Colon cancer Neg Hx   . Breast cancer Other     aunt  . Diabetes Other     GM       Medication List       This list is accurate as of: 05/24/15 11:59 PM.  Always use your most recent med list.               AEROCHAMBER MV inhaler  Use as instructed     albuterol 108 (90 Base) MCG/ACT inhaler  Commonly known as:  PROAIR HFA  Inhale 2 puffs into the lungs every 6 (six) hours as needed. For shortness of breath and wheezing     amLODipine 5 MG tablet  Commonly known as:  NORVASC  Take 1 tablet (5 mg total) by mouth daily.     amoxicillin 500 MG capsule  Commonly known as:  AMOXIL  Take 2 capsules (1,000 mg total) by mouth 2 (two) times daily.     azelastine 0.1 % nasal spray  Commonly known as:  ASTELIN  Place 2 sprays into both nostrils at bedtime as needed for rhinitis. Use in each nostril as directed     cetirizine 10 MG tablet  Commonly known as:  ZYRTEC  TAKE ONE TABLET BY MOUTH EVERY DAY     fluticasone 50 MCG/ACT nasal  spray  Commonly known as:  FLONASE  Place 2 sprays into both nostrils daily.     HUMIRA 40 MG/0.8ML Pskt  Generic drug:  Adalimumab  Inject into the skin as needed (every other week).     levothyroxine 125 MCG tablet  Commonly known as:  SYNTHROID, LEVOTHROID  Take 1 tablet (125 mcg total) by mouth daily before breakfast.     omeprazole 40 MG capsule  Commonly known as:  PRILOSEC  Take 1 capsule (40 mg total) by mouth 2 (two) times daily.     ONE-A-DAY WOMENS FORMULA Tabs  Take 1 tablet by mouth daily.     polyethylene glycol powder powder  Commonly known as:  GLYCOLAX/MIRALAX  every other day.     Vitamin D 1000 units capsule  Take 1,000 Units by mouth daily.           Objective:   Physical Exam BP 132/78 mmHg  Pulse 42  Temp(Src) 98.4 F (36.9 C) (Oral)  Ht 5' 8.5" (1.74 m)  Wt 136 lb 4 oz (61.803 kg)  BMI 20.41 kg/m2  SpO2 99%  LMP 05/17/2015 (Exact Date) General:   Well developed, well nourished . NAD.  HEENT:  Normocephalic . Face symmetric, atraumatic Neck: No thyromegaly  Lungs:  CTA B Normal respiratory effort, no intercostal retractions, no accessory muscle use. Heart: RRR,  no murmur.  no pretibial edema bilaterally  Abdomen:   Not distended, soft, non-tender. No rebound or rigidity Skin: Not pale. Not jaundice Neurologic:  alert & oriented X3.  Speech normal, gait appropriate for age and unassisted Psych--  Cognition and judgment appear intact.  Cooperative with normal attention span and concentration.  Behavior appropriate. No anxious or depressed appearing.    Assessment & Plan:   Assessment > HTN Hypothyroidism GERD- dysphagia -Dr. Norville Haggard EGD from 04/02/2014 (Dr Delrae Alfred): Narrowing of proximal esophagus, question of extrinsic extrinsic compression, EUS showing no intrinsic esophageal mass but the spine was seen in close proximity to the esophagus probably resulting in extrinsic compression. The endoscopist recommend repeat CT neck and  chest in 3 months.saw ENT 04-2015 for dysphagia,had a scope,  rx PPI Asthma, uses alb rarely  CV: --Atrial septal defect: Status post repair, no residual ASD --Sinoatrial  Dysfunction, Dr Caryl Comes OV 02-2015 --CP: cath 2012, normal coronaries MSK: --Rheumatoid arthritis, HUMIRA per  Dr. Herold Harms --Fibromyalgia IBS (Miralax prn constipation)  Plan:  HTN: Well-controlled Hypothyroidism: Check a TSH. Dysphagia: Ongoing symptoms,  recommend to see her GI, Dr. Monia Sabal, referral sent --at some point was recommended by CT neck and chest if needed. RTC one year

## 2015-05-24 NOTE — Assessment & Plan Note (Signed)
Td 2012, Pneumovax October 2016 Flu shot today Female care: Sees gynecology in [place]. Has mammograms there. CCS: Cscope  05/07/2011 negative, 10 years, Dr. Shana Chute  diet and exercise discussed Labs done last month per pt at rheumatology, reportedly a CMP and CBC. Will check FLP, vit d  and TSH

## 2015-05-24 NOTE — Progress Notes (Signed)
Pre visit review using our clinic review tool, if applicable. No additional management support is needed unless otherwise documented below in the visit note. 

## 2015-05-24 NOTE — Patient Instructions (Signed)
BEFORE YOU LEAVE THE OFFICE:  GO TO THE LAB  Get the blood work    GO TO THE FRONT DESK Schedule a complete physical exam to be done in 1 year Please be fasting   

## 2015-05-28 NOTE — Telephone Encounter (Signed)
Pt confirmed receipt of Rx.    Pt is grateful.   Thanks.

## 2015-05-29 LAB — VITAMIN D 1,25 DIHYDROXY
Vitamin D 1, 25 (OH)2 Total: 50 pg/mL (ref 18–72)
Vitamin D2 1, 25 (OH)2: <8 pg/mL
Vitamin D3 1, 25 (OH)2: 50 pg/mL

## 2015-05-29 NOTE — Telephone Encounter (Signed)
Pre-Visit information 

## 2015-08-06 ENCOUNTER — Encounter: Payer: Self-pay | Admitting: Physician Assistant

## 2015-08-06 ENCOUNTER — Ambulatory Visit (INDEPENDENT_AMBULATORY_CARE_PROVIDER_SITE_OTHER): Payer: BLUE CROSS/BLUE SHIELD | Admitting: Physician Assistant

## 2015-08-06 VITALS — BP 140/78 | HR 46 | Temp 97.9°F | Ht 68.5 in | Wt 134.4 lb

## 2015-08-06 DIAGNOSIS — K5900 Constipation, unspecified: Secondary | ICD-10-CM

## 2015-08-06 NOTE — Progress Notes (Signed)
Patient presents to clinic today c/o 3 days of low back pain with constipation and mild nausea. Denies tenesmus, melena or hematochezia. Denies urinary symptoms. Has stopped coffee recently. Is trying to stay well hydrated. Denies trauma or injury to the back. Did take some Miralax this morning with some BM.  Past Medical History  Diagnosis Date  . Allergic rhinitis, cause unspecified   . Asthma   . Chest pain, unspecified     cardiac cath 07/31/10: normal coronaries; vigorous LVF  . Atrial septal defect     s/p repair;   Echo 08/07/10: EF 55-60%; mild MR; atrial septal aneurysm; no residual ASD  . Sinoatrial node dysfunction (HCC)     eval. for chronotropic competence completed in past  . Hypothyroidism   . Esophageal reflux   . Irritable bowel syndrome   . Hematuria, unspecified   . Rheumatoid arthritis(714.0)     Dr Herold Harms  . Fibromyalgia   . Anemia   . Hypertension     no meds   . Low back pain   . Headache(784.0)   . Scoliosis     Current Outpatient Prescriptions on File Prior to Visit  Medication Sig Dispense Refill  . Adalimumab (HUMIRA) 40 MG/0.8ML PSKT Inject into the skin as needed (every other week).    Marland Kitchen albuterol (PROAIR HFA) 108 (90 BASE) MCG/ACT inhaler Inhale 2 puffs into the lungs every 6 (six) hours as needed. For shortness of breath and wheezing 1 Inhaler 1  . amLODipine (NORVASC) 5 MG tablet Take 1 tablet (5 mg total) by mouth daily. 90 tablet 3  . azelastine (ASTELIN) 0.1 % nasal spray Place 2 sprays into both nostrils at bedtime as needed for rhinitis. Use in each nostril as directed 30 mL 3  . cetirizine (ZYRTEC) 10 MG tablet TAKE ONE TABLET BY MOUTH EVERY DAY 30 tablet 11  . Cholecalciferol (VITAMIN D) 1000 UNITS capsule Take 1,000 Units by mouth daily.      Marland Kitchen levothyroxine (SYNTHROID, LEVOTHROID) 125 MCG tablet Take 1 tablet (125 mcg total) by mouth daily before breakfast. 90 tablet 1  . omeprazole (PRILOSEC) 40 MG capsule Take 1 capsule (40 mg  total) by mouth 2 (two) times daily. 180 capsule 3  . polyethylene glycol powder (GLYCOLAX/MIRALAX) powder every other day.     Marland Kitchen Spacer/Aero-Holding Chambers (AEROCHAMBER MV) inhaler Use as instructed 1 each 0   No current facility-administered medications on file prior to visit.    Allergies  Allergen Reactions  . Etanercept   . Methotrexate Derivatives     Caused her WBC to drop  . Pregabalin     Family History  Problem Relation Age of Onset  . Hypertension Mother   . Heart disease Maternal Grandmother   . Colon cancer Neg Hx   . Breast cancer Other     aunt  . Diabetes Other     GM    Social History   Social History  . Marital Status: Single    Spouse Name: N/A  . Number of Children: 3  . Years of Education: N/A   Occupational History  . Curator   Social History Main Topics  . Smoking status: Never Smoker   . Smokeless tobacco: Never Used     Comment: never used tobacco  . Alcohol Use: No  . Drug Use: No  . Sexual Activity: Not Currently   Other Topics Concern  . None   Social History Narrative   Household-- pt and  youngest son    Review of Systems - See HPI.  All other ROS are negative.  BP 140/78 mmHg  Pulse 46  Temp(Src) 97.9 F (36.6 C) (Oral)  Ht 5' 8.5" (1.74 m)  Wt 134 lb 6.4 oz (60.963 kg)  BMI 20.14 kg/m2  SpO2 99%  LMP 07/22/2015  Physical Exam  Constitutional: She is oriented to person, place, and time and well-developed, well-nourished, and in no distress.  HENT:  Head: Normocephalic and atraumatic.  Eyes: Conjunctivae are normal.  Cardiovascular: Normal rate, regular rhythm, normal heart sounds and intact distal pulses.   Pulmonary/Chest: Effort normal and breath sounds normal. No respiratory distress. She has no wheezes. She has no rales. She exhibits no tenderness.  Abdominal: Soft. Bowel sounds are normal. She exhibits no distension. There is no tenderness. There is no rebound.  Neurological:  She is alert and oriented to person, place, and time.  Skin: Skin is warm and dry. No rash noted.  Psychiatric: Affect normal.  Vitals reviewed.   Recent Results (from the past 2160 hour(s))  Lipid panel     Status: None   Collection Time: 05/24/15  9:21 AM  Result Value Ref Range   Cholesterol 141 0 - 200 mg/dL    Comment: ATP III Classification       Desirable:  < 200 mg/dL               Borderline High:  200 - 239 mg/dL          High:  > = 240 mg/dL   Triglycerides 56.0 0.0 - 149.0 mg/dL    Comment: Normal:  <150 mg/dLBorderline High:  150 - 199 mg/dL   HDL 56.20 >39.00 mg/dL   VLDL 11.2 0.0 - 40.0 mg/dL   LDL Cholesterol 74 0 - 99 mg/dL   Total CHOL/HDL Ratio 3     Comment:                Men          Women1/2 Average Risk     3.4          3.3Average Risk          5.0          4.42X Average Risk          9.6          7.13X Average Risk          15.0          11.0                       NonHDL 85.23     Comment: NOTE:  Non-HDL goal should be 30 mg/dL higher than patient's LDL goal (i.e. LDL goal of < 70 mg/dL, would have non-HDL goal of < 100 mg/dL)  TSH     Status: None   Collection Time: 05/24/15  9:21 AM  Result Value Ref Range   TSH 1.14 0.35 - 4.50 uIU/mL  Vitamin D 1,25 dihydroxy     Status: None   Collection Time: 05/24/15  9:21 AM  Result Value Ref Range   Vitamin D 1, 25 (OH)2 Total 50 18 - 72 pg/mL   Vitamin D3 1, 25 (OH)2 50 pg/mL   Vitamin D2 1, 25 (OH)2 <8 pg/mL    Comment: Vitamin D3, 1,25(OH)2 indicates both endogenous production and supplementation.  Vitamin D2, 1,25(OH)2 is an indicator of exogeous sources, such as diet or supplementation.  Interpretation and therapy are based on measurement of Vitamin D,1,25(OH)2, Total. This test was developed and its analytical performance characteristics have been determined by [facility], [place], [place]. It has not been cleared or approved by the FDA. This assay has been validated pursuant to the  CLIA regulations and is used for clinical purposes.     Assessment/Plan: Constipation BS present in all 4 quadrants. Negative tenderness. Will begin bowel regimen to help clean out colon and to keep BM regular. Follow-up with GI as scheduled.

## 2015-08-06 NOTE — Patient Instructions (Signed)
Please follow the regimen below to help with constipation:  I encourage you to increase hydration and the amount of fiber in your diet.  Start a daily probiotic (Align, Culturelle, Digestive Advantage, etc.). If no bowel movement within 24 hours, take 2 Tbs of Milk of Magnesia in a 4 oz glass of warmed prune juice every 2-3 days to help promote bowel movement. If no results within 24 hours, then repeat above regimen, adding a Dulcolax stool softener to regimen. If this does not promote a bowel movement, please call the office.  Follow-up with your specialists as directed.

## 2015-08-06 NOTE — Assessment & Plan Note (Signed)
BS present in all 4 quadrants. Negative tenderness. Will begin bowel regimen to help clean out colon and to keep BM regular. Follow-up with GI as scheduled.

## 2015-08-06 NOTE — Progress Notes (Signed)
Pre visit review using our clinic review tool, if applicable. No additional management support is needed unless otherwise documented below in the visit note. 

## 2015-08-30 ENCOUNTER — Encounter: Payer: BLUE CROSS/BLUE SHIELD | Admitting: Internal Medicine

## 2015-09-10 ENCOUNTER — Encounter: Payer: Self-pay | Admitting: Internal Medicine

## 2015-09-10 ENCOUNTER — Ambulatory Visit (INDEPENDENT_AMBULATORY_CARE_PROVIDER_SITE_OTHER): Payer: BLUE CROSS/BLUE SHIELD | Admitting: Internal Medicine

## 2015-09-10 VITALS — BP 158/80 | HR 38 | Ht 69.0 in | Wt 135.4 lb

## 2015-09-10 DIAGNOSIS — I10 Essential (primary) hypertension: Secondary | ICD-10-CM

## 2015-09-10 DIAGNOSIS — I495 Sick sinus syndrome: Secondary | ICD-10-CM

## 2015-09-10 NOTE — Progress Notes (Signed)
Patient Care Team: Colon Branch, MD as PCP - General (Internal Medicine) Deboraha Sprang, MD as Consulting Physician (Cardiology) Bo Merino, MD as Consulting Physician (Rheumatology) Senaida Ores, MD as Referring Physician (Obstetrics and Gynecology)   HPI  Brittany Hodges is a 49 y.o. female is seen in followup for junctional rhythm in the setting of atrial septal defect repair. In the past she has had assessments of chrontropic competence which have been adequate.  She does have mild limitations but has problems with exercise. No LH or syncope She continues to have episodic chest discomfort. These are unrelated to exertion. She is exercising 3-4 days per week at the gym  She underwent myoview scanning in the fall 2011 demonstrating mild ischemia v attenusation with EF 52% and min LV dilitation.     Past Medical History  Diagnosis Date  . Allergic rhinitis, cause unspecified   . Asthma   . Chest pain, unspecified     cardiac cath 07/31/10: normal coronaries; vigorous LVF  . Atrial septal defect     s/p repair;   Echo 08/07/10: EF 55-60%; mild MR; atrial septal aneurysm; no residual ASD  . Sinoatrial node dysfunction (HCC)     eval. for chronotropic competence completed in past  . Hypothyroidism   . Esophageal reflux   . Irritable bowel syndrome   . Hematuria, unspecified   . Rheumatoid arthritis(714.0)     Dr Herold Harms  . Fibromyalgia   . Anemia   . Hypertension     no meds   . Low back pain   . Headache(784.0)   . Scoliosis     Past Surgical History  Procedure Laterality Date  . Tubal ligation    . Asd repair  [id number]    Current Outpatient Prescriptions  Medication Sig Dispense Refill  . Adalimumab (HUMIRA) 40 MG/0.8ML PSKT Inject into the skin as needed (every other week).    Marland Kitchen albuterol (PROAIR HFA) 108 (90 BASE) MCG/ACT inhaler Inhale 2 puffs into the lungs every 6 (six) hours as needed. For shortness of breath and wheezing 1 Inhaler 1  . amLODipine  (NORVASC) 5 MG tablet Take 1 tablet (5 mg total) by mouth daily. 90 tablet 3  . amoxicillin (AMOXIL) 500 MG capsule TAKE 4 CAPSULE BY MOUTH 1 HOUR BEFORE DENTAL PROCEDURES.    Marland Kitchen azelastine (ASTELIN) 0.1 % nasal spray Place 2 sprays into both nostrils at bedtime as needed for rhinitis. Use in each nostril as directed 30 mL 3  . cetirizine (ZYRTEC) 10 MG tablet TAKE ONE TABLET BY MOUTH EVERY DAY 30 tablet 11  . Cholecalciferol (VITAMIN D) 1000 UNITS capsule Take 1,000 Units by mouth daily.      Marland Kitchen levothyroxine (SYNTHROID, LEVOTHROID) 125 MCG tablet Take 1 tablet (125 mcg total) by mouth daily before breakfast. 90 tablet 1  . omeprazole (PRILOSEC) 40 MG capsule Take 1 capsule (40 mg total) by mouth 2 (two) times daily. 180 capsule 3  . polyethylene glycol powder (GLYCOLAX/MIRALAX) powder every other day.     . sodium chloride (V-R NASAL SPRAY SALINE) 0.65 % nasal spray Place into the nose. As directed    . Spacer/Aero-Holding Chambers (AEROCHAMBER MV) inhaler Use as instructed 1 each 0  . tiZANidine (ZANAFLEX) 4 MG tablet Take 4 mg by mouth as directed.     No current facility-administered medications for this visit.    Allergies  Allergen Reactions  . Etanercept   . Methotrexate Derivatives  Caused her WBC to drop  . Pregabalin     Review of Systems negative except from HPI and PMH  Physical Exam BP 158/80 mmHg  Pulse 38  Ht 5\' 9"  (1.753 m)  Wt 135 lb 6.4 oz (61.417 kg)  BMI 19.99 kg/m2  Well developed and nourished in no acute distress HENT normal Neck supple with JVP-flat Clear slow but Regular rate and rhythm, 2/6 systolic m Abd-soft with active BS No Clubbing cyanosis edema Skin-warm and dry A & Oriented  Grossly normal sensory and motor function  ECG demonstrates sinus rhythm 38  Intervals 15/09/45   Assessment and  Plan  Sinus node dysfunction   Atrial septal defect repair  Hypertension  Chest pain   Stable with sinus bradycardia.  In the event that  she develops symptoms of lightheadedness or exercise intolerance, it would be appropriate to pursue pacing. At this point it seems largely asymptomatic.

## 2015-09-10 NOTE — Patient Instructions (Signed)

## 2015-09-23 ENCOUNTER — Telehealth: Payer: Self-pay | Admitting: Internal Medicine

## 2015-09-23 NOTE — Telephone Encounter (Signed)
New message      Five-7 minutes ago pt got a pain between her stomach and rib cage and it "shot" down to her right knee.  The pain lasted for a few seconds.  She has a low heart rate and it concerned her.  She want to talk to  nurse

## 2015-09-23 NOTE — Telephone Encounter (Signed)
I left a message for the patient to call. 

## 2015-09-23 NOTE — Telephone Encounter (Signed)
I spoke with the patient. She reports that she started having pain in her stomach/ ribs this morning that went down the back of her legs. She then started to have some chest "aching." She has a diagnosis of sinus node dysfunction and her HR was 38 when she was last in to see Dr. Caryl Comes on 09/10/15. She thinks her HR's may have been a little lower than usual for her, but she denies other symptoms of syncope/ pre-syncope/ dizziness/ lightheadedness/ SOB. I have advised her to continue to monitor her HR's. She thinks her BP has been ok, but will monitor this as well. I have advised her I am uncertain as to what her symptoms of stomach pain radiating to the backs of her legs and now chest "aching" are related to. She will continue to monitor her symptoms and call back if needed. She is agreeable.

## 2015-11-22 ENCOUNTER — Encounter: Payer: BLUE CROSS/BLUE SHIELD | Admitting: Internal Medicine

## 2016-01-27 ENCOUNTER — Ambulatory Visit (INDEPENDENT_AMBULATORY_CARE_PROVIDER_SITE_OTHER): Payer: BLUE CROSS/BLUE SHIELD | Admitting: Internal Medicine

## 2016-01-27 ENCOUNTER — Encounter: Payer: Self-pay | Admitting: Internal Medicine

## 2016-01-27 VITALS — BP 118/74 | HR 39 | Temp 98.0°F | Resp 14 | Ht 69.0 in | Wt 134.1 lb

## 2016-01-27 DIAGNOSIS — E039 Hypothyroidism, unspecified: Secondary | ICD-10-CM

## 2016-01-27 DIAGNOSIS — I10 Essential (primary) hypertension: Secondary | ICD-10-CM | POA: Diagnosis not present

## 2016-01-27 DIAGNOSIS — Z23 Encounter for immunization: Secondary | ICD-10-CM | POA: Diagnosis not present

## 2016-01-27 NOTE — Progress Notes (Signed)
Subjective:    Patient ID: Brittany Hodges, female    DOB: Oct 04, 1965, 50 y.o.   MRN: [id number]  DOS:  01/27/2016 Type of visit - description : Routine checkup Interval history:  Good medication compliance, note from cardiology reviewed , she is stable.  She sees rheumatology regularly and gets blood work there every 3 months. She has chronic hyperpigmentation of her toenails, somewhat concerned about it  Review of Systems Denies chest pain or difficulty breathing No nausea, vomiting, diarrhea No cough.  Past Medical History:  Diagnosis Date  . Allergic rhinitis, cause unspecified   . Anemia   . Asthma   . Atrial septal defect    s/p repair;   Echo 08/07/10: EF 55-60%; mild MR; atrial septal aneurysm; no residual ASD  . Chest pain, unspecified    cardiac cath 07/31/10: normal coronaries; vigorous LVF  . Esophageal reflux   . Fibromyalgia   . Headache(784.0)   . Hematuria, unspecified   . Hypertension    no meds   . Hypothyroidism   . Irritable bowel syndrome   . Low back pain   . Rheumatoid arthritis(714.0)    Dr Herold Harms  . Scoliosis   . Sinoatrial node dysfunction (HCC)    eval. for chronotropic competence completed in past    Past Surgical History:  Procedure Laterality Date  . ASD REPAIR  1985  . TUBAL LIGATION      Social History   Social History  . Marital status: Single    Spouse name: N/A  . Number of children: 3  . Years of education: N/A   Occupational History  . Curator   Social History Main Topics  . Smoking status: Never Smoker  . Smokeless tobacco: Never Used     Comment: never used tobacco  . Alcohol use No  . Drug use: No  . Sexual activity: Not Currently   Other Topics Concern  . Not on file   Social History Narrative   Household-- pt and youngest son         Medication List       Accurate as of 01/27/16 11:59 PM. Always use your most recent med list.          AEROCHAMBER MV  inhaler Use as instructed   albuterol 108 (90 Base) MCG/ACT inhaler Commonly known as:  PROAIR HFA Inhale 2 puffs into the lungs every 6 (six) hours as needed. For shortness of breath and wheezing   amLODipine 5 MG tablet Commonly known as:  NORVASC Take 1 tablet (5 mg total) by mouth daily.   amoxicillin 500 MG capsule Commonly known as:  AMOXIL TAKE 4 CAPSULE BY MOUTH 1 HOUR BEFORE DENTAL PROCEDURES.   azelastine 0.1 % nasal spray Commonly known as:  ASTELIN Place 2 sprays into both nostrils at bedtime as needed for rhinitis. Use in each nostril as directed   cetirizine 10 MG tablet Commonly known as:  ZYRTEC TAKE ONE TABLET BY MOUTH EVERY DAY   HUMIRA 40 MG/0.8ML Pskt Generic drug:  Adalimumab Inject into the skin as needed (every other week).   levothyroxine 125 MCG tablet Commonly known as:  SYNTHROID, LEVOTHROID Take 1 tablet (125 mcg total) by mouth daily before breakfast.   omeprazole 40 MG capsule Commonly known as:  PRILOSEC Take 1 capsule (40 mg total) by mouth 2 (two) times daily.   polyethylene glycol powder powder Commonly known as:  GLYCOLAX/MIRALAX every other day.   tiZANidine 4 MG tablet  Commonly known as:  ZANAFLEX Take 4 mg by mouth as directed.   V-R NASAL SPRAY SALINE 0.65 % nasal spray Generic drug:  sodium chloride Place into the nose. As directed   Vitamin D 1000 units capsule Take 1,000 Units by mouth daily.          Objective:   Physical Exam BP 118/74 (BP Location: Left Arm, Patient Position: Sitting, Cuff Size: Normal)   Pulse (!) 39   Temp 98 F (36.7 C) (Oral)   Resp 14   Ht 5\' 9"  (1.753 m)   Wt 134 lb 2 oz (60.8 kg)   SpO2 99%   BMI 19.81 kg/m  General:   Well developed, well nourished . NAD.  HEENT:  Normocephalic . Face symmetric, atraumatic Lungs:  CTA B Normal respiratory effort, no intercostal retractions, no accessory muscle use. Heart: RRR,  no murmur.  No pretibial edema bilaterally  Skin: Not pale. Not  jaundice. Most of her toenails are indeed slightly hyperpigmented without other changes. Skin of her feet look healthy Neurologic:  alert & oriented X3.  Speech normal, gait appropriate for age and unassisted Psych--  Cognition and judgment appear intact.  Cooperative with normal attention span and concentration.  Behavior appropriate. No anxious or depressed appearing.      Assessment & Plan:    Assessment > HTN Hypothyroidism GERD- dysphagia -Dr. Norville Haggard EGD from 04/02/2014 (Dr Delrae Alfred): Narrowing of proximal esophagus, question of extrinsic extrinsic compression, EUS showing no intrinsic esophageal mass but the spine was seen in close proximity to the esophagus probably resulting in extrinsic compression. The endoscopist recommend repeat CT neck and chest in 3 months.saw ENT 04-2015 for dysphagia,had a scope,  rx PPI Asthma, uses alb rarely  CV: --Atrial septal defect: Status post repair, no residual ASD --Sinoatrial  Dysfunction, sees Dr Caryl Comes   --CP: cath 2012, normal coronaries MSK: --Rheumatoid arthritis, HUMIRA per  Dr. Herold Harms --Fibromyalgia IBS (Miralax prn constipation)    PLAN: HTN: Continue amlodipine, seems well-controlled Hypothyroidism: Continue Synthroid 125 mcg. Check a TSH Sinoatrial  dysfunction, with bradycardia, felt to be a stable by cardiology  08/2015 Primary care: Flu shot, Prevnar. Get mammograms and Pap smears at gyn. Toenail hyperpigmentation: Likely benign, recommend observation RTC 6 months

## 2016-01-27 NOTE — Progress Notes (Signed)
Pre visit review using our clinic review tool, if applicable. No additional management support is needed unless otherwise documented below in the visit note. 

## 2016-01-27 NOTE — Patient Instructions (Signed)
GO TO THE LAB : Get the blood work     GO TO THE FRONT DESK Schedule your next appointment for a  physical exam in 6 months  

## 2016-01-28 LAB — TSH: TSH: 0.06 u[IU]/mL — ABNORMAL LOW (ref 0.35–4.50)

## 2016-01-28 NOTE — Assessment & Plan Note (Signed)
HTN: Continue amlodipine, seems well-controlled Hypothyroidism: Continue Synthroid 125 mcg. Check a TSH Sinoatrial  dysfunction, with bradycardia, felt to be a stable by cardiology  08/2015 Primary care: Flu shot, Prevnar. Get mammograms and Pap smears at gyn. Toenail hyperpigmentation: Likely benign, recommend observation RTC 6 months

## 2016-01-30 MED ORDER — LEVOTHYROXINE SODIUM 100 MCG PO TABS: 100.0000 ug | ORAL_TABLET | Freq: Every day | ORAL | 1 refills | Status: DC

## 2016-01-30 NOTE — Addendum Note (Signed)
Addended byDamita Dunnings D on: 01/30/2016 01:35 PM   Modules accepted: Orders

## 2016-02-25 ENCOUNTER — Other Ambulatory Visit: Payer: Self-pay | Admitting: Internal Medicine

## 2016-03-25 ENCOUNTER — Telehealth: Payer: Self-pay | Admitting: Internal Medicine

## 2016-03-25 NOTE — Telephone Encounter (Signed)
Caller name: Relationship to patient: Self Can be reached: [phone]  Pharmacy:  Reason for call: Patient request call back to ask question.

## 2016-03-25 NOTE — Telephone Encounter (Signed)
Spoke w/ Pt, she is having more frequent issues with swallowing foods and wanted to know if she should let her GI provider know or if she should see ENT about her thyroid. Recommended she call GI provider first and if they recommend she see ENT to let us know and a referral can be placed. Pt verbalized understanding.

## 2016-03-26 ENCOUNTER — Telehealth: Payer: Self-pay | Admitting: Pulmonary Disease

## 2016-03-26 NOTE — Telephone Encounter (Signed)
We did not address the issue at the last visit, if she feels she can't wait till the next visit, please schedule an appointment with me.

## 2016-03-26 NOTE — Telephone Encounter (Signed)
Please advise 

## 2016-03-26 NOTE — Telephone Encounter (Signed)
GI recommended patient to follow up with PCP or Pulmonary, please advise best work # [phone]

## 2016-03-26 NOTE — Telephone Encounter (Signed)
Pt has appt w/ pulmonary on 04/06/2016.

## 2016-03-26 NOTE — Telephone Encounter (Signed)
Called and spoke to pt. Pt requesting to see SN for a pulmonary issue, chronic cough. Pt scheduled to see SN on 11.20.17. Pt verbalized understanding and denied any further questions or concerns at this time.

## 2016-03-29 ENCOUNTER — Telehealth: Payer: Self-pay | Admitting: Internal Medicine

## 2016-03-29 NOTE — Telephone Encounter (Signed)
Due for a TSH, please arrange 

## 2016-03-30 NOTE — Telephone Encounter (Signed)
Letter printed and mailed to Pt. TSH ordered.  

## 2016-04-06 ENCOUNTER — Ambulatory Visit (INDEPENDENT_AMBULATORY_CARE_PROVIDER_SITE_OTHER): Payer: BLUE CROSS/BLUE SHIELD | Admitting: Pulmonary Disease

## 2016-04-06 ENCOUNTER — Encounter: Payer: Self-pay | Admitting: Pulmonary Disease

## 2016-04-06 ENCOUNTER — Ambulatory Visit (INDEPENDENT_AMBULATORY_CARE_PROVIDER_SITE_OTHER)
Admission: RE | Admit: 2016-04-06 | Discharge: 2016-04-06 | Disposition: A | Discharge status: Home / Self Care | Payer: BLUE CROSS/BLUE SHIELD | Source: Ambulatory Visit | Attending: Pulmonary Disease | Admitting: Pulmonary Disease

## 2016-04-06 VITALS — BP 120/82 | HR 49 | Temp 97.9°F | Ht 68.0 in | Wt 139.4 lb

## 2016-04-06 DIAGNOSIS — K219 Gastro-esophageal reflux disease without esophagitis: Secondary | ICD-10-CM | POA: Diagnosis not present

## 2016-04-06 DIAGNOSIS — R198 Other specified symptoms and signs involving the digestive system and abdomen: Secondary | ICD-10-CM

## 2016-04-06 DIAGNOSIS — J45909 Unspecified asthma, uncomplicated: Secondary | ICD-10-CM | POA: Diagnosis not present

## 2016-04-06 DIAGNOSIS — J4520 Mild intermittent asthma, uncomplicated: Secondary | ICD-10-CM

## 2016-04-06 DIAGNOSIS — F458 Other somatoform disorders: Secondary | ICD-10-CM | POA: Diagnosis not present

## 2016-04-06 DIAGNOSIS — F411 Generalized anxiety disorder: Secondary | ICD-10-CM

## 2016-04-06 DIAGNOSIS — R0989 Other specified symptoms and signs involving the circulatory and respiratory systems: Secondary | ICD-10-CM | POA: Insufficient documentation

## 2016-04-06 MED ORDER — ALBUTEROL SULFATE HFA 108 (90 BASE) MCG/ACT IN AERS: 2.0000 | INHALATION_SPRAY | Freq: Four times a day (QID) | RESPIRATORY_TRACT | 6 refills | Status: DC | PRN

## 2016-04-06 MED ORDER — CLONAZEPAM 0.5 MG PO TABS
0.5000 mg | ORAL_TABLET | Freq: Two times a day (BID) | ORAL | 5 refills | Status: DC | PRN
Start: 2016-04-06 — End: 2016-07-24

## 2016-04-06 NOTE — Patient Instructions (Signed)
Brittany Hodges-- it was good seeing you again...  We discussed your throat symptms and how they relate to your asthma and reflux problem...    We checked a spirometry breathing test and a follow up CXR...       We will contact you w/ the results when available...   For the REFLUX>> this is very important treatment--    1) take the OMEPRAZOLE 40mg  one tab twice daily- 30 min before the 1st & last meals of the day...    2) do not eat or drink anything after dinner in the eve...    3) Elevate the head of your bed 6" as we demonstrated for you!  For the spasm in the throat muscles- affecting your voice/ swallowing/ etc...    Start the new KLONOPIN 0.5mg  one tab twice daily.Marland Kitchen.    (if it is too sedating you can cut this to 1/2 tab)  Call for any questions...  Let's plan a follow up visit in 6wks, sooner if needed for problems.Marland KitchenMarland Kitchen

## 2016-04-06 NOTE — Progress Notes (Addendum)
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 DAY   CHOLECALCIFEROL (VITAMIN D) 1000 UNITS CAPSULE    Take 1,000 Units by mouth daily.     LEVOTHYROXINE (SYNTHROID, LEVOTHROID) 125 MCG TABLET    TAKE 1 TABLET DAILY BEFORE BREAKFAST   OMEPRAZOLE (PRILOSEC) 40 MG CAPSULE    Take 1 capsule (40 mg total) by mouth 2 (two) times daily.   POLYETHYLENE GLYCOL POWDER (GLYCOLAX/MIRALAX) POWDER    every other day.    SODIUM CHLORIDE (V-R NASAL SPRAY SALINE) 0.65 % NASAL SPRAY    Place into the nose. As directed   SPACER/AERO-HOLDING CHAMBERS (AEROCHAMBER MV) INHALER    Use as instructed   TIZANIDINE (ZANAFLEX) 4 MG TABLET    Take 4 mg by mouth as directed.  Modified Medications   Modified Medication Previous Medication   ALBUTEROL (PROAIR HFA) 108 (90 BASE) MCG/ACT INHALER albuterol (PROAIR HFA) 108 (90 BASE) MCG/ACT inhaler      Inhale 2 puffs into the lungs every 6 (six) hours as needed. For shortness of breath and wheezing    Inhale 2 puffs into the lungs every 6 (six) hours as needed. For shortness of breath and wheezing  Discontinued Medications   AMOXICILLIN (AMOXIL) 500 MG CAPSULE    TAKE 4 CAPSULE BY MOUTH 1 HOUR BEFORE DENTAL PROCEDURES.   LEVOTHYROXINE (SYNTHROID, LEVOTHROID) 100 MCG TABLET    Take 1 tablet (100 mcg total) by mouth daily before breakfast.

## 2016-04-14 ENCOUNTER — Telehealth: Payer: Self-pay | Admitting: Rheumatology

## 2016-04-14 NOTE — Telephone Encounter (Signed)
Patient is going for lab work tomorrow and would like the orders released for [facility]. Thank you

## 2016-04-15 ENCOUNTER — Other Ambulatory Visit: Payer: Self-pay | Admitting: Radiology

## 2016-04-15 ENCOUNTER — Other Ambulatory Visit: Payer: Self-pay | Admitting: Rheumatology

## 2016-04-15 DIAGNOSIS — Z79899 Other long term (current) drug therapy: Secondary | ICD-10-CM

## 2016-04-15 NOTE — Telephone Encounter (Signed)
Released, thank you!

## 2016-04-18 LAB — COMPREHENSIVE METABOLIC PANEL
ALT: 16 IU/L (ref 0–32)
AST: 26 IU/L (ref 0–40)
Albumin/Globulin Ratio: 1 — ABNORMAL LOW (ref 1.2–2.2)
Albumin: 4.3 g/dL (ref 3.5–5.5)
Alkaline Phosphatase: 62 IU/L (ref 39–117)
BUN/Creatinine Ratio: 14 (ref 9–23)
BUN: 13 mg/dL (ref 6–24)
Bilirubin Total: 0.2 mg/dL (ref 0.0–1.2)
CO2: 22 mmol/L (ref 18–29)
Calcium: 9.8 mg/dL (ref 8.7–10.2)
Chloride: 102 mmol/L (ref 96–106)
Creatinine, Ser: 0.9 mg/dL (ref 0.57–1.00)
GFR calc Af Amer: 85 mL/min/{1.73_m2} (ref >59)
GFR calc non Af Amer: 74 mL/min/{1.73_m2} (ref >59)
Globulin, Total: 4.1 g/dL (ref 1.5–4.5)
Glucose: 101 mg/dL — ABNORMAL HIGH (ref 65–99)
Potassium: 4.3 mmol/L (ref 3.5–5.2)
Sodium: 140 mmol/L (ref 134–144)
Total Protein: 8.4 g/dL (ref 6.0–8.5)

## 2016-04-18 LAB — CBC WITH DIFFERENTIAL/PLATELET
Basophils Absolute: 0 10*3/uL (ref 0.0–0.2)
Basos: 0 %
EOS (ABSOLUTE): 0.1 10*3/uL (ref 0.0–0.4)
Eos: 2 %
Hematocrit: 38.3 % (ref 34.0–46.6)
Hemoglobin: 12.4 g/dL (ref 11.1–15.9)
Immature Grans (Abs): 0 10*3/uL (ref 0.0–0.1)
Immature Granulocytes: 0 %
Lymphocytes Absolute: 1.6 10*3/uL (ref 0.7–3.1)
Lymphs: 42 %
MCH: 28.1 pg (ref 26.6–33.0)
MCHC: 32.4 g/dL (ref 31.5–35.7)
MCV: 87 fL (ref 79–97)
Monocytes Absolute: 0.6 10*3/uL (ref 0.1–0.9)
Monocytes: 16 %
Neutrophils Absolute: 1.6 10*3/uL (ref 1.4–7.0)
Neutrophils: 40 %
Platelets: 189 10*3/uL (ref 150–379)
RBC: 4.41 x10E6/uL (ref 3.77–5.28)
RDW: 14 % (ref 12.3–15.4)
WBC: 3.9 10*3/uL (ref 3.4–10.8)

## 2016-04-20 NOTE — Progress Notes (Signed)
Labs are stable. No change in therapy.

## 2016-05-05 ENCOUNTER — Telehealth: Payer: Self-pay | Admitting: Internal Medicine

## 2016-05-05 NOTE — Telephone Encounter (Signed)
Brittany Hodges is calling in reference to her heart rate . She is currently in the hospital ( she is in [facility] ) and her heart rate is staying between 38-and the low 40's . Please call

## 2016-05-06 NOTE — Telephone Encounter (Signed)
I called and spoke with the patient- she is aware that Dr. Caryl Comes has reviewed the information I forwarded to him. Per the patient, she has not seen cardiology today- they do have her scheduled for an echo. I advised if the hospital provider needs to speak with Dr. Caryl Comes, they may call the office here to be put in contact with him. Otherwise, I have advised her to call for post hospital follow up after her discharge. She will most likely see a PA/ NP. She voices understanding.

## 2016-05-06 NOTE — Telephone Encounter (Signed)
Nothing we can do at this time but will be glad to talk to doctor if would be helpful We should plan to see her when she is released

## 2016-05-06 NOTE — Telephone Encounter (Signed)
I called and spoke with the patient. She states that she is still in [facility]. She reports that Monday night about 8:30 pm- she was in the kitchen talking with her daughter and reported feeling tired (more of a sudden onset). He son and daughter tried to get her go lay down, she states that she remembered telling them she was having trouble seeing.  She reports that she "blacked out" after that- unsure if she passed out completely. Her son caught her so she did not fall. Per his report, it took her about 45 seconds to come around. She reports that her HR's have been in the upper 30-40 range.  She will get up to the 50's she thinks with activity. She reports her TSH was high also. The hospitalist has currently been following her, but they are going to speak with cardiology today. I advised I will forward to Dr. Caryl Comes to review.

## 2016-05-12 ENCOUNTER — Ambulatory Visit: Payer: Self-pay | Admitting: Rheumatology

## 2016-05-13 ENCOUNTER — Telehealth: Payer: Self-pay | Admitting: Internal Medicine

## 2016-05-13 ENCOUNTER — Other Ambulatory Visit: Payer: Self-pay

## 2016-05-13 ENCOUNTER — Ambulatory Visit (INDEPENDENT_AMBULATORY_CARE_PROVIDER_SITE_OTHER): Payer: BLUE CROSS/BLUE SHIELD

## 2016-05-13 ENCOUNTER — Encounter: Payer: Self-pay | Admitting: Physician Assistant

## 2016-05-13 ENCOUNTER — Ambulatory Visit (INDEPENDENT_AMBULATORY_CARE_PROVIDER_SITE_OTHER): Payer: BLUE CROSS/BLUE SHIELD | Admitting: Physician Assistant

## 2016-05-13 ENCOUNTER — Encounter: Payer: Self-pay | Admitting: Internal Medicine

## 2016-05-13 ENCOUNTER — Telehealth: Payer: Self-pay

## 2016-05-13 ENCOUNTER — Ambulatory Visit (INDEPENDENT_AMBULATORY_CARE_PROVIDER_SITE_OTHER): Payer: BLUE CROSS/BLUE SHIELD | Admitting: Internal Medicine

## 2016-05-13 VITALS — BP 126/72 | HR 48 | Temp 98.1°F | Resp 14 | Ht 68.0 in | Wt 134.0 lb

## 2016-05-13 VITALS — BP 170/84 | HR 44 | Ht 68.0 in | Wt 134.0 lb

## 2016-05-13 DIAGNOSIS — I10 Essential (primary) hypertension: Secondary | ICD-10-CM | POA: Diagnosis not present

## 2016-05-13 DIAGNOSIS — R001 Bradycardia, unspecified: Secondary | ICD-10-CM

## 2016-05-13 DIAGNOSIS — R55 Syncope and collapse: Secondary | ICD-10-CM | POA: Diagnosis not present

## 2016-05-13 DIAGNOSIS — E039 Hypothyroidism, unspecified: Secondary | ICD-10-CM

## 2016-05-13 MED ORDER — LEVOTHYROXINE SODIUM 100 MCG PO TABS: 100.0000 ug | ORAL_TABLET | Freq: Every day | ORAL | 1 refills | Status: DC

## 2016-05-13 NOTE — Telephone Encounter (Signed)
FMLA paperwork received from Pt during hosp f/u today. Forms forwarded to PCP for completion.

## 2016-05-13 NOTE — Telephone Encounter (Signed)
Pt dropped off document to be filled out by PCP at front desk. (Medical Leave Absence Application) Pt states was seen today 05-13-16 and left some paperwork for pcp to fill out for one job but these document that pt just left in the afternoon is for her other job. Document given to [facility].

## 2016-05-13 NOTE — Patient Instructions (Signed)
Take synthroid 100 mcg 1 a day  Come back in 6 weeks for labs only for a thyroid check  Next visit with me in 3 months

## 2016-05-13 NOTE — Telephone Encounter (Signed)
Spoke w/ Pt, informed paperwork completed. Copies sent for scanning.

## 2016-05-13 NOTE — Progress Notes (Signed)
Pre visit review using our clinic review tool, if applicable. No additional management support is needed unless otherwise documented below in the visit note. 

## 2016-05-13 NOTE — Patient Instructions (Addendum)
Medication Instructions:   Your physician recommends that you continue on your current medications as directed. Please refer to the Current Medication list given to you today.   If you need a refill on your cardiac medications before your next appointment, please call your pharmacy.  Labwork:  NONE ORDERED  TODAY    Testing/Procedures: Your physician has recommended that you wear an event monitor. Event monitors are medical devices that record the heart's electrical activity. Doctors most often [place] these monitors to diagnose arrhythmias. Arrhythmias are problems with the speed or rhythm of the heartbeat. The monitor is a small, portable device. You can wear one while you do your normal daily activities. This is usually used to diagnose what is causing palpitations/syncope (passing out).     Follow-Up: IN ONE  MONTH WITH URSUY OR KLEIN    Any Other Special Instructions Will Be Listed Below (If Applicable).

## 2016-05-13 NOTE — Progress Notes (Signed)
Subjective:    Patient ID: Brittany Hodges, female    DOB: 1965/09/28, 50 y.o.   MRN: [id number]  DOS:  05/13/2016 Type of visit - description : Hospital follow-up, no TCM Interval history: Admitted to the outside hospital 05/05/2016: Initial diagnosis was seizure versus pseudoseizure. Was  Seen by neuro. MRI EEG were negative. She was found to be bradycardic, heart rate as low as in the 30s. Was seen by cardiology. She was felt to be at baseline, they felt her symptoms were not cardiac related according to the consult note however they suggested to consider an implanted loop monitor as an outpatient.. Labs: Echocardiogram: EF 55-60% essentially negative TSH 0.02 BMP: Had hypokalemia, resolved. LFTs normal. Urine toxicology negative. Troponin 1 negative, CBC normal.    Review of Systems  Since she left the hospital, she is still not back to baseline. Still feeling lightheaded if she walks or when she stands up quickly . Denies chest pain or difficulty breathing No headaches, no loss of consciousness.  Past Medical History:  Diagnosis Date  . Allergic rhinitis, cause unspecified   . Anemia   . Asthma   . Atrial septal defect    s/p repair;   Echo 08/07/10: EF 55-60%; mild MR; atrial septal aneurysm; no residual ASD  . Chest pain, unspecified    cardiac cath 07/31/10: normal coronaries; vigorous LVF  . Esophageal reflux   . Fibromyalgia   . Headache(784.0)   . Hematuria, unspecified   . Hypertension    no meds   . Hypothyroidism   . Irritable bowel syndrome   . Low back pain   . Rheumatoid arthritis(714.0)    Dr Herold Harms  . Scoliosis   . Sinoatrial node dysfunction (HCC)    eval. for chronotropic competence completed in past    Past Surgical History:  Procedure Laterality Date  . ASD REPAIR  1985  . TUBAL LIGATION      Social History   Social History  . Marital status: Single    Spouse name: N/A  . Number of children: 3  . Years of education: N/A    Occupational History  . Curator   Social History Main Topics  . Smoking status: Never Smoker  . Smokeless tobacco: Never Used     Comment: never used tobacco  . Alcohol use No  . Drug use: No  . Sexual activity: Not Currently   Other Topics Concern  . Not on file   Social History Narrative   Household-- pt and youngest son       Allergies as of 05/13/2016      Reactions   Etanercept    Methotrexate Derivatives    Caused her WBC to drop   Pregabalin       Medication List       Accurate as of 05/13/16 11:59 PM. Always use your most recent med list.          AEROCHAMBER MV inhaler Use as instructed   albuterol 108 (90 Base) MCG/ACT inhaler Commonly known as:  PROAIR HFA Inhale 2 puffs into the lungs every 6 (six) hours as needed. For shortness of breath and wheezing   amLODipine 5 MG tablet Commonly known as:  NORVASC TAKE 1 TABLET DAILY   azelastine 0.1 % nasal spray Commonly known as:  ASTELIN Place 2 sprays into both nostrils at bedtime as needed for rhinitis. Use in each nostril as directed   cetirizine 10 MG tablet Commonly known as:  ZYRTEC TAKE ONE TABLET BY MOUTH EVERY DAY   clonazePAM 0.5 MG tablet Commonly known as:  KLONOPIN Take 1 tablet (0.5 mg total) by mouth 2 (two) times daily as needed for anxiety.   fluticasone 50 MCG/ACT nasal spray Commonly known as:  FLONASE Place 2 sprays into both nostrils daily as needed.   HUMIRA 40 MG/0.8ML Pskt Generic drug:  Adalimumab Inject into the skin as needed (every other week).   levothyroxine 100 MCG tablet Commonly known as:  SYNTHROID, LEVOTHROID Take 1 tablet (100 mcg total) by mouth daily.   omeprazole 40 MG capsule Commonly known as:  PRILOSEC Take 1 capsule (40 mg total) by mouth 2 (two) times daily.   polyethylene glycol powder powder Commonly known as:  GLYCOLAX/MIRALAX every other day.   tiZANidine 4 MG tablet Commonly known as:   ZANAFLEX Take 4 mg by mouth as directed.   V-R NASAL SPRAY SALINE 0.65 % nasal spray Generic drug:  sodium chloride Place into the nose. As directed   Vitamin D 1000 units capsule Take 1,000 Units by mouth daily.          Objective:   Physical Exam BP 126/72 (BP Location: Left Arm, Patient Position: Sitting, Cuff Size: Small)   Pulse (!) 48   Temp 98.1 F (36.7 C) (Oral)   Resp 14   Ht 5\' 8"  (1.727 m)   Wt 134 lb (60.8 kg)   LMP 04/20/2016 (Exact Date)   SpO2 97%   BMI 20.37 kg/m  General:   Well developed, well nourished . NAD.  HEENT:  Normocephalic . Face symmetric, atraumatic Lungs:  CTA B Normal respiratory effort, no intercostal retractions, no accessory muscle use. Heart:brady ,  no murmur.  No pretibial edema bilaterally  Skin: Not pale. Not jaundice Neurologic:  alert & oriented X3.  Speech normal, gait appropriate for age and unassisted Psych--  Cognition and judgment appear intact.  Cooperative with normal attention span and concentration.  Behavior appropriate. No anxious or depressed appearing.      Assessment & Plan:   Assessment > HTN Hypothyroidism GERD- dysphagia -Dr. Norville Haggard EGD from 04/02/2014 (Dr Delrae Alfred): Narrowing of proximal esophagus, question of extrinsic extrinsic compression, EUS showing no intrinsic esophageal mass but the spine was seen in close proximity to the esophagus probably resulting in extrinsic compression. The endoscopist recommend repeat CT neck and chest in 3 months.saw ENT 04-2015 for dysphagia,had a scope,  rx PPI Asthma, uses alb rarely  CV: --Atrial septal defect: Status post repair, no residual ASD --Sinoatrial  Dysfunction, sees Dr Caryl Comes   --CP: cath 2012, normal coronaries MSK: --Rheumatoid arthritis, HUMIRA per  Dr. Herold Harms --Fibromyalgia IBS (Miralax prn constipation)    PLAN: Syncope: Recently admitted after she lost consciousness, was seen by neuro and cardiology in-house. She just saw our local  cardiologists, they're planning further eval. Hypothyroidism: Last TSH suppressed, she was taking Synthroid 125 mcg (none x 1 week). Recommend to decrease it to 100 g. 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 I will recommend not to work from December 19 to 3 weeks from today. Extensive paper work completed. RTC 3 months.

## 2016-05-13 NOTE — Telephone Encounter (Signed)
done

## 2016-05-13 NOTE — Progress Notes (Signed)
Cardiology Office Note Date:  05/13/2016  Patient ID:  Brittany, Hodges 1965/09/22, MRN [id number] PCP:  Kathlene November, MD  Cardiologist:  Dr. Caryl Comes   Chief Complaint: syncope  History of Present Illness: Brittany Hodges is a 50 y.o. female with history of sinus node dysfunction/chronoctropic incompetence, ASD repair, hypothyroidism, IBS, fibromyalgia, HTN, asthma, RA comes to the office today to be seen for Dr. Caryl Comes.  She last saw him in April at that time he describes her as largely asymptomatic from SN dysfunction, though should she develop symptoms, pursuing pacing would be reasonable.  She has hx of CP underwent myoview scanning in the fall 2011 demonstrating mild ischemia v attenusation with EF 52% and min LV dilatation, cardiac cath 07/31/10: normal coronaries; vigorous LVF.  She comes today after a hospital stay at [facility], discharged 05/07/16.  She reports standing in her kitchen talking with her son, feeling like something was wrong and fainted.  She reports being told she was told a few seconds, maybe about 45 seconds, her son caught her and did not fall to the grounds or suffer trauma.  She had no CP or sensation of palpitations, no SOB.  Since then in general feels generally weak and has felt like she gets lightheaded no near syncope or syncope.  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  [place] note: Discharge Diagnoses: Principal Problem: Seizure-like activity (RAF-HCC) Active Problems: Allergic rhinitis Essential hypertension (RAF-HCC) Gastroesophageal reflux disease (RAF-HCC) Hypothyroidism (RAF-HCC) Hypokalemia Orthostatic hypotension Sinus bradycardia Hyperthyroidism (RAF-HCC) Resolved Problems: * No resolved hospital problems. [facility] Course:  50 y.o. F w hx of hypothyroidism on Synthroid, history of ASD repair at the age of 19, who follows up at [place] [facility] who was brought to ER for eval from home.   at home she was standing and talking to her kid around 8-8.30 pm, then she suddenly "feel funny" with some blurred vision, as per her children see "blanked out" for a brief seconds . patient denied any preceding chest pain, palpitation, diaphoresis shortness of nausea vomiting or any other symptoms. No focal weakness. In the ER she was noted to be bradycardic, CT head without any acute finding, labs showed mild hypokalemia. She was admitted for evaluation. Differential included presyncopal episode/seizure episode vs from orthostasis as she was orthostatic in ER. She was seen by neurology. MRI and EEG the brain was unremarkable. Seen by cardiology given her bradycardia however her bradycardia has been worked up in the past his runs low at baseline. Discussed with Dr. Minna Merritts from [place] [facility] who doesn't feel bradycardia is related to her presentation and he has advised follow-up as outpatient, he stated he can plan on o/p loop recorder and monitor if pt chosese to do so from him or she may f/u with her own cardiology. He was hydrated she is orthostatic negative at this point. Her TSH was low and free t 4 high-I have held her Synthroid and advised her to follow-up with her PCP next week check her labs and resume as per primary care. Rest of medical comorbidities are stable and are chronic and she'll continue on her home medication.  05/07/16: TTE Summary Normal left ventricular size and systolic function with no appreciable segmental abnormality. Ejection fraction is visually estimated at 55-60% Structurally normal mitral valve with good mobility. Trace mitral regurgitation. Mild tricuspid regurgitation    She sees her PMD later today to revisit her thyroid replacement and management.  Past Medical History:  Diagnosis Date  . Allergic rhinitis, cause unspecified   . Anemia   . Asthma   . Atrial septal defect    s/p repair;   Echo 08/07/10: EF 55-60%; mild MR; atrial septal aneurysm; no  residual ASD  . Chest pain, unspecified    cardiac cath 07/31/10: normal coronaries; vigorous LVF  . Esophageal reflux   . Fibromyalgia   . Headache(784.0)   . Hematuria, unspecified   . Hypertension    no meds   . Hypothyroidism   . Irritable bowel syndrome   . Low back pain   . Rheumatoid arthritis(714.0)    Dr Herold Harms  . Scoliosis   . Sinoatrial node dysfunction (HCC)    eval. for chronotropic competence completed in past    Past Surgical History:  Procedure Laterality Date  . ASD REPAIR  1985  . TUBAL LIGATION      Current Outpatient Prescriptions  Medication Sig Dispense Refill  . Adalimumab (HUMIRA) 40 MG/0.8ML PSKT Inject into the skin as needed (every other week).    Marland Kitchen albuterol (PROAIR HFA) 108 (90 Base) MCG/ACT inhaler Inhale 2 puffs into the lungs every 6 (six) hours as needed. For shortness of breath and wheezing 1 Inhaler 6  . amLODipine (NORVASC) 5 MG tablet TAKE 1 TABLET DAILY 90 tablet 3  . azelastine (ASTELIN) 0.1 % nasal spray Place 2 sprays into both nostrils at bedtime as needed for rhinitis. Use in each nostril as directed 30 mL 3  . cetirizine (ZYRTEC) 10 MG tablet TAKE ONE TABLET BY MOUTH EVERY DAY 30 tablet 11  . Cholecalciferol (VITAMIN D) 1000 UNITS capsule Take 1,000 Units by mouth daily.      . clonazePAM (KLONOPIN) 0.5 MG tablet Take 1 tablet (0.5 mg total) by mouth 2 (two) times daily as needed for anxiety. 60 tablet 5  . fluticasone (FLONASE) 50 MCG/ACT nasal spray Place 2 sprays into both nostrils daily as needed.    Marland Kitchen omeprazole (PRILOSEC) 40 MG capsule Take 1 capsule (40 mg total) by mouth 2 (two) times daily. (Patient taking differently: Take 40 mg by mouth daily. ) 180 capsule 3  . polyethylene glycol powder (GLYCOLAX/MIRALAX) powder every other day.     . sodium chloride (V-R NASAL SPRAY SALINE) 0.65 % nasal spray Place into the nose. As directed    . Spacer/Aero-Holding Chambers (AEROCHAMBER MV) inhaler Use as instructed 1 each 0  .  tiZANidine (ZANAFLEX) 4 MG tablet Take 4 mg by mouth as directed.    Marland Kitchen levothyroxine (SYNTHROID, LEVOTHROID) 100 MCG tablet Take 1 tablet (100 mcg total) by mouth daily. 30 tablet 1   No current facility-administered medications for this visit.     Allergies:   Etanercept; Methotrexate derivatives; and Pregabalin   Social History:  The patient  reports that she has never smoked. She has never used smokeless tobacco. She reports that she does not drink alcohol or use drugs.   Family History:  The patient's family history includes Breast cancer in her other; Diabetes in her other; Heart disease in her maternal grandmother; Hypertension in her mother.  ROS:  Please see the history of present illness.   All other systems are reviewed and otherwise negative.   PHYSICAL EXAM: VS:  BP (!) 170/84   Pulse (!) 44   Ht 5\' 8"  (1.727 m)   Wt 134 lb (60.8 kg)   LMP 04/20/2016 (Exact Date)   BMI 20.37 kg/m  BMI: Body mass index is 20.37  kg/m.  A recheck manually of her BP isi 156/70 (seated) Well nourished, well developed, in no acute distress  HEENT: normocephalic, atraumatic  Neck: no JVD, carotid bruits or masses Cardiac: RRR; bradycardic, no significant murmurs, no rubs, or gallops Lungs:  clear to auscultation bilaterally, no wheezing, rhonchi or rales  Abd: soft, nontender MS: no deformity or atrophy Ext: no edema  Skin: warm and dry, no rash Neuro:  No gross deficits appreciated Psych: euthymic mood, full affect    EKG:  Done today shows SB, 40bpm 09/10/15 was SB 38bpm  03/20/14: TTE Study Conclusions - Left ventricle: The cavity size was normal. Wall thickness was normal. Systolic function was low normal to mildly reduced. The estimated ejection fraction was in the range of 50% to 55%. Wall motion was normal; there were no regional wall motion abnormalities. Features are consistent with a pseudonormal left ventricular filling pattern, with concomitant abnormal  relaxation and increased filling pressure (grade 2 diastolic dysfunction). - Aortic valve: There was trivial regurgitation. - Mitral valve: There was mild regurgitation. - Right ventricle: The cavity size was normal. Systolic function was normal. - Atrial septum: No residual ASD noted. - Tricuspid valve: There was mild-moderate regurgitation. Peak RV-RA gradient (S): 36 mm Hg. - Pulmonary arteries: PA peak pressure: 44 mm Hg (S). - Systemic veins: IVC measured 2.2 cm with > 50% respirophasic variation, suggesting RA pressure 8 mmHg. Impressions: - The patient was profoundly bradycardic during this study. Normal LV size with low normal to mildly decreased systolic function, EF 99991111. Moderate diastolic dysfunction. Normal RV size and systolic function. Mild to moderate TR. Mild pulmonary hypertension.  Recent Labs: 01/27/2016: TSH 0.06 04/15/2016: ALT 16; BUN 13; Creatinine, Ser 0.90; Platelets 189; Potassium 4.3; Sodium 140  05/24/2015: Cholesterol 141; HDL 56.20; LDL Cholesterol 74; Total CHOL/HDL Ratio 3; Triglycerides 56.0; VLDL 11.2   CrCl cannot be calculated (Patient's most recent lab result is older than the maximum 21 days allowed.).   Wt Readings from Last 3 Encounters:  05/13/16 134 lb (60.8 kg)  05/13/16 134 lb (60.8 kg)  04/06/16 139 lb 6 oz (63.2 kg)     Other studies reviewed: Additional studies/records reviewed today include: summarized above including [place] record  ASSESSMENT AND PLAN:  1. SN dysfunction  2. She had a syncopal event     [facility] note reviewed, evaluated by cardiology/EP during the stay, SB not felt to have contributed to her event     Record notes she was + for orthostatic changes that improved after hydration     She is counseled on keeping adequately hydrated     Her BP high initially, recheck is 156/70, no changes to her meds for now  She is made aware of [place] law no driving for 6 months after syncope, at the hospital  felt to be secondary to orthostasis, not her bradycardia She has some mild symptoms reported, there was discussion of a loop, will start with a 2 week holter monitor asked her to use her diary and symptom marker in effort to correlate (or not) any connection with her symptoms to her bradycardia.  She is cautioned and recommended to avoid ladders or similar activities.  She will see her PMD today regarding her thyroid.  2. Hx of ASD repair     Pt follows prophylaxis regime  3. HTN     No changes today   Disposition: will see her back in 3-4 weeks, sooner if needed.  I will discuss with  Dr. Caryl Comes.  Current medicines are reviewed at length with the patient today.  The patient did not have any concerns regarding medicines.  Haywood Lasso, PA-C 05/13/2016 4:43 PM     [address] [place] [id number] [phone] (office)  [phone] (fax)

## 2016-05-13 NOTE — Telephone Encounter (Signed)
Spoke w/ Pt, informed paperwork completed and ready for pick up. Copies of form sent for scanning.

## 2016-05-14 NOTE — Assessment & Plan Note (Signed)
Syncope: Recently admitted after she lost consciousness, was seen by neuro and cardiology in-house. She just saw our local cardiologists, they're planning further eval. Hypothyroidism: Last TSH suppressed, she was taking Synthroid 125 mcg (none x 1 week). Recommend to decrease it to 100 g. 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 I will recommend not to work from December 19 to 3 weeks from today. Extensive paper work completed. RTC 3 months.

## 2016-05-26 ENCOUNTER — Encounter: Payer: BLUE CROSS/BLUE SHIELD | Admitting: Internal Medicine

## 2016-05-26 ENCOUNTER — Ambulatory Visit: Payer: BLUE CROSS/BLUE SHIELD | Admitting: Pulmonary Disease

## 2016-05-27 ENCOUNTER — Telehealth: Payer: Self-pay | Admitting: Internal Medicine

## 2016-05-27 NOTE — Telephone Encounter (Signed)
Form completed and faxed to [phone]. Form sent for scanning. Faxed confirmation received. Work note printed, signed and placed at front desk for pick up. Pt informed of above and verbalized understanding.

## 2016-05-27 NOTE — Telephone Encounter (Signed)
Relation to pt: self  Call back number:[phone]   Reason for call:  Patient checking on the status of "physican summary" form, "[employer]" will be re faxing over form to [phone] Attention Kaylyn.    Patient requesting return to work letter for 06/04/16 and would like to pick up note tomorrow 05/28/16, please advise.

## 2016-06-03 ENCOUNTER — Ambulatory Visit: Payer: BLUE CROSS/BLUE SHIELD | Admitting: Pulmonary Disease

## 2016-06-14 NOTE — Progress Notes (Deleted)
Cardiology Office Note Date:  06/14/2016  Patient ID:  Brittany, Hodges 1965/05/22, MRN [id number] PCP:  Kathlene November, MD  Cardiologist:  Dr. Caryl Comes   Chief Complaint: syncope  History of Present Illness: Brittany Hodges is a 51 y.o. female with history of sinus node dysfunction/chronoctropic incompetence, ASD repair, hypothyroidism, IBS, fibromyalgia, HTN, asthma, RA comes to the office today to be seen for Dr. Caryl Comes.  She last saw him in April at that time he describes her as largely asymptomatic from SN dysfunction, though should she develop symptoms, pursuing pacing would be reasonable.  She has hx of CP underwent myoview scanning in the fall 2011 demonstrating mild ischemia v attenusation with EF 52% and min LV dilatation, cardiac cath 07/31/10: normal coronaries; vigorous LVF.  She was last seen her by myself only a few weeks ago after a hospital stay at [facility], discharged 05/07/16.  She reported standing in her kitchen talking with her son, feeling like something was wrong and fainted.  She reported being told she was told a few seconds, maybe about 45 seconds, her son caught her and did not fall to the ground or suffer trauma.  She had no CP or sensation of palpitations, no SOB.  At that visit she reported generally feeling weak and has felt like she gets lightheaded no near syncope or syncope.   [place] note: Discharge Diagnoses: Principal Problem: Seizure-like activity (RAF-HCC) Active Problems: Allergic rhinitis Essential hypertension (RAF-HCC) Gastroesophageal reflux disease (RAF-HCC) Hypothyroidism (RAF-HCC) Hypokalemia Orthostatic hypotension Sinus bradycardia Hyperthyroidism (RAF-HCC) Resolved Problems: * No resolved hospital problems. [facility] Course:  51 y.o. F w hx of hypothyroidism on Synthroid, history of ASD repair at the age of 34, who follows up at [place] cardiology who was brought to ER for eval from home.  at home she was standing and talking  to her kid around 8-8.30 pm, then she suddenly "feel funny" with some blurred vision, as per her children see "blanked out" for a brief seconds . patient denied any preceding chest pain, palpitation, diaphoresis shortness of nausea vomiting or any other symptoms. No focal weakness. In the ER she was noted to be bradycardic, CT head without any acute finding, labs showed mild hypokalemia. She was admitted for evaluation. Differential included presyncopal episode/seizure episode vs from orthostasis as she was orthostatic in ER. She was seen by neurology. MRI and EEG the brain was unremarkable. Seen by cardiology given her bradycardia however her bradycardia has been worked up in the past his runs low at baseline. Discussed with Dr. Minna Merritts from [place] [facility] who doesn't feel bradycardia is related to her presentation and he has advised follow-up as outpatient, he stated he can plan on o/p loop recorder and monitor if pt chosese to do so from him or she may f/u with her own cardiology. He was hydrated she is orthostatic negative at this point. Her TSH was low and free t 4 high-I have held her Synthroid and advised her to follow-up with her PCP next week check her labs and resume as per primary care. Rest of medical comorbidities are stable and are chronic and she'll continue on her home medication.  05/07/16: TTE Summary Normal left ventricular size and systolic function with no appreciable segmental abnormality. Ejection fraction is visually estimated at 55-60% Structurally normal mitral valve with good mobility. Trace mitral regurgitation. Mild tricuspid regurgitation   *** PMD managed FMLA paperwork and adjusted synthroid *** symptoms? *** orthostatics today *** Holter, SB min  HR 32, no mention of symptoms reported on result  Past Medical History:  Diagnosis Date  . Allergic rhinitis, cause unspecified   . Anemia   . Asthma   . Atrial septal defect    s/p repair;   Echo 08/07/10: EF 55-60%; mild  MR; atrial septal aneurysm; no residual ASD  . Chest pain, unspecified    cardiac cath 07/31/10: normal coronaries; vigorous LVF  . Esophageal reflux   . Fibromyalgia   . Headache(784.0)   . Hematuria, unspecified   . Hypertension    no meds   . Hypothyroidism   . Irritable bowel syndrome   . Low back pain   . Rheumatoid arthritis(714.0)    Dr Herold Harms  . Scoliosis   . Sinoatrial node dysfunction (HCC)    eval. for chronotropic competence completed in past    Past Surgical History:  Procedure Laterality Date  . ASD REPAIR  1985  . TUBAL LIGATION      Current Outpatient Prescriptions  Medication Sig Dispense Refill  . Adalimumab (HUMIRA) 40 MG/0.8ML PSKT Inject into the skin as needed (every other week).    Marland Kitchen albuterol (PROAIR HFA) 108 (90 Base) MCG/ACT inhaler Inhale 2 puffs into the lungs every 6 (six) hours as needed. For shortness of breath and wheezing 1 Inhaler 6  . amLODipine (NORVASC) 5 MG tablet TAKE 1 TABLET DAILY 90 tablet 3  . azelastine (ASTELIN) 0.1 % nasal spray Place 2 sprays into both nostrils at bedtime as needed for rhinitis. Use in each nostril as directed 30 mL 3  . cetirizine (ZYRTEC) 10 MG tablet TAKE ONE TABLET BY MOUTH EVERY DAY 30 tablet 11  . Cholecalciferol (VITAMIN D) 1000 UNITS capsule Take 1,000 Units by mouth daily.      . clonazePAM (KLONOPIN) 0.5 MG tablet Take 1 tablet (0.5 mg total) by mouth 2 (two) times daily as needed for anxiety. 60 tablet 5  . fluticasone (FLONASE) 50 MCG/ACT nasal spray Place 2 sprays into both nostrils daily as needed.    Marland Kitchen levothyroxine (SYNTHROID, LEVOTHROID) 100 MCG tablet Take 1 tablet (100 mcg total) by mouth daily. 30 tablet 1  . omeprazole (PRILOSEC) 40 MG capsule Take 1 capsule (40 mg total) by mouth 2 (two) times daily. (Patient taking differently: Take 40 mg by mouth daily. ) 180 capsule 3  . polyethylene glycol powder (GLYCOLAX/MIRALAX) powder every other day.     . sodium chloride (V-R NASAL SPRAY SALINE)  0.65 % nasal spray Place into the nose. As directed    . Spacer/Aero-Holding Chambers (AEROCHAMBER MV) inhaler Use as instructed 1 each 0  . tiZANidine (ZANAFLEX) 4 MG tablet Take 4 mg by mouth as directed.     No current facility-administered medications for this visit.     Allergies:   Etanercept; Methotrexate derivatives; and Pregabalin   Social History:  The patient  reports that she has never smoked. She has never used smokeless tobacco. She reports that she does not drink alcohol or use drugs.   Family History:  The patient's family history includes Breast cancer in her other; Diabetes in her other; Heart disease in her maternal grandmother; Hypertension in her mother.  ROS:  Please see the history of present illness.   All other systems are reviewed and otherwise negative.   PHYSICAL EXAM: VS:  There were no vitals taken for this visit. BMI: There is no height or weight on file to calculate BMI.  A recheck manually of her BP isi 156/70 (seated)  Well nourished, well developed, in no acute distress  HEENT: normocephalic, atraumatic  Neck: no JVD, carotid bruits or masses Cardiac: ***RRR; bradycardic, no significant murmurs, no rubs, or gallops Lungs: *** CTA b/l, no wheezing, rhonchi or rales  Abd: soft, nontender MS: no deformity or atrophy Ext: *** no edema  Skin: warm and dry, no rash Neuro:  No gross deficits appreciated Psych: euthymic mood, full affect    EKG:  Done 05/13/16 showed SB, 40bpm 09/10/15 was SB 38bpm  03/20/14: TTE Study Conclusions - Left ventricle: The cavity size was normal. Wall thickness was normal. Systolic function was low normal to mildly reduced. The estimated ejection fraction was in the range of 50% to 55%. Wall motion was normal; there were no regional wall motion abnormalities. Features are consistent with a pseudonormal left ventricular filling pattern, with concomitant abnormal relaxation and increased filling pressure  (grade 2 diastolic dysfunction). - Aortic valve: There was trivial regurgitation. - Mitral valve: There was mild regurgitation. - Right ventricle: The cavity size was normal. Systolic function was normal. - Atrial septum: No residual ASD noted. - Tricuspid valve: There was mild-moderate regurgitation. Peak RV-RA gradient (S): 36 mm Hg. - Pulmonary arteries: PA peak pressure: 44 mm Hg (S). - Systemic veins: IVC measured 2.2 cm with > 50% respirophasic variation, suggesting RA pressure 8 mmHg. Impressions: - The patient was profoundly bradycardic during this study. Normal LV size with low normal to mildly decreased systolic function, EF 99991111. Moderate diastolic dysfunction. Normal RV size and systolic function. Mild to moderate TR. Mild pulmonary hypertension.  Recent Labs: 01/27/2016: TSH 0.06 04/15/2016: ALT 16; BUN 13; Creatinine, Ser 0.90; Platelets 189; Potassium 4.3; Sodium 140  No results found for requested labs within last 8760 hours.   CrCl cannot be calculated (Patient's most recent lab result is older than the maximum 21 days allowed.).   Wt Readings from Last 3 Encounters:  05/13/16 134 lb (60.8 kg)  05/13/16 134 lb (60.8 kg)  04/06/16 139 lb 6 oz (63.2 kg)     Other studies reviewed: Additional studies/records reviewed today include: summarized above including [place] record  ASSESSMENT AND PLAN:  1. SN dysfunction  2. She had a syncopal event     [facility] note reviewed, evaluated by cardiology/EP during the stay, SB not felt to have contributed to her event     Record notes she was + for orthostatic changes that improved after hydration     She is counseled on keeping adequately hydrated     Her BP high initially, recheck is 156/70, no changes to her meds for now *** She is made aware of [place] law no driving for 6 months after syncope, at the hospital felt to be secondary to orthostasis, not her bradycardia She has some mild symptoms  reported, there was discussion of a loop, will start with a 2 week holter monitor asked her to use her diary and symptom marker in effort to correlate (or not) any connection with her symptoms to her bradycardia.  She is cautioned and recommended to avoid ladders or similar activities.  She will see her PMD today regarding her thyroid.  2. Hx of ASD repair     Pt follows prophylaxis regime  3. HTN     *** No changes today   Disposition: ***  Current medicines are reviewed at length with the patient today.  The patient did not have any concerns regarding medicines.  Haywood Lasso, PA-C 06/14/2016 12:38 PM  [facility] [place]  [place] [address] [phone] (office)  [phone] (fax)

## 2016-06-15 ENCOUNTER — Ambulatory Visit: Payer: BLUE CROSS/BLUE SHIELD | Admitting: Physician Assistant

## 2016-06-19 LAB — HM MAMMOGRAPHY

## 2016-06-24 ENCOUNTER — Other Ambulatory Visit (INDEPENDENT_AMBULATORY_CARE_PROVIDER_SITE_OTHER): Payer: BLUE CROSS/BLUE SHIELD

## 2016-06-24 ENCOUNTER — Ambulatory Visit: Payer: BLUE CROSS/BLUE SHIELD | Admitting: Pulmonary Disease

## 2016-06-24 DIAGNOSIS — E039 Hypothyroidism, unspecified: Secondary | ICD-10-CM

## 2016-06-25 ENCOUNTER — Encounter: Payer: Self-pay | Admitting: Adult Health

## 2016-06-25 ENCOUNTER — Ambulatory Visit (INDEPENDENT_AMBULATORY_CARE_PROVIDER_SITE_OTHER): Payer: BLUE CROSS/BLUE SHIELD | Admitting: Adult Health

## 2016-06-25 DIAGNOSIS — J4520 Mild intermittent asthma, uncomplicated: Secondary | ICD-10-CM

## 2016-06-25 DIAGNOSIS — J301 Allergic rhinitis due to pollen: Secondary | ICD-10-CM

## 2016-06-25 LAB — TSH: TSH: 0.27 u[IU]/mL — ABNORMAL LOW (ref 0.35–4.50)

## 2016-06-25 MED ORDER — LEVOTHYROXINE SODIUM 88 MCG PO TABS: 88.0000 ug | ORAL_TABLET | Freq: Every day | ORAL | 2 refills | Status: DC

## 2016-06-25 NOTE — Patient Instructions (Signed)
Continue on current regimen .  Use zyrtec and flonase As needed  Drainage  follow up .Dr. [name] Gilford  In 6 months and As needed

## 2016-06-25 NOTE — Assessment & Plan Note (Signed)
Controlled without flare  Control for triggers   Plan  / Patient Instructions  Continue on current regimen .  Use zyrtec and flonase As needed  Drainage  follow up .Dr. [name] Gilford  In 6 months and As needed

## 2016-06-25 NOTE — Progress Notes (Signed)
@Patient  ID: Brittany Hodges, female    DOB: 1966-04-26, 51 y.o.   MRN: [id number]  Chief Complaint  Patient presents with  . Follow-up    Asthma /AR     Referring provider: Colon Branch, MD  HPI: 51 yo never smoker followed for Asthma and AR   06/25/2016 Follow up : Asthma /AR  She presents for a three-month follow-up. Patient says that been doing well since last visit Does have occasional nasal stuffiness and drainage .  No increased cough or wheezing . No increased SABA use.  Has flonase and Zyrtec but does not use on regular basis .    Allergies  Allergen Reactions  . Etanercept   . Methotrexate Derivatives     Caused her WBC to drop  . Pregabalin     Immunization History  Administered Date(s) Administered  . Influenza Split 03/17/2011, 03/17/2012  . Influenza Whole 04/05/2009, 02/21/2010  . Influenza,inj,Quad PF,36+ Mos 03/21/2013, 03/09/2014, 05/24/2015, 01/27/2016  . Pneumococcal Conjugate-13 01/27/2016  . Pneumococcal Polysaccharide-23 03/08/2015  . Td 07/23/2010    Past Medical History:  Diagnosis Date  . Allergic rhinitis, cause unspecified   . Anemia   . Asthma   . Atrial septal defect    s/p repair;   Echo 08/07/10: EF 55-60%; mild MR; atrial septal aneurysm; no residual ASD  . Chest pain, unspecified    cardiac cath 07/31/10: normal coronaries; vigorous LVF  . Esophageal reflux   . Fibromyalgia   . Headache(784.0)   . Hematuria, unspecified   . Hypertension    no meds   . Hypothyroidism   . Irritable bowel syndrome   . Low back pain   . Rheumatoid arthritis(714.0)    Dr Herold Harms  . Scoliosis   . Sinoatrial node dysfunction (HCC)    eval. for chronotropic competence completed in past    Tobacco History: History  Smoking Status  . Never Smoker  Smokeless Tobacco  . Never Used    Comment: never used tobacco   Counseling given: Not Answered   Outpatient Encounter Prescriptions as of 06/25/2016  Medication Sig  . Adalimumab  (HUMIRA) 40 MG/0.8ML PSKT Inject into the skin as needed (every other week).  Marland Kitchen albuterol (PROAIR HFA) 108 (90 Base) MCG/ACT inhaler Inhale 2 puffs into the lungs every 6 (six) hours as needed. For shortness of breath and wheezing  . amLODipine (NORVASC) 5 MG tablet TAKE 1 TABLET DAILY  . azelastine (ASTELIN) 0.1 % nasal spray Place 2 sprays into both nostrils at bedtime as needed for rhinitis. Use in each nostril as directed  . cetirizine (ZYRTEC) 10 MG tablet TAKE ONE TABLET BY MOUTH EVERY DAY  . Cholecalciferol (VITAMIN D) 1000 UNITS capsule Take 1,000 Units by mouth daily.    . clonazePAM (KLONOPIN) 0.5 MG tablet Take 1 tablet (0.5 mg total) by mouth 2 (two) times daily as needed for anxiety.  . fluticasone (FLONASE) 50 MCG/ACT nasal spray Place 2 sprays into both nostrils daily as needed.  Marland Kitchen levothyroxine (SYNTHROID, LEVOTHROID) 88 MCG tablet Take 1 tablet (88 mcg total) by mouth daily before breakfast.  . omeprazole (PRILOSEC) 40 MG capsule Take 1 capsule (40 mg total) by mouth 2 (two) times daily. (Patient taking differently: Take 40 mg by mouth daily. )  . polyethylene glycol powder (GLYCOLAX/MIRALAX) powder every other day.   . sodium chloride (V-R NASAL SPRAY SALINE) 0.65 % nasal spray Place into the nose. As directed  . Spacer/Aero-Holding Chambers (AEROCHAMBER MV) inhaler Use as instructed  .  tiZANidine (ZANAFLEX) 4 MG tablet Take 4 mg by mouth as directed.   No facility-administered encounter medications on file as of 06/25/2016.      Review of Systems  Constitutional:   No  weight loss, night sweats,  Fevers, chills, fatigue, or  lassitude.  HEENT:   No headaches,  Difficulty swallowing,  Tooth/dental problems, or  Sore throat,                No sneezing, itching, ear ache,  +nasal congestion, post nasal drip,   CV:  No chest pain,  Orthopnea, PND, swelling in lower extremities, anasarca, dizziness, palpitations, syncope.   GI  No heartburn, indigestion, abdominal pain,  nausea, vomiting, diarrhea, change in bowel habits, loss of appetite, bloody stools.   Resp: No shortness of breath with exertion or at rest.  No excess mucus, no productive cough,  No non-productive cough,  No coughing up of blood.  No change in color of mucus.  No wheezing.  No chest wall deformity  Skin: no rash or lesions.  GU: no dysuria, change in color of urine, no urgency or frequency.  No flank pain, no hematuria   MS:  No joint pain or swelling.  No decreased range of motion.  No back pain.    Physical Exam  There were no vitals taken for this visit.  GEN: A/Ox3; pleasant , NAD, well nourished    HEENT:  [place]/AT,  EACs-clear, TMs-wnl, NOSE-clear, THROAT-clear, no lesions, no postnasal drip or exudate noted.   NECK:  Supple w/ fair ROM; no JVD; normal carotid impulses w/o bruits; no thyromegaly or nodules palpated; no lymphadenopathy.    RESP  Clear  P & A; w/o, wheezes/ rales/ or rhonchi. no accessory muscle use, no dullness to percussion  CARD:  RRR, no m/r/g, no peripheral edema, pulses intact, no cyanosis or clubbing.  GI:   Soft & nt; nml bowel sounds; no organomegaly or masses detected.   Musco: Warm bil, no deformities or joint swelling noted.   Neuro: alert, no focal deficits noted.    Skin: Warm, no lesions or rashes  Psych:  No change in mood or affect. No depression or anxiety.  No memory loss.  Lab Results:  Imaging: No results found.   Assessment & Plan:   Allergic rhinitis Use flonase and zyrtec As needed    Asthma Controlled without flare  Control for triggers   Plan  / Patient Instructions  Continue on current regimen .  Use zyrtec and flonase As needed  Drainage  follow up .Dr. [name] Gilford  In 6 months and As needed         Rexene Edison, NP 06/25/2016

## 2016-06-25 NOTE — Assessment & Plan Note (Signed)
Use flonase and zyrtec As needed

## 2016-06-25 NOTE — Addendum Note (Signed)
Addended byDamita Dunnings D on: 06/25/2016 01:42 PM   Modules accepted: Orders

## 2016-06-29 ENCOUNTER — Ambulatory Visit: Payer: BLUE CROSS/BLUE SHIELD | Admitting: Physician Assistant

## 2016-07-01 ENCOUNTER — Ambulatory Visit: Payer: BLUE CROSS/BLUE SHIELD | Admitting: Physician Assistant

## 2016-07-03 ENCOUNTER — Ambulatory Visit: Payer: BLUE CROSS/BLUE SHIELD | Admitting: Internal Medicine

## 2016-07-07 ENCOUNTER — Ambulatory Visit: Payer: BLUE CROSS/BLUE SHIELD | Admitting: Internal Medicine

## 2016-07-16 ENCOUNTER — Encounter: Payer: Self-pay | Admitting: Internal Medicine

## 2016-07-22 ENCOUNTER — Ambulatory Visit: Payer: BLUE CROSS/BLUE SHIELD | Admitting: Internal Medicine

## 2016-07-24 ENCOUNTER — Encounter: Payer: Self-pay | Admitting: Internal Medicine

## 2016-07-24 ENCOUNTER — Ambulatory Visit (INDEPENDENT_AMBULATORY_CARE_PROVIDER_SITE_OTHER): Payer: BLUE CROSS/BLUE SHIELD | Admitting: Internal Medicine

## 2016-07-24 VITALS — BP 140/75 | HR 43 | Ht 69.0 in | Wt 136.0 lb

## 2016-07-24 DIAGNOSIS — R001 Bradycardia, unspecified: Secondary | ICD-10-CM | POA: Diagnosis not present

## 2016-07-24 DIAGNOSIS — I495 Sick sinus syndrome: Secondary | ICD-10-CM

## 2016-07-24 NOTE — Progress Notes (Signed)
Patient Care Team: Colon Branch, MD as PCP - General (Internal Medicine) Deboraha Sprang, MD as Consulting Physician (Cardiology) Bo Merino, MD as Consulting Physician (Rheumatology) Senaida Ores, MD as Referring Physician (Obstetrics and Gynecology)   HPI  Brittany Hodges is a 51 y.o. female is seen in followup for junctional rhythm in the setting of atrial septal defect repair. In the past she has had assessments of chrontropic competence which have been adequate.  She does have mild limitations but has problems with exercise.   She was recently hospitalized at [facility] because of syncope. It was felt following EP evaluation that this was neurally mediated and not arrhythmic not withstanding her history of sinus node dysfunction  Apparently ( not documented) had a second episode in the hospital without any comments in the notes, suggesting that there was no significant bradycardia. The notes describes seizure versus pseudoseizure, possibly because of eyelids were described as fluttering as opposed to rolling back  [place] records were reviewed   She continues to have episodic chest discomfort. These are unrelated to exertion. She is exercising 3-4 days per week at the gym  She underwent myoview scanning in the fall 2011 demonstrating mild ischemia v attenusation with EF 52% and min LV dilitation.     Past Medical History:  Diagnosis Date  . Allergic rhinitis, cause unspecified   . Anemia   . Asthma   . Atrial septal defect    s/p repair;   Echo 08/07/10: EF 55-60%; mild MR; atrial septal aneurysm; no residual ASD  . Chest pain, unspecified    cardiac cath 07/31/10: normal coronaries; vigorous LVF  . Esophageal reflux   . Fibromyalgia   . Headache(784.0)   . Hematuria, unspecified   . Hypertension    no meds   . Hypothyroidism   . Irritable bowel syndrome   . Low back pain   . Rheumatoid arthritis(714.0)    Dr Herold Harms  . Scoliosis   .  Sinoatrial node dysfunction (HCC)    eval. for chronotropic competence completed in past    Past Surgical History:  Procedure Laterality Date  . ASD REPAIR  1985  . TUBAL LIGATION      Current Outpatient Prescriptions  Medication Sig Dispense Refill  . albuterol (PROAIR HFA) 108 (90 Base) MCG/ACT inhaler Inhale 2 puffs into the lungs every 6 (six) hours as needed. For shortness of breath and wheezing 1 Inhaler 6  . amLODipine (NORVASC) 5 MG tablet TAKE 1 TABLET DAILY 90 tablet 3  . azelastine (ASTELIN) 0.1 % nasal spray Place 2 sprays into both nostrils at bedtime as needed for rhinitis. Use in each nostril as directed 30 mL 3  . cetirizine (ZYRTEC) 10 MG tablet TAKE ONE TABLET BY MOUTH EVERY DAY 30 tablet 11  . Cholecalciferol (VITAMIN D) 1000 UNITS capsule Take 1,000 Units by mouth daily.      . fluticasone (FLONASE) 50 MCG/ACT nasal spray Place 2 sprays into both nostrils daily as needed.    Marland Kitchen levothyroxine (SYNTHROID, LEVOTHROID) 88 MCG tablet Take 1 tablet (88 mcg total) by mouth daily before breakfast. 30 tablet 2  . omeprazole (PRILOSEC) 40 MG capsule Take 1 capsule (40 mg total) by mouth 2 (two) times daily. (Patient taking differently: Take 40 mg by mouth daily. ) 180 capsule 3  . sodium chloride (V-R NASAL SPRAY SALINE) 0.65 % nasal spray Place into the nose. As directed    . Spacer/Aero-Holding Chambers (AEROCHAMBER  MV) inhaler Use as instructed 1 each 0   No current facility-administered medications for this visit.     Allergies  Allergen Reactions  . Etanercept   . Methotrexate Derivatives     Caused her WBC to drop  . Pregabalin     Review of Systems negative except from HPI and PMH  Physical Exam BP 140/75   Pulse (!) 43   Ht 5\' 9"  (1.753 m)   Wt 136 lb (61.7 kg)   SpO2 98%   BMI 20.08 kg/m   Well developed and nourished in no acute distress HENT normal Neck supple with JVP-flat Clear slow but Regular rate and rhythm, 2/6 systolic m Abd-soft with  active BS No Clubbing cyanosis edema Skin-warm and dry A & Oriented  Grossly normal sensory and motor function  ECG demonstrates sinus rhythm 43   Intervals 17/08/44   Assessment and  Plan  Sinus node dysfunction   Atrial septal defect repair  Hypertension  Chest pain  Syncope  Agree with the interpretations over at [facility] that this was unlikely arrhythmic syncope. At this point we will continue her on her current course. If she were to have recurrent events we discussed the utility of an implantable loop recorder   Stable with sinus bradycardia.  In the event that she develops symptoms of lightheadedness or exercise intolerance, it would be appropriate to pursue pacing. At this point it seems largely asymptomatic.

## 2016-07-27 ENCOUNTER — Ambulatory Visit (INDEPENDENT_AMBULATORY_CARE_PROVIDER_SITE_OTHER): Payer: BLUE CROSS/BLUE SHIELD | Admitting: Internal Medicine

## 2016-07-27 ENCOUNTER — Encounter: Payer: Self-pay | Admitting: Internal Medicine

## 2016-07-27 VITALS — BP 124/78 | HR 47 | Temp 97.8°F | Resp 14 | Ht 68.0 in | Wt 134.4 lb

## 2016-07-27 DIAGNOSIS — Z0000 Encounter for general adult medical examination without abnormal findings: Secondary | ICD-10-CM

## 2016-07-27 DIAGNOSIS — E039 Hypothyroidism, unspecified: Secondary | ICD-10-CM

## 2016-07-27 LAB — LIPID PANEL
Cholesterol: 142 mg/dL (ref 0–200)
HDL: 57.9 mg/dL (ref >39.00)
LDL Cholesterol: 70 mg/dL (ref 0–99)
NonHDL: 83.66
Total CHOL/HDL Ratio: 2
Triglycerides: 68 mg/dL (ref 0.0–149.0)
VLDL: 13.6 mg/dL (ref 0.0–40.0)

## 2016-07-27 MED ORDER — AMLODIPINE BESYLATE 5 MG PO TABS: 5.0000 mg | ORAL_TABLET | Freq: Every day | ORAL | 3 refills | Status: DC

## 2016-07-27 MED ORDER — AMLODIPINE BESYLATE 5 MG PO TABS: 5.0000 mg | ORAL_TABLET | Freq: Every day | ORAL | 0 refills | Status: DC

## 2016-07-27 NOTE — Progress Notes (Signed)
Pre visit review using our clinic review tool, if applicable. No additional management support is needed unless otherwise documented below in the visit note. 

## 2016-07-27 NOTE — Assessment & Plan Note (Signed)
HTN: Refill amlodipine, no change Hypothyroidism: Reports good compliance with Synthroid, check a TSH, aware that may need another TSH in few weeks. Sinoatrial dysfunction: Note from cardiology reviewed, stable Other problems seems to be stable RTC 6-8 months

## 2016-07-27 NOTE — Patient Instructions (Signed)
GO TO THE LAB : Get the blood work     GO TO THE FRONT DESK Schedule your next appointment for a  checkup in 6-8 months    

## 2016-07-27 NOTE — Progress Notes (Signed)
Subjective:    Patient ID: Brittany Hodges, female    DOB: 1965-11-27, 51 y.o.   MRN: [id number]  DOS:  07/27/2016 Type of visit - description : CPX Interval history: In general feeling well, ran out of amlodipine a week ago, BP is normal except for 2 slightly high readings last week   Review of Systems Few days ago, developed pain at the right posterior upper back, pain increases when she twists her torso. No fever, chills, cough, injury or fall. No rash  Other than above, a 14 point review of systems is negative      Past Medical History:  Diagnosis Date  . Allergic rhinitis, cause unspecified   . Anemia   . Asthma   . Atrial septal defect    s/p repair;   Echo 08/07/10: EF 55-60%; mild MR; atrial septal aneurysm; no residual ASD  . Chest pain, unspecified    cardiac cath 07/31/10: normal coronaries; vigorous LVF  . Esophageal reflux   . Fibromyalgia   . Headache(784.0)   . Hematuria, unspecified   . Hypertension    no meds   . Hypothyroidism   . Irritable bowel syndrome   . Low back pain   . Rheumatoid arthritis(714.0)    Dr Herold Harms  . Scoliosis   . Sinoatrial node dysfunction (HCC)    eval. for chronotropic competence completed in past    Past Surgical History:  Procedure Laterality Date  . ASD REPAIR  1985  . TUBAL LIGATION      Social History   Social History  . Marital status: Single    Spouse name: N/A  . Number of children: 3  . Years of education: N/A   Occupational History  . Curator   Social History Main Topics  . Smoking status: Never Smoker  . Smokeless tobacco: Never Used     Comment: never used tobacco  . Alcohol use No  . Drug use: No  . Sexual activity: Not Currently   Other Topics Concern  . Not on file   Social History Narrative   Household-- pt and youngest son      Family History  Problem Relation Age of Onset  . Hypertension Mother   . Heart disease Maternal Grandmother   .  Breast cancer Other     aunt  . Diabetes Other     GM  . Colon cancer Neg Hx      Allergies as of 07/27/2016      Reactions   Etanercept    Methotrexate Derivatives    Caused her WBC to drop   Pregabalin       Medication List       Accurate as of 07/27/16 11:55 AM. Always use your most recent med list.          AEROCHAMBER MV inhaler Use as instructed   albuterol 108 (90 Base) MCG/ACT inhaler Commonly known as:  PROAIR HFA Inhale 2 puffs into the lungs every 6 (six) hours as needed. For shortness of breath and wheezing   amLODipine 5 MG tablet Commonly known as:  NORVASC Take 1 tablet (5 mg total) by mouth daily.   amLODipine 5 MG tablet Commonly known as:  NORVASC Take 1 tablet (5 mg total) by mouth daily.   azelastine 0.1 % nasal spray Commonly known as:  ASTELIN Place 2 sprays into both nostrils at bedtime as needed for rhinitis. Use in each nostril as directed   cetirizine 10 MG  tablet Commonly known as:  ZYRTEC TAKE ONE TABLET BY MOUTH EVERY DAY   fluticasone 50 MCG/ACT nasal spray Commonly known as:  FLONASE Place 2 sprays into both nostrils daily as needed.   levothyroxine 88 MCG tablet Commonly known as:  SYNTHROID, LEVOTHROID Take 1 tablet (88 mcg total) by mouth daily before breakfast.   omeprazole 40 MG capsule Commonly known as:  PRILOSEC Take 1 capsule (40 mg total) by mouth 2 (two) times daily.   V-R NASAL SPRAY SALINE 0.65 % nasal spray Generic drug:  sodium chloride Place into the nose. As directed   Vitamin D 1000 units capsule Take 1,000 Units by mouth daily.          Objective:   Physical Exam  Musculoskeletal:       Arms:  BP 124/78 (BP Location: Left Arm, Patient Position: Sitting, Cuff Size: Small)   Pulse (!) 47   Temp 97.8 F (36.6 C) (Oral)   Resp 14   Ht 5\' 8"  (1.727 m)   Wt 134 lb 6 oz (61 kg)   LMP 04/20/2016 (Approximate)   SpO2 99%   BMI 20.43 kg/m   General:   Well developed, well nourished . NAD.    Neck: No  thyromegaly  HEENT:  Normocephalic . Face symmetric, atraumatic Lungs:  CTA B Normal respiratory effort, no intercostal retractions, no accessory muscle use. Heart: Bradycardic,  no murmur.  No pretibial edema bilaterally  Abdomen:  Not distended, soft, non-tender. No rebound or rigidity.   Skin: Exposed areas without rash. Not pale. Not jaundice Neurologic:  alert & oriented X3.  Speech normal, gait appropriate for age and unassisted Strength symmetric and appropriate for age.  Psych: Cognition and judgment appear intact.  Cooperative with normal attention span and concentration.  Behavior appropriate. No anxious or depressed appearing.    Assessment & Plan:   Assessment > HTN Hypothyroidism GERD- dysphagia -Dr. Norville Haggard EGD from 04/02/2014 (Dr Delrae Alfred): Narrowing of proximal esophagus, question of extrinsic extrinsic compression, EUS showing no intrinsic esophageal mass but the spine was seen in close proximity to the esophagus probably resulting in extrinsic compression. The endoscopist recommend repeat CT neck and chest in 3 months.saw ENT 04-2015 for dysphagia,had a scope,  rx PPI Asthma, uses alb rarely  CV: --Atrial septal defect: Status post repair, no residual ASD --Sinoatrial  Dysfunction, sees Dr Caryl Comes   --CP: cath 2012, normal coronaries MSK: --Rheumatoid arthritis, HUMIRA per  Dr. Herold Harms --Fibromyalgia IBS (Miralax prn constipation)   Admission: syncope 04-2016 (unlikely to be arrhythmia related per cards )  PLAN:  HTN: Refill amlodipine, no change Hypothyroidism: Reports good compliance with Synthroid, check a TSH, aware that may need another TSH in few weeks. Sinoatrial dysfunction: Note from cardiology reviewed, stable Other problems seems to be stable RTC 6-8 months

## 2016-07-27 NOTE — Assessment & Plan Note (Signed)
Td 2012, Pneumovax   2016; prevnar 01-2016; had a  Flu shot   Female care: Sees gynecology in [facility]. Last documented MMG 2015, had one this year CCS: Cscope  05/07/2011 negative, 10 years, Dr. Shana Chute  diet and exercise -- doing better, healthier diet, exercises x3/week  Labs : FLP, TSH

## 2016-07-28 LAB — TSH: TSH: 1.75 u[IU]/mL (ref 0.35–4.50)

## 2016-07-28 NOTE — Addendum Note (Signed)
Addended byDamita Dunnings D on: 07/28/2016 04:51 PM   Modules accepted: Orders

## 2016-08-12 ENCOUNTER — Ambulatory Visit: Payer: BLUE CROSS/BLUE SHIELD | Admitting: Internal Medicine

## 2016-09-02 ENCOUNTER — Ambulatory Visit: Payer: BLUE CROSS/BLUE SHIELD | Admitting: Internal Medicine

## 2016-09-14 ENCOUNTER — Ambulatory Visit: Payer: BLUE CROSS/BLUE SHIELD | Admitting: Family

## 2016-09-16 ENCOUNTER — Ambulatory Visit (INDEPENDENT_AMBULATORY_CARE_PROVIDER_SITE_OTHER): Payer: BLUE CROSS/BLUE SHIELD | Admitting: Family Medicine

## 2016-09-16 VITALS — BP 124/73 | HR 60 | Temp 98.4°F | Ht 68.0 in | Wt 132.0 lb

## 2016-09-16 DIAGNOSIS — M654 Radial styloid tenosynovitis [de Quervain]: Secondary | ICD-10-CM

## 2016-09-16 DIAGNOSIS — G5602 Carpal tunnel syndrome, left upper limb: Secondary | ICD-10-CM

## 2016-09-16 DIAGNOSIS — M25532 Pain in left wrist: Secondary | ICD-10-CM

## 2016-09-16 NOTE — Patient Instructions (Addendum)
It seems that you have 2 issues with your left wrist- tendonitis of your thumb and probably also carpal tunnel syndrome Please get a larger wrist brace that also holds the thumb still (called a thumb spica brace) and wear it as much as is practical for the next 2 weeks. (You can take it off to shower, etc.) You may also certainly use aleve as needed.  Ice may help too!   Please let me know if this is NOT helpful- you can give me a call. In this case we will have you see a hand specialist- I can refer you

## 2016-09-16 NOTE — Progress Notes (Signed)
[facility] [address] [place], [place] [address] [phone]  Date:  09/16/2016   Name:  Brittany Hodges   DOB:  10-21-1965   MRN:  [id number]  PCP:  Kathlene November, MD    Chief Complaint: Wrist Pain (Left x 1 month)   History of Present Illness:  Brittany Hodges is a 51 y.o. very pleasant female patient who presents with the following:  She has noted left wrist pain for about one month.  She will have pain radiating up to her elbow at times She is not aware of any injury, except years ago she had some sort of wrist injury and had a wrist splint left over from this time.  She is right handed and can still do her job, etc She has noted some numbness in her left thumb and index finger- it can be difficult to open a jar, etc It can hurt at night as well- she feels like she needs to hold the wrist in extension  She tried a wrist splint that she had from the past but it may be too tight for her now as it makes her hand swell when she wears it She has not yet tried any medication for her hand pain  She is otherwise generally in good health   Lab Results  Component Value Date   TSH 1.75 07/27/2016     Patient Active Problem List   Diagnosis Date Noted  . Asthma 04/06/2016  . Laryngopharyngeal reflux (LPR) 04/06/2016  . Constipation 08/06/2015  . PCP NOTES >>>>> 03/09/2015  . Annual physical exam 12/21/2014  . Cardiomegaly, by MRI 03/16/2014  . Leukopenia 03/10/2014  . Lightheadedness 02/10/2011  . SINOATRIAL NODE DYSFUNCTION 08/06/2008  . ATRIAL SEPTAL DEFECT 06/29/2007  . Hypothyroidism 03/21/2007  . Essential hypertension 03/21/2007  . Allergic rhinitis 03/21/2007  . GERD-- dysphagia 03/21/2007  . CONSTIPATION 03/21/2007  . IRRITABLE BOWEL SYNDROME 03/21/2007  . RA (rheumatoid arthritis) ([place]) 03/21/2007  . LOW BACK PAIN 03/21/2007  . FIBROMYALGIA 03/21/2007    Past Medical History:  Diagnosis  Date  . Allergic rhinitis, cause unspecified   . Anemia   . Asthma   . Atrial septal defect    s/p repair;   Echo 08/07/10: EF 55-60%; mild MR; atrial septal aneurysm; no residual ASD  . Chest pain, unspecified    cardiac cath 07/31/10: normal coronaries; vigorous LVF  . Esophageal reflux   . Fibromyalgia   . Headache(784.0)   . Hematuria, unspecified   . Hypertension    no meds   . Hypothyroidism   . Irritable bowel syndrome   . Low back pain   . Rheumatoid arthritis(714.0)    Dr Herold Harms  . Scoliosis   . Sinoatrial node dysfunction (HCC)    eval. for chronotropic competence completed in past    Past Surgical History:  Procedure Laterality Date  . ASD REPAIR  1985  . TUBAL LIGATION      Social History  Substance Use Topics  . Smoking status: Never Smoker  . Smokeless tobacco: Never Used     Comment: never used tobacco  . Alcohol use No    Family History  Problem Relation Age of Onset  . Hypertension Mother   . Heart disease Maternal Grandmother   . Breast cancer Other     aunt  . Diabetes Other     GM  . Colon cancer Neg Hx  Allergies  Allergen Reactions  . Etanercept   . Methotrexate Derivatives     Caused her WBC to drop  . Pregabalin     Medication list has been reviewed and updated.  Current Outpatient Prescriptions on File Prior to Visit  Medication Sig Dispense Refill  . albuterol (PROAIR HFA) 108 (90 Base) MCG/ACT inhaler Inhale 2 puffs into the lungs every 6 (six) hours as needed. For shortness of breath and wheezing 1 Inhaler 6  . amLODipine (NORVASC) 5 MG tablet Take 1 tablet (5 mg total) by mouth daily. 15 tablet 0  . azelastine (ASTELIN) 0.1 % nasal spray Place 2 sprays into both nostrils at bedtime as needed for rhinitis. Use in each nostril as directed 30 mL 3  . Cholecalciferol (VITAMIN D) 1000 UNITS capsule Take 1,000 Units by mouth daily.      . fluticasone (FLONASE) 50 MCG/ACT nasal spray Place 2 sprays into both nostrils daily as  needed.    Marland Kitchen levothyroxine (SYNTHROID, LEVOTHROID) 88 MCG tablet Take 1 tablet (88 mcg total) by mouth daily before breakfast. 30 tablet 2  . omeprazole (PRILOSEC) 40 MG capsule Take 1 capsule (40 mg total) by mouth 2 (two) times daily. (Patient taking differently: Take 40 mg by mouth daily. ) 180 capsule 3  . sodium chloride (V-R NASAL SPRAY SALINE) 0.65 % nasal spray Place into the nose. As directed    . Spacer/Aero-Holding Chambers (AEROCHAMBER MV) inhaler Use as instructed 1 each 0  . cetirizine (ZYRTEC) 10 MG tablet TAKE ONE TABLET BY MOUTH EVERY DAY (Patient not taking: Reported on 09/16/2016) 30 tablet 11   No current facility-administered medications on file prior to visit.     Review of Systems:  As per HPI- otherwise negative. No fever, chills, CP, SOB, rash  Physical Examination: Vitals:   09/16/16 1550  BP: 124/73  Pulse: 60  Temp: 98.4 F (36.9 C)   Vitals:   09/16/16 1550  Weight: 132 lb (59.9 kg)  Height: 5\' 8"  (1.727 m)   Body mass index is 20.07 kg/m. Ideal Body Weight: Weight in (lb) to have BMI = 25: 164.1  GEN: WDWN, NAD, Non-toxic, A & O x 3, slim build, looks well  HEENT: Atraumatic, Normocephalic. Neck supple. No masses, No LAD. Ears and Nose: No external deformity. CV: RRR, No M/G/R. No JVD. No thrill. No extra heart sounds. PULM: CTA B, no wheezes, crackles, rhonchi. No retractions. No resp. distress. No accessory muscle use. EXTR: No c/c/e NEURO Normal gait.  PSYCH: Normally interactive. Conversant. Not depressed or anxious appearing.  Calm demeanor.  Neither wrist is swollen or warm, normal ROM The left wrist is tender over the carpal tunnel to percussion and with wrist flexion.  However, she also has a positive finkelsteins test and weaker grip on the left    Assessment and Plan: Left wrist pain  Carpal tunnel syndrome of left wrist  Tenosynovitis, de Quervain  Here today with a left wrist problem which seems due to 2 factors Will have her  try a larger thumb spica splint which fits her better for about 2 weeks. If not better (or if getting worse) she will alerg me and I will refer her to hand   Signed Lamar Blinks, MD

## 2016-09-16 NOTE — Progress Notes (Signed)
Pre visit review using our clinic tool,if applicable. No additional management support is needed unless otherwise documented below in the visit note.  

## 2016-10-13 ENCOUNTER — Other Ambulatory Visit: Payer: Self-pay | Admitting: Internal Medicine

## 2016-12-07 ENCOUNTER — Other Ambulatory Visit (INDEPENDENT_AMBULATORY_CARE_PROVIDER_SITE_OTHER): Payer: BLUE CROSS/BLUE SHIELD

## 2016-12-07 DIAGNOSIS — E039 Hypothyroidism, unspecified: Secondary | ICD-10-CM

## 2016-12-08 LAB — TSH: TSH: 5.22 u[IU]/mL — ABNORMAL HIGH (ref 0.35–4.50)

## 2016-12-10 MED ORDER — LEVOTHYROXINE SODIUM 100 MCG PO TABS: 100.0000 ug | ORAL_TABLET | Freq: Every day | ORAL | 1 refills | Status: DC

## 2016-12-10 NOTE — Addendum Note (Signed)
Addended byDamita Dunnings D on: 12/10/2016 04:53 PM   Modules accepted: Orders

## 2016-12-17 ENCOUNTER — Other Ambulatory Visit: Payer: Self-pay | Admitting: Internal Medicine

## 2016-12-25 ENCOUNTER — Ambulatory Visit: Payer: BLUE CROSS/BLUE SHIELD | Admitting: Adult Health

## 2017-01-01 ENCOUNTER — Ambulatory Visit: Payer: BLUE CROSS/BLUE SHIELD | Admitting: Adult Health

## 2017-01-21 ENCOUNTER — Other Ambulatory Visit: Payer: Self-pay | Admitting: Internal Medicine

## 2017-01-27 ENCOUNTER — Encounter: Payer: Self-pay | Admitting: Internal Medicine

## 2017-01-27 ENCOUNTER — Ambulatory Visit (INDEPENDENT_AMBULATORY_CARE_PROVIDER_SITE_OTHER): Payer: BLUE CROSS/BLUE SHIELD | Admitting: Internal Medicine

## 2017-01-27 VITALS — BP 122/70 | HR 50 | Temp 98.1°F | Resp 14 | Ht 68.0 in | Wt 126.5 lb

## 2017-01-27 DIAGNOSIS — E039 Hypothyroidism, unspecified: Secondary | ICD-10-CM | POA: Diagnosis not present

## 2017-01-27 DIAGNOSIS — R634 Abnormal weight loss: Secondary | ICD-10-CM | POA: Diagnosis not present

## 2017-01-27 DIAGNOSIS — Z09 Encounter for follow-up examination after completed treatment for conditions other than malignant neoplasm: Secondary | ICD-10-CM

## 2017-01-27 DIAGNOSIS — M549 Dorsalgia, unspecified: Secondary | ICD-10-CM

## 2017-01-27 NOTE — Patient Instructions (Signed)
GO TO THE LAB : Get the blood work     GO TO THE FRONT DESK Schedule your next appointment for a  Check up in 4 months  MYFITNESSPAL

## 2017-01-27 NOTE — Progress Notes (Addendum)
Subjective:    Patient ID: Brittany Hodges, female    DOB: July 27, 1965, 51 y.o.   MRN: [id number]  DOS:  01/27/2017 Type of visit - description : rov Interval history: Hypothyroidism: Good compliance w/ medication, due for labs She is also concerned about weight loss. About 4-6 weeks ago she changed her diet and increased her physical activity. She is eating less carbohydrates, more vegetables and in general more healthy. Also complained of upper back pain, left side, worse with certain neck movements but no neck pain per se. No upper or lower extremity paresthesias   Wt Readings from Last 3 Encounters:  01/27/17 126 lb 8 oz (57.4 kg)  09/16/16 132 lb (59.9 kg)  07/27/16 134 lb 6 oz (61 kg)     Review of Systems Denies fever chills Sometimes she has night sweats but she and her gynecologist believe is due to menopause. No nausea, vomiting, diarrhea. No blood in the stools. No abdominal pain specifically no postprandial abdominal pain. She still has some issues with dysphagia, nothing new. No cough No gross hematuria  Past Medical History:  Diagnosis Date  . Allergic rhinitis, cause unspecified   . Anemia   . Asthma   . Atrial septal defect    s/p repair;   Echo 08/07/10: EF 55-60%; mild MR; atrial septal aneurysm; no residual ASD  . Chest pain, unspecified    cardiac cath 07/31/10: normal coronaries; vigorous LVF  . Esophageal reflux   . Fibromyalgia   . Headache(784.0)   . Hematuria, unspecified   . Hypertension    no meds   . Hypothyroidism   . Irritable bowel syndrome   . Low back pain   . Rheumatoid arthritis(714.0)    Dr Herold Harms  . Scoliosis   . Sinoatrial node dysfunction (HCC)    eval. for chronotropic competence completed in past    Past Surgical History:  Procedure Laterality Date  . ASD REPAIR  1985  . TUBAL LIGATION      Social History   Social History  . Marital status: Single    Spouse name: N/A  . Number of children: 3  . Years of  education: N/A   Occupational History  . Curator   Social History Main Topics  . Smoking status: Never Smoker  . Smokeless tobacco: Never Used     Comment: never used tobacco  . Alcohol use No  . Drug use: No  . Sexual activity: Not Currently   Other Topics Concern  . Not on file   Social History Narrative   Household-- pt and youngest son       Allergies as of 01/27/2017      Reactions   Etanercept    Methotrexate Derivatives    Caused her WBC to drop   Pregabalin       Medication List       Accurate as of 01/27/17 11:59 PM. Always use your most recent med list.          AEROCHAMBER MV inhaler Use as instructed   albuterol 108 (90 Base) MCG/ACT inhaler Commonly known as:  PROAIR HFA Inhale 2 puffs into the lungs every 6 (six) hours as needed. For shortness of breath and wheezing   amLODipine 5 MG tablet Commonly known as:  NORVASC Take 1 tablet (5 mg total) by mouth daily.   azelastine 0.1 % nasal spray Commonly known as:  ASTELIN Place 2 sprays into both nostrils at bedtime as needed for  rhinitis. Use in each nostril as directed   cetirizine 10 MG tablet Commonly known as:  ZYRTEC TAKE ONE TABLET BY MOUTH EVERY DAY   fluticasone 50 MCG/ACT nasal spray Commonly known as:  FLONASE Place 2 sprays into both nostrils daily as needed.   levothyroxine 100 MCG tablet Commonly known as:  SYNTHROID, LEVOTHROID Take 1 tablet (100 mcg total) by mouth daily before breakfast.   omeprazole 40 MG capsule Commonly known as:  PRILOSEC Take 1 capsule (40 mg total) by mouth 2 (two) times daily.   V-R NASAL SPRAY SALINE 0.65 % nasal spray Generic drug:  sodium chloride Place into the nose. As directed   Vitamin D 1000 units capsule Take 1,000 Units by mouth daily.            Discharge Care Instructions        Start     Ordered   01/27/17 0000  Comp Met (CMET)     01/27/17 1642   01/27/17 0000  CBC w/Diff     01/27/17  1642   01/27/17 0000  Hemoglobin A1c     01/27/17 1642   01/27/17 0000  TSH     01/27/17 1642   01/27/17 0000  T3, free     01/27/17 1642   01/27/17 0000  T4, free     01/27/17 1642         Objective:   Physical Exam BP 122/70 (BP Location: Left Arm, Patient Position: Sitting, Cuff Size: Small)   Pulse (!) 50   Temp 98.1 F (36.7 C) (Oral)   Resp 14   Ht 5' 8" (1.727 m)   Wt 126 lb 8 oz (57.4 kg)   SpO2 96%   BMI 19.23 kg/m  General:   Well developed, well nourished . NAD.  HEENT:  Normocephalic . Face symmetric, atraumatic Neck: No thyromegaly or mass. Range of motion is normal, not TTP at the cervical spine Lungs:  CTA B Normal respiratory effort, no intercostal retractions, no accessory muscle use. Heart: RRR,  no murmur.  no pretibial edema bilaterally  Abdomen:  Not distended, soft, non-tender. No rebound or rigidity.  Skin: Not pale. Not jaundice Neurologic:  alert & oriented X3.  Speech normal, gait appropriate for age and unassisted Psych--  Cognition and judgment appear intact.  Cooperative with normal attention span and concentration.  Behavior appropriate. No anxious or depressed appearing.    Assessment & Plan:  Assessment > HTN Hypothyroidism GERD- dysphagia -Dr. Norville Haggard EGD from 04/02/2014 (Dr Delrae Alfred): Narrowing of proximal esophagus, question of extrinsic extrinsic compression, EUS showing no intrinsic esophageal mass but the spine was seen in close proximity to the esophagus probably resulting in extrinsic compression. The endoscopist recommend repeat CT neck and chest in 3 months.saw ENT 04-2015 for dysphagia,had a scope,  rx PPI Asthma, uses alb rarely  CV: --Atrial septal defect: Status post repair, no residual ASD --Sinoatrial  Dysfunction, sees Dr Caryl Comes   --CP: cath 2012, normal coronaries MSK: --Rheumatoid arthritis,   Dr. Herold Harms --Fibromyalgia IBS (Miralax prn constipation)   Admission: syncope 04-2016 (unlikely to be arrhythmia  related per cards )  PLAN:  Hypothyroidism: Last TSH is slightly elevated, good compliance with meds, checking labs: TSH, free T3 and T4. Weight loss: The patient actually started to eat healthy and decrease her carbohydrate intake 4-6 weeks ago, she has lost weight but actually feels well. She has "night sweats" but she felt is related to menopause. Plan is to get a CMP, CBC,  A1c. We also talked about healthy/ nutritious but abundant diet, reintroduce some carbohydrates. Considering calorie counting. Reassess in 4 months Upper back pain: No neck pain per se, likely a MSK issue, 2 stretching modalities discussed. Call if no better. Tylenol as needed. R.A.: Currently on no medications, has not seen Dr. Davenshwar in a while. Sxs are minimal. Recommend to see rheumatology at her convenience RTC 4 months  

## 2017-01-27 NOTE — Progress Notes (Signed)
Pre visit review using our clinic review tool, if applicable. No additional management support is needed unless otherwise documented below in the visit note. 

## 2017-01-28 LAB — COMPREHENSIVE METABOLIC PANEL
ALT: 20 U/L (ref 0–35)
AST: 30 U/L (ref 0–37)
Albumin: 4.5 g/dL (ref 3.5–5.2)
Alkaline Phosphatase: 65 U/L (ref 39–117)
BUN: 11 mg/dL (ref 6–23)
CO2: 29 mEq/L (ref 19–32)
Calcium: 10.7 mg/dL — ABNORMAL HIGH (ref 8.4–10.5)
Chloride: 103 mEq/L (ref 96–112)
Creatinine, Ser: 0.79 mg/dL (ref 0.40–1.20)
GFR: 97.84 mL/min (ref >60.00)
Glucose, Bld: 82 mg/dL (ref 70–99)
Potassium: 3.9 mEq/L (ref 3.5–5.1)
Sodium: 139 mEq/L (ref 135–145)
Total Bilirubin: 0.5 mg/dL (ref 0.2–1.2)
Total Protein: 8.7 g/dL — ABNORMAL HIGH (ref 6.0–8.3)

## 2017-01-28 LAB — CBC WITH DIFFERENTIAL/PLATELET
Basophils Absolute: 0 10*3/uL (ref 0.0–0.1)
Basophils Relative: 0.9 % (ref 0.0–3.0)
Eosinophils Absolute: 0 10*3/uL (ref 0.0–0.7)
Eosinophils Relative: 0.9 % (ref 0.0–5.0)
HCT: 40.4 % (ref 36.0–46.0)
Hemoglobin: 12.7 g/dL (ref 12.0–15.0)
Lymphocytes Relative: 42.3 % (ref 12.0–46.0)
Lymphs Abs: 1.1 10*3/uL (ref 0.7–4.0)
MCHC: 31.5 g/dL (ref 30.0–36.0)
MCV: 88.7 fl (ref 78.0–100.0)
Monocytes Absolute: 0.4 10*3/uL (ref 0.1–1.0)
Monocytes Relative: 15.6 % — ABNORMAL HIGH (ref 3.0–12.0)
Neutro Abs: 1 10*3/uL — ABNORMAL LOW (ref 1.4–7.7)
Neutrophils Relative %: 40.3 % — ABNORMAL LOW (ref 43.0–77.0)
Platelets: 138 10*3/uL — ABNORMAL LOW (ref 150.0–400.0)
RBC: 4.55 Mil/uL (ref 3.87–5.11)
RDW: 13.3 % (ref 11.5–15.5)
WBC: 2.6 10*3/uL — ABNORMAL LOW (ref 4.0–10.5)

## 2017-01-28 LAB — T4, FREE: Free T4: 1.4 ng/dL (ref 0.60–1.60)

## 2017-01-28 LAB — HEMOGLOBIN A1C: Hgb A1c MFr Bld: 6 % (ref 4.6–6.5)

## 2017-01-28 LAB — TSH: TSH: 0.1 u[IU]/mL — ABNORMAL LOW (ref 0.35–4.50)

## 2017-01-28 LAB — T3, FREE: T3, Free: 3.9 pg/mL (ref 2.3–4.2)

## 2017-01-28 NOTE — Assessment & Plan Note (Addendum)
Hypothyroidism: Last TSH is slightly elevated, good compliance with meds, checking labs: TSH, free T3 and T4. Weight loss: The patient actually started to eat healthy and decrease her carbohydrate intake 4-6 weeks ago, she has lost weight but actually feels well. She has "night sweats" but she felt is related to menopause. Plan is to get a CMP, CBC, A1c. We also talked about healthy/ nutritious but abundant diet, reintroduce some carbohydrates. Considering calorie counting. Reassess in 4 months Upper back pain: No neck pain per se, likely a MSK issue, 2 stretching modalities discussed. Call if no better. Tylenol as needed. R.A.: Currently on no medications, has not seen Dr. Herold Harms in a while. Sxs are minimal. Recommend to see rheumatology at her convenience RTC 4 months

## 2017-01-29 MED ORDER — SYNTHROID 88 MCG PO TABS: 88.0000 ug | ORAL_TABLET | Freq: Every day | ORAL | 2 refills | Status: DC

## 2017-01-29 NOTE — Addendum Note (Signed)
Addended byDamita Dunnings D on: 01/29/2017 11:26 AM   Modules accepted: Orders

## 2017-01-31 ENCOUNTER — Emergency Department (HOSPITAL_BASED_OUTPATIENT_CLINIC_OR_DEPARTMENT_OTHER)
Admission: EM | Admit: 2017-01-31 | Discharge: 2017-01-31 | Disposition: A | Discharge status: Home / Self Care | Payer: BLUE CROSS/BLUE SHIELD | Attending: Emergency Medicine | Admitting: Emergency Medicine

## 2017-01-31 ENCOUNTER — Encounter (HOSPITAL_BASED_OUTPATIENT_CLINIC_OR_DEPARTMENT_OTHER): Payer: Self-pay | Admitting: *Deleted

## 2017-01-31 DIAGNOSIS — J45909 Unspecified asthma, uncomplicated: Secondary | ICD-10-CM | POA: Diagnosis not present

## 2017-01-31 DIAGNOSIS — E039 Hypothyroidism, unspecified: Secondary | ICD-10-CM | POA: Diagnosis not present

## 2017-01-31 DIAGNOSIS — I10 Essential (primary) hypertension: Secondary | ICD-10-CM | POA: Insufficient documentation

## 2017-01-31 DIAGNOSIS — M542 Cervicalgia: Secondary | ICD-10-CM | POA: Diagnosis not present

## 2017-01-31 DIAGNOSIS — Z79899 Other long term (current) drug therapy: Secondary | ICD-10-CM | POA: Diagnosis not present

## 2017-01-31 MED ORDER — METHOCARBAMOL 500 MG PO TABS
500.0000 mg | ORAL_TABLET | Freq: Once | ORAL | Status: AC
  Administered 2017-01-31: 500 mg via ORAL
  Filled 2017-01-31: qty 1

## 2017-01-31 MED ORDER — METHOCARBAMOL 500 MG PO TABS: 500.0000 mg | ORAL_TABLET | Freq: Two times a day (BID) | ORAL | 0 refills | Status: DC

## 2017-01-31 NOTE — ED Notes (Signed)
Pt ambulated to bathroom 

## 2017-01-31 NOTE — Discharge Instructions (Signed)
Please read attached information. If you experience any new or worsening signs or symptoms please return to the emergency room for evaluation. Please follow-up with your primary care provider or specialist as discussed. Please use medication prescribed only as directed and discontinue taking if you have any concerning signs or symptoms.   °

## 2017-01-31 NOTE — ED Triage Notes (Signed)
Pt states that she went to her PCP on Wednesday and 'they didn't say nothing'

## 2017-01-31 NOTE — ED Provider Notes (Signed)
[facility] Provider Note   CSN: [id number] Arrival date & time: 01/31/17  1821     History   Chief Complaint Chief Complaint  Patient presents with  . Neck Injury    HPI Brittany Hodges is a 51 y.o. female.  HPI    51 year old female presents today with complaints of neck pain. Patient notes seven-day history of bilateral neck pain and spasms. Patient notes yesterday pain was worse on the left, now having pain on the right. Patient notes a distant history of the same in the past, but cannot recall what treatment she received. Patient denies any trauma or overuse injuries. Patient with no infectious etiology. No loss of distal sensation and strength and motor function.    Past Medical History:  Diagnosis Date  . Allergic rhinitis, cause unspecified   . Anemia   . Asthma   . Atrial septal defect    s/p repair;   Echo 08/07/10: EF 55-60%; mild MR; atrial septal aneurysm; no residual ASD  . Chest pain, unspecified    cardiac cath 07/31/10: normal coronaries; vigorous LVF  . Esophageal reflux   . Fibromyalgia   . Headache(784.0)   . Hematuria, unspecified   . Hypertension    no meds   . Hypothyroidism   . Irritable bowel syndrome   . Low back pain   . Rheumatoid arthritis(714.0)    Dr Herold Harms  . Scoliosis   . Sinoatrial node dysfunction (HCC)    eval. for chronotropic competence completed in past    Patient Active Problem List   Diagnosis Date Noted  . Asthma 04/06/2016  . Laryngopharyngeal reflux (LPR) 04/06/2016  . Constipation 08/06/2015  . PCP NOTES >>>>> 03/09/2015  . Annual physical exam 12/21/2014  . Cardiomegaly, by MRI 03/16/2014  . Leukopenia 03/10/2014  . Lightheadedness 02/10/2011  . SINOATRIAL NODE DYSFUNCTION 08/06/2008  . ATRIAL SEPTAL DEFECT 06/29/2007  . Hypothyroidism 03/21/2007  . Essential hypertension 03/21/2007  . Allergic rhinitis 03/21/2007  . GERD-- dysphagia 03/21/2007  . CONSTIPATION 03/21/2007  . IRRITABLE  BOWEL SYNDROME 03/21/2007  . RA (rheumatoid arthritis) ([place]) 03/21/2007  . LOW BACK PAIN 03/21/2007  . FIBROMYALGIA 03/21/2007    Past Surgical History:  Procedure Laterality Date  . ASD REPAIR  1985  . TUBAL LIGATION      OB History    No data available       Home Medications    Prior to Admission medications   Medication Sig Start Date End Date Taking? Authorizing Provider  albuterol (PROAIR HFA) 108 (90 Base) MCG/ACT inhaler Inhale 2 puffs into the lungs every 6 (six) hours as needed. For shortness of breath and wheezing 04/06/16   Noralee Space, MD  amLODipine (NORVASC) 5 MG tablet Take 1 tablet (5 mg total) by mouth daily. 12/17/16   Colon Branch, MD  azelastine (ASTELIN) 0.1 % nasal spray Place 2 sprays into both nostrils at bedtime as needed for rhinitis. Use in each nostril as directed 03/08/15   Colon Branch, MD  cetirizine (ZYRTEC) 10 MG tablet TAKE ONE TABLET BY MOUTH EVERY DAY Patient not taking: Reported on 09/16/2016 06/22/13   Noralee Space, MD  Cholecalciferol (VITAMIN D) 1000 UNITS capsule Take 1,000 Units by mouth daily.      [provider]  fluticasone (FLONASE) 50 MCG/ACT nasal spray Place 2 sprays into both nostrils daily as needed. 03/09/14   [provider]  methocarbamol (ROBAXIN) 500 MG tablet Take 1 tablet (500 mg total)  by mouth 2 (two) times daily. 01/31/17   Theodosia Bahena, Dellis Filbert, PA-C  omeprazole (PRILOSEC) 40 MG capsule Take 1 capsule (40 mg total) by mouth 2 (two) times daily. Patient not taking: Reported on 01/27/2017 12/21/14   Colon Branch, MD  sodium chloride (V-R NASAL SPRAY SALINE) 0.65 % nasal spray Place into the nose. As directed    [provider]  Spacer/Aero-Holding Chambers (AEROCHAMBER MV) inhaler Use as instructed 09/15/12   Noralee Space, MD  SYNTHROID 88 MCG tablet Take 1 tablet (88 mcg total) by mouth daily before breakfast. 01/29/17   Colon Branch, MD    Family History Family History  Problem Relation Age of Onset  .  Hypertension Mother   . Heart disease Maternal Grandmother   . Breast cancer Other        aunt  . Diabetes Other        GM  . Colon cancer Neg Hx     Social History Social History  Substance Use Topics  . Smoking status: Never Smoker  . Smokeless tobacco: Never Used     Comment: never used tobacco  . Alcohol use No     Allergies   Etanercept; Methotrexate derivatives; and Pregabalin   Review of Systems Review of Systems  All other systems reviewed and are negative.    Physical Exam Updated Vital Signs BP (!) 160/95 (BP Location: Left Arm)   Pulse (!) 54   Temp 98.6 F (37 C) (Oral)   Resp 18   Ht 5\' 8"  (1.727 m)   Wt 57.2 kg (126 lb)   SpO2 100%   BMI 19.16 kg/m   Physical Exam  Constitutional: She is oriented to person, place, and time. She appears well-developed and well-nourished.  HENT:  Head: Normocephalic and atraumatic.  Eyes: Pupils are equal, round, and reactive to light. Conjunctivae are normal. Right eye exhibits no discharge. Left eye exhibits no discharge. No scleral icterus.  Neck: Normal range of motion. No JVD present. No tracheal deviation present.  Pulmonary/Chest: Effort normal. No stridor.  Musculoskeletal:  No CT or L-spine tenderness. Tenderness palpation the right lateral cervical musculature and trapezius, decreased range of motion of the neck in all directions due to discomfort  Neurological: She is alert and oriented to person, place, and time. Coordination normal.  Psychiatric: She has a normal mood and affect. Her behavior is normal. Judgment and thought content normal.  Nursing note and vitals reviewed.    ED Treatments / Results  Labs (all labs ordered are listed, but only abnormal results are displayed) Labs Reviewed - No data to display  EKG  EKG Interpretation None       Radiology No results found.  Procedures Procedures (including critical care time)  Medications Ordered in ED Medications - No data to  display   Initial Impression / Assessment and Plan / ED Course  I have reviewed the triage vital signs and the nursing notes.  Pertinent labs & imaging results that were available during my care of the patient were reviewed by me and considered in my medical decision making (see chart for details).      Final Clinical Impressions(s) / ED Diagnoses   Final diagnoses:  Neck pain    Labs:   Imaging:  Consults:  Therapeutics:  Discharge Meds:   Assessment/Plan: 51 year old female with likely muscular neck pain with question will spasms. No spasms here. She'll be treated with muscle relaxers, encouraged follow-up with her primary care return to ED  if symptoms worsen. No signs of infectious etiology on exam. No need for imaging at this time. During the end of my conversation patient called our phone called her boss interrupting our continued discussion.     New Prescriptions New Prescriptions   METHOCARBAMOL (ROBAXIN) 500 MG TABLET    Take 1 tablet (500 mg total) by mouth 2 (two) times daily.     Okey Regal, PA-C 01/31/17 2030    Gareth Morgan, MD 02/03/17 [phone]

## 2017-01-31 NOTE — ED Notes (Signed)
Provider at bedside

## 2017-01-31 NOTE — ED Triage Notes (Signed)
Pt reports neck spasms x 6 days. Reports hx of same, denies OTC pain medication PTA. Denies known injury.

## 2017-02-01 ENCOUNTER — Telehealth: Payer: Self-pay | Admitting: Rheumatology

## 2017-02-01 NOTE — Telephone Encounter (Signed)
Patient is having neck spasms since last Monday. Patient woke up with stiffness, and continued intensity since last week. Patient was wondering if she needs to come here for this issue, or some where else. Patient scheduled an appt for October to fu. Please call to advise.

## 2017-02-01 NOTE — Telephone Encounter (Signed)
Patient states about 1 week ago she woke up with the soreness in her neck. Patient states she then began to have muscles spasms in her neck. Patient states is got so bad that she went the emergency room last night and was given muscle relaxer. Patient states she will keep her follow up appointment for October 2018. Patient will call back if the medication does not help with the muscle spasms.

## 2017-02-04 ENCOUNTER — Telehealth: Payer: Self-pay | Admitting: Internal Medicine

## 2017-02-04 MED ORDER — AMLODIPINE BESYLATE 5 MG PO TABS: 5.0000 mg | ORAL_TABLET | Freq: Every day | ORAL | 1 refills | Status: DC

## 2017-02-04 MED ORDER — SYNTHROID 88 MCG PO TABS: 88.0000 ug | ORAL_TABLET | Freq: Every day | ORAL | 0 refills | Status: DC

## 2017-02-04 NOTE — Telephone Encounter (Signed)
Pt called in to request to have her 2 medications be sent to [facility]. She said that her local pharmacy has gone up on their prices. She said that [facility] suggested that provider select for pt to have generic to be charged generic pricing but pt will still receive the "brand name" medication. She said that if select "brand name" it will charge her the price difference.   SYNTHROID 88 MCG tablet and AmLODipine

## 2017-02-04 NOTE — Telephone Encounter (Signed)
Rx's sent to [facility].  

## 2017-02-19 NOTE — Progress Notes (Signed)
Office Visit Note  Patient: Brittany Hodges             Date of Birth: 07/09/1965           MRN: [id number]             PCP: Colon Branch, MD Referring: Colon Branch, MD Visit Date: 03/01/2017 Occupation: @GUAROCC @    Subjective:  Pain in cervical spine, hands and feet   History of Present Illness: Brittany Hodges is a 51 y.o. female with history of rheumatoid arthritis. Brittany Hodges states Brittany Hodges has not taken Humira for almost a year now. Brittany Hodges states that Brittany Hodges's been taking natural anti-inflammatories. Brittany Hodges reports off and on inflammation in her joints with increased activity. Brittany Hodges's been having some stiffness in her C-spine.  Activities of Daily Living:  Patient reports morning stiffness for DENIES.   Patient Denies nocturnal pain.  Difficulty dressing/grooming: Denies Difficulty climbing stairs: Denies Difficulty getting out of chair: Denies Difficulty using hands for taps, buttons, cutlery, and/or writing: Denies   Review of Systems  Constitutional: Negative for fatigue, night sweats, weight gain, weight loss and weakness.  HENT: Negative for mouth sores, trouble swallowing, trouble swallowing, mouth dryness and nose dryness.   Eyes: Negative for pain, redness, visual disturbance and dryness.  Respiratory: Negative for cough, shortness of breath and difficulty breathing.   Cardiovascular: Negative for chest pain, palpitations, hypertension, irregular heartbeat and swelling in legs/feet.  Gastrointestinal: Negative for blood in stool, constipation, diarrhea and nausea.  Endocrine: Positive for cold intolerance. Negative for increased urination.  Genitourinary: Negative for difficulty urinating and vaginal dryness.  Musculoskeletal: Positive for arthralgias, joint pain, joint swelling and muscle weakness. Negative for myalgias, morning stiffness, muscle tenderness and myalgias.  Skin: Negative for color change, rash, hair loss, redness, skin tightness, ulcers and sensitivity to  sunlight.  Allergic/Immunologic: Negative for susceptible to infections.  Neurological: Negative for dizziness, light-headedness, memory loss and night sweats.  Hematological: Negative for bruising/bleeding tendency and swollen glands.  Psychiatric/Behavioral: Negative for depressed mood and sleep disturbance. The patient is not nervous/anxious.     PMFS History:  Patient Active Problem List   Diagnosis Date Noted  . Asthma 04/06/2016  . Laryngopharyngeal reflux (LPR) 04/06/2016  . Constipation 08/06/2015  . PCP NOTES >>>>> 03/09/2015  . Annual physical exam 12/21/2014  . Cardiomegaly, by MRI 03/16/2014  . Leukopenia 03/10/2014  . Lightheadedness 02/10/2011  . SINOATRIAL NODE DYSFUNCTION 08/06/2008  . ATRIAL SEPTAL DEFECT 06/29/2007  . Hypothyroidism 03/21/2007  . Essential hypertension 03/21/2007  . Allergic rhinitis 03/21/2007  . GERD-- dysphagia 03/21/2007  . CONSTIPATION 03/21/2007  . IRRITABLE BOWEL SYNDROME 03/21/2007  . RA (rheumatoid arthritis) ([place]) 03/21/2007  . LOW BACK PAIN 03/21/2007  . FIBROMYALGIA 03/21/2007    Past Medical History:  Diagnosis Date  . Allergic rhinitis, cause unspecified   . Anemia   . Asthma   . Atrial septal defect    s/p repair;   Echo 08/07/10: EF 55-60%; mild MR; atrial septal aneurysm; no residual ASD  . Chest pain, unspecified    cardiac cath 07/31/10: normal coronaries; vigorous LVF  . Esophageal reflux   . Fibromyalgia   . Headache(784.0)   . Hematuria, unspecified   . Hypertension    no meds   . Hypothyroidism   . Irritable bowel syndrome   . Low back pain   . Rheumatoid arthritis(714.0)    Dr Herold Harms  . Scoliosis   . Sinoatrial node dysfunction (HCC)    eval.  for chronotropic competence completed in past    Family History  Problem Relation Age of Onset  . Hypertension Mother   . Heart disease Maternal Grandmother   . Breast cancer Other        aunt  . Diabetes Other        GM  . Colon cancer Neg Hx    Past  Surgical History:  Procedure Laterality Date  . ASD REPAIR  1985  . TONSILLECTOMY AND ADENOIDECTOMY    . TUBAL LIGATION     Social History   Social History Narrative   Household-- pt and youngest son      Objective: Vital Signs: BP 118/66 (BP Location: Left Arm, Patient Position: Sitting, Cuff Size: Normal)   Pulse (!) 50   Resp 14   Ht 5' 8.5" (1.74 m)   Wt 129 lb (58.5 kg)   LMP 05/01/2016   BMI 19.33 kg/m    Physical Exam  Constitutional: Brittany Hodges is oriented to person, place, and time. Brittany Hodges appears well-developed and well-nourished.  HENT:  Head: Normocephalic and atraumatic.  Eyes: Conjunctivae and EOM are normal.  Neck: Normal range of motion.  Cardiovascular: Normal rate, regular rhythm, normal heart sounds and intact distal pulses.   Pulmonary/Chest: Effort normal and breath sounds normal.  Abdominal: Soft. Bowel sounds are normal.  Lymphadenopathy:    Brittany Hodges has no cervical adenopathy.  Neurological: Brittany Hodges is alert and oriented to person, place, and time.  Skin: Skin is warm and dry. Capillary refill takes less than 2 seconds.  Psychiatric: Brittany Hodges has a normal mood and affect. Her behavior is normal.  Nursing note and vitals reviewed.    Musculoskeletal Exam: C-spine limited range of motion with the stiffness. Shoulder joints elbow joints with good range of motion. Brittany Hodges is tenderness on palpation over right wrist joint without any synovitis. Brittany Hodges also had tenderness over right first MCP joint. All other MCPs PIPs DIPs with good range of motion with no synovitis. Hip joints knee joints ankles MTPs PIPs DIPs with good range of motion with no synovitis. Brittany Hodges has some tenderness across her MTPs.  CDAI Exam: CDAI Homunculus Exam:   Tenderness:  RUE: wrist Right hand: 1st MCP  Joint Counts:  CDAI Tender Joint count: 2 CDAI Swollen Joint count: 0  Global Assessments:  Patient Global Assessment: 4 Provider Global Assessment: 2  CDAI Calculated Score:  8    Investigation: Findings:   09/09/2015 x-rays of the C-spine, 2 views, today.  Brittany Hodges has multilevel spondylosis with C4-5 and C5-6 severe narrowing with anterior osteophytes.   Bilateral hand x-rays, 2 views, showed minimal PIP narrowing, no MCP joint changes, and no erosive changes were noted.   Bilateral feet x-rays showed minimal PIP and DIP narrowing without any erosive changes.  Negative TB gold 09/09/15   CBC Latest Ref Rng & Units 01/27/2017 04/15/2016 07/26/2013  WBC 4.0 - 10.5 K/uL 2.6(L) 3.9 2.8(L)  Hemoglobin 12.0 - 15.0 g/dL 12.7 12.4 11.2(L)  Hematocrit 36.0 - 46.0 % 40.4 38.3 35.5  Platelets 150.0 - 400.0 K/uL 138.0(L) 189 199   CMP Latest Ref Rng & Units 01/27/2017 04/15/2016 03/20/2013  Glucose 70 - 99 mg/dL 82 101(H) 81  BUN 6 - 23 mg/dL 11 13 11   Creatinine 0.40 - 1.20 mg/dL 0.79 0.90 0.8  Sodium 135 - 145 mEq/L 139 140 138  Potassium 3.5 - 5.1 mEq/L 3.9 4.3 4.0  Chloride 96 - 112 mEq/L 103 102 109  CO2 19 - 32 mEq/L 29 22 23  Calcium 8.4 - 10.5 mg/dL 10.7(H) 9.8 9.7  Total Protein 6.0 - 8.3 g/dL 8.7(H) 8.4 8.1  Total Bilirubin 0.2 - 1.2 mg/dL 0.5 0.2 0.8  Alkaline Phos 39 - 117 U/L 65 62 43  AST 0 - 37 U/L 30 26 29   ALT 0 - 35 U/L 20 16 16      Imaging: Xr Cervical Spine 2 Or 3 Views  Result Date: 03/01/2017 Brittany Hodges has loss of cervical lordosis. Brittany Hodges has severe narrowing of C3-4, C4-5, C5-6. Most most severe at C5-6 level. Anterior spurring was noted.  Xr Foot 2 Views Left  Result Date: 03/01/2017 First MTP, PIP DIP narrowing was noted. None of the other MTP showed narrowing or erosive changes. Impression: These findings are consistent with mild osteoarthritis of the foot.  Xr Foot 2 Views Right  Result Date: 03/01/2017 First MTP, PIP DIP narrowing was noted. None of the other MTP showed narrowing or erosive changes. Impression: These findings are consistent with mild osteoarthritis of the foot.  Xr Hand 2 View Left  Result Date: 03/01/2017 Minimal  PIP/DIP narrowing was noted. No MCP or intercarpal or radiocarpal joint space narrowing was noted. No erosive changes were noted.  Xr Hand 2 View Right  Result Date: 03/01/2017 No MCP, intercarpal, radiocarpal joint space narrowing or erosive changes were noted. Mild PIP/DIP narrowing was noted.   Speciality Comments: No specialty comments available.    Procedures:  No procedures performed Allergies: Etanercept; Methotrexate derivatives; and Pregabalin   Assessment / Plan:     Visit Diagnoses: Rheumatoid arthritis involving multiple sites with positive rheumatoid factor +CCP ([place]): Brittany Hodges has no synovitis on examination today Brittany Hodges states Brittany Hodges has intermittent swelling. Her x-rays are unremarkable.I do not see a need to start her on immunosuppressive agents at this time.  High risk medication use - patient has been off Humira for 1 year now.Brittany Hodges developed low WBC with Methotrexate, and injection site reaction to Enbrel in the past.  Pain in both hands - Plan: XR Hand 2 View Right, XR Hand 2 View Left. X-rays were unremarkable.  Pain in both feet - Plan: XR Foot 2 Views Right, XR Foot 2 Views Left. X-ray showed mild osteoarthritic changes in the feet.  DDD (degenerative disc disease), cervical - Plan: XR Cervical Spine 2 or 3 views: Brittany Hodges has severe disc disease and discomfort. Brittany Hodges also has nocturnal pain in her C-spine. I will schedule MRI of her C-spine.I will contact her after the MRI results.  Primary osteoarthritis of both hands: Mild joint protection and muscle strengthening discussed.  Primary osteoarthritis of both feet: Proper fitting shoes were discussed.  History of scoliosis  Other neutropenia ([place]): Brittany Hodges has chronic neutropenia and homicide opinion.  Other medical problems are listed as follows:  History of hypertension: Blood pressure is well controlled.  History of hypothyroidism  History of migraine  History of bradycardia/ sinoatrial node dysfunction   History of  gastroesophageal reflux (GERD)  History of IBS    Orders: Orders Placed This Encounter  Procedures  . XR Hand 2 View Right  . XR Hand 2 View Left  . XR Foot 2 Views Right  . XR Foot 2 Views Left  . XR Cervical Spine 2 or 3 views  . MR CERVICAL SPINE WO CONTRAST   No orders of the defined types were placed in this encounter.   Face-to-face time spent with patient was 30 minutes.greater than 50% of time was spent in counseling and coordination of care.  Follow-Up Instructions: Return  for Rheumatoid arthritis DDD.   Bo Merino, MD  Note - This record has been created using Editor, commissioning.  Chart creation errors have been sought, but may not always  have been located. Such creation errors do not reflect on  the standard of medical care.

## 2017-03-01 ENCOUNTER — Ambulatory Visit (INDEPENDENT_AMBULATORY_CARE_PROVIDER_SITE_OTHER): Payer: BLUE CROSS/BLUE SHIELD

## 2017-03-01 ENCOUNTER — Ambulatory Visit (INDEPENDENT_AMBULATORY_CARE_PROVIDER_SITE_OTHER): Payer: Self-pay

## 2017-03-01 ENCOUNTER — Encounter: Payer: Self-pay | Admitting: Rheumatology

## 2017-03-01 ENCOUNTER — Ambulatory Visit (INDEPENDENT_AMBULATORY_CARE_PROVIDER_SITE_OTHER): Payer: BLUE CROSS/BLUE SHIELD | Admitting: Rheumatology

## 2017-03-01 VITALS — BP 118/66 | HR 50 | Resp 14 | Ht 68.5 in | Wt 129.0 lb

## 2017-03-01 DIAGNOSIS — M79642 Pain in left hand: Secondary | ICD-10-CM

## 2017-03-01 DIAGNOSIS — Z79899 Other long term (current) drug therapy: Secondary | ICD-10-CM

## 2017-03-01 DIAGNOSIS — M5030 Other cervical disc degeneration, unspecified cervical region: Secondary | ICD-10-CM

## 2017-03-01 DIAGNOSIS — M79672 Pain in left foot: Secondary | ICD-10-CM | POA: Diagnosis not present

## 2017-03-01 DIAGNOSIS — M79641 Pain in right hand: Secondary | ICD-10-CM

## 2017-03-01 DIAGNOSIS — M19041 Primary osteoarthritis, right hand: Secondary | ICD-10-CM

## 2017-03-01 DIAGNOSIS — Z8679 Personal history of other diseases of the circulatory system: Secondary | ICD-10-CM

## 2017-03-01 DIAGNOSIS — Z8639 Personal history of other endocrine, nutritional and metabolic disease: Secondary | ICD-10-CM | POA: Diagnosis not present

## 2017-03-01 DIAGNOSIS — Z8719 Personal history of other diseases of the digestive system: Secondary | ICD-10-CM

## 2017-03-01 DIAGNOSIS — Z8739 Personal history of other diseases of the musculoskeletal system and connective tissue: Secondary | ICD-10-CM | POA: Diagnosis not present

## 2017-03-01 DIAGNOSIS — D708 Other neutropenia: Secondary | ICD-10-CM | POA: Diagnosis not present

## 2017-03-01 DIAGNOSIS — M79671 Pain in right foot: Secondary | ICD-10-CM | POA: Diagnosis not present

## 2017-03-01 DIAGNOSIS — M19072 Primary osteoarthritis, left ankle and foot: Secondary | ICD-10-CM

## 2017-03-01 DIAGNOSIS — M19071 Primary osteoarthritis, right ankle and foot: Secondary | ICD-10-CM | POA: Diagnosis not present

## 2017-03-01 DIAGNOSIS — Z8669 Personal history of other diseases of the nervous system and sense organs: Secondary | ICD-10-CM

## 2017-03-01 DIAGNOSIS — M0579 Rheumatoid arthritis with rheumatoid factor of multiple sites without organ or systems involvement: Secondary | ICD-10-CM

## 2017-03-01 DIAGNOSIS — M19042 Primary osteoarthritis, left hand: Secondary | ICD-10-CM

## 2017-03-01 DIAGNOSIS — Z87898 Personal history of other specified conditions: Secondary | ICD-10-CM

## 2017-03-05 ENCOUNTER — Telehealth (INDEPENDENT_AMBULATORY_CARE_PROVIDER_SITE_OTHER): Payer: Self-pay | Admitting: *Deleted

## 2017-03-05 NOTE — Telephone Encounter (Signed)
Pt has appt scheduled for MRI on Monday Oct 22 at [facility] [place]. [address] at 5p, pt is to arrive 15 mins early to register. I C left vm for pt to return call for appt information.

## 2017-03-05 NOTE — Telephone Encounter (Signed)
Pt called and left message to return her call I C pt back to give her appt information and she replied hospital had already contacted her to advise her of an appt.

## 2017-03-08 ENCOUNTER — Ambulatory Visit (HOSPITAL_COMMUNITY)
Admission: RE | Admit: 2017-03-08 | Discharge: 2017-03-08 | Disposition: A | Discharge status: Home / Self Care | Payer: BLUE CROSS/BLUE SHIELD | Source: Ambulatory Visit | Attending: Rheumatology | Admitting: Rheumatology

## 2017-03-08 DIAGNOSIS — M50322 Other cervical disc degeneration at C5-C6 level: Secondary | ICD-10-CM | POA: Insufficient documentation

## 2017-03-08 DIAGNOSIS — R609 Edema, unspecified: Secondary | ICD-10-CM | POA: Diagnosis not present

## 2017-03-08 DIAGNOSIS — M5030 Other cervical disc degeneration, unspecified cervical region: Secondary | ICD-10-CM

## 2017-03-09 ENCOUNTER — Telehealth: Payer: Self-pay | Admitting: *Deleted

## 2017-03-09 DIAGNOSIS — M542 Cervicalgia: Secondary | ICD-10-CM

## 2017-03-09 NOTE — Progress Notes (Signed)
Please discuss MRI results with patient. She may and affect from seeing Dr. Louanne Skye

## 2017-03-09 NOTE — Telephone Encounter (Signed)
-----   Message from Bo Merino, MD sent at 03/09/2017  1:00 PM EDT ----- Please discuss MRI results with patient. She may and affect from seeing Dr. Louanne Skye

## 2017-03-22 ENCOUNTER — Other Ambulatory Visit (INDEPENDENT_AMBULATORY_CARE_PROVIDER_SITE_OTHER): Payer: BLUE CROSS/BLUE SHIELD

## 2017-03-22 DIAGNOSIS — E039 Hypothyroidism, unspecified: Secondary | ICD-10-CM

## 2017-03-22 LAB — TSH: TSH: 15.94 u[IU]/mL — ABNORMAL HIGH (ref 0.35–4.50)

## 2017-03-24 ENCOUNTER — Telehealth: Payer: Self-pay | Admitting: Internal Medicine

## 2017-03-24 MED ORDER — LEVOTHYROXINE SODIUM 88 MCG PO TABS: 88.0000 ug | ORAL_TABLET | Freq: Every day | ORAL | 0 refills | Status: DC

## 2017-03-24 NOTE — Addendum Note (Signed)
Addended byDamita Dunnings D on: 03/24/2017 09:18 AM   Modules accepted: Orders

## 2017-03-24 NOTE — Telephone Encounter (Signed)
Rx resent.

## 2017-03-24 NOTE — Telephone Encounter (Signed)
Per pt ph call: [facility] canceled script due to it said brand name on it-Brand name only. They cannot charge generic price if paz writes brand only. ($60 brand name). Brittany Hodges wants her to take brand name but pharmacy will give it to her for generic price if it written correctly.

## 2017-03-26 ENCOUNTER — Other Ambulatory Visit: Payer: Self-pay

## 2017-03-26 MED ORDER — LEVOTHYROXINE SODIUM 88 MCG PO TABS: 88.0000 ug | ORAL_TABLET | Freq: Every day | ORAL | 0 refills | Status: DC

## 2017-03-26 NOTE — Telephone Encounter (Signed)
Pt called in because she said that Rx was sent in but it is still written incorrectly. Verbalized what pt need to assistant, she will forward to provider to make him aware. Assistant will resent Rx per pt.

## 2017-04-15 ENCOUNTER — Encounter (INDEPENDENT_AMBULATORY_CARE_PROVIDER_SITE_OTHER): Payer: Self-pay | Admitting: Specialist

## 2017-04-15 ENCOUNTER — Ambulatory Visit (INDEPENDENT_AMBULATORY_CARE_PROVIDER_SITE_OTHER): Payer: Self-pay

## 2017-04-15 ENCOUNTER — Ambulatory Visit (INDEPENDENT_AMBULATORY_CARE_PROVIDER_SITE_OTHER): Payer: BLUE CROSS/BLUE SHIELD | Admitting: Specialist

## 2017-04-15 VITALS — BP 143/88 | HR 45 | Ht 68.0 in | Wt 128.0 lb

## 2017-04-15 DIAGNOSIS — M47812 Spondylosis without myelopathy or radiculopathy, cervical region: Secondary | ICD-10-CM

## 2017-04-15 DIAGNOSIS — M4802 Spinal stenosis, cervical region: Secondary | ICD-10-CM

## 2017-04-15 DIAGNOSIS — M542 Cervicalgia: Secondary | ICD-10-CM | POA: Diagnosis not present

## 2017-04-15 MED ORDER — MELOXICAM 7.5 MG PO TABS: 7.5000 mg | ORAL_TABLET | Freq: Every day | ORAL | 5 refills | Status: DC

## 2017-04-15 NOTE — Progress Notes (Signed)
Office Visit Note   Patient: Brittany Hodges           Date of Birth: 1966-02-02           MRN: [id number] Visit Date: 04/15/2017              Requested by: Colon Branch, [address] [place], [place] [address] PCP: Colon Branch, MD   Assessment & Plan: Visit Diagnoses:  1. Spondylosis without myelopathy or radiculopathy, cervical region   2. Cervicalgia   3. Spinal stenosis of cervical region   51 year old right handed female with several month history of primary neck and shoulder pain, no gait disturbance, and no upper extremity radicular symptoms. Pain limits flexion and extension. Clinically no focal motor or sensory deficity, no long track findings. She has recent MRI showing mild spinal stenosis  At 8.9 to 9.2 mm AP central canal measurements. Normal is 10 there is no signal change. Recommend conservative management with NSAIDs and a physical therapy program. Continue to follow for the cervical stenosis.   Plan:Avoid overhead lifting and overhead use of the arms. Do not lift greater than 5 lbs. Adjust head rest in vehicle to prevent hyperextension if rear ended. Take extra precautions to avoid falling, including use of a cane if you feel weak. Referral to physical therapy at [facility].   Follow-Up Instructions: Return in about 6 years (around 04/16/2023).   Orders:  Orders Placed This Encounter  Procedures  . XR Cervical Spine 2 or 3 views  . Ambulatory referral to Physical Therapy   Meds ordered this encounter  Medications  . meloxicam (MOBIC) 7.5 MG tablet    Sig: Take 1 tablet (7.5 mg total) by mouth daily.    Dispense:  60 tablet    Refill:  5      Procedures: No procedures performed   Clinical Data: No additional findings.   Subjective: Chief Complaint  Patient presents with  . Neck - Pain    51 year old female with history of RA, has been using tumeric, ginger, and tart of cherry and Fovic mineral to help with her arthritis. Was  seen by Dr. Estanislado Pandy and evaluated and referred for evaluation of her neck pain and spasm. Pain is mainly in the mid cervical spine posteriorly and radiates downwards. She has pain with bending forward and side to side rotation. No arm numbness or pain. It is mainly in her neck and shoulds bilateral outer collar bone and upper shoulders. Cough or sneeze is painful only when the cervical pain is present. It began in [place], she noticed the pain quite a bit before she went on the cruise. She is able to walk okay, no bowel or bladder difficulties. She does not feel weak except when trying to open something or jars, she has a hard time getting a good grip. No increase in clumbsiness or dropping of items. Has difficulty getting comfortable at night but once she is comfortable she can get to sleep. Only when the neck is bothering her does she notice pain with driving.She sees Dr. Jens Som for bradycardia and is not on any medication.      Review of Systems  Constitutional: Negative.   HENT: Negative.   Eyes: Negative.   Respiratory: Negative.   Cardiovascular: Negative.   Gastrointestinal: Negative.   Endocrine: Negative.   Genitourinary: Negative.   Musculoskeletal: Negative.   Skin: Negative.   Allergic/Immunologic: Negative.   Neurological: Negative.  Hematological: Negative.   Psychiatric/Behavioral: Negative.      Objective: Vital Signs: BP (!) 143/88 (BP Location: Left Arm, Patient Position: Sitting)   Pulse (!) 45   Ht 5\' 8"  (1.727 m)   Wt 128 lb (58.1 kg)   BMI 19.46 kg/m   Physical Exam  Constitutional: She is oriented to person, place, and time. She appears well-developed and well-nourished.  HENT:  Head: Normocephalic and atraumatic.  Eyes: EOM are normal. Pupils are equal, round, and reactive to light.  Neck: Normal range of motion. Neck supple.  Pulmonary/Chest: Effort normal and breath sounds normal.  Abdominal: Soft. Bowel sounds are normal.  Musculoskeletal:  Normal range of motion.  Neurological: She is alert and oriented to person, place, and time.  Skin: Skin is warm and dry.  Psychiatric: She has a normal mood and affect. Her behavior is normal. Judgment and thought content normal.    Ortho Exam  Specialty Comments:  No specialty comments available.  Imaging: No results found.   PMFS History: Patient Active Problem List   Diagnosis Date Noted  . Asthma 04/06/2016  . Laryngopharyngeal reflux (LPR) 04/06/2016  . Constipation 08/06/2015  . PCP NOTES >>>>> 03/09/2015  . Annual physical exam 12/21/2014  . Cardiomegaly, by MRI 03/16/2014  . Leukopenia 03/10/2014  . Lightheadedness 02/10/2011  . SINOATRIAL NODE DYSFUNCTION 08/06/2008  . ATRIAL SEPTAL DEFECT 06/29/2007  . Hypothyroidism 03/21/2007  . Essential hypertension 03/21/2007  . Allergic rhinitis 03/21/2007  . GERD-- dysphagia 03/21/2007  . CONSTIPATION 03/21/2007  . IRRITABLE BOWEL SYNDROME 03/21/2007  . RA (rheumatoid arthritis) ([place]) 03/21/2007  . LOW BACK PAIN 03/21/2007  . FIBROMYALGIA 03/21/2007   Past Medical History:  Diagnosis Date  . Allergic rhinitis, cause unspecified   . Anemia   . Asthma   . Atrial septal defect    s/p repair;   Echo 08/07/10: EF 55-60%; mild MR; atrial septal aneurysm; no residual ASD  . Chest pain, unspecified    cardiac cath 07/31/10: normal coronaries; vigorous LVF  . Esophageal reflux   . Fibromyalgia   . Headache(784.0)   . Hematuria, unspecified   . Hypertension    no meds   . Hypothyroidism   . Irritable bowel syndrome   . Low back pain   . Rheumatoid arthritis(714.0)    Dr Herold Harms  . Scoliosis   . Sinoatrial node dysfunction (HCC)    eval. for chronotropic competence completed in past    Family History  Problem Relation Age of Onset  . Hypertension Mother   . Heart disease Maternal Grandmother   . Breast cancer Other        aunt  . Diabetes Other        GM  . Colon cancer Neg Hx     Past Surgical History:    Procedure Laterality Date  . ASD REPAIR  1985  . TONSILLECTOMY AND ADENOIDECTOMY    . TUBAL LIGATION     Social History   Occupational History  . Occupation: Retail buyer: [employer]  Tobacco Use  . Smoking status: Never Smoker  . Smokeless tobacco: Never Used  . Tobacco comment: never used tobacco  Substance and Sexual Activity  . Alcohol use: No  . Drug use: No  . Sexual activity: Not Currently

## 2017-04-15 NOTE — Patient Instructions (Signed)
Avoid overhead lifting and overhead use of the arms. Do not lift greater than 5 lbs. Adjust head rest in vehicle to prevent hyperextension if rear ended. Take extra precautions to avoid falling, including use of a cane if you feel weak. Referral to physical therapy at [facility].

## 2017-04-16 ENCOUNTER — Telehealth (INDEPENDENT_AMBULATORY_CARE_PROVIDER_SITE_OTHER): Payer: Self-pay | Admitting: Specialist

## 2017-04-16 NOTE — Telephone Encounter (Signed)
Patient called asking for Dr. Arvil Persons advice since she was in the office he put her on some restrictions for work and her job requires her to lift objects over 5 pounds, reach over her head etc. She is going to be going to PT but she was wondering if he would just suggest her go on medical leave? If you could just give her a call back at [phone] and you can leave a message.

## 2017-04-16 NOTE — Telephone Encounter (Signed)
Patient called asking for Dr. Arvil Persons advice since she was in the office he put her on some restrictions for work and her job requires her to lift objects over 5 pounds, reach over her head etc. She is going to be going to PT but she was wondering if he would just suggest her go on medical leave? If you could just give her a call back at [phone] and you can leave a message.

## 2017-04-23 NOTE — Telephone Encounter (Signed)
Please advise on how long patient will be under current restrictions. Thanks.

## 2017-04-23 NOTE — Telephone Encounter (Signed)
Patient was never updated on how long she would have restrictions, please call back [phone]

## 2017-04-27 NOTE — Telephone Encounter (Signed)
She needs to restrict her lifting for at least 4-6 weeks. Would want to demonstrate capacity to lift greater with therapy.

## 2017-04-28 NOTE — Telephone Encounter (Signed)
I called and lmom and advised of Dr. Otho Ket response

## 2017-05-27 ENCOUNTER — Ambulatory Visit (INDEPENDENT_AMBULATORY_CARE_PROVIDER_SITE_OTHER): Payer: BLUE CROSS/BLUE SHIELD | Admitting: Specialist

## 2017-06-02 ENCOUNTER — Other Ambulatory Visit: Payer: Self-pay | Admitting: Internal Medicine

## 2017-06-02 ENCOUNTER — Encounter: Payer: Self-pay | Admitting: Internal Medicine

## 2017-06-02 ENCOUNTER — Ambulatory Visit: Payer: BLUE CROSS/BLUE SHIELD | Admitting: Internal Medicine

## 2017-06-02 VITALS — BP 124/68 | HR 48 | Temp 98.2°F | Resp 14 | Ht 68.0 in | Wt 132.1 lb

## 2017-06-02 DIAGNOSIS — E039 Hypothyroidism, unspecified: Secondary | ICD-10-CM

## 2017-06-02 DIAGNOSIS — R634 Abnormal weight loss: Secondary | ICD-10-CM | POA: Diagnosis not present

## 2017-06-02 NOTE — Progress Notes (Signed)
Subjective:    Patient ID: Brittany Hodges, female    DOB: 04/23/1966, 52 y.o.   MRN: [id number]  DOS:  06/02/2017 Type of visit - description : rov Interval history: Since the last office visit, she decided to stop Synthroid. States she is feeling great, not feeling tired.  Diet has changed significantly, eating healthy, taking lots of natural supplements like turmeric.  Wt Readings from Last 3 Encounters:  06/02/17 132 lb 2 oz (59.9 kg)  04/15/17 128 lb (58.1 kg)  03/08/17 127 lb (57.6 kg)     Review of Systems Has not lost weight anymore, actually has gained some weight. Aches and pains are really mild, follow-up by rheumatology, note reviewed.  Past Medical History:  Diagnosis Date  . Allergic rhinitis, cause unspecified   . Anemia   . Asthma   . Atrial septal defect    s/p repair;   Echo 08/07/10: EF 55-60%; mild MR; atrial septal aneurysm; no residual ASD  . Chest pain, unspecified    cardiac cath 07/31/10: normal coronaries; vigorous LVF  . Esophageal reflux   . Fibromyalgia   . Headache(784.0)   . Hematuria, unspecified   . Hypertension    no meds   . Hypothyroidism   . Irritable bowel syndrome   . Low back pain   . Rheumatoid arthritis(714.0)    Dr Herold Harms  . Scoliosis   . Sinoatrial node dysfunction (HCC)    eval. for chronotropic competence completed in past    Past Surgical History:  Procedure Laterality Date  . ASD REPAIR  1985  . TONSILLECTOMY AND ADENOIDECTOMY    . TUBAL LIGATION      Social History   Socioeconomic History  . Marital status: Single    Spouse name: Not on file  . Number of children: 3  . Years of education: Not on file  . Highest education level: Not on file  Social Needs  . Financial resource strain: Not on file  . Food insecurity - worry: Not on file  . Food insecurity - inability: Not on file  . Transportation needs - medical: Not on file  . Transportation needs - non-medical: Not on file  Occupational History    . Occupation: Retail buyer: [employer]  Tobacco Use  . Smoking status: Never Smoker  . Smokeless tobacco: Never Used  . Tobacco comment: never used tobacco  Substance and Sexual Activity  . Alcohol use: No  . Drug use: No  . Sexual activity: Not Currently  Other Topics Concern  . Not on file  Social History Narrative   Household-- pt and youngest son       Allergies as of 06/02/2017      Reactions   Etanercept    Methotrexate Derivatives    Caused her WBC to drop   Pregabalin       Medication List        Accurate as of 06/02/17 11:59 PM. Always use your most recent med list.          AEROCHAMBER MV inhaler Use as instructed   albuterol 108 (90 Base) MCG/ACT inhaler Commonly known as:  PROAIR HFA Inhale 2 puffs into the lungs every 6 (six) hours as needed. For shortness of breath and wheezing   amLODipine 5 MG tablet Commonly known as:  NORVASC Take 1 tablet (5 mg total) by mouth daily.   azelastine 0.1 % nasal spray Commonly known as:  ASTELIN Place 2 sprays into  both nostrils at bedtime as needed for rhinitis. Use in each nostril as directed   fluticasone 50 MCG/ACT nasal spray Commonly known as:  FLONASE Place 2 sprays into both nostrils daily as needed.   GINGER ROOT PO Take by mouth.   meloxicam 7.5 MG tablet Commonly known as:  MOBIC Take 1 tablet (7.5 mg total) by mouth daily.   methocarbamol 500 MG tablet Commonly known as:  ROBAXIN Take 1 tablet (500 mg total) by mouth 2 (two) times daily.   omeprazole 40 MG capsule Commonly known as:  PRILOSEC Take 1 capsule (40 mg total) by mouth 2 (two) times daily.   TART CHERRY ADVANCED PO Take by mouth.   TURMERIC PO Take by mouth.   V-R NASAL SPRAY SALINE 0.65 % nasal spray Generic drug:  sodium chloride Place into the nose. As directed   Vitamin D 1000 units capsule Take 1,000 Units by mouth daily.          Objective:   Physical Exam BP 124/68 (BP  Location: Left Arm, Patient Position: Sitting, Cuff Size: Small)   Pulse (!) 48   Temp 98.2 F (36.8 C) (Oral)   Resp 14   Ht 5\' 8"  (1.727 m)   Wt 132 lb 2 oz (59.9 kg)   SpO2 96%   BMI 20.09 kg/m  General:   Well developed, well nourished . NAD.  HEENT:  Normocephalic . Face symmetric, atraumatic Lungs:  CTA B Normal respiratory effort, no intercostal retractions, no accessory muscle use. Heart: RRR,  no murmur.  No pretibial edema bilaterally  Skin: Not pale. Not jaundice Neurologic:  alert & oriented X3.  Speech normal, gait appropriate for age and unassisted Psych--  Cognition and judgment appear intact.  Cooperative with normal attention span and concentration.  Behavior appropriate. No anxious or depressed appearing.      Assessment & Plan:   Assessment > HTN Hypothyroidism GERD- dysphagia -Dr. Norville Haggard EGD from 04/02/2014 (Dr Delrae Alfred): Narrowing of proximal esophagus, question of extrinsic extrinsic compression, EUS showing no intrinsic esophageal mass but the spine was seen in close proximity to the esophagus probably resulting in extrinsic compression. The endoscopist recommend repeat CT neck and chest in 3 months.saw ENT 04-2015 for dysphagia,had a scope,  rx PPI Asthma, uses alb rarely  CV: --Atrial septal defect: Status post repair, no residual ASD --Sinoatrial  Dysfunction, sees Dr Caryl Comes   --CP: cath 2012, normal coronaries MSK: --Rheumatoid arthritis,   Dr. Herold Harms + rheumatoid factor +CCP , has use  Humira before, h/o  low WBC with Methotrexate, and injection site reaction to Enbrel   --Fibromyalgia IBS (Miralax prn constipation)   Admission: syncope 04-2016 (unlikely to be arrhythmia related per cards )  PLAN:  Hypothyroidism: Since the last time I saw her, she decided not to take Synthroid, a follow-up TSH was elevated due to noncompliance.  States she feels great, I advised patient that despite how well she feels, she does have a thyroid condition  that need treatment, she already call endocrinology and has an upcoming appointment.  Will let them decide further steps. Weight loss: No further an issue. RA: Note from rheumatology review it, she is actually doing well on no specific medication except meloxicam and Robaxin as needed. RTC 6 months CPX

## 2017-06-02 NOTE — Patient Instructions (Signed)
  GO TO THE FRONT DESK Schedule your next appointment for a  Physical exam in 6 months  

## 2017-06-02 NOTE — Progress Notes (Signed)
Pre visit review using our clinic review tool, if applicable. No additional management support is needed unless otherwise documented below in the visit note. 

## 2017-06-03 NOTE — Assessment & Plan Note (Signed)
Hypothyroidism: Since the last time I saw her, she decided not to take Synthroid, a follow-up TSH was elevated due to noncompliance.  States she feels great, I advised patient that despite how well she feels, she does have a thyroid condition that need treatment, she already call endocrinology and has an upcoming appointment.  Will let them decide further steps. Weight loss: No further an issue. RA: Note from rheumatology review it, she is actually doing well on no specific medication except meloxicam and Robaxin as needed. RTC 6 months CPX

## 2017-06-07 ENCOUNTER — Other Ambulatory Visit: Payer: Self-pay

## 2017-06-07 ENCOUNTER — Ambulatory Visit: Payer: BLUE CROSS/BLUE SHIELD | Attending: Specialist | Admitting: Physical Therapy

## 2017-06-07 ENCOUNTER — Encounter: Payer: Self-pay | Admitting: Physical Therapy

## 2017-06-07 DIAGNOSIS — R252 Cramp and spasm: Secondary | ICD-10-CM | POA: Diagnosis present

## 2017-06-07 DIAGNOSIS — M542 Cervicalgia: Secondary | ICD-10-CM | POA: Diagnosis present

## 2017-06-07 DIAGNOSIS — R293 Abnormal posture: Secondary | ICD-10-CM | POA: Diagnosis present

## 2017-06-07 NOTE — Therapy (Signed)
[facility] [address]  [place], [place], [address] Phone: [phone]   Fax:  [phone]  Physical Therapy Evaluation  Patient Details  Name: Brittany Hodges MRN: [id number] Date of Birth: 30-Dec-1965 Referring Provider: Jessy Oto, MD   Encounter Date: 06/07/2017  PT End of Session - 06/07/17 1608    Visit Number  1    Number of Visits  12    Date for PT Re-Evaluation  07/23/17    Authorization Type  BCBS    Authorization - Number of Visits  30    PT Start Time  1608    PT Stop Time  1659    PT Time Calculation (min)  51 min    Activity Tolerance  Patient tolerated treatment well    Behavior During Therapy  [facility] for tasks assessed/performed       Past Medical History:  Diagnosis Date  . Allergic rhinitis, cause unspecified   . Anemia   . Asthma   . Atrial septal defect    s/p repair;   Echo 08/07/10: EF 55-60%; mild MR; atrial septal aneurysm; no residual ASD  . Chest pain, unspecified    cardiac cath 07/31/10: normal coronaries; vigorous LVF  . Esophageal reflux   . Fibromyalgia   . Headache(784.0)   . Hematuria, unspecified   . Hypertension    no meds   . Hypothyroidism   . Irritable bowel syndrome   . Low back pain   . Rheumatoid arthritis(714.0)    Dr Herold Harms  . Scoliosis   . Sinoatrial node dysfunction (HCC)    eval. for chronotropic competence completed in past    Past Surgical History:  Procedure Laterality Date  . ASD REPAIR  1985  . TONSILLECTOMY AND ADENOIDECTOMY    . TUBAL LIGATION      There were no vitals filed for this visit.   Subjective Assessment - 06/07/17 1612    Subjective  Pt reports she has been having neck pain for >1 yr. Noted issues with swallowing anD MRI found multi-level spondlyosis. Denies radicular pain, numbness or tingling.    Diagnostic tests  03/08/17 Cervical MRI:  Straightening and kyphotic curvature of the cervical spine. Degenerative disc  disease from C3-4 through C5-6. At the C3-4 level, there is edema within the endplate marrow, finding that can be associated with neck pain. There is narrowing of the ventral subarachnoid space at C3-4, C4-5 and C5-6, but no cord compression. There is no significant foraminal narrowing noted in the region.    Patient Stated Goals  "relieve some of the pain"    Currently in Pain?  Yes    Pain Score  6     Pain Location  Neck    Pain Orientation  Lower    Pain Descriptors / Indicators  Aching    Pain Type  Chronic pain    Pain Radiating Towards  B upper shoulders    Pain Onset  More than a month ago    Aggravating Factors   looking up or turning head    Pain Relieving Factors  positioning    Effect of Pain on Daily Activities  has to turn body rather than head; difficulty checking blind spot while driving; wakes her up at night         [facility] PT Assessment - 06/07/17 1608      Assessment   Referring Provider  Jessy Oto, MD    Onset Date/Surgical  Date  -- >1 yr    Hand Dominance  Right    Next MD Visit  none scheduled    Prior Therapy  none      Balance Screen   Has the patient fallen in the past 6 months  No    Has the patient had a decrease in activity level because of a fear of falling?   No    Is the patient reluctant to leave their home because of a fear of falling?   No      Home Environment   Living Environment  Private residence    Living Arrangements  Children    Type of [place] Access  Stairs to enter    Entrance Stairs-Number of Steps  3    [place]  One level      Prior Function   Level of Independence  Independent    Vocation  Full time employment    Vocation Requirements  lab work, office work - mostly sitting    Leisure  gym 2x/wk ([employer])      Observation/Other Assessments   Focus on Therapeutic Outcomes (FOTO)   Neck - 68% (32% limitation); 72% (38% limitation)      Posture/Postural Control   Posture/Postural Control   Postural limitations    Postural Limitations  Forward head;Rounded Shoulders    Posture Comments  reversal of cervical lordosis      ROM / Strength   AROM / PROM / Strength  AROM;Strength      AROM   Overall AROM Comments  B shoulder ROM WNL'    AROM Assessment Site  Cervical;Shoulder    Cervical Flexion  50    Cervical Extension  49    Cervical - Right Side Bend  25    Cervical - Left Side Bend  20    Cervical - Right Rotation  61    Cervical - Left Rotation  62      Strength   Strength Assessment Site  Shoulder    Right/Left Shoulder  Right;Left    Right Shoulder Flexion  4+/5    Right Shoulder ABduction  4+/5    Right Shoulder Internal Rotation  4+/5    Right Shoulder External Rotation  4+/5    Left Shoulder Flexion  4+/5    Left Shoulder ABduction  4+/5    Left Shoulder Internal Rotation  4+/5    Left Shoulder External Rotation  4+/5      Palpation   Palpation comment  ttp over UT, LS & rhomboids             Objective measurements completed on examination: See above findings.      [facility] Adult PT Treatment/Exercise - 06/07/17 1608      Exercises   Exercises  Neck      Neck Exercises: Standing   Other Standing Exercises  B rows & scap retraction + mini-shoulder extension with red TB 10x3" each      Neck Exercises: Seated   Other Seated Exercise  Lower cervical extension 10x5"    Other Seated Exercise  Scap retraction 10x5"      Neck Exercises: Stretches   Upper Trapezius Stretch  Right;Left;30 seconds;1 rep    Upper Trapezius Stretch Limitations  hand behind back    Corner Stretch  30 seconds;1 rep    Corner Stretch Limitations  low & high doorway stretch; mid level stretch deferred due c/o pressure on  airway    Other Neck Stretches  B SCM stretch x30"             PT Education - 06/07/17 1659    Education provided  Yes    Education Details  PT eval findings, anticipated POC & initial HEP    Person(s) Educated  Patient    Methods   Explanation;Demonstration;Handout    Comprehension  Verbalized understanding;Returned demonstration       PT Short Term Goals - 06/07/17 1659      PT SHORT TERM GOAL #1   Title  Indpendent with initial HEP    Status  New    Target Date  06/28/17      PT SHORT TERM GOAL #2   Title  Pt will verbalize understanding of neutral cervical spine and shoulder posture to reduce cervical muscle strain    Status  New    Target Date  06/28/17        PT Long Term Goals - 06/07/17 1659      PT LONG TERM GOAL #1   Title  Independent with ongoing HEP +/- gym program    Status  New    Target Date  07/23/17      PT LONG TERM GOAL #2   Title  Pt will routinely demonstrate neutral spine and shoulder posture at least 75% of the time w/o cues    Status  New    Target Date  07/23/17      PT LONG TERM GOAL #3   Title  Pt will verbalize understanding of/demonstrate proper body mechanics with typical daily tasks to reduce cervical strain    Status  New    Target Date  07/23/17      PT LONG TERM GOAL #4   Title  Cervical ROM WFL w/o increased pain to allow pt to safely check blindspot while driving    Status  New    Target Date  07/23/17      PT LONG TERM GOAL #5   Title  Pt will report at least 50% reduction in neck pain to allow for imporved activity and sleeping tolerance    Status  New    Target Date  07/23/17             Plan - 06/07/17 1659    Clinical Impression Statement  Donda is a 52 y/o female who presents to OP PT for cervicalgia of > 1 yr duration from cervical spondylosis. Pain at 6/10 but no radiculopathy or myelopathy present. Pt demonstrates postural abnormalities including reversal of cervical lordosis, abnormal muscle tension, impaired flexibility and decreased cervical ROM limiting activity tolerance and ability to participate in normal daily and leisure activities.  Pt will benefit from skilled PT to address deficits listed.    History and Personal Factors  relevant to plan of care:  RA, fibromyalgia, laryngopharyngeal reflux     Clinical Presentation  Stable    Clinical Decision Making  Low    Rehab Potential  Good    PT Frequency  2x / week    PT Duration  6 weeks    PT Treatment/Interventions  Patient/family education;ADLs/Self Care Home Management;Neuromuscular re-education;Therapeutic exercise;Therapeutic activities;Functional mobility training;Manual techniques;Dry needling;Taping;Passive range of motion;Electrical Stimulation;Moist Heat;Iontophoresis 4mg /ml Dexamethasone;Cryotherapy;Traction    Consulted and Agree with Plan of Care  Patient       Patient will benefit from skilled therapeutic intervention in order to improve the following deficits and impairments:  Pain, Impaired flexibility, Decreased range of  motion, Increased muscle spasms, Postural dysfunction, Improper body mechanics, Decreased activity tolerance  Visit Diagnosis: Cervicalgia  Abnormal posture  Cramp and spasm     Problem List Patient Active Problem List   Diagnosis Date Noted  . Asthma 04/06/2016  . Laryngopharyngeal reflux (LPR) 04/06/2016  . PCP NOTES >>>>> 03/09/2015  . Annual physical exam 12/21/2014  . Cardiomegaly, by MRI 03/16/2014  . Leukopenia 03/10/2014  . Lightheadedness 02/10/2011  . SINOATRIAL NODE DYSFUNCTION 08/06/2008  . ATRIAL SEPTAL DEFECT 06/29/2007  . Hypothyroidism 03/21/2007  . Essential hypertension 03/21/2007  . Allergic rhinitis 03/21/2007  . GERD-- dysphagia 03/21/2007  . CONSTIPATION 03/21/2007  . IRRITABLE BOWEL SYNDROME 03/21/2007  . RA (rheumatoid arthritis) ([place]) 03/21/2007  . LOW BACK PAIN 03/21/2007  . FIBROMYALGIA 03/21/2007    Percival Spanish, PT, MPT 06/07/2017, 6:00 PM  [facility] [address]  [place], [place], [address] Phone: [phone]   Fax:  [phone]  Name: Brittany Hodges MRN: [id number] Date of Birth: February 08, 1966

## 2017-06-14 ENCOUNTER — Encounter: Payer: Self-pay | Admitting: Physical Therapy

## 2017-06-14 ENCOUNTER — Ambulatory Visit: Payer: BLUE CROSS/BLUE SHIELD | Admitting: Endocrinology

## 2017-06-14 ENCOUNTER — Ambulatory Visit: Payer: BLUE CROSS/BLUE SHIELD | Admitting: Physical Therapy

## 2017-06-14 ENCOUNTER — Encounter: Payer: Self-pay | Admitting: Endocrinology

## 2017-06-14 VITALS — BP 112/72 | HR 41 | Ht 68.5 in | Wt 131.6 lb

## 2017-06-14 DIAGNOSIS — E039 Hypothyroidism, unspecified: Secondary | ICD-10-CM

## 2017-06-14 DIAGNOSIS — M542 Cervicalgia: Secondary | ICD-10-CM | POA: Diagnosis not present

## 2017-06-14 DIAGNOSIS — R293 Abnormal posture: Secondary | ICD-10-CM

## 2017-06-14 DIAGNOSIS — R252 Cramp and spasm: Secondary | ICD-10-CM

## 2017-06-14 LAB — T4, FREE: Free T4: 0.74 ng/dL (ref 0.60–1.60)

## 2017-06-14 LAB — TSH: TSH: 3.84 u[IU]/mL (ref 0.35–4.50)

## 2017-06-14 LAB — T3, FREE: T3, Free: 3.7 pg/mL (ref 2.3–4.2)

## 2017-06-14 NOTE — Therapy (Signed)
[facility] [address]  [place], [place], [address] Phone: [phone]   Fax:  [phone]  Physical Therapy Treatment  Patient Details  Name: Brittany Hodges MRN: [id number] Date of Birth: [birth year] Referring Provider: Jessy Oto, MD   Encounter Date: 06/14/2017  PT End of Session - 06/14/17 1610    Visit Number  2    Number of Visits  12    Date for PT Re-Evaluation  07/23/17    Authorization Type  BCBS    Authorization - Number of Visits  30    PT Start Time  1610    PT Stop Time  1655    PT Time Calculation (min)  45 min    Activity Tolerance  Patient tolerated treatment well    Behavior During Therapy  [facility] for tasks assessed/performed       Past Medical History:  Diagnosis Date  . Allergic rhinitis, cause unspecified   . Anemia   . Asthma   . Atrial septal defect    s/p repair;   Echo 08/07/10: EF 55-60%; mild MR; atrial septal aneurysm; no residual ASD  . Chest pain, unspecified    cardiac cath 07/31/10: normal coronaries; vigorous LVF  . Esophageal reflux   . Fibromyalgia   . Headache(784.0)   . Hematuria, unspecified   . Hypertension    no meds   . Hypothyroidism   . Irritable bowel syndrome   . Low back pain   . Rheumatoid arthritis(714.0)    Dr Herold Harms  . Scoliosis   . Sinoatrial node dysfunction (HCC)    eval. for chronotropic competence completed in past    Past Surgical History:  Procedure Laterality Date  . ASD REPAIR  1985  . TONSILLECTOMY AND ADENOIDECTOMY    . TUBAL LIGATION      There were no vitals filed for this visit.  Subjective Assessment - 06/14/17 1613    Subjective  Pt reports she has been doing the HEP exercises at home w/o any issues.    Diagnostic tests  03/08/17 Cervical MRI:  Straightening and kyphotic curvature of the cervical spine. Degenerative disc disease from C3-4 through C5-6. At the C3-4 level, there is edema within the endplate marrow,  finding that can be associated with neck pain. There is narrowing of the ventral subarachnoid space at C3-4, C4-5 and C5-6, but no cord compression. There is no significant foraminal narrowing noted in the region.    Patient Stated Goals  "relieve some of the pain"    Currently in Pain?  Yes    Pain Score  6     Pain Location  Neck    Pain Orientation  Lower    Pain Descriptors / Indicators  Aching    Pain Type  Chronic pain    Pain Onset  More than a month ago    Pain Frequency  Intermittent                      [facility] Adult PT Treatment/Exercise - 06/14/17 1610      Exercises   Exercises  Neck      Neck Exercises: Machines for Strengthening   UBE (Upper Arm Bike)  L 2.0 fwd/back x 3' each    Cybex Row  15# x15 narrow grip      Neck Exercises: Theraband   Shoulder External Rotation  10 reps Yellow    Shoulder External Rotation Limitations  hooklying  over pool noodle    Horizontal ABduction  10 reps;Red    Horizontal ABduction Limitations  hooklying over pool noodle    Other Theraband Exercises  alt shoulder flexion + opp shoulder extension with yellow TB x10; hooklying over pool noodle      Neck Exercises: Standing   Wall Push Ups  10 reps      Neck Exercises: Supine   Other Supine Exercise  B serratus punch x10      Manual Therapy   Manual Therapy  Soft tissue mobilization;Myofascial release;Passive ROM    Manual therapy comments  pt hooklying    Soft tissue mobilization  B cervical paraspinals, scalenes, SCM, UT & LS    Myofascial Release  TPR R UT    Passive ROM  manual stretches for B UT. LS & scalenes      Neck Exercises: Stretches   Other Neck Stretches  hookyling pec stretch over pool noodle x60" (varying UE position)               PT Short Term Goals - 06/14/17 1615      PT SHORT TERM GOAL #1   Title  Indpendent with initial HEP    Status  On-going      PT SHORT TERM GOAL #2   Title  Pt will verbalize understanding of neutral  cervical spine and shoulder posture to reduce cervical muscle strain    Status  On-going        PT Long Term Goals - 06/14/17 1615      PT LONG TERM GOAL #1   Title  Independent with ongoing HEP +/- gym program    Status  On-going      PT LONG TERM GOAL #2   Title  Pt will routinely demonstrate neutral spine and shoulder posture at least 75% of the time w/o cues    Status  On-going      PT LONG TERM GOAL #3   Title  Pt will verbalize understanding of/demonstrate proper body mechanics with typical daily tasks to reduce cervical strain    Status  On-going      PT LONG TERM GOAL #4   Title  Cervical ROM WFL w/o increased pain to allow pt to safely check blindspot while driving    Status  On-going      PT LONG TERM GOAL #5   Title  Pt will report at least 50% reduction in neck pain to allow for imporved activity and sleeping tolerance    Status  On-going            Plan - 06/14/17 1615    Clinical Impression Statement  Pt reporting good compliance with HEP, denying any concerns or need for review. Manual therapy targeting muscle tension in cervical musculature with emphasis on R with pt noting some releif following this. Strengthening focusing on scapular stabilization with cues necessary for postural alignment during most of the standing exercises. Pt completing session with pain decreased from arrival.    Rehab Potential  Good    PT Treatment/Interventions  Patient/family education;ADLs/Self Care Home Management;Neuromuscular re-education;Therapeutic exercise;Therapeutic activities;Functional mobility training;Manual techniques;Dry needling;Taping;Passive range of motion;Electrical Stimulation;Moist Heat;Iontophoresis 4mg /ml Dexamethasone;Cryotherapy;Traction    Consulted and Agree with Plan of Care  Patient       Patient will benefit from skilled therapeutic intervention in order to improve the following deficits and impairments:  Pain, Impaired flexibility, Decreased range  of motion, Increased muscle spasms, Postural dysfunction, Improper body mechanics, Decreased activity tolerance  Visit Diagnosis: Cervicalgia  Abnormal posture  Cramp and spasm     Problem List Patient Active Problem List   Diagnosis Date Noted  . Asthma 04/06/2016  . Laryngopharyngeal reflux (LPR) 04/06/2016  . PCP NOTES >>>>> 03/09/2015  . Annual physical exam 12/21/2014  . Cardiomegaly, by MRI 03/16/2014  . Leukopenia 03/10/2014  . Lightheadedness 02/10/2011  . SINOATRIAL NODE DYSFUNCTION 08/06/2008  . ATRIAL SEPTAL DEFECT 06/29/2007  . Hypothyroidism 03/21/2007  . Essential hypertension 03/21/2007  . Allergic rhinitis 03/21/2007  . GERD-- dysphagia 03/21/2007  . CONSTIPATION 03/21/2007  . IRRITABLE BOWEL SYNDROME 03/21/2007  . RA (rheumatoid arthritis) ([place]) 03/21/2007  . LOW BACK PAIN 03/21/2007  . FIBROMYALGIA 03/21/2007    Percival Spanish, PT, MPT 06/14/2017, 5:01 PM  [facility] [address]  [place], [place], [address] Phone: [phone]   Fax:  [phone]  Name: Brittany Hodges MRN: [id number] Date of Birth: 1966/01/05

## 2017-06-14 NOTE — Progress Notes (Signed)
Patient ID: Brittany Hodges, female   DOB: 14-Jun-1965, 52 y.o.   MRN: [id number]            Referring Physician: Larose Kells  Reason for Appointment:  Hypothyroidism, new visit    History of Present Illness:   Hypothyroidism was first diagnosed  several years ago  History of her initial abnormal TSH is not available and appears to have been on levothyroxine since at least 2012  At the time of diagnosis patient was not having symptoms of  fatigue, cold sensitivity, difficulty concentrating, dry skin, weight gain or hair loss .           The patient has been treated with  doses of levothyroxine ranging from 75/up to 125 g the last several year  With starting thyroid supplementation she did not feel any different initially Also she usually does not feel any different with her dosage adjustments that have been done regularly  Patient says that in 2017 she was told during the hospitalization for syncope in [facility] that her thyroid level was too high, TSH apparently was 0.06 in 2017. Although her TSH was normal in 3/18 when she was taking 88 g levothyroxine her TSH was high in 11/2016 and she was told to take 100 g With this her TSH was low in September and she was at bedtime complaining of some fatigue   Patient says that because of her being consistent thyroid levels she decided that she will not take her levothyroxine any more in September She does not think she felt any worse with doing this in fact think she feels more energy now However she is also trying to eat healthier with less meat and more vegetables and generally have more healthier nutrients and supplements  Patient's weight history is as follows:  Wt Readings from Last 3 Encounters:  06/14/17 131 lb 9.6 oz (59.7 kg)  06/02/17 132 lb 2 oz (59.9 kg)  04/15/17 128 lb (58.1 kg)    Thyroid function results have been as follows:  Lab Results  Component Value Date   TSH 15.94 (H) 03/22/2017   TSH 0.10 (L) 01/27/2017   TSH 5.22 (H) 12/07/2016   TSH 1.75 07/27/2016   FREET4 1.40 01/27/2017   T3FREE 3.9 01/27/2017    Lab Results  Component Value Date   TSH 15.94 (H) 03/22/2017   TSH 0.10 (L) 01/27/2017   TSH 5.22 (H) 12/07/2016   TSH 1.75 07/27/2016   TSH 0.27 (L) 06/24/2016   TSH 0.06 (L) 01/27/2016   TSH 1.14 05/24/2015   TSH 1.230 03/08/2015   TSH 0.256 (L) 02/15/2015     Past Medical History:  Diagnosis Date  . Allergic rhinitis, cause unspecified   . Anemia   . Asthma   . Atrial septal defect    s/p repair;   Echo 08/07/10: EF 55-60%; mild MR; atrial septal aneurysm; no residual ASD  . Chest pain, unspecified    cardiac cath 07/31/10: normal coronaries; vigorous LVF  . Esophageal reflux   . Fibromyalgia   . Headache(784.0)   . Hematuria, unspecified   . Hypertension    no meds   . Hypothyroidism   . Irritable bowel syndrome   . Low back pain   . Rheumatoid arthritis(714.0)    Dr Herold Harms  . Scoliosis   . Sinoatrial node dysfunction (HCC)    eval. for chronotropic competence completed in past    Past Surgical History:  Procedure Laterality Date  . ASD REPAIR  1985  .  TONSILLECTOMY AND ADENOIDECTOMY    . TUBAL LIGATION      Family History  Problem Relation Age of Onset  . Hypertension Mother   . Heart disease Maternal Grandmother   . Breast cancer Other        aunt  . Diabetes Other        GM  . Colon cancer Neg Hx     Social History:  reports that  has never smoked. she has never used smokeless tobacco. She reports that she does not drink alcohol or use drugs.  Allergies:  Allergies  Allergen Reactions  . Etanercept   . Methotrexate Derivatives     Caused her WBC to drop  . Pregabalin     Allergies as of 06/14/2017      Reactions   Etanercept    Methotrexate Derivatives    Caused her WBC to drop   Pregabalin       Medication List        Accurate as of 06/14/17  2:27 PM. Always use your most recent med list.          AEROCHAMBER MV  inhaler Use as instructed   albuterol 108 (90 Base) MCG/ACT inhaler Commonly known as:  PROAIR HFA Inhale 2 puffs into the lungs every 6 (six) hours as needed. For shortness of breath and wheezing   amLODipine 5 MG tablet Commonly known as:  NORVASC Take 1 tablet (5 mg total) by mouth daily.   azelastine 0.1 % nasal spray Commonly known as:  ASTELIN Place 2 sprays into both nostrils at bedtime as needed for rhinitis. Use in each nostril as directed   CHLORELLA PO Take 1 tablet by mouth daily.   fluticasone 50 MCG/ACT nasal spray Commonly known as:  FLONASE Place 2 sprays into both nostrils daily as needed.   GINGER ROOT PO Take by mouth.   meloxicam 7.5 MG tablet Commonly known as:  MOBIC Take 1 tablet (7.5 mg total) by mouth daily.   methocarbamol 500 MG tablet Commonly known as:  ROBAXIN Take 1 tablet (500 mg total) by mouth 2 (two) times daily.   TART CHERRY ADVANCED PO Take by mouth.   TURMERIC PO Take by mouth.   UNABLE TO FIND fulvic minerals   V-R NASAL SPRAY SALINE 0.65 % nasal spray Generic drug:  sodium chloride Place into the nose. As directed   Vitamin D 1000 units capsule Take 1,000 Units by mouth daily.          Review of Systems  Constitutional: Negative for reduced appetite.       No significant weight change recently  Respiratory: Negative for shortness of breath.   Cardiovascular: Negative for palpitations and leg swelling.  Gastrointestinal: Negative for constipation.  Endocrine: Positive for cold intolerance and heat intolerance.       Has mild fluctuation in her cold or heat sensitivity at variable times  Musculoskeletal: Negative for joint pain.  Neurological: Negative for weakness.  Psychiatric/Behavioral: Negative for nervousness and insomnia.   Lab Results  Component Value Date   CHOL 142 07/27/2016   HDL 57.90 07/27/2016   LDLCALC 70 07/27/2016   TRIG 68.0 07/27/2016   CHOLHDL 2 07/27/2016         Less meat         Examination:    BP 112/72 (BP Location: Left Arm, Patient Position: Sitting, Cuff Size: Normal)   Pulse (!) 41   Ht 5' 8.5" (1.74 m)   Wt 131 lb  9.6 oz (59.7 kg)   BMI 19.72 kg/m   GENERAL:  Relatively thin build.   No pallor, clubbing, lymphadenopathy or edema.   Skin:  no rash or pigmentation.  EYES:  No prominence of the eyes or swelling of the eyelids  ENT: Oral mucosa and tongue normal.  THYROID:  Not palpable.  HEART:  Normal  S1 and S2; no murmur. She has a midsystolic click present  CHEST:    Lungs: Vescicular breath sounds heard equally.  No crepitations/ wheeze.  ABDOMEN:  No distention.  Liver and spleen not palpable.  No other mass or tenderness.  NEUROLOGICAL: Reflexes are showing SLOW relaxation at the biceps  JOINTS:  Normal. Has loss of lordosis of cervical spine   Assessment:  HYPOTHYROIDISM long-standing and by history minimally symptomatic  Although she has had variable doses of levothyroxine between 75 up to 125 in the last few years it is unclear how much her levothyroxine requirement is now Also surprisingly she is not symptomatic at all with her last TSH of about 15 which was done soon after she stopped her levothyroxine Patient thinks she feels worse when she takes levothyroxine and is afraid of hyperthyroidism also No goiter on exam  Has been off her levothyroxine for about 2 months now  PLAN:   Recheck thyroid levels today to see if her hypothyroidism is consistent Most likely if her TSH is consistently over 10 she can start with at least 25 g of levothyroxine She may be left on a dose that would keep her TSH around upper limit of normal since she is usually not symptomatic at that point  Follow-up about 2 months    Elayne Snare 06/14/2017, 2:27 PM   Consultation note copy sent to the PCP  Note: This office note was prepared with Dragon voice recognition system technology. Any transcriptional errors that result from this process are  unintentional.  ADDENDUM:  Thyroid levels are normal now, she can be observed without treatment for the next 4 months now Likely she has had recovery of her suppressed thyroid over time and may have had subclinical hypothyroidism all along  Office Visit on 06/14/2017  Component Date Value Ref Range Status  . T3, Free 06/14/2017 3.7  2.3 - 4.2 pg/mL Final  . TSH 06/14/2017 3.84  0.35 - 4.50 uIU/mL Final  . Free T4 06/14/2017 0.74  0.60 - 1.60 ng/dL Final   Comment: Specimens from patients who are undergoing biotin therapy and /or ingesting biotin supplements may contain high levels of biotin.  The higher biotin concentration in these specimens interferes with this Free T4 assay.  Specimens that contain high levels  of biotin may cause false high results for this Free T4 assay.  Please interpret results in light of the total clinical presentation of the patient.

## 2017-06-17 ENCOUNTER — Ambulatory Visit: Payer: BLUE CROSS/BLUE SHIELD | Admitting: Physical Therapy

## 2017-06-21 ENCOUNTER — Ambulatory Visit: Payer: BLUE CROSS/BLUE SHIELD | Attending: Specialist | Admitting: Physical Therapy

## 2017-06-21 ENCOUNTER — Encounter: Payer: Self-pay | Admitting: Physical Therapy

## 2017-06-21 DIAGNOSIS — M542 Cervicalgia: Secondary | ICD-10-CM

## 2017-06-21 DIAGNOSIS — R252 Cramp and spasm: Secondary | ICD-10-CM | POA: Diagnosis present

## 2017-06-21 DIAGNOSIS — R293 Abnormal posture: Secondary | ICD-10-CM

## 2017-06-21 NOTE — Patient Instructions (Signed)

## 2017-06-21 NOTE — Therapy (Signed)
[facility] [address]  [place], [place], [address] Phone: [phone]   Fax:  [phone]  Physical Therapy Treatment  Patient Details  Name: Brittany Hodges MRN: [id number] Date of Birth: Feb 05, 1966 Referring Provider: Jessy Oto, MD   Encounter Date: 06/21/2017  PT End of Session - 06/21/17 1706    Visit Number  3    Number of Visits  12    Date for PT Re-Evaluation  07/23/17    Authorization Type  BCBS    Authorization - Number of Visits  30    PT Start Time  1706    PT Stop Time  1748    PT Time Calculation (min)  42 min    Activity Tolerance  Patient tolerated treatment well    Behavior During Therapy  [facility] for tasks assessed/performed       Past Medical History:  Diagnosis Date  . Allergic rhinitis, cause unspecified   . Anemia   . Asthma   . Atrial septal defect    s/p repair;   Echo 08/07/10: EF 55-60%; mild MR; atrial septal aneurysm; no residual ASD  . Chest pain, unspecified    cardiac cath 07/31/10: normal coronaries; vigorous LVF  . Esophageal reflux   . Fibromyalgia   . Headache(784.0)   . Hematuria, unspecified   . Hypertension    no meds   . Hypothyroidism   . Irritable bowel syndrome   . Low back pain   . Rheumatoid arthritis(714.0)    Dr Herold Harms  . Scoliosis   . Sinoatrial node dysfunction (HCC)    eval. for chronotropic competence completed in past    Past Surgical History:  Procedure Laterality Date  . ASD REPAIR  1985  . TONSILLECTOMY AND ADENOIDECTOMY    . TUBAL LIGATION      There were no vitals filed for this visit.  Subjective Assessment - 06/21/17 1709    Subjective  Pt reporting pain worse at night recently, but getting better during the days.    Diagnostic tests  03/08/17 Cervical MRI:  Straightening and kyphotic curvature of the cervical spine. Degenerative disc disease from C3-4 through C5-6. At the C3-4 level, there is edema within the endplate  marrow, finding that can be associated with neck pain. There is narrowing of the ventral subarachnoid space at C3-4, C4-5 and C5-6, but no cord compression. There is no significant foraminal narrowing noted in the region.    Patient Stated Goals  "relieve some of the pain"    Currently in Pain?  Yes    Pain Score  4     Pain Location  Neck    Pain Orientation  Lower    Pain Descriptors / Indicators  Aching    Pain Type  Chronic pain                      [facility] Adult PT Treatment/Exercise - 06/21/17 1706      Exercises   Exercises  Neck      Neck Exercises: Machines for Strengthening   UBE (Upper Arm Bike)  L 2.5 fwd/back x 3' each      Neck Exercises: Theraband   Shoulder External Rotation  15 reps;Red    Shoulder External Rotation Limitations  hooklying over pool noodle    Horizontal ABduction  15 reps;Red    Horizontal ABduction Limitations  hooklying over pool noodle    Other Theraband Exercises  alt  shoulder flexion + opp shoulder extension with red TB x15; hooklying over pool noodle      Neck Exercises: Supine   Other Supine Exercise  B serratus punch 3# x15      Neck Exercises: Stretches   Other Neck Stretches  hookyling pec stretch over pool noodle x60" (varying UE position)             PT Education - 06/21/17 1711    Education provided  Yes    Education Details  Posture & body mechanics education    Person(s) Educated  Patient    Methods  Explanation;Demonstration;Handout    Comprehension  Verbalized understanding       PT Short Term Goals - 06/14/17 1615      PT SHORT TERM GOAL #1   Title  Indpendent with initial HEP    Status  On-going      PT SHORT TERM GOAL #2   Title  Pt will verbalize understanding of neutral cervical spine and shoulder posture to reduce cervical muscle strain    Status  On-going        PT Long Term Goals - 06/14/17 1615      PT LONG TERM GOAL #1   Title  Independent with ongoing HEP +/- gym program     Status  On-going      PT LONG TERM GOAL #2   Title  Pt will routinely demonstrate neutral spine and shoulder posture at least 75% of the time w/o cues    Status  On-going      PT LONG TERM GOAL #3   Title  Pt will verbalize understanding of/demonstrate proper body mechanics with typical daily tasks to reduce cervical strain    Status  On-going      PT LONG TERM GOAL #4   Title  Cervical ROM WFL w/o increased pain to allow pt to safely check blindspot while driving    Status  On-going      PT LONG TERM GOAL #5   Title  Pt will report at least 50% reduction in neck pain to allow for imporved activity and sleeping tolerance    Status  On-going            Plan - 06/21/17 1748    Clinical Impression Statement  Pt noting lasting pain relief until the next day following last PT session, with pain levels remaining down from previous level even today. Better tolerance for hookying theraband exercises today, therefore discussed ways to create roll for HEP purposes. Will plan to continue progression to more upright strengthening over upcoming visits.    Rehab Potential  Good    PT Treatment/Interventions  Patient/family education;ADLs/Self Care Home Management;Neuromuscular re-education;Therapeutic exercise;Therapeutic activities;Functional mobility training;Manual techniques;Dry needling;Taping;Passive range of motion;Electrical Stimulation;Moist Heat;Iontophoresis 4mg /ml Dexamethasone;Cryotherapy;Traction    Consulted and Agree with Plan of Care  Patient       Patient will benefit from skilled therapeutic intervention in order to improve the following deficits and impairments:  Pain, Impaired flexibility, Decreased range of motion, Increased muscle spasms, Postural dysfunction, Improper body mechanics, Decreased activity tolerance  Visit Diagnosis: Cervicalgia  Abnormal posture  Cramp and spasm     Problem List Patient Active Problem List   Diagnosis Date Noted  . Asthma  04/06/2016  . Laryngopharyngeal reflux (LPR) 04/06/2016  . PCP NOTES >>>>> 03/09/2015  . Annual physical exam 12/21/2014  . Cardiomegaly, by MRI 03/16/2014  . Leukopenia 03/10/2014  . Lightheadedness 02/10/2011  . SINOATRIAL NODE DYSFUNCTION 08/06/2008  .  ATRIAL SEPTAL DEFECT 06/29/2007  . Hypothyroidism 03/21/2007  . Essential hypertension 03/21/2007  . Allergic rhinitis 03/21/2007  . GERD-- dysphagia 03/21/2007  . CONSTIPATION 03/21/2007  . IRRITABLE BOWEL SYNDROME 03/21/2007  . RA (rheumatoid arthritis) ([place]) 03/21/2007  . LOW BACK PAIN 03/21/2007  . FIBROMYALGIA 03/21/2007    Percival Spanish, PT, MPT 06/21/2017, 6:14 PM  [facility] [address]  [place], [place], [address] Phone: [phone]   Fax:  [phone]  Name: Brittany Hodges MRN: [id number] Date of Birth: [birth year]

## 2017-06-24 ENCOUNTER — Ambulatory Visit: Payer: BLUE CROSS/BLUE SHIELD

## 2017-06-24 DIAGNOSIS — R252 Cramp and spasm: Secondary | ICD-10-CM

## 2017-06-24 DIAGNOSIS — M542 Cervicalgia: Secondary | ICD-10-CM | POA: Diagnosis not present

## 2017-06-24 DIAGNOSIS — R293 Abnormal posture: Secondary | ICD-10-CM

## 2017-06-24 NOTE — Therapy (Signed)
[facility] [address]  [place], [place], [address] Phone: [phone]   Fax:  [phone]  Physical Therapy Treatment  Patient Details  Name: Brittany Hodges MRN: [id number] Date of Birth: [birth year] Referring Provider: Jessy Oto, MD   Encounter Date: 06/24/2017  PT End of Session - 06/24/17 1737    Visit Number  4    Number of Visits  12    Date for PT Re-Evaluation  07/23/17    Authorization Type  BCBS    Authorization - Number of Visits  30    PT Start Time  1700    PT Stop Time  6644    PT Time Calculation (min)  47 min    Activity Tolerance  Patient tolerated treatment well    Behavior During Therapy  [facility] for tasks assessed/performed       Past Medical History:  Diagnosis Date  . Allergic rhinitis, cause unspecified   . Anemia   . Asthma   . Atrial septal defect    s/p repair;   Echo 08/07/10: EF 55-60%; mild MR; atrial septal aneurysm; no residual ASD  . Chest pain, unspecified    cardiac cath 07/31/10: normal coronaries; vigorous LVF  . Esophageal reflux   . Fibromyalgia   . Headache(784.0)   . Hematuria, unspecified   . Hypertension    no meds   . Hypothyroidism   . Irritable bowel syndrome   . Low back pain   . Rheumatoid arthritis(714.0)    Dr Herold Harms  . Scoliosis   . Sinoatrial node dysfunction (HCC)    eval. for chronotropic competence completed in past    Past Surgical History:  Procedure Laterality Date  . ASD REPAIR  1985  . TONSILLECTOMY AND ADENOIDECTOMY    . TUBAL LIGATION      There were no vitals filed for this visit.  Subjective Assessment - 06/24/17 1703    Subjective  Pt. noting she, "slept wrong" on neck last night and has had increased pain today.  Woke up four times with neck pain last night.      Diagnostic tests  03/08/17 Cervical MRI:  Straightening and kyphotic curvature of the cervical spine. Degenerative disc disease from C3-4 through C5-6. At the  C3-4 level, there is edema within the endplate marrow, finding that can be associated with neck pain. There is narrowing of the ventral subarachnoid space at C3-4, C4-5 and C5-6, but no cord compression. There is no significant foraminal narrowing noted in the region.    Patient Stated Goals  "relieve some of the pain"    Currently in Pain?  Yes    Pain Score  6  8/10 at worse today     Pain Location  Neck    Pain Orientation  Lower    Pain Descriptors / Indicators  Aching;Throbbing    Pain Type  Chronic pain    Pain Radiating Towards  B upper shoulder     Pain Onset  More than a month ago    Pain Frequency  Intermittent    Aggravating Factors   Looking up or turning head                       [facility] Adult PT Treatment/Exercise - 06/24/17 1708      Self-Care   Self-Care  Other Self-Care Comments    Other Self-Care Comments   instruction on self-ball release of rhomboids on  wall       Neck Exercises: Machines for Strengthening   UBE (Upper Arm Bike)  L 2.5 fwd/back x 3' each    Cybex Row  15# x 15 narrow grip      Neck Exercises: Theraband   Shoulder External Rotation  Red;10 reps    Shoulder External Rotation Limitations  standing leaning on pool noodle on wall     Horizontal ABduction  10 reps;Red    Horizontal ABduction Limitations  standing leaning on pool noodle on wall     Other Theraband Exercises  alt shoulder flexion + opp shoulder extension with yellow TB x 10; leaning on pool noodle on wall       Neck Exercises: Standing   Other Standing Exercises  Chin tuck + scap. retraction leaning on pool noodle 5" x 10 reps       Manual Therapy   Manual Therapy  Soft tissue mobilization;Myofascial release;Passive ROM;Manual Traction    Manual therapy comments  pt hooklying    Soft tissue mobilization  B cervical paraspinals, scalenes, UT & LS    Myofascial Release  TPR B UT    Passive ROM  manual stretches for B UT. LS & scalenes    Manual Traction  manual cervical  traction 2 x 30 sec; good relief from neck pain reported with this       Neck Exercises: Stretches   Upper Trapezius Stretch  Right;Left;30 seconds;1 rep    Upper Trapezius Stretch Limitations  hand behind back    Other Neck Stretches  B STM stretch x 30 sec                PT Short Term Goals - 06/14/17 1615      PT SHORT TERM GOAL #1   Title  Indpendent with initial HEP    Status  On-going      PT SHORT TERM GOAL #2   Title  Pt will verbalize understanding of neutral cervical spine and shoulder posture to reduce cervical muscle strain    Status  On-going        PT Long Term Goals - 06/14/17 1615      PT LONG TERM GOAL #1   Title  Independent with ongoing HEP +/- gym program    Status  On-going      PT LONG TERM GOAL #2   Title  Pt will routinely demonstrate neutral spine and shoulder posture at least 75% of the time w/o cues    Status  On-going      PT LONG TERM GOAL #3   Title  Pt will verbalize understanding of/demonstrate proper body mechanics with typical daily tasks to reduce cervical strain    Status  On-going      PT LONG TERM GOAL #4   Title  Cervical ROM WFL w/o increased pain to allow pt to safely check blindspot while driving    Status  On-going      PT LONG TERM GOAL #5   Title  Pt will report at least 50% reduction in neck pain to allow for imporved activity and sleeping tolerance    Status  On-going            Plan - 06/24/17 1707    Clinical Impression Statement  Pt. reporting she "slept wrong" on neck last night and has had increased neck pain today.  Pt. reporting she has been adjusting pillow positioning as previously discussed however unsure of trigger during last night's sleep.  Good reduction in neck and upper shoulder pain following manual STM and gentle cervical stretching today.  Further reduction in pain levels following introduction into standing theraband strengthening for scapular musculature.  Will continue to progress toward  goals in coming visits.    PT Treatment/Interventions  Patient/family education;ADLs/Self Care Home Management;Neuromuscular re-education;Therapeutic exercise;Therapeutic activities;Functional mobility training;Manual techniques;Dry needling;Taping;Passive range of motion;Electrical Stimulation;Moist Heat;Iontophoresis 4mg /ml Dexamethasone;Cryotherapy;Traction    Consulted and Agree with Plan of Care  Patient       Patient will benefit from skilled therapeutic intervention in order to improve the following deficits and impairments:  Pain, Impaired flexibility, Decreased range of motion, Increased muscle spasms, Postural dysfunction, Improper body mechanics, Decreased activity tolerance  Visit Diagnosis: Cervicalgia  Abnormal posture  Cramp and spasm     Problem List Patient Active Problem List   Diagnosis Date Noted  . Asthma 04/06/2016  . Laryngopharyngeal reflux (LPR) 04/06/2016  . PCP NOTES >>>>> 03/09/2015  . Annual physical exam 12/21/2014  . Cardiomegaly, by MRI 03/16/2014  . Leukopenia 03/10/2014  . Lightheadedness 02/10/2011  . SINOATRIAL NODE DYSFUNCTION 08/06/2008  . ATRIAL SEPTAL DEFECT 06/29/2007  . Hypothyroidism 03/21/2007  . Essential hypertension 03/21/2007  . Allergic rhinitis 03/21/2007  . GERD-- dysphagia 03/21/2007  . CONSTIPATION 03/21/2007  . IRRITABLE BOWEL SYNDROME 03/21/2007  . RA (rheumatoid arthritis) ([place]) 03/21/2007  . LOW BACK PAIN 03/21/2007  . FIBROMYALGIA 03/21/2007    Bess Harvest, PTA 06/24/17 5:59 PM   [facility] [address]  [place], [place], [address] Phone: [phone]   Fax:  [phone]  Name: Brittany Hodges MRN: [id number] Date of Birth: Feb 07, 1966

## 2017-06-28 ENCOUNTER — Ambulatory Visit: Payer: BLUE CROSS/BLUE SHIELD | Admitting: Physical Therapy

## 2017-07-01 ENCOUNTER — Ambulatory Visit: Payer: BLUE CROSS/BLUE SHIELD

## 2017-07-05 ENCOUNTER — Encounter: Payer: Self-pay | Admitting: Physical Therapy

## 2017-07-05 ENCOUNTER — Ambulatory Visit: Payer: BLUE CROSS/BLUE SHIELD | Admitting: Physical Therapy

## 2017-07-05 DIAGNOSIS — R252 Cramp and spasm: Secondary | ICD-10-CM

## 2017-07-05 DIAGNOSIS — M542 Cervicalgia: Secondary | ICD-10-CM

## 2017-07-05 DIAGNOSIS — R293 Abnormal posture: Secondary | ICD-10-CM

## 2017-07-05 NOTE — Therapy (Signed)
[facility] [address]  [place], [place], [address] Phone: [phone]   Fax:  [phone]  Physical Therapy Treatment  Patient Details  Name: Brittany Hodges MRN: [id number] Date of Birth: 1965-07-09 Referring Provider: Jessy Oto, MD   Encounter Date: 07/05/2017  PT End of Session - 07/05/17 1703    Visit Number  5    Number of Visits  12    Date for PT Re-Evaluation  07/23/17    Authorization Type  [employer]    Authorization - Number of Visits  30    PT Start Time  1703    PT Stop Time  1747    PT Time Calculation (min)  44 min    Activity Tolerance  Patient tolerated treatment well    Behavior During Therapy  [facility] for tasks assessed/performed       Past Medical History:  Diagnosis Date  . Allergic rhinitis, cause unspecified   . Anemia   . Asthma   . Atrial septal defect    s/p repair;   Echo 08/07/10: EF 55-60%; mild MR; atrial septal aneurysm; no residual ASD  . Chest pain, unspecified    cardiac cath 07/31/10: normal coronaries; vigorous LVF  . Esophageal reflux   . Fibromyalgia   . Headache(784.0)   . Hematuria, unspecified   . Hypertension    no meds   . Hypothyroidism   . Irritable bowel syndrome   . Low back pain   . Rheumatoid arthritis(714.0)    Dr Herold Harms  . Scoliosis   . Sinoatrial node dysfunction (HCC)    eval. for chronotropic competence completed in past    Past Surgical History:  Procedure Laterality Date  . ASD REPAIR  1985  . TONSILLECTOMY AND ADENOIDECTOMY    . TUBAL LIGATION      There were no vitals filed for this visit.  Subjective Assessment - 07/05/17 1705    Subjective  Pt noting "rough day" as of last therapy session, but overall things have been improving.    Diagnostic tests  03/08/17 Cervical MRI:  Straightening and kyphotic curvature of the cervical spine. Degenerative disc disease from C3-4 through C5-6. At the C3-4 level, there is edema within the  endplate marrow, finding that can be associated with neck pain. There is narrowing of the ventral subarachnoid space at C3-4, C4-5 and C5-6, but no cord compression. There is no significant foraminal narrowing noted in the region.    Patient Stated Goals  "relieve some of the pain"    Currently in Pain?  Yes    Pain Score  4     Pain Location  Neck    Pain Orientation  Lower    Pain Descriptors / Indicators  Aching    Pain Type  Chronic pain    Pain Onset  More than a month ago    Pain Frequency  Intermittent                      [facility] Adult PT Treatment/Exercise - 07/05/17 1703      Exercises   Exercises  Neck      Neck Exercises: Machines for Strengthening   UBE (Upper Arm Bike)  L 3.0 fwd/back x 3' each      Neck Exercises: Theraband   Shoulder Extension  15 reps;Green    Shoulder Extension Limitations  standing    Rows  15 reps;Green    Rows Limitations  standing    Shoulder External Rotation  15 reps;Red    Shoulder External Rotation Limitations  standing (doorframe at back for scapular input)    Horizontal ABduction  15 reps;Red    Horizontal ABduction Limitations  standing (doorframe at back for scapular input)    Other Theraband Exercises  alt shoulder flexion + opp shoulder extension with red TB x15; standing (doorframe at back for scapular input)      Neck Exercises: Standing   Wall Push Ups  10 reps    Wall Push Ups Limitations  on orange Pball on wall    Wall Wash  B shoulder - 60dg overhead arc x10      Neck Exercises: Prone   W Back  10 reps    W Back Limitations  W's over green Pball; cues for neutral spine    Shoulder Extension  10 reps    Shoulder Extension Limitations  I's over green Pball; cues for neutral spine    Other Prone Exercise  T's & Y's over green Pball x10 each; cues for neutral spine      Manual Therapy   Manual Therapy  Joint mobilization;Soft tissue mobilization;Myofascial release;Passive ROM    Joint Mobilization  R 1st &  2nd rib inferior mobs    Soft tissue mobilization  B cervical paraspinals, scalenes, UT, LS & SCM    Myofascial Release  TPR R UT    Passive ROM  manual stretches for B UT, LS & scalenes             PT Education - 07/05/17 1747    Education provided  Yes    Education Details  HEP update    Person(s) Educated  Patient    Methods  Explanation;Demonstration;Handout    Comprehension  Verbalized understanding;Returned demonstration;Need further instruction       PT Short Term Goals - 07/05/17 1708      PT SHORT TERM GOAL #1   Title  Indpendent with initial HEP    Status  Achieved      PT SHORT TERM GOAL #2   Title  Pt will verbalize understanding of neutral cervical spine and shoulder posture to reduce cervical muscle strain    Status  Achieved        PT Long Term Goals - 06/14/17 1615      PT LONG TERM GOAL #1   Title  Independent with ongoing HEP +/- gym program    Status  On-going      PT LONG TERM GOAL #2   Title  Pt will routinely demonstrate neutral spine and shoulder posture at least 75% of the time w/o cues    Status  On-going      PT LONG TERM GOAL #3   Title  Pt will verbalize understanding of/demonstrate proper body mechanics with typical daily tasks to reduce cervical strain    Status  On-going      PT LONG TERM GOAL #4   Title  Cervical ROM WFL w/o increased pain to allow pt to safely check blindspot while driving    Status  On-going      PT LONG TERM GOAL #5   Title  Pt will report at least 50% reduction in neck pain to allow for imporved activity and sleeping tolerance    Status  On-going            Plan - 07/05/17 1708    Clinical Impression Statement  Brittany Hodges report decreasing pain since start ot  PT along with improved sleeping tolerance. Denies any issues with HEP and able to progress rows/extension to green TB resistance. Pt reporting improving awareness of posture with better self-correction. All STG's now met. Good tolerance for  exercise porgression today including transition to standing scapular strengthening exercises using doorframe for tactile cueing for scapular input as well as introduction of prone I's, T's, W's & Y's over green Pball with cues for awareness of neutral spine posture - HEP updated accordingly.    Rehab Potential  Good    PT Treatment/Interventions  Patient/family education;ADLs/Self Care Home Management;Neuromuscular re-education;Therapeutic exercise;Therapeutic activities;Functional mobility training;Manual techniques;Dry needling;Taping;Passive range of motion;Electrical Stimulation;Moist Heat;Iontophoresis 87m/ml Dexamethasone;Cryotherapy;Traction    Consulted and Agree with Plan of Care  Patient       Patient will benefit from skilled therapeutic intervention in order to improve the following deficits and impairments:  Pain, Impaired flexibility, Decreased range of motion, Increased muscle spasms, Postural dysfunction, Improper body mechanics, Decreased activity tolerance  Visit Diagnosis: Cervicalgia  Abnormal posture  Cramp and spasm     Problem List Patient Active Problem List   Diagnosis Date Noted  . Asthma 04/06/2016  . Laryngopharyngeal reflux (LPR) 04/06/2016  . PCP NOTES >>>>> 03/09/2015  . Annual physical exam 12/21/2014  . Cardiomegaly, by MRI 03/16/2014  . Leukopenia 03/10/2014  . Lightheadedness 02/10/2011  . SINOATRIAL NODE DYSFUNCTION 08/06/2008  . ATRIAL SEPTAL DEFECT 06/29/2007  . Hypothyroidism 03/21/2007  . Essential hypertension 03/21/2007  . Allergic rhinitis 03/21/2007  . GERD-- dysphagia 03/21/2007  . CONSTIPATION 03/21/2007  . IRRITABLE BOWEL SYNDROME 03/21/2007  . RA (rheumatoid arthritis) ([place] 03/21/2007  . LOW BACK PAIN 03/21/2007  . FIBROMYALGIA 03/21/2007    JPercival Spanish PT, MPT 07/05/2017, 6:02 PM  [facility] [place] [address]Phone: [phone]   Fax:  [phone] Name: Brittany BROWNSTEINMRN: [id number] of Birth: 703/15/1967

## 2017-07-06 ENCOUNTER — Ambulatory Visit: Payer: BLUE CROSS/BLUE SHIELD | Admitting: Pulmonary Disease

## 2017-07-06 ENCOUNTER — Encounter: Payer: Self-pay | Admitting: Pulmonary Disease

## 2017-07-06 VITALS — BP 118/82 | HR 56 | Temp 98.2°F | Ht 68.0 in | Wt 131.8 lb

## 2017-07-06 DIAGNOSIS — J4520 Mild intermittent asthma, uncomplicated: Secondary | ICD-10-CM

## 2017-07-06 DIAGNOSIS — Z20828 Contact with and (suspected) exposure to other viral communicable diseases: Secondary | ICD-10-CM

## 2017-07-06 DIAGNOSIS — K219 Gastro-esophageal reflux disease without esophagitis: Secondary | ICD-10-CM

## 2017-07-06 MED ORDER — OSELTAMIVIR PHOSPHATE 75 MG PO CAPS: 75.0000 mg | ORAL_CAPSULE | Freq: Every day | ORAL | 0 refills | Status: DC

## 2017-07-06 MED ORDER — PREDNISONE 5 MG (21) PO TBPK: ORAL_TABLET | ORAL | 0 refills | Status: DC

## 2017-07-06 NOTE — Patient Instructions (Signed)
Today we updated your med list in our EPIC system...    Continue your current medications the same...  We wrote for the Flu preventive medication- TAMIFLU 75mg  tabs one daily x 10d...  We also wrote for a Prednisone doseapk to take as directed on the pack for your airway symptoms...  Drink plenty of fluids...  Call for any questions.Marland KitchenMarland Kitchen

## 2017-07-06 NOTE — Progress Notes (Signed)
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 (VITAMIN D) 1000 UNITS CAPSULE    Take 1,000 Units by mouth daily.     FLUTICASONE (FLONASE) 50 MCG/ACT NASAL SPRAY    Place 2 sprays into both nostrils daily as needed.   GINGER, ZINGIBER OFFICINALIS, (GINGER ROOT PO)    Take by mouth.   MISC NATURAL PRODUCTS (TART CHERRY ADVANCED PO)    Take by mouth.   MULTIPLE VITAMINS-IRON (CHLORELLA PO)    Take 1 tablet by mouth daily.   SODIUM CHLORIDE (V-R NASAL SPRAY SALINE) 0.65 % NASAL SPRAY    Place into the nose. As directed   SPACER/AERO-HOLDING CHAMBERS (AEROCHAMBER MV) INHALER    Use as instructed   TURMERIC PO    Take by mouth.   UNABLE TO FIND    fulvic minerals  Modified Medications   No medications on file  Discontinued Medications   MELOXICAM (MOBIC) 7.5 MG TABLET    Take 1 tablet (7.5 mg total) by mouth daily.   METHOCARBAMOL (ROBAXIN) 500 MG TABLET    Take 1 tablet (500 mg total) by mouth 2 (two) times daily.

## 2017-07-08 ENCOUNTER — Ambulatory Visit: Payer: BLUE CROSS/BLUE SHIELD

## 2017-07-08 DIAGNOSIS — R252 Cramp and spasm: Secondary | ICD-10-CM

## 2017-07-08 DIAGNOSIS — M542 Cervicalgia: Secondary | ICD-10-CM

## 2017-07-08 DIAGNOSIS — R293 Abnormal posture: Secondary | ICD-10-CM

## 2017-07-08 NOTE — Therapy (Signed)
[facility] [address]  [place], [place], [address] Phone: [phone]   Fax:  [phone]  Physical Therapy Treatment  Patient Details  Name: Brittany Hodges MRN: [id number] Date of Birth: 06-Apr-1966 Referring Provider: Jessy Oto, MD   Encounter Date: 07/08/2017  PT End of Session - 07/08/17 1708    Visit Number  6    Number of Visits  12    Date for PT Re-Evaluation  07/23/17    Authorization Type  BCBS    Authorization - Number of Visits  30    PT Start Time  1703    PT Stop Time  5465    PT Time Calculation (min)  45 min    Activity Tolerance  Patient tolerated treatment well    Behavior During Therapy  [facility] for tasks assessed/performed       Past Medical History:  Diagnosis Date  . Allergic rhinitis, cause unspecified   . Anemia   . Asthma   . Atrial septal defect    s/p repair;   Echo 08/07/10: EF 55-60%; mild MR; atrial septal aneurysm; no residual ASD  . Chest pain, unspecified    cardiac cath 07/31/10: normal coronaries; vigorous LVF  . Esophageal reflux   . Fibromyalgia   . Headache(784.0)   . Hematuria, unspecified   . Hypertension    no meds   . Hypothyroidism   . Irritable bowel syndrome   . Low back pain   . Rheumatoid arthritis(714.0)    Dr Herold Harms  . Scoliosis   . Sinoatrial node dysfunction (HCC)    eval. for chronotropic competence completed in past    Past Surgical History:  Procedure Laterality Date  . ASD REPAIR  1985  . TONSILLECTOMY AND ADENOIDECTOMY    . TUBAL LIGATION      There were no vitals filed for this visit.  Subjective Assessment - 07/08/17 1706    Subjective  Pt. reporting she is sleeping better now waking less with pain.      Diagnostic tests  03/08/17 Cervical MRI:  Straightening and kyphotic curvature of the cervical spine. Degenerative disc disease from C3-4 through C5-6. At the C3-4 level, there is edema within the endplate marrow, finding  that can be associated with neck pain. There is narrowing of the ventral subarachnoid space at C3-4, C4-5 and C5-6, but no cord compression. There is no significant foraminal narrowing noted in the region.    Patient Stated Goals  "relieve some of the pain"    Currently in Pain?  Yes    Pain Score  3     Pain Location  Neck    Pain Orientation  Lower    Pain Descriptors / Indicators  Aching;Throbbing    Pain Type  Chronic pain    Pain Radiating Towards  B upper shoulders     Pain Onset  More than a month ago    Aggravating Factors   checking blind spot occasionally    Multiple Pain Sites  No                      [facility] Adult PT Treatment/Exercise - 07/08/17 1711      Neck Exercises: Machines for Strengthening   UBE (Upper Arm Bike)  L 3.0 fwd/back x 3' each    Cybex Row  15# x 20 narrow grip    Other Machines for Strengthening  Serratus roll with orange p-ball on  wall x 15 rpes     Other Machines for Strengthening  BATCA pushup plus 15# x 15 reps       Neck Exercises: Theraband   Shoulder Extension  15 reps;Green    Shoulder Extension Limitations  standing    Rows  15 reps;Green    Rows Limitations  standing      Neck Exercises: Prone   W Back  15 reps    Shoulder Extension  15 reps    Shoulder Extension Limitations  I's over green p-ball     Other Prone Exercise  T's & Y's over green Pball x 15 each; cues for neutral spine      Manual Therapy   Manual Therapy  Soft tissue mobilization;Myofascial release;Passive ROM    Manual therapy comments  pt hooklying    Soft tissue mobilization  B cervical paraspinals, scalenes, UT, LS & SCM    Myofascial Release  TPR R UT; pt. reporting relief following this     Passive ROM  manual stretches for B UT, LS & scalenes               PT Short Term Goals - 07/05/17 1708      PT SHORT TERM GOAL #1   Title  Indpendent with initial HEP    Status  Achieved      PT SHORT TERM GOAL #2   Title  Pt will verbalize  understanding of neutral cervical spine and shoulder posture to reduce cervical muscle strain    Status  Achieved        PT Long Term Goals - 06/14/17 1615      PT LONG TERM GOAL #1   Title  Independent with ongoing HEP +/- gym program    Status  On-going      PT LONG TERM GOAL #2   Title  Pt will routinely demonstrate neutral spine and shoulder posture at least 75% of the time w/o cues    Status  On-going      PT LONG TERM GOAL #3   Title  Pt will verbalize understanding of/demonstrate proper body mechanics with typical daily tasks to reduce cervical strain    Status  On-going      PT LONG TERM GOAL #4   Title  Cervical ROM WFL w/o increased pain to allow pt to safely check blindspot while driving    Status  On-going      PT LONG TERM GOAL #5   Title  Pt will report at least 50% reduction in neck pain to allow for imporved activity and sleeping tolerance    Status  On-going            Plan - 07/08/17 1721    Clinical Impression Statement  Pt. reporting she is now sleeping better waking less often with pain.  Reports HEP update is going well and denies need to review.  Tolerated progression of prone on p-ball scapular strengthening activities well today.  Did require cueing to avoid excessive back extension with prone activities today.  Some tension and reported pain in B cervical paraspinals and R UT today, which responded well to STM/TPR.  Progressing well toward goals.    PT Treatment/Interventions  Patient/family education;ADLs/Self Care Home Management;Neuromuscular re-education;Therapeutic exercise;Therapeutic activities;Functional mobility training;Manual techniques;Dry needling;Taping;Passive range of motion;Electrical Stimulation;Moist Heat;Iontophoresis 4mg /ml Dexamethasone;Cryotherapy;Traction    Consulted and Agree with Plan of Care  Patient       Patient will benefit from skilled therapeutic intervention in order to  improve the following deficits and  impairments:  Pain, Impaired flexibility, Decreased range of motion, Increased muscle spasms, Postural dysfunction, Improper body mechanics, Decreased activity tolerance  Visit Diagnosis: Cervicalgia  Abnormal posture  Cramp and spasm     Problem List Patient Active Problem List   Diagnosis Date Noted  . Asthma 04/06/2016  . Laryngopharyngeal reflux (LPR) 04/06/2016  . PCP NOTES >>>>> 03/09/2015  . Annual physical exam 12/21/2014  . Cardiomegaly, by MRI 03/16/2014  . Leukopenia 03/10/2014  . Lightheadedness 02/10/2011  . SINOATRIAL NODE DYSFUNCTION 08/06/2008  . ATRIAL SEPTAL DEFECT 06/29/2007  . Hypothyroidism 03/21/2007  . Essential hypertension 03/21/2007  . Allergic rhinitis 03/21/2007  . GERD-- dysphagia 03/21/2007  . CONSTIPATION 03/21/2007  . IRRITABLE BOWEL SYNDROME 03/21/2007  . RA (rheumatoid arthritis) ([place]) 03/21/2007  . LOW BACK PAIN 03/21/2007  . FIBROMYALGIA 03/21/2007    Bess Harvest, PTA 07/08/17 5:56 PM  [facility] [address]  [place], [place], [address] Phone: [phone]   Fax:  [phone]  Name: Brittany Hodges MRN: [id number] Date of Birth: [birth year]

## 2017-07-12 ENCOUNTER — Ambulatory Visit: Payer: BLUE CROSS/BLUE SHIELD

## 2017-07-12 DIAGNOSIS — M542 Cervicalgia: Secondary | ICD-10-CM | POA: Diagnosis not present

## 2017-07-12 DIAGNOSIS — R293 Abnormal posture: Secondary | ICD-10-CM

## 2017-07-12 DIAGNOSIS — R252 Cramp and spasm: Secondary | ICD-10-CM

## 2017-07-12 NOTE — Therapy (Addendum)
[facility] [address]  [place], [place], [address] Phone: [phone]   Fax:  [phone]  Physical Therapy Treatment  Patient Details  Name: Brittany Hodges MRN: [id number] Date of Birth: [birth year] Referring Provider: Jessy Oto, MD   Encounter Date: 07/12/2017  PT End of Session - 07/12/17 1622    Visit Number  7    Number of Visits  12    Date for PT Re-Evaluation  07/23/17    Authorization Type  BCBS    Authorization - Number of Visits  30    PT Start Time  1615    PT Stop Time  1657    PT Time Calculation (min)  42 min    Activity Tolerance  Patient tolerated treatment well    Behavior During Therapy  [facility] for tasks assessed/performed       Past Medical History:  Diagnosis Date  . Allergic rhinitis, cause unspecified   . Anemia   . Asthma   . Atrial septal defect    s/p repair;   Echo 08/07/10: EF 55-60%; mild MR; atrial septal aneurysm; no residual ASD  . Chest pain, unspecified    cardiac cath 07/31/10: normal coronaries; vigorous LVF  . Esophageal reflux   . Fibromyalgia   . Headache(784.0)   . Hematuria, unspecified   . Hypertension    no meds   . Hypothyroidism   . Irritable bowel syndrome   . Low back pain   . Rheumatoid arthritis(714.0)    Dr Herold Harms  . Scoliosis   . Sinoatrial node dysfunction (HCC)    eval. for chronotropic competence completed in past    Past Surgical History:  Procedure Laterality Date  . ASD REPAIR  1985  . TONSILLECTOMY AND ADENOIDECTOMY    . TUBAL LIGATION      There were no vitals filed for this visit.  Subjective Assessment - 07/12/17 1634    Subjective  Pt. reporting she feels limitation by neck motion is nearly gone while driving.  Notes ~ 70 % improvement in neck pain since starting therapy.      Diagnostic tests  03/08/17 Cervical MRI:  Straightening and kyphotic curvature of the cervical spine. Degenerative disc disease from C3-4  through C5-6. At the C3-4 level, there is edema within the endplate marrow, finding that can be associated with neck pain. There is narrowing of the ventral subarachnoid space at C3-4, C4-5 and C5-6, but no cord compression. There is no significant foraminal narrowing noted in the region.    Patient Stated Goals  "relieve some of the pain"    Currently in Pain?  No/denies    Pain Score  0-No pain up to 6/10 pain "laying on neck wrong while napping" yesterday.    Pain Location  Neck    Pain Orientation  Lower    Pain Descriptors / Indicators  Aching;Throbbing    Pain Type  Chronic pain    Multiple Pain Sites  No                      [facility] Adult PT Treatment/Exercise - 07/12/17 1626      Neck Exercises: Machines for Strengthening   UBE (Upper Arm Bike)  L 3.0 fwd/back x 3' each    Cybex Row  20# 2 x 10 reps  x 10 narrow grip, x 10 horizontal handles     Other Machines for Strengthening  BATCA pulldown 15# x  15 reps  Cues required for scap. retraction + depression       Neck Exercises: Standing   Upper Extremity D1  Flexion;Extension;10 reps;Theraband B UE    Theraband Level (UE D1)  Level 2 (Red)    Upper Extremity D2  Flexion;Extension;10 reps;Theraband B UE    Theraband Level (UE D2)  Level 2 (Red)    Other Standing Exercises  TRX row x 10 reps     Other Standing Exercises  "snow angel" on wall x 10 reps       Neck Exercises: Prone   Shoulder Extension  10 reps    Shoulder Extension Weights (lbs)  1    Shoulder Extension Limitations  I's over green p-ball     Other Prone Exercise  T's & Y's over green Pball x 10 each; cues for neutral spine with 1#    Other Prone Exercise  prone on elbows and knees with diagonal head turns x 10 reps       Manual Therapy   Manual Therapy  Passive ROM    Manual therapy comments  pt hooklying    Passive ROM  manual stretches for B UT, LS & scalenes x 30 sec each; pt. verbalizing she feels these stretches are helping her neck motion  while driving                PT Short Term Goals - 07/05/17 1708      PT SHORT TERM GOAL #1   Title  Indpendent with initial HEP    Status  Achieved      PT SHORT TERM GOAL #2   Title  Pt will verbalize understanding of neutral cervical spine and shoulder posture to reduce cervical muscle strain    Status  Achieved        PT Long Term Goals - 06/14/17 1615      PT LONG TERM GOAL #1   Title  Independent with ongoing HEP +/- gym program    Status  On-going      PT LONG TERM GOAL #2   Title  Pt will routinely demonstrate neutral spine and shoulder posture at least 75% of the time w/o cues    Status  On-going      PT LONG TERM GOAL #3   Title  Pt will verbalize understanding of/demonstrate proper body mechanics with typical daily tasks to reduce cervical strain    Status  On-going      PT LONG TERM GOAL #4   Title  Cervical ROM WFL w/o increased pain to allow pt to safely check blindspot while driving    Status  On-going      PT LONG TERM GOAL #5   Title  Pt will report at least 50% reduction in neck pain to allow for imporved activity and sleeping tolerance    Status  On-going            Plan - 07/12/17 1624    Clinical Impression Statement  Pt. reporting she felt good following last visit.  Feels "neck motion" is improving while driving with checking blind spot becoming easier.  Reports ~ 70% improvement in neck pain since starting therapy.  Tolerated addition of UE functional diagonal patterns with band resistance, TRX row, and prone cervical strengthening activities well today.  Progressing well toward goals.      PT Treatment/Interventions  Patient/family education;ADLs/Self Care Home Management;Neuromuscular re-education;Therapeutic exercise;Therapeutic activities;Functional mobility training;Manual techniques;Dry needling;Taping;Passive range of motion;Electrical Stimulation;Moist Heat;Iontophoresis 98m/ml Dexamethasone;Cryotherapy;Traction  Consulted and  Agree with Plan of Care  Patient       Patient will benefit from skilled therapeutic intervention in order to improve the following deficits and impairments:  Pain, Impaired flexibility, Decreased range of motion, Increased muscle spasms, Postural dysfunction, Improper body mechanics, Decreased activity tolerance  Visit Diagnosis: Cervicalgia  Abnormal posture  Cramp and spasm     Problem List Patient Active Problem List   Diagnosis Date Noted  . Asthma 04/06/2016  . Laryngopharyngeal reflux (LPR) 04/06/2016  . PCP NOTES >>>>> 03/09/2015  . Annual physical exam 12/21/2014  . Cardiomegaly, by MRI 03/16/2014  . Leukopenia 03/10/2014  . Lightheadedness 02/10/2011  . SINOATRIAL NODE DYSFUNCTION 08/06/2008  . ATRIAL SEPTAL DEFECT 06/29/2007  . Hypothyroidism 03/21/2007  . Essential hypertension 03/21/2007  . Allergic rhinitis 03/21/2007  . GERD-- dysphagia 03/21/2007  . CONSTIPATION 03/21/2007  . IRRITABLE BOWEL SYNDROME 03/21/2007  . RA (rheumatoid arthritis) ([place]) 03/21/2007  . LOW BACK PAIN 03/21/2007  . FIBROMYALGIA 03/21/2007    Bess Harvest, PTA 07/12/17 6:06 PM  [facility] [address]  [place], [place], [address] Phone: [phone]   Fax:  [phone]  Name: Brittany Hodges MRN: [id number] Date of Birth: 08-01-1965  PHYSICAL THERAPY DISCHARGE SUMMARY  Visits from Start of Care: 7   Current functional level related to goals / functional outcomes:   Unable to formally assess as pt failed to return for remaining visits in POC, but reporting 70% improvement in neck pain as of last visit on 07/12/17 (refer to above clinical impression for status as of last visit).   Remaining deficits:   As above.   Education / Equipment:   HEP, Training and development officer education Plan: Patient agrees to discharge.  Patient goals were partially met. Patient is being discharged due to not returning since the  last visit.  ?????    Percival Spanish, PT, MPT 09/03/17, 9:42 AM  [facility] [address]  [place], [place], [address] Phone: [phone]   Fax:  [phone]

## 2017-07-14 ENCOUNTER — Ambulatory Visit: Payer: BLUE CROSS/BLUE SHIELD

## 2017-07-15 ENCOUNTER — Ambulatory Visit: Payer: BLUE CROSS/BLUE SHIELD | Admitting: Physical Therapy

## 2017-08-09 ENCOUNTER — Other Ambulatory Visit (INDEPENDENT_AMBULATORY_CARE_PROVIDER_SITE_OTHER): Payer: BLUE CROSS/BLUE SHIELD

## 2017-08-09 DIAGNOSIS — E039 Hypothyroidism, unspecified: Secondary | ICD-10-CM | POA: Diagnosis not present

## 2017-08-09 LAB — T4, FREE: Free T4: 0.74 ng/dL (ref 0.60–1.60)

## 2017-08-09 LAB — TSH: TSH: 4.1 u[IU]/mL (ref 0.35–4.50)

## 2017-08-11 NOTE — Progress Notes (Signed)
Patient ID: Brittany Hodges, female   DOB: 13-Sep-1965, 52 y.o.   MRN: [id number]            Referring Physician: Larose Kells  Reason for Appointment:   follow-up thyroid    History of Present Illness:   Hypothyroidism was first diagnosed several years ago  History of her initial abnormal TSH is not available and appears to have been asymptomatic previously She had been on levothyroxine since at least 2012 The patient has been treated with  doses of levothyroxine ranging from 75/up to 125 g the last several years With starting thyroid supplementation she did not feel any different initially Also she usually does not feel any different with her dosage adjustments that have been done regularly  Patient says that in 2017 she was told during the hospitalization for syncope in [facility] that her thyroid level was too high, TSH apparently was 0.06 in 2017.  Recent history: When she was first seen in consultation she had stopped the levothyroxine on her own in 9/18 Subsequently even though her TSH was 15.9 she had felt better with her energy level, at the same time she was trying to eat healthier  Since the patient was reluctant to start back on levothyroxine she was seen in follow-up without any supplementation in 1/19 She was feeling fairly good on her TSH was 3.8  She continues to be off levothyroxine and does not feel any unusual fatigue, sleepiness or lethargy No cold intolerance She is trying to gain some weight and is being more active also  Patient's weight history is as follows:  Wt Readings from Last 3 Encounters:  08/12/17 134 lb (60.8 kg)  07/06/17 131 lb 12.8 oz (59.8 kg)  06/14/17 131 lb 9.6 oz (59.7 kg)    Thyroid function results have been as follows:  Lab Results  Component Value Date   TSH 4.10 08/09/2017   TSH 3.84 06/14/2017   TSH 15.94 (H) 03/22/2017   TSH 0.10 (L) 01/27/2017   FREET4 0.74 08/09/2017   FREET4 0.74 06/14/2017   FREET4 1.40 01/27/2017   T3FREE 3.7 06/14/2017   T3FREE 3.9 01/27/2017    Lab Results  Component Value Date   TSH 4.10 08/09/2017   TSH 3.84 06/14/2017   TSH 15.94 (H) 03/22/2017   TSH 0.10 (L) 01/27/2017   TSH 5.22 (H) 12/07/2016   TSH 1.75 07/27/2016   TSH 0.27 (L) 06/24/2016   TSH 0.06 (L) 01/27/2016   TSH 1.14 05/24/2015     Past Medical History:  Diagnosis Date  . Allergic rhinitis, cause unspecified   . Anemia   . Asthma   . Atrial septal defect    s/p repair;   Echo 08/07/10: EF 55-60%; mild MR; atrial septal aneurysm; no residual ASD  . Chest pain, unspecified    cardiac cath 07/31/10: normal coronaries; vigorous LVF  . Esophageal reflux   . Fibromyalgia   . Headache(784.0)   . Hematuria, unspecified   . Hypertension    no meds   . Hypothyroidism   . Irritable bowel syndrome   . Low back pain   . Rheumatoid arthritis(714.0)    Dr Herold Harms  . Scoliosis   . Sinoatrial node dysfunction (HCC)    eval. for chronotropic competence completed in past    Past Surgical History:  Procedure Laterality Date  . ASD REPAIR  1985  . TONSILLECTOMY AND ADENOIDECTOMY    . TUBAL LIGATION      Family History  Problem Relation Age  of Onset  . Hypertension Mother   . Heart disease Maternal Grandmother   . Breast cancer Other        aunt  . Diabetes Other        GM  . Colon cancer Neg Hx   . Thyroid disease Neg Hx     Social History:  reports that she has never smoked. She has never used smokeless tobacco. She reports that she does not drink alcohol or use drugs.  Allergies:  Allergies  Allergen Reactions  . Etanercept   . Methotrexate Derivatives     Caused her WBC to drop  . Pregabalin     Allergies as of 08/12/2017      Reactions   Etanercept    Methotrexate Derivatives    Caused her WBC to drop   Pregabalin       Medication List        Accurate as of 08/12/17  3:25 PM. Always use your most recent med list.          AEROCHAMBER MV inhaler Use as instructed     albuterol 108 (90 Base) MCG/ACT inhaler Commonly known as:  PROAIR HFA Inhale 2 puffs into the lungs every 6 (six) hours as needed. For shortness of breath and wheezing   amLODipine 5 MG tablet Commonly known as:  NORVASC Take 1 tablet (5 mg total) by mouth daily.   azelastine 0.1 % nasal spray Commonly known as:  ASTELIN Place 2 sprays into both nostrils at bedtime as needed for rhinitis. Use in each nostril as directed   fluticasone 50 MCG/ACT nasal spray Commonly known as:  FLONASE Place 2 sprays into both nostrils daily as needed.   GINGER ROOT PO Take by mouth.   TART CHERRY ADVANCED PO Take by mouth.   TURMERIC PO Take by mouth.   UNABLE TO FIND fulvic minerals   V-R NASAL SPRAY SALINE 0.65 % nasal spray Generic drug:  sodium chloride Place into the nose. As directed   Vitamin D 1000 units capsule Take 1,000 Units by mouth daily.          Review of Systems   Lab Results  Component Value Date   CHOL 142 07/27/2016   HDL 57.90 07/27/2016   LDLCALC 70 07/27/2016   TRIG 68.0 07/27/2016   CHOLHDL 2 07/27/2016    HYPERTENSION: She did not take her amlodipine today and blood pressure is higher        Examination:    BP (!) 150/94 (BP Location: Left Arm, Patient Position: Sitting, Cuff Size: Normal)   Pulse (!) 50   Ht 5\' 8"  (1.727 m)   Wt 134 lb (60.8 kg)   SpO2 98%   BMI 20.37 kg/m   Thyroid not palpable  Assessment:  HYPOTHYROIDISM, probably subclinical for several years  More recently without infarct supplementation months she feels subjectively very good and her TSH is stable in the upper normal range  PLAN:   Recheck thyroid levels annually with her PCP Does not need a thyroid supplementation unless she has progressive hypothyroidism  For her hypertension discussed that she will follow-up with her PCP She can take the missed dose of her amlodipine from this morning now and continue every morning from tomorrow  Elayne Snare 08/12/2017,  3:25 PM     Note: This office note was prepared with Dragon voice recognition system technology. Any transcriptional errors that result from this process are unintentional.  ADDENDUM:  Thyroid levels are normal now, she  can be observed without treatment for the next 4 months now Likely she has had recovery of her suppressed thyroid over time and may have had subclinical hypothyroidism all along  Lab on 08/09/2017  Component Date Value Ref Range Status  . Free T4 08/09/2017 0.74  0.60 - 1.60 ng/dL Final   Comment: Specimens from patients who are undergoing biotin therapy and /or ingesting biotin supplements may contain high levels of biotin.  The higher biotin concentration in these specimens interferes with this Free T4 assay.  Specimens that contain high levels  of biotin may cause false high results for this Free T4 assay.  Please interpret results in light of the total clinical presentation of the patient.    Marland Kitchen TSH 08/09/2017 4.10  0.35 - 4.50 uIU/mL Final

## 2017-08-12 ENCOUNTER — Encounter: Payer: Self-pay | Admitting: Endocrinology

## 2017-08-12 ENCOUNTER — Ambulatory Visit: Payer: BLUE CROSS/BLUE SHIELD | Admitting: Endocrinology

## 2017-08-12 ENCOUNTER — Ambulatory Visit: Payer: BLUE CROSS/BLUE SHIELD | Admitting: Internal Medicine

## 2017-08-12 VITALS — BP 150/94 | HR 50 | Ht 68.0 in | Wt 134.0 lb

## 2017-08-12 DIAGNOSIS — E039 Hypothyroidism, unspecified: Secondary | ICD-10-CM

## 2017-08-12 DIAGNOSIS — E038 Other specified hypothyroidism: Secondary | ICD-10-CM

## 2017-08-31 LAB — HM MAMMOGRAPHY

## 2017-09-24 ENCOUNTER — Ambulatory Visit: Payer: BLUE CROSS/BLUE SHIELD | Admitting: Physician Assistant

## 2017-09-24 ENCOUNTER — Encounter: Payer: Self-pay | Admitting: Physician Assistant

## 2017-09-24 VITALS — BP 159/86 | HR 59 | Resp 13 | Ht 69.0 in | Wt 132.0 lb

## 2017-09-24 DIAGNOSIS — Z87898 Personal history of other specified conditions: Secondary | ICD-10-CM

## 2017-09-24 DIAGNOSIS — M79671 Pain in right foot: Secondary | ICD-10-CM | POA: Diagnosis not present

## 2017-09-24 DIAGNOSIS — M19042 Primary osteoarthritis, left hand: Secondary | ICD-10-CM

## 2017-09-24 DIAGNOSIS — Z8739 Personal history of other diseases of the musculoskeletal system and connective tissue: Secondary | ICD-10-CM

## 2017-09-24 DIAGNOSIS — M19071 Primary osteoarthritis, right ankle and foot: Secondary | ICD-10-CM

## 2017-09-24 DIAGNOSIS — M19041 Primary osteoarthritis, right hand: Secondary | ICD-10-CM

## 2017-09-24 DIAGNOSIS — M0579 Rheumatoid arthritis with rheumatoid factor of multiple sites without organ or systems involvement: Secondary | ICD-10-CM

## 2017-09-24 DIAGNOSIS — Z8719 Personal history of other diseases of the digestive system: Secondary | ICD-10-CM

## 2017-09-24 DIAGNOSIS — M19072 Primary osteoarthritis, left ankle and foot: Secondary | ICD-10-CM

## 2017-09-24 DIAGNOSIS — Z79899 Other long term (current) drug therapy: Secondary | ICD-10-CM | POA: Diagnosis not present

## 2017-09-24 DIAGNOSIS — Z8639 Personal history of other endocrine, nutritional and metabolic disease: Secondary | ICD-10-CM

## 2017-09-24 DIAGNOSIS — Z8679 Personal history of other diseases of the circulatory system: Secondary | ICD-10-CM

## 2017-09-24 DIAGNOSIS — Z8669 Personal history of other diseases of the nervous system and sense organs: Secondary | ICD-10-CM | POA: Diagnosis not present

## 2017-09-24 DIAGNOSIS — D708 Other neutropenia: Secondary | ICD-10-CM

## 2017-09-24 DIAGNOSIS — M5030 Other cervical disc degeneration, unspecified cervical region: Secondary | ICD-10-CM

## 2017-09-24 MED ORDER — PREDNISONE 5 MG PO TABS: ORAL_TABLET | ORAL | 0 refills | Status: DC

## 2017-09-24 NOTE — Patient Instructions (Signed)
Natural anti-inflammatories  You can purchase these at [employer], [employer] or online.  . Turmeric (capsules)  . Ginger (ginger root or capsules)  . Omega 3 (Fish, flax seeds, chia seeds, walnuts, almonds)  . Tart cherry (dried or extract)   Patient should be under the care of a physician while taking these supplements. This may not be reproduced without the permission of Dr. Shaili Deveshwar.  

## 2017-09-24 NOTE — Progress Notes (Signed)
Office Visit Note  Patient: Brittany Hodges             Date of Birth: [birth year]           MRN: [id number]             PCP: Colon Branch, MD Referring: Colon Branch, MD Visit Date: 09/24/2017 Occupation: @GUAROCC @    Subjective:  Right foot pain  History of Present Illness: Brittany Hodges is a 52 y.o. female with history of seropositive rheumatoid arthritis, osteoarthritis, and DDD.  Patient has been off of Humira for 2 to 3 years and has not had any recent rheumatoid arthritis flares.  She states that on Wednesday she started having pain in her right foot.  She states the pain is progressively getting worse.  She was seen in urgent care yesterday and had x-rays that were unremarkable and was given a Toradol injection.  She has very minimal relief with Toradol injection.  She states that she did not have any injuries or falls.  She is denied any changes in activity.  She states that she is having pain, redness, and swelling in the lateral aspect of her right foot.  She states that she has no pain in her left foot at this time.  She denies any insect bites.  She denies any chills or fevers.  She states that she has been having pain at night and having to elevate and use a heating pad or ice.  She states that she took 2 Tylenol this morning which have provided very minimal relief.  She denies any prednisone tapers recently.  She denies a history of gout. She denies any other joint pain or joint swelling at this time.  She denies any joint stiffness.  She is reports that she continues to take natural anti-inflammatories.  She states that her neck is doing well with no stiffness.  She states she attended physical therapy which helped in the past.  She has been walking with a cane due to not being able to bear weight on her right foot.    Activities of Daily Living:  Patient reports morning stiffness for 0 minutes.   Patient Reports nocturnal pain.  Difficulty dressing/grooming:  Denies Difficulty climbing stairs: Reports Difficulty getting out of chair: Reports Difficulty using hands for taps, buttons, cutlery, and/or writing: Denies   Review of Systems  Constitutional: Negative for fatigue.  HENT: Negative for mouth sores, mouth dryness and nose dryness.   Eyes: Negative for pain, visual disturbance and dryness.  Respiratory: Negative for cough, hemoptysis, shortness of breath and difficulty breathing.   Cardiovascular: Negative for chest pain, palpitations, hypertension and swelling in legs/feet.  Gastrointestinal: Negative for blood in stool, constipation and diarrhea.  Endocrine: Negative for increased urination.  Genitourinary: Negative for painful urination.  Musculoskeletal: Positive for arthralgias, joint pain and joint swelling. Negative for myalgias, muscle weakness, morning stiffness, muscle tenderness and myalgias.  Skin: Negative for color change, pallor, rash, hair loss, nodules/bumps, skin tightness, ulcers and sensitivity to sunlight.  Allergic/Immunologic: Negative for susceptible to infections.  Neurological: Negative for dizziness, numbness, headaches and weakness.  Hematological: Negative for swollen glands.  Psychiatric/Behavioral: Negative for depressed mood and sleep disturbance. The patient is not nervous/anxious.     PMFS History:  Patient Active Problem List   Diagnosis Date Noted  . Asthma 04/06/2016  . Laryngopharyngeal reflux (LPR) 04/06/2016  . PCP NOTES >>>>> 03/09/2015  . Annual physical exam 12/21/2014  . Cardiomegaly, by  MRI 03/16/2014  . Leukopenia 03/10/2014  . Lightheadedness 02/10/2011  . SINOATRIAL NODE DYSFUNCTION 08/06/2008  . ATRIAL SEPTAL DEFECT 06/29/2007  . Hypothyroidism 03/21/2007  . Essential hypertension 03/21/2007  . Allergic rhinitis 03/21/2007  . GERD-- dysphagia 03/21/2007  . CONSTIPATION 03/21/2007  . IRRITABLE BOWEL SYNDROME 03/21/2007  . RA (rheumatoid arthritis) ([place]) 03/21/2007  . LOW BACK  PAIN 03/21/2007  . FIBROMYALGIA 03/21/2007    Past Medical History:  Diagnosis Date  . Allergic rhinitis, cause unspecified   . Anemia   . Asthma   . Atrial septal defect    s/p repair;   Echo 08/07/10: EF 55-60%; mild MR; atrial septal aneurysm; no residual ASD  . Chest pain, unspecified    cardiac cath 07/31/10: normal coronaries; vigorous LVF  . Esophageal reflux   . Fibromyalgia   . Headache(784.0)   . Hematuria, unspecified   . Hypertension    no meds   . Hypothyroidism   . Irritable bowel syndrome   . Low back pain   . Rheumatoid arthritis(714.0)    Dr Herold Harms  . Scoliosis   . Sinoatrial node dysfunction (HCC)    eval. for chronotropic competence completed in past    Family History  Problem Relation Age of Onset  . Hypertension Mother   . Diabetes Mother   . Heart disease Maternal Grandmother   . Glaucoma Father   . Breast cancer Other        aunt  . Diabetes Other        GM  . Hypertension Sister   . Thyroid disease Sister   . Hypertension Sister   . Thyroid disease Sister   . Colon cancer Neg Hx    Past Surgical History:  Procedure Laterality Date  . ASD REPAIR  1985  . TONSILLECTOMY AND ADENOIDECTOMY    . TUBAL LIGATION     Social History   Social History Narrative   Household-- pt and youngest son      Objective: Vital Signs: BP (!) 159/86 (BP Location: Right Arm, Patient Position: Sitting, Cuff Size: Normal)   Pulse (!) 59   Resp 13   Ht 5\' 9"  (1.753 m)   Wt 132 lb (59.9 kg)   BMI 19.49 kg/m    Physical Exam  Constitutional: She is oriented to person, place, and time. She appears well-developed and well-nourished.  HENT:  Head: Normocephalic and atraumatic.  Eyes: Conjunctivae and EOM are normal.  Neck: Normal range of motion.  Cardiovascular: Normal rate, regular rhythm, normal heart sounds and intact distal pulses.  Pulmonary/Chest: Effort normal and breath sounds normal.  Abdominal: Soft. Bowel sounds are normal.   Lymphadenopathy:    She has no cervical adenopathy.  Neurological: She is alert and oriented to person, place, and time.  Skin: Skin is warm and dry. Capillary refill takes less than 2 seconds.  Psychiatric: She has a normal mood and affect. Her behavior is normal.  Nursing note and vitals reviewed.    Musculoskeletal Exam: C-spine, thoracic spine, lumbar spine good range of motion.  No midline spinal tenderness.  No SI joint tenderness.  Shoulder joints, elbow joints, wrist joints, MCPs, PIPs, DIPs good range of motion with no synovitis.  Hip joints, knee joints, ankle joints, MTPs, PIPs, DIPs good range of motion with no synovitis.  No warmth or effusion of knee joints.  No tenderness of trochanteric bursitis.  She has tenderness at the base of the right 5th metatarsal.  She has warmth, swelling, and erythema in  this region.  CDAI Exam: No CDAI exam completed.    Investigation: No additional findings. CBC Latest Ref Rng & Units 01/27/2017 04/15/2016 07/26/2013  WBC 4.0 - 10.5 K/uL 2.6(L) 3.9 2.8(L)  Hemoglobin 12.0 - 15.0 g/dL 12.7 12.4 11.2(L)  Hematocrit 36.0 - 46.0 % 40.4 38.3 35.5  Platelets 150.0 - 400.0 K/uL 138.0(L) 189 199   CMP Latest Ref Rng & Units 01/27/2017 04/15/2016 03/20/2013  Glucose 70 - 99 mg/dL 82 101(H) 81  BUN 6 - 23 mg/dL 11 13 11   Creatinine 0.40 - 1.20 mg/dL 0.79 0.90 0.8  Sodium 135 - 145 mEq/L 139 140 138  Potassium 3.5 - 5.1 mEq/L 3.9 4.3 4.0  Chloride 96 - 112 mEq/L 103 102 109  CO2 19 - 32 mEq/L 29 22 23   Calcium 8.4 - 10.5 mg/dL 10.7(H) 9.8 9.7  Total Protein 6.0 - 8.3 g/dL 8.7(H) 8.4 8.1  Total Bilirubin 0.2 - 1.2 mg/dL 0.5 0.2 0.8  Alkaline Phos 39 - 117 U/L 65 62 43  AST 0 - 37 U/L 30 26 29   ALT 0 - 35 U/L 20 16 16     Imaging: No results found.  Speciality Comments: No specialty comments available.    Procedures:  No procedures performed Allergies: Etanercept; Methotrexate derivatives; and Pregabalin   Assessment / Plan:     Visit  Diagnoses: Rheumatoid arthritis involving multiple sites with positive rheumatoid factor +CCP ([place]): She has no active synovitis.  She has not had a rheumatoid arthritis flares in several years.  She has been off of Humira for about 2 years.  She takes natural anti-inflammatories.  A list of natural anti-inflammatories provided today.  She is having pain in her right foot that started on Wednesday.  She has warmth erythema and swelling at the base of the fifth metatarsal.  She had x-ray at urgent care yesterday that was unremarkable.  She was given a Toradol injection yesterday which provided minimal relief.  She will be started on a prednisone taper starting at 20 mg tapering by 5 mg every 4 days.  A prescription for prednisone was sent to the pharmacy today.  She will return if her symptoms do not resolve.  We will repeat the x-ray to rule out a stress fracture if her pain does not subside.  She is not having any other joint pain or joint swelling at this time.  High risk medication use -She is been off of Humira for about 2 years. She developed low WBC with Methotrexate and injection site reaction to Enbrel in the past  Pain in right foot: She developed pain, swelling, and redness at the base of her fifth metatarsal starting on Wednesday.  She was seen in urgent care yesterday and had an x-ray that was unremarkable.  She was given a Toradol injection at urgent care which provided minimal relief.  On exam today she is very point tender at the base of the fifth metatarsal.  She has warmth erythema and swelling in this region as well.  She was given a prednisone taper starting at 20 mg tapering by 5 mg every 4 days.  She was instructed to return if her pain and swelling does not improve.  We will x-ray her foot again to [place] rule out a stress fracture at that time.  Primary osteoarthritis of both hands: Mild PIP and DIP synovial thickening consistent with osteoarthritis.   Primary osteoarthritis of both feet:  Mild PIP and DIP synovial thickening consistent with zoster arthritis of bilateral  feet.  DDD (degenerative disc disease), cervical: She has good ROM with no discomfort.    History of scoliosis  Other neutropenia (HCC)  History of hypertension  History of hypothyroidism  History of migraine  History of gastroesophageal reflux (GERD)  History of bradycardia/ sinoatrial node dysfunction   History of IBS    Orders: No orders of the defined types were placed in this encounter.  Meds ordered this encounter  Medications  . predniSONE (DELTASONE) 5 MG tablet    Sig: Take 4 tablets by mouth for 4 days, 3 tablets for 4 days, 2 tablets for 4 days, 1 tablet for 4 days.    Dispense:  40 tablet    Refill:  0    Face-to-face time spent with patient was 30 minutes. >50% of time was spent in counseling and coordination of care.  Follow-Up Instructions: Return if symptoms worsen or fail to improve, for Rheumatoid arthritis, Osteoarthritis.   Ofilia Neas, PA-C   I examined and evaluated the patient with Hazel Sams PA.  On my exam patient had tenderness over the base of right fifth metatarsal.  We will reevaluate her x-ray if she has persistent pain.  Assuming that this is rheumatoid arthritis flare we will give her prednisone taper for right now.  The plan of care was discussed as noted above.  Bo Merino, MD  Note - This record has been created using Editor, commissioning.  Chart creation errors have been sought, but may not always  have been located. Such creation errors do not reflect on  the standard of medical care.

## 2017-09-25 LAB — HM PAP SMEAR

## 2017-09-27 ENCOUNTER — Encounter: Payer: Self-pay | Admitting: Internal Medicine

## 2017-09-27 ENCOUNTER — Ambulatory Visit: Payer: BLUE CROSS/BLUE SHIELD | Admitting: Internal Medicine

## 2017-09-27 VITALS — BP 162/81 | HR 36 | Resp 15 | Ht 69.0 in | Wt 128.0 lb

## 2017-09-27 DIAGNOSIS — R001 Bradycardia, unspecified: Secondary | ICD-10-CM | POA: Diagnosis not present

## 2017-09-27 NOTE — Progress Notes (Signed)
Patient Care Team: Colon Branch, MD as PCP - General (Internal Medicine) Deboraha Sprang, MD as Consulting Physician (Cardiology) Bo Merino, MD as Consulting Physician (Rheumatology) Senaida Ores, MD as Referring Physician (Obstetrics and Gynecology)   HPI  Brittany Hodges is a 52 y.o. female is seen in followup for junctional rhythm in the setting of atrial septal defect repair. In the past she has had assessments of chrontropic competence which have been adequate.  She does have mild limitations but has problems with exercise.   Hospitalized in the past for syncope.  Brought by [facility] EP and by me subsequently to probably be neurally mediated.   She continues to exercise regularly.  She notes no change in exercise tolerance.  BP not controlled but she is not taking it regularly  Date Cr K  11/18/ 0.79 3.9  /         Past Medical History:  Diagnosis Date  . Allergic rhinitis, cause unspecified   . Anemia   . Asthma   . Atrial septal defect    s/p repair;   Echo 08/07/10: EF 55-60%; mild MR; atrial septal aneurysm; no residual ASD  . Chest pain, unspecified    cardiac cath 07/31/10: normal coronaries; vigorous LVF  . Esophageal reflux   . Fibromyalgia   . Headache(784.0)   . Hematuria, unspecified   . Hypertension    no meds   . Hypothyroidism   . Irritable bowel syndrome   . Low back pain   . Rheumatoid arthritis(714.0)    Dr Herold Harms  . Scoliosis   . Sinoatrial node dysfunction (HCC)    eval. for chronotropic competence completed in past    Past Surgical History:  Procedure Laterality Date  . ASD REPAIR  1985  . TONSILLECTOMY AND ADENOIDECTOMY    . TUBAL LIGATION      Current Outpatient Medications  Medication Sig Dispense Refill  . albuterol (PROAIR HFA) 108 (90 Base) MCG/ACT inhaler Inhale 2 puffs into the lungs every 6 (six) hours as needed. For shortness of breath and wheezing 1 Inhaler 6  . amLODipine (NORVASC) 5 MG tablet  Take 1 tablet (5 mg total) by mouth daily. 90 tablet 1  . azelastine (ASTELIN) 0.1 % nasal spray Place 2 sprays into both nostrils at bedtime as needed for rhinitis. Use in each nostril as directed 30 mL 3  . Cholecalciferol (VITAMIN D) 1000 UNITS capsule Take 1,000 Units by mouth daily.      . fluticasone (FLONASE) 50 MCG/ACT nasal spray Place 2 sprays into both nostrils daily as needed.    . Ginger, Zingiber officinalis, (GINGER ROOT PO) Take by mouth.    . Misc Natural Products (TART CHERRY ADVANCED PO) Take by mouth.    . predniSONE (DELTASONE) 5 MG tablet Take 4 tablets by mouth for 4 days, 3 tablets for 4 days, 2 tablets for 4 days, 1 tablet for 4 days. 40 tablet 0  . sodium chloride (V-R NASAL SPRAY SALINE) 0.65 % nasal spray Place into the nose as needed. As directed    . Spacer/Aero-Holding Chambers (AEROCHAMBER MV) inhaler Use as instructed 1 each 0  . TURMERIC PO Take by mouth.    Marland Kitchen UNABLE TO FIND fulvic minerals     No current facility-administered medications for this visit.     Allergies  Allergen Reactions  . Etanercept   . Methotrexate Derivatives     Caused her WBC to drop  . Pregabalin  Review of Systems negative except from HPI and PMH  Physical Exam BP (!) 162/81   Pulse (!) 36   Resp 15   Ht 5\' 9"  (1.753 m)   Wt 128 lb (58.1 kg)   SpO2 98%   BMI 18.90 kg/m  Well developed and nourished in no acute distress HENT normal Neck supple with JVP-flat Clear Slow and regular rate and rhythm, no murmurs or gallops Abd-soft with active BS No Clubbing cyanosis edema Skin-warm and dry A & Oriented  Grossly normal sensory and motor function  ECG sinus 36   Assessment and  Plan  Sinus node dysfunction  Atrial septal defect repair   Hypertension  She has no symptoms associated with her sinus bradycardia.  Heart rates are little bit slower than they have been in the recent past but no different from what they had been in the more remote past.  She is  reminded to let us know if she has symptoms of lightheadedness.  She also has poorly controlled blood pressure.  She has been noncompliant with her amlodipine.  We discussed the long-term potential implications of uncontrolled poorly controlled blood pressure.  She will work on compliance  We spent more than 50% of our >25 min visit in face to face counseling regarding the above   With

## 2017-09-27 NOTE — Patient Instructions (Signed)

## 2017-09-29 ENCOUNTER — Telehealth: Payer: Self-pay | Admitting: Rheumatology

## 2017-09-29 NOTE — Telephone Encounter (Signed)
Ok to provide work note for 1 more week off work.  Please advise her to return if her pain continues.  We will need to repeat X-ray and possibly order MRI if her symptoms persist.

## 2017-09-29 NOTE — Telephone Encounter (Signed)
Patient calling in reference to foot. Per patient foot is better, but hurts when she is walking on it. Patient wants to know if she should stay out of work another week. Please call to advise.

## 2017-09-30 NOTE — Telephone Encounter (Signed)
Ok to fill out [employer] paperwork.

## 2017-09-30 NOTE — Telephone Encounter (Signed)
Patient states with her going to be out another week, she will need FMLA paperwork filled out to cover her time off and protect her job. Patient wants to know if we would be able to fill that out for her to cover her fr next week. Please advise.

## 2017-10-01 NOTE — Telephone Encounter (Signed)
Patient advised we will be able to fill out FMLA paperwork for the two weeks only. Patient verbalized understanding.

## 2017-10-04 ENCOUNTER — Telehealth: Payer: Self-pay | Admitting: Rheumatology

## 2017-10-04 ENCOUNTER — Encounter: Payer: Self-pay | Admitting: *Deleted

## 2017-10-04 NOTE — Telephone Encounter (Signed)
Patient requesting another note to cover this week out of work. Patient to return to work Monday 10/11/17. Patient would like to pick this note up tomorrow. 10/05/17. Please call to let patient know when ready to pick up.

## 2017-10-04 NOTE — Telephone Encounter (Signed)
Patient advised she may come by the office to pick up letter.

## 2017-11-12 ENCOUNTER — Telehealth: Payer: Self-pay | Admitting: Internal Medicine

## 2017-11-12 NOTE — Telephone Encounter (Signed)
Pt returned call to Christine--pls call [phone]

## 2017-11-12 NOTE — Telephone Encounter (Signed)
Spoke with pt who is reporting she has been experiencing right sided sharp chest discomfort with radiation into right shoulder blade.  This comes and goes lasting approximately 15 to 20 seconds.  She notes she has been belching more frequently and has had some nausea (in general not necessarily with pain) but no vomiting.  Some SOB with eating but not with activity.  No active s/s at this time.  BP today 123/78.  Pt is also concerned that her HR has been staying under 50 bpm when it is generally above 50.  Advised I will forward to Dr Caryl Comes for review and further recommendations.  Pt aware I will c/b.

## 2017-11-12 NOTE — Telephone Encounter (Signed)
NEW MESSAGE   Please call patient at [phone]  1. Are you having CP right now? NO  2. Are you experiencing any other symptoms (ex. SOB, nausea, vomiting, sweating)? NAUSEA, JAW PAIN, BACK PAIN   3. How long have you been experiencing CP? 1 WEEK 4. Is your CP continuous or coming and going? COMING AND GOING  5. Have you taken Nitroglycerin? NO    ?

## 2017-11-12 NOTE — Telephone Encounter (Signed)
Lm to call back ./cy 

## 2017-11-12 NOTE — Telephone Encounter (Signed)
Reviewed information with Dr Caryl Comes who states pt's HR is generally in the 80 and 76s.  She should report if she becomes symptomatic with these lower heart rates but generally tolerates them well.   She is aware he recommends her follow up with GI or PCP for further evaluation of chest ddiscomfort/belching.  She states understanding and was grateful for the information.

## 2017-11-30 ENCOUNTER — Encounter: Payer: Self-pay | Admitting: Internal Medicine

## 2017-11-30 ENCOUNTER — Ambulatory Visit (INDEPENDENT_AMBULATORY_CARE_PROVIDER_SITE_OTHER): Payer: BLUE CROSS/BLUE SHIELD | Admitting: Internal Medicine

## 2017-11-30 VITALS — BP 128/78 | HR 52 | Temp 98.1°F | Resp 16 | Ht 68.5 in | Wt 132.8 lb

## 2017-11-30 DIAGNOSIS — Z0000 Encounter for general adult medical examination without abnormal findings: Secondary | ICD-10-CM | POA: Diagnosis not present

## 2017-11-30 DIAGNOSIS — R739 Hyperglycemia, unspecified: Secondary | ICD-10-CM

## 2017-11-30 DIAGNOSIS — D72819 Decreased white blood cell count, unspecified: Secondary | ICD-10-CM

## 2017-11-30 DIAGNOSIS — E039 Hypothyroidism, unspecified: Secondary | ICD-10-CM | POA: Diagnosis not present

## 2017-11-30 NOTE — Progress Notes (Signed)
Subjective:    Patient ID: Brittany Hodges, female    DOB: Dec 12, 1965, 52 y.o.   MRN: [id number]  DOS:  11/30/2017 Type of visit - description : cpx Interval history: Since the last visit she is doing well. BP was elevated back in May, she is trying to increase her physical activity and BPs now are better   Review of Systems Had a problem with the left eye yesterday, it was very red, went to see her eye doctor, was Rx eyedrops and is better    Other than above, a 14 point review of systems is negative     Past Medical History:  Diagnosis Date  . Allergic rhinitis, cause unspecified   . Anemia   . Asthma   . Atrial septal defect    s/p repair;   Echo 08/07/10: EF 55-60%; mild MR; atrial septal aneurysm; no residual ASD  . Chest pain, unspecified    cardiac cath 07/31/10: normal coronaries; vigorous LVF  . Esophageal reflux   . Fibromyalgia   . Headache(784.0)   . Hematuria, unspecified   . Hypertension    no meds   . Hypothyroidism   . Irritable bowel syndrome   . Low back pain   . Rheumatoid arthritis(714.0)    Dr Herold Harms  . Scoliosis   . Sinoatrial node dysfunction (HCC)    eval. for chronotropic competence completed in past    Past Surgical History:  Procedure Laterality Date  . ASD REPAIR  1985  . TONSILLECTOMY AND ADENOIDECTOMY    . TUBAL LIGATION      Social History   Socioeconomic History  . Marital status: Single    Spouse name: Not on file  . Number of children: 3  . Years of education: Not on file  . Highest education level: Not on file  Occupational History  . 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 Financial resource strain: Not on file  . Food insecurity:    Worry: Not on file    Inability: Not on file  . Transportation needs:    Medical: Not on file    Non-medical: Not on file  Tobacco Use  . Smoking status: Never Smoker  . Smokeless tobacco: Never Used  . Tobacco comment: never used tobacco    Substance and Sexual Activity  . Alcohol use: No  . Drug use: No  . Sexual activity: Not Currently  Lifestyle  . Physical activity:    Days per week: Not on file    Minutes per session: Not on file  . Stress: Not on file  Relationships  . Social connections:    Talks on phone: Not on file    Gets together: Not on file    Attends religious service: Not on file    Active member of club or organization: Not on file    Attends meetings of clubs or organizations: Not on file    Relationship status: Not on file  . Intimate partner violence:    Fear of current or ex partner: Not on file    Emotionally abused: Not on file    Physically abused: Not on file    Forced sexual activity: Not on file  Other Topics Concern  . Not on file  Social History Narrative   Household-- pt and youngest son      Family History  Problem Relation Age of Onset  . Hypertension Mother   . Diabetes Mother   . Heart  disease Maternal Grandmother   . Stomach cancer Maternal Grandmother 95  . Glaucoma Father   . Breast cancer Other        aunt  . Diabetes Other        GM  . Hypertension Sister   . Thyroid disease Sister   . Hypertension Sister   . Thyroid disease Sister   . Colon cancer Neg Hx      Allergies as of 11/30/2017      Reactions   Etanercept    Methotrexate Derivatives    Caused her WBC to drop   Pregabalin       Medication List        Accurate as of 11/30/17 11:59 PM. Always use your most recent med list.          AEROCHAMBER MV inhaler Use as instructed   albuterol 108 (90 Base) MCG/ACT inhaler Commonly known as:  PROAIR HFA Inhale 2 puffs into the lungs every 6 (six) hours as needed. For shortness of breath and wheezing   amLODipine 5 MG tablet Commonly known as:  NORVASC Take 1 tablet (5 mg total) by mouth daily.   azelastine 0.1 % nasal spray Commonly known as:  ASTELIN Place 2 sprays into both nostrils at bedtime as needed for rhinitis. Use in each nostril as  directed   fluticasone 50 MCG/ACT nasal spray Commonly known as:  FLONASE Place 2 sprays into both nostrils daily as needed.   GINGER ROOT PO Take by mouth.   HOMATROPINE HBR OP Place 1 drop into the left eye every morning.   prednisoLONE acetate 1 % ophthalmic suspension Commonly known as:  PRED FORTE Place 1 drop into the left eye every 2 (two) hours. Tapering down through 12/12/17   TART CHERRY ADVANCED PO Take by mouth.   TURMERIC PO Take by mouth.   UNABLE TO FIND fulvic minerals   V-R NASAL SPRAY SALINE 0.65 % nasal spray Generic drug:  sodium chloride Place into the nose as needed. As directed   Vitamin D 1000 units capsule Take 1,000 Units by mouth daily.          Objective:   Physical Exam BP 128/78 (BP Location: Right Arm, Cuff Size: Normal)   Pulse (!) 52   Temp 98.1 F (36.7 C) (Oral)   Resp 16   Ht 5' 8.5" (1.74 m)   Wt 132 lb 12.8 oz (60.2 kg)   SpO2 99%   BMI 19.90 kg/m  General: Well developed, NAD, see BMI.  Neck: No  thyromegaly  HEENT:  Normocephalic . Face symmetric, atraumatic Conjunctiva slightly red, left pupil (where she is putting drops) is slightly larger.  EOMI. Lungs:  CTA B Normal respiratory effort, no intercostal retractions, no accessory muscle use. Heart: bradycardic but regular, no murmur.  No pretibial edema bilaterally  Abdomen:  Not distended, soft, non-tender. No rebound or rigidity.   Skin: Exposed areas without rash. Not pale. Not jaundice Neurologic:  alert & oriented X3.  Speech normal, gait appropriate for age and unassisted Strength symmetric and appropriate for age.  Psych: Cognition and judgment appear intact.  Cooperative with normal attention span and concentration.  Behavior appropriate. No anxious or depressed appearing.     Assessment & Plan:    Assessment > Prediabetes: A1c 6.0 (01-2017) HTN Hypothyroidism GERD- dysphagia -Dr. Norville Haggard EGD from 04/02/2014 (Dr Delrae Alfred): Narrowing of proximal  esophagus, question of extrinsic extrinsic compression, EUS showing no intrinsic esophageal mass but the spine was seen in  close proximity to the esophagus probably resulting in extrinsic compression. The endoscopist recommend repeat CT neck and chest in 3 months.saw ENT 04-2015 for dysphagia,had a scope,  rx PPI Asthma, uses alb rarely  CV: --Atrial septal defect: Status post repair, no residual ASD --Sinoatrial  Dysfunction, sees Dr Caryl Comes   --CP: cath 2012, normal coronaries MSK: --Rheumatoid arthritis,   Dr. Herold Harms + rheumatoid factor +CCP , has use  Humira before, h/o  low WBC with Methotrexate, and injection site reaction to Enbrel   --Fibromyalgia IBS (Miralax prn constipation)   Admission: syncope 04-2016 (unlikely to be arrhythmia related per cards )  PLAN:  Hyperglycemia: A1c 6.0 (01/2017).  Recheck HTN: On amlodipine, back in May BP was elevated in the 130s, 160s (was noncompliant with amlodipine).  Doing better with exercise, in the last few weeks BPs are 120s when checked at home.  No change. Hypothyroidism: saw endo 07/2017, "probably subclinical hyperthyroidism for years".  Was not recommended supplements. Asthma: Controlled, hardly ever uses inhaler CV: Saw cardiology few weeks ago, noted to be bradycardic but asymptomatic. R.A.: sees Rheumatology periodically.  Currently on no specific medications and doing well except for red eye, apparently she had iritis, saw ophthalmology yesterday, better today. RTC 1 year

## 2017-11-30 NOTE — Patient Instructions (Signed)
GO TO THE LAB : Get the blood work     GO TO THE FRONT DESK Schedule your next appointment for a physical exam in 1 year   Check the  blood pressure 2 or 3 times a month   Be sure your blood pressure is between 110/65 and  135/85. If it is consistently higher or lower, let me know

## 2017-11-30 NOTE — Assessment & Plan Note (Addendum)
Td 2012, Pneumovax   2016; prevnar 01-2016 ; shingrix - rec to d/w rheumatology Female care: [facility] gynecology in [facility]. Had a  MMG 2019 per pt  CCS: Cscope  05/07/2011 negative, 10 years, Dr. Shana Chute  diet and exercise:   Started to exercises  2 weeks ago, praised Labs : CMP, FLP, CBC, A1c, TSH, T4

## 2017-12-01 LAB — CBC WITH DIFFERENTIAL/PLATELET
Basophils Absolute: 0 10*3/uL (ref 0.0–0.1)
Basophils Relative: 0.7 % (ref 0.0–3.0)
Eosinophils Absolute: 0 10*3/uL (ref 0.0–0.7)
Eosinophils Relative: 1.2 % (ref 0.0–5.0)
HCT: 36 % (ref 36.0–46.0)
Hemoglobin: 11.7 g/dL — ABNORMAL LOW (ref 12.0–15.0)
Lymphocytes Relative: 41.8 % (ref 12.0–46.0)
Lymphs Abs: 1.2 10*3/uL (ref 0.7–4.0)
MCHC: 32.5 g/dL (ref 30.0–36.0)
MCV: 88.7 fl (ref 78.0–100.0)
Monocytes Absolute: 0.6 10*3/uL (ref 0.1–1.0)
Monocytes Relative: 19.8 % — ABNORMAL HIGH (ref 3.0–12.0)
Neutro Abs: 1.1 10*3/uL — ABNORMAL LOW (ref 1.4–7.7)
Neutrophils Relative %: 36.5 % — ABNORMAL LOW (ref 43.0–77.0)
Platelets: 133 10*3/uL — ABNORMAL LOW (ref 150.0–400.0)
RBC: 4.06 Mil/uL (ref 3.87–5.11)
RDW: 13.3 % (ref 11.5–15.5)
WBC: 2.9 10*3/uL — ABNORMAL LOW (ref 4.0–10.5)

## 2017-12-01 LAB — COMPREHENSIVE METABOLIC PANEL
ALT: 13 U/L (ref 0–35)
AST: 20 U/L (ref 0–37)
Albumin: 4.1 g/dL (ref 3.5–5.2)
Alkaline Phosphatase: 60 U/L (ref 39–117)
BUN: 8 mg/dL (ref 6–23)
CO2: 29 mEq/L (ref 19–32)
Calcium: 10 mg/dL (ref 8.4–10.5)
Chloride: 106 mEq/L (ref 96–112)
Creatinine, Ser: 0.94 mg/dL (ref 0.40–1.20)
GFR: 79.8 mL/min (ref >60.00)
Glucose, Bld: 102 mg/dL — ABNORMAL HIGH (ref 70–99)
Potassium: 4.3 mEq/L (ref 3.5–5.1)
Sodium: 142 mEq/L (ref 135–145)
Total Bilirubin: 0.4 mg/dL (ref 0.2–1.2)
Total Protein: 7.6 g/dL (ref 6.0–8.3)

## 2017-12-01 LAB — HEMOGLOBIN A1C: Hgb A1c MFr Bld: 6.1 % (ref 4.6–6.5)

## 2017-12-01 LAB — LIPID PANEL
Cholesterol: 120 mg/dL (ref 0–200)
HDL: 56.9 mg/dL (ref >39.00)
LDL Cholesterol: 52 mg/dL (ref 0–99)
NonHDL: 62.86
Total CHOL/HDL Ratio: 2
Triglycerides: 54 mg/dL (ref 0.0–149.0)
VLDL: 10.8 mg/dL (ref 0.0–40.0)

## 2017-12-01 LAB — TSH: TSH: 5.65 u[IU]/mL — ABNORMAL HIGH (ref 0.35–4.50)

## 2017-12-01 LAB — T4, FREE: Free T4: 0.85 ng/dL (ref 0.60–1.60)

## 2017-12-01 NOTE — Assessment & Plan Note (Signed)
Hyperglycemia: A1c 6.0 (01/2017).  Recheck HTN: On amlodipine, back in May BP was elevated in the 130s, 160s (was noncompliant with amlodipine).  Doing better with exercise, in the last few weeks BPs are 120s when checked at home.  No change. Hypothyroidism: saw endo 07/2017, "probably subclinical hyperthyroidism for years".  Was not recommended supplements. Asthma: Controlled, hardly ever uses inhaler CV: Saw cardiology few weeks ago, noted to be bradycardic but asymptomatic. R.A.: sees Rheumatology periodically.  Currently on no specific medications and doing well except for red eye, apparently she had iritis, saw ophthalmology yesterday, better today. RTC 1 year

## 2017-12-02 ENCOUNTER — Encounter: Payer: Self-pay | Admitting: Internal Medicine

## 2017-12-02 NOTE — Addendum Note (Signed)
Addended byDamita Dunnings D on: 12/02/2017 05:27 PM   Modules accepted: Orders

## 2017-12-19 ENCOUNTER — Other Ambulatory Visit: Payer: Self-pay | Admitting: Internal Medicine

## 2018-02-16 NOTE — Progress Notes (Signed)
Office Visit Note  Patient: Brittany Hodges             Date of Birth: Jan 22, 1966           MRN: [id number]             PCP: Colon Branch, MD Referring: Colon Branch, MD Visit Date: 03/02/2018 Occupation: @GUAROCC @  Subjective:  Eye pain and redness bilaterally   History of Present Illness: Brittany Hodges is a 52 y.o. female with history of seropostiive rheumatoid arthritis, DDD, and osteoarthritis.  Patient has been off of Humira for 5 years plus.  She reports that she has intermittent minor flares in her hands.  She states that about 2 weeks ago she had a flare in bilateral hands.  She states that she develops achiness and occasional swelling.  She reports that she has been having neck stiffness but denies any symptoms of radiculopathy at this time.  She denies any right foot pain or swelling.  She states that after taking prednisone to resolve her symptoms.  She has not had any pain in the right foot since.  She states that a few weeks ago she hit her right hip on something and developed achiness.  She states that she has increased discomfort when she is exercising.  She reports she has good range of motion.  She denies any bruising.  She states the pain is slowly been improving.  She denies using ice or over-the-counter Rx for pain relief.  She reports that she has been having flares of iritis in bilateral eyes.  She states she is currently having a flare in her right eye.  She continues to see Dr. Alain Honey.  She states that she uses prednisone eyedrops on a regular basis.  She states her last follow-up visit with him was 3 months ago and she was advised to follow-up with [place].  She reports that stress seems to be a trigger of iritis.   Activities of Daily Living:  Patient reports morning stiffness for 0 none.   Patient Denies nocturnal pain.  Difficulty dressing/grooming: Denies Difficulty climbing stairs: Denies Difficulty getting out of chair: Denies Difficulty using hands  for taps, buttons, cutlery, and/or writing: Denies  Review of Systems  Constitutional: Negative for fatigue.  HENT: Negative for mouth sores, trouble swallowing, trouble swallowing, mouth dryness and nose dryness.   Eyes: Positive for pain and redness. Negative for visual disturbance and dryness.  Respiratory: Negative for cough, hemoptysis, shortness of breath and difficulty breathing.   Cardiovascular: Negative for chest pain, palpitations, hypertension and swelling in legs/feet.  Gastrointestinal: Negative for blood in stool, constipation and diarrhea.  Endocrine: Negative for cold intolerance and increased urination.  Genitourinary: Negative for difficulty urinating and painful urination.  Musculoskeletal: Positive for arthralgias, joint pain and muscle tenderness. Negative for joint swelling, myalgias, muscle weakness, morning stiffness and myalgias.  Skin: Negative for color change, pallor, rash, hair loss, nodules/bumps, skin tightness, ulcers and sensitivity to sunlight.  Allergic/Immunologic: Negative for susceptible to infections.  Neurological: Negative for dizziness, numbness, headaches and weakness.  Hematological: Negative for bruising/bleeding tendency and swollen glands.  Psychiatric/Behavioral: Negative for depressed mood and sleep disturbance. The patient is not nervous/anxious.     PMFS History:  Patient Active Problem List   Diagnosis Date Noted  . Asthma 04/06/2016  . Laryngopharyngeal reflux (LPR) 04/06/2016  . PCP NOTES >>>>> 03/09/2015  . Annual physical exam 12/21/2014  . Cardiomegaly, by MRI 03/16/2014  . Leukopenia 03/10/2014  .  Lightheadedness 02/10/2011  . SINOATRIAL NODE DYSFUNCTION 08/06/2008  . ATRIAL SEPTAL DEFECT 06/29/2007  . Hypothyroidism 03/21/2007  . Essential hypertension 03/21/2007  . Allergic rhinitis 03/21/2007  . GERD-- dysphagia 03/21/2007  . CONSTIPATION 03/21/2007  . IRRITABLE BOWEL SYNDROME 03/21/2007  . RA (rheumatoid arthritis)  ([place]) 03/21/2007  . LOW BACK PAIN 03/21/2007  . FIBROMYALGIA 03/21/2007    Past Medical History:  Diagnosis Date  . Allergic rhinitis, cause unspecified   . Anemia   . Asthma   . Atrial septal defect    s/p repair;   Echo 08/07/10: EF 55-60%; mild MR; atrial septal aneurysm; no residual ASD  . Chest pain, unspecified    cardiac cath 07/31/10: normal coronaries; vigorous LVF  . Esophageal reflux   . Fibromyalgia   . Headache(784.0)   . Hematuria, unspecified   . Hypertension    no meds   . Hypothyroidism   . Irritable bowel syndrome   . Low back pain   . Rheumatoid arthritis(714.0)    Dr Herold Harms  . Scoliosis   . Sinoatrial node dysfunction (HCC)    eval. for chronotropic competence completed in past    Family History  Problem Relation Age of Onset  . Hypertension Mother   . Diabetes Mother   . Heart disease Maternal Grandmother   . Stomach cancer Maternal Grandmother 95  . Glaucoma Father   . Breast cancer Other        aunt  . Diabetes Other        GM  . Hypertension Sister   . Thyroid disease Sister   . Hypertension Sister   . Thyroid disease Sister   . Colon cancer Neg Hx    Past Surgical History:  Procedure Laterality Date  . ASD REPAIR  1985  . TONSILLECTOMY AND ADENOIDECTOMY    . TUBAL LIGATION     Social History   Social History Narrative   Household-- pt and youngest son     Objective: Vital Signs: BP 108/60 (BP Location: Left Arm, Patient Position: Sitting, Cuff Size: Normal)   Pulse (!) 48   Resp 16   Ht 5\' 9"  (1.753 m)   Wt 130 lb 12.8 oz (59.3 kg)   BMI 19.32 kg/m    Physical Exam  Constitutional: She is oriented to person, place, and time. She appears well-developed and well-nourished.  HENT:  Head: Normocephalic and atraumatic.  Eyes: EOM are normal.  Conjunctival erythema bilaterally  Neck: Normal range of motion.  Cardiovascular: Normal rate, regular rhythm, normal heart sounds and intact distal pulses.  Pulmonary/Chest: Effort  normal and breath sounds normal.  Abdominal: Soft. Bowel sounds are normal.  Lymphadenopathy:    She has no cervical adenopathy.  Neurological: She is alert and oriented to person, place, and time.  Skin: Skin is warm and dry. Capillary refill takes less than 2 seconds.  Psychiatric: She has a normal mood and affect. Her behavior is normal.  Nursing note and vitals reviewed.    Musculoskeletal Exam: C-spine good ROM with some discomfort. Thoracic spine  and lumbar spine good ROM.  No midline spinal tenderness. No SI joint tenderness.  Shoulder joints, elbow joints, wrist joints, MCPs, PIPs, and DIPs good ROM with no synovitis.  Hip joints, knee joints, ankle joints, MTPs, PIPs, DIPs good range of motion with no synovitis.  No warmth or swelling of the knee joints.  No tenderness or swelling of ankle joints.  No tenderness of MTPs.  She has tenderness of the right trochanteric  bursa and right hip flexor insertion site.  CDAI Exam: CDAI Score: 0.8  Patient Global Assessment: 4 (mm); Provider Global Assessment: 4 (mm) Swollen: 0 ; Tender: 0  Joint Exam   Not documented   There is currently no information documented on the homunculus. Go to the Rheumatology activity and complete the homunculus joint exam.  Investigation: No additional findings.  Imaging: No results found.  Recent Labs: Lab Results  Component Value Date   WBC 2.9 (L) 11/30/2017   HGB 11.7 (L) 11/30/2017   PLT 133.0 (L) 11/30/2017   NA 142 11/30/2017   K 4.3 11/30/2017   CL 106 11/30/2017   CO2 29 11/30/2017   GLUCOSE 102 (H) 11/30/2017   BUN 8 11/30/2017   CREATININE 0.94 11/30/2017   BILITOT 0.4 11/30/2017   ALKPHOS 60 11/30/2017   AST 20 11/30/2017   ALT 13 11/30/2017   PROT 7.6 11/30/2017   ALBUMIN 4.1 11/30/2017   CALCIUM 10.0 11/30/2017   GFRAA 85 04/15/2016    Speciality Comments: No specialty comments available.  Procedures:  No procedures performed Allergies: Etanercept; Methotrexate  derivatives; and Pregabalin   Assessment / Plan:     Visit Diagnoses: Rheumatoid arthritis involving multiple sites with positive rheumatoid factor +CCP ([place]): She has no synovitis on exam.  She has been off of Humira for 5+ years.  She has been taking natural anti-inflammatories including tumeric, tart cherry, and ginger.  She is apprehensive to start on any immunosuppressive medications at this time.  She has intermittent flares in bilateral hands.  Her last flare was 2 weeks ago and resolved on its own. She takes Advil very sparingly. She was advised to notify us if she has increased joint pain or joint swelling.  She will be following up in 2 weeks to discuss treatment for iritis and intermittent RA flares.   High risk medication use - She has been off of Humira for about 5+years. She developed low WBC with Methotrexate and injection site reaction to Enbrel in the past.   Iritis: She reports for the past 1 year she has been having intermittent flares of iritis in bilateral eyes.  She currently is having a flare in the right eye.  She has conjunctival erythema, dryness, and right eye pain at this time.  She denies any photophobia.  She sees Dr. Alain Honey on a regular basis.  Her last appointment was about 3 months ago.  She has been using several different eyedrops.  We will call his office tomorrow to obtain records.  She will follow-up on October 30 with Dr. Estanislado Pandy to discuss possibly having to restart on immunosuppressive agent.  Primary osteoarthritis of both hands: She has PIP and DIP synovial thickening consistent with osteoarthritis of bilateral hands.  She has no synovitis on exam.  She is complete fist formation bilaterally.  Joint protection and muscle strengthening were discussed  Primary osteoarthritis of both feet: She has osteoarthritic changes in bilateral feet.  She has no discomfort at this time.  Several months ago she was having right foot pain which resolved after taking  prednisone.  She has not had any flares since then.  She wears proper fitting shoes.  DDD (degenerative disc disease), cervical: She has good range of motion with some discomfort.  She has no symptoms of radiculopathy at this time.  Trochanter bursitis, right hip: She has tenderness of the right trochanteric bursa as well as the insertion site of the right hip flexor.  Several weeks ago she  hit her right hip on something and continues to have some achiness.  She has good range of motion of her right hip on exam.  No ecchymosis or erythema.  She is advised to use moist heat as well as a muscle roller.  She is advised to notify us if she develops new or worsening symptoms.  She will be following up in 2 weeks and we will reassess at that time.  History of scoliosis: She has no back discomfort at this time.  Osteoporosis screening: She has not had a bone density scan performed.  We will schedule a DEXA scan due to history of rheumatoid arthritis and intermittent prednisone use.  Other medical conditions are listed as follows:  Other neutropenia (HCC)  History of hypertension  History of hypothyroidism  History of migraine  History of gastroesophageal reflux (GERD)  History of bradycardia/ sinoatrial node dysfunction   History of IBS   Orders: Orders Placed This Encounter  Procedures  . DG BONE DENSITY (DXA)   No orders of the defined types were placed in this encounter.     Follow-Up Instructions: Return for Rheumatoid arthritis, Osteoarthritis, DDD.   Ofilia Neas, PA-C  Note - This record has been created using Dragon software.  Chart creation errors have been sought, but may not always  have been located. Such creation errors do not reflect on  the standard of medical care.

## 2018-03-01 ENCOUNTER — Ambulatory Visit: Payer: BLUE CROSS/BLUE SHIELD | Admitting: Rheumatology

## 2018-03-02 ENCOUNTER — Ambulatory Visit: Payer: BLUE CROSS/BLUE SHIELD | Admitting: Physician Assistant

## 2018-03-02 ENCOUNTER — Encounter (INDEPENDENT_AMBULATORY_CARE_PROVIDER_SITE_OTHER): Payer: Self-pay

## 2018-03-02 ENCOUNTER — Encounter: Payer: Self-pay | Admitting: Physician Assistant

## 2018-03-02 VITALS — BP 108/60 | HR 48 | Resp 16 | Ht 69.0 in | Wt 130.8 lb

## 2018-03-02 DIAGNOSIS — Z13820 Encounter for screening for osteoporosis: Secondary | ICD-10-CM

## 2018-03-02 DIAGNOSIS — M0579 Rheumatoid arthritis with rheumatoid factor of multiple sites without organ or systems involvement: Secondary | ICD-10-CM

## 2018-03-02 DIAGNOSIS — Z87898 Personal history of other specified conditions: Secondary | ICD-10-CM

## 2018-03-02 DIAGNOSIS — Z8719 Personal history of other diseases of the digestive system: Secondary | ICD-10-CM

## 2018-03-02 DIAGNOSIS — M5030 Other cervical disc degeneration, unspecified cervical region: Secondary | ICD-10-CM

## 2018-03-02 DIAGNOSIS — M19072 Primary osteoarthritis, left ankle and foot: Secondary | ICD-10-CM

## 2018-03-02 DIAGNOSIS — Z79899 Other long term (current) drug therapy: Secondary | ICD-10-CM

## 2018-03-02 DIAGNOSIS — Z8669 Personal history of other diseases of the nervous system and sense organs: Secondary | ICD-10-CM

## 2018-03-02 DIAGNOSIS — M19042 Primary osteoarthritis, left hand: Secondary | ICD-10-CM

## 2018-03-02 DIAGNOSIS — Z8679 Personal history of other diseases of the circulatory system: Secondary | ICD-10-CM

## 2018-03-02 DIAGNOSIS — D708 Other neutropenia: Secondary | ICD-10-CM

## 2018-03-02 DIAGNOSIS — M19041 Primary osteoarthritis, right hand: Secondary | ICD-10-CM

## 2018-03-02 DIAGNOSIS — H209 Unspecified iridocyclitis: Secondary | ICD-10-CM | POA: Diagnosis not present

## 2018-03-02 DIAGNOSIS — Z8639 Personal history of other endocrine, nutritional and metabolic disease: Secondary | ICD-10-CM

## 2018-03-02 DIAGNOSIS — M7061 Trochanteric bursitis, right hip: Secondary | ICD-10-CM

## 2018-03-02 DIAGNOSIS — Z8739 Personal history of other diseases of the musculoskeletal system and connective tissue: Secondary | ICD-10-CM

## 2018-03-02 DIAGNOSIS — M19071 Primary osteoarthritis, right ankle and foot: Secondary | ICD-10-CM

## 2018-03-02 NOTE — Patient Instructions (Signed)
Supplements for Osteoarthritis Natural anti-inflammatories can help reduce inflammation and joint stiffness without some of the harmful side effects non-steroidal anti-inflammatories (Advil, Motrin, Aleve, etc.)  Tumeric . Recommended dose 400 mg to 600 mg once daily (can cause stomach upset, may increase to three times daily as tolerated) . Do not take if you are on a blood thinner and stop prior to surgery  Ginger (root or capsules) . Recommended dose 2 grams in three divided doses or 4 cups of tea daily . Do not take if you are on a blood thinner and stop prior to surgery  Fish oil or Omega 3 . Two 3 ounce servings of fish a week or flaxseed, chia seeds, walnuts and almonds . Capsules: 2-3 grams twice daily; make sure it contains at least 30% of EPA/DHA  Tart Cherry (dried or extract or tablets) . Recommended dose 500 mg twice daily  You may be able to find some of these products at your local pharmacy but may also purchase at [employer], [employer], other specialty stores or online.  *Although these are natural products they can still interact with medications.  Always consult with your doctor or pharmacist when starting new supplements and medications*  Patient should be under the care of a physician while taking these supplements. This may not be reproduced without the permission of Dr. Shaili Deveshwar.  

## 2018-03-03 ENCOUNTER — Telehealth: Payer: Self-pay

## 2018-03-03 NOTE — Telephone Encounter (Signed)
I called Dr. Kathlene November office to request records for patient reported iritis. They will fax the records over today.

## 2018-03-07 NOTE — Progress Notes (Signed)
Office Visit Note  Patient: Brittany Hodges             Date of Birth: [birth year]           MRN: [id number]             PCP: Colon Branch, MD Referring: Colon Branch, MD Visit Date: 03/16/2018 Occupation: @GUAROCC @  Subjective:  Left eye pain and redness   History of Present Illness: Brittany Hodges is a 52 y.o. female with history of seropositive rheumatoid arthritis, osteoarthritis, DDD, and iritis.  She presents today with left eye pain, redness, and eye dryness.  She states she had a stressful day at work which she attributes to causing the iritis flare.  She has not been using prednisone drops.  She has not seen Dr. Sabra Heck since the summer.  She denies any symptoms in the right eye at this time.  She denies any joint pain or joint swelling at this time.  She denies any joint stiffness.  She continues to take tart cherry and ginger.  She continues to run and works out at [employer] on a regular basis.     Activities of Daily Living:  Patient reports morning stiffness for 0 minutes.   Patient Denies nocturnal pain.  Difficulty dressing/grooming: Denies Difficulty climbing stairs: Denies Difficulty getting out of chair: Denies Difficulty using hands for taps, buttons, cutlery, and/or writing: Denies  Review of Systems  Constitutional: Negative for fatigue.  HENT: Negative for mouth sores, mouth dryness and nose dryness.   Eyes: Positive for pain, redness and dryness. Negative for visual disturbance.  Respiratory: Negative for cough, hemoptysis, shortness of breath and difficulty breathing.   Cardiovascular: Negative for chest pain, palpitations, hypertension and swelling in legs/feet.  Gastrointestinal: Negative for blood in stool, constipation and diarrhea.  Endocrine: Negative for increased urination.  Genitourinary: Negative for painful urination.  Musculoskeletal: Positive for myalgias and myalgias. Negative for arthralgias, joint pain, joint swelling, muscle weakness,  morning stiffness and muscle tenderness.  Skin: Negative for color change, pallor, rash, hair loss, nodules/bumps, skin tightness, ulcers and sensitivity to sunlight.  Allergic/Immunologic: Negative for susceptible to infections.  Neurological: Negative for dizziness, numbness, headaches and weakness.  Hematological: Negative for swollen glands.  Psychiatric/Behavioral: Negative for depressed mood and sleep disturbance. The patient is not nervous/anxious.     PMFS History:  Patient Active Problem List   Diagnosis Date Noted  . Asthma 04/06/2016  . Laryngopharyngeal reflux (LPR) 04/06/2016  . PCP NOTES >>>>> 03/09/2015  . Annual physical exam 12/21/2014  . Cardiomegaly, by MRI 03/16/2014  . Leukopenia 03/10/2014  . Lightheadedness 02/10/2011  . SINOATRIAL NODE DYSFUNCTION 08/06/2008  . ATRIAL SEPTAL DEFECT 06/29/2007  . Hypothyroidism 03/21/2007  . Essential hypertension 03/21/2007  . Allergic rhinitis 03/21/2007  . GERD-- dysphagia 03/21/2007  . CONSTIPATION 03/21/2007  . IRRITABLE BOWEL SYNDROME 03/21/2007  . RA (rheumatoid arthritis) ([place]) 03/21/2007  . LOW BACK PAIN 03/21/2007  . FIBROMYALGIA 03/21/2007    Past Medical History:  Diagnosis Date  . Allergic rhinitis, cause unspecified   . Anemia   . Asthma   . Atrial septal defect    s/p repair;   Echo 08/07/10: EF 55-60%; mild MR; atrial septal aneurysm; no residual ASD  . Chest pain, unspecified    cardiac cath 07/31/10: normal coronaries; vigorous LVF  . Esophageal reflux   . Fibromyalgia   . Headache(784.0)   . Hematuria, unspecified   . Hypertension    no meds   .  Hypothyroidism   . Irritable bowel syndrome   . Low back pain   . Rheumatoid arthritis(714.0)    Dr Herold Harms  . Scoliosis   . Sinoatrial node dysfunction (HCC)    eval. for chronotropic competence completed in past    Family History  Problem Relation Age of Onset  . Hypertension Mother   . Diabetes Mother   . Heart disease Maternal Grandmother    . Stomach cancer Maternal Grandmother 95  . Glaucoma Father   . Breast cancer Other        aunt  . Diabetes Other        GM  . Hypertension Sister   . Thyroid disease Sister   . Hypertension Sister   . Thyroid disease Sister   . Colon cancer Neg Hx    Past Surgical History:  Procedure Laterality Date  . ASD REPAIR  1985  . TONSILLECTOMY AND ADENOIDECTOMY    . TUBAL LIGATION     Social History   Social History Narrative   Household-- pt and youngest son     Objective: Vital Signs: BP 125/77 (BP Location: Left Arm, Patient Position: Sitting, Cuff Size: Normal)   Pulse (!) 44   Resp 14   Ht 5\' 9"  (1.753 m)   Wt 130 lb 12.8 oz (59.3 kg)   BMI 19.32 kg/m    Physical Exam  Constitutional: She is oriented to person, place, and time. She appears well-developed and well-nourished.  HENT:  Head: Normocephalic and atraumatic.  Eyes: Conjunctivae and EOM are normal.  Bilateral pterygium and conjunctival injection   Neck: Normal range of motion.  Cardiovascular: Normal rate, regular rhythm, normal heart sounds and intact distal pulses.  Pulmonary/Chest: Effort normal and breath sounds normal.  Abdominal: Soft. Bowel sounds are normal.  Lymphadenopathy:    She has no cervical adenopathy.  Neurological: She is alert and oriented to person, place, and time.  Skin: Skin is warm and dry. Capillary refill takes less than 2 seconds.  Psychiatric: She has a normal mood and affect. Her behavior is normal.  Nursing note and vitals reviewed.    Musculoskeletal Exam: C-spine, thoracic spine, and lumbar spine good ROM.  No midline spinal tenderness.  No SI joint tenderness.  Shoulder joints, elbow joints, wrist joints, MCPs, PIPs, and DIPs good ROM with no synovitis.  PIPs and DIPs good ROM with no synovitis. Hip joints, knee joints, ankle joints, MTPs, PIPs, and DIPs good ROM with no synovitis. No warmth or effusion of knee joints. No tenderness or swelling of ankle joints.   CDAI  Exam: CDAI Score: 0  Patient Global Assessment: 0 (mm); Provider Global Assessment: 0 (mm) Swollen: 0 ; Tender: 0  Joint Exam   Not documented   There is currently no information documented on the homunculus. Go to the Rheumatology activity and complete the homunculus joint exam.  Investigation: No additional findings.  Imaging: No results found.  Recent Labs: Lab Results  Component Value Date   WBC 2.9 (L) 11/30/2017   HGB 11.7 (L) 11/30/2017   PLT 133.0 (L) 11/30/2017   NA 142 11/30/2017   K 4.3 11/30/2017   CL 106 11/30/2017   CO2 29 11/30/2017   GLUCOSE 102 (H) 11/30/2017   BUN 8 11/30/2017   CREATININE 0.94 11/30/2017   BILITOT 0.4 11/30/2017   ALKPHOS 60 11/30/2017   AST 20 11/30/2017   ALT 13 11/30/2017   PROT 7.6 11/30/2017   ALBUMIN 4.1 11/30/2017   CALCIUM 10.0 11/30/2017  GFRAA 85 04/15/2016    Speciality Comments: No specialty comments available.  Procedures:  No procedures performed Allergies: Etanercept; Methotrexate derivatives; and Pregabalin   Assessment / Plan:     Visit Diagnoses: Rheumatoid arthritis involving multiple sites with positive rheumatoid factor +CCP ([place]): She has no synovitis on exam.  She has no joint pain or joint swelling. She has no joint stiffness.  She has not had any recent rheumatoid arthritis flares.  She has been off of MTX and Humira for several years.  She previously had leukopenia while on Humira and MTX and she experienced an injection site reaction while on Enbrel in the past.  She is resistant to restart on MTX due to potential side effects.  She has recurrent left eye iritis, so we discussed restarting her on MTX 4 tablets by mouth once weekly. Indications, contraindications, and potential side effects were discussed.  All questions were addressed.  Consent was obtained.  CBC and CMP were ordered today.  She will be started on Methotrexate 4 tablets by mouth once weekly and folic acid 1 mg by mouth daily.  She will  follow up in 3 months.  She will return for lab work in 1 month then every 3 months.   Drug Counseling TB Gold: 10/23/15 negative  Hepatitis panel: 03/31/2006 negative  Immunoglobulins: 03/13/13  Chest-xray:  CXR did not reveal active cardiopulmonary disease on 04/06/16.  Contraception: Post-menopausal   Alcohol use: She does not drink alcohol.   Patient was counseled on the purpose, proper use, and adverse effects of methotrexate including nausea, infection, and signs and symptoms of pneumonitis.  Reviewed instructions with patient to take methotrexate weekly along with folic acid daily.  Discussed the importance of frequent monitoring of kidney and liver function and blood counts, and provided patient with standing lab instructions.  Counseled patient to avoid NSAIDs and alcohol while on methotrexate.  Provided patient with educational materials on methotrexate and answered all questions.  Advised patient to get annual influenza vaccine and to get a pneumococcal vaccine if patient has not already had one.  Patient voiced understanding.  Patient consented to methotrexate use.  Will upload into chart.    Iritis - recurrent iritis, Dr. Teodoro Spray.  She is currently having a left eye iritis flare.  She has bilateral pterygiums and conjunctival injection on exam today. She will be started on MTX 4 tablets by mouth once weekly and folic acid 1 mg by mouth daily.   High risk medication use -She will be restarting on MTX. She was on Humira and MTX in the past. D/c due to low WBC count. inj site rxn to Enbrel. CBC and CMP were drawn today.  She will return for lab work in 1 month then every 3 months. - Plan: CBC with Differential/Platelet, COMPLETE METABOLIC PANEL WITH GFR  Primary osteoarthritis of both hands: She has PIP and DIP synovial thickening.  She has complete fist formation.  Joint protection and muscle strengthening discussed.   Primary osteoarthritis of both feet: She has osteoarthritic  changes in both feet.  She has no discomfort at this time.   DDD (degenerative disc disease), cervical: Good ROM with no discomfort. No symptoms of radiculopathy at this time.   Trochanteric bursitis, right hip: She has no tenderness at this time.   Other medical conditions are listed as follows:   History of scoliosis  Other neutropenia (HCC)  History of hypertension  History of hypothyroidism  History of migraine  History of gastroesophageal reflux (  GERD)  History of bradycardia/ sinoatrial node dysfunction   History of IBS   Orders: Orders Placed This Encounter  Procedures  . CBC with Differential/Platelet  . COMPLETE METABOLIC PANEL WITH GFR   No orders of the defined types were placed in this encounter.   Face-to-face time spent with patient was 30 minutes. Greater than 50% of time was spent in counseling and coordination of care.  Follow-Up Instructions: Return in about 3 months (around 06/16/2018) for Rheumatoid arthritis, Osteoarthritis, DDD.   Ofilia Neas, PA-C  Note - This record has been created using Dragon software.  Chart creation errors have been sought, but may not always  have been located. Such creation errors do not reflect on  the standard of medical care.

## 2018-03-16 ENCOUNTER — Ambulatory Visit: Payer: BLUE CROSS/BLUE SHIELD | Admitting: Physician Assistant

## 2018-03-16 ENCOUNTER — Encounter: Payer: Self-pay | Admitting: Physician Assistant

## 2018-03-16 VITALS — BP 125/77 | HR 44 | Resp 14 | Ht 69.0 in | Wt 130.8 lb

## 2018-03-16 DIAGNOSIS — Z8719 Personal history of other diseases of the digestive system: Secondary | ICD-10-CM

## 2018-03-16 DIAGNOSIS — H209 Unspecified iridocyclitis: Secondary | ICD-10-CM

## 2018-03-16 DIAGNOSIS — M19072 Primary osteoarthritis, left ankle and foot: Secondary | ICD-10-CM

## 2018-03-16 DIAGNOSIS — M19041 Primary osteoarthritis, right hand: Secondary | ICD-10-CM | POA: Diagnosis not present

## 2018-03-16 DIAGNOSIS — Z8739 Personal history of other diseases of the musculoskeletal system and connective tissue: Secondary | ICD-10-CM

## 2018-03-16 DIAGNOSIS — M19042 Primary osteoarthritis, left hand: Secondary | ICD-10-CM

## 2018-03-16 DIAGNOSIS — Z8639 Personal history of other endocrine, nutritional and metabolic disease: Secondary | ICD-10-CM

## 2018-03-16 DIAGNOSIS — Z79899 Other long term (current) drug therapy: Secondary | ICD-10-CM

## 2018-03-16 DIAGNOSIS — M0579 Rheumatoid arthritis with rheumatoid factor of multiple sites without organ or systems involvement: Secondary | ICD-10-CM | POA: Diagnosis not present

## 2018-03-16 DIAGNOSIS — D708 Other neutropenia: Secondary | ICD-10-CM

## 2018-03-16 DIAGNOSIS — Z87898 Personal history of other specified conditions: Secondary | ICD-10-CM

## 2018-03-16 DIAGNOSIS — M7061 Trochanteric bursitis, right hip: Secondary | ICD-10-CM

## 2018-03-16 DIAGNOSIS — M5030 Other cervical disc degeneration, unspecified cervical region: Secondary | ICD-10-CM

## 2018-03-16 DIAGNOSIS — Z8679 Personal history of other diseases of the circulatory system: Secondary | ICD-10-CM

## 2018-03-16 DIAGNOSIS — M19071 Primary osteoarthritis, right ankle and foot: Secondary | ICD-10-CM

## 2018-03-16 DIAGNOSIS — Z8669 Personal history of other diseases of the nervous system and sense organs: Secondary | ICD-10-CM

## 2018-03-16 LAB — COMPLETE METABOLIC PANEL WITH GFR
AG Ratio: 1.3 (calc) (ref 1.0–2.5)
ALT: 23 U/L (ref 6–29)
AST: 28 U/L (ref 10–35)
Albumin: 4.4 g/dL (ref 3.6–5.1)
Alkaline phosphatase (APISO): 67 U/L (ref 33–130)
BUN/Creatinine Ratio: 15 (calc) (ref 6–22)
BUN: 16 mg/dL (ref 7–25)
CO2: 29 mmol/L (ref 20–32)
Calcium: 10 mg/dL (ref 8.6–10.4)
Chloride: 106 mmol/L (ref 98–110)
Creat: 1.08 mg/dL — ABNORMAL HIGH (ref 0.50–1.05)
GFR, Est African American: 67 mL/min/{1.73_m2} (ref >=60)
GFR, Est Non African American: 58 mL/min/{1.73_m2} — ABNORMAL LOW (ref >=60)
Globulin: 3.5 g/dL (calc) (ref 1.9–3.7)
Glucose, Bld: 89 mg/dL (ref 65–99)
Potassium: 4.8 mmol/L (ref 3.5–5.3)
Sodium: 143 mmol/L (ref 135–146)
Total Bilirubin: 0.4 mg/dL (ref 0.2–1.2)
Total Protein: 7.9 g/dL (ref 6.1–8.1)

## 2018-03-16 LAB — CBC WITH DIFFERENTIAL/PLATELET
Basophils Absolute: 20 cells/uL (ref 0–200)
Basophils Relative: 0.6 %
Eosinophils Absolute: 61 cells/uL (ref 15–500)
Eosinophils Relative: 1.8 %
HCT: 37.1 % (ref 35.0–45.0)
Hemoglobin: 12 g/dL (ref 11.7–15.5)
Lymphs Abs: 1425 cells/uL (ref 850–3900)
MCH: 27.9 pg (ref 27.0–33.0)
MCHC: 32.3 g/dL (ref 32.0–36.0)
MCV: 86.3 fL (ref 80.0–100.0)
MPV: 12.3 fL (ref 7.5–12.5)
Monocytes Relative: 17.7 %
Neutro Abs: 1292 cells/uL — ABNORMAL LOW (ref 1500–7800)
Neutrophils Relative %: 38 %
Platelets: 161 10*3/uL (ref 140–400)
RBC: 4.3 10*6/uL (ref 3.80–5.10)
RDW: 11.7 % (ref 11.0–15.0)
Total Lymphocyte: 41.9 %
WBC mixed population: 602 cells/uL (ref 200–950)
WBC: 3.4 10*3/uL — ABNORMAL LOW (ref 3.8–10.8)

## 2018-03-16 NOTE — Progress Notes (Signed)
Pharmacy Note Subjective: Patient presents today to the [facility] to see Dr. Estanislado Pandy.   Patient seen by the pharmacist for counseling on methotrexate.  Prior therapy includes methotrexate, Enbrel, and Humira.  Immunization History  Administered Date(s) Administered  . Influenza Split 03/17/2011, 03/17/2012  . Influenza Whole 04/05/2009, 02/21/2010  . Influenza,inj,Quad PF,6+ Mos 03/21/2013, 03/09/2014, 05/24/2015, 01/27/2016  . Pneumococcal Conjugate-13 01/27/2016  . Pneumococcal Polysaccharide-23 03/08/2015  . Td 07/23/2010   Objective:  TB gold negative: 10/23/15  Hepatitis panel: negative11/14/2007  Lab Results  Component Value Date   HIV NONREACTIVE 12/21/2014    Serum Protein Electrophoresis Latest Ref Rng & Units 11/30/2017  Total Protein 6.0 - 8.3 g/dL 7.6  Albumin 55.8 - 66.1 % -  Alpha-1 2.9 - 4.9 % -  Alpha-2 7.1 - 11.8 % -  Beta Globulin 4.7 - 7.2 % -  Beta 2 3.2 - 6.5 % -  Gamma Globulin 11.1 - 18.8 % -   Immunoglobulins: within normal limits 03/13/13  Chest x-ray: 04/06/2016  CBC    Component Value Date/Time   WBC 2.9 (L) 11/30/2017 1645   RBC 4.06 11/30/2017 1645   HGB 11.7 (L) 11/30/2017 1645   HGB 12.4 04/15/2016 0000   HGB 11.2 (L) 07/26/2013 0802   HCT 36.0 11/30/2017 1645   HCT 38.3 04/15/2016 0000   HCT 35.5 07/26/2013 0802   PLT 133.0 (L) 11/30/2017 1645   PLT 189 04/15/2016 0000   MCV 88.7 11/30/2017 1645   MCV 87 04/15/2016 0000   MCV 86 07/26/2013 0802   MCH 28.1 04/15/2016 0000   MCH 27.0 07/26/2013 0802   MCH 27.2 01/24/2012 1430   MCHC 32.5 11/30/2017 1645   RDW 13.3 11/30/2017 1645   RDW 14.0 04/15/2016 0000   RDW 13.6 07/26/2013 0802   LYMPHSABS 1.2 11/30/2017 1645   LYMPHSABS 1.6 04/15/2016 0000   LYMPHSABS 0.9 07/26/2013 0802   MONOABS 0.6 11/30/2017 1645   EOSABS 0.0 11/30/2017 1645   EOSABS 0.1 04/15/2016 0000   EOSABS 0.0 07/26/2013 0802   BASOSABS 0.0 11/30/2017 1645   BASOSABS 0.0 04/15/2016 0000    BASOSABS 0.0 07/26/2013 0802    Assessment/Plan:   Counseled patient of proper use, storage, and dosing of Methotrexate.  Counseled patient on avoiding live vaccines, discontinuing methotrexate for surgical procedures, and any time patient is placed on Antibiotics.  Patient is post-menopausal so no risk of pregnancy. Also counseled on the importance of lab monitor every 3 months.  Patient consented to Methotrexate. Will upload consent in the media tab. Standing orders placed for lab monitor all questions were encouraged and answered.   Mariella Saa, PharmD, [facility] Rheumatology Clinical Pharmacist  03/16/2018 4:41 PM

## 2018-03-16 NOTE — Patient Instructions (Addendum)
Standing Labs We placed an order today for your standing lab work.    Please come back and get your standing labs in 1 month then every 3 months   We have open lab Monday through Friday from 8:30-11:30 AM and 1:30-4:00 PM  at the office of Dr. Bo Merino.   You may experience shorter wait times on Monday and Friday afternoons. The office is located at [address] [place], [place], [place] [address] No appointment is necessary.   Labs are drawn by [facility].  You may receive a bill from [facility] for your lab work. If you have any questions regarding directions or hours of operation,  please call [phone].   Just as a reminder please drink plenty of water prior to coming for your lab work. Thanks!  Vaccines You are taking a medication(s) that can suppress your immune system.  The following immunizations are recommended: . Flu annually . Pneumonia (Pneumovax 6 and Prevnar 13 after age 17) . Shingrix . Hepatitis B  Please check with your PCP to make sure you are up to date.   Methotrexate tablets What is this medicine? METHOTREXATE (METH oh TREX ate) is a chemotherapy drug used to treat cancer including breast cancer, leukemia, and lymphoma. This medicine can also be used to treat psoriasis and certain kinds of arthritis. This medicine may be used for other purposes; ask your health care provider or pharmacist if you have questions. COMMON BRAND NAME(S): Rheumatrex, Trexall What should I tell my health care provider before I take this medicine? They need to know if you have any of these conditions: -fluid in the stomach area or lungs -if you often drink alcohol -infection or immune system problems -kidney disease or on hemodialysis -liver disease -low blood counts, like low white cell, platelet, or red cell counts -lung disease -radiation therapy -stomach ulcers -ulcerative colitis -an unusual or allergic reaction to methotrexate, other medicines, foods, dyes, or  preservatives -pregnant or trying to get pregnant -breast-feeding How should I use this medicine? Take this medicine by mouth with a glass of water. Follow the directions on the prescription label. Take your medicine at regular intervals. Do not take it more often than directed. Do not stop taking except on your doctor's advice. Make sure you know why you are taking this medicine and how often you should take it. If this medicine is used for a condition that is not cancer, like arthritis or psoriasis, it should be taken weekly, NOT daily. Taking this medicine more often than directed can cause serious side effects, even death. Talk to your healthcare provider about safe handling and disposal of this medicine. You may need to take special precautions. Talk to your pediatrician regarding the use of this medicine in children. While this drug may be prescribed for selected conditions, precautions do apply. Overdosage: If you think you have taken too much of this medicine contact a poison control center or emergency room at once. NOTE: This medicine is only for you. Do not share this medicine with others. What if I miss a dose? If you miss a dose, talk with your doctor or health care professional. Do not take double or extra doses. What may interact with this medicine? This medicine may interact with the following medication: -acitretin -aspirin and aspirin-like medicines including salicylates -azathioprine -certain antibiotics like penicillins, tetracycline, and chloramphenicol -cyclosporine -gold -hydroxychloroquine -live virus vaccines -NSAIDs, medicines for pain and inflammation, like ibuprofen or naproxen -other cytotoxic agents -penicillamine -phenylbutazone -phenytoin -probenecid -retinoids  such as isotretinoin and tretinoin -steroid medicines like prednisone or cortisone -sulfonamides like sulfasalazine and trimethoprim/sulfamethoxazole -theophylline This list may not describe all  possible interactions. Give your health care provider a list of all the medicines, herbs, non-prescription drugs, or dietary supplements you use. Also tell them if you smoke, drink alcohol, or use illegal drugs. Some items may interact with your medicine. What should I watch for while using this medicine? Avoid alcoholic drinks. This medicine can make you more sensitive to the sun. Keep out of the sun. If you cannot avoid being in the sun, wear protective clothing and use sunscreen. Do not use sun lamps or tanning beds/booths. You may need blood work done while you are taking this medicine. Call your doctor or health care professional for advice if you get a fever, chills or sore throat, or other symptoms of a cold or flu. Do not treat yourself. This drug decreases your body's ability to fight infections. Try to avoid being around people who are sick. This medicine may increase your risk to bruise or bleed. Call your doctor or health care professional if you notice any unusual bleeding. Check with your doctor or health care professional if you get an attack of severe diarrhea, nausea and vomiting, or if you sweat a lot. The loss of too much body fluid can make it dangerous for you to take this medicine. Talk to your doctor about your risk of cancer. You may be more at risk for certain types of cancers if you take this medicine. Both men and women must use effective birth control with this medicine. Do not become pregnant while taking this medicine or until at least 1 normal menstrual cycle has occurred after stopping it. Women should inform their doctor if they wish to become pregnant or think they might be pregnant. Men should not father a child while taking this medicine and for 3 months after stopping it. There is a potential for serious side effects to an unborn child. Talk to your health care professional or pharmacist for more information. Do not breast-feed an infant while taking this medicine. What  side effects may I notice from receiving this medicine? Side effects that you should report to your doctor or health care professional as soon as possible: -allergic reactions like skin rash, itching or hives, swelling of the face, lips, or tongue -breathing problems or shortness of breath -diarrhea -dry, nonproductive cough -low blood counts - this medicine may decrease the number of white blood cells, red blood cells and platelets. You may be at increased risk for infections and bleeding. -mouth sores -redness, blistering, peeling or loosening of the skin, including inside the mouth -signs of infection - fever or chills, cough, sore throat, pain or trouble passing urine -signs and symptoms of bleeding such as bloody or black, tarry stools; red or dark-brown urine; spitting up blood or brown material that looks like coffee grounds; red spots on the skin; unusual bruising or bleeding from the eye, gums, or nose -signs and symptoms of kidney injury like trouble passing urine or change in the amount of urine -signs and symptoms of liver injury like dark yellow or brown urine; general ill feeling or flu-like symptoms; light-colored stools; loss of appetite; nausea; right upper belly pain; unusually weak or tired; yellowing of the eyes or skin Side effects that usually do not require medical attention (report to your doctor or health care professional if they continue or are bothersome): -dizziness -hair loss -tiredness -upset stomach -vomiting  This list may not describe all possible side effects. Call your doctor for medical advice about side effects. You may report side effects to FDA at 1-800-FDA-1088. Where should I keep my medicine? Keep out of the reach of children. Store at room temperature between 20 and 25 degrees C (68 and 77 degrees F). Protect from light. Throw away any unused medicine after the expiration date. NOTE: This sheet is a summary. It may not cover all possible information.  If you have questions about this medicine, talk to your doctor, pharmacist, or health care provider.  2018 Elsevier/Gold Standard (2015-01-07 05:39:22)

## 2018-03-17 NOTE — Progress Notes (Signed)
Reviewed labs with Dr. Estanislado Pandy. WBC mildly low. Creatinine mildly elevated. GFR is 67. Dr. Estanislado Pandy would like the patient to start on MTX 3 tablet by mouth once weekly. Please advise patient to return for lab work in 1 month.

## 2018-03-18 ENCOUNTER — Telehealth: Payer: Self-pay | Admitting: Rheumatology

## 2018-03-18 MED ORDER — METHOTREXATE 2.5 MG PO TABS: 7.5000 mg | ORAL_TABLET | ORAL | 0 refills | Status: DC

## 2018-03-18 MED ORDER — FOLIC ACID 1 MG PO TABS: 1.0000 mg | ORAL_TABLET | Freq: Every day | ORAL | 3 refills | Status: DC

## 2018-03-18 NOTE — Telephone Encounter (Signed)
Patient advised WBC mildly low. Creatinine mildly elevated. GFR is 67.  patient advised to start on MTX 3 tablet by mouth once weekly. Patient advised to return for lab work in 62 month. Patient verbalized understanding.

## 2018-03-18 NOTE — Telephone Encounter (Signed)
Patient called requesting a return call with her labwork results. 

## 2018-03-18 NOTE — Telephone Encounter (Signed)
-----   Message from Ofilia Neas, PA-C sent at 03/17/2018  4:50 PM EDT ----- Reviewed labs with Dr. Estanislado Pandy. WBC mildly low. Creatinine mildly elevated. GFR is 67. Dr. Estanislado Pandy would like the patient to start on MTX 3 tablet by mouth once weekly. Please advise patient to return for lab work in 1 month.

## 2018-04-19 ENCOUNTER — Other Ambulatory Visit: Payer: Self-pay | Admitting: Physician Assistant

## 2018-04-19 DIAGNOSIS — Z79899 Other long term (current) drug therapy: Secondary | ICD-10-CM

## 2018-04-19 LAB — COMPLETE METABOLIC PANEL WITH GFR
AG Ratio: 1.2 (calc) (ref 1.0–2.5)
ALT: 18 U/L (ref 6–29)
AST: 31 U/L (ref 10–35)
Albumin: 4 g/dL (ref 3.6–5.1)
Alkaline phosphatase (APISO): 68 U/L (ref 33–130)
BUN: 9 mg/dL (ref 7–25)
CO2: 23 mmol/L (ref 20–32)
Calcium: 9.5 mg/dL (ref 8.6–10.4)
Chloride: 106 mmol/L (ref 98–110)
Creat: 0.86 mg/dL (ref 0.50–1.05)
GFR, Est African American: 89 mL/min/{1.73_m2} (ref >=60)
GFR, Est Non African American: 77 mL/min/{1.73_m2} (ref >=60)
Globulin: 3.3 g/dL (calc) (ref 1.9–3.7)
Glucose, Bld: 89 mg/dL (ref 65–99)
Potassium: 4.2 mmol/L (ref 3.5–5.3)
Sodium: 138 mmol/L (ref 135–146)
Total Bilirubin: 0.6 mg/dL (ref 0.2–1.2)
Total Protein: 7.3 g/dL (ref 6.1–8.1)

## 2018-04-19 LAB — CBC WITH DIFFERENTIAL/PLATELET
Basophils Absolute: 31 cells/uL (ref 0–200)
Basophils Relative: 1.1 %
Eosinophils Absolute: 20 cells/uL (ref 15–500)
Eosinophils Relative: 0.7 %
HCT: 37.4 % (ref 35.0–45.0)
Hemoglobin: 12.2 g/dL (ref 11.7–15.5)
Lymphs Abs: 1296 cells/uL (ref 850–3900)
MCH: 28.6 pg (ref 27.0–33.0)
MCHC: 32.6 g/dL (ref 32.0–36.0)
MCV: 87.6 fL (ref 80.0–100.0)
MPV: 12.3 fL (ref 7.5–12.5)
Monocytes Relative: 11.7 %
Neutro Abs: 1126 cells/uL — ABNORMAL LOW (ref 1500–7800)
Neutrophils Relative %: 40.2 %
Platelets: 123 10*3/uL — ABNORMAL LOW (ref 140–400)
RBC: 4.27 10*6/uL (ref 3.80–5.10)
RDW: 12.6 % (ref 11.0–15.0)
Total Lymphocyte: 46.3 %
WBC mixed population: 328 cells/uL (ref 200–950)
WBC: 2.8 10*3/uL — ABNORMAL LOW (ref 3.8–10.8)

## 2018-04-20 ENCOUNTER — Telehealth: Payer: Self-pay | Admitting: *Deleted

## 2018-04-20 DIAGNOSIS — Z79899 Other long term (current) drug therapy: Secondary | ICD-10-CM

## 2018-04-20 NOTE — Progress Notes (Signed)
CMP WNL. WBC count low. Plts are low. Please advise patient to return in 1 month for CBC.

## 2018-04-20 NOTE — Telephone Encounter (Signed)
-----   Message from Ofilia Neas, PA-C sent at 04/20/2018 12:52 PM EST ----- CMP WNL. WBC count low. Plts are low. Please advise patient to return in 1 month for CBC.

## 2018-05-27 LAB — HM DEXA SCAN

## 2018-05-31 ENCOUNTER — Telehealth: Payer: Self-pay | Admitting: *Deleted

## 2018-05-31 NOTE — Telephone Encounter (Signed)
Received Bone Density results from [facility]; forwarded to provider/SLS 01/14

## 2018-05-31 NOTE — Telephone Encounter (Signed)
Bone Density Scan shows Osteopenia. T-Score -1.7   Recommendations per Dr. Estanislado Pandy Calcium and vitamin D

## 2018-05-31 NOTE — Telephone Encounter (Signed)
Patient advised bone density shows Osteopenia. Patient advised to take Calcium and Vitamin D. Patient verbalized understanding.

## 2018-06-01 ENCOUNTER — Other Ambulatory Visit: Payer: Self-pay

## 2018-06-01 DIAGNOSIS — Z79899 Other long term (current) drug therapy: Secondary | ICD-10-CM

## 2018-06-02 ENCOUNTER — Encounter: Payer: Self-pay | Admitting: Internal Medicine

## 2018-06-02 LAB — CBC WITH DIFFERENTIAL/PLATELET
Absolute Monocytes: 385 cells/uL (ref 200–950)
Basophils Absolute: 10 cells/uL (ref 0–200)
Basophils Relative: 0.4 %
Eosinophils Absolute: 10 cells/uL — ABNORMAL LOW (ref 15–500)
Eosinophils Relative: 0.4 %
HCT: 37.3 % (ref 35.0–45.0)
Hemoglobin: 12 g/dL (ref 11.7–15.5)
Lymphs Abs: 1200 cells/uL (ref 850–3900)
MCH: 28.3 pg (ref 27.0–33.0)
MCHC: 32.2 g/dL (ref 32.0–36.0)
MCV: 88 fL (ref 80.0–100.0)
MPV: 12 fL (ref 7.5–12.5)
Monocytes Relative: 15.4 %
Neutro Abs: 895 cells/uL — ABNORMAL LOW (ref 1500–7800)
Neutrophils Relative %: 35.8 %
Platelets: 151 10*3/uL (ref 140–400)
RBC: 4.24 10*6/uL (ref 3.80–5.10)
RDW: 12.5 % (ref 11.0–15.0)
Total Lymphocyte: 48 %
WBC: 2.5 10*3/uL — ABNORMAL LOW (ref 3.8–10.8)

## 2018-06-02 LAB — COMPLETE METABOLIC PANEL WITH GFR
AG Ratio: 1.2 (calc) (ref 1.0–2.5)
ALT: 17 U/L (ref 6–29)
AST: 27 U/L (ref 10–35)
Albumin: 4.3 g/dL (ref 3.6–5.1)
Alkaline phosphatase (APISO): 65 U/L (ref 33–130)
BUN: 11 mg/dL (ref 7–25)
CO2: 29 mmol/L (ref 20–32)
Calcium: 10 mg/dL (ref 8.6–10.4)
Chloride: 105 mmol/L (ref 98–110)
Creat: 0.85 mg/dL (ref 0.50–1.05)
GFR, Est African American: 90 mL/min/{1.73_m2} (ref >=60)
GFR, Est Non African American: 78 mL/min/{1.73_m2} (ref >=60)
Globulin: 3.5 g/dL (calc) (ref 1.9–3.7)
Glucose, Bld: 86 mg/dL (ref 65–99)
Potassium: 4.7 mmol/L (ref 3.5–5.3)
Sodium: 141 mmol/L (ref 135–146)
Total Bilirubin: 0.6 mg/dL (ref 0.2–1.2)
Total Protein: 7.8 g/dL (ref 6.1–8.1)

## 2018-06-02 NOTE — Progress Notes (Signed)
CMP WNL. WBC count continues to trend down.  Dr. Estanislado Pandy would like the patient to continue to have CBC drawn once a month.

## 2018-06-03 ENCOUNTER — Telehealth: Payer: Self-pay | Admitting: *Deleted

## 2018-06-03 DIAGNOSIS — Z79899 Other long term (current) drug therapy: Secondary | ICD-10-CM

## 2018-06-03 NOTE — Telephone Encounter (Signed)
-----   Message from Ofilia Neas, PA-C sent at 06/02/2018  4:32 PM EST ----- CMP WNL. WBC count continues to trend down.  Dr. Estanislado Pandy would like the patient to continue to have CBC drawn once a month.

## 2018-06-03 NOTE — Telephone Encounter (Signed)
Received Dermatopathology Report results from [facility]; forwarded to provider/SLS 01/17

## 2018-06-06 ENCOUNTER — Encounter: Payer: Self-pay | Admitting: Internal Medicine

## 2018-06-08 ENCOUNTER — Other Ambulatory Visit: Payer: Self-pay

## 2018-06-08 DIAGNOSIS — E039 Hypothyroidism, unspecified: Secondary | ICD-10-CM

## 2018-06-10 NOTE — Progress Notes (Signed)
Office Visit Note  Patient: Brittany Hodges             Date of Birth: January 24, 1966           MRN: [id number]             PCP: Colon Branch, MD Referring: Colon Branch, MD Visit Date: 06/23/2018 Occupation: @GUAROCC @  Subjective:  Left eye redness   History of Present Illness: Brittany Hodges is a 52 y.o. female with history of seropositive rheumatoid arthritis, iritis, osteoarthritis, and DDD.  She is taking MTX 3 tablets by mouth once weekly and folic acid 1 mg po daily.   She continues to follow up with Dr. Sabra Heck on a regular basis.  She had an appointment about 2 weeks ago. She has not noticed any improvement while on MTX.  She previously was on Humira which was effective.  She would like to proceed with Humira and stop MTX.  She continues to have recurrent flares of iritis.  She has left eye redness today.  She does not have any eye pain, dryness, or photophobia at this time. She uses prednisone drops PRN.  She denies any joint pain or joint swelling at this time.  She denies any RA flares.  She has no morning stiffness. She works out at [employer] 3 times weekly. She has intermittent neck pain but no stiffness or radiculopathy.  Her right trochanteric bursitis has resolved.    Activities of Daily Living:  Patient reports morning stiffness for 0 minutes.   Patient Denies nocturnal pain.  Difficulty dressing/grooming: Denies Difficulty climbing stairs: Denies Difficulty getting out of chair: Denies Difficulty using hands for taps, buttons, cutlery, and/or writing: Denies  Review of Systems  Constitutional: Negative for fatigue.  HENT: Negative for mouth sores, mouth dryness and nose dryness.   Eyes: Positive for redness. Negative for pain, visual disturbance and dryness.  Respiratory: Negative for cough, hemoptysis, shortness of breath and difficulty breathing.   Cardiovascular: Negative for chest pain, palpitations, hypertension and swelling in legs/feet.  Gastrointestinal:  Negative for blood in stool, constipation and diarrhea.  Endocrine: Negative for increased urination.  Genitourinary: Negative for painful urination.  Musculoskeletal: Negative for arthralgias, joint pain, joint swelling, myalgias, muscle weakness, morning stiffness, muscle tenderness and myalgias.  Skin: Negative for color change, pallor, rash, hair loss, nodules/bumps, skin tightness, ulcers and sensitivity to sunlight.  Allergic/Immunologic: Negative for susceptible to infections.  Neurological: Negative for dizziness, numbness, headaches and weakness.  Hematological: Negative for swollen glands.  Psychiatric/Behavioral: Positive for sleep disturbance. Negative for depressed mood. The patient is not nervous/anxious.     PMFS History:  Patient Active Problem List   Diagnosis Date Noted  . Asthma 04/06/2016  . Laryngopharyngeal reflux (LPR) 04/06/2016  . PCP NOTES >>>>> 03/09/2015  . Annual physical exam 12/21/2014  . Cardiomegaly, by MRI 03/16/2014  . Leukopenia 03/10/2014  . Lightheadedness 02/10/2011  . SINOATRIAL NODE DYSFUNCTION 08/06/2008  . ATRIAL SEPTAL DEFECT 06/29/2007  . Hypothyroidism 03/21/2007  . Essential hypertension 03/21/2007  . Allergic rhinitis 03/21/2007  . GERD-- dysphagia 03/21/2007  . CONSTIPATION 03/21/2007  . IRRITABLE BOWEL SYNDROME 03/21/2007  . RA (rheumatoid arthritis) ([place]) 03/21/2007  . LOW BACK PAIN 03/21/2007  . FIBROMYALGIA 03/21/2007    Past Medical History:  Diagnosis Date  . Allergic rhinitis, cause unspecified   . Anemia   . Asthma   . Atrial septal defect    s/p repair;   Echo 08/07/10: EF 55-60%; mild MR;  atrial septal aneurysm; no residual ASD  . Chest pain, unspecified    cardiac cath 07/31/10: normal coronaries; vigorous LVF  . Esophageal reflux   . Fibromyalgia   . Headache(784.0)   . Hematuria, unspecified   . Hypertension    no meds   . Hypothyroidism   . Irritable bowel syndrome   . Low back pain   . Rheumatoid  arthritis(714.0)    Dr Herold Harms  . Scoliosis   . Sinoatrial node dysfunction (HCC)    eval. for chronotropic competence completed in past    Family History  Problem Relation Age of Onset  . Hypertension Mother   . Diabetes Mother   . Heart disease Maternal Grandmother   . Stomach cancer Maternal Grandmother 95  . Glaucoma Father   . Breast cancer Other        aunt  . Diabetes Other        GM  . Hypertension Sister   . Thyroid disease Sister   . Hypertension Sister   . Thyroid disease Sister   . Colon cancer Neg Hx    Past Surgical History:  Procedure Laterality Date  . ASD REPAIR  1985  . TONSILLECTOMY AND ADENOIDECTOMY    . TUBAL LIGATION     Social History   Social History Narrative   Household-- pt and youngest son    Immunization History  Administered Date(s) Administered  . Influenza Split 03/17/2011, 03/17/2012  . Influenza Whole 04/05/2009, 02/21/2010  . Influenza,inj,Quad PF,6+ Mos 03/21/2013, 03/09/2014, 05/24/2015, 01/27/2016  . Pneumococcal Conjugate-13 01/27/2016  . Pneumococcal Polysaccharide-23 03/08/2015  . Td 07/23/2010     Objective: Vital Signs: BP 113/78 (BP Location: Left Arm, Patient Position: Sitting, Cuff Size: Normal)   Pulse (!) 59   Resp 13   Ht 5\' 9"  (1.753 m)   Wt 134 lb (60.8 kg)   BMI 19.79 kg/m    Physical Exam Vitals signs and nursing note reviewed.  Constitutional:      Appearance: She is well-developed.  HENT:     Head: Normocephalic and atraumatic.  Eyes:     Conjunctiva/sclera: Conjunctivae normal.     Comments: Bilateral conjunctival injection  Neck:     Musculoskeletal: Normal range of motion.  Cardiovascular:     Rate and Rhythm: Normal rate and regular rhythm.     Heart sounds: Normal heart sounds.  Pulmonary:     Effort: Pulmonary effort is normal.     Breath sounds: Normal breath sounds.  Abdominal:     General: Bowel sounds are normal.     Palpations: Abdomen is soft.  Lymphadenopathy:     Cervical:  No cervical adenopathy.  Skin:    General: Skin is warm and dry.     Capillary Refill: Capillary refill takes less than 2 seconds.  Neurological:     Mental Status: She is alert and oriented to person, place, and time.  Psychiatric:        Behavior: Behavior normal.      Musculoskeletal Exam: C-spine, thoracic spine, and lumbar spine good ROM.  No midline spinal tenderness.  No SI joint tenderness.  Shoulder joints, elbow joints, wrist joints, MCPs, PIPs, and DIPs good ROM with no synovitis.  Complete fist formation bilaterally.  Hip joints, knee joints, ankle joints, MTPs, PIPs ,and DIPs good ROM with no synovitis.  No warmth or effusion of knee joints.  No tenderness or swelling of ankle joints.  No tenderness over trochanteric bursa bilaterally.   CDAI Exam: CDAI  Score: Not documented Patient Global Assessment: Not documented; Provider Global Assessment: 0 (mm) Swollen: 0 ; Tender: 0  Joint Exam   Not documented   There is currently no information documented on the homunculus. Go to the Rheumatology activity and complete the homunculus joint exam.  Investigation: No additional findings.  Imaging: No results found.  Recent Labs: Lab Results  Component Value Date   WBC 2.5 (L) 06/01/2018   HGB 12.0 06/01/2018   PLT 151 06/01/2018   NA 141 06/01/2018   K 4.7 06/01/2018   CL 105 06/01/2018   CO2 29 06/01/2018   GLUCOSE 86 06/01/2018   BUN 11 06/01/2018   CREATININE 0.85 06/01/2018   BILITOT 0.6 06/01/2018   ALKPHOS 60 11/30/2017   AST 27 06/01/2018   ALT 17 06/01/2018   PROT 7.8 06/01/2018   ALBUMIN 4.1 11/30/2017   CALCIUM 10.0 06/01/2018   GFRAA 90 06/01/2018    Speciality Comments: Dexa- 05/27/18 Osteopenia T-Score -1.7 BMD 0.963   Procedures:  No procedures performed Allergies: Etanercept; Methotrexate derivatives; and Pregabalin   Assessment / Plan:     Visit Diagnoses: Rheumatoid arthritis involving multiple sites with positive rheumatoid factor +CCP  (HCC)-patient had no synovitis on examination.  She continues to have recurrent iritis.  She has been using the steroid eyedrops and taking low-dose methotrexate.  I had detailed discussion regarding use of Humira with patient today.  Patient states that she was on Humira in the past and it controlled her rheumatoid arthritis very well but she discontinued it due to frequent infections.  We discussed that methotrexate dose cannot be increased due to neutropenia.  It would be worth trying MRI and see if she can tolerate that better.  It may control her ocular symptoms better.  She was in agreement.  Indication side effects contraindications were discussed at length.  Handout was given and consent was taken.  We will apply for Humira.  Once approved we can start on Humira.  I would like her to discontinue methotrexate prior to starting Humira due to neutropenia.  High risk medication use - MTX 3 tablets po once weekly and folic acid 1 mg po daily.  She is unable to take increased dose of methotrexate due to neutropenia.  Iritis - Dr. Teodoro Spray, Recurrent iritis   Primary osteoarthritis of both hands-she is currently not having much discomfort.  Primary osteoarthritis of both feet-doing well.  DDD (degenerative disc disease), cervical-she has fairly good range of motion of her cervical spine with minimal discomfort.  Trochanteric bursitis, right hip-resolved.  History of scoliosis  Other neutropenia ([place]): We will recheck CBC in 2 weeks and then monthly.    History of hypothyroidism  History of hypertension-blood pressure is controlled.  History of migraine  History of gastroesophageal reflux (GERD)  History of bradycardia/ sinoatrial node dysfunction   History of IBS   Orders: Orders Placed This Encounter  Procedures  . Hepatitis panel, acute  . QuantiFERON-TB Gold Plus  . IgG, IgA, IgM   No orders of the defined types were placed in this encounter.    Follow-Up  Instructions: Return in about 3 months (around 09/21/2018) for Rheumatoid arthritis, iritis.   Bo Merino, MD  Note - This record has been created using Editor, commissioning.  Chart creation errors have been sought, but may not always  have been located. Such creation errors do not reflect on  the standard of medical care.

## 2018-06-23 ENCOUNTER — Other Ambulatory Visit (INDEPENDENT_AMBULATORY_CARE_PROVIDER_SITE_OTHER): Payer: BLUE CROSS/BLUE SHIELD

## 2018-06-23 ENCOUNTER — Encounter: Payer: Self-pay | Admitting: Physician Assistant

## 2018-06-23 ENCOUNTER — Ambulatory Visit: Payer: BLUE CROSS/BLUE SHIELD | Admitting: Rheumatology

## 2018-06-23 VITALS — BP 113/78 | HR 59 | Resp 13 | Ht 69.0 in | Wt 134.0 lb

## 2018-06-23 DIAGNOSIS — M0579 Rheumatoid arthritis with rheumatoid factor of multiple sites without organ or systems involvement: Secondary | ICD-10-CM | POA: Diagnosis not present

## 2018-06-23 DIAGNOSIS — Z79899 Other long term (current) drug therapy: Secondary | ICD-10-CM

## 2018-06-23 DIAGNOSIS — E039 Hypothyroidism, unspecified: Secondary | ICD-10-CM | POA: Diagnosis not present

## 2018-06-23 DIAGNOSIS — Z8739 Personal history of other diseases of the musculoskeletal system and connective tissue: Secondary | ICD-10-CM

## 2018-06-23 DIAGNOSIS — D708 Other neutropenia: Secondary | ICD-10-CM

## 2018-06-23 DIAGNOSIS — M19041 Primary osteoarthritis, right hand: Secondary | ICD-10-CM | POA: Diagnosis not present

## 2018-06-23 DIAGNOSIS — Z8679 Personal history of other diseases of the circulatory system: Secondary | ICD-10-CM

## 2018-06-23 DIAGNOSIS — Z8719 Personal history of other diseases of the digestive system: Secondary | ICD-10-CM

## 2018-06-23 DIAGNOSIS — M19042 Primary osteoarthritis, left hand: Secondary | ICD-10-CM

## 2018-06-23 DIAGNOSIS — M19071 Primary osteoarthritis, right ankle and foot: Secondary | ICD-10-CM

## 2018-06-23 DIAGNOSIS — M19072 Primary osteoarthritis, left ankle and foot: Secondary | ICD-10-CM

## 2018-06-23 DIAGNOSIS — Z87898 Personal history of other specified conditions: Secondary | ICD-10-CM

## 2018-06-23 DIAGNOSIS — H209 Unspecified iridocyclitis: Secondary | ICD-10-CM

## 2018-06-23 DIAGNOSIS — Z8669 Personal history of other diseases of the nervous system and sense organs: Secondary | ICD-10-CM

## 2018-06-23 DIAGNOSIS — M5030 Other cervical disc degeneration, unspecified cervical region: Secondary | ICD-10-CM

## 2018-06-23 DIAGNOSIS — Z8639 Personal history of other endocrine, nutritional and metabolic disease: Secondary | ICD-10-CM

## 2018-06-23 LAB — T3, FREE: T3, Free: 3.8 pg/mL (ref 2.3–4.2)

## 2018-06-23 LAB — T4, FREE: Free T4: 0.85 ng/dL (ref 0.60–1.60)

## 2018-06-23 LAB — TSH: TSH: 4.65 u[IU]/mL — ABNORMAL HIGH (ref 0.35–4.50)

## 2018-06-23 NOTE — Progress Notes (Signed)
Pharmacy Note Subjective: Patient presents today to the [facility] to see Dr. Estanislado Pandy.   Patient seen by the pharmacist for counseling on Humira for rheumatoid arthritis. She has had a trial of Humira in the past with good response but it was stopped due to recurrent infections.  Patient is willing to try another trial of Humira as she now has eye involvement.  Prior therapy includes: Plaquenil (inadequate response) , Enbrel (injection site reaction) and methotrexate (neutropenia so on low dose).  Objective:  CBC    Component Value Date/Time   WBC 2.5 (L) 06/01/2018 1423   RBC 4.24 06/01/2018 1423   HGB 12.0 06/01/2018 1423   HGB 12.4 04/15/2016 0000   HGB 11.2 (L) 07/26/2013 0802   HCT 37.3 06/01/2018 1423   HCT 38.3 04/15/2016 0000   HCT 35.5 07/26/2013 0802   PLT 151 06/01/2018 1423   PLT 189 04/15/2016 0000   MCV 88.0 06/01/2018 1423   MCV 87 04/15/2016 0000   MCV 86 07/26/2013 0802   MCH 28.3 06/01/2018 1423   MCHC 32.2 06/01/2018 1423   RDW 12.5 06/01/2018 1423   RDW 14.0 04/15/2016 0000   RDW 13.6 07/26/2013 0802   LYMPHSABS 1,200 06/01/2018 1423   LYMPHSABS 1.6 04/15/2016 0000   LYMPHSABS 0.9 07/26/2013 0802   MONOABS 0.6 11/30/2017 1645   EOSABS 10 (L) 06/01/2018 1423   EOSABS 0.1 04/15/2016 0000   EOSABS 0.0 07/26/2013 0802   BASOSABS 10 06/01/2018 1423   BASOSABS 0.0 04/15/2016 0000   BASOSABS 0.0 07/26/2013 0802     CMP     Component Value Date/Time   NA 141 06/01/2018 1423   NA 140 04/15/2016 0000   K 4.7 06/01/2018 1423   CL 105 06/01/2018 1423   CO2 29 06/01/2018 1423   GLUCOSE 86 06/01/2018 1423   BUN 11 06/01/2018 1423   BUN 13 04/15/2016 0000   CREATININE 0.85 06/01/2018 1423   CALCIUM 10.0 06/01/2018 1423   PROT 7.8 06/01/2018 1423   PROT 8.4 04/15/2016 0000   ALBUMIN 4.1 11/30/2017 1645   ALBUMIN 4.3 04/15/2016 0000   AST 27 06/01/2018 1423   ALT 17 06/01/2018 1423   ALKPHOS 60 11/30/2017 1645   BILITOT 0.6 06/01/2018  1423   BILITOT 0.2 04/15/2016 0000   GFRNONAA 78 06/01/2018 1423   GFRAA 90 06/01/2018 1423     Baseline Immunosuppressant Therapy Labs  TB Gold: pending 06/23/2018  Hepatitis:pending 06/23/2018  Lab Results  Component Value Date   HIV NONREACTIVE 12/21/2014   Immunoglobulins: pending 06/23/2018  Serum Protein Electrophoresis Latest Ref Rng & Units 06/01/2018  Total Protein 6.1 - 8.1 g/dL 7.8  Albumin 55.8 - 66.1 % -  Alpha-1 2.9 - 4.9 % -  Alpha-2 7.1 - 11.8 % -  Beta Globulin 4.7 - 7.2 % -  Beta 2 3.2 - 6.5 % -  Gamma Globulin 11.1 - 18.8 % -    No results found for: G6PDH  No results found for: TPMT   Chest x-ray: no active disease 04/06/2016  Does patient have diagnosis of heart failure?  No  Assessment/Plan:  Counseled patient that Humira is a TNF blocking agent.  Counseled patient on purpose, proper use, and adverse effects of Humira.  Reviewed the most common adverse effects including infections, headache, and injection site reactions. Discussed that there is the possibility of an increased risk of malignancy but it is not well understood if this increased risk is due to the medication or the  disease state.  Advised patient to get yearly dermatology exams due to risk of skin cancer. Counseled patient that Humira should be held prior to scheduled surgery.  Counseled patient to avoid live vaccines while on Humira.  Advised patient to get annual influenza vaccine and the pneumococcal vaccine as indicated.    Reviewed the importance of regular labs while on Humira therapy.  Standing orders placed.  Provided patient with medication education material and answered all questions.  Patient consented to Humira.  Will upload consent into the media tab.  Reviewed storage instructions of Humira.  Advised initial injection must be administered in office.  Patient verbalized understanding.  Dose will be for rheumatoid arthritis Humira 40 mg every 14 days.  Prescription pending lab results  and/or insurance approval.  All questions encouraged and answered.  Instructed patient to call with any further questions or concerns.  Mariella Saa, PharmD, BCACP Rheumatology Clinical Pharmacist  06/23/2018 4:00 PM

## 2018-06-23 NOTE — Patient Instructions (Addendum)
Standing Labs We placed an order today for your standing lab work.    Please come back and get your standing labs in 2 weeks then every month   We have open lab Monday through Friday from 8:30-11:30 AM and 1:30-4:00 PM  at the office of Dr. Bo Merino.   You may experience shorter wait times on Monday and Friday afternoons. The office is located at [address] [place], [place], [place] [address] No appointment is necessary.   Labs are drawn by [facility].  You may receive a bill from [facility] for your lab work.  If you wish to have your labs drawn at another location, please call the office 24 hours in advance to send orders.  If you have any questions regarding directions or hours of operation,  please call [phone].   Just as a reminder please drink plenty of water prior to coming for your lab work. Thanks!  In addition to staying up to date on lab work and vaccines it is important to:  Marland Kitchen Yearly skin checks with dermatologist due to increase risk in skin cancer with TNF inhibitors .   Helpful Tips for Injecting To help alleviate pain and injection site reactions consider the following tips:  . Placing something cold (like and ice gel pack or cold water bottle) on the injection site just before cleansing with alcohol may help reduce pain . If you have a localized reaction (redness, mild swelling, warmth, and itching) you can use topical corticosteroids (hydrocortisone cream) or antihistamine (Claritin) to help minimize reaction the day of, the day before, and the day after injecting . Always inject this medication at room temperature (remove from refrigerator 15-20 minutes before injecting; this may help eliminate stinging). . Always rotate your injection sites using inside/outside thigh of both legs, abdomen divided into 4 quadrants (stay away from waist line, and 2" away from navel). . Do not inject into areas where skin is tender, bruised, red, or hard or where there are  scars or stretch marks. . Always let alcohol dry on the skin before injecting. . Place a cold, damp towel or small ice pack on the injection site for 10 or 15 minutes every 1 to 2 hours if it hurts or is swollen.

## 2018-06-25 LAB — HEPATITIS PANEL, ACUTE
Hep A IgM: NONREACTIVE
Hep B C IgM: NONREACTIVE
Hepatitis B Surface Ag: NONREACTIVE
Hepatitis C Ab: NONREACTIVE
SIGNAL TO CUT-OFF: 0.06 (ref <1.00)

## 2018-06-25 LAB — QUANTIFERON-TB GOLD PLUS
Mitogen-NIL: >10 IU/mL
NIL: 0.04 IU/mL
QuantiFERON-TB Gold Plus: NEGATIVE
TB1-NIL: 0 IU/mL
TB2-NIL: <0 IU/mL

## 2018-06-25 LAB — IGG, IGA, IGM
IgG (Immunoglobin G), Serum: 2196 mg/dL — ABNORMAL HIGH (ref 600–1640)
IgM, Serum: 118 mg/dL (ref 50–300)
Immunoglobulin A: 400 mg/dL — ABNORMAL HIGH (ref 47–310)

## 2018-06-27 ENCOUNTER — Other Ambulatory Visit: Payer: Self-pay | Admitting: Rheumatology

## 2018-06-27 ENCOUNTER — Telehealth: Payer: Self-pay | Admitting: Pharmacist

## 2018-06-27 NOTE — Telephone Encounter (Signed)
Called patient to notify labs were normal and she is able to schedule Humira new start visit.  She was scheduled for 2/21 at 8:15am.

## 2018-06-27 NOTE — Progress Notes (Signed)
Labs are normal.  Okay to start on Humira.

## 2018-06-27 NOTE — Telephone Encounter (Signed)
Last Visit: 06/23/18 Next visit: 12/05/18 Labs: 06/01/18 CMP WNL. WBC count continues to trend down.  Okay to refill per Dr. Estanislado Pandy

## 2018-07-08 ENCOUNTER — Ambulatory Visit: Payer: Self-pay

## 2018-07-08 NOTE — Progress Notes (Deleted)
Pharmacy Note  Subjective:   Patient is being initiated on ***.  Patient was previously counseled extensively on and consented to initiation of *** at that time.  Patient presents to clinic today to receive the first dose of ***.     Objective: CMP     Component Value Date/Time   NA 141 06/01/2018 1423   NA 140 04/15/2016 0000   K 4.7 06/01/2018 1423   CL 105 06/01/2018 1423   CO2 29 06/01/2018 1423   GLUCOSE 86 06/01/2018 1423   BUN 11 06/01/2018 1423   BUN 13 04/15/2016 0000   CREATININE 0.85 06/01/2018 1423   CALCIUM 10.0 06/01/2018 1423   PROT 7.8 06/01/2018 1423   PROT 8.4 04/15/2016 0000   ALBUMIN 4.1 11/30/2017 1645   ALBUMIN 4.3 04/15/2016 0000   AST 27 06/01/2018 1423   ALT 17 06/01/2018 1423   ALKPHOS 60 11/30/2017 1645   BILITOT 0.6 06/01/2018 1423   BILITOT 0.2 04/15/2016 0000   GFRNONAA 78 06/01/2018 1423   GFRAA 90 06/01/2018 1423    CBC    Component Value Date/Time   WBC 2.5 (L) 06/01/2018 1423   RBC 4.24 06/01/2018 1423   HGB 12.0 06/01/2018 1423   HGB 12.4 04/15/2016 0000   HGB 11.2 (L) 07/26/2013 0802   HCT 37.3 06/01/2018 1423   HCT 38.3 04/15/2016 0000   HCT 35.5 07/26/2013 0802   PLT 151 06/01/2018 1423   PLT 189 04/15/2016 0000   MCV 88.0 06/01/2018 1423   MCV 87 04/15/2016 0000   MCV 86 07/26/2013 0802   MCH 28.3 06/01/2018 1423   MCHC 32.2 06/01/2018 1423   RDW 12.5 06/01/2018 1423   RDW 14.0 04/15/2016 0000   RDW 13.6 07/26/2013 0802   LYMPHSABS 1,200 06/01/2018 1423   LYMPHSABS 1.6 04/15/2016 0000   LYMPHSABS 0.9 07/26/2013 0802   MONOABS 0.6 11/30/2017 1645   EOSABS 10 (L) 06/01/2018 1423   EOSABS 0.1 04/15/2016 0000   EOSABS 0.0 07/26/2013 0802   BASOSABS 10 06/01/2018 1423   BASOSABS 0.0 04/15/2016 0000   BASOSABS 0.0 07/26/2013 0802    Baseline Immunosuppressant Therapy Labs Quantiferon TB Gold Latest Ref Rng & Units 06/23/2018  Quantiferon TB Gold Plus NEGATIVE NEGATIVE    Hepatitis Latest Ref Rng & Units 06/23/2018   Hep B Surface Ag NON-REACTI NON-REACTIVE  Hep B IgM NON-REACTI NON-REACTIVE  Hep C Ab NON-REACTI NON-REACTIVE  Hep C Ab NON-REACTI NON-REACTIVE  Hep A IgM NON-REACTI NON-REACTIVE    Lab Results  Component Value Date   HIV NONREACTIVE 12/21/2014    Immunoglobulin Electrophoresis Latest Ref Rng & Units 06/23/2018  IgA  47 - 310 mg/dL 400(H)  IgG 600 - 1,640 mg/dL 2,196(H)  IgM 50 - 300 mg/dL 118    Serum Protein Electrophoresis Latest Ref Rng & Units 06/01/2018  Total Protein 6.1 - 8.1 g/dL 7.8  Albumin 55.8 - 66.1 % -  Alpha-1 2.9 - 4.9 % -  Alpha-2 7.1 - 11.8 % -  Beta Globulin 4.7 - 7.2 % -  Beta 2 3.2 - 6.5 % -  Gamma Globulin 11.1 - 18.8 % -    No results found for: G6PDH  No results found for: TPMT   Assessment/Plan:  Demonstrated proper injection technique with **** demo pen.  Patient able to demonstrate proper injection technique using the teach back method.  Patient self injected in the **** with:  Sample Medication: *** NDC: *** Lot: *** Expiration: **  Patient tolerated well.  Observed for 30 mins in office for adverse reaction and ***. Instructed patient to call with any questions/issues.     Patient will need standing lab orders in one month.  Provided patient with standing lab instructions and placed standing lab order.

## 2018-07-12 ENCOUNTER — Ambulatory Visit: Payer: Self-pay

## 2018-07-12 ENCOUNTER — Telehealth: Payer: Self-pay | Admitting: Pharmacist

## 2018-07-12 NOTE — Telephone Encounter (Signed)
Received a Prior Authorization request from [employer] for HUMIRA. Authorization has been submitted to patient's insurance via Cover My Meds. Will update once we receive a response.  

## 2018-07-12 NOTE — Telephone Encounter (Signed)
Patient was a no-show for her new start appointment today scheduled for 3 PM.  Patient called at [phone] stating that she we thought her appointment was for tomorrow.  Her mother just had surgery and is now leaving the hospital.  Informed patient that we would not be able to complete her new start visit today.  Patient verbalized understanding.  She states she feels like she is getting a sinus infection.  She is in the process of trying to schedule an appointment with her ENT and the provider.  Informed patient that we will need to hold her new start visit until her infection has resolved and/or she finishes a course of antibiotics.  Patient verbalized understanding.  Instructed patient to follow-up with [place] after her appointment.  All questions encouraged and answered.  Instructed patient to call with any further questions or concerns.  Mariella Saa, PharmD, [facility] Rheumatology Clinical Pharmacist  07/12/2018 3:47 PM

## 2018-07-12 NOTE — Telephone Encounter (Signed)
Received a fax from [employer] regarding a prior authorization for [facility]. Authorization has been APPROVED from 06/12/2018 to 10/10/2018.   Will send document to scan center.  Authorization # [id number]  Per plan, patient must fill through [facility].

## 2018-07-20 NOTE — Telephone Encounter (Signed)
Called patient to follow-up at that Humira new start appointment.  Patient is still having congestion and yellow drainage from her nose.  She has not been able to schedule an appointment with her ENT provider.  She has had a difficult time scheduling appointments as she is having to take her mother to dialysis. Expressed understanding of her stressful time and schedule.  Patient states she will try to schedule an appointment with her ENT by the end of the week.  Instructed patient to call our office to update [place] after that appointment if she has an infection or is on antibiotics in order to schedule new start visit.  Patient verbalized understanding.  All questions encouraged and answered.  Instructed patient to call with any further questions or concerns.  Mariella Saa, PharmD, [facility] Rheumatology Clinical Pharmacist  07/20/2018 11:05 AM

## 2018-10-03 LAB — HM MAMMOGRAPHY

## 2018-10-05 ENCOUNTER — Ambulatory Visit (INDEPENDENT_AMBULATORY_CARE_PROVIDER_SITE_OTHER): Payer: BLUE CROSS/BLUE SHIELD

## 2018-10-05 ENCOUNTER — Ambulatory Visit: Payer: BLUE CROSS/BLUE SHIELD | Admitting: Rheumatology

## 2018-10-05 ENCOUNTER — Other Ambulatory Visit: Payer: Self-pay

## 2018-10-05 ENCOUNTER — Encounter: Payer: Self-pay | Admitting: Rheumatology

## 2018-10-05 VITALS — BP 131/81 | HR 48 | Resp 12 | Ht 69.0 in | Wt 134.6 lb

## 2018-10-05 DIAGNOSIS — M546 Pain in thoracic spine: Secondary | ICD-10-CM

## 2018-10-05 DIAGNOSIS — M19072 Primary osteoarthritis, left ankle and foot: Secondary | ICD-10-CM

## 2018-10-05 DIAGNOSIS — H209 Unspecified iridocyclitis: Secondary | ICD-10-CM

## 2018-10-05 DIAGNOSIS — Z8669 Personal history of other diseases of the nervous system and sense organs: Secondary | ICD-10-CM

## 2018-10-05 DIAGNOSIS — M19071 Primary osteoarthritis, right ankle and foot: Secondary | ICD-10-CM

## 2018-10-05 DIAGNOSIS — Z8719 Personal history of other diseases of the digestive system: Secondary | ICD-10-CM

## 2018-10-05 DIAGNOSIS — Z8739 Personal history of other diseases of the musculoskeletal system and connective tissue: Secondary | ICD-10-CM

## 2018-10-05 DIAGNOSIS — Z8679 Personal history of other diseases of the circulatory system: Secondary | ICD-10-CM

## 2018-10-05 DIAGNOSIS — Z79899 Other long term (current) drug therapy: Secondary | ICD-10-CM

## 2018-10-05 DIAGNOSIS — M0579 Rheumatoid arthritis with rheumatoid factor of multiple sites without organ or systems involvement: Secondary | ICD-10-CM

## 2018-10-05 DIAGNOSIS — M19042 Primary osteoarthritis, left hand: Secondary | ICD-10-CM

## 2018-10-05 DIAGNOSIS — D708 Other neutropenia: Secondary | ICD-10-CM

## 2018-10-05 DIAGNOSIS — M5030 Other cervical disc degeneration, unspecified cervical region: Secondary | ICD-10-CM

## 2018-10-05 DIAGNOSIS — Z8639 Personal history of other endocrine, nutritional and metabolic disease: Secondary | ICD-10-CM

## 2018-10-05 DIAGNOSIS — M19041 Primary osteoarthritis, right hand: Secondary | ICD-10-CM

## 2018-10-05 DIAGNOSIS — Z87898 Personal history of other specified conditions: Secondary | ICD-10-CM

## 2018-10-05 NOTE — Progress Notes (Signed)
Office Visit Note  Patient: Brittany Hodges             Date of Birth: [birth year]           MRN: [id number]             PCP: Colon Branch, MD Referring: Colon Branch, MD Visit Date: 10/05/2018 Occupation: @GUAROCC @  Subjective:  Recurrent iritis.    History of Present Illness: Brittany Hodges is a 53 y.o. female with history of rheumatoid arthritis and iritis.  She states she came off methotrexate in January after her white cell count became low..  She did not start on Humira because of the sinus infection about a month ago.  She has not had a follow-up on her sinus infection.  She reports having tenderness in the maxillary sinus area.  She denies any joint swelling currently.  She states that she has been having thoracic pain.  Activities of Daily Living:  Patient reports morning stiffness for 0 minute.   Patient Denies nocturnal pain.  Difficulty dressing/grooming: Denies Difficulty climbing stairs: Denies Difficulty getting out of chair: Denies Difficulty using hands for taps, buttons, cutlery, and/or writing: Denies  Review of Systems  Constitutional: Negative for fatigue, night sweats, weight gain and weight loss.  HENT: Negative for mouth sores, trouble swallowing, trouble swallowing, mouth dryness and nose dryness.   Eyes: Positive for redness. Negative for pain, visual disturbance and dryness.  Respiratory: Negative for cough, shortness of breath and difficulty breathing.   Cardiovascular: Negative for chest pain, palpitations, hypertension, irregular heartbeat and swelling in legs/feet.  Gastrointestinal: Negative for blood in stool, constipation and diarrhea.  Endocrine: Negative for increased urination.  Genitourinary: Negative for vaginal dryness.  Musculoskeletal: Positive for arthralgias and joint pain. Negative for joint swelling, myalgias, muscle weakness, morning stiffness, muscle tenderness and myalgias.  Skin: Negative for color change, rash, hair loss,  skin tightness, ulcers and sensitivity to sunlight.  Allergic/Immunologic: Negative for susceptible to infections.  Neurological: Negative for dizziness, memory loss, night sweats and weakness.  Hematological: Negative for swollen glands.  Psychiatric/Behavioral: Negative for depressed mood and sleep disturbance. The patient is not nervous/anxious.     PMFS History:  Patient Active Problem List   Diagnosis Date Noted  . Asthma 04/06/2016  . Laryngopharyngeal reflux (LPR) 04/06/2016  . PCP NOTES >>>>> 03/09/2015  . Annual physical exam 12/21/2014  . Cardiomegaly, by MRI 03/16/2014  . Leukopenia 03/10/2014  . Lightheadedness 02/10/2011  . SINOATRIAL NODE DYSFUNCTION 08/06/2008  . ATRIAL SEPTAL DEFECT 06/29/2007  . Hypothyroidism 03/21/2007  . Essential hypertension 03/21/2007  . Allergic rhinitis 03/21/2007  . GERD-- dysphagia 03/21/2007  . CONSTIPATION 03/21/2007  . IRRITABLE BOWEL SYNDROME 03/21/2007  . RA (rheumatoid arthritis) ([place]) 03/21/2007  . LOW BACK PAIN 03/21/2007  . FIBROMYALGIA 03/21/2007    Past Medical History:  Diagnosis Date  . Allergic rhinitis, cause unspecified   . Anemia   . Asthma   . Atrial septal defect    s/p repair;   Echo 08/07/10: EF 55-60%; mild MR; atrial septal aneurysm; no residual ASD  . Chest pain, unspecified    cardiac cath 07/31/10: normal coronaries; vigorous LVF  . Esophageal reflux   . Fibromyalgia   . Headache(784.0)   . Hematuria, unspecified   . Hypertension    no meds   . Hypothyroidism   . Irritable bowel syndrome   . Low back pain   . Rheumatoid arthritis(714.0)    Dr Herold Harms  . Scoliosis   .  Sinoatrial node dysfunction (HCC)    eval. for chronotropic competence completed in past    Family History  Problem Relation Age of Onset  . Hypertension Mother   . Diabetes Mother   . Heart disease Maternal Grandmother   . Stomach cancer Maternal Grandmother 95  . Glaucoma Father   . Breast cancer Other        aunt  .  Diabetes Other        GM  . Hypertension Sister   . Thyroid disease Sister   . Hypertension Sister   . Thyroid disease Sister   . Colon cancer Neg Hx    Past Surgical History:  Procedure Laterality Date  . ASD REPAIR  1985  . TONSILLECTOMY AND ADENOIDECTOMY    . TUBAL LIGATION     Social History   Social History Narrative   Household-- pt and youngest son    Immunization History  Administered Date(s) Administered  . Influenza Split 03/17/2011, 03/17/2012  . Influenza Whole 04/05/2009, 02/21/2010  . Influenza,inj,Quad PF,6+ Mos 03/21/2013, 03/09/2014, 05/24/2015, 01/27/2016  . Pneumococcal Conjugate-13 01/27/2016  . Pneumococcal Polysaccharide-23 03/08/2015  . Td 07/23/2010     Objective: Vital Signs: BP 131/81 (BP Location: Left Arm, Patient Position: Sitting, Cuff Size: Normal)   Pulse (!) 48   Resp 12   Ht 5\' 9"  (1.753 m)   Wt 134 lb 9.6 oz (61.1 kg)   BMI 19.88 kg/m    Physical Exam Vitals signs and nursing note reviewed.  Constitutional:      Appearance: She is well-developed.  HENT:     Head: Normocephalic and atraumatic.  Eyes:     Comments: Right eye conjunctival injection  Neck:     Musculoskeletal: Normal range of motion.  Cardiovascular:     Rate and Rhythm: Normal rate and regular rhythm.     Heart sounds: Normal heart sounds.  Pulmonary:     Effort: Pulmonary effort is normal.     Breath sounds: Normal breath sounds.  Abdominal:     General: Bowel sounds are normal.     Palpations: Abdomen is soft.  Lymphadenopathy:     Cervical: No cervical adenopathy.  Skin:    General: Skin is warm and dry.     Capillary Refill: Capillary refill takes less than 2 seconds.  Neurological:     Mental Status: She is alert and oriented to person, place, and time.  Psychiatric:        Behavior: Behavior normal.      Musculoskeletal Exam: C-spine some discomfort range of motion.  She had no point tenderness over the thoracic spine.  But she is having  discomfort in the thoracic region.  Shoulder joints elbow joints wrist joint MCPs PIPs DIPs with good range of motion with knees no synovitis.  Hip joints knee joints ankles MTPs PIPs were in good range of motion with no synovitis.  CDAI Exam: CDAI Score: 0.7  Patient Global Assessment: 5 (mm); Provider Global Assessment: 2 (mm) Swollen: 0 ; Tender: 0  Joint Exam   Not documented   There is currently no information documented on the homunculus. Go to the Rheumatology activity and complete the homunculus joint exam.  Investigation: No additional findings.  Imaging: Xr Thoracic Spine 2 View  Result Date: 10/05/2018 Dextro scoliosis was noted.  Multilevel mild spondylosis was noted. Impression: Moderate spondylosis and dextroscoliosis of the thoracic spine.   Recent Labs: Lab Results  Component Value Date   WBC 2.5 (L) 06/01/2018  HGB 12.0 06/01/2018   PLT 151 06/01/2018   NA 141 06/01/2018   K 4.7 06/01/2018   CL 105 06/01/2018   CO2 29 06/01/2018   GLUCOSE 86 06/01/2018   BUN 11 06/01/2018   CREATININE 0.85 06/01/2018   BILITOT 0.6 06/01/2018   ALKPHOS 60 11/30/2017   AST 27 06/01/2018   ALT 17 06/01/2018   PROT 7.8 06/01/2018   ALBUMIN 4.1 11/30/2017   CALCIUM 10.0 06/01/2018   GFRAA 90 06/01/2018   QFTBGOLDPLUS NEGATIVE 06/23/2018    Speciality Comments: Dexa- 05/27/18 Osteopenia T-Score -1.7 BMD 0.963   Procedures:  No procedures performed Allergies: Etanercept; Methotrexate derivatives; and Pregabalin   Assessment / Plan:     Visit Diagnoses: Rheumatoid arthritis involving multiple sites with positive rheumatoid factor +CCP (HCC)-she had no known joint tenderness or swelling.  She has been off methotrexate since January due to neutropenia.  She did not want to have frequent labs to monitor neutropenia.  Iritis - Dr. Teodoro Spray, Recurrent iritis.  Patient states she continues to have iritis.  Her recent flare was a week ago.  She had conjunctival injection  on examination today in her right eye.  High risk medication use -came off methotrexate due to neutropenia in January.. At last visit we discussed adding Humira due to eye involvement.  She was approved for Humira but did not start due to missed appointment and then sinus infection.  Last TB gold negative 06/23/2018.  Most recent CBC/CMP  Within normal limits except WBC trending down and was 2.5 on 06/01/2018.  She was to have CBC every month to monitor and she is overdue.  CBC/CMP ordered for today. - Plan: CBC with Differential/Platelet, COMPLETE METABOLIC PANEL WITH GFR.  If her labs are stable we will bring her in for the next hemorrhoid injection.  Primary osteoarthritis of both hands-she has DIP and PIP thickening in her hands.  Primary osteoarthritis of both feet-she has some discomfort in her feet.  DDD (degenerative disc disease), cervical-she has stiffness in her cervical spine.  Pain in thoracic spine -she has been experiencing thoracic pain on her right side.  She requests x-ray of her thoracic spine.  Plan: XR Thoracic Spine 2 View.  The x-ray showed mild spondylosis and dextroscoliosis of the thoracic spine.  History of scoliosis  Other neutropenia (HCC)  History of hypothyroidism  History of hypertension  History of migraine  History of gastroesophageal reflux (GERD)  History of bradycardia/ sinoatrial node dysfunction   History of IBS   Orders: Orders Placed This Encounter  Procedures  . XR Thoracic Spine 2 View  . CBC with Differential/Platelet  . COMPLETE METABOLIC PANEL WITH GFR   No orders of the defined types were placed in this encounter.   Face-to-face time spent with patient was 30 minutes. Greater than 50% of time was spent in counseling and coordination of care.  Follow-Up Instructions: Return in about 3 months (around 01/05/2019) for Rheumatoid arthritis, Osteoarthritis.   Bo Merino, MD  Note - This record has been created using Radio producer.  Chart creation errors have been sought, but may not always  have been located. Such creation errors do not reflect on  the standard of medical care.

## 2018-10-06 ENCOUNTER — Ambulatory Visit (HOSPITAL_BASED_OUTPATIENT_CLINIC_OR_DEPARTMENT_OTHER)
Admission: RE | Admit: 2018-10-06 | Discharge: 2018-10-06 | Disposition: A | Discharge status: Home / Self Care | Payer: BLUE CROSS/BLUE SHIELD | Source: Ambulatory Visit | Attending: Internal Medicine | Admitting: Internal Medicine

## 2018-10-06 ENCOUNTER — Ambulatory Visit (INDEPENDENT_AMBULATORY_CARE_PROVIDER_SITE_OTHER): Payer: BLUE CROSS/BLUE SHIELD | Admitting: Internal Medicine

## 2018-10-06 ENCOUNTER — Telehealth: Payer: Self-pay

## 2018-10-06 DIAGNOSIS — R1310 Dysphagia, unspecified: Secondary | ICD-10-CM

## 2018-10-06 LAB — CBC WITH DIFFERENTIAL/PLATELET
Absolute Monocytes: 468 cells/uL (ref 200–950)
Basophils Absolute: 22 cells/uL (ref 0–200)
Basophils Relative: 0.6 %
Eosinophils Absolute: 40 cells/uL (ref 15–500)
Eosinophils Relative: 1.1 %
HCT: 39 % (ref 35.0–45.0)
Hemoglobin: 12.9 g/dL (ref 11.7–15.5)
Lymphs Abs: 1076 cells/uL (ref 850–3900)
MCH: 29.3 pg (ref 27.0–33.0)
MCHC: 33.1 g/dL (ref 32.0–36.0)
MCV: 88.6 fL (ref 80.0–100.0)
MPV: 12.1 fL (ref 7.5–12.5)
Monocytes Relative: 13 %
Neutro Abs: 1994 cells/uL (ref 1500–7800)
Neutrophils Relative %: 55.4 %
Platelets: 144 10*3/uL (ref 140–400)
RBC: 4.4 10*6/uL (ref 3.80–5.10)
RDW: 11.7 % (ref 11.0–15.0)
Total Lymphocyte: 29.9 %
WBC: 3.6 10*3/uL — ABNORMAL LOW (ref 3.8–10.8)

## 2018-10-06 LAB — COMPLETE METABOLIC PANEL WITH GFR
AG Ratio: 1.1 (calc) (ref 1.0–2.5)
ALT: 15 U/L (ref 6–29)
AST: 25 U/L (ref 10–35)
Albumin: 4.4 g/dL (ref 3.6–5.1)
Alkaline phosphatase (APISO): 69 U/L (ref 37–153)
BUN: 11 mg/dL (ref 7–25)
CO2: 28 mmol/L (ref 20–32)
Calcium: 10.1 mg/dL (ref 8.6–10.4)
Chloride: 105 mmol/L (ref 98–110)
Creat: 0.89 mg/dL (ref 0.50–1.05)
GFR, Est African American: 85 mL/min/{1.73_m2} (ref >=60)
GFR, Est Non African American: 73 mL/min/{1.73_m2} (ref >=60)
Globulin: 4.1 g/dL (calc) — ABNORMAL HIGH (ref 1.9–3.7)
Glucose, Bld: 98 mg/dL (ref 65–99)
Potassium: 4 mmol/L (ref 3.5–5.3)
Sodium: 141 mmol/L (ref 135–146)
Total Bilirubin: 0.7 mg/dL (ref 0.2–1.2)
Total Protein: 8.5 g/dL — ABNORMAL HIGH (ref 6.1–8.1)

## 2018-10-06 MED ORDER — PANTOPRAZOLE SODIUM 40 MG PO TBEC: 40.0000 mg | DELAYED_RELEASE_TABLET | Freq: Every day | ORAL | 3 refills | Status: DC

## 2018-10-06 NOTE — Progress Notes (Signed)
Subjective:    Patient ID: Brittany Hodges, female    DOB: 1966-03-29, 53 y.o.   MRN: [id number]  DOS:  10/06/2018 Type of visit - description: Virtual Visit via Video Note  I connected with@ on 10/06/18 at 11:00 AM EDT by a video enabled telemedicine application and verified that I am speaking with the correct person using two identifiers.   THIS ENCOUNTER IS A VIRTUAL VISIT DUE TO COVID-19 - PATIENT WAS NOT SEEN IN THE OFFICE. PATIENT HAS CONSENTED TO VIRTUAL VISIT / TELEMEDICINE VISIT   Location of patient: home  Location of provider: office  I discussed the limitations of evaluation and management by telemedicine and the availability of in person appointments. The patient expressed understanding and agreed to proceed.  History of Present Illness: Acute visit 3 days history of pain at the upper back, R side, near the shoulder blade. The pain is triggered by certain movements and by swallowing. She cannot swallow large amount of solids or fluids, with a small amounts the pain is  still triggered but not as severe. Reports that the food seems to be going down "slow". Had x-ray of the spine yesterday at rheumatology.  Medications list reviewed.  Not on methotrexate  Asthma: Well-controlled, currently requiring no medications  COVID-19: Sheltering at home, working from home, doing well.     Review of Systems No fever chills No anterior chest pain, no difficulty breathing No nausea, vomiting, diarrhea.  No abdominal pain. No heartburn per se. No cough Stools normal in color  Past Medical History:  Diagnosis Date  . Allergic rhinitis, cause unspecified   . Anemia   . Asthma   . Atrial septal defect    s/p repair;   Echo 08/07/10: EF 55-60%; mild MR; atrial septal aneurysm; no residual ASD  . Chest pain, unspecified    cardiac cath 07/31/10: normal coronaries; vigorous LVF  . Esophageal reflux   . Fibromyalgia   . Headache(784.0)   . Hematuria, unspecified   .  Hypertension    no meds   . Hypothyroidism   . Irritable bowel syndrome   . Low back pain   . Rheumatoid arthritis(714.0)    Dr Herold Harms  . Scoliosis   . Sinoatrial node dysfunction (HCC)    eval. for chronotropic competence completed in past    Past Surgical History:  Procedure Laterality Date  . ASD REPAIR  1985  . TONSILLECTOMY AND ADENOIDECTOMY    . TUBAL LIGATION      Social History   Socioeconomic History  . Marital status: Single    Spouse name: Not on file  . Number of children: 3  . Years of education: Not on file  . Highest education level: Not on file  Occupational History  . 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 Financial resource strain: Not on file  . Food insecurity:    Worry: Not on file    Inability: Not on file  . Transportation needs:    Medical: Not on file    Non-medical: Not on file  Tobacco Use  . Smoking status: Never Smoker  . Smokeless tobacco: Never Used  . Tobacco comment: never used tobacco  Substance and Sexual Activity  . Alcohol use: No  . Drug use: No  . Sexual activity: Not Currently  Lifestyle  . Physical activity:    Days per week: Not on file    Minutes per session: Not on file  . Stress: Not  on file  Relationships  . Social connections:    Talks on phone: Not on file    Gets together: Not on file    Attends religious service: Not on file    Active member of club or organization: Not on file    Attends meetings of clubs or organizations: Not on file    Relationship status: Not on file  . Intimate partner violence:    Fear of current or ex partner: Not on file    Emotionally abused: Not on file    Physically abused: Not on file    Forced sexual activity: Not on file  Other Topics Concern  . Not on file  Social History Narrative   Household-- pt and youngest son       Allergies as of 10/06/2018      Reactions   Etanercept    Methotrexate Derivatives    Caused her WBC to drop    Pregabalin       Medication List       Accurate as of Oct 06, 2018 10:57 AM. If you have any questions, ask your nurse or doctor.        AeroChamber MV inhaler Use as instructed   albuterol 108 (90 Base) MCG/ACT inhaler Commonly known as:  ProAir HFA Inhale 2 puffs into the lungs every 6 (six) hours as needed. For shortness of breath and wheezing   amLODipine 5 MG tablet Commonly known as:  NORVASC Take 1 tablet (5 mg total) by mouth daily.   azelastine 0.1 % nasal spray Commonly known as:  ASTELIN Place 2 sprays into both nostrils at bedtime as needed for rhinitis. Use in each nostril as directed   fluticasone 50 MCG/ACT nasal spray Commonly known as:  FLONASE Place 2 sprays into both nostrils daily as needed.   fluticasone 50 MCG/BLIST diskus inhaler Commonly known as:  FLOVENT DISKUS Inhale into the lungs as needed.   folic acid 1 MG tablet Commonly known as:  FOLVITE Take 1 tablet (1 mg total) by mouth daily.   GINGER ROOT PO Take by mouth.   HOMATROPINE HBR OP Place 1 drop into the left eye every morning.   methotrexate 2.5 MG tablet Commonly known as:  RHEUMATREX TAKE 3 TABLETS (7.5 MG TOTAL) BY MOUTH ONCE A WEEK. CAUTION:CHEMOTHERAPY. PROTECT FROM LIGHT.   MULTIVITAMIN PO Take by mouth daily.   prednisoLONE acetate 1 % ophthalmic suspension Commonly known as:  PRED FORTE daily.   TART CHERRY ADVANCED PO Take by mouth.   TURMERIC PO Take by mouth.   V-R NASAL SPRAY SALINE 0.65 % nasal spray Generic drug:  sodium chloride Place into the nose as needed. As directed   Vitamin D 50 MCG (2000 UT) Caps Take by mouth daily.           Objective:   Physical Exam There were no vitals taken for this visit. This is a virtual video visit, patient is alert oriented x3, no apparent distress, BP yesterday at rheumatology 131/81. The patient was able to point out to the area of the pain which is just under the right shoulder blade.    Assessment       Assessment   Prediabetes: A1c 6.0 (01-2017) HTN Hypothyroidism GERD- dysphagia -Dr. Norville Haggard EGD from 04/02/2014 (Dr Delrae Alfred): Narrowing of proximal esophagus, question of extrinsic extrinsic compression, EUS showing no intrinsic esophageal mass but the spine was seen in close proximity to the esophagus probably resulting in extrinsic compression. The endoscopist recommend  repeat CT neck and chest in 3 months.saw ENT 04-2015 for dysphagia,had a scope,  rx PPI Asthma, uses alb rarely  CV: --Atrial septal defect: Status post repair, no residual ASD --Sinoatrial  Dysfunction, sees Dr Caryl Comes   --CP: cath 2012, normal coronaries MSK: --Rheumatoid arthritis,   Dr. Herold Harms + rheumatoid factor +CCP , has use  Humira before, h/o  low WBC with Methotrexate, and injection site reaction to Enbrel   --Fibromyalgia IBS (Miralax prn constipation)   Admission: syncope 04-2016 (unlikely to be arrhythmia related per cards )  PLAN:  Dysphasia, odynophagia: Right upper back pain likely related to a GI issue.  She has a previous h/o  dysphagia, the problem has been silent for a while but apparently seems to be returning and needs to be further investigated. I also wonder about pill esophagitis or esophageal ulcer. Plan: Chest x-ray for completeness and to assess the mediastinum, start Protonix daily, refer to GI at [facility]. Asthma: Asymptomatic RTC already scheduled for 11-2018 CPX.   I discussed the assessment and treatment plan with the patient. The patient was provided an opportunity to ask questions and all were answered. The patient agreed with the plan and demonstrated an understanding of the instructions.   The patient was advised to call back or seek an in-person evaluation if the symptoms worsen or if the condition fails to improve as anticipated.

## 2018-10-06 NOTE — Progress Notes (Signed)
WBC better. Ok to sch for first Humira injection

## 2018-10-06 NOTE — Telephone Encounter (Signed)
Copied from [place] [id number]. Topic: Appointment Scheduling - Scheduling Inquiry for Clinic >> Oct 05, 2018  4:08 PM Alanda Slim E wrote: Reason for CRM: Pt needs an appt asap. Pt states her back hurts when she swallows/ please advise

## 2018-10-06 NOTE — Telephone Encounter (Signed)
Needs virtual visit 

## 2018-10-06 NOTE — Telephone Encounter (Signed)
Virtual visit scheduled today.

## 2018-10-07 NOTE — Assessment & Plan Note (Signed)
Dysphasia, odynophagia: Right upper back pain likely related to a GI issue.  She has a previous h/o  dysphagia, the problem has been silent for a while but apparently seems to be returning and needs to be further investigated. I also wonder about pill esophagitis or esophageal ulcer. Plan: Chest x-ray for completeness and to assess the mediastinum, start Protonix daily, refer to [facility] at [facility]. Asthma: Asymptomatic RTC already scheduled for 11-2018 CPX.

## 2018-10-11 ENCOUNTER — Telehealth: Payer: Self-pay

## 2018-10-11 NOTE — Telephone Encounter (Signed)
I have called patient about switching appointment on 10/18/18 to telehealth visit.

## 2018-10-13 ENCOUNTER — Ambulatory Visit: Payer: BLUE CROSS/BLUE SHIELD

## 2018-10-13 NOTE — Progress Notes (Signed)
Pharmacy Note  Subjective:   Patient is being initiated on Humira for rheumatoid arthritis and iritis.  Patient was previously counseled extensively on and consented to initiation of Humira at that time.  Patient presents to clinic today to receive the first dose of  Humira.  She was previously on methotrexate but stopped on her own due to low white blood cell count.  Her RA symptoms were well managed with MTX but had recurrent iritis.  She has tried Humira in the past but discontinued due to painful injections.  She is doing another trial with Humira and hoping the citrate free version will be less painful.  Objective: CMP     Component Value Date/Time   NA 141 10/05/2018 1551   NA 140 04/15/2016 0000   K 4.0 10/05/2018 1551   CL 105 10/05/2018 1551   CO2 28 10/05/2018 1551   GLUCOSE 98 10/05/2018 1551   BUN 11 10/05/2018 1551   BUN 13 04/15/2016 0000   CREATININE 0.89 10/05/2018 1551   CALCIUM 10.1 10/05/2018 1551   PROT 8.5 (H) 10/05/2018 1551   PROT 8.4 04/15/2016 0000   ALBUMIN 4.1 11/30/2017 1645   ALBUMIN 4.3 04/15/2016 0000   AST 25 10/05/2018 1551   ALT 15 10/05/2018 1551   ALKPHOS 60 11/30/2017 1645   BILITOT 0.7 10/05/2018 1551   BILITOT 0.2 04/15/2016 0000   GFRNONAA 73 10/05/2018 1551   GFRAA 85 10/05/2018 1551    CBC    Component Value Date/Time   WBC 3.6 (L) 10/05/2018 1551   RBC 4.40 10/05/2018 1551   HGB 12.9 10/05/2018 1551   HGB 12.4 04/15/2016 0000   HGB 11.2 (L) 07/26/2013 0802   HCT 39.0 10/05/2018 1551   HCT 38.3 04/15/2016 0000   HCT 35.5 07/26/2013 0802   PLT 144 10/05/2018 1551   PLT 189 04/15/2016 0000   MCV 88.6 10/05/2018 1551   MCV 87 04/15/2016 0000   MCV 86 07/26/2013 0802   MCH 29.3 10/05/2018 1551   MCHC 33.1 10/05/2018 1551   RDW 11.7 10/05/2018 1551   RDW 14.0 04/15/2016 0000   RDW 13.6 07/26/2013 0802   LYMPHSABS 1,076 10/05/2018 1551   LYMPHSABS 1.6 04/15/2016 0000   LYMPHSABS 0.9 07/26/2013 0802   MONOABS 0.6 11/30/2017  1645   EOSABS 40 10/05/2018 1551   EOSABS 0.1 04/15/2016 0000   EOSABS 0.0 07/26/2013 0802   BASOSABS 22 10/05/2018 1551   BASOSABS 0.0 04/15/2016 0000   BASOSABS 0.0 07/26/2013 0802    Baseline Immunosuppressant Therapy Labs TB GOLD Quantiferon TB Gold Latest Ref Rng & Units 06/23/2018  Quantiferon TB Gold Plus NEGATIVE NEGATIVE   Hepatitis Panel Hepatitis Latest Ref Rng & Units 06/23/2018  Hep B Surface Ag NON-REACTI NON-REACTIVE  Hep B IgM NON-REACTI NON-REACTIVE  Hep C Ab NON-REACTI NON-REACTIVE  Hep C Ab NON-REACTI NON-REACTIVE  Hep A IgM NON-REACTI NON-REACTIVE   HIV Lab Results  Component Value Date   HIV NONREACTIVE 12/21/2014   Immunoglobulins Immunoglobulin Electrophoresis Latest Ref Rng & Units 06/23/2018  IgA  47 - 310 mg/dL 400(H)  IgG 600 - 1,640 mg/dL 2,196(H)  IgM 50 - 300 mg/dL 118   SPEP Serum Protein Electrophoresis Latest Ref Rng & Units 10/05/2018  Total Protein 6.1 - 8.1 g/dL 8.5(H)  Albumin 55.8 - 66.1 % -  Alpha-1 2.9 - 4.9 % -  Alpha-2 7.1 - 11.8 % -  Beta Globulin 4.7 - 7.2 % -  Beta 2 3.2 - 6.5 % -  Gamma Globulin 11.1 - 18.8 % -   G6PD No results found for: G6PDH TPMT No results found for: TPMT   Chest x-ray: no active cardiopulmonary disease 10/06/2018  Patient running a fever or have signs/symptoms of infection? No  Assessment/Plan:  Demonstrated proper injection technique with Humira demo pen.  Patient able to demonstrate proper injection technique using the teach back method. Patient self injected in the lower abdomen with:  Sample Medication: Humira 40 mg/0.4 ml NDC: 7737-3668-15 Lot: 10/2019 Expiration: 10/2019  Patient tolerated well.  Observed for 30 mins in office for adverse reaction and none noted. She did state the injection site was itchy.  Informed patient that she may use OTC hydrocortisone cream after the injection for itching.  She should also take an anti-histamine day prior, day of, and day after injection to help  minimize injection site reaction since she had issues in the past.  Patient verbalized understanding.   Patient is to return in 1 month for labs.  Standing orders placed. Prescription sent to [facility] mandated per insurance.  She was given a co-pay card to help cover some of her co-pay card.  Instructed patient to call if she has not heard from the pharmacy by Friday to set up shipment.  Patient verbalized understanding.  All questions encouraged and answered.  Instructed patient to call with any further questions or concerns.  Mariella Saa, PharmD, [facility] Clinical Pharmacist  10/13/2018 1:28 PM

## 2018-10-17 NOTE — Telephone Encounter (Signed)
I called and spoke with patient, she is ok with office visit being switched to telehealth visit tomorrow.      Virtual Visit Pre-Appointment Phone Call  "(Name), I am calling you today to discuss your upcoming appointment. We are currently trying to limit exposure to the virus that causes COVID-19 by seeing patients at home rather than in the office."  1. "What is the BEST phone number to call the day of the visit?" - include this in appointment notes  2. "Do you have or have access to (through a family member/friend) a smartphone with video capability that we can use for your visit?" a. If yes - list this number in appt notes as "cell" (if different from BEST phone #) and list the appointment type as a VIDEO visit in appointment notes b. If no - list the appointment type as a PHONE visit in appointment notes  3. Confirm consent - "In the setting of the current Covid19 crisis, you are scheduled for a (phone or video) visit with your provider on (date) at (time).  Just as we do with many in-office visits, in order for you to participate in this visit, we must obtain consent.  If you'd like, I can send this to your mychart (if signed up) or email for you to review.  Otherwise, I can obtain your verbal consent now.  All virtual visits are billed to your insurance company just like a normal visit would be.  By agreeing to a virtual visit, we'd like you to understand that the technology does not allow for your provider to perform an examination, and thus may limit your provider's ability to fully assess your condition. If your provider identifies any concerns that need to be evaluated in person, we will make arrangements to do so.  Finally, though the technology is pretty good, we cannot assure that it will always work on either your or our end, and in the setting of a video visit, we may have to convert it to a phone-only visit.  In either situation, we cannot ensure that we have a secure connection.   Are you willing to proceed?" STAFF: Did the patient verbally acknowledge consent to telehealth visit? Document YES/NO here: YES  4. Advise patient to be prepared - "Two hours prior to your appointment, go ahead and check your blood pressure, pulse, oxygen saturation, and your weight (if you have the equipment to check those) and write them all down. When your visit starts, your provider will ask you for this information. If you have an Apple Watch or Kardia device, please plan to have heart rate information ready on the day of your appointment. Please have a pen and paper handy nearby the day of the visit as well."  5. Give patient instructions for MyChart download to smartphone OR Doximity/Doxy.me as below if video visit (depending on what platform provider is using)  6. Inform patient they will receive a phone call 15 minutes prior to their appointment time (may be from unknown caller ID) so they should be prepared to answer    TELEPHONE CALL NOTE  Brittany Hodges has been deemed a candidate for a follow-up tele-health visit to limit community exposure during the Covid-19 pandemic. I spoke with the patient via phone to ensure availability of phone/video source, confirm preferred email & phone number, and discuss instructions and expectations.  I reminded Brittany Hodges to be prepared with any vital sign and/or heart rhythm information that could potentially be obtained  via home monitoring, at the time of her visit. I reminded Brittany Hodges to expect a phone call prior to her visit.  Mady Haagensen, [place] 10/17/2018 1:59 PM   INSTRUCTIONS FOR DOWNLOADING THE MYCHART APP TO SMARTPHONE  - The patient must first make sure to have activated MyChart and know their login information - If Apple, go to [employer] and type in MyChart in the search bar and download the app. If Android, ask patient to go to [employer] and type in [place] in the search bar and download the app. The app is  free but as with any other app downloads, their phone may require them to verify saved payment information or Apple/Android password.  - The patient will need to then log into the app with their MyChart username and password, and select [facility] as their healthcare provider to link the account. When it is time for your visit, go to the MyChart app, find appointments, and click Begin Video Visit. Be sure to Select Allow for your device to access the Microphone and Camera for your visit. You will then be connected, and your provider will be with you shortly.  **If they have any issues connecting, or need assistance please contact MyChart service desk [phone])**  **If using a computer, in order to ensure the best quality for their visit they will need to use either of the following Internet Browsers: [employer], or Google Chrome**  IF USING DOXIMITY or DOXY.ME - The patient will receive a link just prior to their visit by text.     FULL LENGTH CONSENT FOR TELE-HEALTH VISIT   I hereby voluntarily request, consent and authorize [facility] and its employed or contracted physicians, physician assistants, nurse practitioners or other licensed health care professionals (the Practitioner), to provide me with telemedicine health care services (the "Services") as deemed necessary by the treating Practitioner. I acknowledge and consent to receive the Services by the Practitioner via telemedicine. I understand that the telemedicine visit will involve communicating with the Practitioner through live audiovisual communication technology and the disclosure of certain medical information by electronic transmission. I acknowledge that I have been given the opportunity to request an in-person assessment or other available alternative prior to the telemedicine visit and am voluntarily participating in the telemedicine visit.  I understand that I have the right to withhold or withdraw my  consent to the use of telemedicine in the course of my care at any time, without affecting my right to future care or treatment, and that the Practitioner or I may terminate the telemedicine visit at any time. I understand that I have the right to inspect all information obtained and/or recorded in the course of the telemedicine visit and may receive copies of available information for a reasonable fee.  I understand that some of the potential risks of receiving the Services via telemedicine include:  Marland Kitchen Delay or interruption in medical evaluation due to technological equipment failure or disruption; . Information transmitted may not be sufficient (e.g. poor resolution of images) to allow for appropriate medical decision making by the Practitioner; and/or  . In rare instances, security protocols could fail, causing a breach of personal health information.  Furthermore, I acknowledge that it is my responsibility to provide information about my medical history, conditions and care that is complete and accurate to the best of my ability. I acknowledge that Practitioner's advice, recommendations, and/or decision may be based on factors not within their control, such  as incomplete or inaccurate data provided by me or distortions of diagnostic images or specimens that may result from electronic transmissions. I understand that the practice of medicine is not an exact science and that Practitioner makes no warranties or guarantees regarding treatment outcomes. I acknowledge that I will receive a copy of this consent concurrently upon execution via email to the email address I last provided but may also request a printed copy by calling the office of [facility].    I understand that my insurance will be billed for this visit.   I have read or had this consent read to me. . I understand the contents of this consent, which adequately explains the benefits and risks of the Services being provided via telemedicine.   . I have been provided ample opportunity to ask questions regarding this consent and the Services and have had my questions answered to my satisfaction. . I give my informed consent for the services to be provided through the use of telemedicine in my medical care  By participating in this telemedicine visit I agree to the above.

## 2018-10-18 ENCOUNTER — Encounter: Payer: Self-pay | Admitting: Internal Medicine

## 2018-10-18 ENCOUNTER — Other Ambulatory Visit: Payer: Self-pay

## 2018-10-18 ENCOUNTER — Telehealth (INDEPENDENT_AMBULATORY_CARE_PROVIDER_SITE_OTHER): Payer: BLUE CROSS/BLUE SHIELD | Admitting: Internal Medicine

## 2018-10-18 VITALS — BP 141/80 | HR 48 | Ht 69.0 in | Wt 130.4 lb

## 2018-10-18 DIAGNOSIS — Q211 Atrial septal defect: Secondary | ICD-10-CM

## 2018-10-18 DIAGNOSIS — I10 Essential (primary) hypertension: Secondary | ICD-10-CM | POA: Diagnosis not present

## 2018-10-18 DIAGNOSIS — Q2111 Secundum atrial septal defect: Secondary | ICD-10-CM

## 2018-10-18 DIAGNOSIS — R001 Bradycardia, unspecified: Secondary | ICD-10-CM

## 2018-10-18 NOTE — Progress Notes (Signed)
Electrophysiology TeleHealth Note   Due to national recommendations of social distancing due to COVID 19, an audio/video telehealth visit is felt to be most appropriate for this patient at this time.  See MyChart message from today for the patient's consent to telehealth for [facility].   Date:  10/18/2018   ID:  Brittany Hodges, DOB [birth year], MRN [id number]  Location: patient's home  Provider location: [address] [place]  Evaluation Performed: Follow-up visit  PCP:  Colon Branch, MD  Cardiologist:     Electrophysiologist:  SK   Chief Complaint:   BRADYCARDIA   History of Present Illness:    Brittany Hodges is a 53 y.o. female who presents via audio/video conferencing for a telehealth visit today.  Since last being seen in our clinic for [place]  the patient reports doing quite well  She has her mom living with her now.  She has dementia and is doing home dialysis for 2+ months;  Pt is working from home so a blessing from [place]  More arthritis nd to see Dr SD tomorrow for reinitiation of humira  The patient denies chest pain, shortness of breath, nocturnal dyspnea, orthopnea or peripheral edema.  There have been no palpitations, lightheadedness or syncope.    Date Cr K  11/18/ 0.79 3.9  5/20 0.89 4.0     The patient denies symptoms of fevers, chills, cough, or new SOB worrisome for COVID 19.    Past Medical History:  Diagnosis Date  . Allergic rhinitis, cause unspecified   . Anemia   . Asthma   . Atrial septal defect    s/p repair;   Echo 08/07/10: EF 55-60%; mild MR; atrial septal aneurysm; no residual ASD  . Chest pain, unspecified    cardiac cath 07/31/10: normal coronaries; vigorous LVF  . Esophageal reflux   . Fibromyalgia   . Headache(784.0)   . Hematuria, unspecified   . Hypertension    no meds   . Hypothyroidism   . Irritable bowel syndrome   . Low back pain   . Rheumatoid arthritis(714.0)     Dr Herold Harms  . Scoliosis   . Sinoatrial node dysfunction (HCC)    eval. for chronotropic competence completed in past    Past Surgical History:  Procedure Laterality Date  . ASD REPAIR  1985  . TONSILLECTOMY AND ADENOIDECTOMY    . TUBAL LIGATION      Current Outpatient Medications  Medication Sig Dispense Refill  . albuterol (PROAIR HFA) 108 (90 Base) MCG/ACT inhaler Inhale 2 puffs into the lungs every 6 (six) hours as needed. For shortness of breath and wheezing 1 Inhaler 6  . amLODipine (NORVASC) 5 MG tablet Take 1 tablet (5 mg total) by mouth daily. 90 tablet 3  . azelastine (ASTELIN) 0.1 % nasal spray Place 2 sprays into both nostrils at bedtime as needed for rhinitis. Use in each nostril as directed 30 mL 3  . Cholecalciferol (VITAMIN D) 50 MCG (2000 UT) CAPS Take by mouth daily.    . fluticasone (FLONASE) 50 MCG/ACT nasal spray Place 2 sprays into both nostrils daily as needed.    . fluticasone (FLOVENT DISKUS) 50 MCG/BLIST diskus inhaler Inhale into the lungs as needed.     . Ginger, Zingiber officinalis, (GINGER ROOT PO) Take by mouth.    Marland Kitchen HOMATROPINE HBR OP Place 1 drop into the left eye every morning.     . Misc Natural Products (  TART CHERRY ADVANCED PO) Take by mouth.    . pantoprazole (PROTONIX) 40 MG tablet Take 1 tablet (40 mg total) by mouth daily. Take before breakfast 30 tablet 3  . prednisoLONE acetate (PRED FORTE) 1 % ophthalmic suspension Place 1 drop into both eyes every other day.     Marland Kitchen Spacer/Aero-Holding Chambers (AEROCHAMBER MV) inhaler Use as instructed 1 each 0  . TURMERIC PO Take by mouth.     No current facility-administered medications for this visit.     Allergies:   Etanercept; Methotrexate derivatives; and Pregabalin   Social History:  The patient  reports that she has never smoked. She has never used smokeless tobacco. She reports that she does not drink alcohol or use drugs.   Family History:  The patient's   family history includes Breast  cancer in an other family member; Diabetes in her mother and another family member; Glaucoma in her father; Heart disease in her maternal grandmother; Hypertension in her mother, sister, and sister; Stomach cancer (age of onset: 53) in her maternal grandmother; Thyroid disease in her sister and sister.   ROS:  Please see the history of present illness.   All other systems are personally reviewed and negative.    Exam:    Vital Signs:  BP (!) 141/80   Pulse (!) 48   Ht 5\' 9"  (1.753 m)   Wt 130 lb 6.4 oz (59.1 kg)   BMI 19.26 kg/m    Well appearing, alert and conversant, regular work of breathing,  good skin color Eyes- anicteric, neuro- grossly intact, skin- no apparent rash or lesions or cyanosis, mouth- oral mucosa is pink   Labs/Other Tests and Data Reviewed:    Recent Labs: 06/23/2018: TSH 4.65 10/05/2018: ALT 15; BUN 11; Creat 0.89; Hemoglobin 12.9; Platelets 144; Potassium 4.0; Sodium 141   Wt Readings from Last 3 Encounters:  10/18/18 130 lb 6.4 oz (59.1 kg)  10/05/18 134 lb 9.6 oz (61.1 kg)  06/23/18 134 lb (60.8 kg)     Other studies personally reviewed: Additional studies/ records that were reviewed today include:     ASSESSMENT & PLAN:    Sinus node dysfunction  Atrial septal defect repair   Hypertension  Things are going well   BP up a little, think it may be related to stress and inflammation  She will follow  No obvious limitations relted to her sinus node dysfunction , but will plan ECG and GXT in 6 m  Suggested she consider palliative care for her mom; and to discuss with Dr Charlynn Court and [facility] COVID 19 screen The patient denies symptoms of COVID 19 at this time.  The importance of social distancing was discussed today.  Follow-up:  30m    Current medicines are reviewed at length with the patient today.   The patient does not have concerns regarding her medicines.  The following changes were made today:  none  Labs/ tests ordered today include:   No  orders of the defined types were placed in this encounter.   Future tests ( post COVID )  GXT in office in 6  months  Patient Risk:  after full review of this patients clinical status, I feel that they are at moderate  risk at this time.  Today, I have spent 10 minutes with the patient with telehealth technology discussing the above.  Signed, Virl Axe, MD  10/18/2018 3:52 PM     [address] [place] [address] (  [phone] (office) [phone] (fax)

## 2018-10-19 ENCOUNTER — Ambulatory Visit (INDEPENDENT_AMBULATORY_CARE_PROVIDER_SITE_OTHER): Payer: BLUE CROSS/BLUE SHIELD | Admitting: Pharmacist

## 2018-10-19 DIAGNOSIS — M0579 Rheumatoid arthritis with rheumatoid factor of multiple sites without organ or systems involvement: Secondary | ICD-10-CM

## 2018-10-19 MED ORDER — ADALIMUMAB 40 MG/0.4ML ~~LOC~~ AJKT: 40.0000 mg | AUTO-INJECTOR | SUBCUTANEOUS | 0 refills | Status: DC

## 2018-10-19 NOTE — Patient Instructions (Addendum)
Standing Labs We placed an order today for your standing lab work.    Please come back and get your standing labs in 1 month and then every 3 months.  We have open lab daily Monday through Thursday from 8:30-12:30 PM and 1:30-4:30 PM and Friday from 8:30-12:30 PM and 1:30 -4:00 PM at the office of Dr. Bo Merino.   You may experience shorter wait times on Monday and Friday afternoons. The office is located at [address], [place], [place], [place] [address] No appointment is necessary.   Labs are drawn by [facility].  You may receive a bill from [facility] for your lab work.  If you wish to have your labs drawn at another location, please call the office 24 hours in advance to send orders.  If you have any questions regarding directions or hours of operation,  please call [phone].   Just as a reminder please drink plenty of water prior to coming for your lab work. Thanks!  Helpful Tips for Injecting To help alleviate pain and injection site reactions consider the following tips:  . Placing something cold (like and ice gel pack or cold water bottle) on the injection site just before cleansing with alcohol may help reduce pain . If you have a localized reaction (redness, mild swelling, warmth, and itching) you can use topical corticosteroids (hydrocortisone cream) or antihistamine (Claritin) to help minimize reaction the day of, the day before, and the day after injecting . Always inject this medication at room temperature (remove from refrigerator 15-20 minutes before injecting; this may help eliminate stinging). . Always rotate your injection sites using inside/outside thigh of both legs, abdomen divided into 4 quadrants (stay away from waist line, and 2" away from navel). . Do not inject into areas where skin is tender, bruised, red, or hard or where there are scars or stretch marks. . Always let alcohol dry on the skin before injecting. . Place a cold, damp towel or small ice  pack on the injection site for 10 or 15 minutes every 1 to 2 hours if it hurts or is swollen.  Vaccines You are taking a medication(s) that can suppress your immune system.  The following immunizations are recommended: . Flu annually . Pneumonia (Pneumovax 23 and Prevnar 13 spaced at least 1 year apart) . Shingrix  Please check with your PCP to make sure you are up to date.

## 2018-10-24 ENCOUNTER — Telehealth: Payer: Self-pay | Admitting: Pharmacist

## 2018-10-24 NOTE — Telephone Encounter (Signed)
Received a fax from [employer] regarding a prior authorization for [place]. Authorization has been APPROVED from 09/24/2018 to 01/22/2019.   Will send document to scan center.  Authorization # [id number]

## 2018-10-24 NOTE — Telephone Encounter (Signed)
Received a Prior Authorization request from [employer] for [place]. Authorization has been submitted to patient's insurance via fax. Will update once we receive a response.  Case ID # [id number]

## 2018-11-24 ENCOUNTER — Other Ambulatory Visit: Payer: Self-pay

## 2018-11-25 ENCOUNTER — Encounter: Payer: Self-pay | Admitting: Family Medicine

## 2018-11-25 ENCOUNTER — Ambulatory Visit: Payer: BC Managed Care – PPO | Admitting: Family Medicine

## 2018-11-25 VITALS — BP 110/68 | HR 45 | Temp 98.4°F | Ht 69.0 in | Wt 128.0 lb

## 2018-11-25 DIAGNOSIS — S76312A Strain of muscle, fascia and tendon of the posterior muscle group at thigh level, left thigh, initial encounter: Secondary | ICD-10-CM

## 2018-11-25 NOTE — Patient Instructions (Signed)
Heat (pad or rice pillow in microwave) over affected area, 10-15 minutes twice daily.   OK to take Tylenol 1000 mg (2 extra strength tabs) or 975 mg (3 regular strength tabs) every 6 hours as needed.  Send me a message if you are not improving over the next 3-4 weeks. We will refer you to the sports medicine team.   Semimembranosus Tendinitis Rehab  It is normal to feel mild stretching, pulling, tightness, or discomfort as you do these exercises, but you should stop right away if you feel sudden pain or your pain gets worse.  Stretching and range of motion exercises These exercises warm up your muscles and joints and improve the movement and flexibility of your thigh. These exercises also help to relieve pain, numbness, and tingling. Exercise A: Hamstring stretch, supine    1. Lie on your back. Loop a belt or towel across the ball of your left / right foot The ball of your foot is on the walking surface, right under your toes. 2. Straighten your left / right knee and slowly pull on the belt to raise your leg. Stop when you feel a gentle stretch behind your left / right knee or thigh. ? Do not allow the knee to bend. ? Keep your other leg flat on the floor. 3. Hold this position for 30 seconds. Repeat 2 times. Complete this exercise 3 times a week. Strengthening exercises These exercises build strength and endurance in your thigh. Endurance is the ability to use your muscles for a long time, even after they get tired. Exercise B: Straight leg raises (hip extensors) 1. Lie on your belly on a bed or a firm surface with a pillow under your hips. 2. Bend your left / right knee so your foot is straight up in the air. 3. Squeeze your buttock muscles and lift your left / right thigh off the bed. Do not let your back arch. 4. Hold this position for 3 seconds. 5. Slowly return to the starting position. Let your muscles relax completely before you do another repetition. Repeat 2 times. Complete this  exercise 3 times a week. Exercise C: Bridge (hip extensors)     1. Lie on your back on a firm surface with your knees bent and your feet flat on the floor. 2. Tighten your buttocks muscles and lift your bottom off the floor until your trunk is level with your thighs. ? You should feel the muscles working in your buttocks and the back of your thighs. If you do not feel these muscles, slide your feet 1-2 inches (2.5-5 cm) farther away from your buttocks. ? Do not arch your back. 3. Hold this position for 3 seconds. 4. Slowly lower your hips to the starting position. 5. Let your buttocks muscles relax completely between repetitions. If this exercise is too easy, try doing it with your arms crossed over your chest. Repeat 2 times. Complete this exercise 3 times a week. Exercise D: Hamstring eccentric, prone 1. Lie on your belly on a bed or on the floor. 2. Start with your legs straight. Cross your legs at the ankles with your left / right leg on top. 3. Using your bottom leg to do the work, bend both knees. 4. Using just your left / right leg alone, slowly lower your leg back down toward the bed. Add a 5 lb weight as told by your health care provider. 5. Let your muscles relax completely between repetitions. Repeat 2 times. Complete this exercise  3 times a week. Exercise E: Squats 1. Stand in front of a table, with your feet and knees pointing straight ahead. You may rest your hands on the table for balance but not for support. 2. Slowly bend your knees and lower your hips like you are going to sit in a chair. Keep your thighs straight or pointed slightly outward. ? Keep your weight over your heels, not over your toes. ? Keep your lower legs upright so they are parallel with the table legs. ? Do not let your hips go lower than your knees. Stop when your knees are bent to the shape of an upside-down letter "L" (90 degree angle). ? Do not bend lower than told by your health care provider. ? If  your knee pain increases, do not bend as low. 3. Hold the squat position 1-2 seconds. 4. Slowly push with your legs to return to standing. Do not use your hands to pull yourself to standing. Repeat 2 times. Complete this exercise 3 times a week. Make sure you discuss any questions you have with your health care provider. Document Released: 05/04/2005 Document Revised: 01/09/2016 Document Reviewed: 02/05/2015 Elsevier Interactive Patient Education  [employer].

## 2018-11-25 NOTE — Progress Notes (Signed)
Chief Complaint  Patient presents with  . back of left thigh rash    Brittany Hodges is a 53 y.o. female here for a skin complaint.  Duration: 1 week Location: back of L thigh Pruritic? No Painful? No Drainage? No New soaps/lotions/topicals/detergents? No Sick contacts? No Other associated symptoms: describes as raw/irritating Constant, sitting makes it worse, walking makes it beteer Therapies tried thus far: none  ROS:  Const: No fevers Skin: As noted in HPI  Past Medical History:  Diagnosis Date  . Allergic rhinitis, cause unspecified   . Anemia   . Asthma   . Atrial septal defect    s/p repair;   Echo 08/07/10: EF 55-60%; mild MR; atrial septal aneurysm; no residual ASD  . Chest pain, unspecified    cardiac cath 07/31/10: normal coronaries; vigorous LVF  . Esophageal reflux   . Fibromyalgia   . Headache(784.0)   . Hematuria, unspecified   . Hypertension    no meds   . Hypothyroidism   . Irritable bowel syndrome   . Low back pain   . Rheumatoid arthritis(714.0)    Dr Herold Harms  . Scoliosis   . Sinoatrial node dysfunction (HCC)    eval. for chronotropic competence completed in past    BP 110/68 (BP Location: Left Arm, Patient Position: Sitting, Cuff Size: Normal)   Pulse (!) 45   Temp 98.4 F (36.9 C) (Oral)   Ht 5\' 9"  (1.753 m)   Wt 128 lb (58.1 kg)   SpO2 98%   BMI 18.90 kg/m  Gen: awake, alert, appearing stated age Lungs: No accessory muscle use Skin: No lesions present on L posterior thigh. No drainage, excessive warmth, erythema, TTP, fluctuance, excoriation MSK: +ttp over deeper palpation of L hamstring. Mild ttp reproduced with L knee flexion. No deformity or asymmetry Psych: Age appropriate judgment and insight  Strain of left hamstring, initial encounter - Plan: stretches/exercises, heat, Tylenol prn. If no improvement, will message in 3-4 weeks and will refer to sports med.   Orders as above. F/u prn. The patient voiced understanding and  agreement to the plan.  [place], DO 11/25/18 9:57 AM

## 2018-12-05 ENCOUNTER — Encounter: Payer: BLUE CROSS/BLUE SHIELD | Admitting: Internal Medicine

## 2018-12-05 ENCOUNTER — Encounter: Payer: Self-pay | Admitting: Internal Medicine

## 2018-12-05 DIAGNOSIS — Z0289 Encounter for other administrative examinations: Secondary | ICD-10-CM

## 2018-12-07 ENCOUNTER — Other Ambulatory Visit: Payer: Self-pay

## 2018-12-07 DIAGNOSIS — Z79899 Other long term (current) drug therapy: Secondary | ICD-10-CM

## 2018-12-08 LAB — COMPLETE METABOLIC PANEL WITH GFR
AG Ratio: 1.1 (calc) (ref 1.0–2.5)
ALT: 16 U/L (ref 6–29)
AST: 27 U/L (ref 10–35)
Albumin: 4.1 g/dL (ref 3.6–5.1)
Alkaline phosphatase (APISO): 60 U/L (ref 37–153)
BUN: 15 mg/dL (ref 7–25)
CO2: 28 mmol/L (ref 20–32)
Calcium: 9.9 mg/dL (ref 8.6–10.4)
Chloride: 107 mmol/L (ref 98–110)
Creat: 0.77 mg/dL (ref 0.50–1.05)
GFR, Est African American: 101 mL/min/{1.73_m2} (ref >=60)
GFR, Est Non African American: 87 mL/min/{1.73_m2} (ref >=60)
Globulin: 3.6 g/dL (calc) (ref 1.9–3.7)
Glucose, Bld: 90 mg/dL (ref 65–99)
Potassium: 3.7 mmol/L (ref 3.5–5.3)
Sodium: 140 mmol/L (ref 135–146)
Total Bilirubin: 0.6 mg/dL (ref 0.2–1.2)
Total Protein: 7.7 g/dL (ref 6.1–8.1)

## 2018-12-08 LAB — CBC WITH DIFFERENTIAL/PLATELET
Absolute Monocytes: 406 cells/uL (ref 200–950)
Basophils Absolute: 20 cells/uL (ref 0–200)
Basophils Relative: 0.7 %
Eosinophils Absolute: 41 cells/uL (ref 15–500)
Eosinophils Relative: 1.4 %
HCT: 35.1 % (ref 35.0–45.0)
Hemoglobin: 11.8 g/dL (ref 11.7–15.5)
Lymphs Abs: 1047 cells/uL (ref 850–3900)
MCH: 28.5 pg (ref 27.0–33.0)
MCHC: 33.6 g/dL (ref 32.0–36.0)
MCV: 84.8 fL (ref 80.0–100.0)
MPV: 12.2 fL (ref 7.5–12.5)
Monocytes Relative: 14 %
Neutro Abs: 1386 cells/uL — ABNORMAL LOW (ref 1500–7800)
Neutrophils Relative %: 47.8 %
Platelets: 120 10*3/uL — ABNORMAL LOW (ref 140–400)
RBC: 4.14 10*6/uL (ref 3.80–5.10)
RDW: 12.7 % (ref 11.0–15.0)
Total Lymphocyte: 36.1 %
WBC: 2.9 10*3/uL — ABNORMAL LOW (ref 3.8–10.8)

## 2018-12-08 NOTE — Progress Notes (Signed)
CMP WNL.  WBC count is chronically low but trending down. Reviewed labs with Dr. Estanislado Pandy. Please advise patient to return in 1 month to recheck CBC Plts are low.  We will continue to monitor.

## 2018-12-22 NOTE — Progress Notes (Deleted)
Office Visit Note  Patient: Brittany Hodges             Date of Birth: [birth year]           MRN: [id number]             PCP: Colon Branch, MD Referring: Colon Branch, MD Visit Date: 01/05/2019 Occupation: @GUAROCC @  Subjective:  No chief complaint on file.  Humira 40 mg every 14 days started in June. She was on methotrexate but discontinued due to neutropenia.  Last TB gold negative 06/23/2018 and will monitor yearly.  Most CBC/CMP within normal limits except for low platelets and WBC count on 12/07/2018.  WBC trending down since starting Humira. She is due for repeat CBC today.  Will monitor CBC/CMP every 3 months. Standing orders in place. She is up to date with pneumonia vaccines.  Recommend annual flu and Shingrix vaccine.   Recommend yearly skin exams due to increased risk of melanoma.  History of Present Illness: Brittany Hodges is a 53 y.o. female ***   Activities of Daily Living:  Patient reports morning stiffness for *** {minute/hour:19697}.   Patient {ACTIONS;DENIES/REPORTS:21021675::"Denies"} nocturnal pain.  Difficulty dressing/grooming: {ACTIONS;DENIES/REPORTS:21021675::"Denies"} Difficulty climbing stairs: {ACTIONS;DENIES/REPORTS:21021675::"Denies"} Difficulty getting out of chair: {ACTIONS;DENIES/REPORTS:21021675::"Denies"} Difficulty using hands for taps, buttons, cutlery, and/or writing: {ACTIONS;DENIES/REPORTS:21021675::"Denies"}  No Rheumatology ROS completed.   PMFS History:  Patient Active Problem List   Diagnosis Date Noted  . Asthma 04/06/2016  . Laryngopharyngeal reflux (LPR) 04/06/2016  . PCP NOTES >>>>> 03/09/2015  . Annual physical exam 12/21/2014  . Cardiomegaly, by MRI 03/16/2014  . Leukopenia 03/10/2014  . Lightheadedness 02/10/2011  . SINOATRIAL NODE DYSFUNCTION 08/06/2008  . ATRIAL SEPTAL DEFECT 06/29/2007  . Hypothyroidism 03/21/2007  . Essential hypertension 03/21/2007  . Allergic rhinitis 03/21/2007  . GERD-- dysphagia 03/21/2007  .  CONSTIPATION 03/21/2007  . IRRITABLE BOWEL SYNDROME 03/21/2007  . RA (rheumatoid arthritis) ([place]) 03/21/2007  . LOW BACK PAIN 03/21/2007  . FIBROMYALGIA 03/21/2007    Past Medical History:  Diagnosis Date  . Allergic rhinitis, cause unspecified   . Anemia   . Asthma   . Atrial septal defect    s/p repair;   Echo 08/07/10: EF 55-60%; mild MR; atrial septal aneurysm; no residual ASD  . Chest pain, unspecified    cardiac cath 07/31/10: normal coronaries; vigorous LVF  . Esophageal reflux   . Fibromyalgia   . Headache(784.0)   . Hematuria, unspecified   . Hypertension    no meds   . Hypothyroidism   . Irritable bowel syndrome   . Low back pain   . Rheumatoid arthritis(714.0)    Dr Herold Harms  . Scoliosis   . Sinoatrial node dysfunction (HCC)    eval. for chronotropic competence completed in past    Family History  Problem Relation Age of Onset  . Hypertension Mother   . Diabetes Mother   . Heart disease Maternal Grandmother   . Stomach cancer Maternal Grandmother 95  . Glaucoma Father   . Breast cancer Other        aunt  . Diabetes Other        GM  . Hypertension Sister   . Thyroid disease Sister   . Hypertension Sister   . Thyroid disease Sister   . Colon cancer Neg Hx    Past Surgical History:  Procedure Laterality Date  . ASD REPAIR  1985  . TONSILLECTOMY AND ADENOIDECTOMY    . TUBAL LIGATION     Social  History   Social History Narrative   Household-- pt and youngest son    Immunization History  Administered Date(s) Administered  . Influenza Split 03/17/2011, 03/17/2012  . Influenza Whole 04/05/2009, 02/21/2010  . Influenza,inj,Quad PF,6+ Mos 03/21/2013, 03/09/2014, 05/24/2015, 01/27/2016  . Pneumococcal Conjugate-13 01/27/2016  . Pneumococcal Polysaccharide-23 03/08/2015  . Td 07/23/2010     Objective: Vital Signs: There were no vitals taken for this visit.   Physical Exam   Musculoskeletal Exam: ***  CDAI Exam: CDAI Score: - Patient Global:  -; Provider Global: - Swollen: -; Tender: - Joint Exam   No joint exam has been documented for this visit   There is currently no information documented on the homunculus. Go to the Rheumatology activity and complete the homunculus joint exam.  Investigation: No additional findings.  Imaging: No results found.  Recent Labs: Lab Results  Component Value Date   WBC 2.9 (L) 12/07/2018   HGB 11.8 12/07/2018   PLT 120 (L) 12/07/2018   NA 140 12/07/2018   K 3.7 12/07/2018   CL 107 12/07/2018   CO2 28 12/07/2018   GLUCOSE 90 12/07/2018   BUN 15 12/07/2018   CREATININE 0.77 12/07/2018   BILITOT 0.6 12/07/2018   ALKPHOS 60 11/30/2017   AST 27 12/07/2018   ALT 16 12/07/2018   PROT 7.7 12/07/2018   ALBUMIN 4.1 11/30/2017   CALCIUM 9.9 12/07/2018   GFRAA 101 12/07/2018   QFTBGOLDPLUS NEGATIVE 06/23/2018    Speciality Comments: Dexa- 05/27/18 Osteopenia T-Score -1.7 BMD 0.963  Prior therapy includes: Plaquenil (inadequate response) , Enbrel (injection site reaction) and methotrexate (neutropenia so on low dose).  Procedures:  No procedures performed Allergies: Etanercept, Methotrexate derivatives, and Pregabalin   Assessment / Plan:     Visit Diagnoses: No diagnosis found.  Orders: No orders of the defined types were placed in this encounter.  No orders of the defined types were placed in this encounter.   Face-to-face time spent with patient was *** minutes. Greater than 50% of time was spent in counseling and coordination of care.  Follow-Up Instructions: No follow-ups on file.   Earnestine Mealing, CMA  Note - This record has been created using Editor, commissioning.  Chart creation errors have been sought, but may not always  have been located. Such creation errors do not reflect on  the standard of medical care.

## 2018-12-28 ENCOUNTER — Other Ambulatory Visit: Payer: Self-pay | Admitting: Internal Medicine

## 2018-12-29 ENCOUNTER — Other Ambulatory Visit: Payer: Self-pay | Admitting: Rheumatology

## 2018-12-29 DIAGNOSIS — M0579 Rheumatoid arthritis with rheumatoid factor of multiple sites without organ or systems involvement: Secondary | ICD-10-CM

## 2019-01-02 ENCOUNTER — Other Ambulatory Visit: Payer: Self-pay | Admitting: Rheumatology

## 2019-01-02 DIAGNOSIS — M0579 Rheumatoid arthritis with rheumatoid factor of multiple sites without organ or systems involvement: Secondary | ICD-10-CM

## 2019-01-05 ENCOUNTER — Ambulatory Visit: Payer: BLUE CROSS/BLUE SHIELD | Admitting: Rheumatology

## 2019-01-11 NOTE — Progress Notes (Deleted)
Office Visit Note  Patient: Brittany Hodges             Date of Birth: [birth year]           MRN: [id number]             PCP: Colon Branch, MD Referring: Colon Branch, MD Visit Date: 01/25/2019 Occupation: @GUAROCC @  Subjective:  No chief complaint on file.  Humira 40 mg every 14 days started in June. She was on methotrexate but discontinued due to neutropenia.  Last TB gold negative 06/23/2018 and will monitor yearly.  Most CBC/CMP within normal limits except for low platelets and WBC count on 12/07/2018.  WBC trending down since starting Humira. She is due for repeat CBC today.  Will monitor CBC/CMP every 3 months. Standing orders in place. She is up to date with pneumonia vaccines.  Recommend annual flu and Shingrix vaccine.   Recommend yearly skin exams due to increased risk of melanoma.  History of Present Illness: Brittany Hodges is a 53 y.o. female ***   Activities of Daily Living:  Patient reports morning stiffness for *** {minute/hour:19697}.   Patient {ACTIONS;DENIES/REPORTS:21021675::"Denies"} nocturnal pain.  Difficulty dressing/grooming: {ACTIONS;DENIES/REPORTS:21021675::"Denies"} Difficulty climbing stairs: {ACTIONS;DENIES/REPORTS:21021675::"Denies"} Difficulty getting out of chair: {ACTIONS;DENIES/REPORTS:21021675::"Denies"} Difficulty using hands for taps, buttons, cutlery, and/or writing: {ACTIONS;DENIES/REPORTS:21021675::"Denies"}  No Rheumatology ROS completed.   PMFS History:  Patient Active Problem List   Diagnosis Date Noted  . Asthma 04/06/2016  . Laryngopharyngeal reflux (LPR) 04/06/2016  . PCP NOTES >>>>> 03/09/2015  . Annual physical exam 12/21/2014  . Cardiomegaly, by MRI 03/16/2014  . Leukopenia 03/10/2014  . Lightheadedness 02/10/2011  . SINOATRIAL NODE DYSFUNCTION 08/06/2008  . ATRIAL SEPTAL DEFECT 06/29/2007  . Hypothyroidism 03/21/2007  . Essential hypertension 03/21/2007  . Allergic rhinitis 03/21/2007  . GERD-- dysphagia 03/21/2007  .  CONSTIPATION 03/21/2007  . IRRITABLE BOWEL SYNDROME 03/21/2007  . RA (rheumatoid arthritis) ([place]) 03/21/2007  . LOW BACK PAIN 03/21/2007  . FIBROMYALGIA 03/21/2007    Past Medical History:  Diagnosis Date  . Allergic rhinitis, cause unspecified   . Anemia   . Asthma   . Atrial septal defect    s/p repair;   Echo 08/07/10: EF 55-60%; mild MR; atrial septal aneurysm; no residual ASD  . Chest pain, unspecified    cardiac cath 07/31/10: normal coronaries; vigorous LVF  . Esophageal reflux   . Fibromyalgia   . Headache(784.0)   . Hematuria, unspecified   . Hypertension    no meds   . Hypothyroidism   . Irritable bowel syndrome   . Low back pain   . Rheumatoid arthritis(714.0)    Dr Herold Harms  . Scoliosis   . Sinoatrial node dysfunction (HCC)    eval. for chronotropic competence completed in past    Family History  Problem Relation Age of Onset  . Hypertension Mother   . Diabetes Mother   . Heart disease Maternal Grandmother   . Stomach cancer Maternal Grandmother 95  . Glaucoma Father   . Breast cancer Other        aunt  . Diabetes Other        GM  . Hypertension Sister   . Thyroid disease Sister   . Hypertension Sister   . Thyroid disease Sister   . Colon cancer Neg Hx    Past Surgical History:  Procedure Laterality Date  . ASD REPAIR  1985  . TONSILLECTOMY AND ADENOIDECTOMY    . TUBAL LIGATION     Social  History   Social History Narrative   Household-- pt and youngest son    Immunization History  Administered Date(s) Administered  . Influenza Split 03/17/2011, 03/17/2012  . Influenza Whole 04/05/2009, 02/21/2010  . Influenza,inj,Quad PF,6+ Mos 03/21/2013, 03/09/2014, 05/24/2015, 01/27/2016  . Pneumococcal Conjugate-13 01/27/2016  . Pneumococcal Polysaccharide-23 03/08/2015  . Td 07/23/2010     Objective: Vital Signs: There were no vitals taken for this visit.   Physical Exam   Musculoskeletal Exam: ***  CDAI Exam: CDAI Score: - Patient Global:  -; Provider Global: - Swollen: -; Tender: - Joint Exam   No joint exam has been documented for this visit   There is currently no information documented on the homunculus. Go to the Rheumatology activity and complete the homunculus joint exam.  Investigation: No additional findings.  Imaging: No results found.  Recent Labs: Lab Results  Component Value Date   WBC 2.9 (L) 12/07/2018   HGB 11.8 12/07/2018   PLT 120 (L) 12/07/2018   NA 140 12/07/2018   K 3.7 12/07/2018   CL 107 12/07/2018   CO2 28 12/07/2018   GLUCOSE 90 12/07/2018   BUN 15 12/07/2018   CREATININE 0.77 12/07/2018   BILITOT 0.6 12/07/2018   ALKPHOS 60 11/30/2017   AST 27 12/07/2018   ALT 16 12/07/2018   PROT 7.7 12/07/2018   ALBUMIN 4.1 11/30/2017   CALCIUM 9.9 12/07/2018   GFRAA 101 12/07/2018   QFTBGOLDPLUS NEGATIVE 06/23/2018    Speciality Comments: Dexa- 05/27/18 Osteopenia T-Score -1.7 BMD 0.963  Prior therapy includes: Plaquenil (inadequate response) , Enbrel (injection site reaction) and methotrexate (neutropenia so on low dose).  Procedures:  No procedures performed Allergies: Etanercept, Methotrexate derivatives, and Pregabalin   Assessment / Plan:     Visit Diagnoses: No diagnosis found.  Orders: No orders of the defined types were placed in this encounter.  No orders of the defined types were placed in this encounter.   Face-to-face time spent with patient was *** minutes. Greater than 50% of time was spent in counseling and coordination of care.  Follow-Up Instructions: No follow-ups on file.   Earnestine Mealing, CMA  Note - This record has been created using Editor, commissioning.  Chart creation errors have been sought, but may not always  have been located. Such creation errors do not reflect on  the standard of medical care.

## 2019-01-17 ENCOUNTER — Other Ambulatory Visit: Payer: Self-pay

## 2019-01-17 ENCOUNTER — Ambulatory Visit (INDEPENDENT_AMBULATORY_CARE_PROVIDER_SITE_OTHER): Payer: BC Managed Care – PPO | Admitting: Internal Medicine

## 2019-01-17 ENCOUNTER — Encounter: Payer: Self-pay | Admitting: Internal Medicine

## 2019-01-17 VITALS — BP 146/87 | HR 64 | Temp 98.0°F | Resp 16 | Ht 69.0 in | Wt 129.0 lb

## 2019-01-17 DIAGNOSIS — R739 Hyperglycemia, unspecified: Secondary | ICD-10-CM

## 2019-01-17 DIAGNOSIS — Z0000 Encounter for general adult medical examination without abnormal findings: Secondary | ICD-10-CM

## 2019-01-17 DIAGNOSIS — E039 Hypothyroidism, unspecified: Secondary | ICD-10-CM

## 2019-01-17 NOTE — Patient Instructions (Addendum)
GO TO THE LAB : Get the blood work     GO TO THE FRONT DESK Schedule your next appointment   for a physical exam in 1 year   Check the  blood pressure 2 or 3 times a month  BP GOAL is between 110/65 and  135/85. If it is consistently higher or lower, let me know   

## 2019-01-17 NOTE — Progress Notes (Signed)
Subjective:    Patient ID: Brittany Hodges, female    DOB: 03-25-1966, 53 y.o.   MRN: [id number]  DOS:  01/17/2019 Type of visit - description: CPX In general feels well. She self discontinue Humira a month ago due to pain and swelling at the site of injection Reports a few days ago, her eyes got red.  She self started topical prednisone as she is suspected episcleritis. She denies photophobia, visual disturbances, itchy eyes or pain. + Tearing noted    Review of Systems  Other than above, a 14 point review of systems is negative     Past Medical History:  Diagnosis Date  . Allergic rhinitis, cause unspecified   . Anemia   . Asthma   . Atrial septal defect    s/p repair;   Echo 08/07/10: EF 55-60%; mild MR; atrial septal aneurysm; no residual ASD  . Chest pain, unspecified    cardiac cath 07/31/10: normal coronaries; vigorous LVF  . Esophageal reflux   . Fibromyalgia   . Headache(784.0)   . Hematuria, unspecified   . Hypertension    no meds   . Hypothyroidism   . Irritable bowel syndrome   . Low back pain   . Rheumatoid arthritis(714.0)    Dr Herold Harms  . Scoliosis   . Sinoatrial node dysfunction (HCC)    eval. for chronotropic competence completed in past    Past Surgical History:  Procedure Laterality Date  . ASD REPAIR  1985  . TONSILLECTOMY AND ADENOIDECTOMY    . TUBAL LIGATION      Social History   Socioeconomic History  . Marital status: Single    Spouse name: Not on file  . Number of children: 3  . Years of education: Not on file  . Highest education level: Not on file  Occupational History  . Occupation: HR assistant   Social Needs  . Financial resource strain: Not on file  . Food insecurity    Worry: Not on file    Inability: Not on file  . Transportation needs    Medical: Not on file    Non-medical: Not on file  Tobacco Use  . Smoking status: Never Smoker  . Smokeless tobacco: Never Used  . Tobacco comment: never used tobacco   Substance and Sexual Activity  . Alcohol use: No  . Drug use: No  . Sexual activity: Not Currently  Lifestyle  . Physical activity    Days per week: Not on file    Minutes per session: Not on file  . Stress: Not on file  Relationships  . Social Herbalist on phone: Not on file    Gets together: Not on file    Attends religious service: Not on file    Active member of club or organization: Not on file    Attends meetings of clubs or organizations: Not on file    Relationship status: Not on file  . Intimate partner violence    Fear of current or ex partner: Not on file    Emotionally abused: Not on file    Physically abused: Not on file    Forced sexual activity: Not on file  Other Topics Concern  . Not on file  Social History Narrative   Household-- pt , her mother and her youngest son      Family History  Problem Relation Age of Onset  . Hypertension Mother   . Diabetes Mother   . Heart disease Maternal  Grandmother   . Stomach cancer Maternal Grandmother 95  . Glaucoma Father   . Breast cancer Other        aunt  . Diabetes Other        GM  . Hypertension Sister   . Thyroid disease Sister   . Hypertension Sister   . Thyroid disease Sister   . Colon cancer Neg Hx      Allergies as of 01/17/2019      Reactions   Etanercept    Methotrexate Derivatives    Caused her WBC to drop   Pregabalin       Medication List       Accurate as of January 17, 2019 11:59 PM. If you have any questions, ask your nurse or doctor.        Adalimumab 40 MG/0.4ML Pnkt Commonly known as: Humira Pen Inject 40 mg into the skin every 14 (fourteen) days.   AeroChamber MV inhaler Use as instructed   albuterol 108 (90 Base) MCG/ACT inhaler Commonly known as: ProAir HFA Inhale 2 puffs into the lungs every 6 (six) hours as needed. For shortness of breath and wheezing   amLODipine 5 MG tablet Commonly known as: NORVASC Take 1 tablet (5 mg total) by mouth daily.    azelastine 0.1 % nasal spray Commonly known as: ASTELIN Place 2 sprays into both nostrils at bedtime as needed for rhinitis. Use in each nostril as directed   fluticasone 50 MCG/ACT nasal spray Commonly known as: FLONASE Place 2 sprays into both nostrils daily as needed.   fluticasone 50 MCG/BLIST diskus inhaler Commonly known as: FLOVENT DISKUS Inhale into the lungs as needed.   GINGER ROOT PO Take by mouth.   HOMATROPINE HBR OP Place 1 drop into the left eye every morning.   pantoprazole 40 MG tablet Commonly known as: PROTONIX Take 1 tablet (40 mg total) by mouth daily. Take before breakfast   prednisoLONE acetate 1 % ophthalmic suspension Commonly known as: PRED FORTE Place 1 drop into both eyes every other day.   TART CHERRY ADVANCED PO Take by mouth.   TURMERIC PO Take by mouth.   Vitamin D 50 MCG (2000 UT) Caps Take by mouth daily.           Objective:   Physical Exam BP (!) 146/87 (BP Location: Left Arm, Patient Position: Sitting, Cuff Size: Small)   Pulse 64   Temp 98 F (36.7 C) (Temporal)   Resp 16   Ht 5\' 9"  (1.753 m)   Wt 129 lb (58.5 kg)   SpO2 100%   BMI 19.05 kg/m  General: Well developed, NAD, BMI noted Neck: No  thyromegaly  HEENT:  Normocephalic . Face symmetric, atraumatic. Sclerae: Erythematous, more so at the temporal side of the left eye. EOMI. Pupils small but equal and reactive. Anterior chambers normal. Lungs:  CTA B Normal respiratory effort, no intercostal retractions, no accessory muscle use. Heart: RRR,  no murmur.  No pretibial edema bilaterally  Abdomen:  Not distended, soft, non-tender. No rebound or rigidity.   Skin: Exposed areas without rash. Not pale. Not jaundice Neurologic:  alert & oriented X3.  Speech normal, gait appropriate for age and unassisted Strength symmetric and appropriate for age.  Psych: Cognition and judgment appear intact.  Cooperative with normal attention span and concentration.   Behavior appropriate. No anxious or depressed appearing.     Assessment    Assessment   Prediabetes: A1c 6.0 (01-2017) HTN Hypothyroidism GERD- dysphagia -Dr. Norville Haggard  EGD from 04/02/2014 (Dr Delrae Alfred): Narrowing of proximal esophagus, question of extrinsic extrinsic compression, EUS showing no intrinsic esophageal mass but the spine was seen in close proximity to the esophagus probably resulting in extrinsic compression. The endoscopist recommend repeat CT neck and chest in 3 months.saw ENT 04-2015 for dysphagia,had a scope,  rx PPI Asthma, uses alb rarely  CV: --Atrial septal defect: Status post repair, no residual ASD --Sinoatrial  Dysfunction, sees Dr Caryl Comes   --CP: cath 2012, normal coronaries MSK: --Rheumatoid arthritis,   Dr. Herold Harms + rheumatoid factor +CCP,   h/o  low WBC with Methotrexate,   injection site reaction to Enbrel  , self d/c Humira ~ 12/2018 --Fibromyalgia IBS (Miralax prn constipation)   Admission: syncope 04-2016 (unlikely to be arrhythmia related per cards )  PLAN:  Prediabetes: Reports she is doing well with diet and exercise, checking A1c HTN: On amlodipine, ambulatory BPs in the 120s/60s.  No change, last BMP satisfactory Hypothyroidism: Slightly increased TSH, recheck TFTs.  On no supplements. GERD, dysphagia, odynophagia: GI referral failed, see last visit, patient is now asymptomatic.  Agreed not to pursue further GI evaluation at this point. Asthma: Currently not using any medication Cardiovascular: Last visit with cardiology 10-2018, felt to be stable. Rheumatoid arthritis: Self discontinued Humira a month ago, will see rheumatology soon. Episcleritis: I am concerned about this findings however patient feels this is a regular flareup and is using topical steroids, recommend to see ophthalmologist soon if she is not getting better and keep follow-up w/ them  in few days. RTC 1 year

## 2019-01-17 NOTE — Progress Notes (Signed)
Pre visit review using our clinic review tool, if applicable. No additional management support is needed unless otherwise documented below in the visit note. 

## 2019-01-18 LAB — LIPID PANEL
Cholesterol: 148 mg/dL (ref 0–200)
HDL: 65.3 mg/dL
LDL Cholesterol: 68 mg/dL (ref 0–99)
NonHDL: 82.59
Total CHOL/HDL Ratio: 2
Triglycerides: 73 mg/dL (ref 0.0–149.0)
VLDL: 14.6 mg/dL (ref 0.0–40.0)

## 2019-01-18 LAB — T4, FREE: Free T4: 0.84 ng/dL (ref 0.60–1.60)

## 2019-01-18 LAB — HEMOGLOBIN A1C: Hgb A1c MFr Bld: 6 % (ref 4.6–6.5)

## 2019-01-18 LAB — TSH: TSH: 7.08 u[IU]/mL — ABNORMAL HIGH (ref 0.35–4.50)

## 2019-01-18 LAB — T3, FREE: T3, Free: 3.8 pg/mL (ref 2.3–4.2)

## 2019-01-18 NOTE — Assessment & Plan Note (Signed)
-   Td 2012  - Pneumovax   2016  prevnar 01-2016 - shingrix : never got it , not interested, benefits discussed - flu shot: declined, states will get it another day -Female care: Sees gynecology Dr Bethann Goo 11/2018, [facility] 09/2018 -CCS: Cscope  05/07/2011 negative, 10 years, Dr. Shana Chute  - Diet and exercise: take walks qod, watching diet - labs: A1c, FLP, TFTs

## 2019-01-18 NOTE — Assessment & Plan Note (Signed)
Prediabetes: Reports she is doing well with diet and exercise, checking A1c HTN: On amlodipine, ambulatory BPs in the 120s/60s.  No change, last BMP satisfactory Hypothyroidism: Slightly increased TSH, recheck TFTs.  On no supplements. GERD, dysphagia, odynophagia: GI referral failed, see last visit, patient is now asymptomatic.  Agreed not to pursue further GI evaluation at this point. Asthma: Currently not using any medication Cardiovascular: Last visit with cardiology 10-2018, felt to be stable. Rheumatoid arthritis: Self discontinued Humira a month ago, will see rheumatology soon. Episcleritis: I am concerned about this findings however patient feels this is a regular flareup and is using topical steroids, recommend to see ophthalmologist soon if she is not getting better and keep follow-up w/ them  in few days. RTC 1 year

## 2019-01-20 NOTE — Addendum Note (Signed)
Addended byDamita Dunnings D on: 01/20/2019 01:03 PM   Modules accepted: Orders

## 2019-01-25 ENCOUNTER — Ambulatory Visit: Payer: BC Managed Care – PPO | Admitting: Physician Assistant

## 2019-01-26 ENCOUNTER — Telehealth: Payer: Self-pay | Admitting: Internal Medicine

## 2019-01-26 NOTE — Telephone Encounter (Signed)
-----   Message from [employer] [place], [place] sent at 01/20/2019  1:04 PM EDT ----- Results released. Free T4 and TSH ordered. Sending to [facility] for scheduling.

## 2019-01-26 NOTE — Telephone Encounter (Signed)
Called pt no answer left msg

## 2019-01-30 ENCOUNTER — Telehealth: Payer: Self-pay | Admitting: Internal Medicine

## 2019-01-30 NOTE — Telephone Encounter (Signed)
Patient returning call, unable to reach office. Patient is unsure of when she needs to return for labs.

## 2019-01-30 NOTE — Telephone Encounter (Signed)
Spoke with th pt and she is calling Dr. Larose Kells to make an appt... if she cannot see him.. then she will let us know and we can work her in with a PA for an ECG... pt to hopefully have ECG with Dr. Larose Kells at his office if she gets to see him this week.

## 2019-01-30 NOTE — Telephone Encounter (Signed)
Please to schedule an appointment at her earliest convenience. She needs to be seen within the next day or 2.  Overbook okay.

## 2019-01-30 NOTE — Telephone Encounter (Signed)
Needs lab appt scheduled to be completed in 6 months. Orders are in.

## 2019-01-30 NOTE — Telephone Encounter (Signed)
Pt called to report that she has been having some mild dizziness.. she says her HR yesterday was between 80-100 most of the day.. she denies palpitations, sob... chest pain.   She has been taking Prednisone for a week for a sinus infection but no decongestants.   I have asked her to talk with Dr. Larose Kells about her symptoms.. she has h/o low WBC and not on an antibiotic but still having sinus problems.   Pt to call him this morning and will call back if she has any other new or worsening symptoms.. will continue to monitor her BP and HR.   Will forward to Dr. Caryl Comes to see if he has any other recommendations.

## 2019-01-30 NOTE — Telephone Encounter (Signed)
New Message   STAT if patient feels like he/she is going to faint   1) Are you dizzy now? no  2) Do you feel faint or have you passed out? She said that she felt faint yesterday.  3) Do you have any other symptoms? Lightheaded   4) Have you checked your HR and BP (record if available)? HR continued to go between 80-101 all day

## 2019-01-30 NOTE — Telephone Encounter (Signed)
Noted  Worth getting an ECG when she follows up with Dr Larose Kells

## 2019-01-31 ENCOUNTER — Other Ambulatory Visit: Payer: Self-pay

## 2019-01-31 ENCOUNTER — Ambulatory Visit: Payer: BC Managed Care – PPO | Admitting: Internal Medicine

## 2019-01-31 ENCOUNTER — Encounter: Payer: Self-pay | Admitting: Internal Medicine

## 2019-01-31 VITALS — BP 133/83 | HR 64 | Temp 97.7°F | Resp 16 | Ht 69.0 in | Wt 126.1 lb

## 2019-01-31 DIAGNOSIS — J019 Acute sinusitis, unspecified: Secondary | ICD-10-CM | POA: Diagnosis not present

## 2019-01-31 DIAGNOSIS — R55 Syncope and collapse: Secondary | ICD-10-CM | POA: Diagnosis not present

## 2019-01-31 MED ORDER — AZELASTINE HCL 0.1 % NA SOLN: 2.0000 | Freq: Two times a day (BID) | NASAL | 6 refills | Status: DC

## 2019-01-31 MED ORDER — AMOXICILLIN 500 MG PO CAPS: 1000.0000 mg | ORAL_CAPSULE | Freq: Two times a day (BID) | ORAL | 0 refills | Status: DC

## 2019-01-31 NOTE — Progress Notes (Signed)
Subjective:    Patient ID: Brittany Hodges, female    DOB: 01-Dec-1965, 53 y.o.   MRN: [id number]  DOS:  01/31/2019 Type of visit - description: Acute visit 3 days ago, she stood up and she got extremely dizzy for 1 minute, "almost pass out". No associated symptoms, no further problems since then.  She however was really concerned and called cardiology, they suggested a EKG.  She typically has bradycardia and her heart rate is usually in the 50s.  With exertion it goes up to 109 and there is no associated dyspnea on exertion or chest pain.  Also concerned about her sinuses: She has sinus congestion for about 7 to 10 days, saw her ENT doctor, Rx prednisone. She is feeling no better, still have anterior bilateral sinus pain and some ear congestion.     Review of Systems  Denies fever chills Mild nasal congestion + Postnasal dripping No lower extremity edema Past Medical History:  Diagnosis Date  . Allergic rhinitis, cause unspecified   . Anemia   . Asthma   . Atrial septal defect    s/p repair;   Echo 08/07/10: EF 55-60%; mild MR; atrial septal aneurysm; no residual ASD  . Chest pain, unspecified    cardiac cath 07/31/10: normal coronaries; vigorous LVF  . Esophageal reflux   . Fibromyalgia   . Headache(784.0)   . Hematuria, unspecified   . Hypertension    no meds   . Hypothyroidism   . Irritable bowel syndrome   . Low back pain   . Rheumatoid arthritis(714.0)    Dr Herold Harms  . Scoliosis   . Sinoatrial node dysfunction (HCC)    eval. for chronotropic competence completed in past    Past Surgical History:  Procedure Laterality Date  . ASD REPAIR  1985  . TONSILLECTOMY AND ADENOIDECTOMY    . TUBAL LIGATION      Social History   Socioeconomic History  . Marital status: Single    Spouse name: Not on file  . Number of children: 3  . Years of education: Not on file  . Highest education level: Not on file  Occupational History  . Occupation: HR assistant    Social Needs  . Financial resource strain: Not on file  . Food insecurity    Worry: Not on file    Inability: Not on file  . Transportation needs    Medical: Not on file    Non-medical: Not on file  Tobacco Use  . Smoking status: Never Smoker  . Smokeless tobacco: Never Used  . Tobacco comment: never used tobacco  Substance and Sexual Activity  . Alcohol use: No  . Drug use: No  . Sexual activity: Not Currently  Lifestyle  . Physical activity    Days per week: Not on file    Minutes per session: Not on file  . Stress: Not on file  Relationships  . Social Herbalist on phone: Not on file    Gets together: Not on file    Attends religious service: Not on file    Active member of club or organization: Not on file    Attends meetings of clubs or organizations: Not on file    Relationship status: Not on file  . Intimate partner violence    Fear of current or ex partner: Not on file    Emotionally abused: Not on file    Physically abused: Not on file    Forced sexual activity:  Not on file  Other Topics Concern  . Not on file  Social History Narrative   Household-- pt , her mother and her youngest son       Allergies as of 01/31/2019      Reactions   Etanercept    Methotrexate Derivatives    Caused her WBC to drop   Pregabalin       Medication List       Accurate as of January 31, 2019 11:59 PM. If you have any questions, ask your nurse or doctor.        Adalimumab 40 MG/0.4ML Pnkt Commonly known as: Humira Pen Inject 40 mg into the skin every 14 (fourteen) days.   AeroChamber MV inhaler Use as instructed   albuterol 108 (90 Base) MCG/ACT inhaler Commonly known as: ProAir HFA Inhale 2 puffs into the lungs every 6 (six) hours as needed. For shortness of breath and wheezing   amLODipine 5 MG tablet Commonly known as: NORVASC Take 1 tablet (5 mg total) by mouth daily.   amoxicillin 500 MG capsule Commonly known as: AMOXIL Take 2 capsules  (1,000 mg total) by mouth 2 (two) times daily. Started by: Kathlene November, MD   azelastine 0.1 % nasal spray Commonly known as: ASTELIN Place 2 sprays into both nostrils 2 (two) times daily. What changed:   when to take this  reasons to take this  additional instructions Changed by: Kathlene November, MD   fluticasone 50 MCG/ACT nasal spray Commonly known as: FLONASE Place 2 sprays into both nostrils daily as needed.   fluticasone 50 MCG/BLIST diskus inhaler Commonly known as: FLOVENT DISKUS Inhale into the lungs as needed.   GINGER ROOT PO Take by mouth.   HOMATROPINE HBR OP Place 1 drop into the left eye every morning.   pantoprazole 40 MG tablet Commonly known as: PROTONIX Take 1 tablet (40 mg total) by mouth daily. Take before breakfast   prednisoLONE acetate 1 % ophthalmic suspension Commonly known as: PRED FORTE Place 1 drop into both eyes every other day.   predniSONE 5 MG tablet Commonly known as: DELTASONE Take as directed   TART CHERRY ADVANCED PO Take by mouth.   TURMERIC PO Take by mouth.   Vitamin D 50 MCG (2000 UT) Caps Take by mouth daily.           Objective:   Physical Exam BP 133/83 (BP Location: Left Arm, Patient Position: Sitting, Cuff Size: Small)   Pulse 64   Temp 97.7 F (36.5 C) (Temporal)   Resp 16   Ht 5\' 9"  (1.753 m)   Wt 126 lb 2 oz (57.2 kg)   SpO2 100%   BMI 18.63 kg/m  General:   Well developed, NAD, BMI noted. HEENT:  Normocephalic . Face symmetric, atraumatic TMs: Slightly bulged, worse on the right. Throat symmetric, no red. Nose is slightly congested, maxillary sinuses are slightly TTP Lungs:  CTA B Normal respiratory effort, no intercostal retractions, no accessory muscle use. Heart: RRR, + systolic murmur No pretibial edema bilaterally  Skin: Not pale. Not jaundice Neurologic:  alert & oriented X3.  Speech normal, gait appropriate for age and unassisted Psych--  Cognition and judgment appear intact.   Cooperative with normal attention span and concentration.  Behavior appropriate. No anxious or depressed appearing.      Assessment     Assessment   Prediabetes: A1c 6.0 (01-2017) HTN Hypothyroidism GERD- dysphagia -Dr. Norville Haggard EGD from 04/02/2014 (Dr Delrae Alfred): Narrowing of proximal esophagus, question of  extrinsic extrinsic compression, EUS showing no intrinsic esophageal mass but the spine was seen in close proximity to the esophagus probably resulting in extrinsic compression. The endoscopist recommend repeat CT neck and chest in 3 months.saw ENT 04-2015 for dysphagia,had a scope,  rx PPI Asthma, uses alb rarely  CV: --Atrial septal defect: Status post repair, no residual ASD --Sinoatrial  Dysfunction, sees Dr Caryl Comes   --CP: cath 2012, normal coronaries MSK: --Rheumatoid arthritis,   Dr. Herold Harms + rheumatoid factor +CCP,   h/o  low WBC with Methotrexate,   injection site reaction to Enbrel  , self d/c Humira ~ 12/2018 --Fibromyalgia IBS (Miralax prn constipation)   Admission: syncope 04-2016 (unlikely to be arrhythmia related per cards )  PLAN:  Dizziness, presyncope: Episode of dizziness 3 days ago, resolved, seem to be postural. EKG today: Sinus bradycardia.  There are T wave inversions on the lateral leads, different compared to previous EKG.  She had no symptoms other than a single episode of dizziness, currently asymptomatic.  Called cardiology, unable to get through the phone lines, will send a message. Addendum: EKG reviewed by cardiology, looks okay.  Appreciate their help.  See phone note. Sinusitis: Symptoms going on for several days, prednisone is not helping.  Recommend Flonase, Astelin.  If not better in few days to start amoxicillin.

## 2019-01-31 NOTE — Progress Notes (Signed)
Pre visit review using our clinic review tool, if applicable. No additional management support is needed unless otherwise documented below in the visit note. 

## 2019-01-31 NOTE — Telephone Encounter (Signed)
Needs appt please

## 2019-01-31 NOTE — Patient Instructions (Signed)
Call if you have any other symptoms  For your sinus infection congestion: Flonase OTC 2 sprays on each side of the nose daily Astelin, see prescription, 2 sprays in each side of the nose twice a day If you are not improving the next few days, start amoxicillin.

## 2019-02-01 ENCOUNTER — Other Ambulatory Visit: Payer: Self-pay | Admitting: Pharmacist

## 2019-02-01 ENCOUNTER — Telehealth: Payer: Self-pay | Admitting: Pharmacist

## 2019-02-01 DIAGNOSIS — Z79899 Other long term (current) drug therapy: Secondary | ICD-10-CM

## 2019-02-01 NOTE — Telephone Encounter (Signed)
Patient presents to office for labs.  Notified that she has stopped Humira due to recurrent respiratory infections.  She no showed follow up appointment on 01/25/2019. She states it was due to dizziness and sinus infections.    Advised patient to reschedule follow-up appointment to discuss other medication options.  She was scheduled for appointment by front staff on 02/21/2019.   Mariella Saa, PharmD, [place], [place] Clinical Specialty Pharmacist [phone]  02/01/2019 8:35 AM

## 2019-02-02 ENCOUNTER — Telehealth: Payer: Self-pay | Admitting: Cardiovascular Disease

## 2019-02-02 DIAGNOSIS — R42 Dizziness and giddiness: Secondary | ICD-10-CM

## 2019-02-02 LAB — COMPLETE METABOLIC PANEL WITH GFR
AG Ratio: 1.2 (calc) (ref 1.0–2.5)
ALT: 16 U/L (ref 6–29)
AST: 22 U/L (ref 10–35)
Albumin: 4.3 g/dL (ref 3.6–5.1)
Alkaline phosphatase (APISO): 60 U/L (ref 37–153)
BUN: 8 mg/dL (ref 7–25)
CO2: 25 mmol/L (ref 20–32)
Calcium: 10.7 mg/dL — ABNORMAL HIGH (ref 8.6–10.4)
Chloride: 109 mmol/L (ref 98–110)
Creat: 0.94 mg/dL (ref 0.50–1.05)
GFR, Est African American: 79 mL/min/{1.73_m2} (ref >=60)
GFR, Est Non African American: 68 mL/min/{1.73_m2} (ref >=60)
Globulin: 3.6 g/dL (calc) (ref 1.9–3.7)
Glucose, Bld: 91 mg/dL (ref 65–99)
Potassium: 4.9 mmol/L (ref 3.5–5.3)
Sodium: 145 mmol/L (ref 135–146)
Total Bilirubin: 0.4 mg/dL (ref 0.2–1.2)
Total Protein: 7.9 g/dL (ref 6.1–8.1)

## 2019-02-02 LAB — CBC WITH DIFFERENTIAL/PLATELET
Absolute Monocytes: 573 cells/uL (ref 200–950)
Basophils Absolute: 20 cells/uL (ref 0–200)
Basophils Relative: 0.4 %
Eosinophils Absolute: 49 cells/uL (ref 15–500)
Eosinophils Relative: 1 %
HCT: 41.7 % (ref 35.0–45.0)
Hemoglobin: 13 g/dL (ref 11.7–15.5)
Lymphs Abs: 1367 cells/uL (ref 850–3900)
MCH: 27.7 pg (ref 27.0–33.0)
MCHC: 31.2 g/dL — ABNORMAL LOW (ref 32.0–36.0)
MCV: 88.9 fL (ref 80.0–100.0)
MPV: 12.6 fL — ABNORMAL HIGH (ref 7.5–12.5)
Monocytes Relative: 11.7 %
Neutro Abs: 2891 cells/uL (ref 1500–7800)
Neutrophils Relative %: 59 %
Platelets: 175 10*3/uL (ref 140–400)
RBC: 4.69 10*6/uL (ref 3.80–5.10)
RDW: 12.2 % (ref 11.0–15.0)
Total Lymphocyte: 27.9 %
WBC: 4.9 10*3/uL (ref 3.8–10.8)

## 2019-02-02 NOTE — Telephone Encounter (Signed)
Noted, thank you

## 2019-02-02 NOTE — Telephone Encounter (Signed)
Complained of one episode of dizziness as Dr Larose Kells and Caryl Comes know ECG is fine Would consider monitor if she has another episode since she had ASD repair with junctional rhythmi in past She was supposed to have ETT in June that still has not been done Asked my nurse to order  And get f/u with Dr Caryl Comes ETT will help with documenting chronotropic competence

## 2019-02-02 NOTE — Progress Notes (Signed)
Calcium is elevated-10.7.  please advise patient to avoid taking a calcium supplement.  Rest of CMP WNL.   CBC stable. WBC count is WNL.

## 2019-02-02 NOTE — Telephone Encounter (Signed)
Called patient about Dr. Kyla Balzarine recommendations. Ordered GXT for patient to have done at [facility] office. Will send message to Scheduling to get appointment scheduled with Dr. Caryl Comes after test is complete. Advised patient to call our office if she has any more episode, that a monitor might be ordered if she has any more. Patient agreed to plan and stated she felt fine right now. Will forward to Dr. Larose Kells, so he is aware.

## 2019-02-02 NOTE — Assessment & Plan Note (Addendum)
Dizziness, presyncope: Episode of dizziness 3 days ago, resolved, seem to be postural. EKG today: Sinus bradycardia.  There are T wave inversions on the lateral leads, different compared to previous EKG.  She had no symptoms other than a single episode of dizziness, currently asymptomatic.  Called cardiology, unable to get through the phone lines, will send a message. Addendum: EKG reviewed by cardiology, looks okay.  Appreciate their help.  See phone note. Sinusitis: Symptoms going on for several days, prednisone is not helping.  Recommend Flonase, Astelin.  If not better in few days to start amoxicillin.

## 2019-02-02 NOTE — Telephone Encounter (Signed)
New Message:   Please call Dr Larose Kells. He would like to talk to the doctor of the day. It is concerning, Brittany Hodges.

## 2019-02-07 NOTE — Progress Notes (Deleted)
Office Visit Note  Patient: Brittany Hodges             Date of Birth: October 19, 1965           MRN: [id number]             PCP: Colon Branch, MD Referring: Colon Branch, MD Visit Date: 02/21/2019 Occupation: @GUAROCC @  Subjective:  No chief complaint on file.  She stopped Humira due to frequent respiratory infection.  History of Present Illness: DULA KERESTES is a 53 y.o. female ***   Activities of Daily Living:  Patient reports morning stiffness for *** {minute/hour:19697}.   Patient {ACTIONS;DENIES/REPORTS:21021675::"Denies"} nocturnal pain.  Difficulty dressing/grooming: {ACTIONS;DENIES/REPORTS:21021675::"Denies"} Difficulty climbing stairs: {ACTIONS;DENIES/REPORTS:21021675::"Denies"} Difficulty getting out of chair: {ACTIONS;DENIES/REPORTS:21021675::"Denies"} Difficulty using hands for taps, buttons, cutlery, and/or writing: {ACTIONS;DENIES/REPORTS:21021675::"Denies"}  No Rheumatology ROS completed.   PMFS History:  Patient Active Problem List   Diagnosis Date Noted  . Asthma 04/06/2016  . Laryngopharyngeal reflux (LPR) 04/06/2016  . PCP NOTES >>>>> 03/09/2015  . Annual physical exam 12/21/2014  . Cardiomegaly, by MRI 03/16/2014  . Leukopenia 03/10/2014  . Lightheadedness 02/10/2011  . SINOATRIAL NODE DYSFUNCTION 08/06/2008  . ATRIAL SEPTAL DEFECT 06/29/2007  . Hypothyroidism 03/21/2007  . Essential hypertension 03/21/2007  . Allergic rhinitis 03/21/2007  . GERD-- dysphagia 03/21/2007  . CONSTIPATION 03/21/2007  . IRRITABLE BOWEL SYNDROME 03/21/2007  . RA (rheumatoid arthritis) ([place]) 03/21/2007  . LOW BACK PAIN 03/21/2007  . FIBROMYALGIA 03/21/2007    Past Medical History:  Diagnosis Date  . Allergic rhinitis, cause unspecified   . Anemia   . Asthma   . Atrial septal defect    s/p repair;   Echo 08/07/10: EF 55-60%; mild MR; atrial septal aneurysm; no residual ASD  . Chest pain, unspecified    cardiac cath 07/31/10: normal coronaries; vigorous LVF  .  Esophageal reflux   . Fibromyalgia   . Headache(784.0)   . Hematuria, unspecified   . Hypertension    no meds   . Hypothyroidism   . Irritable bowel syndrome   . Low back pain   . Rheumatoid arthritis(714.0)    Dr Herold Harms  . Scoliosis   . Sinoatrial node dysfunction (HCC)    eval. for chronotropic competence completed in past    Family History  Problem Relation Age of Onset  . Hypertension Mother   . Diabetes Mother   . Heart disease Maternal Grandmother   . Stomach cancer Maternal Grandmother 95  . Glaucoma Father   . Breast cancer Other        aunt  . Diabetes Other        GM  . Hypertension Sister   . Thyroid disease Sister   . Hypertension Sister   . Thyroid disease Sister   . Colon cancer Neg Hx    Past Surgical History:  Procedure Laterality Date  . ASD REPAIR  1985  . TONSILLECTOMY AND ADENOIDECTOMY    . TUBAL LIGATION     Social History   Social History Narrative   Household-- pt , her mother and her youngest son    Immunization History  Administered Date(s) Administered  . Influenza Split 03/17/2011, 03/17/2012  . Influenza Whole 04/05/2009, 02/21/2010  . Influenza,inj,Quad PF,6+ Mos 03/21/2013, 03/09/2014, 05/24/2015, 01/27/2016  . Pneumococcal Conjugate-13 01/27/2016  . Pneumococcal Polysaccharide-23 03/08/2015  . Td 07/23/2010     Objective: Vital Signs: There were no vitals taken for this visit.   Physical Exam   Musculoskeletal Exam: ***  CDAI  Exam: CDAI Score: - Patient Global: -; Provider Global: - Swollen: -; Tender: - Joint Exam   No joint exam has been documented for this visit   There is currently no information documented on the homunculus. Go to the Rheumatology activity and complete the homunculus joint exam.  Investigation: No additional findings.  Imaging: No results found.  Recent Labs: Lab Results  Component Value Date   WBC 4.9 02/01/2019   HGB 13.0 02/01/2019   PLT 175 02/01/2019   NA 145 02/01/2019   K  4.9 02/01/2019   CL 109 02/01/2019   CO2 25 02/01/2019   GLUCOSE 91 02/01/2019   BUN 8 02/01/2019   CREATININE 0.94 02/01/2019   BILITOT 0.4 02/01/2019   ALKPHOS 60 11/30/2017   AST 22 02/01/2019   ALT 16 02/01/2019   PROT 7.9 02/01/2019   ALBUMIN 4.1 11/30/2017   CALCIUM 10.7 (H) 02/01/2019   GFRAA 79 02/01/2019   QFTBGOLDPLUS NEGATIVE 06/23/2018    Speciality Comments: Dexa- 05/27/18 Osteopenia T-Score -1.7 BMD 0.963  Prior therapy includes: Plaquenil (inadequate response) , Enbrel (injection site reaction) and methotrexate (neutropenia so on low dose).  Procedures:  No procedures performed Allergies: Etanercept, Methotrexate derivatives, and Pregabalin   Assessment / Plan:     Visit Diagnoses: No diagnosis found.  Orders: No orders of the defined types were placed in this encounter.  No orders of the defined types were placed in this encounter.   Face-to-face time spent with patient was *** minutes. Greater than 50% of time was spent in counseling and coordination of care.  Follow-Up Instructions: No follow-ups on file.   Earnestine Mealing, CMA  Note - This record has been created using Editor, commissioning.  Chart creation errors have been sought, but may not always  have been located. Such creation errors do not reflect on  the standard of medical care.

## 2019-02-10 ENCOUNTER — Telehealth (HOSPITAL_COMMUNITY): Payer: Self-pay

## 2019-02-10 NOTE — Telephone Encounter (Signed)
Encounter complete. 

## 2019-02-11 ENCOUNTER — Other Ambulatory Visit (HOSPITAL_COMMUNITY)
Admission: RE | Admit: 2019-02-11 | Discharge: 2019-02-11 | Disposition: A | Discharge status: Home / Self Care | Payer: BC Managed Care – PPO | Source: Ambulatory Visit | Attending: Internal Medicine | Admitting: Internal Medicine

## 2019-02-11 DIAGNOSIS — Z20828 Contact with and (suspected) exposure to other viral communicable diseases: Secondary | ICD-10-CM | POA: Diagnosis not present

## 2019-02-11 DIAGNOSIS — Z01812 Encounter for preprocedural laboratory examination: Secondary | ICD-10-CM | POA: Diagnosis present

## 2019-02-12 LAB — NOVEL CORONAVIRUS, NAA (HOSP ORDER, SEND-OUT TO REF LAB; TAT 18-24 HRS): SARS-CoV-2, NAA: NOT DETECTED

## 2019-02-15 ENCOUNTER — Other Ambulatory Visit: Payer: Self-pay

## 2019-02-15 ENCOUNTER — Encounter (HOSPITAL_COMMUNITY): Payer: BC Managed Care – PPO

## 2019-02-15 ENCOUNTER — Ambulatory Visit (HOSPITAL_COMMUNITY)
Admission: RE | Admit: 2019-02-15 | Discharge: 2019-02-15 | Disposition: A | Discharge status: Home / Self Care | Payer: BC Managed Care – PPO | Source: Ambulatory Visit | Attending: Cardiovascular Disease | Admitting: Cardiovascular Disease

## 2019-02-15 DIAGNOSIS — R42 Dizziness and giddiness: Secondary | ICD-10-CM | POA: Diagnosis present

## 2019-02-15 LAB — EXERCISE TOLERANCE TEST
Estimated workload: 13.4 METS
Exercise duration (min): 11 min
Exercise duration (sec): 1 s
MPHR: 165 {beats}/min
Peak HR: 155 {beats}/min
Percent HR: 93 %
Rest HR: 47 {beats}/min

## 2019-02-21 ENCOUNTER — Ambulatory Visit: Payer: BC Managed Care – PPO | Admitting: Physician Assistant

## 2019-03-01 ENCOUNTER — Other Ambulatory Visit: Payer: Self-pay

## 2019-03-01 ENCOUNTER — Encounter: Payer: Self-pay | Admitting: Internal Medicine

## 2019-03-01 ENCOUNTER — Ambulatory Visit: Payer: BC Managed Care – PPO | Admitting: Internal Medicine

## 2019-03-01 VITALS — BP 122/76 | HR 49 | Ht 69.0 in | Wt 131.4 lb

## 2019-03-01 DIAGNOSIS — Q211 Atrial septal defect: Secondary | ICD-10-CM

## 2019-03-01 DIAGNOSIS — Q2111 Secundum atrial septal defect: Secondary | ICD-10-CM

## 2019-03-01 DIAGNOSIS — I10 Essential (primary) hypertension: Secondary | ICD-10-CM | POA: Diagnosis not present

## 2019-03-01 DIAGNOSIS — R001 Bradycardia, unspecified: Secondary | ICD-10-CM | POA: Diagnosis not present

## 2019-03-01 NOTE — Progress Notes (Signed)
Patient Care Team: Colon Branch, MD as PCP - General (Internal Medicine) Deboraha Sprang, MD as Consulting Physician (Cardiology) Bo Merino, MD as Consulting Physician (Rheumatology) Senaida Ores, MD as Referring Physician (Obstetrics and Gynecology)   HPI  Brittany Hodges is a 53 y.o. female is seen in followup for junctional rhythm in the setting of atrial septal defect repair. In the past she has had assessments of chrontropic competence which have been adequate.  She does have mild limitations but has problems with exercise.   Hospitalized in the past for syncope.  Brought by [facility] EP and by me subsequently to probably be neurally mediated.   GXT 9/20 Max HR 155   BP controlled Taking care of her mom, doing nightly PD   exhausting  Date Cr K  11/18/ 0.79 3.9  /         Past Medical History:  Diagnosis Date  . Allergic rhinitis, cause unspecified   . Anemia   . Asthma   . Atrial septal defect    s/p repair;   Echo 08/07/10: EF 55-60%; mild MR; atrial septal aneurysm; no residual ASD  . Chest pain, unspecified    cardiac cath 07/31/10: normal coronaries; vigorous LVF  . Esophageal reflux   . Fibromyalgia   . Headache(784.0)   . Hematuria, unspecified   . Hypertension    no meds   . Hypothyroidism   . Irritable bowel syndrome   . Low back pain   . Rheumatoid arthritis(714.0)    Dr Herold Harms  . Scoliosis   . Sinoatrial node dysfunction (HCC)    eval. for chronotropic competence completed in past    Past Surgical History:  Procedure Laterality Date  . ASD REPAIR  1985  . TONSILLECTOMY AND ADENOIDECTOMY    . TUBAL LIGATION      Current Outpatient Medications  Medication Sig Dispense Refill  . albuterol (PROAIR HFA) 108 (90 Base) MCG/ACT inhaler Inhale 2 puffs into the lungs every 6 (six) hours as needed. For shortness of breath and wheezing 1 Inhaler 6  . amLODipine (NORVASC) 5 MG tablet Take 1 tablet (5 mg total) by mouth daily. 90  tablet 3  . amoxicillin (AMOXIL) 500 MG capsule Take 2 capsules (1,000 mg total) by mouth 2 (two) times daily. 28 capsule 0  . azelastine (ASTELIN) 0.1 % nasal spray Place 2 sprays into both nostrils 2 (two) times daily. 30 mL 6  . Cholecalciferol (VITAMIN D) 50 MCG (2000 UT) CAPS Take by mouth daily.    . fluticasone (FLONASE) 50 MCG/ACT nasal spray Place 2 sprays into both nostrils daily as needed.    . fluticasone (FLOVENT DISKUS) 50 MCG/BLIST diskus inhaler Inhale into the lungs as needed.     . Ginger, Zingiber officinalis, (GINGER ROOT PO) Take by mouth.    Marland Kitchen HOMATROPINE HBR OP Place 1 drop into the left eye every morning.     . Misc Natural Products (TART CHERRY ADVANCED PO) Take by mouth.    . pantoprazole (PROTONIX) 40 MG tablet Take 1 tablet (40 mg total) by mouth daily. Take before breakfast 30 tablet 3  . prednisoLONE acetate (PRED FORTE) 1 % ophthalmic suspension Place 1 drop into both eyes every other day.     . predniSONE (DELTASONE) 5 MG tablet Take as directed    . Spacer/Aero-Holding Chambers (AEROCHAMBER MV) inhaler Use as instructed 1 each 0  . TURMERIC PO Take by mouth.     No  current facility-administered medications for this visit.     Allergies  Allergen Reactions  . Etanercept   . Methotrexate Derivatives     Caused her WBC to drop  . Pregabalin     Review of Systems negative except from HPI and PMH  Physical Exam BP 122/76   Pulse (!) 49   Ht 5\' 9"  (1.753 m)   Wt 131 lb 6.4 oz (59.6 kg)   SpO2 99%   BMI 19.40 kg/m    Well developed and nourished in no acute distress HENT normal Neck supple with JVP-  flat   Clear Regular rate and rhythm, no murmurs or gallops Abd-soft with active BS No Clubbing cyanosis edema Skin-warm and dry A & Oriented  Grossly normal sensory and motor function  ECG  01/29/19 Personally reviewed  Sinus @ 52 14/09/47 St-t possible lateral ischemia Also present inferolaterally on GXT tracings  ECGs from 2015--2020  reviewed and ST changes are new    Assessment and  Plan  Sinus node dysfunction  Syncope  Atrial septal defect repair   Hypertension  Abnormal ECG  No chronotropic incompetence  Most recent episode of lightheadedness occurred after prolonged squatting.  Likely orthostatic.  Original episode in the kitchen is much less clear.  In the event that she would have recurrent syncope would recommend implantation of a loop recorder.  Showed it to her today.  ECG is newly abnormal.  Follow this along; has undergone interval POET which was unrevealing for ischemia.  We spent more than 50% of our >25 min visit in face to face counseling regarding the above

## 2019-03-01 NOTE — Patient Instructions (Signed)
Medication Instructions:  Your physician recommends that you continue on your current medications as directed. Please refer to the Current Medication list given to you today.  Labwork: None ordered.  Testing/Procedures: None ordered.  Follow-Up: Your physician wants you to follow-up in: one year with Dr. Caryl Comes.   You will receive a reminder letter in the mail two months in advance. If you don't receive a letter, please call our office to schedule the follow-up appointment.  Any Other Special Instructions Will Be Listed Below (If Applicable).  If you need a refill on your cardiac medications before your next appointment, please call your pharmacy.

## 2019-03-15 ENCOUNTER — Telehealth: Payer: Self-pay | Admitting: Internal Medicine

## 2019-03-15 NOTE — Telephone Encounter (Signed)
Called pt to schedule virtual and she said that the zpack cleared up her symptoms before. She picked up the amoxicillin but never took it. She is asking if it is ok for her to take now. Primary she has sinus pressure, pressure in her teeth, and sore throat.

## 2019-03-15 NOTE — Telephone Encounter (Signed)
Please advise. Or should she schedule another virtual visit if symptoms have come back?

## 2019-03-15 NOTE — Telephone Encounter (Signed)
Patient has been prescribed amoxicillin (AMOXIL) 500 MG capsule   . Shes been having tooth, Throat and face pain from sinus infection on the right side . She wanting to know should she come in to be checked or sa taking the prescribed mediation first.    Please advise  Call back number  [phone] office number pt will be available until 3pm after that please call cell phone number  [phone] cell

## 2019-03-15 NOTE — Telephone Encounter (Signed)
Please schedule virtual visit. Has been over 1 month since last visit.

## 2019-03-15 NOTE — Telephone Encounter (Signed)
On my last note I recommended to start amoxicillin if she is no better. Advised patient: If symptoms severe needs to be seen. His symptoms are not severe, okay to proceed with amoxicillin but also continue taking Flonase and Astelin. Office visit if not improving after amoxicillin

## 2019-03-15 NOTE — Telephone Encounter (Signed)
Spoke w/ Pt- informed of PCP recommendations. Pt verbalized understanding.  

## 2019-03-22 ENCOUNTER — Ambulatory Visit: Payer: Self-pay | Admitting: *Deleted

## 2019-03-22 NOTE — Telephone Encounter (Signed)
Appt scheduled

## 2019-03-22 NOTE — Telephone Encounter (Signed)
Contacted pt due to her concerns of ongoing sinus pain that has worsened; the pt says that she started Amoxicillin 500 mg (2capsules) PO bid on 03/15/2019; her pain has worsened, and even though she is using nasal spray she still has drainage into her stomach; recommendations made per nurse triage protocol; she verbalized understanding and would also like a refill for Flonase; the pt sees Dr Larose Kells, [facility]; pt transferred to [facility] for scheduling. Reason for Disposition . [1] Taking antibiotic > 72 hours (3 days) AND [2] sinus pain not improved  Answer Assessment - Initial Assessment Questions 1. ANTIBIOTIC: "What antibiotic are you receiving?" "How many times per day?"     Amoxicillin 500 mg (2 capsules) PO bid  2. ONSET: "When was the antibiotic started?"   03/15/2019 3. PAIN: "How bad is the sinus pain?"   (Scale 1-10; mild, moderate or severe)   - MILD (1-3): doesn't interfere with normal activities    - MODERATE (4-7): interferes with normal activities (e.g., work or school) or awakens from sleep   - SEVERE (8-10): excruciating pain and patient unable to do any normal activities        9-10 out of 10; 5-6 out of 10 now 4. FEVER: "Do you have a fever?" If so, ask: "What is it, how was it measured, and when did it start?"      no 5. SYMPTOMS: "Are there any other symptoms you're concerned about?" If so, ask: "When did it start?"   Ongoing pain which is worsening in her; Jaw, upper teeth, bridge of nose and eye pain; pain behind ear lobes R>L 6. PREGNANCY: "Is there any chance you are pregnant?" "When was your last menstrual period?"     No menopausal  Protocols used: SINUS INFECTION ON ANTIBIOTIC FOLLOW-UP CALL-A-AH

## 2019-03-23 ENCOUNTER — Ambulatory Visit (INDEPENDENT_AMBULATORY_CARE_PROVIDER_SITE_OTHER): Payer: BC Managed Care – PPO | Admitting: Internal Medicine

## 2019-03-23 ENCOUNTER — Encounter: Payer: Self-pay | Admitting: Internal Medicine

## 2019-03-23 ENCOUNTER — Other Ambulatory Visit: Payer: Self-pay

## 2019-03-23 DIAGNOSIS — J309 Allergic rhinitis, unspecified: Secondary | ICD-10-CM

## 2019-03-23 MED ORDER — FLUTICASONE PROPIONATE 50 MCG/ACT NA SUSP: 2.0000 | Freq: Every day | NASAL | 6 refills | Status: DC | PRN

## 2019-03-23 NOTE — Progress Notes (Signed)
Subjective:    Patient ID: KENNIDI REYNOLD, female    DOB: Oct 19, 1965, 53 y.o.   MRN: [id number]  DOS:  03/23/2019 Type of visit - description: Attempted  to make this a video visit, due to technical difficulties from the patient side it was not possible  thus we proceeded with a Virtual Visit via Telephone    I connected with@   by telephone and verified that I am speaking with the correct person using two identifiers.  THIS ENCOUNTER IS A VIRTUAL VISIT DUE TO COVID-19 - PATIENT WAS NOT SEEN IN THE OFFICE. PATIENT HAS CONSENTED TO VIRTUAL VISIT / TELEMEDICINE VISIT   Location of patient: home (patient was actually driving, I asked her to park to proceed with the phone call, she declined)  Location of provider: office  I discussed the limitations, risks, security and privacy concerns of performing an evaluation and management service by telephone and the availability of in person appointments. I also discussed with the patient that there may be a patient responsible charge related to this service. The patient expressed understanding and agreed to proceed.   History of Present Illness: Acute visit Since the last office visit, her sinus symptoms continue. She is taking Flonase, Astelin. She started amoxicillin few days ago. Patient likes to know the next steps to make her better    Review of Systems  No cough No diarrhea No difficulty breathing no chest pain No problems with the smell or taste No myalgias Past Medical History:  Diagnosis Date  . Allergic rhinitis, cause unspecified   . Anemia   . Asthma   . Atrial septal defect    s/p repair;   Echo 08/07/10: EF 55-60%; mild MR; atrial septal aneurysm; no residual ASD  . Chest pain, unspecified    cardiac cath 07/31/10: normal coronaries; vigorous LVF  . Esophageal reflux   . Fibromyalgia   . Headache(784.0)   . Hematuria, unspecified   . Hypertension    no meds   . Hypothyroidism   . Irritable bowel syndrome   .  Low back pain   . Rheumatoid arthritis(714.0)    Dr Herold Harms  . Scoliosis   . Sinoatrial node dysfunction (HCC)    eval. for chronotropic competence completed in past    Past Surgical History:  Procedure Laterality Date  . ASD REPAIR  1985  . TONSILLECTOMY AND ADENOIDECTOMY    . TUBAL LIGATION      Social History   Socioeconomic History  . Marital status: Single    Spouse name: Not on file  . Number of children: 3  . Years of education: Not on file  . Highest education level: Not on file  Occupational History  . Occupation: HR assistant   Social Needs  . Financial resource strain: Not on file  . Food insecurity    Worry: Not on file    Inability: Not on file  . Transportation needs    Medical: Not on file    Non-medical: Not on file  Tobacco Use  . Smoking status: Never Smoker  . Smokeless tobacco: Never Used  . Tobacco comment: never used tobacco  Substance and Sexual Activity  . Alcohol use: No  . Drug use: No  . Sexual activity: Not Currently  Lifestyle  . Physical activity    Days per week: Not on file    Minutes per session: Not on file  . Stress: Not on file  Relationships  . Social connections  Talks on phone: Not on file    Gets together: Not on file    Attends religious service: Not on file    Active member of club or organization: Not on file    Attends meetings of clubs or organizations: Not on file    Relationship status: Not on file  . Intimate partner violence    Fear of current or ex partner: Not on file    Emotionally abused: Not on file    Physically abused: Not on file    Forced sexual activity: Not on file  Other Topics Concern  . Not on file  Social History Narrative   Household-- pt , her mother and her youngest son       Allergies as of 03/23/2019      Reactions   Etanercept    Methotrexate Derivatives    Caused her WBC to drop   Pregabalin       Medication List       Accurate as of March 23, 2019 11:59 PM. If you  have any questions, ask your nurse or doctor.        STOP taking these medications   amoxicillin 500 MG capsule Commonly known as: AMOXIL Stopped by: Kathlene November, MD   predniSONE 5 MG tablet Commonly known as: DELTASONE Stopped by: Kathlene November, MD     TAKE these medications   AeroChamber MV inhaler Use as instructed   albuterol 108 (90 Base) MCG/ACT inhaler Commonly known as: ProAir HFA Inhale 2 puffs into the lungs every 6 (six) hours as needed. For shortness of breath and wheezing   amLODipine 5 MG tablet Commonly known as: NORVASC Take 1 tablet (5 mg total) by mouth daily.   azelastine 0.1 % nasal spray Commonly known as: ASTELIN Place 2 sprays into both nostrils 2 (two) times daily.   fluticasone 50 MCG/ACT nasal spray Commonly known as: FLONASE Place 2 sprays into both nostrils daily as needed.   fluticasone 50 MCG/BLIST diskus inhaler Commonly known as: FLOVENT DISKUS Inhale into the lungs as needed.   GINGER ROOT PO Take by mouth.   HOMATROPINE HBR OP Place 1 drop into the left eye every morning.   pantoprazole 40 MG tablet Commonly known as: PROTONIX Take 1 tablet (40 mg total) by mouth daily. Take before breakfast   prednisoLONE acetate 1 % ophthalmic suspension Commonly known as: PRED FORTE Place 1 drop into both eyes every other day.   TART CHERRY ADVANCED PO Take by mouth.   TURMERIC PO Take by mouth.   Vitamin D 50 MCG (2000 UT) Caps Take by mouth daily.           Objective:   Physical Exam There were no vitals taken for this visit. This is a Designer, multimedia visit,, she is alert oriented x3, in no distress, speaking in complete sentences    Assessment     Assessment   Prediabetes: A1c 6.0 (01-2017) HTN Hypothyroidism GERD- dysphagia -Dr. Norville Haggard EGD from 04/02/2014 (Dr Delrae Alfred): Narrowing of proximal esophagus, question of extrinsic extrinsic compression, EUS showing no intrinsic esophageal mass but the spine was seen in close  proximity to the esophagus probably resulting in extrinsic compression. The endoscopist recommend repeat CT neck and chest in 3 months.saw ENT 04-2015 for dysphagia,had a scope,  rx PPI Asthma, uses alb rarely  CV: --Atrial septal defect: Status post repair, no residual ASD --Sinoatrial  Dysfunction, sees Dr Caryl Comes   --CP: cath 2012, normal coronaries MSK: --Rheumatoid arthritis,  Dr. Herold Harms + rheumatoid factor +CCP,   h/o  low WBC with Methotrexate,   injection site reaction to Enbrel  , self d/c Humira ~ 12/2018 --Fibromyalgia IBS (Miralax prn constipation)   Admission: syncope 04-2016 (unlikely to be arrhythmia related per cards )  PLAN:  Sinusitis (allergic versus bacterial):  Ongoing symptoms. We thought about COVID-19 as she is high risk for complications however at this point nothing point out to a viral syndrome .  Had several previous (-)  coronavirus tests Plan: Continue Flonase, Astelin.  Add Claritin. Watch for red flag symptoms Call if no better in few days, allergist referral?

## 2019-03-24 NOTE — Assessment & Plan Note (Signed)
Sinusitis (allergic versus bacterial):  Ongoing symptoms. We thought about COVID-19 as she is high risk for complications however at this point nothing point out to a viral syndrome .  Had several previous (-)  coronavirus tests Plan: Continue Flonase, Astelin.  Add Claritin. Watch for red flag symptoms Call if no better in few days, allergist referral?

## 2019-04-24 ENCOUNTER — Telehealth: Payer: Self-pay

## 2019-04-24 NOTE — Telephone Encounter (Signed)
Immunization record faxed to number provided.

## 2019-04-24 NOTE — Telephone Encounter (Signed)
Copied from [place] [id number]. Topic: General - Other >> Apr 24, 2019 12:06 PM Burchel, Abbi R wrote: Reason for CRM: Please fax a copy of pt's immunization record to her employer:  [phone]

## 2019-04-26 ENCOUNTER — Other Ambulatory Visit: Payer: Self-pay

## 2019-04-26 DIAGNOSIS — Z20822 Contact with and (suspected) exposure to covid-19: Secondary | ICD-10-CM

## 2019-04-28 LAB — NOVEL CORONAVIRUS, NAA: SARS-CoV-2, NAA: NOT DETECTED

## 2019-05-09 ENCOUNTER — Encounter: Payer: Self-pay | Admitting: Radiology

## 2019-05-09 NOTE — Telephone Encounter (Signed)
error 

## 2019-06-05 ENCOUNTER — Telehealth: Payer: Self-pay | Admitting: Rheumatology

## 2019-06-05 NOTE — Telephone Encounter (Signed)
Patient is ready to discuss new medication, or try Humira again. Patient's last visit 11-2018. Please call to advise appointment with Luetta Nutting, or doctor? In person appointment, or virtual?

## 2019-06-06 ENCOUNTER — Ambulatory Visit: Payer: BC Managed Care – PPO | Admitting: Rheumatology

## 2019-06-06 ENCOUNTER — Encounter: Payer: Self-pay | Admitting: Rheumatology

## 2019-06-06 ENCOUNTER — Other Ambulatory Visit: Payer: Self-pay

## 2019-06-06 VITALS — BP 146/82 | HR 50 | Resp 12 | Ht 69.0 in | Wt 135.0 lb

## 2019-06-06 DIAGNOSIS — Z79899 Other long term (current) drug therapy: Secondary | ICD-10-CM

## 2019-06-06 DIAGNOSIS — Z8669 Personal history of other diseases of the nervous system and sense organs: Secondary | ICD-10-CM

## 2019-06-06 DIAGNOSIS — M546 Pain in thoracic spine: Secondary | ICD-10-CM

## 2019-06-06 DIAGNOSIS — M19041 Primary osteoarthritis, right hand: Secondary | ICD-10-CM | POA: Diagnosis not present

## 2019-06-06 DIAGNOSIS — Z87898 Personal history of other specified conditions: Secondary | ICD-10-CM

## 2019-06-06 DIAGNOSIS — M0579 Rheumatoid arthritis with rheumatoid factor of multiple sites without organ or systems involvement: Secondary | ICD-10-CM | POA: Diagnosis not present

## 2019-06-06 DIAGNOSIS — H209 Unspecified iridocyclitis: Secondary | ICD-10-CM

## 2019-06-06 DIAGNOSIS — Z8639 Personal history of other endocrine, nutritional and metabolic disease: Secondary | ICD-10-CM

## 2019-06-06 DIAGNOSIS — M19071 Primary osteoarthritis, right ankle and foot: Secondary | ICD-10-CM

## 2019-06-06 DIAGNOSIS — M5030 Other cervical disc degeneration, unspecified cervical region: Secondary | ICD-10-CM

## 2019-06-06 DIAGNOSIS — Z8719 Personal history of other diseases of the digestive system: Secondary | ICD-10-CM

## 2019-06-06 DIAGNOSIS — M19072 Primary osteoarthritis, left ankle and foot: Secondary | ICD-10-CM

## 2019-06-06 DIAGNOSIS — Z8679 Personal history of other diseases of the circulatory system: Secondary | ICD-10-CM

## 2019-06-06 DIAGNOSIS — Z8739 Personal history of other diseases of the musculoskeletal system and connective tissue: Secondary | ICD-10-CM

## 2019-06-06 DIAGNOSIS — M19042 Primary osteoarthritis, left hand: Secondary | ICD-10-CM

## 2019-06-06 DIAGNOSIS — D708 Other neutropenia: Secondary | ICD-10-CM

## 2019-06-06 NOTE — Progress Notes (Signed)
Office Visit Note  Patient: Brittany Hodges             Date of Birth: May 07, 1966           MRN: [id number]             PCP: Colon Branch, MD Referring: Colon Branch, MD Visit Date: 06/06/2019 Occupation: @GUAROCC @  Subjective:  Discussed medication options.  History of Present Illness: Brittany Hodges is a 54 y.o. female with history of seropositive rheumatoid arthritis, iritis, and osteoarthritis.  She discontinued methotrexate in January 2020 due to neutropenia.  She was started on Humira on 10/19/2018 due to experiencing recurrent iritis flares.  She only took 1 dose of Humira before discontinuing due to a sinus infection.  She states that she has been referred to an allergist for further evaluation.  She states that she continues to have iritis flares every other week.  She states occasionally is in one eye and sometimes is in both eyes.  She states that both eyes are inflamed currently.  She states she has been using prednisone drops prescribed by her ophthalmologist but they do not seem to be helping.  She continues to see ophthalmologist every 3 months.  Her last appointment is 1 month ago and according to the patient she was advised to follow-up with [place] to discuss restarting on Humira. She denies any joint pain or joint swelling.  She denies any morning stiffness.   Activities of Daily Living:  Patient reports morning stiffness for 0 minutes.   Patient Denies nocturnal pain.  Difficulty dressing/grooming: Denies Difficulty climbing stairs: Denies Difficulty getting out of chair: Denies Difficulty using hands for taps, buttons, cutlery, and/or writing: Denies  Review of Systems  Constitutional: Negative for fatigue.  HENT: Positive for nose dryness. Negative for mouth sores and mouth dryness.   Eyes: Positive for pain, redness and dryness. Negative for visual disturbance.  Respiratory: Negative for cough, hemoptysis, shortness of breath, wheezing and difficulty breathing.     Cardiovascular: Negative for chest pain, palpitations, hypertension and swelling in legs/feet.  Gastrointestinal: Negative for blood in stool, constipation and diarrhea.  Endocrine: Negative for increased urination.  Genitourinary: Negative for difficulty urinating and painful urination.  Musculoskeletal: Negative for arthralgias, joint pain, joint swelling, myalgias, muscle weakness, morning stiffness, muscle tenderness and myalgias.  Skin: Negative for color change, pallor, rash, hair loss, nodules/bumps, skin tightness, ulcers and sensitivity to sunlight.  Allergic/Immunologic: Negative for susceptible to infections.  Neurological: Negative for dizziness, light-headedness, numbness, headaches, memory loss and weakness.  Hematological: Negative for bruising/bleeding tendency and swollen glands.  Psychiatric/Behavioral: Negative for depressed mood, confusion and sleep disturbance. The patient is not nervous/anxious.     PMFS History:  Patient Active Problem List   Diagnosis Date Noted  . Asthma 04/06/2016  . Laryngopharyngeal reflux (LPR) 04/06/2016  . PCP NOTES >>>>> 03/09/2015  . Annual physical exam 12/21/2014  . Cardiomegaly, by MRI 03/16/2014  . Leukopenia 03/10/2014  . Lightheadedness 02/10/2011  . SINOATRIAL NODE DYSFUNCTION 08/06/2008  . ATRIAL SEPTAL DEFECT 06/29/2007  . Hypothyroidism 03/21/2007  . Essential hypertension 03/21/2007  . Allergic rhinitis 03/21/2007  . GERD-- dysphagia 03/21/2007  . CONSTIPATION 03/21/2007  . IRRITABLE BOWEL SYNDROME 03/21/2007  . RA (rheumatoid arthritis) ([place]) 03/21/2007  . LOW BACK PAIN 03/21/2007  . FIBROMYALGIA 03/21/2007    Past Medical History:  Diagnosis Date  . Allergic rhinitis, cause unspecified   . Anemia   . Asthma   . Atrial septal defect  s/p repair;   Echo 08/07/10: EF 55-60%; mild MR; atrial septal aneurysm; no residual ASD  . Chest pain, unspecified    cardiac cath 07/31/10: normal coronaries; vigorous LVF  .  Esophageal reflux   . Fibromyalgia   . Headache(784.0)   . Hematuria, unspecified   . Hypertension    no meds   . Hypothyroidism   . Irritable bowel syndrome   . Low back pain   . Rheumatoid arthritis(714.0)    Dr Herold Harms  . Scoliosis   . Sinoatrial node dysfunction (HCC)    eval. for chronotropic competence completed in past    Family History  Problem Relation Age of Onset  . Hypertension Mother   . Diabetes Mother   . Heart disease Maternal Grandmother   . Stomach cancer Maternal Grandmother 95  . Glaucoma Father   . Breast cancer Other        aunt  . Diabetes Other        GM  . Hypertension Sister   . Thyroid disease Sister   . Hypertension Sister   . Thyroid disease Sister   . Colon cancer Neg Hx    Past Surgical History:  Procedure Laterality Date  . ASD REPAIR  1985  . TONSILLECTOMY AND ADENOIDECTOMY    . TUBAL LIGATION     Social History   Social History Narrative   Household-- pt , her mother and her youngest son    Immunization History  Administered Date(s) Administered  . Influenza Split 03/17/2011, 03/17/2012  . Influenza Whole 04/05/2009, 02/21/2010  . Influenza,inj,Quad PF,6+ Mos 03/21/2013, 03/09/2014, 05/24/2015, 01/27/2016  . Pneumococcal Conjugate-13 01/27/2016  . Pneumococcal Polysaccharide-23 03/08/2015  . Td 07/23/2010     Objective: Vital Signs: BP (!) 146/82 (BP Location: Left Arm, Patient Position: Sitting, Cuff Size: Normal)   Pulse (!) 50   Resp 12   Ht 5\' 9"  (1.753 m)   Wt 135 lb (61.2 kg)   BMI 19.94 kg/m    Physical Exam Vitals and nursing note reviewed.  Constitutional:      Appearance: She is well-developed.  HENT:     Head: Normocephalic and atraumatic.  Eyes:     Conjunctiva/sclera: Conjunctivae normal.  Cardiovascular:     Rate and Rhythm: Normal rate and regular rhythm.     Heart sounds: Normal heart sounds.  Pulmonary:     Effort: Pulmonary effort is normal.     Breath sounds: Normal breath sounds.    Abdominal:     General: Bowel sounds are normal.     Palpations: Abdomen is soft.  Musculoskeletal:     Cervical back: Normal range of motion.  Lymphadenopathy:     Cervical: No cervical adenopathy.  Skin:    General: Skin is warm and dry.     Capillary Refill: Capillary refill takes less than 2 seconds.  Neurological:     Mental Status: She is alert and oriented to person, place, and time.  Psychiatric:        Behavior: Behavior normal.      Musculoskeletal Exam: C-spine, thoracic spine, lumbar spine good range of motion.  No midline spinal tenderness.  She has tenderness of the right SI joint.  Shoulder joints, elbow joints, wrist joints, MCPs, PIPs and DIPs good range of motion no synovitis.  She has complete fist formation bilaterally.  Hip joints, knee joints, ankle joints, MTPs, PIPs, DIPs good range of motion with no synovitis.  No warmth or effusion of bilateral knee joints.  No  tenderness to swelling of ankle joints.  CDAI Exam: CDAI Score: -- Patient Global: --; Provider Global: -- Swollen: --; Tender: -- Joint Exam 06/06/2019   No joint exam has been documented for this visit   There is currently no information documented on the homunculus. Go to the Rheumatology activity and complete the homunculus joint exam.  Investigation: No additional findings.  Imaging: No results found.  Recent Labs: Lab Results  Component Value Date   WBC 4.9 02/01/2019   HGB 13.0 02/01/2019   PLT 175 02/01/2019   NA 145 02/01/2019   K 4.9 02/01/2019   CL 109 02/01/2019   CO2 25 02/01/2019   GLUCOSE 91 02/01/2019   BUN 8 02/01/2019   CREATININE 0.94 02/01/2019   BILITOT 0.4 02/01/2019   ALKPHOS 60 11/30/2017   AST 22 02/01/2019   ALT 16 02/01/2019   PROT 7.9 02/01/2019   ALBUMIN 4.1 11/30/2017   CALCIUM 10.7 (H) 02/01/2019   GFRAA 79 02/01/2019   QFTBGOLDPLUS NEGATIVE 06/23/2018    Speciality Comments: Dexa- 05/27/18 Osteopenia T-Score -1.7 BMD 0.963  Prior therapy  includes: Plaquenil (inadequate response) , Enbrel (injection site reaction) and methotrexate (neutropenia so on low dose).  Procedures:  No procedures performed Allergies: Etanercept, Methotrexate derivatives, and Pregabalin   Assessment / Plan:     Visit Diagnoses: Rheumatoid arthritis involving multiple sites with positive rheumatoid factor +CCP ([place]): She has no synovitis on exam.  She has not had any recent rheumatoid arthritis flares.  She has no joint pain or joint swelling at this time.  She has no morning stiffness or nocturnal pain.  She has not been taking any immunosuppressive agents recently.  She had a one-time dose of Humira on 10/19/2018 but discontinued after developing a sinus infection.  She would like to restart on Humira due to having recurrent iritis flares.  Iritis - Dr. Teodoro Spray, Recurrent iritis: She continues to have recurrent iritis flares.  She states that her flares are typically every other week.  She states occasionally is in one eye but it usually is in both eyes.  She has pain and inflammation in both eyes currently.  Conjunctival injection noted bilaterally.  She has been using prednisone drops as needed but is not noticed any improvement.  She continues to see Dr. Sabra Heck every 3 months.  She would like to restart on Humira.  We will update CBC and CMP today and if labs are stable we will schedule a nurse visit to restart her on Humira.  She will be on Humira 40 mg subcutaneous injections every 14 days.    High risk medication use -She will be restarted on Humira 40 mg subcutaneous injections every 14 days.  She had a one-time injection of Humira on 10/19/2018 but discontinued after developing a sinus infection.  She states that her PCP feels as though her symptoms are due to underlying allergies.  She is being referred to an allergist.  She discontinued methotrexate in January 2020 due to neutropenia and recurrent iritis flares.  CBC and CMP will be drawn today and then  1 month after restarting on Humira.  She does have history of neutropenia so we will follow her lab work closely.  TB Gold negative on 06/23/2018.  Future order for TB Gold was placed today.  TB Gold will be drawn with routine lab work 1 month after starting on Humira.  Plan: CBC with Differential/Platelet, COMPLETE METABOLIC PANEL WITH GFR  Other neutropenia ([place]): CBC and CMP will be  drawn today.  She will require updated CBC and CMP 1 month after starting on Humira  Primary osteoarthritis of both hands: She has no tenderness or synovitis on exam.  She is complete fist formation bilaterally.  Joint protection and muscle strengthening were discussed  Primary osteoarthritis of both feet: She has no feet pain or inflammation at this time.  She is good range of motion of bilateral ankle joints with no discomfort.  She continues to exercise every other day.  DDD (degenerative disc disease), cervical: She has good range of motion no discomfort.  No symptoms of radiculopathy  Pain in thoracic spine - The x-ray showed mild spondylosis and dextroscoliosis of the thoracic spine: She has no midline spinal tenderness or discomfort at this time.  History of scoliosis  Other medical conditions are listed as follows:   History of hypertension  History of hypothyroidism  History of bradycardia/ sinoatrial node dysfunction   History of migraine  History of gastroesophageal reflux (GERD)  History of IBS  Orders: Orders Placed This Encounter  Procedures  . CBC with Differential/Platelet  . COMPLETE METABOLIC PANEL WITH GFR   No orders of the defined types were placed in this encounter.    Follow-Up Instructions: Return in about 3 months (around 09/04/2019) for Rheumatoid arthritis, iritis , Osteoarthritis.   Hazel Sams, PA-C  I examined and evaluated the patient with Hazel Sams PA.  Patient continues to have recurrent iritis.  The risk of recurrent iritis was discussed.  We will obtain labs  today and will start her on Humira soon as possible.  Patient was in agreement.  The plan of care was discussed as noted above.  Bo Merino, MD  Note - This record has been created using Editor, commissioning.  Chart creation errors have been sought, but may not always  have been located. Such creation errors do not reflect on  the standard of medical care.

## 2019-06-06 NOTE — Telephone Encounter (Signed)
Patient has been scheduled for 2:30 pm today to discuss medication options.

## 2019-06-08 ENCOUNTER — Emergency Department (HOSPITAL_BASED_OUTPATIENT_CLINIC_OR_DEPARTMENT_OTHER): Payer: BC Managed Care – PPO

## 2019-06-08 ENCOUNTER — Other Ambulatory Visit: Payer: Self-pay

## 2019-06-08 ENCOUNTER — Encounter (HOSPITAL_BASED_OUTPATIENT_CLINIC_OR_DEPARTMENT_OTHER): Payer: Self-pay | Admitting: *Deleted

## 2019-06-08 ENCOUNTER — Emergency Department (HOSPITAL_BASED_OUTPATIENT_CLINIC_OR_DEPARTMENT_OTHER)
Admission: EM | Admit: 2019-06-08 | Discharge: 2019-06-08 | Disposition: A | Discharge status: Home / Self Care | Payer: BC Managed Care – PPO | Attending: Emergency Medicine | Admitting: Emergency Medicine

## 2019-06-08 DIAGNOSIS — E039 Hypothyroidism, unspecified: Secondary | ICD-10-CM | POA: Diagnosis not present

## 2019-06-08 DIAGNOSIS — R1030 Lower abdominal pain, unspecified: Secondary | ICD-10-CM | POA: Insufficient documentation

## 2019-06-08 DIAGNOSIS — J45909 Unspecified asthma, uncomplicated: Secondary | ICD-10-CM | POA: Insufficient documentation

## 2019-06-08 DIAGNOSIS — Z79899 Other long term (current) drug therapy: Secondary | ICD-10-CM | POA: Diagnosis not present

## 2019-06-08 DIAGNOSIS — I10 Essential (primary) hypertension: Secondary | ICD-10-CM | POA: Diagnosis not present

## 2019-06-08 DIAGNOSIS — R102 Pelvic and perineal pain: Secondary | ICD-10-CM

## 2019-06-08 LAB — URINALYSIS, ROUTINE W REFLEX MICROSCOPIC
Bilirubin Urine: NEGATIVE
Glucose, UA: NEGATIVE mg/dL
Hgb urine dipstick: NEGATIVE
Ketones, ur: NEGATIVE mg/dL
Leukocytes,Ua: NEGATIVE
Nitrite: NEGATIVE
Protein, ur: NEGATIVE mg/dL
Specific Gravity, Urine: 1.01 (ref 1.005–1.030)
pH: 7 (ref 5.0–8.0)

## 2019-06-08 LAB — COMPREHENSIVE METABOLIC PANEL
ALT: 24 U/L (ref 0–44)
AST: 37 U/L (ref 15–41)
Albumin: 4.2 g/dL (ref 3.5–5.0)
Alkaline Phosphatase: 67 U/L (ref 38–126)
Anion gap: 9 (ref 5–15)
BUN: 10 mg/dL (ref 6–20)
CO2: 23 mmol/L (ref 22–32)
Calcium: 9.5 mg/dL (ref 8.9–10.3)
Chloride: 108 mmol/L (ref 98–111)
Creatinine, Ser: 0.72 mg/dL (ref 0.44–1.00)
GFR calc Af Amer: >60 mL/min (ref >60)
GFR calc non Af Amer: >60 mL/min (ref >60)
Glucose, Bld: 95 mg/dL (ref 70–99)
Potassium: 4 mmol/L (ref 3.5–5.1)
Sodium: 140 mmol/L (ref 135–145)
Total Bilirubin: 0.5 mg/dL (ref 0.3–1.2)
Total Protein: 8.6 g/dL — ABNORMAL HIGH (ref 6.5–8.1)

## 2019-06-08 LAB — CBC
HCT: 39.2 % (ref 36.0–46.0)
Hemoglobin: 12.1 g/dL (ref 12.0–15.0)
MCH: 28.2 pg (ref 26.0–34.0)
MCHC: 30.9 g/dL (ref 30.0–36.0)
MCV: 91.4 fL (ref 80.0–100.0)
Platelets: 147 10*3/uL — ABNORMAL LOW (ref 150–400)
RBC: 4.29 MIL/uL (ref 3.87–5.11)
RDW: 12.2 % (ref 11.5–15.5)
WBC: 4.2 10*3/uL (ref 4.0–10.5)
nRBC: 0 % (ref 0.0–0.2)

## 2019-06-08 LAB — LIPASE, BLOOD: Lipase: 42 U/L (ref 11–51)

## 2019-06-08 MED ORDER — IOHEXOL 300 MG/ML  SOLN
100.0000 mL | Freq: Once | INTRAMUSCULAR | Status: AC | PRN
  Administered 2019-06-08: 100 mL via INTRAVENOUS

## 2019-06-08 NOTE — Discharge Instructions (Addendum)
Magnesium citrate: Drink the entire 10 ounce bottle mixed with equal parts Sprite or Gatorade for relief of constipation.  Follow-up with your primary doctor if not improving in the next few days, and return to the ER if you develop worsening pain, high fever, bloody stool or vomit, or other new and concerning symptoms.

## 2019-06-08 NOTE — ED Triage Notes (Addendum)
Pt c/o lower abd pain and nausea  x 1 day. Mag citrate this am with results  But no improvement

## 2019-06-08 NOTE — ED Provider Notes (Signed)
[facility] EMERGENCY DEPARTMENT Provider Note   CSN: [id number] Arrival date & time: 06/08/19  0022     History Chief Complaint  Patient presents with  . Abdominal Pain    Brittany Hodges is a 54 y.o. female.  Patient is a 54 year old female with past medical history of hypertension, rheumatoid arthritis, asthma, and ASD repair.  She presents today for evaluation of abdominal pain.  She describes pain in the suprapubic area that began approximately 24 hours ago.  It has been constant.  She denies any diarrhea or constipation.  She denies any urinary complaints.  She tried taking magnesium citrate which did cause a bowel movement, but has not helped with her discomfort.  She tells me she has had colonoscopies in the past, none of which have been concerning.  The history is provided by the patient.  Abdominal Pain Pain location:  Suprapubic Pain quality: cramping   Pain radiates to:  Does not radiate Pain severity:  Moderate Onset quality:  Sudden Duration:  24 hours Timing:  Constant Progression:  Unchanged Chronicity:  New      Past Medical History:  Diagnosis Date  . Allergic rhinitis, cause unspecified   . Anemia   . Asthma   . Atrial septal defect    s/p repair;   Echo 08/07/10: EF 55-60%; mild MR; atrial septal aneurysm; no residual ASD  . Chest pain, unspecified    cardiac cath 07/31/10: normal coronaries; vigorous LVF  . Esophageal reflux   . Fibromyalgia   . Headache(784.0)   . Hematuria, unspecified   . Hypertension    no meds   . Hypothyroidism   . Irritable bowel syndrome   . Low back pain   . Rheumatoid arthritis(714.0)    Dr Herold Harms  . Scoliosis   . Sinoatrial node dysfunction (HCC)    eval. for chronotropic competence completed in past    Patient Active Problem List   Diagnosis Date Noted  . Asthma 04/06/2016  . Laryngopharyngeal reflux (LPR) 04/06/2016  . PCP NOTES >>>>> 03/09/2015  . Annual physical exam 12/21/2014  .  Cardiomegaly, by MRI 03/16/2014  . Leukopenia 03/10/2014  . Lightheadedness 02/10/2011  . SINOATRIAL NODE DYSFUNCTION 08/06/2008  . ATRIAL SEPTAL DEFECT 06/29/2007  . Hypothyroidism 03/21/2007  . Essential hypertension 03/21/2007  . Allergic rhinitis 03/21/2007  . GERD-- dysphagia 03/21/2007  . CONSTIPATION 03/21/2007  . IRRITABLE BOWEL SYNDROME 03/21/2007  . RA (rheumatoid arthritis) ([place]) 03/21/2007  . LOW BACK PAIN 03/21/2007  . FIBROMYALGIA 03/21/2007    Past Surgical History:  Procedure Laterality Date  . ASD REPAIR  1985  . TONSILLECTOMY AND ADENOIDECTOMY    . TUBAL LIGATION       OB History   No obstetric history on file.     Family History  Problem Relation Age of Onset  . Hypertension Mother   . Diabetes Mother   . Heart disease Maternal Grandmother   . Stomach cancer Maternal Grandmother 95  . Glaucoma Father   . Breast cancer Other        aunt  . Diabetes Other        GM  . Hypertension Sister   . Thyroid disease Sister   . Hypertension Sister   . Thyroid disease Sister   . Colon cancer Neg Hx     Social History   Tobacco Use  . Smoking status: Never Smoker  . Smokeless tobacco: Never Used  . Tobacco comment: never used tobacco  Substance Use Topics  .  Alcohol use: No  . Drug use: No    Home Medications Prior to Admission medications   Medication Sig Start Date End Date Taking? Authorizing Provider  albuterol (PROAIR HFA) 108 (90 Base) MCG/ACT inhaler Inhale 2 puffs into the lungs every 6 (six) hours as needed. For shortness of breath and wheezing 04/06/16   Noralee Space, MD  amLODipine (NORVASC) 5 MG tablet Take 1 tablet (5 mg total) by mouth daily. 12/28/18   Colon Branch, MD  azelastine (ASTELIN) 0.1 % nasal spray Place 2 sprays into both nostrils 2 (two) times daily. 01/31/19   Colon Branch, MD  Cholecalciferol (VITAMIN D) 50 MCG (2000 UT) CAPS Take by mouth daily.    [provider]  fluticasone (FLONASE) 50 MCG/ACT nasal spray  Place 2 sprays into both nostrils daily as needed. 03/23/19   Colon Branch, MD  fluticasone (FLOVENT DISKUS) 50 MCG/BLIST diskus inhaler Inhale into the lungs as needed.     [provider]  Ginger, Zingiber officinalis, (GINGER ROOT PO) Take by mouth.    [provider]  Misc Natural Products (TART CHERRY ADVANCED PO) Take by mouth.    [provider]  pantoprazole (PROTONIX) 40 MG tablet Take 1 tablet (40 mg total) by mouth daily. Take before breakfast 10/06/18   Colon Branch, MD  prednisoLONE acetate (PRED FORTE) 1 % ophthalmic suspension Place 1 drop into both eyes daily.  06/01/18   [provider]  Spacer/Aero-Holding Chambers (AEROCHAMBER MV) inhaler Use as instructed 09/15/12   Noralee Space, MD  TURMERIC PO Take by mouth.    [provider]    Allergies    Etanercept, Methotrexate derivatives, and Pregabalin  Review of Systems   Review of Systems  Gastrointestinal: Positive for abdominal pain.  All other systems reviewed and are negative.   Physical Exam Updated Vital Signs BP (!) 145/86   Pulse 71   Temp 98.8 F (37.1 C)   Resp 16   Ht 5\' 9"  (1.753 m)   Wt 61 kg   LMP 05/01/2016   SpO2 100%   BMI 19.86 kg/m   Physical Exam Vitals and nursing note reviewed.  Constitutional:      General: She is not in acute distress.    Appearance: She is well-developed. She is not diaphoretic.  HENT:     Head: Normocephalic and atraumatic.  Cardiovascular:     Rate and Rhythm: Normal rate and regular rhythm.     Heart sounds: No murmur. No friction rub. No gallop.   Pulmonary:     Effort: Pulmonary effort is normal. No respiratory distress.     Breath sounds: Normal breath sounds. No wheezing.  Abdominal:     General: Bowel sounds are normal. There is no distension.     Palpations: Abdomen is soft.     Tenderness: There is abdominal tenderness in the suprapubic area. There is no right CVA tenderness, left CVA tenderness, guarding or  rebound.  Musculoskeletal:        General: Normal range of motion.     Cervical back: Normal range of motion and neck supple.  Skin:    General: Skin is warm and dry.  Neurological:     Mental Status: She is alert and oriented to person, place, and time.     ED Results / Procedures / Treatments   Labs (all labs ordered are listed, but only abnormal results are displayed) Labs Reviewed  URINALYSIS, ROUTINE W REFLEX MICROSCOPIC -  Abnormal; Notable for the following components:      Result Value   Color, Urine STRAW (*)    All other components within normal limits  CBC - Abnormal; Notable for the following components:   Platelets 147 (*)    All other components within normal limits  LIPASE, BLOOD  COMPREHENSIVE METABOLIC PANEL    EKG None  Radiology No results found.  Procedures Procedures (including critical care time)  Medications Ordered in ED Medications - No data to display  ED Course  I have reviewed the triage vital signs and the nursing notes.  Pertinent labs & imaging results that were available during my care of the patient were reviewed by me and considered in my medical decision making (see chart for details).    MDM Rules/Calculators/A&P  Patient presenting here with complaints of suprapubic pain that started earlier tonight.  This appears to be related to constipation as that is what shows up on her CAT scan.  Her laboratory studies are unremarkable and no other emergent causes been identified.  At this point, patient seems appropriate for discharge with magnesium citrate and as needed return.  Final Clinical Impression(s) / ED Diagnoses Final diagnoses:  None    Rx / DC Orders ED Discharge Orders    None       Veryl Speak, MD 06/08/19 [phone]

## 2019-06-09 ENCOUNTER — Telehealth: Payer: Self-pay

## 2019-06-09 ENCOUNTER — Other Ambulatory Visit (HOSPITAL_COMMUNITY)
Admission: RE | Admit: 2019-06-09 | Discharge: 2019-06-09 | Disposition: A | Discharge status: Home / Self Care | Payer: BC Managed Care – PPO | Source: Ambulatory Visit | Attending: Family Medicine | Admitting: Family Medicine

## 2019-06-09 ENCOUNTER — Other Ambulatory Visit: Payer: Self-pay

## 2019-06-09 ENCOUNTER — Ambulatory Visit: Payer: BC Managed Care – PPO | Admitting: Family Medicine

## 2019-06-09 ENCOUNTER — Encounter: Payer: Self-pay | Admitting: Family Medicine

## 2019-06-09 ENCOUNTER — Ambulatory Visit (HOSPITAL_BASED_OUTPATIENT_CLINIC_OR_DEPARTMENT_OTHER)
Admission: RE | Admit: 2019-06-09 | Discharge: 2019-06-09 | Disposition: A | Discharge status: Home / Self Care | Payer: BC Managed Care – PPO | Source: Ambulatory Visit | Attending: Family Medicine | Admitting: Family Medicine

## 2019-06-09 VITALS — BP 110/70 | HR 96 | Temp 98.4°F | Resp 18 | Ht 69.0 in | Wt 132.0 lb

## 2019-06-09 DIAGNOSIS — R102 Pelvic and perineal pain: Secondary | ICD-10-CM

## 2019-06-09 LAB — POC URINALSYSI DIPSTICK (AUTOMATED)
Bilirubin, UA: NEGATIVE
Blood, UA: NEGATIVE
Glucose, UA: NEGATIVE
Ketones, UA: NEGATIVE
Leukocytes, UA: NEGATIVE
Nitrite, UA: NEGATIVE
Protein, UA: NEGATIVE
Spec Grav, UA: 1.015 (ref 1.010–1.025)
Urobilinogen, UA: 0.2 E.U./dL
pH, UA: 6 (ref 5.0–8.0)

## 2019-06-09 LAB — POCT URINE PREGNANCY: Preg Test, Ur: NEGATIVE

## 2019-06-09 MED ORDER — CIPROFLOXACIN HCL 250 MG PO TABS: 250.0000 mg | ORAL_TABLET | Freq: Two times a day (BID) | ORAL | 0 refills | Status: DC

## 2019-06-09 MED FILL — CIPROFLOXACIN HCL 250 MG TA: 250 | 3 days supply | Qty: 6 | Fill #0

## 2019-06-09 NOTE — Patient Instructions (Signed)
Pelvic Pain, Female Pelvic pain is pain in your lower abdomen, below your belly button and between your hips. The pain may start suddenly (be acute), keep coming back (be recurring), or last a long time (become chronic). Pelvic pain that lasts longer than 6 months is considered chronic. Pelvic pain may affect your:  Reproductive organs.  Urinary system.  Digestive tract.  Musculoskeletal system. There are many potential causes of pelvic pain. Sometimes, the pain can be a result of digestive or urinary conditions, strained muscles or ligaments, or reproductive conditions. Sometimes the cause of pelvic pain is not known. Follow these instructions at home:   Take over-the-counter and prescription medicines only as told by your health care provider.  Rest as told by your health care provider.  Do not have sex if it hurts.  Keep a journal of your pelvic pain. Write down: ? When the pain started. ? Where the pain is located. ? What seems to make the pain better or worse, such as food or your period (menstrual cycle). ? Any symptoms you have along with the pain.  Keep all follow-up visits as told by your health care provider. This is important. Contact a health care provider if:  Medicine does not help your pain.  Your pain comes back.  You have new symptoms.  You have abnormal vaginal discharge or bleeding, including bleeding after menopause.  You have a fever or chills.  You are constipated.  You have blood in your urine or stool.  You have foul-smelling urine.  You feel weak or light-headed. Get help right away if:  You have sudden severe pain.  Your pain gets steadily worse.  You have severe pain along with fever, nausea, vomiting, or excessive sweating.  You lose consciousness. Summary  Pelvic pain is pain in your lower abdomen, below your belly button and between your hips.  There are many potential causes of pelvic pain.  Keep a journal of your pelvic  pain. This information is not intended to replace advice given to you by your health care provider. Make sure you discuss any questions you have with your health care provider. Document Revised: 10/20/2017 Document Reviewed: 10/20/2017 Elsevier Patient Education  2020 [employer] Inc[employer]  

## 2019-06-09 NOTE — Telephone Encounter (Signed)
Patient called in because she has received her test results in her my chart and she does not understand them. The patient would like for some one to follow up with at at [phone] thanks.

## 2019-06-09 NOTE — Telephone Encounter (Signed)
Pt saw Dr. Etter Sjogren today.

## 2019-06-09 NOTE — Addendum Note (Signed)
Addended by: Sanda Linger on: 06/09/2019 01:35 PM   Modules accepted: Orders

## 2019-06-09 NOTE — Progress Notes (Signed)
Patient ID: Brittany Hodges, female    DOB: [birth year]  Age: 54 y.o. MRN: [id number]    Subjective:  Subjective  HPI YARELLY BURI presents for f/u abd pain that started Wednesday  She was in the er yesterday.  Ct abd/pelvis neg except constipation.  Labs were normal.  She was told to use mag citrate and sent home   She does feel much better.  Her abd is not longer distended but she still has significant pain in suprapubic area.  No vag d/c, no dysuria  Review of Systems  Constitutional: Negative for appetite change, diaphoresis, fatigue and unexpected weight change.  Eyes: Negative for pain, redness and visual disturbance.  Respiratory: Negative for cough, chest tightness, shortness of breath and wheezing.   Cardiovascular: Negative for chest pain, palpitations and leg swelling.  Gastrointestinal: Positive for abdominal pain. Negative for anal bleeding, blood in stool, constipation, diarrhea, nausea, rectal pain and vomiting.  Endocrine: Negative for cold intolerance, heat intolerance, polydipsia, polyphagia and polyuria.  Genitourinary: Negative for difficulty urinating, dysuria and frequency.  Neurological: Negative for dizziness, light-headedness, numbness and headaches.    History Past Medical History:  Diagnosis Date  . Allergic rhinitis, cause unspecified   . Anemia   . Asthma   . Atrial septal defect    s/p repair;   Echo 08/07/10: EF 55-60%; mild MR; atrial septal aneurysm; no residual ASD  . Chest pain, unspecified    cardiac cath 07/31/10: normal coronaries; vigorous LVF  . Esophageal reflux   . Fibromyalgia   . Headache(784.0)   . Hematuria, unspecified   . Hypertension    no meds   . Hypothyroidism   . Irritable bowel syndrome   . Low back pain   . Rheumatoid arthritis(714.0)    Dr Herold Harms  . Scoliosis   . Sinoatrial node dysfunction (HCC)    eval. for chronotropic competence completed in past    She has a past surgical history that includes Tubal  ligation; ASD repair (1985); and Tonsillectomy and adenoidectomy.   Her family history includes Breast cancer in an other family member; Diabetes in her mother and another family member; Glaucoma in her father; Heart disease in her maternal grandmother; Hypertension in her mother, sister, and sister; Stomach cancer (age of onset: 61) in her maternal grandmother; Thyroid disease in her sister and sister.She reports that she has never smoked. She has never used smokeless tobacco. She reports that she does not drink alcohol or use drugs.  Current Outpatient Medications on File Prior to Visit  Medication Sig Dispense Refill  . albuterol (PROAIR HFA) 108 (90 Base) MCG/ACT inhaler Inhale 2 puffs into the lungs every 6 (six) hours as needed. For shortness of breath and wheezing 1 Inhaler 6  . amLODipine (NORVASC) 5 MG tablet Take 1 tablet (5 mg total) by mouth daily. 90 tablet 3  . azelastine (ASTELIN) 0.1 % nasal spray Place 2 sprays into both nostrils 2 (two) times daily. 30 mL 6  . Cholecalciferol (VITAMIN D) 50 MCG (2000 UT) CAPS Take by mouth daily.    . fluticasone (FLONASE) 50 MCG/ACT nasal spray Place 2 sprays into both nostrils daily as needed. 16 g 6  . fluticasone (FLOVENT DISKUS) 50 MCG/BLIST diskus inhaler Inhale into the lungs as needed.     . Ginger, Zingiber officinalis, (GINGER ROOT PO) Take by mouth.    . Misc Natural Products (TART CHERRY ADVANCED PO) Take by mouth.    . pantoprazole (PROTONIX) 40 MG tablet Take  1 tablet (40 mg total) by mouth daily. Take before breakfast 30 tablet 3  . prednisoLONE acetate (PRED FORTE) 1 % ophthalmic suspension Place 1 drop into both eyes daily.     Marland Kitchen Spacer/Aero-Holding Chambers (AEROCHAMBER MV) inhaler Use as instructed 1 each 0  . TURMERIC PO Take by mouth.     No current facility-administered medications on file prior to visit.     Objective:  Objective  Physical Exam Vitals and nursing note reviewed.  Constitutional:      Appearance: She  is well-developed.  HENT:     Head: Normocephalic and atraumatic.  Eyes:     Conjunctiva/sclera: Conjunctivae normal.  Neck:     Thyroid: No thyromegaly.     Vascular: No carotid bruit or JVD.  Cardiovascular:     Rate and Rhythm: Normal rate and regular rhythm.     Heart sounds: Normal heart sounds. No murmur.  Pulmonary:     Effort: Pulmonary effort is normal. No respiratory distress.     Breath sounds: Normal breath sounds. No wheezing or rales.  Chest:     Chest wall: No tenderness.  Abdominal:     General: There is no distension.     Palpations: Abdomen is soft.     Tenderness: There is abdominal tenderness in the suprapubic area. There is guarding. There is no right CVA tenderness, left CVA tenderness or rebound. Negative signs include Murphy's sign, Rovsing's sign, McBurney's sign and psoas sign.  Musculoskeletal:     Cervical back: Normal range of motion and neck supple.  Neurological:     Mental Status: She is alert and oriented to person, place, and time.    BP 110/70 (BP Location: Right Arm, Patient Position: Sitting, Cuff Size: Normal)   Pulse 96   Temp 98.4 F (36.9 C) (Temporal)   Resp 18   Ht 5\' 9"  (1.753 m)   Wt 132 lb (59.9 kg)   LMP 05/01/2016   SpO2 98%   BMI 19.49 kg/m  Wt Readings from Last 3 Encounters:  06/09/19 132 lb (59.9 kg)  06/08/19 134 lb 7.7 oz (61 kg)  06/06/19 135 lb (61.2 kg)     Lab Results  Component Value Date   WBC 4.2 06/08/2019   HGB 12.1 06/08/2019   HCT 39.2 06/08/2019   PLT 147 (L) 06/08/2019   GLUCOSE 95 06/08/2019   CHOL 148 01/17/2019   TRIG 73.0 01/17/2019   HDL 65.30 01/17/2019   LDLCALC 68 01/17/2019   ALT 24 06/08/2019   AST 37 06/08/2019   NA 140 06/08/2019   K 4.0 06/08/2019   CL 108 06/08/2019   CREATININE 0.72 06/08/2019   BUN 10 06/08/2019   CO2 23 06/08/2019   TSH 7.08 (H) 01/17/2019   INR 1.0 07/29/2010   HGBA1C 6.0 01/17/2019    CT ABDOMEN PELVIS W CONTRAST  Result Date: 06/08/2019 CLINICAL  DATA:  Lower abdominal pain and nausea for 1 day. EXAM: CT ABDOMEN AND PELVIS WITH CONTRAST TECHNIQUE: Multidetector CT imaging of the abdomen and pelvis was performed using the standard protocol following bolus administration of intravenous contrast. CONTRAST:  166mL OMNIPAQUE IOHEXOL 300 MG/ML  SOLN COMPARISON:  Noncontrast CT 04/03/2015 FINDINGS: Lower chest: No focal airspace disease or pleural effusion. Median sternotomy wires partially included. Hepatobiliary: No focal liver abnormality is seen. No gallstones, gallbladder wall thickening, or biliary dilatation. Pancreas: No ductal dilatation or inflammation. Spleen: Normal in size without focal abnormality. Adrenals/Urinary Tract: Normal adrenal glands. Punctate nonobstructing stone in  the upper left kidney. No hydronephrosis or perinephric edema. Homogeneous renal enhancement with symmetric excretion on delayed phase imaging. Urinary bladder is physiologically distended. No bladder wall thickening. Stomach/Bowel: Bowel evaluation is limited in the absence of enteric contrast and paucity of intra-abdominal fat. Nondistended stomach. Normal positioning of the ligament of Treitz. No small bowel dilatation or obstruction. The appendix is normal, series 5, image 34. Terminal ileum is not well-defined. Fluid-filled cecum, ascending, and transverse colon. Transverse colon is tortuous. Moderate stool in the distal transverse, descending, and rectosigmoid colon. No colonic wall thickening or inflammation. Vascular/Lymphatic: Abdominal aorta is normal in caliber. Portal vein is patent. No adenopathy. Reproductive: Uterus and bilateral adnexa are unremarkable. Other: No free air. No intra-abdominal abscess. Trace free fluid in the pelvis. Musculoskeletal: Chondrocalcinosis of the pubic symphysis. Bone island in the left proximal femur. There are no acute or suspicious osseous abnormalities. IMPRESSION: 1. Fluid-filled proximal colon, with moderate stool distally with  colonic tortuosity. This can be seen with constipation. No evidence of colonic or bowel inflammation. 2. Punctate nonobstructing stone in the upper left kidney. Electronically Signed   By: Keith Rake M.D.   On: 06/08/2019 03:16     Assessment & Plan:  Plan  I am having Ashle C. Marshell Levan start on ciprofloxacin. I am also having her maintain her AeroChamber MV, albuterol, Misc Natural Products (TART CHERRY ADVANCED PO), TURMERIC PO, (Ginger, Zingiber officinalis, (GINGER ROOT PO)), fluticasone, prednisoLONE acetate, Vitamin D, pantoprazole, amLODipine, azelastine, and fluticasone.  Meds ordered this encounter  Medications  . ciprofloxacin (CIPRO) 250 MG tablet    Sig: Take 1 tablet (250 mg total) by mouth 2 (two) times daily.    Dispense:  6 tablet    Refill:  0    Problem List Items Addressed This Visit    None    Visit Diagnoses    Suprapubic pain    -  Primary   Relevant Medications   ciprofloxacin (CIPRO) 250 MG tablet   Other Relevant Orders   POCT urine pregnancy (Completed)   POCT Urinalysis Dipstick (Automated) (Completed)   Urine cytology ancillary only([facility])   US Pelvic Complete With Transvaginal    ua neg Culture pending Urine ancillary pending [place] ordered If pain worsens go to ER Er visit, labs, ct reviewed   Follow-up: Return if symptoms worsen or fail to improve.  Ann Held, DO

## 2019-06-10 ENCOUNTER — Emergency Department (HOSPITAL_COMMUNITY)
Admission: EM | Admit: 2019-06-10 | Discharge: 2019-06-10 | Disposition: A | Discharge status: Home / Self Care | Payer: BC Managed Care – PPO | Attending: Emergency Medicine | Admitting: Emergency Medicine

## 2019-06-10 ENCOUNTER — Encounter (HOSPITAL_COMMUNITY): Payer: Self-pay

## 2019-06-10 ENCOUNTER — Other Ambulatory Visit: Payer: Self-pay

## 2019-06-10 DIAGNOSIS — Z79899 Other long term (current) drug therapy: Secondary | ICD-10-CM | POA: Insufficient documentation

## 2019-06-10 DIAGNOSIS — R102 Pelvic and perineal pain: Secondary | ICD-10-CM | POA: Insufficient documentation

## 2019-06-10 DIAGNOSIS — J45909 Unspecified asthma, uncomplicated: Secondary | ICD-10-CM | POA: Diagnosis not present

## 2019-06-10 DIAGNOSIS — I10 Essential (primary) hypertension: Secondary | ICD-10-CM | POA: Insufficient documentation

## 2019-06-10 DIAGNOSIS — R110 Nausea: Secondary | ICD-10-CM | POA: Diagnosis not present

## 2019-06-10 DIAGNOSIS — E039 Hypothyroidism, unspecified: Secondary | ICD-10-CM | POA: Insufficient documentation

## 2019-06-10 LAB — URINALYSIS, ROUTINE W REFLEX MICROSCOPIC
Bilirubin Urine: NEGATIVE
Glucose, UA: NEGATIVE mg/dL
Hgb urine dipstick: NEGATIVE
Ketones, ur: 5 mg/dL — AB
Leukocytes,Ua: NEGATIVE
Nitrite: NEGATIVE
Protein, ur: NEGATIVE mg/dL
Specific Gravity, Urine: 1.011 (ref 1.005–1.030)
pH: 5 (ref 5.0–8.0)

## 2019-06-10 MED ORDER — SODIUM CHLORIDE 0.9% FLUSH: 3.0000 mL | Freq: Once | INTRAVENOUS | Status: DC

## 2019-06-10 MED ORDER — METHOCARBAMOL 500 MG PO TABS: 500.0000 mg | ORAL_TABLET | Freq: Two times a day (BID) | ORAL | 0 refills | Status: DC

## 2019-06-10 MED ORDER — HYDROCODONE-ACETAMINOPHEN 5-325 MG PO TABS: 1.0000 | ORAL_TABLET | ORAL | 0 refills | Status: DC | PRN

## 2019-06-10 NOTE — ED Notes (Addendum)
Patient ambulated to the restroom without assistance 

## 2019-06-10 NOTE — Discharge Instructions (Addendum)
Follow up with your gynecologist

## 2019-06-10 NOTE — ED Triage Notes (Signed)
Patient c/o lower abdominal pain  X 3 days. Patient states she has nausea only after drinking something. Patient went to [facility] and her PCP. patient denies any dysuria or urinary frequency.

## 2019-06-10 NOTE — ED Notes (Signed)
Urine culture sent to lab with urinalysis.  

## 2019-06-10 NOTE — ED Provider Notes (Signed)
[facility] DEPT Provider Note   CSN: [id number] Arrival date & time: 06/10/19  1156     History Chief Complaint  Patient presents with  . Abdominal Pain  . Nausea    Brittany Hodges is a 54 y.o. female.  54 year old female presents with persistent for pelvic pain times several days.  Seen at [facility] for similar symptoms and had a work-up there including a CT as well as blood work which did not show any acute findings.  Saw her physician yesterday in the office and had a pelvic ultrasound which did show fibroids.  States the pain is persistent and not associate with dysuria or hematuria.  No fever or chills.  No vomiting.  Have been diagnosed with constipation based on her last CT which was treated with magnesium citrate with good resolve.  States the pain is positional and sharp and better with remaining still.        Past Medical History:  Diagnosis Date  . Allergic rhinitis, cause unspecified   . Anemia   . Asthma   . Atrial septal defect    s/p repair;   Echo 08/07/10: EF 55-60%; mild MR; atrial septal aneurysm; no residual ASD  . Chest pain, unspecified    cardiac cath 07/31/10: normal coronaries; vigorous LVF  . Esophageal reflux   . Fibromyalgia   . Headache(784.0)   . Hematuria, unspecified   . Hypertension    no meds   . Hypothyroidism   . Irritable bowel syndrome   . Low back pain   . Rheumatoid arthritis(714.0)    Dr Herold Harms  . Scoliosis   . Sinoatrial node dysfunction (HCC)    eval. for chronotropic competence completed in past    Patient Active Problem List   Diagnosis Date Noted  . Asthma 04/06/2016  . Laryngopharyngeal reflux (LPR) 04/06/2016  . PCP NOTES >>>>> 03/09/2015  . Annual physical exam 12/21/2014  . Cardiomegaly, by MRI 03/16/2014  . Leukopenia 03/10/2014  . Lightheadedness 02/10/2011  . SINOATRIAL NODE DYSFUNCTION 08/06/2008  . ATRIAL SEPTAL DEFECT 06/29/2007  . Hypothyroidism  03/21/2007  . Essential hypertension 03/21/2007  . Allergic rhinitis 03/21/2007  . GERD-- dysphagia 03/21/2007  . CONSTIPATION 03/21/2007  . IRRITABLE BOWEL SYNDROME 03/21/2007  . RA (rheumatoid arthritis) ([place]) 03/21/2007  . LOW BACK PAIN 03/21/2007  . FIBROMYALGIA 03/21/2007    Past Surgical History:  Procedure Laterality Date  . ASD REPAIR  1985  . TONSILLECTOMY AND ADENOIDECTOMY    . TUBAL LIGATION       OB History   No obstetric history on file.     Family History  Problem Relation Age of Onset  . Hypertension Mother   . Diabetes Mother   . Heart disease Maternal Grandmother   . Stomach cancer Maternal Grandmother 95  . Glaucoma Father   . Breast cancer Other        aunt  . Diabetes Other        GM  . Hypertension Sister   . Thyroid disease Sister   . Hypertension Sister   . Thyroid disease Sister   . Colon cancer Neg Hx     Social History   Tobacco Use  . Smoking status: Never Smoker  . Smokeless tobacco: Never Used  . Tobacco comment: never used tobacco  Substance Use Topics  . Alcohol use: No  . Drug use: No    Home Medications Prior to Admission medications   Medication Sig Start Date End  Date Taking? Authorizing Provider  albuterol (PROAIR HFA) 108 (90 Base) MCG/ACT inhaler Inhale 2 puffs into the lungs every 6 (six) hours as needed. For shortness of breath and wheezing 04/06/16   Noralee Space, MD  amLODipine (NORVASC) 5 MG tablet Take 1 tablet (5 mg total) by mouth daily. 12/28/18   Colon Branch, MD  azelastine (ASTELIN) 0.1 % nasal spray Place 2 sprays into both nostrils 2 (two) times daily. 01/31/19   Colon Branch, MD  Cholecalciferol (VITAMIN D) 50 MCG (2000 UT) CAPS Take by mouth daily.    [provider]  ciprofloxacin (CIPRO) 250 MG tablet Take 1 tablet (250 mg total) by mouth 2 (two) times daily. 06/09/19   Roma Schanz R, DO  fluticasone (FLONASE) 50 MCG/ACT nasal spray Place 2 sprays into both nostrils daily as needed.  03/23/19   Colon Branch, MD  fluticasone (FLOVENT DISKUS) 50 MCG/BLIST diskus inhaler Inhale into the lungs as needed.     [provider]  Ginger, Zingiber officinalis, (GINGER ROOT PO) Take by mouth.    [provider]  Misc Natural Products (TART CHERRY ADVANCED PO) Take by mouth.    [provider]  pantoprazole (PROTONIX) 40 MG tablet Take 1 tablet (40 mg total) by mouth daily. Take before breakfast 10/06/18   Colon Branch, MD  prednisoLONE acetate (PRED FORTE) 1 % ophthalmic suspension Place 1 drop into both eyes daily.  06/01/18   [provider]  Spacer/Aero-Holding Chambers (AEROCHAMBER MV) inhaler Use as instructed 09/15/12   Noralee Space, MD  TURMERIC PO Take by mouth.    [provider]    Allergies    Etanercept, Methotrexate derivatives, and Pregabalin  Review of Systems   Review of Systems  All other systems reviewed and are negative.   Physical Exam Updated Vital Signs BP (!) 140/97 (BP Location: Left Arm)   Pulse 89   Temp 98 F (36.7 C) (Oral)   Resp 18   Ht 1.753 m (5\' 9" )   Wt 59.9 kg   LMP 05/01/2016   SpO2 100%   BMI 19.49 kg/m   Physical Exam Vitals and nursing note reviewed.  Constitutional:      General: She is not in acute distress.    Appearance: Normal appearance. She is well-developed. She is not toxic-appearing.  HENT:     Head: Normocephalic and atraumatic.  Eyes:     General: Lids are normal.     Conjunctiva/sclera: Conjunctivae normal.     Pupils: Pupils are equal, round, and reactive to light.  Neck:     Thyroid: No thyroid mass.     Trachea: No tracheal deviation.  Cardiovascular:     Rate and Rhythm: Normal rate and regular rhythm.     Heart sounds: Normal heart sounds. No murmur. No gallop.   Pulmonary:     Effort: Pulmonary effort is normal. No respiratory distress.     Breath sounds: Normal breath sounds. No stridor. No decreased breath sounds, wheezing, rhonchi or rales.  Abdominal:      General: Bowel sounds are normal. There is no distension.     Palpations: Abdomen is soft.     Tenderness: There is no abdominal tenderness. There is no rebound.    Musculoskeletal:        General: No tenderness. Normal range of motion.     Cervical back: Normal range of motion and neck supple.  Skin:    General: Skin is warm  and dry.     Findings: No abrasion or rash.  Neurological:     Mental Status: She is alert and oriented to person, place, and time.     GCS: GCS eye subscore is 4. GCS verbal subscore is 5. GCS motor subscore is 6.     Cranial Nerves: No cranial nerve deficit.     Sensory: No sensory deficit.  Psychiatric:        Speech: Speech normal.        Behavior: Behavior normal.     ED Results / Procedures / Treatments   Labs (all labs ordered are listed, but only abnormal results are displayed) Labs Reviewed  URINALYSIS, ROUTINE W REFLEX MICROSCOPIC - Abnormal; Notable for the following components:      Result Value   Ketones, ur 5 (*)    All other components within normal limits    EKG None  Radiology US Pelvic Complete With Transvaginal  Result Date: 06/09/2019 CLINICAL DATA:  Suprapubic pain, follow-up CT examination EXAM: TRANSABDOMINAL AND TRANSVAGINAL ULTRASOUND OF PELVIS TECHNIQUE: Both transabdominal and transvaginal ultrasound examinations of the pelvis were performed. Transabdominal technique was performed for global imaging of the pelvis including uterus, ovaries, adnexal regions, and pelvic cul-de-sac. It was necessary to proceed with endovaginal exam following the transabdominal exam to visualize the ovaries. COMPARISON:  06/08/2019 FINDINGS: Uterus Measurements: 7.1 x 3.0 x 3.7 cm. = volume: 42 mL. Small hypoechoic focus is noted in the fundus of the uterus measuring 1.2 cm consistent with a uterine fibroid. Nabothian cysts are seen as well. Endometrium Thickness: 1.1 mm.  No focal abnormality visualized. Right ovary Measurements: 2.6 x 1.6 x 1.9 cm.  = volume: 4 mL. Normal appearance/no adnexal mass. Left ovary Measurements: 1.3 x 1.9 x 2.0 cm. = volume: 2.8 mL. Normal appearance/no adnexal mass. Other findings No abnormal free fluid. IMPRESSION: Small uterine fibroid. Nabothian cysts. No acute abnormality noted. Electronically Signed   By: Inez Catalina M.D.   On: 06/09/2019 13:28    Procedures Procedures (including critical care time)  Medications Ordered in ED Medications - No data to display  ED Course  I have reviewed the triage vital signs and the nursing notes.  Pertinent labs & imaging results that were available during my care of the patient were reviewed by me and considered in my medical decision making (see chart for details).    MDM Rules/Calculators/A&P                      Patient with stable vital signs here.  Recent work-up with negative laboratory study.  Suspect that patient has current symptoms from her fibroid which was seen on pelvic ultrasound.  She does not have any urinary symptoms.  She has no vaginal bleeding or discharge currently.  Will treat with analgesics and return precautions given and she will follow with her GYN doctor. Final Clinical Impression(s) / ED Diagnoses Final diagnoses:  None    Rx / DC Orders ED Discharge Orders    None       Lacretia Leigh, MD 06/10/19 1230

## 2019-06-11 LAB — URINE CULTURE
MICRO NUMBER:: 10071041
SPECIMEN QUALITY:: ADEQUATE

## 2019-06-12 ENCOUNTER — Encounter: Payer: Self-pay | Admitting: *Deleted

## 2019-06-12 ENCOUNTER — Other Ambulatory Visit: Payer: Self-pay | Admitting: *Deleted

## 2019-06-12 ENCOUNTER — Telehealth: Payer: Self-pay | Admitting: *Deleted

## 2019-06-12 ENCOUNTER — Telehealth: Payer: Self-pay | Admitting: Internal Medicine

## 2019-06-12 DIAGNOSIS — R102 Pelvic and perineal pain: Secondary | ICD-10-CM

## 2019-06-12 DIAGNOSIS — D219 Benign neoplasm of connective and other soft tissue, unspecified: Secondary | ICD-10-CM

## 2019-06-12 NOTE — Telephone Encounter (Signed)
Patient was seen in ER over weekend due to the same  pain from Fri when she saw [place].  She was told to reach out to her OBGYN. She states that she cant go back to work until then due to the medication that [place] put her on. She not able to get an appt with her OB until Friday. She is now requesting for a note from [facility] that keeps her out of work her appt. Please advise

## 2019-06-12 NOTE — Telephone Encounter (Signed)
Okay to write note? Please advise

## 2019-06-12 NOTE — Telephone Encounter (Signed)
Ok to give note 

## 2019-06-12 NOTE — Telephone Encounter (Signed)
Spoke with patient. Placing letter at the front. Pt states she may call back with fax number to fax to work.

## 2019-06-12 NOTE — Telephone Encounter (Signed)
Pt called with information to fax letter to her employer. Faxed to attn: [employer] Coder Fax# [phone]

## 2019-06-12 NOTE — Telephone Encounter (Signed)
Patient needed work note from [place] from last Wednesday til whenever.  Note given to return to work on Thursday.  If she does not get in with gyn she is to let us know.

## 2019-06-14 LAB — URINE CYTOLOGY ANCILLARY ONLY
Bacterial Vaginitis-Urine: NEGATIVE
Candida Urine: NEGATIVE
Chlamydia: NEGATIVE
Comment: NEGATIVE
Comment: NEGATIVE
Comment: NORMAL
Neisseria Gonorrhea: NEGATIVE
Trichomonas: NEGATIVE

## 2019-06-14 LAB — COMPLETE METABOLIC PANEL WITH GFR
AG Ratio: 1.2 (calc) (ref 1.0–2.5)
ALT: 11 U/L (ref 6–29)
AST: 22 U/L (ref 10–35)
Albumin: 4.1 g/dL (ref 3.6–5.1)
Alkaline phosphatase (APISO): 57 U/L (ref 37–153)
BUN: 10 mg/dL (ref 7–25)
CO2: 27 mmol/L (ref 20–32)
Calcium: 9.8 mg/dL (ref 8.6–10.4)
Chloride: 104 mmol/L (ref 98–110)
Creat: 0.83 mg/dL (ref 0.50–1.05)
GFR, Est African American: 92 mL/min/{1.73_m2} (ref >=60)
GFR, Est Non African American: 79 mL/min/{1.73_m2} (ref >=60)
Globulin: 3.4 g/dL (calc) (ref 1.9–3.7)
Glucose, Bld: 98 mg/dL (ref 65–99)
Potassium: 4 mmol/L (ref 3.5–5.3)
Sodium: 139 mmol/L (ref 135–146)
Total Bilirubin: 0.5 mg/dL (ref 0.2–1.2)
Total Protein: 7.5 g/dL (ref 6.1–8.1)

## 2019-06-14 LAB — CBC WITH DIFFERENTIAL/PLATELET
Absolute Monocytes: 511 cells/uL (ref 200–950)
Basophils Absolute: 9 cells/uL (ref 0–200)
Basophils Relative: 0.4 %
Eosinophils Absolute: 21 cells/uL (ref 15–500)
Eosinophils Relative: 0.9 %
HCT: 36.7 % (ref 35.0–45.0)
Hemoglobin: 11.9 g/dL (ref 11.7–15.5)
Lymphs Abs: 858 cells/uL (ref 850–3900)
MCH: 28.6 pg (ref 27.0–33.0)
MCHC: 32.4 g/dL (ref 32.0–36.0)
MCV: 88.2 fL (ref 80.0–100.0)
MPV: 12.9 fL — ABNORMAL HIGH (ref 7.5–12.5)
Monocytes Relative: 22.2 %
Neutro Abs: 902 cells/uL — ABNORMAL LOW (ref 1500–7800)
Neutrophils Relative %: 39.2 %
Platelets: 154 10*3/uL (ref 140–400)
RBC: 4.16 10*6/uL (ref 3.80–5.10)
RDW: 11.7 % (ref 11.0–15.0)
Total Lymphocyte: 37.3 %
WBC: 2.3 10*3/uL — ABNORMAL LOW (ref 3.8–10.8)

## 2019-06-15 NOTE — Progress Notes (Signed)
WBC count is low-2.3.  She has chronic neutropenia. 7 days ago WBC count was WNL. Please make sure the patient has not had any recent infections. I reviewed recent ED notes. I spoke with Dr. Estanislado Pandy and she is ok with scheduling a nurse visit to restart the patient on Humira. She continues to have recurrent iritis flares. We will have to monitor her lab work very closely while on Humira.  She will require updated CBC 2 weeks after starting on Humira. Depending on the severity of the neutropenia we may have to space Humira dosing after reviewing updated lab work.   CMP WNL

## 2019-06-19 ENCOUNTER — Telehealth: Payer: Self-pay | Admitting: *Deleted

## 2019-06-19 NOTE — Telephone Encounter (Signed)
Patient states she was started on antibiotics by her PCP. Patient was scheduled for 06/20/19 to restart Humira. Appointment has been cancelled and patient will call back to reschedule once she has completed antibiotics and is well.

## 2019-06-20 ENCOUNTER — Ambulatory Visit: Payer: BC Managed Care – PPO

## 2019-06-26 ENCOUNTER — Encounter: Payer: Self-pay | Admitting: Internal Medicine

## 2019-06-27 ENCOUNTER — Telehealth: Payer: Self-pay

## 2019-06-27 DIAGNOSIS — Z0279 Encounter for issue of other medical certificate: Secondary | ICD-10-CM

## 2019-06-27 NOTE — Telephone Encounter (Signed)
FMLA received/completed, awaiting PCP signature- placed in red folder for signature. (Pt aware PCP out of office)

## 2019-06-28 NOTE — Telephone Encounter (Signed)
FMLA paperwork completed- faxed back to Pt at [phone]. Form sent for scanning. Received fax confirmation.

## 2019-06-29 ENCOUNTER — Telehealth: Payer: Self-pay

## 2019-06-29 DIAGNOSIS — Z0279 Encounter for issue of other medical certificate: Secondary | ICD-10-CM

## 2019-06-29 NOTE — Telephone Encounter (Signed)
Intermittent FMLA paperwork completed and faxed back to Pt at [phone]. Form sent for scanning. Received fax confirmation.

## 2019-07-18 ENCOUNTER — Other Ambulatory Visit (INDEPENDENT_AMBULATORY_CARE_PROVIDER_SITE_OTHER): Payer: BC Managed Care – PPO

## 2019-07-18 ENCOUNTER — Other Ambulatory Visit: Payer: Self-pay

## 2019-07-18 DIAGNOSIS — E039 Hypothyroidism, unspecified: Secondary | ICD-10-CM

## 2019-07-18 LAB — T4, FREE: Free T4: 0.8 ng/dL (ref 0.60–1.60)

## 2019-07-18 LAB — TSH: TSH: 3.66 u[IU]/mL (ref 0.35–4.50)

## 2019-07-21 ENCOUNTER — Ambulatory Visit: Payer: BC Managed Care – PPO | Attending: Internal Medicine

## 2019-07-21 DIAGNOSIS — Z20822 Contact with and (suspected) exposure to covid-19: Secondary | ICD-10-CM

## 2019-07-22 LAB — NOVEL CORONAVIRUS, NAA: SARS-CoV-2, NAA: NOT DETECTED

## 2019-08-01 ENCOUNTER — Encounter: Payer: Self-pay | Admitting: Internal Medicine

## 2019-08-01 DIAGNOSIS — J309 Allergic rhinitis, unspecified: Secondary | ICD-10-CM

## 2019-09-14 ENCOUNTER — Other Ambulatory Visit: Payer: Self-pay

## 2019-09-14 ENCOUNTER — Encounter: Payer: Self-pay | Admitting: Allergy and Immunology

## 2019-09-14 ENCOUNTER — Ambulatory Visit: Payer: BC Managed Care – PPO | Admitting: Allergy and Immunology

## 2019-09-14 VITALS — BP 140/80 | HR 44 | Temp 97.9°F | Resp 20 | Ht 68.0 in | Wt 131.4 lb

## 2019-09-14 DIAGNOSIS — H1013 Acute atopic conjunctivitis, bilateral: Secondary | ICD-10-CM

## 2019-09-14 DIAGNOSIS — J3089 Other allergic rhinitis: Secondary | ICD-10-CM

## 2019-09-14 DIAGNOSIS — J329 Chronic sinusitis, unspecified: Secondary | ICD-10-CM | POA: Insufficient documentation

## 2019-09-14 DIAGNOSIS — H1010 Acute atopic conjunctivitis, unspecified eye: Secondary | ICD-10-CM | POA: Insufficient documentation

## 2019-09-14 DIAGNOSIS — J320 Chronic maxillary sinusitis: Secondary | ICD-10-CM

## 2019-09-14 DIAGNOSIS — K9049 Malabsorption due to intolerance, not elsewhere classified: Secondary | ICD-10-CM | POA: Insufficient documentation

## 2019-09-14 DIAGNOSIS — J4520 Mild intermittent asthma, uncomplicated: Secondary | ICD-10-CM

## 2019-09-14 MED ORDER — OLOPATADINE HCL 0.2 % OP SOLN
OPHTHALMIC | 5 refills | Status: DC
Start: 2019-09-14 — End: 2019-11-14

## 2019-09-14 MED ORDER — AZELASTINE HCL 0.1 % NA SOLN: NASAL | 5 refills | Status: DC

## 2019-09-14 MED ORDER — XHANCE 93 MCG/ACT NA EXHU: INHALANT_SUSPENSION | NASAL | 5 refills | Status: DC

## 2019-09-14 MED ORDER — LEVOCETIRIZINE DIHYDROCHLORIDE 5 MG PO TABS: ORAL_TABLET | ORAL | 5 refills | Status: DC

## 2019-09-14 NOTE — Patient Instructions (Addendum)
Perennial allergic rhinitis  Aeroallergen avoidance measures have been discussed and provided in written form.  A prescription has been provided for levocetirizine (Xyzal), 5 mg daily as needed.  To avoid diminishing benefit with daily use (tachyphylaxis) of second generation antihistamine, consider alternating every few months between fexofenadine (Allegra) and levocetirizine (Xyzal).  A prescription has been provided for [facility], 2 actuations per nostril twice a day as needed. Proper technique has been discussed and demonstrated.  Continue azelastine nasal spray, 1 to 2 sprays per nostril twice daily as needed.  Nasal saline spray (i.e., Simply Saline) or nasal saline lavage (i.e., NeilMed) is recommended as needed and prior to medicated nasal sprays.  If allergen avoidance measures and medications fail to adequately relieve symptoms, aeroallergen immunotherapy will be considered.  Chronic sinusitis  Treatment plan as outlined above for allergic rhinitis.  Nasal saline lavage (NeilMed) has been recommended as needed and prior to medicated nasal sprays along with instructions for proper administration.  For thick post nasal drainage, nasal congestion, and/or sinus pressure, add guaifenesin 1200 mg (Mucinex Maximum Strength) plus/minus pseudoephedrine 120 mg  twice daily as needed with adequate hydration as discussed. Pseudoephedrine is only to be used for short-term relief of nasal/sinus congestion. Long-term use is discouraged due to potential side effects.  Allergic conjunctivitis  Treatment plan as outlined above for allergic rhinitis.  A prescription has been provided for generic Pataday, one drop per eye daily as needed.  I have also recommended eye lubricant drops (i.e., Natural Tears) as needed.  Intolerance, food The patient's history suggests food intolerance.  Food allergen skin test today were negative despite a positive histamine control.  The negative predictive value  for food allergen skin testing is excellent (approximately 95%).  The patient has never had systemic symptoms.  The oral irritation is most likely due to the acidity of the fruits.  Avoid any foods which cause untoward symptoms.  For any symptoms concerning for anaphylaxis, 911 is to be called immediately.  Asthmatic bronchitis  Continue albuterol HFA, 1 to 2 inhalations every 4-6 hours if needed.  Subjective and objective measures of pulmonary function will be followed and the treatment plan will be adjusted accordingly.   Return in about 3 months (around 12/14/2019), or if symptoms worsen or fail to improve.  Control of Dust Mite Allergen  House dust mites play a major role in allergic asthma and rhinitis.  They occur in environments with high humidity wherever human skin, the food for dust mites is found. High levels have been detected in dust obtained from mattresses, pillows, carpets, upholstered furniture, bed covers, clothes and soft toys.  The principal allergen of the house dust mite is found in its feces.  A gram of dust may contain 1,000 mites and 250,000 fecal particles.  Mite antigen is easily measured in the air during house cleaning activities.    1. Encase mattresses, including the box spring, and pillow, in an air tight cover.  Seal the zipper end of the encased mattresses with wide adhesive tape. 2. Wash the bedding in water of 130 degrees Farenheit weekly.  Avoid cotton comforters/quilts and flannel bedding: the most ideal bed covering is the dacron comforter. 3. Remove all upholstered furniture from the bedroom. 4. Remove carpets, carpet padding, rugs, and non-washable window drapes from the bedroom.  Wash drapes weekly or use plastic window coverings. 5. Remove all non-washable stuffed toys from the bedroom.  Wash stuffed toys weekly. 6. Have the room cleaned frequently with a vacuum cleaner  and a damp dust-mop.  The patient should not be in a room which is being cleaned  and should wait 1 hour after cleaning before going into the room. 7. Close and seal all heating outlets in the bedroom.  Otherwise, the room will become filled with dust-laden air.  An electric heater can be used to heat the room. Reduce indoor humidity to less than 50%.  Do not use a humidifier.   Control of Dog or Cat Allergen  Avoidance is the best way to manage a dog or cat allergy. If you have a dog or cat and are allergic to dog or cats, consider removing the dog or cat from the home. If you have a dog or cat but don't want to find it a new home, or if your family wants a pet even though someone in the household is allergic, here are some strategies that may help keep symptoms at bay:  1. Keep the pet out of your bedroom and restrict it to only a few rooms. Be advised that keeping the dog or cat in only one room will not limit the allergens to that room. 2. Don't pet, hug or kiss the dog or cat; if you do, wash your hands with soap and water. 3. High-efficiency particulate air (HEPA) cleaners run continuously in a bedroom or living room can reduce allergen levels over time. 4. Place electrostatic material sheet in the air inlet vent in the bedroom. 5. Regular use of a high-efficiency vacuum cleaner or a central vacuum can reduce allergen levels. 6. Giving your dog or cat a bath at least once a week can reduce airborne allergen.   Control of Mold Allergen  Mold and fungi can grow on a variety of surfaces provided certain temperature and moisture conditions exist.  Outdoor molds grow on plants, decaying vegetation and soil.  The major outdoor mold, Alternaria and Cladosporium, are found in very high numbers during hot and dry conditions.  Generally, a late Summer - Fall peak is seen for common outdoor fungal spores.  Rain will temporarily lower outdoor mold spore count, but counts rise rapidly when the rainy period ends.  The most important indoor molds are Aspergillus and Penicillium.  Dark,  humid and poorly ventilated basements are ideal sites for mold growth.  The next most common sites of mold growth are the bathroom and the kitchen.  Outdoor [employer] 1. Use air conditioning and keep windows closed 2. Avoid exposure to decaying vegetation. 3. Avoid leaf raking. 4. Avoid grain handling. 5. Consider wearing a face mask if working in moldy areas.  Indoor Mold Control 1. Maintain humidity below 50%. 2. Clean washable surfaces with 5% bleach solution. 3. Remove sources e.g. Contaminated carpets.   Control of Cockroach Allergen  Cockroach allergen has been identified as an important cause of acute attacks of asthma, especially in urban settings.  There are fifty-five species of cockroach that exist in the [place], however only three, the [place], [place] species produce allergen that can affect patients with Asthma.  Allergens can be obtained from fecal particles, egg casings and secretions from cockroaches.    1. Remove food sources. 2. Reduce access to water. 3. Seal access and entry points. 4. Spray runways with 0.5-1% Diazinon or Chlorpyrifos 5. Blow boric acid power under stoves and refrigerator. 6. Place bait stations (hydramethylnon) at feeding sites.

## 2019-09-14 NOTE — Assessment & Plan Note (Deleted)
   Treatment plan as outlined above for allergic rhinitis.  A prescription has been provided for generic Pataday, one drop per eye daily as needed.  I have also recommended eye lubricant drops (i.e., Natural Tears) as needed.

## 2019-09-14 NOTE — Assessment & Plan Note (Signed)
   Treatment plan as outlined above for allergic rhinitis.  Nasal saline lavage (NeilMed) has been recommended as needed and prior to medicated nasal sprays along with instructions for proper administration.  For thick post nasal drainage, nasal congestion, and/or sinus pressure, add guaifenesin 1200 mg (Mucinex Maximum Strength) plus/minus pseudoephedrine 120 mg  twice daily as needed with adequate hydration as discussed. Pseudoephedrine is only to be used for short-term relief of nasal/sinus congestion. Long-term use is discouraged due to potential side effects.

## 2019-09-14 NOTE — Assessment & Plan Note (Signed)
   Continue albuterol HFA, 1 to 2 inhalations every 4-6 hours if needed.  Subjective and objective measures of pulmonary function will be followed and the treatment plan will be adjusted accordingly.

## 2019-09-14 NOTE — Assessment & Plan Note (Signed)
The patient's history suggests food intolerance.  Food allergen skin test today were negative despite a positive histamine control.  The negative predictive value for food allergen skin testing is excellent (approximately 95%).  The patient has never had systemic symptoms.  The oral irritation is most likely due to the acidity of the fruits.  Avoid any foods which cause untoward symptoms.  For any symptoms concerning for anaphylaxis, 911 is to be called immediately.

## 2019-09-14 NOTE — Assessment & Plan Note (Signed)
   Treatment plan as outlined above for allergic rhinitis.  A prescription has been provided for generic Pataday, one drop per eye daily as needed.  I have also recommended eye lubricant drops (i.e., Natural Tears) as needed.

## 2019-09-14 NOTE — Progress Notes (Signed)
New Patient Note  RE: Brittany Hodges MRN: [id number] DOB: Dec 05, 1965 Date of Office Visit: 09/14/2019  Referring provider: Colon Branch, MD Primary care provider: Colon Branch, MD  Chief Complaint: Allergic Rhinitis  and Asthma   History of present illness: Brittany Hodges is a 54 y.o. female seen today in consultation requested by Brittany November, MD.  She complains of nasal congestion, rhinorrhea, sneezing, postnasal drainage, nasal pruritus, ocular pruritus, and sinus pressure.  These symptoms occur year around but tend to be more frequent and severe during the change of seasons.  She typically has 2-3 sinus infections per year requiring antibiotics and/or steroids. Approximately 10 or 15 years ago she was prescribed albuterol HFA because of recurrent bronchitis.  She is uncertain of bronchitis triggers.  At one point, she had been prescribed Flovent discus, however is not taking this medication currently.  She rarely requires albuterol rescue and denies limitations in normal daily activities or nocturnal awakenings due to lower respiratory symptoms. Vivien Rota notes that on occasion she experiences some mouth "irritation" when consuming pineapple and other citrus fruit.  She denies concomitant urticaria, angioedema, cardiopulmonary symptoms, and/or other GI symptoms.  Assessment and plan: Perennial allergic rhinitis  Aeroallergen avoidance measures have been discussed and provided in written form.  A prescription has been provided for levocetirizine (Xyzal), 5 mg daily as needed.  To avoid diminishing benefit with daily use (tachyphylaxis) of second generation antihistamine, consider alternating every few months between fexofenadine (Allegra) and levocetirizine (Xyzal).  A prescription has been provided for [facility], 2 actuations per nostril twice a day as needed. Proper technique has been discussed and demonstrated.  Continue azelastine nasal spray, 1 to 2 sprays per nostril twice  daily as needed.  Nasal saline spray (i.e., Simply Saline) or nasal saline lavage (i.e., NeilMed) is recommended as needed and prior to medicated nasal sprays.  If allergen avoidance measures and medications fail to adequately relieve symptoms, aeroallergen immunotherapy will be considered.  Chronic sinusitis  Treatment plan as outlined above for allergic rhinitis.  Nasal saline lavage (NeilMed) has been recommended as needed and prior to medicated nasal sprays along with instructions for proper administration.  For thick post nasal drainage, nasal congestion, and/or sinus pressure, add guaifenesin 1200 mg (Mucinex Maximum Strength) plus/minus pseudoephedrine 120 mg  twice daily as needed with adequate hydration as discussed. Pseudoephedrine is only to be used for short-term relief of nasal/sinus congestion. Long-term use is discouraged due to potential side effects.  Allergic conjunctivitis  Treatment plan as outlined above for allergic rhinitis.  A prescription has been provided for generic Pataday, one drop per eye daily as needed.  I have also recommended eye lubricant drops (i.e., Natural Tears) as needed.  Intolerance, food The patient's history suggests food intolerance.  Food allergen skin test today were negative despite a positive histamine control.  The negative predictive value for food allergen skin testing is excellent (approximately 95%).  The patient has never had systemic symptoms.  The oral irritation is most likely due to the acidity of the fruits.  Avoid any foods which cause untoward symptoms.  For any symptoms concerning for anaphylaxis, 911 is to be called immediately.  Asthmatic bronchitis  Continue albuterol HFA, 1 to 2 inhalations every 4-6 hours if needed.  Subjective and objective measures of pulmonary function will be followed and the treatment plan will be adjusted accordingly.   Meds ordered this encounter  Medications  . levocetirizine (XYZAL) 5  MG tablet  Sig: Take 1 tablet once daily as needed for runny nose or itchy eyes    Dispense:  30 tablet    Refill:  5  . Fluticasone Propionate (XHANCE) 93 MCG/ACT EXHU    Sig: 2 sprays per nostril twice daily.    Dispense:  32 mL    Refill:  5  . azelastine (ASTELIN) 0.1 % nasal spray    Sig: 1-2 sprays per nostril twice daily as needed for runny nose.    Dispense:  30 mL    Refill:  5  . Olopatadine HCl (PATADAY) 0.2 % SOLN    Sig: 1 drop each eye once daily if needed for itchy eyes.    Dispense:  2.5 mL    Refill:  5    Diagnostics: Spirometry: FVC was 2.63 L and FEV1 was 2.04 L (79% predicted) with 80 mL (4%) postbronchodilator improvement.  This study was performed while the patient was asymptomatic.  Please see scanned spirometry results for details. Environmental skin testing: Positive to molds, cat hair, dog epithelia, cockroach antigen, and dust mite antigen. Food allergen skin testing: Negative despite a positive histamine control.    Physical examination: Blood pressure 140/80, pulse (!) 44, temperature 97.9 F (36.6 C), temperature source Oral, resp. rate 20, height 5\' 8"  (1.727 m), weight 131 lb 6.3 oz (59.6 kg), last menstrual period 05/01/2016.  General: Alert, interactive, in no acute distress. HEENT: TMs pearly gray, turbinates moderately edematous with clear discharge, post-pharynx moderately erythematous. Neck: Supple without lymphadenopathy. Lungs: Clear to auscultation without wheezing, rhonchi or rales. CV: Normal S1, S2 without murmurs. Abdomen: Nondistended, nontender. Skin: Warm and dry, without lesions or rashes. Extremities:  No clubbing, cyanosis or edema. Neuro:   Grossly intact.  Review of systems:  Review of systems negative except as noted in HPI / PMHx or noted below: Review of Systems  Constitutional: Negative.   HENT: Negative.   Eyes: Negative.   Respiratory: Negative.   Cardiovascular: Negative.   Gastrointestinal: Negative.     Genitourinary: Negative.   Musculoskeletal: Negative.   Skin: Negative.   Neurological: Negative.   Endo/Heme/Allergies: Negative.   Psychiatric/Behavioral: Negative.     Past medical history:  Past Medical History:  Diagnosis Date  . Allergic rhinitis, cause unspecified   . Anemia   . Asthma   . Atrial septal defect    s/p repair;   Echo 08/07/10: EF 55-60%; mild MR; atrial septal aneurysm; no residual ASD  . Chest pain, unspecified    cardiac cath 07/31/10: normal coronaries; vigorous LVF  . Esophageal reflux   . Fibromyalgia   . Headache(784.0)   . Hematuria, unspecified   . Hypertension    no meds   . Hypothyroidism   . Irritable bowel syndrome   . Low back pain   . Rheumatoid arthritis(714.0)    Dr Herold Harms  . Scoliosis   . Sinoatrial node dysfunction (HCC)    eval. for chronotropic competence completed in past    Past surgical history:  Past Surgical History:  Procedure Laterality Date  . ASD REPAIR  1985  . TONSILLECTOMY AND ADENOIDECTOMY    . TUBAL LIGATION      Family history: Family History  Problem Relation Age of Onset  . Hypertension Mother   . Diabetes Mother   . Heart disease Maternal Grandmother   . Stomach cancer Maternal Grandmother 95  . Rheum arthritis Maternal Grandmother   . Glaucoma Father   . Breast cancer Other  aunt  . Diabetes Other        GM  . Hypertension Sister   . Thyroid disease Sister   . Hypertension Sister   . Thyroid disease Sister   . Asthma Son   . Eczema Son   . Sinusitis Daughter   . Colon cancer Neg Hx   . Allergic rhinitis Neg Hx   . Angioedema Neg Hx   . Immunodeficiency Neg Hx     Social history: Social History   Socioeconomic History  . Marital status: Single    Spouse name: Not on file  . Number of children: 3  . Years of education: Not on file  . Highest education level: Not on file  Occupational History  . Occupation: HR assistant   Tobacco Use  . Smoking status: Never Smoker  .  Smokeless tobacco: Never Used  . Tobacco comment: never used tobacco  Substance and Sexual Activity  . Alcohol use: No  . Drug use: No  . Sexual activity: Not Currently    Birth control/protection: None, Surgical  Other Topics Concern  . Not on file  Social History Narrative   Household-- pt , her mother and her youngest son    Social Determinants of Radio broadcast assistant Strain:   . Difficulty of Paying Living Expenses:   Food Insecurity:   . Worried About Charity fundraiser in the Last Year:   . Arboriculturist in the Last Year:   Transportation Needs:   . Film/video editor (Medical):   Marland Kitchen Lack of Transportation (Non-Medical):   Physical Activity:   . Days of Exercise per Week:   . Minutes of Exercise per Session:   Stress:   . Feeling of Stress :   Social Connections:   . Frequency of Communication with Friends and Family:   . Frequency of Social Gatherings with Friends and Family:   . Attends Religious Services:   . Active Member of Clubs or Organizations:   . Attends Archivist Meetings:   Marland Kitchen Marital Status:   Intimate Partner Violence:   . Fear of Current or Ex-Partner:   . Emotionally Abused:   Marland Kitchen Physically Abused:   . Sexually Abused:     Environmental History: The patient lives in a 56 year old house with carpeting the bedroom, gassy, and central air.  There is no known mold/water damage in the home.  There are no pets in the home.  She is a non-smoker.  Current Outpatient Medications  Medication Sig Dispense Refill  . albuterol (PROAIR HFA) 108 (90 Base) MCG/ACT inhaler Inhale 2 puffs into the lungs every 6 (six) hours as needed. For shortness of breath and wheezing 1 Inhaler 6  . amLODipine (NORVASC) 5 MG tablet Take 1 tablet (5 mg total) by mouth daily. 90 tablet 3  . azelastine (ASTELIN) 0.1 % nasal spray 1-2 sprays per nostril twice daily as needed for runny nose. 30 mL 5  . fluticasone (FLONASE) 50 MCG/ACT nasal spray Place 2 sprays  into both nostrils daily as needed. 16 g 6  . Ginger, Zingiber officinalis, (GINGER ROOT PO) Take by mouth.    . Misc Natural Products (TART CHERRY ADVANCED PO) Take by mouth.    . prednisoLONE acetate (PRED FORTE) 1 % ophthalmic suspension Place 1 drop into both eyes daily.     Marland Kitchen Spacer/Aero-Holding Chambers (AEROCHAMBER MV) inhaler Use as instructed 1 each 0  . TURMERIC PO Take by mouth.    Marland Kitchen  fluocinonide (LIDEX) 0.05 % external solution APPLY TO SCALP TWICE A DAY AS DIRECTED    . Fluticasone Propionate (XHANCE) 93 MCG/ACT EXHU 2 sprays per nostril twice daily. 32 mL 5  . levocetirizine (XYZAL) 5 MG tablet Take 1 tablet once daily as needed for runny nose or itchy eyes 30 tablet 5  . Olopatadine HCl (PATADAY) 0.2 % SOLN 1 drop each eye once daily if needed for itchy eyes. 2.5 mL 5   No current facility-administered medications for this visit.    Known medication allergies: Allergies  Allergen Reactions  . Etanercept   . Methotrexate Derivatives     Caused her WBC to drop  . Pregabalin     I appreciate the opportunity to take part in Jasiya's care. Please do not hesitate to contact me with questions.  Sincerely,   R. Edgar Frisk, MD

## 2019-09-14 NOTE — Assessment & Plan Note (Addendum)
   Aeroallergen avoidance measures have been discussed and provided in written form.  A prescription has been provided for levocetirizine (Xyzal), 5 mg daily as needed.  To avoid diminishing benefit with daily use (tachyphylaxis) of second generation antihistamine, consider alternating every few months between fexofenadine (Allegra) and levocetirizine (Xyzal).  A prescription has been provided for [facility], 2 actuations per nostril twice a day as needed. Proper technique has been discussed and demonstrated.  Continue azelastine nasal spray, 1 to 2 sprays per nostril twice daily as needed.  Nasal saline spray (i.e., Simply Saline) or nasal saline lavage (i.e., NeilMed) is recommended as needed and prior to medicated nasal sprays.  If allergen avoidance measures and medications fail to adequately relieve symptoms, aeroallergen immunotherapy will be considered.

## 2019-09-15 NOTE — Progress Notes (Signed)
EXP 09/17/20

## 2019-09-18 ENCOUNTER — Ambulatory Visit: Payer: BC Managed Care – PPO | Admitting: Family Medicine

## 2019-09-18 ENCOUNTER — Encounter: Payer: Self-pay | Admitting: Family Medicine

## 2019-09-18 ENCOUNTER — Other Ambulatory Visit: Payer: Self-pay

## 2019-09-18 VITALS — BP 120/70 | HR 55 | Temp 95.2°F | Ht 68.0 in | Wt 127.1 lb

## 2019-09-18 DIAGNOSIS — R1314 Dysphagia, pharyngoesophageal phase: Secondary | ICD-10-CM

## 2019-09-18 NOTE — Patient Instructions (Signed)
If you do not hear anything about your referral in the next 1-2 days, call our office and ask for an update.  Let us know if you need anything.

## 2019-09-18 NOTE — Progress Notes (Signed)
Chief Complaint  Patient presents with  . problems with swallowing    Subjective: Patient is a 54 y.o. female here for difficulty swallowing.  For the past 2 weeks, the patient has felt like food is getting stuck in her upper chest and lower neck region.  She had a colonoscopy several weeks ago and her provider said her esophagus is too close to her spine to stretch.  He did not provide any alternative solution.  She is eating less and losing a few pounds.  She is not having any nausea, vomiting, abdominal pain, unexplained weight loss otherwise, or stool changes.  No injury or radiation exposure to her neck.  Thyroid levels have been normal.  She feels her voice is changed a little bit as well.  She is not regurgitating food.  Past Medical History:  Diagnosis Date  . Allergic rhinitis, cause unspecified   . Anemia   . Asthma   . Atrial septal defect    s/p repair;   Echo 08/07/10: EF 55-60%; mild MR; atrial septal aneurysm; no residual ASD  . Chest pain, unspecified    cardiac cath 07/31/10: normal coronaries; vigorous LVF  . Esophageal reflux   . Fibromyalgia   . Headache(784.0)   . Hematuria, unspecified   . Hypertension    no meds   . Hypothyroidism   . Irritable bowel syndrome   . Low back pain   . Rheumatoid arthritis(714.0)    Dr Herold Harms  . Scoliosis   . Sinoatrial node dysfunction (HCC)    eval. for chronotropic competence completed in past    Objective: BP 120/70 (BP Location: Left Arm, Patient Position: Sitting, Cuff Size: Normal)   Pulse (!) 55   Temp (!) 95.2 F (35.1 C) (Temporal)   Ht 5\' 8"  (1.727 m)   Wt 127 lb 2 oz (57.7 kg)   LMP 05/01/2016   SpO2 99%   BMI 19.33 kg/m  General: Awake, appears stated age HEENT: MMM, EOMi Neck: Right-sided deviation of the trachea; I appreciate no thyromegaly Heart: RRR Lungs: CTAB, no rales, wheezes or rhonchi. No accessory muscle use Psych: Age appropriate judgment and insight, normal affect and mood  Assessment  and Plan: Pharyngoesophageal dysphagia - Plan: Ambulatory referral to Gastroenterology  Urgent referral to [facility] gastroenterology.  If they are unable to dilate her esophagus, I will obtain a CT of her neck. The patient voiced understanding and agreement to the plan.  [place], DO 09/18/19  1:51 PM

## 2019-10-05 ENCOUNTER — Telehealth: Payer: Self-pay | Admitting: Gastroenterology

## 2019-10-05 LAB — HM MAMMOGRAPHY

## 2019-10-05 NOTE — Telephone Encounter (Signed)
Hey Dr. Ardis Hughs- patient is being referred to [place] for problems with her throat. She had a colonoscopy and endoscopy back in the beginning of the year and is requesting to switch care to [place]. She states that the GI she was at did the endo and did not offer her anything to help her throat after still having problems. She states that she has heard good things about our office and wanted to come here. I have her records- you are the DOD from when we received the referral (5/4 AM). Please advise on scheduling. Thank you!

## 2019-10-10 ENCOUNTER — Encounter: Payer: Self-pay | Admitting: Internal Medicine

## 2019-10-10 ENCOUNTER — Encounter: Payer: Self-pay | Admitting: Gastroenterology

## 2019-10-10 NOTE — Telephone Encounter (Signed)
Patient has an appointment with Dr. Ardis Hughs on 6/29.

## 2019-10-10 NOTE — Telephone Encounter (Signed)
EGD February 2021 Dr. Ernst Spell drain in [facility].  Indication dysphagia.  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  No dilation was done.  A few whitish spots seen in the lower esophagus, several biopsies done to rule out Candida."  Biopsies were all normal.  Colonoscopy February 2021 Dr. Ernst Spell drain in [facility].  Indication colon cancer screening.  Findings hemorrhoids.  The examination was otherwise normal including terminal ileal intubation.  She was recommended to have recall colonoscopy 10 years later.   I am happy to see her as a new patient.  Please offer her my first available new office appointment do not book with extender and do not double book.  Thank you

## 2019-11-14 ENCOUNTER — Ambulatory Visit: Payer: BC Managed Care – PPO | Admitting: Gastroenterology

## 2019-11-14 ENCOUNTER — Encounter: Payer: Self-pay | Admitting: Gastroenterology

## 2019-11-14 VITALS — BP 106/72 | HR 62 | Ht 69.0 in | Wt 133.1 lb

## 2019-11-14 DIAGNOSIS — R1310 Dysphagia, unspecified: Secondary | ICD-10-CM | POA: Diagnosis not present

## 2019-11-14 DIAGNOSIS — R1319 Other dysphagia: Secondary | ICD-10-CM

## 2019-11-14 NOTE — Progress Notes (Signed)
HPI: This is a very pleasant 54 year old woman who was referred to me by Shelda Pal*  to evaluate dysphagia.    She has had dysphagia for 5 or 6 years to both solids and liquids.  Seems to be gradually getting worse.  She has had extensive esophageal evaluation 5 or 6 years ago and again more recently in [facility].  See all those results summarized below.  It seems to point to cervical spine related extrinsic compression of the proximal esophagus.  She was referred here to consider esophageal dilation by her PCP  Her weight is overall stable.  She tells me that pills, liquids, solids will intermittently get caught.  She will sometimes have to regurgitate them.  Usually they just go down very slowly after she drinks more liquids or water.  Old Data Reviewed: EGD February 2021 Dr. Berna Bue in [facility].  Indication dysphagia.  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  No dilation was done.  A few whitish spots seen in the lower esophagus, several biopsies done to rule out Candida."  Biopsies were all normal.  Colonoscopy February 2021 Dr. Berna Bue in [facility].  Indication colon cancer screening.  Findings hemorrhoids.  The examination was otherwise normal including terminal ileal intubation.  She was recommended to have recall colonoscopy 10 years later.  From a February 2021 office note from her gastroenterologist at [facility] it looks like she had a "work-up in the past that revealed a narrowed proximal esophagus, and EUS was normal except for possible cervical spine compression."  I was able to see a 2015 note from a gastroenterologist at [facility].  They discussed and EUS for "extrinsic esophageal compression"  I was able to read a note from gastroenterologist Dr. Ivin Booty at [facility] October 2015 in which they reported an EGD from September 2015 revealed  "severe extrinsic compression of the proximal to mid esophagus" they referred her to [facility].  It looks like she had biopsies of her esophagus during the 2015 EGD that showed no evidence of eosinophilic esophagitis.     Review of systems: Pertinent positive and negative review of systems were noted in the above HPI section. All other review negative.   Past Medical History:  Diagnosis Date  . Allergic rhinitis, cause unspecified   . Anemia   . Asthma   . Atrial septal defect    s/p repair;   Echo 08/07/10: EF 55-60%; mild MR; atrial septal aneurysm; no residual ASD  . Chest pain, unspecified    cardiac cath 07/31/10: normal coronaries; vigorous LVF  . Esophageal reflux   . Fibromyalgia   . Headache(784.0)   . Hematuria, unspecified   . Hypertension    no meds   . Hypothyroidism   . Irritable bowel syndrome   . Low back pain   . Rheumatoid arthritis(714.0)    Dr Herold Harms  . Scoliosis   . Sinoatrial node dysfunction (HCC)    eval. for chronotropic competence completed in past    Past Surgical History:  Procedure Laterality Date  . ASD REPAIR  1985  . TONSILLECTOMY AND ADENOIDECTOMY    . TUBAL LIGATION      Current Outpatient Medications  Medication Sig Dispense Refill  . albuterol (PROAIR HFA) 108 (90 Base) MCG/ACT inhaler Inhale 2 puffs into the lungs every 6 (six) hours as needed. For shortness of breath and wheezing 1 Inhaler 6  .  amLODipine (NORVASC) 5 MG tablet Take 1 tablet (5 mg total) by mouth daily. 90 tablet 3  . azelastine (ASTELIN) 0.1 % nasal spray 1-2 sprays per nostril twice daily as needed for runny nose. 30 mL 5  . fluticasone (FLONASE) 50 MCG/ACT nasal spray Place 2 sprays into both nostrils daily as needed. 16 g 6  . Fluticasone Propionate (XHANCE) 93 MCG/ACT EXHU 2 sprays per nostril twice daily. 32 mL 5  . Ginger, Zingiber officinalis, (GINGER ROOT PO) Take by mouth.    . Misc Natural Products (TART CHERRY ADVANCED PO) Take by mouth.    . prednisoLONE  acetate (PRED FORTE) 1 % ophthalmic suspension Place 1 drop into both eyes daily.     Marland Kitchen Spacer/Aero-Holding Chambers (AEROCHAMBER MV) inhaler Use as instructed 1 each 0  . TURMERIC PO Take by mouth.     No current facility-administered medications for this visit.    Allergies as of 11/14/2019 - Review Complete 11/14/2019  Allergen Reaction Noted  . Etanercept  03/09/2014  . Methotrexate derivatives  03/21/2013  . Pregabalin  05/10/2014    Family History  Problem Relation Age of Onset  . Hypertension Mother   . Diabetes Mother   . Heart disease Maternal Grandmother   . Stomach cancer Maternal Grandmother 95  . Rheum arthritis Maternal Grandmother   . Glaucoma Father   . Breast cancer Other        aunt  . Diabetes Other        GM  . Hypertension Sister   . Thyroid disease Sister   . Hypertension Sister   . Thyroid disease Sister   . Asthma Son   . Eczema Son   . Sinusitis Daughter   . Colon cancer Neg Hx   . Allergic rhinitis Neg Hx   . Angioedema Neg Hx   . Immunodeficiency Neg Hx     Social History   Socioeconomic History  . Marital status: Single    Spouse name: Not on file  . Number of children: 3  . Years of education: Not on file  . Highest education level: Not on file  Occupational History  . Occupation: HR assistant   Tobacco Use  . Smoking status: Never Smoker  . Smokeless tobacco: Never Used  . Tobacco comment: never used tobacco  Vaping Use  . Vaping Use: Never used  Substance and Sexual Activity  . Alcohol use: No  . Drug use: No  . Sexual activity: Not Currently    Birth control/protection: None, Surgical  Other Topics Concern  . Not on file  Social History Narrative   Household-- pt , her mother and her youngest son    Social Determinants of Radio broadcast assistant Strain:   . Difficulty of Paying Living Expenses:   Food Insecurity:   . Worried About Charity fundraiser in the Last Year:   . Arboriculturist in the Last Year:    Transportation Needs:   . Film/video editor (Medical):   Marland Kitchen Lack of Transportation (Non-Medical):   Physical Activity:   . Days of Exercise per Week:   . Minutes of Exercise per Session:   Stress:   . Feeling of Stress :   Social Connections:   . Frequency of Communication with Friends and Family:   . Frequency of Social Gatherings with Friends and Family:   . Attends Religious Services:   . Active Member of Clubs or Organizations:   . Attends Club or  Organization Meetings:   Marland Kitchen Marital Status:   Intimate Partner Violence:   . Fear of Current or Ex-Partner:   . Emotionally Abused:   Marland Kitchen Physically Abused:   . Sexually Abused:      Physical Exam: BP 106/72   Pulse 62   Ht 5\' 9"  (1.753 m)   Wt 133 lb 2 oz (60.4 kg)   LMP 05/01/2016   BMI 19.66 kg/m  Constitutional: generally well-appearing Psychiatric: alert and oriented x3 Eyes: extraocular movements intact Mouth: oral pharynx moist, no lesions Neck: supple no lymphadenopathy Cardiovascular: heart regular rate and rhythm Lungs: clear to auscultation bilaterally Abdomen: soft, nontender, nondistended, no obvious ascites, no peritoneal signs, normal bowel sounds Extremities: no lower extremity edema bilaterally Skin: no lesions on visible extremities   Assessment and plan: 54 y.o. female with dysphagia  All the work-up several years ago and also more recently point because cervical spine causing extrinsic related compression on the esophagus.  If this is indeed true there really are no endoscopic treatments for this and she will have to simply taking great care to eat slowly, chew her food well, take small bites and to drink a lot of liquids when she eats.  If things get more progressive then certainly feeding tube could be entertained. I recommended further testing to make sure that that is immediately feeling with.  She will get a modified barium swallow study as well as a barium esophagram.  Please see the "Patient  Instructions" section for addition details about the plan.   Owens Loffler, MD [facility] 11/14/2019, 2:59 PM  Cc: Shelda Pal*  Total time on date of encounter was 45 minutes (this included time spent preparing to see the patient reviewing records; obtaining and/or reviewing separately obtained history; performing a medically appropriate exam and/or evaluation; counseling and educating the patient and family if present; ordering medications, tests or procedures if applicable; and documenting clinical information in the health record).

## 2019-11-14 NOTE — Patient Instructions (Signed)
If you are age 56 or older, your body mass index should be between 23-30. Your Body mass index is 19.66 kg/m. If this is out of the aforementioned range listed, please consider follow up with your Primary Care Provider.  If you are age 4 or younger, your body mass index should be between 19-25. Your Body mass index is 19.66 kg/m. If this is out of the aformentioned range listed, please consider follow up with your Primary Care Provider.   You will be scheduled for a modified barium swallow study.  We will contact you with appointment date and time once it has been scheduled.  _________________ A Modified Barium Swallow Study, or MBS, is a special x-ray that is taken to check swallowing skills. It is carried out by a Stage manager and a Psychologist, clinical (SLP). During this test, yourmouth, throat, and esophagus, a muscular tube which connects your mouth to your stomach, is checked. The test will help you, your doctor, and the SLP plan what types of foods and liquids are easier for you to swallow. The SLP will also identify positions and ways to help you swallow more easily and safely. What will happen during an MBS? You will be taken to an x-ray room and seated comfortably. You will be asked to swallow small amounts of food and liquid mixed with barium. Barium is a liquid or paste that allows images of your mouth, throat and esophagus to be seen on x-ray. The x-ray captures moving images of the food you are swallowing as it travels from your mouth through your throat and into your esophagus. This test helps identify whether food or liquid is entering your lungs (aspiration). The test also shows which part of your mouth or throat lacks strength or coordination to move the food or liquid in the right direction. This test typically takes 30 minutes to 1 hour to complete. __________________________________________________________  Dennis Bast have been scheduled for a Barium Esophogram at [facility] on  11/21/19 at 9:00am. Please arrive 15 minutes prior to your appointment for registration. Make certain not to have anything to eat or drink 3 hours prior to your test. If you need to reschedule for any reason, please contact radiology at [phone] to do so. __________________________________________________________ A barium swallow is an examination that concentrates on views of the esophagus. This tends to be a double contrast exam (barium and two liquids which, when combined, create a gas to distend the wall of the oesophagus) or single contrast (non-ionic iodine based). The study is usually tailored to your symptoms so a good history is essential. Attention is paid during the study to the form, structure and configuration of the esophagus, looking for functional disorders (such as aspiration, dysphagia, achalasia, motility and reflux)  EXAMINATION You may be asked to change into a gown, depending on the type of swallow being performed. A radiologist and radiographer will perform the procedure. The radiologist will advise you of the type of contrast selected for your procedure and direct you during the exam. You will be asked to stand, sit or lie in several different positions and to hold a small amount of fluid in your mouth before being asked to swallow while the imaging is performed .In some instances you may be asked to swallow barium coated marshmallows to assess the motility of a solid food bolus. The exam can be recorded as a digital or video fluoroscopy procedure.  POST PROCEDURE It will take 1-2 days for the barium to pass through your system.  To facilitate this, it is important, unless otherwise directed, to increase your fluids for the next 24-48hrs and to resume your normal diet.  This test typically takes about 30 minutes to perform. __________________________________________________________  It is suspected that your cervical spine bones are pushing on your esophagus.  Thank you for  entrusting me with your care and choosing [facility].  Dr Ardis Hughs

## 2019-11-15 ENCOUNTER — Telehealth: Payer: Self-pay | Admitting: Gastroenterology

## 2019-11-15 NOTE — Telephone Encounter (Signed)
Patient called to see if Barium esophogram could be rescheduled to July 9th.  I called to Central scheduling and there was no available times on that day.  I also called [phone] to schedule modified barium swallow with speech therapy, but there was no answer.  I left message for them to return call.  I will notify patient of date and time once scheduled.  Patient is aware.

## 2019-11-15 NOTE — Telephone Encounter (Signed)
Patient called in reference to scheduling a swallowing test.

## 2019-11-16 ENCOUNTER — Other Ambulatory Visit (HOSPITAL_COMMUNITY): Payer: Self-pay

## 2019-11-16 DIAGNOSIS — R1310 Dysphagia, unspecified: Secondary | ICD-10-CM

## 2019-11-16 NOTE — Telephone Encounter (Signed)
Patient aware that she is scheduled for modified barium swallow with speech pathology on 11-21-19 at 11:30am at [facility].  Patient advised to remain NPO after barium esophogram.  Patient agreed to plan and verbalized understanding.  No further questions.

## 2019-11-21 ENCOUNTER — Ambulatory Visit (HOSPITAL_COMMUNITY)
Admission: RE | Admit: 2019-11-21 | Discharge: 2019-11-21 | Disposition: A | Discharge status: Home / Self Care | Payer: BC Managed Care – PPO | Source: Ambulatory Visit | Attending: Gastroenterology | Admitting: Gastroenterology

## 2019-11-21 ENCOUNTER — Encounter (HOSPITAL_COMMUNITY): Payer: Self-pay

## 2019-11-21 ENCOUNTER — Other Ambulatory Visit: Payer: Self-pay

## 2019-11-21 DIAGNOSIS — R1319 Other dysphagia: Secondary | ICD-10-CM

## 2019-11-21 DIAGNOSIS — R1310 Dysphagia, unspecified: Secondary | ICD-10-CM | POA: Insufficient documentation

## 2019-11-21 NOTE — Therapy (Signed)
Modified Barium Swallow Progress Note  Patient Details  Name: Brittany Hodges MRN: [id number] Date of Birth: 05/28/1965  Today's Date: 11/21/2019  Modified Barium Swallow completed.  Full report located under Chart Review in the Imaging Section.  Brief recommendations include the following:  Clinical Impression  Ms. Wheller demonstrates normal oropharyngeal swallow function. She had adequate strength/ROM of all structures for efficient swallow without penetration/aspiration or significant residue. Pt c/o globus sensation and points to sternal notch. She reports if she pushes here, it will occasionally improve the sensation. She denies any weight loss associated with her dysphagia. No food was noted to be static in oropharynx. She was noted with very mild (functional) slowness of esophageal clearance given solids. She denies any c/o heartburn or reflux. Esophageal phase being further investigated same date with esophagram. Pt educated on results and recommendations from this study, including continuing regular solids/thin liquids, alternating solids/liquids, and sitting upright/remaining upright 30 mins after PO intake. No further ST is indicated for her dysphagia at this time.   Swallow Evaluation Recommendations   SLP Diet Recommendations: Regular solids;Thin liquid   Liquid Administration via: Cup;Straw   Medication Administration: Whole meds with liquid   Supervision: Patient able to self feed   Compensations: Small sips/bites;Follow solids with liquid   Postural Changes: Seated upright at 90 degrees   Oral Care Recommendations: Oral care BID     Estus Krakowski P. Ramona Ruark, M.S., CCC-SLP Speech-Language Pathologist Acute Rehabilitation Services Pager: [place] 11/21/2019,10:51 AM

## 2019-11-23 ENCOUNTER — Other Ambulatory Visit: Payer: Self-pay

## 2019-11-23 MED ORDER — OMEPRAZOLE 40 MG PO CPDR
40.0000 mg | DELAYED_RELEASE_CAPSULE | Freq: Two times a day (BID) | ORAL | 6 refills | Status: DC
Start: 2019-11-23 — End: 2020-06-08

## 2019-12-16 ENCOUNTER — Ambulatory Visit: Payer: BC Managed Care – PPO | Attending: Internal Medicine

## 2019-12-16 DIAGNOSIS — Z23 Encounter for immunization: Secondary | ICD-10-CM

## 2019-12-16 NOTE — Progress Notes (Signed)
   Covid-19 Vaccination Clinic  Name:  Brittany Hodges    MRN: [id number] DOB: [birth year]  12/16/2019  [name] was observed post Covid-19 immunization for 15 minutes without incident. She was provided with Vaccine Information Sheet and instruction to access the V-Safe system.   [name] was instructed to call 911 with any severe reactions post vaccine: Marland Kitchen Difficulty breathing  . Swelling of face and throat  . A fast heartbeat  . A bad rash all over body  . Dizziness and weakness   Immunizations Administered    Name Date Dose VIS Date Route   Pfizer COVID-19 Vaccine 12/16/2019  8:45 AM 0.3 mL 07/12/2018 Intramuscular   Manufacturer: [employer], [employer]   Lot: [id number]   [place]: [id number]

## 2019-12-23 ENCOUNTER — Other Ambulatory Visit: Payer: Self-pay | Admitting: Internal Medicine

## 2020-01-16 ENCOUNTER — Ambulatory Visit: Payer: BC Managed Care – PPO | Attending: Internal Medicine

## 2020-01-16 DIAGNOSIS — Z23 Encounter for immunization: Secondary | ICD-10-CM

## 2020-01-16 NOTE — Progress Notes (Signed)
   Covid-19 Vaccination Clinic  Name:  Brittany Hodges    MRN: [id number] DOB: July 01, 1965  01/16/2020  Brittany Hodges was observed post Covid-19 immunization for 30 minutes based on pre-vaccination screening without incident. She was provided with Vaccine Information Sheet and instruction to access the V-Safe system.   Brittany Hodges was instructed to call 911 with any severe reactions post vaccine: Marland Kitchen Difficulty breathing  . Swelling of face and throat  . A fast heartbeat  . A bad rash all over body  . Dizziness and weakness   Immunizations Administered    Name Date Dose VIS Date Route   Pfizer COVID-19 Vaccine 01/16/2020  4:23 PM 0.3 mL 07/12/2018 Intramuscular   Manufacturer: [place]   Lot: [id number]   [place]: [id number]

## 2020-02-14 NOTE — Progress Notes (Signed)
Office Visit Note  Patient: Brittany Hodges             Date of Birth: [birth year]           MRN: [id number]             PCP: Colon Branch, MD Referring: Colon Branch, MD Visit Date: 02/15/2020 Occupation: @GUAROCC @  Subjective:  Low back pain  History of Present Illness: Brittany Hodges is a 54 y.o. female with history of seropositive rheumatoid arthritis, osteoarthritis, and iritis. She is not taking any immunosuppressive agents at this time. She denies any recent rheumatoid arthritis flares. She is not having any joint pain, joint swelling, morning stiffness, nocturnal pain. She has no difficulty with ADLs. According to the patient about 2 months ago she was assisting her mother and felt a "shift" in her lower back. She denies any radiating pain or numbness at this time. She states that she has an upcoming appointment with a chiropractor today to have x-rays and an adjustment. She is also been noticing crepitus in her neck at times. She tries to perform neck exercises on a daily basis. She has been exercising every other day and plans on starting to do yoga on a daily basis. She reports that she has had less frequent flares of iritis recently. She states that her last flare was about a week ago and was very mild in her right eye. She has not needed to use prednisone drops recently. She continues to see her ophthalmologist every 3 months. She states that since starting to take [place] she has noticed less flares of iritis. She does not want to start on Humira at this time. She did request to have her lab work drawn today.    Activities of Daily Living:  Patient reports morning stiffness for 0  minutes.   Patient Denies nocturnal pain.  Difficulty dressing/grooming: Denies Difficulty climbing stairs: Denies Difficulty getting out of chair: Denies Difficulty using hands for taps, buttons, cutlery, and/or writing: Denies  Review of Systems  Constitutional: Negative for fatigue.    HENT: Negative for mouth sores, mouth dryness and nose dryness.   Eyes: Positive for redness and dryness. Negative for pain and visual disturbance.  Respiratory: Negative for cough, hemoptysis, shortness of breath and difficulty breathing.   Cardiovascular: Negative for chest pain, palpitations, hypertension and swelling in legs/feet.  Gastrointestinal: Negative for blood in stool, constipation and diarrhea.  Endocrine: Negative for increased urination.  Genitourinary: Negative for painful urination.  Musculoskeletal: Positive for arthralgias and joint pain. Negative for joint swelling, myalgias, muscle weakness, morning stiffness, muscle tenderness and myalgias.  Skin: Negative for color change, pallor, rash, hair loss, nodules/bumps, skin tightness, ulcers and sensitivity to sunlight.  Allergic/Immunologic: Negative for susceptible to infections.  Neurological: Negative for dizziness, numbness, headaches and weakness.  Hematological: Negative for swollen glands.  Psychiatric/Behavioral: Negative for depressed mood and sleep disturbance. The patient is not nervous/anxious.     PMFS History:  Patient Active Problem List   Diagnosis Date Noted  . Allergic conjunctivitis 09/14/2019  . Chronic sinusitis 09/14/2019  . Intolerance, food 09/14/2019  . Asthmatic bronchitis 04/06/2016  . Laryngopharyngeal reflux (LPR) 04/06/2016  . PCP NOTES >>>>> 03/09/2015  . Annual physical exam 12/21/2014  . Cardiomegaly, by MRI 03/16/2014  . Leukopenia 03/10/2014  . Lightheadedness 02/10/2011  . SINOATRIAL NODE DYSFUNCTION 08/06/2008  . ATRIAL SEPTAL DEFECT 06/29/2007  . Hypothyroidism 03/21/2007  . Essential hypertension 03/21/2007  . Perennial allergic rhinitis 03/21/2007  .  GERD-- dysphagia 03/21/2007  . CONSTIPATION 03/21/2007  . IRRITABLE BOWEL SYNDROME 03/21/2007  . RA (rheumatoid arthritis) ([place]) 03/21/2007  . LOW BACK PAIN 03/21/2007  . FIBROMYALGIA 03/21/2007    Past Medical History:   Diagnosis Date  . Allergic rhinitis, cause unspecified   . Anemia   . Asthma   . Atrial septal defect    s/p repair;   Echo 08/07/10: EF 55-60%; mild MR; atrial septal aneurysm; no residual ASD  . Chest pain, unspecified    cardiac cath 07/31/10: normal coronaries; vigorous LVF  . Esophageal reflux   . Fibromyalgia   . Headache(784.0)   . Hematuria, unspecified   . Hypertension    no meds   . Hypothyroidism   . Irritable bowel syndrome   . Low back pain   . Rheumatoid arthritis(714.0)    Dr Herold Harms  . Scoliosis   . Sinoatrial node dysfunction (HCC)    eval. for chronotropic competence completed in past    Family History  Problem Relation Age of Onset  . Hypertension Mother   . Diabetes Mother   . Heart disease Maternal Grandmother   . Stomach cancer Maternal Grandmother 95  . Rheum arthritis Maternal Grandmother   . Glaucoma Father   . Breast cancer Other        aunt  . Diabetes Other        GM  . Hypertension Sister   . Thyroid disease Sister   . Hypertension Sister   . Thyroid disease Sister   . Asthma Son   . Eczema Son   . Sinusitis Daughter   . Colon cancer Neg Hx   . Allergic rhinitis Neg Hx   . Angioedema Neg Hx   . Immunodeficiency Neg Hx    Past Surgical History:  Procedure Laterality Date  . ASD REPAIR  1985  . TONSILLECTOMY AND ADENOIDECTOMY    . TUBAL LIGATION     Social History   Social History Narrative   Household-- pt , her mother and her youngest son    Immunization History  Administered Date(s) Administered  . Influenza Split 03/17/2011, 03/17/2012  . Influenza Whole 04/05/2009, 02/21/2010  . Influenza,inj,Quad PF,6+ Mos 03/21/2013, 03/09/2014, 05/24/2015, 01/27/2016  . PFIZER SARS-COV-2 Vaccination 12/16/2019, 01/16/2020  . Pneumococcal Conjugate-13 01/27/2016  . Pneumococcal Polysaccharide-23 03/08/2015  . Td 07/23/2010     Objective: Vital Signs: BP (!) 137/91 (BP Location: Right Arm, Patient Position: Sitting, Cuff Size:  Normal)   Pulse (!) 50   Resp 15   Ht 5\' 9"  (1.753 m)   Wt 129 lb 3.2 oz (58.6 kg)   LMP 05/01/2016   BMI 19.08 kg/m    Physical Exam Vitals and nursing note reviewed.  Constitutional:      Appearance: She is well-developed.  HENT:     Head: Normocephalic and atraumatic.  Eyes:     Conjunctiva/sclera: Conjunctivae normal.  Pulmonary:     Effort: Pulmonary effort is normal.  Abdominal:     Palpations: Abdomen is soft.  Musculoskeletal:     Cervical back: Normal range of motion.  Skin:    General: Skin is warm and dry.     Capillary Refill: Capillary refill takes less than 2 seconds.  Neurological:     Mental Status: She is alert and oriented to person, place, and time.  Psychiatric:        Behavior: Behavior normal.      Musculoskeletal Exam: C-spine, thoracic spine, and lumbar spine good ROM.  Mild midline  spinal tenderness in the lumbar region.. No SI joint tenderness. Shoulder joints, elbow joints, wrist joints, MCPs, PIPs, and DIPs good ROM with no synovitis.  Complete fist formation bilaterally.  Hip joints, knee joints, ankle joints, and MTP joints have good ROM with no discomfort.  No warmth or effusion of knee joints.  No tenderness or swelling of ankle joints.  No tenderness of MTP joints.   CDAI Exam: CDAI Score: -- Patient Global: --; Provider Global: -- Swollen: --; Tender: -- Joint Exam 02/15/2020   No joint exam has been documented for this visit   There is currently no information documented on the homunculus. Go to the Rheumatology activity and complete the homunculus joint exam.  Investigation: No additional findings.  Imaging: No results found.  Recent Labs: Lab Results  Component Value Date   WBC 2.3 (L) 06/13/2019   HGB 11.9 06/13/2019   PLT 154 06/13/2019   NA 139 06/13/2019   K 4.0 06/13/2019   CL 104 06/13/2019   CO2 27 06/13/2019   GLUCOSE 98 06/13/2019   BUN 10 06/13/2019   CREATININE 0.83 06/13/2019   BILITOT 0.5 06/13/2019    ALKPHOS 67 06/08/2019   AST 22 06/13/2019   ALT 11 06/13/2019   PROT 7.5 06/13/2019   ALBUMIN 4.2 06/08/2019   CALCIUM 9.8 06/13/2019   GFRAA 92 06/13/2019   QFTBGOLDPLUS NEGATIVE 06/23/2018    Speciality Comments: Dexa- 05/27/18 Osteopenia T-Score -1.7 BMD 0.963  Prior therapy includes: Plaquenil (inadequate response) , Enbrel (injection site reaction) and methotrexate (neutropenia so on low dose).  Procedures:  No procedures performed Allergies: Etanercept, Methotrexate derivatives, and Pregabalin   Assessment / Plan:     Visit Diagnoses: Rheumatoid arthritis involving multiple sites with positive rheumatoid factor +CCP ([place]): She has not joint tenderness or synovitis on exam. She has not had any recent rheumatoid arthritis flares. She is not experiencing any joint pain, joint swelling, morning stiffness, or nocturnal pain. She is not taking any immunosuppressive agents at this time. The plan was to start her on Humira after her office visit in January 2021 due to recurrent iritis flares but she does not want to start on Humira at this time. She was advised to notify us if she develops increased joint pain or joint swelling or more recurrent iritis flares. She will follow-up in the office in 5 months.  Iritis - Dr. Teodoro Spray, Recurrent iritis: According to the patient she has had less frequent flares of iritis. She has not needed to use prednisone drops recently. She had a mild brief flare in her right eye last week which has since resolved. She has been experiencing increased eye dryness and is using eyedrops on a regular basis. After her last office visit in January 2021 the plan was to start her on Humira for management of the recurrent flares. She did not return for follow-up at that time. She does not want to start on Humira at this time. She was strongly encouraged to consider starting on Humira if she continues to have recurrent iritis flares. She will notify us if her symptoms  persist or worsen. She was encouraged to continue to see her ophthalmologist every 3 months.  High risk medication use -She does not want to take any immunosuppressive agents at this time. The plan was to originally start her on Humira but she has declined. She discontinued methotrexate in January 2020 due to neutropenia and recurrent iritis flares. She requested to have CBC and  CMP drawn today.  -  Plan: CBC with Differential/Platelet, COMPLETE METABOLIC PANEL WITH GFR  Primary osteoarthritis of both hands: She is not experiencing any discomfort or stiffness in her hands at this time. She has no tenderness or inflammation. She is able to make a complete fist bilaterally.  Primary osteoarthritis of both feet: She is not experiencing any discomfort in her feet at this time. She wears proper fitting shoes. She has no tenderness of MTP or PIP joints. Ankle joints have good range of motion with no discomfort.  DDD (degenerative disc disease), cervical: She has good range of motion of the C-spine on exam with no discomfort. She experiences intermittent crepitus with range of motion. She is not experiencing any symptoms of radiculopathy. She has been performing neck exercises on a daily basis which has been helpful. She was advised to notify us if her neck pain persists or worsens.  Chronic midline low back pain without sciatica: She has been experiencing increased discomfort in her lower back for the past 2 months. According to the patient she was assisting her mother and felt a "shift" in her lower back. She has not noticed much improvement in her discomfort. She is not experiencing any symptoms of radiculopathy at this time. She has good range of motion on examination today. She has some midline spinal tenderness but no SI joint tenderness. Hip joints have good range of motion with no discomfort. She plans on establishing care with a chiropractor today to have x-rays as well as an adjustment. She was given a  handout of back exercises and core strengthening exercises to perform. She was advised to notify us if her lower back pain persists or worsens.  Other neutropenia ([place]): White blood cell count was 2.3 on 06/13/2019. We will repeat CBC with differential today. She does not want to start on any immunosuppressive agents at this time.  Pain in thoracic spine - The x-ray showed mild spondylosis and dextroscoliosis of the thoracic spine.  No midline spinal tenderness.   History of scoliosis: She will be establishing care with a chiropractor today.   Other medical conditions are listed as follows:   History of hypothyroidism  History of hypertension  History of gastroesophageal reflux (GERD)  History of bradycardia/ sinoatrial node dysfunction   History of migraine  History of IBS  Orders: Orders Placed This Encounter  Procedures  . CBC with Differential/Platelet  . COMPLETE METABOLIC PANEL WITH GFR   No orders of the defined types were placed in this encounter.     Follow-Up Instructions: Return in about 5 months (around 07/15/2020) for Rheumatoid arthritis, Osteoarthritis, iritis.   Ofilia Neas, PA-C  Note - This record has been created using Dragon software.  Chart creation errors have been sought, but may not always  have been located. Such creation errors do not reflect on  the standard of medical care.

## 2020-02-15 ENCOUNTER — Other Ambulatory Visit: Payer: Self-pay

## 2020-02-15 ENCOUNTER — Telehealth: Payer: Self-pay | Admitting: Rheumatology

## 2020-02-15 ENCOUNTER — Encounter: Payer: Self-pay | Admitting: Physician Assistant

## 2020-02-15 ENCOUNTER — Ambulatory Visit (INDEPENDENT_AMBULATORY_CARE_PROVIDER_SITE_OTHER): Payer: BC Managed Care – PPO | Admitting: Physician Assistant

## 2020-02-15 VITALS — BP 137/91 | HR 50 | Resp 15 | Ht 69.0 in | Wt 129.2 lb

## 2020-02-15 DIAGNOSIS — Z87898 Personal history of other specified conditions: Secondary | ICD-10-CM

## 2020-02-15 DIAGNOSIS — M19071 Primary osteoarthritis, right ankle and foot: Secondary | ICD-10-CM

## 2020-02-15 DIAGNOSIS — Z8739 Personal history of other diseases of the musculoskeletal system and connective tissue: Secondary | ICD-10-CM

## 2020-02-15 DIAGNOSIS — Z8669 Personal history of other diseases of the nervous system and sense organs: Secondary | ICD-10-CM

## 2020-02-15 DIAGNOSIS — D708 Other neutropenia: Secondary | ICD-10-CM

## 2020-02-15 DIAGNOSIS — M19042 Primary osteoarthritis, left hand: Secondary | ICD-10-CM

## 2020-02-15 DIAGNOSIS — Z8639 Personal history of other endocrine, nutritional and metabolic disease: Secondary | ICD-10-CM

## 2020-02-15 DIAGNOSIS — M5030 Other cervical disc degeneration, unspecified cervical region: Secondary | ICD-10-CM

## 2020-02-15 DIAGNOSIS — Z8719 Personal history of other diseases of the digestive system: Secondary | ICD-10-CM

## 2020-02-15 DIAGNOSIS — Z79899 Other long term (current) drug therapy: Secondary | ICD-10-CM | POA: Diagnosis not present

## 2020-02-15 DIAGNOSIS — M19072 Primary osteoarthritis, left ankle and foot: Secondary | ICD-10-CM

## 2020-02-15 DIAGNOSIS — M546 Pain in thoracic spine: Secondary | ICD-10-CM

## 2020-02-15 DIAGNOSIS — H209 Unspecified iridocyclitis: Secondary | ICD-10-CM

## 2020-02-15 DIAGNOSIS — M545 Low back pain: Secondary | ICD-10-CM

## 2020-02-15 DIAGNOSIS — G8929 Other chronic pain: Secondary | ICD-10-CM

## 2020-02-15 DIAGNOSIS — M19041 Primary osteoarthritis, right hand: Secondary | ICD-10-CM

## 2020-02-15 DIAGNOSIS — Z8679 Personal history of other diseases of the circulatory system: Secondary | ICD-10-CM

## 2020-02-15 DIAGNOSIS — M0579 Rheumatoid arthritis with rheumatoid factor of multiple sites without organ or systems involvement: Secondary | ICD-10-CM | POA: Diagnosis not present

## 2020-02-15 NOTE — Patient Instructions (Signed)
Core Strength Exercises  Core exercises help to build strength in the muscles between your ribs and your hips (abdominal muscles). These muscles help to support your body and keep your spine stable. It is important to maintain strength in your core to prevent injury and pain. Some activities, such as yoga and Pilates, can help to strengthen core muscles. You can also strengthen core muscles with exercises at home. It is important to talk to your health care provider before you start a new exercise routine. What are the benefits of core strength exercises? Core strength exercises can:  Reduce back pain.  Help to rebuild strength after a back or spine injury.  Help to prevent injury during physical activity, especially injuries to the back and knees. How to do core strength exercises Repeat these exercises 10-15 times, or until you are tired. Do exercises exactly as told by your health care provider and adjust them as directed. It is normal to feel mild stretching, pulling, tightness, or discomfort as you do these exercises. If you feel any pain while doing these exercises, stop. If your pain continues or gets worse when doing core exercises, contact your health care provider. You may want to use a padded yoga or exercise mat for strength exercises that are done on the floor. Bridging  1. Lie on your back on a firm surface with your knees bent and your feet flat on the floor. 2. Raise your hips so that your knees, hips, and shoulders form a straight line together. Keep your abdominal muscles tight. 3. Hold this position for 3-5 seconds. 4. Slowly lower your hips to the starting position. 5. Let your muscles relax completely between repetitions. Single-leg bridge 1. Lie on your back on a firm surface with your knees bent and your feet flat on the floor. 2. Raise your hips so that your knees, hips, and shoulders form a straight line together. Keep your abdominal muscles tight. 3. Lift one foot  off the floor, then completely straighten that leg. 4. Hold this position for 3-5 seconds. 5. Put the straight leg back down in the bent position. 6. Slowly lower your hips to the starting position. 7. Repeat these steps using your other leg. Side bridge 1. Lie on your side with your knees bent. Prop yourself up on the elbow that is near the floor. 2. Using your abdominal muscles and your elbow that is on the floor, raise your body off the floor. Raise your hip so that your shoulder, hip, and foot form a straight line together. 3. Hold this position for 10 seconds. Keep your head and neck raised and away from your shoulder (in their normal, neutral position). Keep your abdominal muscles tight. 4. Slowly lower your hip to the starting position. 5. Repeat and try to hold this position longer, working your way up to 30 seconds. Abdominal crunch 1. Lie on your back on a firm surface. Bend your knees and keep your feet flat on the floor. 2. Cross your arms over your chest. 3. Without bending your neck, tip your chin slightly toward your chest. 4. Tighten your abdominal muscles as you lift your chest just high enough to lift your shoulder blades off of the floor. Do not hold your breath. You can do this with short lifts or long lifts. 5. Slowly return to the starting position. Bird dog 1. Get on your hands and knees, with your legs shoulder-width apart and your arms under your shoulders. Keep your back straight. 2. Tighten  your abdominal muscles. 3. Raise one of your legs off the floor and straighten it. Try to keep it parallel to the floor. 4. Slowly lower your leg to the starting position. 5. Raise one of your arms off the floor and straighten it. Try to keep it parallel to the floor. 6. Slowly lower your arm to the starting position. 7. Repeat with the other arm and leg. If possible, try raising a leg and arm at the same time, on opposite sides of the body. For example, raise your left hand and  your right leg. Plank 1. Lie on your belly. 2. Prop up your body onto your forearms and your feet, keeping your legs straight. Your body should make a straight line between your shoulders and feet. 3. Hold this position for 10 seconds while keeping your abdominal muscles tight. 4. Lower your body to the starting position. 5. Repeat and try to hold this position longer, working your way up to 30 seconds. Cross-core strengthening 1. Stand with your feet shoulder-width apart. 2. Hold a ball out in front of you. Keep your arms straight. 3. Tighten your abdominal muscles and slowly rotate at your waist from side to side. Keep your feet flat. 4. Once you are comfortable, try repeating this exercise with a heavier ball. Top core strengthening 1. Stand about 18 inches (46 cm) in front of a wall, with your back to the wall. 2. Keep your feet flat and shoulder-width apart. 3. Tighten your abdominal muscles. 4. Bend your hips and knees. 5. Slowly reach between your legs to touch the wall behind you. 6. Slowly stand back up. 7. Raise your arms over your head and reach behind you. 8. Return to the starting position. General tips  Do not do any exercises that cause pain. If you have pain while exercising, talk to your health care provider.  Always stretch before and after doing these exercises. This can help prevent injury.  Maintain a healthy weight. Ask your health care provider what weight is healthy for you. Contact a health care provider if:  You have back pain that gets worse or does not go away.  You feel pain while doing core strength exercises. Get help right away if:  You have severe pain that does not get better with medicine. Summary  Core exercises help to build strength in the muscles between your ribs and your waist.  Core muscles help to support your body and keep your spine stable.  Some activities, such as yoga and Pilates, can help to strengthen core muscles.  Core  strength exercises can help back pain and can prevent injury.  If you feel any pain while doing core strength exercises, stop. This information is not intended to replace advice given to you by your health care provider. Make sure you discuss any questions you have with your health care provider. Document Revised: 08/24/2018 Document Reviewed: 09/23/2016 Elsevier Patient Education  2020 [employer] Back Exercises These exercises help to make your trunk and back strong. They also help to keep the lower back flexible. Doing these exercises can help to prevent back pain or lessen existing pain.  If you have back pain, try to do these exercises 2-3 times each day or as told by your doctor.  As you get better, do the exercises once each day. Repeat the exercises more often as told by your doctor.  To stop back pain from coming back, do the exercises once each day, or as told by your   doctor. Exercises Single knee to chest Do these steps 3-5 times in a row for each leg: 1. Lie on your back on a firm bed or the floor with your legs stretched out. 2. Bring one knee to your chest. 3. Grab your knee or thigh with both hands and hold them it in place. 4. Pull on your knee until you feel a gentle stretch in your lower back or buttocks. 5. Keep doing the stretch for 10-30 seconds. 6. Slowly let go of your leg and straighten it. Pelvic tilt Do these steps 5-10 times in a row: 1. Lie on your back on a firm bed or the floor with your legs stretched out. 2. Bend your knees so they point up to the ceiling. Your feet should be flat on the floor. 3. Tighten your lower belly (abdomen) muscles to press your lower back against the floor. This will make your tailbone point up to the ceiling instead of pointing down to your feet or the floor. 4. Stay in this position for 5-10 seconds while you gently tighten your muscles and breathe evenly. Cat-cow Do these steps until your lower back bends more  easily: 1. Get on your hands and knees on a firm surface. Keep your hands under your shoulders, and keep your knees under your hips. You may put padding under your knees. 2. Let your head hang down toward your chest. Tighten (contract) the muscles in your belly. Point your tailbone toward the floor so your lower back becomes rounded like the back of a cat. 3. Stay in this position for 5 seconds. 4. Slowly lift your head. Let the muscles of your belly relax. Point your tailbone up toward the ceiling so your back forms a sagging arch like the back of a cow. 5. Stay in this position for 5 seconds.  Press-ups Do these steps 5-10 times in a row: 1. Lie on your belly (face-down) on the floor. 2. Place your hands near your head, about shoulder-width apart. 3. While you keep your back relaxed and keep your hips on the floor, slowly straighten your arms to raise the top half of your body and lift your shoulders. Do not use your back muscles. You may change where you place your hands in order to make yourself more comfortable. 4. Stay in this position for 5 seconds. 5. Slowly return to lying flat on the floor.  Bridges Do these steps 10 times in a row: 1. Lie on your back on a firm surface. 2. Bend your knees so they point up to the ceiling. Your feet should be flat on the floor. Your arms should be flat at your sides, next to your body. 3. Tighten your butt muscles and lift your butt off the floor until your waist is almost as high as your knees. If you do not feel the muscles working in your butt and the back of your thighs, slide your feet 1-2 inches farther away from your butt. 4. Stay in this position for 3-5 seconds. 5. Slowly lower your butt to the floor, and let your butt muscles relax. If this exercise is too easy, try doing it with your arms crossed over your chest. Belly crunches Do these steps 5-10 times in a row: 1. Lie on your back on a firm bed or the floor with your legs stretched  out. 2. Bend your knees so they point up to the ceiling. Your feet should be flat on the floor. 3. Cross your arms over   your chest. 4. Tip your chin a little bit toward your chest but do not bend your neck. 5. Tighten your belly muscles and slowly raise your chest just enough to lift your shoulder blades a tiny bit off of the floor. Avoid raising your body higher than that, because it can put too much stress on your low back. 6. Slowly lower your chest and your head to the floor. Back lifts Do these steps 5-10 times in a row: 1. Lie on your belly (face-down) with your arms at your sides, and rest your forehead on the floor. 2. Tighten the muscles in your legs and your butt. 3. Slowly lift your chest off of the floor while you keep your hips on the floor. Keep the back of your head in line with the curve in your back. Look at the floor while you do this. 4. Stay in this position for 3-5 seconds. 5. Slowly lower your chest and your face to the floor. Contact a doctor if:  Your back pain gets a lot worse when you do an exercise.  Your back pain does not get better 2 hours after you exercise. If you have any of these problems, stop doing the exercises. Do not do them again unless your doctor says it is okay. Get help right away if:  You have sudden, very bad back pain. If this happens, stop doing the exercises. Do not do them again unless your doctor says it is okay. This information is not intended to replace advice given to you by your health care provider. Make sure you discuss any questions you have with your health care provider. Document Revised: 01/27/2018 Document Reviewed: 01/27/2018 Elsevier Patient Education  2020 [employer] Inc[employer]  

## 2020-02-15 NOTE — Telephone Encounter (Signed)
Patient called to let you know Chiropractor did not take x-rays of her lower back. Does Brittany Hodges still want her to have those done here? Please call patient to advise.

## 2020-02-15 NOTE — Telephone Encounter (Signed)
I would recommend that the patient have x-rays of her lumbar spine and pelvis. She can return to our office or we can send her somewhere else to obtain these.

## 2020-02-15 NOTE — Telephone Encounter (Signed)
Advised per Brittany Sams, PA-C, I would recommend that the patient have x-rays of her lumbar spine and pelvis. She can return to our office or we can send her somewhere else to obtain these. Patient will come to our office on 02/16/2020 around 11 am to have x-rays.

## 2020-02-15 NOTE — Telephone Encounter (Signed)
Opened in error

## 2020-02-16 ENCOUNTER — Ambulatory Visit: Payer: Self-pay

## 2020-02-16 ENCOUNTER — Ambulatory Visit (INDEPENDENT_AMBULATORY_CARE_PROVIDER_SITE_OTHER): Payer: BC Managed Care – PPO

## 2020-02-16 ENCOUNTER — Other Ambulatory Visit: Payer: Self-pay | Admitting: *Deleted

## 2020-02-16 DIAGNOSIS — G8929 Other chronic pain: Secondary | ICD-10-CM | POA: Diagnosis not present

## 2020-02-16 DIAGNOSIS — M545 Low back pain, unspecified: Secondary | ICD-10-CM | POA: Diagnosis not present

## 2020-02-16 DIAGNOSIS — M0579 Rheumatoid arthritis with rheumatoid factor of multiple sites without organ or systems involvement: Secondary | ICD-10-CM

## 2020-02-16 DIAGNOSIS — M7061 Trochanteric bursitis, right hip: Secondary | ICD-10-CM

## 2020-02-16 LAB — COMPLETE METABOLIC PANEL WITH GFR
AG Ratio: 1.2 (calc) (ref 1.0–2.5)
ALT: 13 U/L (ref 6–29)
AST: 22 U/L (ref 10–35)
Albumin: 4.5 g/dL (ref 3.6–5.1)
Alkaline phosphatase (APISO): 70 U/L (ref 37–153)
BUN: 14 mg/dL (ref 7–25)
CO2: 26 mmol/L (ref 20–32)
Calcium: 10.3 mg/dL (ref 8.6–10.4)
Chloride: 103 mmol/L (ref 98–110)
Creat: 0.85 mg/dL (ref 0.50–1.05)
GFR, Est African American: 89 mL/min/{1.73_m2} (ref >=60)
GFR, Est Non African American: 77 mL/min/{1.73_m2} (ref >=60)
Globulin: 3.7 g/dL (calc) (ref 1.9–3.7)
Glucose, Bld: 116 mg/dL — ABNORMAL HIGH (ref 65–99)
Potassium: 4.2 mmol/L (ref 3.5–5.3)
Sodium: 140 mmol/L (ref 135–146)
Total Bilirubin: 0.5 mg/dL (ref 0.2–1.2)
Total Protein: 8.2 g/dL — ABNORMAL HIGH (ref 6.1–8.1)

## 2020-02-16 LAB — CBC WITH DIFFERENTIAL/PLATELET
Absolute Monocytes: 191 cells/uL — ABNORMAL LOW (ref 200–950)
Basophils Absolute: 12 cells/uL (ref 0–200)
Basophils Relative: 0.2 %
Eosinophils Absolute: 0 cells/uL — ABNORMAL LOW (ref 15–500)
Eosinophils Relative: 0 %
HCT: 39.9 % (ref 35.0–45.0)
Hemoglobin: 12.5 g/dL (ref 11.7–15.5)
Lymphs Abs: 626 cells/uL — ABNORMAL LOW (ref 850–3900)
MCH: 27.7 pg (ref 27.0–33.0)
MCHC: 31.3 g/dL — ABNORMAL LOW (ref 32.0–36.0)
MCV: 88.5 fL (ref 80.0–100.0)
MPV: 13.2 fL — ABNORMAL HIGH (ref 7.5–12.5)
Monocytes Relative: 3.3 %
Neutro Abs: 4971 cells/uL (ref 1500–7800)
Neutrophils Relative %: 85.7 %
Platelets: 139 10*3/uL — ABNORMAL LOW (ref 140–400)
RBC: 4.51 10*6/uL (ref 3.80–5.10)
RDW: 12.2 % (ref 11.0–15.0)
Total Lymphocyte: 10.8 %
WBC: 5.8 10*3/uL (ref 3.8–10.8)

## 2020-02-16 NOTE — Addendum Note (Signed)
Addended by: Shona Needles on: 02/16/2020 01:42 PM   Modules accepted: Orders

## 2020-02-16 NOTE — Progress Notes (Signed)
CBC and CMP are stable.  Lymphocyte count is low.  We will continue to monitor.

## 2020-03-04 NOTE — Progress Notes (Deleted)
Cardiology Office Note Date:  03/04/2020  Patient ID:  Brittany Hodges, Brittany Hodges [birth year], MRN [id number] PCP:  Colon Branch, MD  Cardiologist:  Dr. Caryl Comes   Chief Complaint: *** annual visit  History of Present Illness: Brittany Hodges is a 54 y.o. female with history of sinus node dysfunction/chronoctropic incompetence, ASD repair, hypothyroidism, IBS, fibromyalgia, HTN, asthma, RA   I saw her back in 2017 She last had lase seen Dr. Caryl Comes in April at that time he described her as largely asymptomatic from SN dysfunction, though should she develop symptoms, pursuing pacing would be reasonable.  She has hx of CP underwent myoview scanning in the fall 2011 demonstrating mild ischemia v attenusation with EF 52% and min LV dilatation, cardiac cath 07/31/10: normal coronaries; vigorous LVF.  She had a hospital stay at [facility], discharged 05/07/16.  She reported standing in her kitchen talking with her son, feeling like something was wrong and fainted.  She reports being told she was told a few seconds, maybe about 45 seconds, her son caught her and did not fall to the grounds or suffer trauma.  She had no CP or sensation of palpitations, no SOB.  Since then in general feels generally weak and has felt like she gets lightheaded no near syncope or syncope.  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 She was evaluated by cardiology there and her SB was not felt to contribute to the event, noted to have + orthostatic changes and urged to stay adequately hydrated  [place] note: Discharge Diagnoses: Principal Problem: Seizure-like activity (RAF-HCC) Active Problems: Allergic rhinitis Essential hypertension (RAF-HCC) Gastroesophageal reflux disease (RAF-HCC) Hypothyroidism (RAF-HCC) Hypokalemia Orthostatic hypotension Sinus bradycardia Hyperthyroidism (RAF-HCC) Resolved Problems: * No resolved hospital problems. [facility] Course:  54 y.o. F w hx of  hypothyroidism on Synthroid, history of ASD repair at the age of 60, who follows up at [place] cardiology who was brought to ER for eval from home.  at home she was standing and talking to her kid around 8-8.30 pm, then she suddenly "feel funny" with some blurred vision, as per her children see "blanked out" for a brief seconds . patient denied any preceding chest pain, palpitation, diaphoresis shortness of nausea vomiting or any other symptoms. No focal weakness. In the ER she was noted to be bradycardic, CT head without any acute finding, labs showed mild hypokalemia. She was admitted for evaluation. Differential included presyncopal episode/seizure episode vs from orthostasis as she was orthostatic in ER. She was seen by neurology. MRI and EEG the brain was unremarkable. Seen by cardiology given her bradycardia however her bradycardia has been worked up in the past his runs low at baseline. Discussed with Dr. Minna Merritts from [place] [facility] who doesn't feel bradycardia is related to her presentation and he has advised follow-up as outpatient, he stated he can plan on o/p loop recorder and monitor if pt chosese to do so from him or she may f/u with her own cardiology. He was hydrated she is orthostatic negative at this point. Her TSH was low and free t 4 high-I have held her Synthroid and advised her to follow-up with her PCP next week check her labs and resume as per primary care. Rest of medical comorbidities are stable and are chronic and she'll continue on her home medication.    Most recently, she last saw Dr. Caryl Comes Oct 2020, he mentioned h/o junctional rhythm for her.  Discussed an episode of lightheadedness  associated prolonged squatting position and likely orthostatic.  Discussed if she had recurrent syncope he would plan loop implant, her EKG newly abnormal noting a recent normal POET.  *** symptoms, syncope, angina *** meds, nodal blockers? *** labs, lipids *** SBE prophylaxis      Past  Medical History:  Diagnosis Date  . Allergic rhinitis, cause unspecified   . Anemia   . Asthma   . Atrial septal defect    s/p repair;   Echo 08/07/10: EF 55-60%; mild MR; atrial septal aneurysm; no residual ASD  . Chest pain, unspecified    cardiac cath 07/31/10: normal coronaries; vigorous LVF  . Esophageal reflux   . Fibromyalgia   . Headache(784.0)   . Hematuria, unspecified   . Hypertension    no meds   . Hypothyroidism   . Irritable bowel syndrome   . Low back pain   . Rheumatoid arthritis(714.0)    Dr Herold Harms  . Scoliosis   . Sinoatrial node dysfunction (HCC)    eval. for chronotropic competence completed in past    Past Surgical History:  Procedure Laterality Date  . ASD REPAIR  1985  . TONSILLECTOMY AND ADENOIDECTOMY    . TUBAL LIGATION      Current Outpatient Medications  Medication Sig Dispense Refill  . albuterol (PROAIR HFA) 108 (90 Base) MCG/ACT inhaler Inhale 2 puffs into the lungs every 6 (six) hours as needed. For shortness of breath and wheezing 1 Inhaler 6  . amLODipine (NORVASC) 5 MG tablet TAKE 1 TABLET DAILY 90 tablet 0  . azelastine (ASTELIN) 0.1 % nasal spray 1-2 sprays per nostril twice daily as needed for runny nose. 30 mL 5  . BLACK CURRANT SEED OIL PO Take by mouth daily.    . fluticasone (FLONASE) 50 MCG/ACT nasal spray Place 2 sprays into both nostrils daily as needed. 16 g 6  . Fluticasone Propionate (XHANCE) 93 MCG/ACT EXHU 2 sprays per nostril twice daily. (Patient not taking: Reported on 02/15/2020) 32 mL 5  . Ginger, Zingiber officinalis, (GINGER ROOT PO) Take by mouth.    . Misc Natural Products (TART CHERRY ADVANCED PO) Take by mouth.    Marland Kitchen omeprazole (PRILOSEC) 40 MG capsule Take 1 capsule (40 mg total) by mouth in the morning and at bedtime. (Patient not taking: Reported on 02/15/2020) 60 capsule 6  . prednisoLONE acetate (PRED FORTE) 1 % ophthalmic suspension Place 1 drop into both eyes daily.     Marland Kitchen Spacer/Aero-Holding Chambers  (AEROCHAMBER MV) inhaler Use as instructed 1 each 0  . TURMERIC PO Take by mouth.     No current facility-administered medications for this visit.    Allergies:   Etanercept, Methotrexate derivatives, and Pregabalin   Social History:  The patient  reports that she has never smoked. She has never used smokeless tobacco. She reports that she does not drink alcohol and does not use drugs.   Family History:  The patient's family history includes Asthma in her son; Breast cancer in an other family member; Diabetes in her mother and another family member; Eczema in her son; Glaucoma in her father; Heart disease in her maternal grandmother; Hypertension in her mother, sister, and sister; Rheum arthritis in her maternal grandmother; Sinusitis in her daughter; Stomach cancer (age of onset: 66) in her maternal grandmother; Thyroid disease in her sister and sister.  ROS:  Please see the history of present illness.   All other systems are reviewed and otherwise negative.   PHYSICAL EXAM: VS:  LMP 05/01/2016  BMI: There is no height or weight on file to calculate BMI.  A recheck manually of her BP isi 156/70 (seated) Well nourished, well developed, in no acute distress  HEENT: normocephalic, atraumatic  Neck: no JVD, carotid bruits or masses Cardiac: *** RRR; bradycardic, no significant murmurs, no rubs, or gallops Lungs:  *** CTA b/l, no wheezing, rhonchi or rales  Abd: soft, nontender MS: no deformity or *** atrophy Ext: *** no edema  Skin: warm and dry, no rash Neuro:  No gross deficits appreciated Psych: euthymic mood, full affect    EKG:  Done today and reviewed by myself   02/15/2019: ETT  Blood pressure demonstrated a hypertensive response to exercise.  No T wave inversion was noted during stress.  Upsloping ST segment depression ST segment depression of 1 mm was noted during stress in the II, III, aVF, V6, V5 and V4 leads, and returning to baseline after 1-5 minutes of  recovery.  The patient experienced no angina during the stress test  Blood pressure demonstrated a hypertensive response to exercise  Overall, the patient's exercise capacity was excellent  [facility] Treadmill Score: low risk   Negative stress test without evidence of ischemia at given workload. Hypertensive response to exercise. Excellent exercise capacity - 13.4 METS.  05/07/2016: TTE Summary  Normal left ventricular size and systolic function with no appreciable  segmental abnormality.  Ejection fraction is visually estimated at 55-60%  Structurally normal mitral valve with good mobility.  Trace mitral regurgitation.  Mild tricuspid regurgitation     03/20/14: TTE Study Conclusions - Left ventricle: The cavity size was normal. Wall thickness was normal. Systolic function was low normal to mildly reduced. The estimated ejection fraction was in the range of 50% to 55%. Wall motion was normal; there were no regional wall motion abnormalities. Features are consistent with a pseudonormal left ventricular filling pattern, with concomitant abnormal relaxation and increased filling pressure (grade 2 diastolic dysfunction). - Aortic valve: There was trivial regurgitation. - Mitral valve: There was mild regurgitation. - Right ventricle: The cavity size was normal. Systolic function was normal. - Atrial septum: No residual ASD noted. - Tricuspid valve: There was mild-moderate regurgitation. Peak RV-RA gradient (S): 36 mm Hg. - Pulmonary arteries: PA peak pressure: 44 mm Hg (S). - Systemic veins: IVC measured 2.2 cm with > 50% respirophasic variation, suggesting RA pressure 8 mmHg. Impressions: - The patient was profoundly bradycardic during this study. Normal LV size with low normal to mildly decreased systolic function, EF 03-47%. Moderate diastolic dysfunction. Normal RV size and systolic function. Mild to moderate TR. Mild  pulmonary hypertension.  Recent Labs: 07/18/2019: TSH 3.66 02/15/2020: ALT 13; BUN 14; Creat 0.85; Hemoglobin 12.5; Platelets 139; Potassium 4.2; Sodium 140  No results found for requested labs within last 8760 hours.   CrCl cannot be calculated (Unknown ideal weight.).   Wt Readings from Last 3 Encounters:  02/15/20 129 lb 3.2 oz (58.6 kg)  11/14/19 133 lb 2 oz (60.4 kg)  09/18/19 127 lb 2 oz (57.7 kg)     Other studies reviewed: Additional studies/records reviewed today include: summarized above including [place] record  ASSESSMENT AND PLAN:  1. SN dysfunction 2. She has had h/o syncope though felt to be orthostatic     ***      3. Hx of ASD repair     ***Pt follows prophylaxis regime  4. HTN     No changes today   Disposition:***  Current medicines are  reviewed at length with the patient today.  The patient did not have any concerns regarding medicines.  Haywood Lasso, PA-C 03/04/2020 6:53 AM     [address] [place] [address] [phone] (office)  [phone] (fax)

## 2020-03-05 ENCOUNTER — Ambulatory Visit: Payer: BC Managed Care – PPO | Admitting: Physician Assistant

## 2020-03-15 ENCOUNTER — Ambulatory Visit: Payer: BC Managed Care – PPO | Admitting: Student

## 2020-03-15 NOTE — Progress Notes (Deleted)
PCP:  Colon Branch, MD Primary Cardiologist: No primary care provider on file. Electrophysiologist: None   Brittany Hodges is a 54 y.o. female seen today for None for {Blank single:19197::"cardiac clearance","post hospital follow up","acute visit due to ***","routine electrophysiology followup"}.  Since {Blank single:19197::"last being seen in our clinic","discharge from hospital"} the patient reports doing ***.  she denies chest pain, palpitations, dyspnea, PND, orthopnea, nausea, vomiting, dizziness, syncope, edema, weight gain, or early satiety.  Past Medical History:  Diagnosis Date  . Allergic rhinitis, cause unspecified   . Anemia   . Asthma   . Atrial septal defect    s/p repair;   Echo 08/07/10: EF 55-60%; mild MR; atrial septal aneurysm; no residual ASD  . Chest pain, unspecified    cardiac cath 07/31/10: normal coronaries; vigorous LVF  . Esophageal reflux   . Fibromyalgia   . Headache(784.0)   . Hematuria, unspecified   . Hypertension    no meds   . Hypothyroidism   . Irritable bowel syndrome   . Low back pain   . Rheumatoid arthritis(714.0)    Dr Herold Harms  . Scoliosis   . Sinoatrial node dysfunction (HCC)    eval. for chronotropic competence completed in past   Past Surgical History:  Procedure Laterality Date  . ASD REPAIR  1985  . TONSILLECTOMY AND ADENOIDECTOMY    . TUBAL LIGATION      Current Outpatient Medications  Medication Sig Dispense Refill  . albuterol (PROAIR HFA) 108 (90 Base) MCG/ACT inhaler Inhale 2 puffs into the lungs every 6 (six) hours as needed. For shortness of breath and wheezing 1 Inhaler 6  . amLODipine (NORVASC) 5 MG tablet TAKE 1 TABLET DAILY 90 tablet 0  . azelastine (ASTELIN) 0.1 % nasal spray 1-2 sprays per nostril twice daily as needed for runny nose. 30 mL 5  . BLACK CURRANT SEED OIL PO Take by mouth daily.    . fluticasone (FLONASE) 50 MCG/ACT nasal spray Place 2 sprays into both nostrils daily as needed. 16 g 6  .  Fluticasone Propionate (XHANCE) 93 MCG/ACT EXHU 2 sprays per nostril twice daily. (Patient not taking: Reported on 02/15/2020) 32 mL 5  . Ginger, Zingiber officinalis, (GINGER ROOT PO) Take by mouth.    . Misc Natural Products (TART CHERRY ADVANCED PO) Take by mouth.    Marland Kitchen omeprazole (PRILOSEC) 40 MG capsule Take 1 capsule (40 mg total) by mouth in the morning and at bedtime. (Patient not taking: Reported on 02/15/2020) 60 capsule 6  . prednisoLONE acetate (PRED FORTE) 1 % ophthalmic suspension Place 1 drop into both eyes daily.     Marland Kitchen Spacer/Aero-Holding Chambers (AEROCHAMBER MV) inhaler Use as instructed 1 each 0  . TURMERIC PO Take by mouth.     No current facility-administered medications for this visit.    Allergies  Allergen Reactions  . Etanercept   . Methotrexate Derivatives     Caused her WBC to drop  . Pregabalin     Social History   Socioeconomic History  . Marital status: Single    Spouse name: Not on file  . Number of children: 3  . Years of education: Not on file  . Highest education level: Not on file  Occupational History  . Occupation: HR assistant   Tobacco Use  . Smoking status: Never Smoker  . Smokeless tobacco: Never Used  . Tobacco comment: never used tobacco  Vaping Use  . Vaping Use: Never used  Substance and Sexual Activity  .  Alcohol use: No  . Drug use: No  . Sexual activity: Not Currently    Birth control/protection: None, Surgical  Other Topics Concern  . Not on file  Social History Narrative   Household-- pt , her mother and her youngest son    Social Determinants of Radio broadcast assistant Strain:   . Difficulty of Paying Living Expenses: Not on file  Food Insecurity:   . Worried About Charity fundraiser in the Last Year: Not on file  . Ran Out of Food in the Last Year: Not on file  Transportation Needs:   . Lack of Transportation (Medical): Not on file  . Lack of Transportation (Non-Medical): Not on file  Physical Activity:   .  Days of Exercise per Week: Not on file  . Minutes of Exercise per Session: Not on file  Stress:   . Feeling of Stress : Not on file  Social Connections:   . Frequency of Communication with Friends and Family: Not on file  . Frequency of Social Gatherings with Friends and Family: Not on file  . Attends Religious Services: Not on file  . Active Member of Clubs or Organizations: Not on file  . Attends Archivist Meetings: Not on file  . Marital Status: Not on file  Intimate Partner Violence:   . Fear of Current or Ex-Partner: Not on file  . Emotionally Abused: Not on file  . Physically Abused: Not on file  . Sexually Abused: Not on file     Review of Systems: General: No chills, fever, night sweats or weight changes  Cardiovascular:  No chest pain, dyspnea on exertion, edema, orthopnea, palpitations, paroxysmal nocturnal dyspnea Dermatological: No rash, lesions or masses Respiratory: No cough, dyspnea Urologic: No hematuria, dysuria Abdominal: No nausea, vomiting, diarrhea, bright red blood per rectum, melena, or hematemesis Neurologic: No visual changes, weakness, changes in mental status All other systems reviewed and are otherwise negative except as noted above.  Physical Exam: There were no vitals filed for this visit.  GEN- The patient is well appearing, alert and oriented x 3 today.   HEENT: normocephalic, atraumatic; sclera clear, conjunctiva pink; hearing intact; oropharynx clear; neck supple, no JVP Lymph- no cervical lymphadenopathy Lungs- Clear to ausculation bilaterally, normal work of breathing.  No wheezes, rales, rhonchi Heart- Regular rate and rhythm, no murmurs, rubs or gallops, PMI not laterally displaced GI- soft, non-tender, non-distended, bowel sounds present, no hepatosplenomegaly Extremities- no clubbing, cyanosis, or edema; DP/PT/radial pulses 2+ bilaterally MS- no significant deformity or atrophy Skin- warm and dry, no rash or lesion Psych-  euthymic mood, full affect Neuro- strength and sensation are intact  EKG {ACTION; IS/IS [id number]} ordered. Personal review of EKG from {Blank single:19197::"today","***"} shows ***  Additional studies reviewed include: ***  Assessment and Plan:  1. ***  2. ***  Shirley Friar, PA-C  03/15/20 10:31 AM

## 2020-03-22 ENCOUNTER — Other Ambulatory Visit: Payer: Self-pay | Admitting: Internal Medicine

## 2020-03-26 ENCOUNTER — Encounter: Payer: Self-pay | Admitting: Internal Medicine

## 2020-03-26 ENCOUNTER — Encounter: Payer: BC Managed Care – PPO | Admitting: Internal Medicine

## 2020-05-28 ENCOUNTER — Other Ambulatory Visit: Payer: Self-pay

## 2020-05-28 ENCOUNTER — Encounter: Payer: Self-pay | Admitting: Internal Medicine

## 2020-05-28 ENCOUNTER — Ambulatory Visit (INDEPENDENT_AMBULATORY_CARE_PROVIDER_SITE_OTHER): Payer: 59 | Admitting: Internal Medicine

## 2020-05-28 VITALS — BP 142/77 | HR 49 | Temp 98.2°F | Resp 18 | Ht 69.0 in | Wt 128.1 lb

## 2020-05-28 DIAGNOSIS — Z23 Encounter for immunization: Secondary | ICD-10-CM | POA: Diagnosis not present

## 2020-05-28 DIAGNOSIS — L03115 Cellulitis of right lower limb: Secondary | ICD-10-CM

## 2020-05-28 DIAGNOSIS — E038 Other specified hypothyroidism: Secondary | ICD-10-CM

## 2020-05-28 MED ORDER — CEPHALEXIN 500 MG PO CAPS: 500.0000 mg | ORAL_CAPSULE | Freq: Four times a day (QID) | ORAL | 0 refills | Status: DC

## 2020-05-28 NOTE — Progress Notes (Signed)
Pre visit review using our clinic review tool, if applicable. No additional management support is needed unless otherwise documented below in the visit note. 

## 2020-05-28 NOTE — Patient Instructions (Addendum)
Take antibiotic as prescribed  Call if you are not getting better

## 2020-05-28 NOTE — Progress Notes (Signed)
Subjective:    Patient ID: Brittany Hodges, female    DOB: 30-Mar-1966, 55 y.o.   MRN: [id number]  DOS:  05/28/2020 Type of visit - description: Acute Last week, she was helping her  daughter move and scraped the right leg. Since then she noticed some redness and she think  has cellulitis. Denies any fever chills No discharge No pain.  Also request her thyroid to be checked.    BP Readings from Last 3 Encounters:  05/28/20 (!) 142/77  02/15/20 (!) 137/91  11/14/19 106/72   Wt Readings from Last 3 Encounters:  05/28/20 128 lb 2 oz (58.1 kg)  02/15/20 129 lb 3.2 oz (58.6 kg)  11/14/19 133 lb 2 oz (60.4 kg)     Review of Systems See above   Past Medical History:  Diagnosis Date  . Allergic rhinitis, cause unspecified   . Anemia   . Asthma   . Atrial septal defect    s/p repair;   Echo 08/07/10: EF 55-60%; mild MR; atrial septal aneurysm; no residual ASD  . Chest pain, unspecified    cardiac cath 07/31/10: normal coronaries; vigorous LVF  . Esophageal reflux   . Fibromyalgia   . Headache(784.0)   . Hematuria, unspecified   . Hypertension    no meds   . Hypothyroidism   . Irritable bowel syndrome   . Low back pain   . Rheumatoid arthritis(714.0)    Dr Herold Harms  . Scoliosis   . Sinoatrial node dysfunction (HCC)    eval. for chronotropic competence completed in past    Past Surgical History:  Procedure Laterality Date  . ASD REPAIR  1985  . TONSILLECTOMY AND ADENOIDECTOMY    . TUBAL LIGATION      Allergies as of 05/28/2020      Reactions   Etanercept    Methotrexate Derivatives    Caused her WBC to drop   Pregabalin       Medication List       Accurate as of May 28, 2020 11:59 PM. If you have any questions, ask your nurse or doctor.        AeroChamber MV inhaler Use as instructed   albuterol 108 (90 Base) MCG/ACT inhaler Commonly known as: ProAir HFA Inhale 2 puffs into the lungs every 6 (six) hours as needed. For shortness of breath  and wheezing   amLODipine 5 MG tablet Commonly known as: NORVASC Take 1 tablet (5 mg total) by mouth daily.   azelastine 0.1 % nasal spray Commonly known as: ASTELIN 1-2 sprays per nostril twice daily as needed for runny nose.   BLACK CURRANT SEED OIL PO Take by mouth daily.   cephALEXin 500 MG capsule Commonly known as: KEFLEX Take 1 capsule (500 mg total) by mouth 4 (four) times daily. Started by: Kathlene November, MD   fluticasone 50 MCG/ACT nasal spray Commonly known as: FLONASE Place 2 sprays into both nostrils daily as needed.   Xhance 93 MCG/ACT Exhu Generic drug: Fluticasone Propionate 2 sprays per nostril twice daily.   GINGER ROOT PO Take by mouth.   omeprazole 40 MG capsule Commonly known as: PRILOSEC Take 1 capsule (40 mg total) by mouth in the morning and at bedtime.   prednisoLONE acetate 1 % ophthalmic suspension Commonly known as: PRED FORTE Place 1 drop into both eyes daily.   TART CHERRY ADVANCED PO Take by mouth.   TURMERIC PO Take by mouth.          Objective:  Physical Exam Musculoskeletal:       Legs:    BP (!) 142/77 (BP Location: Left Arm, Patient Position: Sitting, Cuff Size: Small)   Pulse (!) 49   Temp 98.2 F (36.8 C) (Oral)   Resp 18   Ht 5\' 9"  (1.753 m)   Wt 128 lb 2 oz (58.1 kg)   LMP 05/01/2016   SpO2 100%   BMI 18.92 kg/m  General:   Well developed, NAD, BMI noted. HEENT:  Normocephalic . Face symmetric, atraumatic Lungs:  CTA B Normal respiratory effort, no intercostal retractions, no accessory muscle use. Heart: RRR,  no murmur.  Lower extremities: no pretibial edema bilaterally  Skin: Not pale. Not jaundice Neurologic:  alert & oriented X3.  Speech normal, gait appropriate for age and unassisted Psych--  Cognition and judgment appear intact.  Cooperative with normal attention span and concentration.  Behavior appropriate. No anxious or depressed appearing.      Assessment     Assessment   Prediabetes:  A1c 6.0 (01-2017) HTN Hypothyroidism GERD- dysphagia -Dr. Norville Haggard EGD from 04/02/2014 (Dr Delrae Alfred): Narrowing of proximal esophagus, question of extrinsic extrinsic compression, EUS showing no intrinsic esophageal mass but the spine was seen in close proximity to the esophagus probably resulting in extrinsic compression. The endoscopist recommend repeat CT neck and chest in 3 months.saw ENT 04-2015 for dysphagia,had a scope,  rx PPI Asthma, uses alb rarely  CV: --Atrial septal defect: Status post repair, no residual ASD --Sinoatrial  Dysfunction, sees Dr Caryl Comes   --CP: cath 2012, normal coronaries MSK: --Rheumatoid arthritis,   Dr. Herold Harms + rheumatoid factor +CCP,   h/o  low WBC with Methotrexate,   injection site reaction to Enbrel  , self d/c Humira ~ 12/2018 --Fibromyalgia IBS (Miralax prn constipation)   Admission: syncope 04-2016 (unlikely to be arrhythmia related per cards )  PLAN:  Cellulitis: Early cellulitis, Tdap today, Keflex for few days, call if no better Subclinical hypothyroidism, check TFTs Preventive care: Reluctant to take a COVID-vaccine booster or a flu shot, benefits discussed. Social: Lost her mother last week, condolences provided. RTC scheduled for few weeks, CPX.  This visit occurred during the SARS-CoV-2 public health emergency.  Safety protocols were in place, including screening questions prior to the visit, additional usage of staff PPE, and extensive cleaning of exam room while observing appropriate contact time as indicated for disinfecting solutions.

## 2020-05-29 LAB — T4, FREE: Free T4: 0.85 ng/dL (ref 0.60–1.60)

## 2020-05-29 LAB — TSH: TSH: 4.07 u[IU]/mL (ref 0.35–4.50)

## 2020-05-29 NOTE — Assessment & Plan Note (Signed)
Cellulitis: Early cellulitis, Tdap today, Keflex for few days, call if no better Subclinical hypothyroidism, check TFTs Preventive care: Reluctant to take a COVID-vaccine booster or a flu shot, benefits discussed. Social: Lost her mother last week, condolences provided. RTC scheduled for few weeks, CPX.

## 2020-06-07 ENCOUNTER — Other Ambulatory Visit: Payer: Self-pay

## 2020-06-07 ENCOUNTER — Ambulatory Visit: Payer: 59 | Admitting: Internal Medicine

## 2020-06-07 ENCOUNTER — Encounter: Payer: Self-pay | Admitting: Internal Medicine

## 2020-06-07 ENCOUNTER — Ambulatory Visit (HOSPITAL_BASED_OUTPATIENT_CLINIC_OR_DEPARTMENT_OTHER)
Admission: RE | Admit: 2020-06-07 | Discharge: 2020-06-07 | Disposition: A | Discharge status: Home / Self Care | Payer: 59 | Source: Ambulatory Visit | Attending: Internal Medicine | Admitting: Internal Medicine

## 2020-06-07 VITALS — BP 142/82 | HR 55 | Temp 98.4°F | Ht 69.0 in | Wt 126.4 lb

## 2020-06-07 DIAGNOSIS — R1310 Dysphagia, unspecified: Secondary | ICD-10-CM

## 2020-06-07 DIAGNOSIS — K0889 Other specified disorders of teeth and supporting structures: Secondary | ICD-10-CM

## 2020-06-07 DIAGNOSIS — K1120 Sialoadenitis, unspecified: Secondary | ICD-10-CM | POA: Diagnosis not present

## 2020-06-07 MED ORDER — AMOXICILLIN-POT CLAVULANATE 875-125 MG PO TABS
1.0000 | ORAL_TABLET | Freq: Two times a day (BID) | ORAL | 0 refills | Status: DC
Start: 2020-06-07 — End: 2020-06-12

## 2020-06-07 NOTE — Progress Notes (Signed)
Subjective:    Patient ID: Brittany Hodges, female    DOB: Sep 02, 1965, 55 y.o.   MRN: [id number]  DOS:  06/07/2020 Type of visit - description: Acute For the last 5 days he is having right ear ache. She also reports dysphagia, she feels pain at the right side of the throat & right posterior chest when she swallows. Has also noted some swelling at the right side of the face.   Denies any fever chills Had initially some sinus congestion but that is largely resolved. No cough or chest congestion She does have some dental pain.   Review of Systems See above   Past Medical History:  Diagnosis Date  . Allergic rhinitis, cause unspecified   . Anemia   . Asthma   . Atrial septal defect    s/p repair;   Echo 08/07/10: EF 55-60%; mild MR; atrial septal aneurysm; no residual ASD  . Chest pain, unspecified    cardiac cath 07/31/10: normal coronaries; vigorous LVF  . Esophageal reflux   . Fibromyalgia   . Headache(784.0)   . Hematuria, unspecified   . Hypertension    no meds   . Hypothyroidism   . Irritable bowel syndrome   . Low back pain   . Rheumatoid arthritis(714.0)    Dr Herold Harms  . Scoliosis   . Sinoatrial node dysfunction (HCC)    eval. for chronotropic competence completed in past    Past Surgical History:  Procedure Laterality Date  . ASD REPAIR  1985  . TONSILLECTOMY AND ADENOIDECTOMY    . TUBAL LIGATION      Allergies as of 06/07/2020      Reactions   Etanercept    Methotrexate Derivatives    Caused her WBC to drop   Pregabalin       Medication List       Accurate as of June 07, 2020 11:16 AM. If you have any questions, ask your nurse or doctor.        AeroChamber MV inhaler Use as instructed   albuterol 108 (90 Base) MCG/ACT inhaler Commonly known as: ProAir HFA Inhale 2 puffs into the lungs every 6 (six) hours as needed. For shortness of breath and wheezing   amLODipine 5 MG tablet Commonly known as: NORVASC Take 1 tablet (5 mg  total) by mouth daily.   azelastine 0.1 % nasal spray Commonly known as: ASTELIN 1-2 sprays per nostril twice daily as needed for runny nose.   BLACK CURRANT SEED OIL PO Take by mouth daily.   cephALEXin 500 MG capsule Commonly known as: KEFLEX Take 1 capsule (500 mg total) by mouth 4 (four) times daily.   fluticasone 50 MCG/ACT nasal spray Commonly known as: FLONASE Place 2 sprays into both nostrils daily as needed.   Xhance 93 MCG/ACT Exhu Generic drug: Fluticasone Propionate 2 sprays per nostril twice daily.   GINGER ROOT PO Take by mouth.   omeprazole 40 MG capsule Commonly known as: PRILOSEC Take 1 capsule (40 mg total) by mouth in the morning and at bedtime.   prednisoLONE acetate 1 % ophthalmic suspension Commonly known as: PRED FORTE Place 1 drop into both eyes daily.   TART CHERRY ADVANCED PO Take by mouth.   TURMERIC PO Take by mouth.          Objective:   Physical Exam BP (!) 142/82 (BP Location: Left Arm, Patient Position: Sitting, Cuff Size: Large)   Pulse (!) 55   Temp 98.4 F (36.9 C) (Oral)  Ht 5\' 9"  (1.753 m)   Wt 126 lb 6.4 oz (57.3 kg)   LMP 05/01/2016   SpO2 99%   BMI 18.67 kg/m  General:   Well developed, NAD, BMI noted.  HEENT:  Normocephalic . Face symmetric, atraumatic Right ear: TM slightly bulged but otherwise exam is normal Left ear: Normal Throat: Symmetric, no redness or discharge Dental  right bottom molar is slightly TTP and percussion, she is missing several dental pieces, gum palpation is normal. TMJ: No click, not tender. Parotid glands: Subtle swelling of the right parotid gland, mildly tender to palpation.  No warmness.  No redness. Neck: No thyromegaly, has few bilateral lymphadenopathies, mobile, less than 1 cm.  No supraclavicular mass Lungs:  CTA B Normal respiratory effort, no intercostal retractions, no accessory muscle use. Heart: RRR,  no murmur.  Skin: Not pale. Not jaundice Lower extremities: no  pretibial edema bilaterally  Neurologic:  alert & oriented X3.  Speech normal, gait appropriate for age and unassisted Psych--  Cognition and judgment appear intact.  Cooperative with normal attention span and concentration.  Behavior appropriate. No anxious or depressed appearing.     Assessment       Assessment   Prediabetes: A1c 6.0 (01-2017) HTN Hypothyroidism GERD- dysphagia -Dr. Norville Haggard EGD from 04/02/2014 (Dr Delrae Alfred): Narrowing of proximal esophagus, question of extrinsic extrinsic compression, EUS showing no intrinsic esophageal mass but the spine was seen in close proximity to the esophagus probably resulting in extrinsic compression. The endoscopist recommend repeat CT neck and chest in 3 months.saw ENT 04-2015 for dysphagia,had a scope,  rx PPI Asthma, uses alb rarely  CV: --Atrial septal defect: Status post repair, no residual ASD --Sinoatrial  Dysfunction, sees Dr Caryl Comes   --CP: cath 2012, normal coronaries MSK: --Rheumatoid arthritis,   Dr. Herold Harms + rheumatoid factor +CCP,   h/o  low WBC with Methotrexate,   injection site reaction to Enbrel  , self d/c Humira ~ 12/2018 --Fibromyalgia IBS (Miralax prn constipation)   Admission: syncope 04-2016 (unlikely to be arrhythmia related per cards )  PLAN:  Parotitis: Otalgia likely related to mild inflammation of the right parotid gland.  She already has an appointment with ENT.  A formal referral sent. Plan: Warm compress, lemon drops, Tylenol/Advil as needed.  If she is not better start an antibiotic, see AVS.  Keep ENT appointment. Dental pain: Right molar is TTP with percussion, see physical exam, rec to see her dentist.  No obvious abscess on exam. Dysphagia: Unclear what she has pain when she swallows, see description in HPI.  For completeness we will get a chest x-ray.   This visit occurred during the SARS-CoV-2 public health emergency.  Safety protocols were in place, including screening questions prior to the  visit, additional usage of staff PPE, and extensive cleaning of exam room while observing appropriate contact time as indicated for disinfecting solutions.

## 2020-06-07 NOTE — Progress Notes (Signed)
Cardiology Office Note Date:  06/07/2020  Patient ID:  Brittany Hodges, Brittany Hodges March 13, 1966, MRN [id number] PCP:  Colon Branch, MD  Cardiologist:  Dr. Caryl Comes   Chief Complaint: overdue annual visit  History of Present Illness: Brittany Hodges is a 55 y.o. female with history of ASD repair with resultant junctional rhythm, SN dysfunction, hypothyroidism, IBS, fibromyalgia, HTN, asthma, RA   She had a hospital stay at [facility], discharged 05/07/16.  She reports standing in her kitchen talking with her son, feeling like something was wrong and fainted.  She reports being told she was told a few seconds, maybe about 45 seconds, her son caught her and did not fall to the grounds or suffer trauma.  She had no CP or sensation of palpitations, no SOB.   She was seen by neurology. MRI and EEG the brain was unremarkable. Seen by cardiology given her bradycardia however her bradycardia has been worked up in the past his runs low at baseline. Discussed with Dr. Minna Merritts from [facility] cardiology who doesn't feel bradycardia is related to her presentation and he has advised follow-up as outpatient,  She comes today to be seen for Dr. Caryl Comes, last seen by him via tele-health June 2020.  She was working from home, caring for her aging mother who had dementia and on HD.  Bothered by her arthritis, no symptoms of bradycardia, and did not seem bothered by her bradycardia though felt EKG and GXT to be done is 54mo was indicated.  EKG 01/2019 with SB 52bpm, normal intervals  EST 02/15/2019 with resting HR 47 and peak 155bpm, described as having excellent exertional capacity and without evidence of ischemia.  She saw her PMD 06/07/20 with some degree of dysphagia and ear ache dx w/parotitis, already referred to ENT and rx keflex for early cellulitis. (also mentioned recent passing of her Mom)  TODAY She feels quite well. No CP, palpitations or cardiac awareness. She goes to the gym 5-6x/week, is a class with stations  including cardio and light weights and rports excellent exertional capacity, no symptoms No dizzy spells, near syncope or syncope. Recently had labs done with her PMD, reported thyroid, lipids all were good. Ear cleared up well (noted above)  She reports that her mom died after having 4 recurrent strokes found with carotid disease apparently as the cause.  She worries about this and wonders if we can check this.  Past Medical History:  Diagnosis Date  . Allergic rhinitis, cause unspecified   . Anemia   . Asthma   . Atrial septal defect    s/p repair;   Echo 08/07/10: EF 55-60%; mild MR; atrial septal aneurysm; no residual ASD  . Chest pain, unspecified    cardiac cath 07/31/10: normal coronaries; vigorous LVF  . Esophageal reflux   . Fibromyalgia   . Headache(784.0)   . Hematuria, unspecified   . Hypertension    no meds   . Hypothyroidism   . Irritable bowel syndrome   . Low back pain   . Rheumatoid arthritis(714.0)    Dr Herold Harms  . Scoliosis   . Sinoatrial node dysfunction (HCC)    eval. for chronotropic competence completed in past    Past Surgical History:  Procedure Laterality Date  . ASD REPAIR  1985  . TONSILLECTOMY AND ADENOIDECTOMY    . TUBAL LIGATION      Current Outpatient Medications  Medication Sig Dispense Refill  . albuterol (PROAIR HFA) 108 (90 Base) MCG/ACT inhaler Inhale 2 puffs  into the lungs every 6 (six) hours as needed. For shortness of breath and wheezing 1 Inhaler 6  . amLODipine (NORVASC) 5 MG tablet Take 1 tablet (5 mg total) by mouth daily. 90 tablet 0  . amoxicillin-clavulanate (AUGMENTIN) 875-125 MG tablet Take 1 tablet by mouth 2 (two) times daily. 14 tablet 0  . azelastine (ASTELIN) 0.1 % nasal spray 1-2 sprays per nostril twice daily as needed for runny nose. 30 mL 5  . BLACK CURRANT SEED OIL PO Take by mouth daily.    . cephALEXin (KEFLEX) 500 MG capsule Take 1 capsule (500 mg total) by mouth 4 (four) times daily. (Patient not taking:  Reported on 06/07/2020) 20 capsule 0  . fluticasone (FLONASE) 50 MCG/ACT nasal spray Place 2 sprays into both nostrils daily as needed. 16 g 6  . Fluticasone Propionate (XHANCE) 93 MCG/ACT EXHU 2 sprays per nostril twice daily. (Patient not taking: No sig reported) 32 mL 5  . Ginger, Zingiber officinalis, (GINGER ROOT PO) Take by mouth.    . Misc Natural Products (TART CHERRY ADVANCED PO) Take by mouth.    Marland Kitchen omeprazole (PRILOSEC) 40 MG capsule Take 1 capsule (40 mg total) by mouth in the morning and at bedtime. (Patient not taking: No sig reported) 60 capsule 6  . prednisoLONE acetate (PRED FORTE) 1 % ophthalmic suspension Place 1 drop into both eyes daily.     Marland Kitchen Spacer/Aero-Holding Chambers (AEROCHAMBER MV) inhaler Use as instructed 1 each 0  . TURMERIC PO Take by mouth.     No current facility-administered medications for this visit.    Allergies:   Etanercept, Methotrexate derivatives, and Pregabalin   Social History:  The patient  reports that she has never smoked. She has never used smokeless tobacco. She reports that she does not drink alcohol and does not use drugs.   Family History:  The patient's family history includes Asthma in her son; Breast cancer in an other family member; Diabetes in her mother and another family member; Eczema in her son; Glaucoma in her father; Heart disease in her maternal grandmother; Hypertension in her mother, sister, and sister; Rheum arthritis in her maternal grandmother; Sinusitis in her daughter; Stomach cancer (age of onset: 84) in her maternal grandmother; Thyroid disease in her sister and sister.  ROS:  Please see the history of present illness.   All other systems are reviewed and otherwise negative.   PHYSICAL EXAM: VS:  LMP 05/01/2016  BMI: There is no height or weight on file to calculate BMI.  A recheck manually of her BP isi 156/70 (seated) Well nourished, well developed, in no acute distress  HEENT: normocephalic, atraumatic  Neck: no  JVD, very soft L bruit Cardiac: RRR; bradycardic, no significant murmurs, no rubs, or gallops Lungs:  CTA b/l, no wheezing, rhonchi or rales  Abd: soft, nontender MS: no deformity or atrophy Ext: no edema  Skin: warm and dry, no rash Neuro:  No gross deficits appreciated Psych: euthymic mood, full affect    EKG:  Done today and reviewed by myself SB 45bpm, normal intervals  02/15/2019: EST  Blood pressure demonstrated a hypertensive response to exercise.  No T wave inversion was noted during stress.  Upsloping ST segment depression ST segment depression of 1 mm was noted during stress in the II, III, aVF, V6, V5 and V4 leads, and returning to baseline after 1-5 minutes of recovery.  The patient experienced no angina during the stress test  Blood pressure demonstrated a hypertensive response  to exercise  Overall, the patient's exercise capacity was excellent  [facility] Treadmill Score: low risk   Negative stress test without evidence of ischemia at given workload. Hypertensive response to exercise. Excellent exercise capacity - 13.4 METS.    05/07/16: TTE Summary Normal left ventricular size and systolic function with no appreciable segmental abnormality. Ejection fraction is visually estimated at 55-60% Structurally normal mitral valve with good mobility. Trace mitral regurgitation. Mild tricuspid regurgitation   03/20/14: TTE Study Conclusions - Left ventricle: The cavity size was normal. Wall thickness was normal. Systolic function was low normal to mildly reduced. The estimated ejection fraction was in the range of 50% to 55%. Wall motion was normal; there were no regional wall motion abnormalities. Features are consistent with a pseudonormal left ventricular filling pattern, with concomitant abnormal relaxation and increased filling pressure (grade 2 diastolic dysfunction). - Aortic valve: There was trivial regurgitation. - Mitral valve: There was mild  regurgitation. - Right ventricle: The cavity size was normal. Systolic function was normal. - Atrial septum: No residual ASD noted. - Tricuspid valve: There was mild-moderate regurgitation. Peak RV-RA gradient (S): 36 mm Hg. - Pulmonary arteries: PA peak pressure: 44 mm Hg (S). - Systemic veins: IVC measured 2.2 cm with > 50% respirophasic variation, suggesting RA pressure 8 mmHg. Impressions: - The patient was profoundly bradycardic during this study. Normal LV size with low normal to mildly decreased systolic function, EF 24-58%. Moderate diastolic dysfunction. Normal RV size and systolic function. Mild to moderate TR. Mild pulmonary hypertension.  Recent Labs: 02/15/2020: ALT 13; BUN 14; Creat 0.85; Hemoglobin 12.5; Platelets 139; Potassium 4.2; Sodium 140 05/28/2020: TSH 4.07  No results found for requested labs within last 8760 hours.   CrCl cannot be calculated (Patient's most recent lab result is older than the maximum 21 days allowed.).   Wt Readings from Last 3 Encounters:  06/07/20 126 lb 6.4 oz (57.3 kg)  05/28/20 128 lb 2 oz (58.1 kg)  02/15/20 129 lb 3.2 oz (58.6 kg)     Other studies reviewed: Additional studies/records reviewed today include: summarized above including [place] record  ASSESSMENT AND PLAN:  1. SN dysfunction     No symptoms of bradycardia  2. Hx of ASD repair     Pt follows prophylaxis regime, her dentis prescribes this (amoxil 2g prior to dental work)  3. HTN     Looks good, no changes today   Disposition: will see her back in 3-4 weeks, sooner if needed.  I will discuss with Dr. Caryl Comes.  Current medicines are reviewed at length with the patient today.  The patient did not have any concerns regarding medicines.  Haywood Lasso, PA-C 06/07/2020 3:50 PM     [address] [place] [address] [phone] (office)  [phone] (fax)

## 2020-06-07 NOTE — Patient Instructions (Signed)
Get your chest x-ray downstairs  use a heating pad on the right side of the parotid gland  get some lemon drops or lemon candy and use it 3-4 times a day   okay to take Tylenol and ibuprofen as needed  see Dr. Lucia Gaskins, ENT as already scheduled  if you are not getting better gradually or if you get worse: Start the antibiotic called Augmentin  please see your dentist.

## 2020-06-08 NOTE — Assessment & Plan Note (Signed)
Parotitis: Otalgia likely related to mild inflammation of the right parotid gland.  She already has an appointment with ENT.  A formal referral sent. Plan: Warm compress, lemon drops, Tylenol/Advil as needed.  If she is not better start an antibiotic, see AVS.  Keep ENT appointment. Dental pain: Right molar is TTP with percussion, see physical exam, rec to see her dentist.  No obvious abscess on exam. Dysphagia: Unclear what she has pain when she swallows, see description in HPI.  For completeness we will get a chest x-ray.

## 2020-06-12 ENCOUNTER — Other Ambulatory Visit: Payer: Self-pay

## 2020-06-12 ENCOUNTER — Ambulatory Visit: Payer: 59 | Admitting: Physician Assistant

## 2020-06-12 ENCOUNTER — Encounter: Payer: Self-pay | Admitting: Physician Assistant

## 2020-06-12 VITALS — BP 124/70 | HR 45 | Ht 69.0 in | Wt 126.8 lb

## 2020-06-12 DIAGNOSIS — R001 Bradycardia, unspecified: Secondary | ICD-10-CM | POA: Diagnosis not present

## 2020-06-12 DIAGNOSIS — R0989 Other specified symptoms and signs involving the circulatory and respiratory systems: Secondary | ICD-10-CM | POA: Diagnosis not present

## 2020-06-12 DIAGNOSIS — I10 Essential (primary) hypertension: Secondary | ICD-10-CM

## 2020-06-12 DIAGNOSIS — Z8774 Personal history of (corrected) congenital malformations of heart and circulatory system: Secondary | ICD-10-CM

## 2020-06-12 NOTE — Patient Instructions (Signed)
Medication Instructions:    Your physician recommends that you continue on your current medications as directed. Please refer to the Current Medication list given to you today.  *If you need a refill on your cardiac medications before your next appointment, please call your pharmacy*   Lab Work: [place]   If you have labs (blood work) drawn today and your tests are completely normal, you will receive your results only by: Marland Kitchen MyChart Message (if you have MyChart) OR . A paper copy in the mail If you have any lab test that is abnormal or we need to change your treatment, we will call you to review the results.   Testing/Procedures: Your physician has requested that you have a carotid duplex. This test is an ultrasound of the carotid arteries in your neck. It looks at blood flow through these arteries that supply the brain with blood. Allow one hour for this exam. There are no restrictions or special instructions.   Follow-Up: At [facility], you and your health needs are our priority.  As part of our continuing mission to provide you with exceptional heart care, we have created designated Provider Care Teams.  These Care Teams include your primary Cardiologist (physician) and Advanced Practice Providers (APPs -  Physician Assistants and Nurse Practitioners) who all work together to provide you with the care you need, when you need it.  We recommend signing up for the patient portal called "MyChart".  Sign up information is provided on this After Visit Summary.  MyChart is used to connect with patients for Virtual Visits (Telemedicine).  Patients are able to view lab/test results, encounter notes, upcoming appointments, etc.  Non-urgent messages can be sent to your provider as well.   To learn more about what you can do with MyChart, go to [email or web address].    Your next appointment:   1 year(s)  The format for your next appointment:   In Person  Provider:   You may  see Dr. Caryl Comes  or one of the following Advanced Practice Providers on your designated Care Team:    Chanetta Marshall, NP  Tommye Standard, PA-C  Legrand Como "Oda [place], [place]    Other Instructions

## 2020-06-18 ENCOUNTER — Encounter: Payer: Self-pay | Admitting: Internal Medicine

## 2020-06-18 ENCOUNTER — Ambulatory Visit (INDEPENDENT_AMBULATORY_CARE_PROVIDER_SITE_OTHER): Payer: 59 | Admitting: Internal Medicine

## 2020-06-18 ENCOUNTER — Other Ambulatory Visit: Payer: Self-pay

## 2020-06-18 VITALS — BP 140/78 | HR 46 | Resp 16 | Ht 69.0 in | Wt 128.0 lb

## 2020-06-18 DIAGNOSIS — Z0000 Encounter for general adult medical examination without abnormal findings: Secondary | ICD-10-CM | POA: Diagnosis not present

## 2020-06-18 DIAGNOSIS — R739 Hyperglycemia, unspecified: Secondary | ICD-10-CM | POA: Diagnosis not present

## 2020-06-18 DIAGNOSIS — I10 Essential (primary) hypertension: Secondary | ICD-10-CM | POA: Diagnosis not present

## 2020-06-18 DIAGNOSIS — E039 Hypothyroidism, unspecified: Secondary | ICD-10-CM | POA: Diagnosis not present

## 2020-06-18 NOTE — Progress Notes (Signed)
Subjective:    Patient ID: Brittany Hodges, female    DOB: 02/29/1966, 55 y.o.   MRN: [id number]  DOS:  06/18/2020 Type of visit - description: CPX  Was seen recently with parotitis, feeling better.   Review of Systems  Other than above, a 14 point review of systems is negative      Past Medical History:  Diagnosis Date  . Allergic rhinitis, cause unspecified   . Anemia   . Asthma   . Atrial septal defect    s/p repair;   Echo 08/07/10: EF 55-60%; mild MR; atrial septal aneurysm; no residual ASD  . Chest pain, unspecified    cardiac cath 07/31/10: normal coronaries; vigorous LVF  . Esophageal reflux   . Fibromyalgia   . Headache(784.0)   . Hematuria, unspecified   . Hypertension    no meds   . Hypothyroidism   . Irritable bowel syndrome   . Low back pain   . Rheumatoid arthritis(714.0)    Dr Herold Harms  . Scoliosis   . Sinoatrial node dysfunction (HCC)    eval. for chronotropic competence completed in past    Past Surgical History:  Procedure Laterality Date  . ASD REPAIR  1985  . TONSILLECTOMY AND ADENOIDECTOMY    . TUBAL LIGATION      Allergies as of 06/18/2020      Reactions   Etanercept    Methotrexate Derivatives    Caused her WBC to drop   Pregabalin       Medication List       Accurate as of June 18, 2020 11:59 PM. If you have any questions, ask your nurse or doctor.        AeroChamber MV inhaler Use as instructed   albuterol 108 (90 Base) MCG/ACT inhaler Commonly known as: ProAir HFA Inhale 2 puffs into the lungs every 6 (six) hours as needed. For shortness of breath and wheezing   amLODipine 5 MG tablet Commonly known as: NORVASC Take 1 tablet (5 mg total) by mouth daily.   azelastine 0.1 % nasal spray Commonly known as: ASTELIN 1-2 sprays per nostril twice daily as needed for runny nose.   BLACK CURRANT SEED OIL PO Take by mouth daily.   fluticasone 50 MCG/ACT nasal spray Commonly known as: FLONASE Place 2 sprays into  both nostrils daily as needed.   GINGER ROOT PO Take by mouth.   prednisoLONE acetate 1 % ophthalmic suspension Commonly known as: PRED FORTE Place 1 drop into both eyes daily.   TART CHERRY ADVANCED PO Take by mouth.   TURMERIC PO Take by mouth.          Objective:   Physical Exam BP 140/78 (BP Location: Right Arm, Patient Position: Sitting, Cuff Size: Normal)   Pulse (!) 46   Resp 16   Ht 5\' 9"  (1.753 m)   Wt 128 lb (58.1 kg)   LMP 05/01/2016   SpO2 100%   BMI 18.90 kg/m  General: Well developed, NAD, BMI noted Neck: No  thyromegaly. HEENT:  Normocephalic . Face symmetric, atraumatic. Minimal swelling of the right parotid, no TTP. Lungs:  CTA B Normal respiratory effort, no intercostal retractions, no accessory muscle use. Heart: RRR,  no murmur.  Abdomen:  Not distended, soft, non-tender. No rebound or rigidity.   Lower extremities: no pretibial edema bilaterally  Skin: Exposed areas without rash. Not pale. Not jaundice Neurologic:  alert & oriented X3.  Speech normal, gait appropriate for age and unassisted Strength  symmetric and appropriate for age.  Psych: Cognition and judgment appear intact.  Cooperative with normal attention span and concentration.  Behavior appropriate. No anxious or depressed appearing.     Assessment      Assessment   Prediabetes: A1c 6.0 (01-2017) HTN H/o Hypothyroidism (took meds at some point) GERD- dysphagia -Dr. Norville Haggard EGD from 04/02/2014 (Dr Delrae Alfred): Narrowing of proximal esophagus, question of extrinsic extrinsic compression, EUS showing no intrinsic esophageal mass but the spine was seen in close proximity to the esophagus probably resulting in extrinsic compression. The endoscopist recommend repeat CT neck and chest in 3 months.saw ENT 04-2015 for dysphagia,had a scope,  rx PPI Asthma, uses alb rarely  CV: --Atrial septal defect: Status post repair, no residual ASD: Needs ABX prophylaxis  preprocedures --Sinoatrial  Dysfunction, sees Dr Caryl Comes   --CP: cath 2012, normal coronaries MSK: --Rheumatoid arthritis,   Dr. Herold Harms + rheumatoid factor +CCP,   h/o  low WBC with Methotrexate,   injection site reaction to Enbrel  , self d/c Humira ~ 12/2018 --Osteopenia: T score -1.7) 05/27/2018) IBS (Miralax prn constipation)   Admission: syncope 04-2016 (unlikely to be arrhythmia related per cards ) History of fibromyalgia   PLAN:  Here for CPX Prediabetes: Concept of A1c discussed, check labs. HTN: On amlodipine, BP satisfactory, no change. History of hypothyroidism: On no meds, recheck on RTC Cardiovascular: Saw cardiology 06/12/2020: Felt to be stable. Osteopenia: Not on supplements, recommend calcium and vitamin D. Parotitis, dental pain: Improving, plans to see ENT and a  dentist. RTC 6 months  This visit occurred during the SARS-CoV-2 public health emergency.  Safety protocols were in place, including screening questions prior to the visit, additional usage of staff PPE, and extensive cleaning of exam room while observing appropriate contact time as indicated for disinfecting solutions.

## 2020-06-18 NOTE — Patient Instructions (Addendum)
Check the  blood pressure   BP GOAL is between 110/65 and  135/85. If it is consistently higher or lower, let me know    GO TO THE LAB : Get the blood work     [place], [place] back for a check up in 6 months      LEARN ABOUT CALCIUM AND VITAMIN D  Calcium and Vitamin D are important to help keep your bones healthy and prevent osteoporosis. Healthy calcium and Vitamin D levels can be reached by increasing calcium and vitamin D in your diet, or by taking supplements over the counter.  The current recommendations are as follows:  Increasing calcium and vitamin D is generally well tolerated in most people. Some people may get constipated (with CALCIUM).   RECCOMENDATIONS -Postmenopausal women:    1000mg  calcium and 1000 IU   vitamin D daily   Below are some examples of foods high in calcium and vitamin D:  Foods with Vitamin D FOOD Vitamin D Amount  3 oz Salmon 380-570 IU  8 oz Milk (nonfat, reduced fat, and whole) 100 IU  6 oz Yogurt, fortified with vitamin D 80 IU  8 oz Orange Juice, fortified with vitamin D 100 IU  1 Egg 25 IU   Foods with Calcium FOOD Calcium Amount  1 cup of milk 300 mg  1 cup of yogurt 450 mg  1 oz cheese 200 mg  1 cup of spinach 240 mg  8 oz orange juice, fortified with calcium 300 mg

## 2020-06-19 ENCOUNTER — Encounter (INDEPENDENT_AMBULATORY_CARE_PROVIDER_SITE_OTHER): Payer: Self-pay | Admitting: Otolaryngology

## 2020-06-19 ENCOUNTER — Ambulatory Visit (INDEPENDENT_AMBULATORY_CARE_PROVIDER_SITE_OTHER): Payer: 59 | Admitting: Otolaryngology

## 2020-06-19 ENCOUNTER — Encounter: Payer: Self-pay | Admitting: Internal Medicine

## 2020-06-19 VITALS — Temp 97.5°F

## 2020-06-19 DIAGNOSIS — M26609 Unspecified temporomandibular joint disorder, unspecified side: Secondary | ICD-10-CM

## 2020-06-19 DIAGNOSIS — H9041 Sensorineural hearing loss, unilateral, right ear, with unrestricted hearing on the contralateral side: Secondary | ICD-10-CM

## 2020-06-19 DIAGNOSIS — Z8719 Personal history of other diseases of the digestive system: Secondary | ICD-10-CM | POA: Diagnosis not present

## 2020-06-19 DIAGNOSIS — J310 Chronic rhinitis: Secondary | ICD-10-CM | POA: Diagnosis not present

## 2020-06-19 LAB — CBC WITH DIFFERENTIAL/PLATELET
Basophils Absolute: 0 K/uL (ref 0.0–0.1)
Basophils Relative: 0.9 % (ref 0.0–3.0)
Eosinophils Absolute: 0.1 K/uL (ref 0.0–0.7)
Eosinophils Relative: 1.4 % (ref 0.0–5.0)
HCT: 38.8 % (ref 36.0–46.0)
Hemoglobin: 12.5 g/dL (ref 12.0–15.0)
Lymphocytes Relative: 35.1 % (ref 12.0–46.0)
Lymphs Abs: 1.3 K/uL (ref 0.7–4.0)
MCHC: 32.3 g/dL (ref 30.0–36.0)
MCV: 88.1 fl (ref 78.0–100.0)
Monocytes Absolute: 0.4 K/uL (ref 0.1–1.0)
Monocytes Relative: 11.7 % (ref 3.0–12.0)
Neutro Abs: 2 K/uL (ref 1.4–7.7)
Neutrophils Relative %: 50.9 % (ref 43.0–77.0)
Platelets: 145 K/uL — ABNORMAL LOW (ref 150.0–400.0)
RBC: 4.41 Mil/uL (ref 3.87–5.11)
RDW: 13.1 % (ref 11.5–15.5)
WBC: 3.8 K/uL — ABNORMAL LOW (ref 4.0–10.5)

## 2020-06-19 LAB — COMPREHENSIVE METABOLIC PANEL
ALT: 26 U/L (ref 0–35)
AST: 30 U/L (ref 0–37)
Albumin: 4.2 g/dL (ref 3.5–5.2)
Alkaline Phosphatase: 71 U/L (ref 39–117)
BUN: 15 mg/dL (ref 6–23)
CO2: 29 mEq/L (ref 19–32)
Calcium: 10.1 mg/dL (ref 8.4–10.5)
Chloride: 103 mEq/L (ref 96–112)
Creatinine, Ser: 0.83 mg/dL (ref 0.40–1.20)
GFR: 78.73 mL/min (ref >60.00)
Glucose, Bld: 75 mg/dL (ref 70–99)
Potassium: 4.3 mEq/L (ref 3.5–5.1)
Sodium: 138 mEq/L (ref 135–145)
Total Bilirubin: 0.5 mg/dL (ref 0.2–1.2)
Total Protein: 7.8 g/dL (ref 6.0–8.3)

## 2020-06-19 LAB — LIPID PANEL
Cholesterol: 134 mg/dL (ref 0–200)
HDL: 56 mg/dL (ref >39.00)
LDL Cholesterol: 66 mg/dL (ref 0–99)
NonHDL: 77.94
Total CHOL/HDL Ratio: 2
Triglycerides: 59 mg/dL (ref 0.0–149.0)
VLDL: 11.8 mg/dL (ref 0.0–40.0)

## 2020-06-19 LAB — HEMOGLOBIN A1C: Hgb A1c MFr Bld: 6.1 % (ref 4.6–6.5)

## 2020-06-19 LAB — MAGNESIUM: Magnesium: 1.8 mg/dL (ref 1.5–2.5)

## 2020-06-19 NOTE — Assessment & Plan Note (Signed)
Here for CPX Prediabetes: Concept of A1c discussed, check labs. HTN: On amlodipine, BP satisfactory, no change. History of hypothyroidism: On no meds, recheck on RTC Cardiovascular: Saw cardiology 06/12/2020: Felt to be stable. Osteopenia: Not on supplements, recommend calcium and vitamin D. Parotitis, dental pain: Improving, plans to see ENT and a  dentist. RTC 6 months

## 2020-06-19 NOTE — Progress Notes (Signed)
HPI: Brittany Hodges is a 55 y.o. female who presents is referred by her PCP Dr. Larose Kells for evaluation of right ear complaints as well as intermittent swelling on the right side of her neck in front of the ear that initially started back in October.  She has been treated with 1 round of antibiotics as well as suggested using sialagogues to help promote salivation.  She is having no pain today in the region of the swelling.  However she does complain of pain and discomfort in the right ear especially at night.  She has history of TMJ problems and has a splint made by her dentist to help with TMJ problems. 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 She also has nasal symptoms with congestion in the nose.Marland Kitchen  Past Medical History:  Diagnosis Date  . Allergic rhinitis, cause unspecified   . Anemia   . Asthma   . Atrial septal defect    s/p repair;   Echo 08/07/10: EF 55-60%; mild MR; atrial septal aneurysm; no residual ASD  . Chest pain, unspecified    cardiac cath 07/31/10: normal coronaries; vigorous LVF  . Esophageal reflux   . Fibromyalgia   . Headache(784.0)   . Hematuria, unspecified   . Hypertension    no meds   . Hypothyroidism   . Irritable bowel syndrome   . Low back pain   . Rheumatoid arthritis(714.0)    Dr Herold Harms  . Scoliosis   . Sinoatrial node dysfunction (HCC)    eval. for chronotropic competence completed in past   Past Surgical History:  Procedure Laterality Date  . ASD REPAIR  1985  . TONSILLECTOMY AND ADENOIDECTOMY    . TUBAL LIGATION     Social History   Socioeconomic History  . Marital status: Single    Spouse name: Not on file  . Number of children: 3  . Years of education: Not on file  . Highest education level: Not on file   Occupational History  . Occupation: HR assistant   Tobacco Use  . Smoking status: Never Smoker  . Smokeless tobacco: Never Used  . Tobacco comment: never used tobacco  Vaping Use  . Vaping Use: Never used  Substance and Sexual Activity  . Alcohol use: No  . Drug use: No  . Sexual activity: Not Currently    Birth control/protection: None, Surgical  Other Topics Concern  . Not on file  Social History Narrative   Household-- lives by herself   Social Determinants of Health   Financial Resource Strain: Not on file  Food Insecurity: Not on file  Transportation Needs: Not on file  Physical Activity: Not on file  Stress: Not on file  Social Connections: Not on file   Family History  Problem Relation Age of Onset  . Hypertension Mother   . Diabetes Mother   . Heart disease Maternal Grandmother   . Stomach cancer Maternal Grandmother 95  . Rheum arthritis Maternal Grandmother   . Glaucoma Father   . Breast cancer Other        aunt  . Diabetes Other        GM  . Hypertension Sister   . Thyroid disease Sister   . Hypertension Sister   .  Thyroid disease Sister   . Asthma Son   . Eczema Son   . Sinusitis Daughter   . Colon cancer Neg Hx   . Allergic rhinitis Neg Hx   . Angioedema Neg Hx   . Immunodeficiency Neg Hx    Allergies  Allergen Reactions  . Etanercept   . Methotrexate Derivatives     Caused her WBC to drop  . Pregabalin    Prior to Admission medications   Medication Sig Start Date End Date Taking? Authorizing Provider  albuterol (PROAIR HFA) 108 (90 Base) MCG/ACT inhaler Inhale 2 puffs into the lungs every 6 (six) hours as needed. For shortness of breath and wheezing 04/06/16   Noralee Space, MD  amLODipine (NORVASC) 5 MG tablet Take 1 tablet (5 mg total) by mouth daily. 03/22/20   Colon Branch, MD  azelastine (ASTELIN) 0.1 % nasal spray 1-2 sprays per nostril twice daily as needed for runny nose. 09/14/19   Bobbitt, [place] Muta, MD  BLACK CURRANT SEED OIL  PO Take by mouth daily.    [provider]  fluticasone (FLONASE) 50 MCG/ACT nasal spray Place 2 sprays into both nostrils daily as needed. 03/23/19   Colon Branch, MD  Ginger, Zingiber officinalis, (GINGER ROOT PO) Take by mouth.    [provider]  Misc Natural Products (TART CHERRY ADVANCED PO) Take by mouth.    [provider]  prednisoLONE acetate (PRED FORTE) 1 % ophthalmic suspension Place 1 drop into both eyes daily.  06/01/18   [provider]  Spacer/Aero-Holding Chambers (AEROCHAMBER MV) inhaler Use as instructed 09/15/12   Noralee Space, MD  TURMERIC PO Take by mouth.    [provider]     Positive ROS: Otherwise negative  All other systems have been reviewed and were otherwise negative with the exception of those mentioned in the HPI and as above.  Physical Exam: Constitutional: Alert, well-appearing, no acute distress Ears: External ears without lesions or tenderness. Ear canals with minimal wax buildup that was cleaned in the office.  Otherwise the TMs are clear bilaterally with good mobility on pneumatic otoscopy.  No middle ear effusion noted on either side.  On tuning fork testing Weber lateralized to the left side.  Subjectively she heard better in the left ear compared to the right but [facility] was greater than BC bilaterally. Nasal: External nose without lesions. Septum with mild deformity but moderate rhinitis with mostly clear mucus discharge.  She has generous sized turbinates.  After decongesting the nose both millimeters regions were clear with no obvious mucopurulent discharge.. Oral: Lips and gums without lesions. Tongue and palate mucosa without lesions. Posterior oropharynx clear.  The drainage from the parotid duct on the right side was clear. Neck: No palpable adenopathy or masses.  She did have slight enlargement of the right parotid gland compared to the left parotid gland.  No discrete masses noted.  The parotid gland was not  sensitive or tender to palpation.  Of note she did have pain in the right TMJ area on opening her mouth. Respiratory: Breathing comfortably  Skin: No facial/neck lesions or rash noted.  Procedures  Assessment: History of parotid gland swelling on the right side.  Slightly enlarged on exam in the office today but no signs of active infection. Right ear sensorineural hearing loss from noise exposure in the past. Clear TMs otherwise. Chronic rhinitis.  Plan: Placed her on Nasacort 2 sprays each nostril at night on a regular basis over  the next couple months as this should help with her nasal sinus symptoms. She will follow up here in 2 to 3 months for recheck and audiologic testing to evaluate and document right ear sensorineural hearing loss. I gave her a prescription for Keflex 500 g 3 times daily for 1 week to take if she develops any acute swelling or pain of the right parotid gland.  She will follow up here earlier if she notices any acute swelling of the gland as needed She will follow-up with her dentist concerning further treatment of her TMJ problem.   Radene Journey, MD   CC:

## 2020-06-19 NOTE — Assessment & Plan Note (Signed)
-   Td 2012  - Pneumovax 2016;  prevnar 01-2016 - shingrix discussed before. -COVID VAX x 2, rec booster  - flu shot: Declined -Female care:per  Gynecology,  rec to get an appointment, [facility] 09/2019 -CCS: Cscope 05/07/2011 negative,Cscope 09/2019 next per GI  - Diet and exercise: Doing great, exercise 5-6 times a week - labs: CMP, FLP, CBC, A1c.  Also request magnesium and zinc.

## 2020-06-20 ENCOUNTER — Other Ambulatory Visit: Payer: Self-pay

## 2020-06-20 ENCOUNTER — Ambulatory Visit (HOSPITAL_COMMUNITY)
Admission: RE | Admit: 2020-06-20 | Discharge: 2020-06-20 | Disposition: A | Discharge status: Home / Self Care | Payer: 59 | Source: Ambulatory Visit | Attending: Cardiovascular Disease | Admitting: Cardiovascular Disease

## 2020-06-20 DIAGNOSIS — R0989 Other specified symptoms and signs involving the circulatory and respiratory systems: Secondary | ICD-10-CM | POA: Insufficient documentation

## 2020-06-20 LAB — ZINC: Zinc: 67 ug/dL (ref 60–130)

## 2020-06-24 ENCOUNTER — Encounter: Payer: Self-pay | Admitting: Obstetrics and Gynecology

## 2020-06-24 ENCOUNTER — Other Ambulatory Visit (HOSPITAL_COMMUNITY)
Admission: RE | Admit: 2020-06-24 | Discharge: 2020-06-24 | Disposition: A | Discharge status: Home / Self Care | Payer: 59 | Source: Ambulatory Visit | Attending: Obstetrics and Gynecology | Admitting: Obstetrics and Gynecology

## 2020-06-24 ENCOUNTER — Ambulatory Visit (INDEPENDENT_AMBULATORY_CARE_PROVIDER_SITE_OTHER): Payer: 59 | Admitting: Obstetrics and Gynecology

## 2020-06-24 ENCOUNTER — Other Ambulatory Visit: Payer: Self-pay

## 2020-06-24 VITALS — BP 130/83 | HR 64 | Ht 69.0 in | Wt 128.0 lb

## 2020-06-24 DIAGNOSIS — Z124 Encounter for screening for malignant neoplasm of cervix: Secondary | ICD-10-CM

## 2020-06-24 DIAGNOSIS — Z01419 Encounter for gynecological examination (general) (routine) without abnormal findings: Secondary | ICD-10-CM

## 2020-06-24 NOTE — Progress Notes (Signed)
GYNECOLOGY ANNUAL PREVENTATIVE CARE ENCOUNTER NOTE  Subjective:   Brittany Hodges is a 55 y.o. [phone] female here for a annual gynecologic exam. Current complaints: none, needs pap.    Denies abnormal vaginal bleeding, discharge, pelvic pain, problems with intercourse or other gynecologic concerns. Declines STI screen.   Gynecologic History Patient's last menstrual period was 05/01/2016. Contraception: post menopausal status Last Pap: several years ago. Results: normal Last mammogram: 09/2019. Results: normal DEXA: has had, unsure when  Obstetric History OB History  Gravida Para Term Preterm AB Living  5 3 3   2 2   SAB IAB Ectopic Multiple Live Births          2    # Outcome Date GA Lbr Len/2nd Weight Sex Delivery Anes PTL Lv  5 Term 1995 [redacted]w[redacted]d   M Vag-Spont None N   4 Term 1992 [redacted]w[redacted]d   M Vag-Spont None N LIV  3 AB 1991          2 AB 1990          1 Term 1988 [redacted]w[redacted]d   F Vag-Spont None N LIV    Past Medical History:  Diagnosis Date  . Allergic rhinitis, cause unspecified   . Anemia   . Asthma   . Atrial septal defect    s/p repair;   Echo 08/07/10: EF 55-60%; mild MR; atrial septal aneurysm; no residual ASD  . Chest pain, unspecified    cardiac cath 07/31/10: normal coronaries; vigorous LVF  . Esophageal reflux   . Fibromyalgia   . Headache(784.0)   . Hematuria, unspecified   . Hypertension    no meds   . Hypothyroidism   . Irritable bowel syndrome   . Low back pain   . Rheumatoid arthritis(714.0)    Dr Herold Harms  . Scoliosis   . Sinoatrial node dysfunction (HCC)    eval. for chronotropic competence completed in past    Past Surgical History:  Procedure Laterality Date  . ASD REPAIR  1985  . TONSILLECTOMY AND ADENOIDECTOMY    . TUBAL LIGATION      Current Outpatient Medications on File Prior to Visit  Medication Sig Dispense Refill  . albuterol (PROAIR HFA) 108 (90 Base) MCG/ACT inhaler Inhale 2 puffs into the lungs every 6 (six) hours as needed.  For shortness of breath and wheezing 1 Inhaler 6  . amLODipine (NORVASC) 5 MG tablet Take 1 tablet (5 mg total) by mouth daily. 90 tablet 0  . amoxicillin (AMOXIL) 500 MG capsule Take 500 mg by mouth 3 (three) times daily.    Marland Kitchen azelastine (ASTELIN) 0.1 % nasal spray 1-2 sprays per nostril twice daily as needed for runny nose. 30 mL 5  . BLACK CURRANT SEED OIL PO Take by mouth daily.    . fluticasone (FLONASE) 50 MCG/ACT nasal spray Place 2 sprays into both nostrils daily as needed. 16 g 6  . Ginger, Zingiber officinalis, (GINGER ROOT PO) Take by mouth.    . Misc Natural Products (TART CHERRY ADVANCED PO) Take by mouth.    . prednisoLONE acetate (PRED FORTE) 1 % ophthalmic suspension Place 1 drop into both eyes daily.     Marland Kitchen Spacer/Aero-Holding Chambers (AEROCHAMBER MV) inhaler Use as instructed 1 each 0  . TURMERIC PO Take by mouth.     No current facility-administered medications on file prior to visit.    Allergies  Allergen Reactions  . Etanercept   . Methotrexate Derivatives     Caused her  WBC to drop  . Pregabalin     Social History   Socioeconomic History  . Marital status: Single    Spouse name: Not on file  . Number of children: 3  . Years of education: Not on file  . Highest education level: Not on file  Occupational History  . Occupation: HR assistant   Tobacco Use  . Smoking status: Never Smoker  . Smokeless tobacco: Never Used  . Tobacco comment: never used tobacco  Vaping Use  . Vaping Use: Never used  Substance and Sexual Activity  . Alcohol use: No  . Drug use: No  . Sexual activity: Not Currently    Birth control/protection: None, Surgical  Other Topics Concern  . Not on file  Social History Narrative   Household-- lives by herself   Social Determinants of Health   Financial Resource Strain: Not on file  Food Insecurity: Not on file  Transportation Needs: Not on file  Physical Activity: Not on file  Stress: Not on file  Social Connections: Not on  file  Intimate Partner Violence: Not on file    Family History  Problem Relation Age of Onset  . Hypertension Mother   . Diabetes Mother   . Heart disease Maternal Grandmother   . Stomach cancer Maternal Grandmother 95  . Rheum arthritis Maternal Grandmother   . Glaucoma Father   . Breast cancer Other        aunt  . Diabetes Other        GM  . Hypertension Sister   . Thyroid disease Sister   . Hypertension Sister   . Thyroid disease Sister   . Asthma Son   . Eczema Son   . Sinusitis Daughter   . Colon cancer Neg Hx   . Allergic rhinitis Neg Hx   . Angioedema Neg Hx   . Immunodeficiency Neg Hx     The following portions of the patient's history were reviewed and updated as appropriate: allergies, current medications, past family history, past medical history, past social history, past surgical history and problem list.  Review of Systems Pertinent items are noted in HPI.   Objective:  BP 130/83   Pulse 64   Ht 5\' 9"  (1.753 m)   Wt 128 lb (58.1 kg)   LMP 05/01/2016   BMI 18.90 kg/m  CONSTITUTIONAL: Well-developed, well-nourished female in no acute distress.  HENT:  Normocephalic, atraumatic, External right and left ear normal. Oropharynx is clear and moist EYES: Conjunctivae and EOM are normal. Pupils are equal, round, and reactive to light. No scleral icterus.  NECK: Normal range of motion, supple, no masses.  Normal thyroid.  SKIN: Skin is warm and dry. No rash noted. Not diaphoretic. No erythema. No pallor. NEUROLOGIC: Alert and oriented to person, place, and time. Normal reflexes, muscle tone coordination. No cranial nerve deficit noted. PSYCHIATRIC: Normal mood and affect. Normal behavior. Normal judgment and thought content. CARDIOVASCULAR: Normal heart rate noted RESPIRATORY: Effort normal, no problems with respiration noted. BREASTS: Symmetric in size. No masses, skin changes, nipple drainage, or lymphadenopathy. ABDOMEN: Soft, no distention noted.  No  tenderness, rebound or guarding.  PELVIC: Normal appearing external genitalia; normal appearing vaginal mucosa and cervix.  No abnormal discharge noted.  Pap smear obtained. Normal uterine size, no other palpable masses, no uterine or adnexal tenderness. MUSCULOSKELETAL: Normal range of motion. No tenderness.  No cyanosis, clubbing, or edema.  2+ distal pulses.  Exam done with chaperone present.  FRAX score done 06/24/20:  N/a - had DEXA 2020  Assessment and Plan:   1. Well woman exam Healthy female exam No issues  2. Cervical cancer screening - Cytology - PAP( [place])   Will follow up results of pap smear and manage accordingly. Encouraged improvement in diet and exercise.  COVID vaccine UTD Declines STI screen. Mammogram UTD Referral for colonoscopy n/a - UTD Flu vaccine declines DEXA not due - UTD 2020  Routine preventative health maintenance measures emphasized. Please refer to After Visit Summary for other counseling recommendations.    Feliz Beam, MD, [place] for [employer] [facility])

## 2020-06-26 LAB — CYTOLOGY - PAP
Comment: NEGATIVE
Diagnosis: NEGATIVE
High risk HPV: NEGATIVE

## 2020-07-05 NOTE — Progress Notes (Signed)
Office Visit Note  Patient: Brittany Hodges             Date of Birth: 1966/02/26           MRN: [id number]             PCP: Colon Branch, MD Referring: Colon Branch, MD Visit Date: 07/19/2020 Occupation: @GUAROCC @  Subjective:  No chief complaint on file.   History of Present Illness: Brittany Hodges is a 55 y.o. female with a history of seropositive rheumatoid arthritis, osteoarthritis, and iritis. She does not take any immunosuppressive agents at this time. She is taking natural anti-inflammatories to manage her symptoms. She has not had any flares of joint swelling, pain or stiffness. She denies any SOB. She is remaining active 5-6 days per week. She does cardio, resistance exercises and yoga. She is still seeing her chiropractor and her crepitus in her neck has improved. She denies any back pain. She rates her RA as a 2/10. She has occasional aches and pain but nothing being significant. She notes she recently hit her right foot on a box while walking and has had some pain in her foot.  She uses prednisolone acetate drops to manage her iritis symptoms. She has had several flares of her iritis since her last visit. She is monitored by Dr. Teodoro Spray, O.D. She saw him about a month ago. She was placed tapering abx drops and was told to follow up if it was not better. She reports her symptoms have improved since she saw him last. She has received two doses of the Covid-19 vaccinations. She has not received her Shingrix vaccination.   Activities of Daily Living:  Patient reports morning stiffness for 0 minutes.   Patient Denies nocturnal pain.  Difficulty dressing/grooming: Denies Difficulty climbing stairs: Denies Difficulty getting out of chair: Denies Difficulty using hands for taps, buttons, cutlery, and/or writing: Denies  Review of Systems  Constitutional: Negative for fatigue.  HENT: Negative for mouth sores, mouth dryness and nose dryness.   Eyes: Positive for itching  and dryness. Negative for pain and visual disturbance.  Respiratory: Negative for cough, hemoptysis, shortness of breath and difficulty breathing.   Cardiovascular: Negative for chest pain, palpitations and swelling in legs/feet.  Gastrointestinal: Negative for abdominal pain, blood in stool, constipation and diarrhea.  Endocrine: Negative for increased urination.  Genitourinary: Negative for painful urination.  Musculoskeletal: Negative for arthralgias, joint pain, joint swelling, myalgias, muscle weakness, morning stiffness, muscle tenderness and myalgias.  Skin: Negative for color change, rash and redness.  Allergic/Immunologic: Negative for susceptible to infections.  Neurological: Negative for dizziness, numbness, headaches, memory loss and weakness.  Hematological: Negative for swollen glands.  Psychiatric/Behavioral: Negative for confusion and sleep disturbance.    PMFS History:  Patient Active Problem List   Diagnosis Date Noted  . Allergic conjunctivitis 09/14/2019  . Chronic sinusitis 09/14/2019  . Intolerance, food 09/14/2019  . Asthmatic bronchitis 04/06/2016  . Laryngopharyngeal reflux (LPR) 04/06/2016  . PCP NOTES >>>>> 03/09/2015  . Annual physical exam 12/21/2014  . Cardiomegaly, by MRI 03/16/2014  . Leukopenia 03/10/2014  . Lightheadedness 02/10/2011  . SINOATRIAL NODE DYSFUNCTION 08/06/2008  . ATRIAL SEPTAL DEFECT 06/29/2007  . Hypothyroidism 03/21/2007  . Essential hypertension 03/21/2007  . Perennial allergic rhinitis 03/21/2007  . GERD-- dysphagia 03/21/2007  . CONSTIPATION 03/21/2007  . IRRITABLE BOWEL SYNDROME 03/21/2007  . RA (rheumatoid arthritis) ([place]) 03/21/2007  . LOW BACK PAIN 03/21/2007  . FIBROMYALGIA 03/21/2007  Past Medical History:  Diagnosis Date  . Allergic rhinitis, cause unspecified   . Anemia   . Asthma   . Atrial septal defect    s/p repair;   Echo 08/07/10: EF 55-60%; mild MR; atrial septal aneurysm; no residual ASD  . Chest  pain, unspecified    cardiac cath 07/31/10: normal coronaries; vigorous LVF  . Esophageal reflux   . Fibromyalgia   . Headache(784.0)   . Hematuria, unspecified   . Hypertension    no meds   . Hypothyroidism   . Irritable bowel syndrome   . Low back pain   . Rheumatoid arthritis(714.0)    Dr Herold Harms  . Scoliosis   . Sinoatrial node dysfunction (HCC)    eval. for chronotropic competence completed in past    Family History  Problem Relation Age of Onset  . Hypertension Mother   . Diabetes Mother   . Kidney failure Mother   . Heart disease Maternal Grandmother   . Stomach cancer Maternal Grandmother 95  . Rheum arthritis Maternal Grandmother   . Glaucoma Father   . Breast cancer Other        aunt  . Diabetes Other        GM  . Hypertension Sister   . Thyroid disease Sister   . Hypertension Sister   . Thyroid disease Sister   . Asthma Son   . Eczema Son   . Sinusitis Daughter   . Colon cancer Neg Hx   . Allergic rhinitis Neg Hx   . Angioedema Neg Hx   . Immunodeficiency Neg Hx    Past Surgical History:  Procedure Laterality Date  . ASD REPAIR  1985  . TONSILLECTOMY AND ADENOIDECTOMY    . TUBAL LIGATION     Social History   Social History Narrative   Household-- lives by herself   Immunization History  Administered Date(s) Administered  . Influenza Split 03/17/2011, 03/17/2012  . Influenza Whole 04/05/2009, 02/21/2010  . Influenza,inj,Quad PF,6+ Mos 03/21/2013, 03/09/2014, 05/24/2015, 01/27/2016  . PFIZER(Purple Top)SARS-COV-2 Vaccination 12/16/2019, 01/16/2020  . Pneumococcal Conjugate-13 01/27/2016  . Pneumococcal Polysaccharide-23 03/08/2015  . Td 07/23/2010  . Tdap 05/28/2020     Objective: Vital Signs: BP 129/82 (BP Location: Left Arm, Patient Position: Sitting, Cuff Size: Normal)   Pulse 67   Ht 5\' 9"  (1.753 m)   Wt 131 lb (59.4 kg)   LMP 05/01/2016   BMI 19.35 kg/m    Physical Exam Constitutional:      Appearance: Normal appearance.   HENT:     Head: Normocephalic and atraumatic.  Eyes:     Comments: Bilateral conjunctival injection was noted.  Cardiovascular:     Rate and Rhythm: Normal rate and regular rhythm.  Pulmonary:     Effort: Pulmonary effort is normal.  Abdominal:     Palpations: Abdomen is soft.  Skin:    General: Skin is warm and dry.  Neurological:     Mental Status: She is alert and oriented to person, place, and time.      Musculoskeletal Exam: Good ROM of her c-spine, thoracic spine and lumbar spine. No tenderness to palpation of her spine. Good ROM of her shoulders, elbows, wrists, MCPs, PIPs and DIPs bilaterally with no signs of synovitis. No nodules seen. Good ROM of her hip, knee, ankle, MTP, PIP and DIP joints bilaterally with no signs of synovitis. No signs of effusion or warmth in her knees. Erythema and tenderness seen in her 4th toe. Tenderness over her  right SI joint.   CDAI Exam: CDAI Score: 0  Patient Global: 0 mm; Provider Global: 0 mm Swollen: 0 ; Tender: 0  Joint Exam 07/19/2020   No joint exam has been documented for this visit   There is currently no information documented on the homunculus. Go to the Rheumatology activity and complete the homunculus joint exam.  Investigation: No additional findings.  Imaging: VAS US CAROTID  Result Date: 06/21/2020 Carotid Arterial Duplex Study Indications:       Bilateral bruits and Patient denies any cerebrovascular                    symptoms. Positive family history of stroke. Risk Factors:      Hypertension, no history of smoking. Comparison Study:  None Performing Technologist: Alecia Mackin RVT, RDCS (AE), RDMS  Examination Guidelines: A complete evaluation includes B-mode imaging, spectral Doppler, color Doppler, and power Doppler as needed of all accessible portions of each vessel. Bilateral testing is considered an integral part of a complete examination. Limited examinations for reoccurring indications may be performed as noted.   Right Carotid Findings: +----------+--------+--------+--------+------------------+--------+           PSV cm/sEDV cm/sStenosisPlaque DescriptionComments +----------+--------+--------+--------+------------------+--------+ CCA Prox  44      9                                          +----------+--------+--------+--------+------------------+--------+ CCA Distal60      19                                         +----------+--------+--------+--------+------------------+--------+ ICA Prox  68      26                                         +----------+--------+--------+--------+------------------+--------+ ICA Mid   85      34                                         +----------+--------+--------+--------+------------------+--------+ ICA Distal88      34                                         +----------+--------+--------+--------+------------------+--------+ ECA       73      15                                         +----------+--------+--------+--------+------------------+--------+ +----------+--------+-------+----------------+-------------------+           PSV cm/sEDV cmsDescribe        Arm Pressure (mmHG) +----------+--------+-------+----------------+-------------------+ [id number]             Multiphasic, [id number]                 +----------+--------+-------+----------------+-------------------+ +---------+--------+--+--------+--+---------+ VertebralPSV cm/s48EDV cm/s16Antegrade +---------+--------+--+--------+--+---------+  Left Carotid Findings: +----------+--------+--------+--------+------------------+--------+           PSV cm/sEDV cm/sStenosisPlaque DescriptionComments +----------+--------+--------+--------+------------------+--------+ CCA  Prox  130     27                                         +----------+--------+--------+--------+------------------+--------+ CCA Distal55      19                                          +----------+--------+--------+--------+------------------+--------+ ICA Prox  63      26                                         +----------+--------+--------+--------+------------------+--------+ ICA Mid   90      39                                         +----------+--------+--------+--------+------------------+--------+ ICA Distal96      34                                         +----------+--------+--------+--------+------------------+--------+ ECA       81      16                                         +----------+--------+--------+--------+------------------+--------+ +----------+--------+--------+----------------+-------------------+           PSV cm/sEDV cm/sDescribe        Arm Pressure (mmHG) +----------+--------+--------+----------------+-------------------+ Subclavian100             Multiphasic, [id number]                 +----------+--------+--------+----------------+-------------------+ +---------+--------+--+--------+--+---------+ VertebralPSV cm/s56EDV cm/s17Antegrade +---------+--------+--+--------+--+---------+   Summary: Right Carotid: There was no evidence of thrombus, dissection, atherosclerotic                plaque or stenosis in the cervical carotid system. Left Carotid: There was no evidence of thrombus, dissection, atherosclerotic               plaque or stenosis in the cervical carotid system. Vertebrals:  Bilateral vertebral arteries demonstrate antegrade flow. Subclavians: Normal flow hemodynamics were seen in bilateral subclavian              arteries. *See table(s) above for measurements and observations.  Electronically signed by Ena Dawley MD on 06/21/2020 at 11:56:41 AM.    Final     Recent Labs: Lab Results  Component Value Date   WBC 3.8 (L) 06/18/2020   HGB 12.5 06/18/2020   PLT 145.0 (L) 06/18/2020   NA 138 06/18/2020   K 4.3 06/18/2020   CL 103 06/18/2020   CO2 29 06/18/2020   GLUCOSE 75 06/18/2020   BUN 15  06/18/2020   CREATININE 0.83 06/18/2020   BILITOT 0.5 06/18/2020   ALKPHOS 71 06/18/2020   AST 30 06/18/2020   ALT 26 06/18/2020   PROT 7.8 06/18/2020   ALBUMIN 4.2 06/18/2020   CALCIUM 10.1 06/18/2020   GFRAA 89 02/15/2020   QFTBGOLDPLUS NEGATIVE 06/23/2018  Speciality Comments: Dexa- 05/27/18 Osteopenia T-Score -1.7 BMD 0.963  Prior therapy includes: Plaquenil (inadequate response) , Enbrel (injection site reaction) and methotrexate (neutropenia so on low dose).  Procedures:  No procedures performed Allergies: Etanercept, Methotrexate derivatives, and Pregabalin   Assessment / Plan:     Visit Diagnoses: Rheumatoid arthritis involving multiple sites with positive rheumatoid factor (HCC)-patient does not have any synovitis on my examination.  She does not want to take any immunosuppressive agents.  She states Dr. Sabra Heck has told her that she has combination of iritis and conjunctivitis.  She has taken prednisone eyedrops on as needed basis.  Iritis - Dr. Teodoro Spray, Recurrent iritis.  I emphasized that recurrent iritis can cause permanent eye damage.  She may need lower aggressive immunotherapy.  She may discuss with Dr. Sabra Heck or may consider seeing an ophthalmologist who specializes in autoimmune eye diseases.  At this point she will think about it.  High risk medication use - The plan was to originally start her on Humira but she has declined. She discontinued methotrexate in January 2020 due to neutropenia and recurrent iritis flare  Primary osteoarthritis of both hands-she has mild DIP and PIP thickening.  Primary osteoarthritis of both feet-she had redness on her right fourth toe.  Patient states she bumped her toe into some object.  I have advised her to buddy tape her third and fourth toe until it heals completely.  There were no signs of infection.  DDD (degenerative disc disease), cervical-she denies much discomfort.  She had good range of motion.  Chronic midline low  back pain without sciatica-is off-and-on discomfort. She Pain in thoracic spine - The x-ray showed mild spondylosis and dextroscoliosis of the thoracic spine.   Other neutropenia (HCC)-she had recent labs with her PCP which were reviewed her WBC count was 2.8 she also had mild thrombocytopenia.  Other medical problems are listed as follows:  History of scoliosis  History of hypertension  History of gastroesophageal reflux (GERD)  History of hypothyroidism  History of migraine  History of bradycardia/ sinoatrial node dysfunction   History of IBS  She had 2 doses of COVID-19 vaccination.  I advised her to get a booster 6 months after the second dose.  Orders: No orders of the defined types were placed in this encounter.  No orders of the defined types were placed in this encounter.    Follow-Up Instructions: Return in about 6 months (around 01/19/2021) for Rheumatoid arthritis.   Bo Merino, MD  Note - This record has been created using Editor, commissioning.  Chart creation errors have been sought, but may not always  have been located. Such creation errors do not reflect on  the standard of medical care.

## 2020-07-19 ENCOUNTER — Ambulatory Visit (INDEPENDENT_AMBULATORY_CARE_PROVIDER_SITE_OTHER): Payer: 59 | Admitting: Rheumatology

## 2020-07-19 ENCOUNTER — Other Ambulatory Visit: Payer: Self-pay

## 2020-07-19 ENCOUNTER — Encounter: Payer: Self-pay | Admitting: Rheumatology

## 2020-07-19 VITALS — BP 129/82 | HR 67 | Ht 69.0 in | Wt 131.0 lb

## 2020-07-19 DIAGNOSIS — D708 Other neutropenia: Secondary | ICD-10-CM

## 2020-07-19 DIAGNOSIS — Z8679 Personal history of other diseases of the circulatory system: Secondary | ICD-10-CM

## 2020-07-19 DIAGNOSIS — Z8669 Personal history of other diseases of the nervous system and sense organs: Secondary | ICD-10-CM

## 2020-07-19 DIAGNOSIS — Z79899 Other long term (current) drug therapy: Secondary | ICD-10-CM

## 2020-07-19 DIAGNOSIS — M546 Pain in thoracic spine: Secondary | ICD-10-CM

## 2020-07-19 DIAGNOSIS — M0579 Rheumatoid arthritis with rheumatoid factor of multiple sites without organ or systems involvement: Secondary | ICD-10-CM | POA: Diagnosis not present

## 2020-07-19 DIAGNOSIS — Z8719 Personal history of other diseases of the digestive system: Secondary | ICD-10-CM

## 2020-07-19 DIAGNOSIS — G8929 Other chronic pain: Secondary | ICD-10-CM

## 2020-07-19 DIAGNOSIS — M5030 Other cervical disc degeneration, unspecified cervical region: Secondary | ICD-10-CM

## 2020-07-19 DIAGNOSIS — M545 Low back pain, unspecified: Secondary | ICD-10-CM

## 2020-07-19 DIAGNOSIS — H209 Unspecified iridocyclitis: Secondary | ICD-10-CM

## 2020-07-19 DIAGNOSIS — Z8739 Personal history of other diseases of the musculoskeletal system and connective tissue: Secondary | ICD-10-CM

## 2020-07-19 DIAGNOSIS — M19041 Primary osteoarthritis, right hand: Secondary | ICD-10-CM | POA: Diagnosis not present

## 2020-07-19 DIAGNOSIS — Z87898 Personal history of other specified conditions: Secondary | ICD-10-CM

## 2020-07-19 DIAGNOSIS — M19071 Primary osteoarthritis, right ankle and foot: Secondary | ICD-10-CM

## 2020-07-19 DIAGNOSIS — M19072 Primary osteoarthritis, left ankle and foot: Secondary | ICD-10-CM

## 2020-07-19 DIAGNOSIS — Z8639 Personal history of other endocrine, nutritional and metabolic disease: Secondary | ICD-10-CM

## 2020-07-19 DIAGNOSIS — M19042 Primary osteoarthritis, left hand: Secondary | ICD-10-CM

## 2020-07-25 ENCOUNTER — Encounter: Payer: 59 | Admitting: Family Medicine

## 2020-08-01 ENCOUNTER — Other Ambulatory Visit: Payer: Self-pay

## 2020-08-01 ENCOUNTER — Ambulatory Visit (INDEPENDENT_AMBULATORY_CARE_PROVIDER_SITE_OTHER): Payer: 59

## 2020-08-01 ENCOUNTER — Other Ambulatory Visit (HOSPITAL_COMMUNITY)
Admission: RE | Admit: 2020-08-01 | Discharge: 2020-08-01 | Disposition: A | Discharge status: Home / Self Care | Payer: 59 | Source: Ambulatory Visit | Attending: Obstetrics & Gynecology | Admitting: Obstetrics & Gynecology

## 2020-08-01 VITALS — BP 125/71 | HR 51 | Wt 129.0 lb

## 2020-08-01 DIAGNOSIS — B9689 Other specified bacterial agents as the cause of diseases classified elsewhere: Secondary | ICD-10-CM

## 2020-08-01 DIAGNOSIS — N898 Other specified noninflammatory disorders of vagina: Secondary | ICD-10-CM

## 2020-08-01 NOTE — Progress Notes (Addendum)
SUBJECTIVE:  55 y.o. female complains of white vaginal discharge for 1 week(s). Denies abnormal vaginal bleeding or significant pelvic pain or fever. No UTI symptoms. Denies history of known exposure to STD.  Patient's last menstrual period was 05/01/2016.  OBJECTIVE:  She appears well, afebrile. Urine dipstick: not done.  ASSESSMENT:  Vaginal Discharge  Vaginal irritation   PLAN:   BVAG, CVAG probe sent to lab. Treatment: To be determined once lab results are received ROV prn if symptoms persist or worsen.  chiquita l wilson, CMA  Attestation of Attending Supervision of CMA/RN: Evaluation and management procedures were performed by the nurse under my supervision and collaboration.  I have reviewed the nursing note and chart, and I agree with the management and plan.  Carolyn L. Harraway-Smith, M.D., Cherlynn June

## 2020-08-02 LAB — CERVICOVAGINAL ANCILLARY ONLY
Bacterial Vaginitis (gardnerella): POSITIVE — AB
Candida Glabrata: NEGATIVE
Candida Vaginitis: POSITIVE — AB
Comment: NEGATIVE
Comment: NEGATIVE
Comment: NEGATIVE

## 2020-08-08 ENCOUNTER — Telehealth: Payer: Self-pay | Admitting: Internal Medicine

## 2020-08-08 ENCOUNTER — Other Ambulatory Visit: Payer: Self-pay | Admitting: Obstetrics & Gynecology

## 2020-08-08 MED ORDER — FLUCONAZOLE 150 MG PO TABS: 150.0000 mg | ORAL_TABLET | Freq: Once | ORAL | 3 refills | Status: AC

## 2020-08-08 MED ORDER — METRONIDAZOLE 500 MG PO TABS: 500.0000 mg | ORAL_TABLET | Freq: Two times a day (BID) | ORAL | 0 refills | Status: DC

## 2020-08-08 NOTE — Telephone Encounter (Signed)
Immunizations emailed. Pt informed.

## 2020-08-08 NOTE — Telephone Encounter (Signed)
Patient states she would like a copy of immunizations emailed to her personal email at [email or web address] for pre-employment purposes. Patient needs asap

## 2020-08-08 NOTE — Addendum Note (Signed)
Addended by: Lavonia Drafts on: 08/08/2020 03:49 PM   Modules accepted: Orders

## 2020-08-28 ENCOUNTER — Ambulatory Visit (INDEPENDENT_AMBULATORY_CARE_PROVIDER_SITE_OTHER): Payer: 59 | Admitting: Otolaryngology

## 2020-09-04 NOTE — Progress Notes (Signed)
Office Visit Note  Patient: Brittany Hodges             Date of Birth: Feb 04, 1966           MRN: [id number]             PCP: Colon Branch, MD Referring: Colon Branch, MD Visit Date: 09/05/2020 Occupation: @GUAROCC @  Subjective:  Left knee joint pain   History of Present Illness: Brittany Hodges is a 55 y.o. female with history of seropositive rheumatoid arthritis, iritis, and osteoarthritis.  She is not currently taking any immunosuppressive agents for the management of rheumatoid arthritis.  She presents today with pain in the left knee joint and right shoulder joint.  According to the patient she started running 3 to 4 weeks ago which is exacerbated her left knee joint pain.  She states that she has noticed warmth and swelling for the past 2 weeks.  She has been experiencing a locking sensation when transitioning from a seated to standing position.  Her right shoulder joint discomfort started about 2 months ago while performing an exercise at the gym.  She has been experiencing occasional nocturnal pain in her right shoulder.  She has not been taking any over-the-counter products for pain relief.  She continues to take turmeric, tart cherry, sea moss, and ginger on a daily basis.  She states that she is currently experiencing redness and itching in both eyes due to seasonal allergies.  She states that she has not had any recent iritis flares.  She uses prednisone eyedrops as needed during flares.   Activities of Daily Living:  Patient reports morning stiffness for 0 minutes.   Patient Reports nocturnal pain.  Difficulty dressing/grooming: Denies Difficulty climbing stairs: Denies Difficulty getting out of chair: Denies Difficulty using hands for taps, buttons, cutlery, and/or writing: Denies  Review of Systems  Constitutional: Negative for fatigue.  HENT: Negative for mouth sores, mouth dryness and nose dryness.   Eyes: Positive for redness, itching and dryness. Negative for  pain.  Respiratory: Negative for shortness of breath and difficulty breathing.   Cardiovascular: Negative for chest pain and palpitations.  Gastrointestinal: Negative for blood in stool, constipation and diarrhea.  Endocrine: Negative for increased urination.  Genitourinary: Negative for difficulty urinating.  Musculoskeletal: Positive for arthralgias, joint pain and joint swelling. Negative for myalgias, morning stiffness, muscle tenderness and myalgias.  Skin: Negative for color change, rash and redness.  Allergic/Immunologic: Negative for susceptible to infections.  Neurological: Negative for dizziness, numbness, headaches, memory loss and weakness.  Hematological: Negative for bruising/bleeding tendency.  Psychiatric/Behavioral: Negative for confusion.    PMFS History:  Patient Active Problem List   Diagnosis Date Noted  . Allergic conjunctivitis 09/14/2019  . Chronic sinusitis 09/14/2019  . Intolerance, food 09/14/2019  . Asthmatic bronchitis 04/06/2016  . Laryngopharyngeal reflux (LPR) 04/06/2016  . PCP NOTES >>>>> 03/09/2015  . Annual physical exam 12/21/2014  . Cardiomegaly, by MRI 03/16/2014  . Leukopenia 03/10/2014  . Lightheadedness 02/10/2011  . SINOATRIAL NODE DYSFUNCTION 08/06/2008  . ATRIAL SEPTAL DEFECT 06/29/2007  . Hypothyroidism 03/21/2007  . Essential hypertension 03/21/2007  . Perennial allergic rhinitis 03/21/2007  . GERD-- dysphagia 03/21/2007  . CONSTIPATION 03/21/2007  . IRRITABLE BOWEL SYNDROME 03/21/2007  . RA (rheumatoid arthritis) ([place]) 03/21/2007  . LOW BACK PAIN 03/21/2007  . FIBROMYALGIA 03/21/2007    Past Medical History:  Diagnosis Date  . Allergic rhinitis, cause unspecified   . Anemia   . Asthma   .  Atrial septal defect    s/p repair;   Echo 08/07/10: EF 55-60%; mild MR; atrial septal aneurysm; no residual ASD  . Chest pain, unspecified    cardiac cath 07/31/10: normal coronaries; vigorous LVF  . Esophageal reflux   . Fibromyalgia    . Headache(784.0)   . Hematuria, unspecified   . Hypertension    no meds   . Hypothyroidism   . Irritable bowel syndrome   . Low back pain   . Rheumatoid arthritis(714.0)    Dr Herold Harms  . Scoliosis   . Sinoatrial node dysfunction (HCC)    eval. for chronotropic competence completed in past    Family History  Problem Relation Age of Onset  . Hypertension Mother   . Diabetes Mother   . Kidney failure Mother   . Heart disease Maternal Grandmother   . Stomach cancer Maternal Grandmother 95  . Rheum arthritis Maternal Grandmother   . Glaucoma Father   . Breast cancer Other        aunt  . Diabetes Other        GM  . Hypertension Sister   . Thyroid disease Sister   . Hypertension Sister   . Thyroid disease Sister   . Asthma Son   . Eczema Son   . Sinusitis Daughter   . Colon cancer Neg Hx   . Allergic rhinitis Neg Hx   . Angioedema Neg Hx   . Immunodeficiency Neg Hx    Past Surgical History:  Procedure Laterality Date  . ASD REPAIR  1985  . TONSILLECTOMY AND ADENOIDECTOMY    . TUBAL LIGATION     Social History   Social History Narrative   Household-- lives by herself   Immunization History  Administered Date(s) Administered  . Influenza Split 03/17/2011, 03/17/2012  . Influenza Whole 04/05/2009, 02/21/2010  . Influenza,inj,Quad PF,6+ Mos 03/21/2013, 03/09/2014, 05/24/2015, 01/27/2016  . PFIZER(Purple Top)SARS-COV-2 Vaccination 12/16/2019, 01/16/2020  . Pneumococcal Conjugate-13 01/27/2016  . Pneumococcal Polysaccharide-23 03/08/2015  . Td 07/23/2010  . Tdap 05/28/2020     Objective: Vital Signs: BP 118/73 (BP Location: Left Arm, Patient Position: Sitting, Cuff Size: Normal)   Pulse (!) 44   Resp 13   Ht 5\' 9"  (1.753 m)   Wt 129 lb 12.8 oz (58.9 kg)   LMP 05/01/2016   BMI 19.17 kg/m    Physical Exam Vitals and nursing note reviewed.  Constitutional:      Appearance: She is well-developed.  HENT:     Head: Normocephalic and atraumatic.  Eyes:      Conjunctiva/sclera: Conjunctivae normal.  Pulmonary:     Effort: Pulmonary effort is normal.  Abdominal:     Palpations: Abdomen is soft.  Musculoskeletal:     Cervical back: Normal range of motion.  Skin:    General: Skin is warm and dry.     Capillary Refill: Capillary refill takes less than 2 seconds.  Neurological:     Mental Status: She is alert and oriented to person, place, and time.  Psychiatric:        Behavior: Behavior normal.      Musculoskeletal Exam: C-spine, thoracic spine, lumbar spine good range of motion with no discomfort.  Shoulder joints, elbow joints, wrist joints, MCPs, PIPs, DIPs have good range of motion with no synovitis.  She has some tenderness on the posterior aspect of the right shoulder.  She has complete fist formation bilaterally.  Hip joints have good range of motion with no discomfort.  No tenderness  over trochanteric bursa bilaterally.  She has warmth and a moderate effusion in the left knee joint on exam.  Right knee has full range of motion with no warmth or effusion.  Ankle joints have good range of motion with no tenderness or inflammation.  No tenderness over MTP joints.  CDAI Exam: CDAI Score: -- Patient Global: --; Provider Global: -- Swollen: --; Tender: -- Joint Exam 09/05/2020   No joint exam has been documented for this visit   There is currently no information documented on the homunculus. Go to the Rheumatology activity and complete the homunculus joint exam.  Investigation: No additional findings.  Imaging: XR KNEE 3 VIEW LEFT  Result Date: 09/05/2020 No medial or lateral compartment narrowing was noted.  No chondrocalcinosis was noted.  Mild patellofemoral narrowing was noted. Impression: These findings are consistent with mild chondromalacia patella.  XR Shoulder Right  Result Date: 09/05/2020 No glenohumeral joint space narrowing was noted.  No chondrocalcinosis was noted.  Mild spurring of the acromioclavicular joint  was noted. Impression: Early osteoarthritic changes of the acromioclavicular joint was noted.   Recent Labs: Lab Results  Component Value Date   WBC 3.8 (L) 06/18/2020   HGB 12.5 06/18/2020   PLT 145.0 (L) 06/18/2020   NA 138 06/18/2020   K 4.3 06/18/2020   CL 103 06/18/2020   CO2 29 06/18/2020   GLUCOSE 75 06/18/2020   BUN 15 06/18/2020   CREATININE 0.83 06/18/2020   BILITOT 0.5 06/18/2020   ALKPHOS 71 06/18/2020   AST 30 06/18/2020   ALT 26 06/18/2020   PROT 7.8 06/18/2020   ALBUMIN 4.2 06/18/2020   CALCIUM 10.1 06/18/2020   GFRAA 89 02/15/2020   QFTBGOLDPLUS NEGATIVE 06/23/2018    Speciality Comments: Dexa- 05/27/18 Osteopenia T-Score -1.7 BMD 0.963  Prior therapy includes: Plaquenil (inadequate response) , Enbrel (injection site reaction) and methotrexate (neutropenia so on low dose).  Procedures:  Large Joint Inj: L knee on 09/05/2020 3:48 PM Indications: pain Details: 27 G 1.5 in needle, medial approach  Arthrogram: No  Medications: 40 mg triamcinolone acetonide 40 MG/ML; 2 mL lidocaine 1 % Aspirate: 7 mL Outcome: tolerated well, no immediate complications Procedure, treatment alternatives, risks and benefits explained, specific risks discussed. Consent was given by the patient. Immediately prior to procedure a time out was called to verify the correct patient, procedure, equipment, support staff and site/side marked as required. Patient was prepped and draped in the usual sterile fashion.   Large Joint Inj: R subacromial bursa on 09/05/2020 3:48 PM Indications: pain Details: 27 G 1.5 in needle, posterior approach  Arthrogram: No  Medications: 40 mg triamcinolone acetonide 40 MG/ML; 1 mL lidocaine 1 % Aspirate: 0 mL Outcome: tolerated well, no immediate complications Procedure, treatment alternatives, risks and benefits explained, specific risks discussed. Consent was given by the patient. Immediately prior to procedure a time out was called to verify the correct  patient, procedure, equipment, support staff and site/side marked as required. Patient was prepped and draped in the usual sterile fashion.     Allergies: Etanercept, Methotrexate derivatives, and Pregabalin   Assessment / Plan:     Visit Diagnoses: Rheumatoid arthritis involving multiple sites with positive rheumatoid factor ([place]): She presents today with a moderate effusion in the left knee joint and pain in the right shoulder joint. She attributes the pain and inflammation she is experiencing to increasing her activity level.  X-rays of the left knee and right shoulder joint were obtained. The left knee joint was aspirated  and injected with cortisone.  Synovial fluid sent for analysis.  The right subacromial space was injected with cortisone.  She is not experiencing any other joint pain or inflammation.  She is not on any immunosuppressive agents at this time.  In the past she tried Enbrel, Humira, Plaquenil, and methotrexate.  She is apprehensive to try any new medications at this time.  She has been taking turmeric, tart cherry, ginger, and sea moss on a daily basis to alleviate some of her inflammation.  Discussed the risks of untreated rheumatoid arthritis and iritis in depth in the past.  She was advised to notify us if her symptoms persist or worsen.  She will follow up in 5 months.   Iritis - Dr. Teodoro Spray, recurrent iritis.  She continues to have recurrent flares.  Conjunctival erythema noted bilaterally.  She uses prednisolone drops as needed.  Discussed the importance of being on systemic treatment but she declined.   High risk medication use - Plaquenil (inadequate response) , Enbrel (injection site reaction) and methotrexate (neutropenia so on low dose). Discontinued Humira. She does not want to take any immunosuppressive agents at this time.   Other neutropenia ([place]): White blood cell count was 3.8 and absolute neutrophil count was within normal limits on 06/18/2020.  Primary  osteoarthritis of both hands: She is not experiencing any discomfort or stiffness in her hands at this time.  She has no joint tenderness or inflammation on exam.  She was able to make a complete fist bilaterally.  She has been taking turmeric, tart cherry, ginger, and sea moss on a daily basis.  Chronic pain of left knee - She presents today with pain and swelling in the left knee joint.  She started running 3 to 4 weeks ago which has exacerbated her left knee joint pain.  She has been experiencing pain when rising from a seated to standing position as well as discomfort walking for prolonged periods of time. She noticed swelling and warmth start about 2 weeks ago.  She has been unable to go to the gym for the past 1 week. On examination today she has discomfort with full flexion of the left knee.  She has a moderate effusion with warmth on examination.  X-rays of the left knee were obtained today which were consistent with mild chondromalacia patella.  The left knee joint was aspirated and 7 mL of fluid was sent for analysis.  The left knee joint was injected with cortisone.  She tolerated procedure well.  Procedure note was completed above.  Aftercare was discussed.  She was advised to notify us if her discomfort persists or worsens.  Plan: XR KNEE 3 VIEW LEFT  Chronic right shoulder pain -She presents today with right shoulder joint discomfort which started 2 months ago.  According to the patient she was performing an exercise in the gym which exacerbated her pain.  She has been experiencing nocturnal pain intermittently.  She has not been taking any over-the-counter products for pain relief.  She has continued to take natural anti-inflammatories including turmeric, tart cherry, and ginger on a daily basis.  X-rays of the right shoulder were obtained today which were consistent with [facility] joint arthritis.  The right subacromial space was injected with cortisone.  She tolerated the procedure well.  Procedure  note was completed above.  Aftercare was discussed.  She was advised to notify us if her right shoulder joint pain persists or worsens.  Plan: XR Shoulder Right  Primary osteoarthritis of  both feet: She is not experiencing any discomfort in her feet at this time.  She has good ROM of both ankle joints with no tenderness or joint swelling. She wears proper fitting shoes.   DDD (degenerative disc disease), cervical: She has good ROM with no discomfort.   Trochanteric bursitis, right hip: Resolved.  She has no tenderness to palpation.   Arthropathy of lumbar facet joint: She has mild scoliosis and facet joint arthropathy in the lumbar region evident on x-rays from 02/16/20. She is not experiencing any discomfort in her lower back at this time.   Osteopenia of multiple sites - DEXA updated on 05/27/18:  AP spine T-Score -1.7 BMD 0.963.  She is due to update DEXA, so a future order was placed today.   History of scoliosis: Mild scoliosis and facet joint arthropathy in lumbar spine.  She is not having any discomfort in her lower back at this time.   History of hypertension: BP was 118/73 today in the office.    Other medical conditions are listed as follows:   History of gastroesophageal reflux (GERD)  History of hypothyroidism  History of migraine  History of bradycardia/ sinoatrial node dysfunction   History of IBS    Orders: Orders Placed This Encounter  Procedures  . Large Joint Inj  . Large Joint Inj  . Anaerobic and Aerobic Culture  . XR KNEE 3 VIEW LEFT  . XR Shoulder Right  . DG BONE DENSITY (DXA)  . Synovial Fluid Analysis, Complete   No orders of the defined types were placed in this encounter.     Follow-Up Instructions: Return in about 5 months (around 02/05/2021) for Rheumatoid arthritis, Iritis.   Ofilia Neas, PA-C  Note - This record has been created using Dragon software.  Chart creation errors have been sought, but may not always  have been located. Such  creation errors do not reflect on  the standard of medical care.

## 2020-09-05 ENCOUNTER — Other Ambulatory Visit: Payer: Self-pay

## 2020-09-05 ENCOUNTER — Ambulatory Visit: Payer: 59 | Admitting: Physician Assistant

## 2020-09-05 ENCOUNTER — Encounter: Payer: Self-pay | Admitting: Physician Assistant

## 2020-09-05 ENCOUNTER — Ambulatory Visit: Payer: Self-pay

## 2020-09-05 VITALS — BP 118/73 | HR 44 | Resp 13 | Ht 69.0 in | Wt 129.8 lb

## 2020-09-05 DIAGNOSIS — G8929 Other chronic pain: Secondary | ICD-10-CM | POA: Diagnosis not present

## 2020-09-05 DIAGNOSIS — M47816 Spondylosis without myelopathy or radiculopathy, lumbar region: Secondary | ICD-10-CM

## 2020-09-05 DIAGNOSIS — Z79899 Other long term (current) drug therapy: Secondary | ICD-10-CM | POA: Diagnosis not present

## 2020-09-05 DIAGNOSIS — M25462 Effusion, left knee: Secondary | ICD-10-CM

## 2020-09-05 DIAGNOSIS — Z87898 Personal history of other specified conditions: Secondary | ICD-10-CM

## 2020-09-05 DIAGNOSIS — M8589 Other specified disorders of bone density and structure, multiple sites: Secondary | ICD-10-CM

## 2020-09-05 DIAGNOSIS — D708 Other neutropenia: Secondary | ICD-10-CM | POA: Diagnosis not present

## 2020-09-05 DIAGNOSIS — M25511 Pain in right shoulder: Secondary | ICD-10-CM | POA: Diagnosis not present

## 2020-09-05 DIAGNOSIS — M25562 Pain in left knee: Secondary | ICD-10-CM

## 2020-09-05 DIAGNOSIS — Z8679 Personal history of other diseases of the circulatory system: Secondary | ICD-10-CM

## 2020-09-05 DIAGNOSIS — M7061 Trochanteric bursitis, right hip: Secondary | ICD-10-CM

## 2020-09-05 DIAGNOSIS — M0579 Rheumatoid arthritis with rheumatoid factor of multiple sites without organ or systems involvement: Secondary | ICD-10-CM | POA: Diagnosis not present

## 2020-09-05 DIAGNOSIS — M545 Low back pain, unspecified: Secondary | ICD-10-CM

## 2020-09-05 DIAGNOSIS — Z8719 Personal history of other diseases of the digestive system: Secondary | ICD-10-CM

## 2020-09-05 DIAGNOSIS — H209 Unspecified iridocyclitis: Secondary | ICD-10-CM

## 2020-09-05 DIAGNOSIS — Z8639 Personal history of other endocrine, nutritional and metabolic disease: Secondary | ICD-10-CM

## 2020-09-05 DIAGNOSIS — Z8739 Personal history of other diseases of the musculoskeletal system and connective tissue: Secondary | ICD-10-CM

## 2020-09-05 DIAGNOSIS — M19072 Primary osteoarthritis, left ankle and foot: Secondary | ICD-10-CM

## 2020-09-05 DIAGNOSIS — M19071 Primary osteoarthritis, right ankle and foot: Secondary | ICD-10-CM

## 2020-09-05 DIAGNOSIS — M19042 Primary osteoarthritis, left hand: Secondary | ICD-10-CM

## 2020-09-05 DIAGNOSIS — M19041 Primary osteoarthritis, right hand: Secondary | ICD-10-CM

## 2020-09-05 DIAGNOSIS — Z8669 Personal history of other diseases of the nervous system and sense organs: Secondary | ICD-10-CM

## 2020-09-05 DIAGNOSIS — M5030 Other cervical disc degeneration, unspecified cervical region: Secondary | ICD-10-CM

## 2020-09-05 MED ORDER — TRIAMCINOLONE ACETONIDE 40 MG/ML IJ SUSP
40.0000 mg | INTRAMUSCULAR | Status: AC | PRN
  Administered 2020-09-05: 40 mg via INTRA_ARTICULAR

## 2020-09-05 MED ORDER — LIDOCAINE HCL 1 % IJ SOLN
2.0000 mL | INTRAMUSCULAR | Status: AC | PRN
  Administered 2020-09-05: 2 mL

## 2020-09-05 MED ORDER — LIDOCAINE HCL 1 % IJ SOLN
1.0000 mL | INTRAMUSCULAR | Status: AC | PRN
  Administered 2020-09-05: 1 mL

## 2020-09-05 MED ORDER — TRIAMCINOLONE ACETONIDE 40 MG/ML IJ SUSP
40.0000 mg | INTRAMUSCULAR | Status: AC | PRN
Start: 2020-09-05 — End: 2020-09-05
  Administered 2020-09-05: 40 mg via INTRA_ARTICULAR

## 2020-09-06 NOTE — Progress Notes (Signed)
Synovial fluid is inflammatory.  Crystals are negative.

## 2020-09-08 NOTE — Progress Notes (Signed)
Synovial fluid culture is negative.

## 2020-09-09 ENCOUNTER — Telehealth: Payer: Self-pay | Admitting: Rheumatology

## 2020-09-09 NOTE — Telephone Encounter (Signed)
Patient returning your call. Patient request a call before noon 4/27 for lab results.

## 2020-09-10 NOTE — Telephone Encounter (Signed)
I called patient 

## 2020-09-11 LAB — SYNOVIAL FLUID ANALYSIS, COMPLETE
Basophils, %: 0 %
Eosinophils-Synovial: 0 % (ref 0–2)
Lymphocytes-Synovial Fld: 64 % (ref 0–74)
Monocyte/Macrophage: 21 % (ref 0–69)
Neutrophil, Synovial: 4 % (ref 0–24)
Synoviocytes, %: 11 % (ref 0–15)
WBC, Synovial: 1143 cells/uL — ABNORMAL HIGH (ref <150)

## 2020-09-11 LAB — ANAEROBIC AND AEROBIC CULTURE
AER RESULT:: NO GROWTH
MICRO NUMBER:: 11802688
MICRO NUMBER:: 11802689
SPECIMEN QUALITY:: ADEQUATE
SPECIMEN QUALITY:: ADEQUATE

## 2020-09-16 ENCOUNTER — Emergency Department (HOSPITAL_BASED_OUTPATIENT_CLINIC_OR_DEPARTMENT_OTHER): Payer: 59

## 2020-09-16 ENCOUNTER — Emergency Department (HOSPITAL_BASED_OUTPATIENT_CLINIC_OR_DEPARTMENT_OTHER)
Admission: EM | Admit: 2020-09-16 | Discharge: 2020-09-17 | Disposition: A | Discharge status: Home / Self Care | Payer: 59 | Attending: Emergency Medicine | Admitting: Emergency Medicine

## 2020-09-16 ENCOUNTER — Encounter (HOSPITAL_BASED_OUTPATIENT_CLINIC_OR_DEPARTMENT_OTHER): Payer: Self-pay | Admitting: *Deleted

## 2020-09-16 ENCOUNTER — Other Ambulatory Visit: Payer: Self-pay

## 2020-09-16 DIAGNOSIS — R6884 Jaw pain: Secondary | ICD-10-CM | POA: Insufficient documentation

## 2020-09-16 DIAGNOSIS — R079 Chest pain, unspecified: Secondary | ICD-10-CM | POA: Diagnosis present

## 2020-09-16 DIAGNOSIS — E039 Hypothyroidism, unspecified: Secondary | ICD-10-CM | POA: Diagnosis not present

## 2020-09-16 DIAGNOSIS — I10 Essential (primary) hypertension: Secondary | ICD-10-CM | POA: Diagnosis not present

## 2020-09-16 DIAGNOSIS — J45909 Unspecified asthma, uncomplicated: Secondary | ICD-10-CM | POA: Insufficient documentation

## 2020-09-16 DIAGNOSIS — Z79899 Other long term (current) drug therapy: Secondary | ICD-10-CM | POA: Diagnosis not present

## 2020-09-16 LAB — CBC WITH DIFFERENTIAL/PLATELET
Abs Immature Granulocytes: 0.01 10*3/uL (ref 0.00–0.07)
Basophils Absolute: 0 10*3/uL (ref 0.0–0.1)
Basophils Relative: 1 %
Eosinophils Absolute: 0 10*3/uL (ref 0.0–0.5)
Eosinophils Relative: 1 %
HCT: 40.6 % (ref 36.0–46.0)
Hemoglobin: 12.6 g/dL (ref 12.0–15.0)
Immature Granulocytes: 0 %
Lymphocytes Relative: 27 %
Lymphs Abs: 1.6 10*3/uL (ref 0.7–4.0)
MCH: 28.1 pg (ref 26.0–34.0)
MCHC: 31 g/dL (ref 30.0–36.0)
MCV: 90.4 fL (ref 80.0–100.0)
Monocytes Absolute: 0.8 10*3/uL (ref 0.1–1.0)
Monocytes Relative: 13 %
Neutro Abs: 3.5 10*3/uL (ref 1.7–7.7)
Neutrophils Relative %: 58 %
Platelets: 183 10*3/uL (ref 150–400)
RBC: 4.49 MIL/uL (ref 3.87–5.11)
RDW: 13.1 % (ref 11.5–15.5)
WBC: 5.9 10*3/uL (ref 4.0–10.5)
nRBC: 0 % (ref 0.0–0.2)

## 2020-09-16 LAB — COMPREHENSIVE METABOLIC PANEL
ALT: 20 U/L (ref 0–44)
AST: 25 U/L (ref 15–41)
Albumin: 4 g/dL (ref 3.5–5.0)
Alkaline Phosphatase: 60 U/L (ref 38–126)
Anion gap: 9 (ref 5–15)
BUN: 14 mg/dL (ref 6–20)
CO2: 24 mmol/L (ref 22–32)
Calcium: 9.7 mg/dL (ref 8.9–10.3)
Chloride: 104 mmol/L (ref 98–111)
Creatinine, Ser: 0.85 mg/dL (ref 0.44–1.00)
GFR, Estimated: >60 mL/min (ref >60)
Glucose, Bld: 89 mg/dL (ref 70–99)
Potassium: 4 mmol/L (ref 3.5–5.1)
Sodium: 137 mmol/L (ref 135–145)
Total Bilirubin: 0.4 mg/dL (ref 0.3–1.2)
Total Protein: 8 g/dL (ref 6.5–8.1)

## 2020-09-16 LAB — TROPONIN I (HIGH SENSITIVITY): Troponin I (High Sensitivity): 5 ng/L (ref <18)

## 2020-09-16 LAB — D-DIMER, QUANTITATIVE: D-Dimer, Quant: 1.28 ug/mL-FEU — ABNORMAL HIGH (ref 0.00–0.50)

## 2020-09-16 LAB — LIPASE, BLOOD: Lipase: 37 U/L (ref 11–51)

## 2020-09-16 MED ORDER — IOHEXOL 350 MG/ML SOLN
100.0000 mL | Freq: Once | INTRAVENOUS | Status: AC | PRN
  Administered 2020-09-16: 100 mL via INTRAVENOUS

## 2020-09-16 MED ORDER — KETOROLAC TROMETHAMINE 30 MG/ML IJ SOLN
30.0000 mg | Freq: Once | INTRAMUSCULAR | Status: AC
  Administered 2020-09-16: 30 mg via INTRAVENOUS
  Filled 2020-09-16: qty 1

## 2020-09-16 NOTE — ED Triage Notes (Addendum)
Sharp pain in her right chest into her scapula when she takes a breath. No hx of PE. Pain in her ears.

## 2020-09-16 NOTE — ED Notes (Signed)
Pt placed in a gown and hooked up to the monitor with the 5 lead, BP cuff and pulse ox 

## 2020-09-16 NOTE — ED Provider Notes (Signed)
[facility] DEPART[facility] Provider Note   CSN: [id number] Arrival date & time: 09/16/20  2025     History Chief Complaint  Patient presents with  . Chest Pain    Brittany Hodges is a 55 y.o. female.  The history is provided by the patient.  Chest Pain Pain location:  R chest Pain quality: aching   Pain radiates to:  R jaw Pain severity:  Mild Onset quality:  Gradual Timing:  Intermittent Progression:  Waxing and waning Chronicity:  New Context: breathing   Relieved by:  Nothing Worsened by:  Nothing Associated symptoms: no abdominal pain, no back pain, no cough, no fever, no palpitations, no shortness of breath and no vomiting   Risk factors: no coronary artery disease, no diabetes mellitus, no high cholesterol, no hypertension and no prior DVT/PE        Past Medical History:  Diagnosis Date  . Allergic rhinitis, cause unspecified   . Anemia   . Asthma   . Atrial septal defect    s/p repair;   Echo 08/07/10: EF 55-60%; mild MR; atrial septal aneurysm; no residual ASD  . Chest pain, unspecified    cardiac cath 07/31/10: normal coronaries; vigorous LVF  . Esophageal reflux   . Fibromyalgia   . Headache(784.0)   . Hematuria, unspecified   . Hypertension    no meds   . Hypothyroidism   . Irritable bowel syndrome   . Low back pain   . Rheumatoid arthritis(714.0)    Dr Herold Harms  . Scoliosis   . Sinoatrial node dysfunction (HCC)    eval. for chronotropic competence completed in past    Patient Active Problem List   Diagnosis Date Noted  . Allergic conjunctivitis 09/14/2019  . Chronic sinusitis 09/14/2019  . Intolerance, food 09/14/2019  . Asthmatic bronchitis 04/06/2016  . Laryngopharyngeal reflux (LPR) 04/06/2016  . PCP NOTES >>>>> 03/09/2015  . Annual physical exam 12/21/2014  . Cardiomegaly, by MRI 03/16/2014  . Leukopenia 03/10/2014  . Lightheadedness 02/10/2011  . SINOATRIAL NODE DYSFUNCTION 08/06/2008  . ATRIAL SEPTAL DEFECT  06/29/2007  . Hypothyroidism 03/21/2007  . Essential hypertension 03/21/2007  . Perennial allergic rhinitis 03/21/2007  . GERD-- dysphagia 03/21/2007  . CONSTIPATION 03/21/2007  . IRRITABLE BOWEL SYNDROME 03/21/2007  . RA (rheumatoid arthritis) ([place]) 03/21/2007  . LOW BACK PAIN 03/21/2007  . FIBROMYALGIA 03/21/2007    Past Surgical History:  Procedure Laterality Date  . ASD REPAIR  1985  . TONSILLECTOMY AND ADENOIDECTOMY    . TUBAL LIGATION       OB History    Gravida  5   Para  3   Term  3   Preterm      AB  2   Living  2     SAB      IAB      Ectopic      Multiple      Live Births  2           Family History  Problem Relation Age of Onset  . Hypertension Mother   . Diabetes Mother   . Kidney failure Mother   . Heart disease Maternal Grandmother   . Stomach cancer Maternal Grandmother 95  . Rheum arthritis Maternal Grandmother   . Glaucoma Father   . Breast cancer Other        aunt  . Diabetes Other        GM  . Hypertension Sister   . Thyroid disease Sister   .  Hypertension Sister   . Thyroid disease Sister   . Asthma Son   . Eczema Son   . Sinusitis Daughter   . Colon cancer Neg Hx   . Allergic rhinitis Neg Hx   . Angioedema Neg Hx   . Immunodeficiency Neg Hx     Social History   Tobacco Use  . Smoking status: Never Smoker  . Smokeless tobacco: Never Used  . Tobacco comment: never used tobacco  Vaping Use  . Vaping Use: Never used  Substance Use Topics  . Alcohol use: No  . Drug use: No    Home Medications Prior to Admission medications   Medication Sig Start Date End Date Taking? Authorizing Provider  amLODipine (NORVASC) 5 MG tablet Take 1 tablet (5 mg total) by mouth daily. 03/22/20  Yes Paz, Alda Berthold, MD  azelastine (ASTELIN) 0.1 % nasal spray 1-2 sprays per nostril twice daily as needed for runny nose. 09/14/19  Yes Bobbitt, Sedalia Muta, MD  BLACK CURRANT SEED OIL PO Take by mouth daily.   Yes [provider]   Carboxymethylcellulose Sodium (EYE DROPS OP) Apply to eye daily.   Yes [provider]  fexofenadine (ALLEGRA) 180 MG tablet Take 180 mg by mouth daily.   Yes [provider]  Ginger, Zingiber officinalis, (GINGER ROOT PO) Take by mouth.   Yes [provider]  Misc Natural Products (TART CHERRY ADVANCED PO) Take by mouth.   Yes [provider]  prednisoLONE acetate (PRED FORTE) 1 % ophthalmic suspension Place 1 drop into both eyes as needed. 06/01/18  Yes [provider]  TURMERIC PO Take by mouth.   Yes [provider]  albuterol (PROAIR HFA) 108 (90 Base) MCG/ACT inhaler Inhale 2 puffs into the lungs every 6 (six) hours as needed. For shortness of breath and wheezing 04/06/16   Noralee Space, MD  amoxicillin (AMOXIL) 500 MG capsule Take 500 mg by mouth 3 (three) times daily. Prior to dental appointments    [provider]  fluticasone (FLONASE) 50 MCG/ACT nasal spray Place 2 sprays into both nostrils daily as needed. 03/23/19   Colon Branch, MD  metroNIDAZOLE (FLAGYL) 500 MG tablet Take 1 tablet (500 mg total) by mouth 2 (two) times daily. Patient not taking: No sig reported 08/08/20   Lavonia Drafts, MD  Spacer/Aero-Holding Chambers (AEROCHAMBER MV) inhaler Use as instructed 09/15/12   Noralee Space, MD    Allergies    Etanercept, Methotrexate derivatives, and Pregabalin  Review of Systems   Review of Systems  Constitutional: Negative for chills and fever.  HENT: Positive for ear pain. Negative for sore throat.   Eyes: Negative for pain and visual disturbance.  Respiratory: Negative for cough and shortness of breath.   Cardiovascular: Positive for chest pain. Negative for palpitations.  Gastrointestinal: Negative for abdominal pain and vomiting.  Genitourinary: Negative for dysuria and hematuria.  Musculoskeletal: Negative for arthralgias and back pain.  Skin: Negative for color change and rash.  Neurological:  Negative for seizures and syncope.  All other systems reviewed and are negative.   Physical Exam Updated Vital Signs BP 137/88   Pulse (!) 56   Temp 98.3 F (36.8 C) (Oral)   Resp 17   Ht 5\' 9"  (1.753 m)   Wt 57.7 kg   LMP 05/01/2016   SpO2 100%   BMI 18.78 kg/m   Physical Exam Vitals and nursing note reviewed.  Constitutional:      General: She is not in  acute distress.    Appearance: She is well-developed. She is not ill-appearing.  HENT:     Head: Normocephalic and atraumatic.     Comments: TMs unremarkable Eyes:     Extraocular Movements: Extraocular movements intact.     Conjunctiva/sclera: Conjunctivae normal.     Pupils: Pupils are equal, round, and reactive to light.  Cardiovascular:     Rate and Rhythm: Normal rate and regular rhythm.     Pulses:          Radial pulses are 2+ on the right side and 2+ on the left side.     Heart sounds: Normal heart sounds. No murmur heard.   Pulmonary:     Effort: Pulmonary effort is normal. No respiratory distress.     Breath sounds: Normal breath sounds. No decreased breath sounds, wheezing or rhonchi.  Chest:     Chest wall: No tenderness.  Abdominal:     Palpations: Abdomen is soft.     Tenderness: There is no abdominal tenderness.  Musculoskeletal:        General: Normal range of motion.     Cervical back: Normal range of motion and neck supple.     Right lower leg: No edema.     Left lower leg: No edema.  Skin:    General: Skin is warm and dry.     Capillary Refill: Capillary refill takes less than 2 seconds.  Neurological:     General: No focal deficit present.     Mental Status: She is alert and oriented to person, place, and time.     Cranial Nerves: No cranial nerve deficit.     Motor: No weakness.  Psychiatric:        Mood and Affect: Mood normal.     ED Results / Procedures / Treatments   Labs (all labs ordered are listed, but only abnormal results are displayed) Labs Reviewed  D-DIMER,  QUANTITATIVE - Abnormal; Notable for the following components:      Result Value   D-Dimer, Quant 1.28 (*)    All other components within normal limits  CBC WITH DIFFERENTIAL/PLATELET  COMPREHENSIVE METABOLIC PANEL  LIPASE, BLOOD  TROPONIN I (HIGH SENSITIVITY)  TROPONIN I (HIGH SENSITIVITY)    EKG EKG Interpretation  Date/Time:  Monday Sep 16 2020 20:36:58 EDT Ventricular Rate:  61 PR Interval:  154 QRS Duration: 76 QT Interval:  390 QTC Calculation: 392 R Axis:   85 Text Interpretation: Normal sinus rhythm Cannot rule out Anterior infarct , age undetermined Abnormal ECG Confirmed by Lennice Sites [phone]) on 09/16/2020 8:41:16 PM   Radiology CT Angio Chest PE W and/or Wo Contrast  Result Date: 09/16/2020 CLINICAL DATA:  Hervey Ard right-sided chest pain with inhalation, arrhythmia EXAM: CT ANGIOGRAPHY CHEST WITH CONTRAST TECHNIQUE: Multidetector CT imaging of the chest was performed using the standard protocol during bolus administration of intravenous contrast. Multiplanar CT image reconstructions and MIPs were obtained to evaluate the vascular anatomy. CONTRAST:  150mL OMNIPAQUE IOHEXOL 350 MG/ML SOLN COMPARISON:  09/16/2020, 03/12/2015 FINDINGS: Cardiovascular: This is a technically adequate evaluation of the pulmonary vasculature. No filling defects or pulmonary emboli. Mild cardiomegaly without pericardial effusion. Stable dilation of the ascending thoracic aorta measuring up to 3.8 cm. No frank aneurysm or dissection. Mediastinum/Nodes: No enlarged mediastinal, hilar, or axillary lymph nodes. Thyroid gland, trachea, and esophagus demonstrate no significant findings. Lungs/Pleura: No acute airspace disease, effusion, or pneumothorax. The central airways are patent. Upper Abdomen: No acute abnormality. Musculoskeletal: No acute or destructive  bony lesions. Reconstructed images demonstrate no additional findings. Review of the MIP images confirms the above findings. IMPRESSION: 1. No evidence of  pulmonary embolus. 2. Stable dilation of the ascending thoracic aorta without frank aneurysm or dissection. 3. Mild cardiomegaly. 4. No acute intrathoracic process. Electronically Signed   By: Randa Ngo M.D.   On: 09/16/2020 23:06   DG Chest Portable 1 View  Result Date: 09/16/2020 CLINICAL DATA:  Right-sided chest pain. EXAM: PORTABLE CHEST 1 VIEW COMPARISON:  June 08, 2019 FINDINGS: Multiple sternal wires are seen. The heart size and mediastinal contours are within normal limits. Both lungs are clear. Mild to moderate severity dextroscoliosis of the mid to lower thoracic spine is seen. IMPRESSION: Stable exam without acute or active cardiopulmonary disease. Electronically Signed   By: Virgina Norfolk M.D.   On: 09/16/2020 22:15    Procedures Procedures   Medications Ordered in ED Medications  ketorolac (TORADOL) 30 MG/ML injection 30 mg (has no administration in time range)  iohexol (OMNIPAQUE) 350 MG/ML injection 100 mL (100 mLs Intravenous Contrast Given 09/16/20 2251)    ED Course  I have reviewed the triage vital signs and the nursing notes.  Pertinent labs & imaging results that were available during my care of the patient were reviewed by me and considered in my medical decision making (see chart for details).    MDM Rules/Calculators/A&P                          YARELLY KUBA is a 55 year old female who presents the ED with chest pain.  History of fibromyalgia, rheumatoid arthritis, IBS.  Patient states that when she takes deep breaths she has a pain in her right upper chest that goes into her jaw and right ear.  There is no signs of ear infection or swelling around the ear or right side of her neck.  Seems to be slightly reproducible.  Heart score is 1.  EKG shows sinus rhythm.  Initial troponin is normal.  D-dimer is elevated and CT scan of the chest was performed that showed no blood clot.  She has stable dilation of known ascending thoracic aorta without frank  aneurysm or dissection.  No pneumonia.  No significant anemia, electrolyte abnormality, kidney injury.  Will check second troponin but overall suspect that this is a musculoskeletal issue.  Will give a dose of Toradol.  Handed off to oncoming ED staff with patient pending repeat troponin.  Anticipate discharge to home with close primary care follow-up.  This chart was dictated using voice recognition software.  Despite best efforts to proofread,  errors can occur which can change the documentation meaning.    Final Clinical Impression(s) / ED Diagnoses Final diagnoses:  Nonspecific chest pain    Rx / DC Orders ED Discharge Orders    None       Lennice Sites, DO 09/16/20 2332

## 2020-09-17 ENCOUNTER — Ambulatory Visit (INDEPENDENT_AMBULATORY_CARE_PROVIDER_SITE_OTHER): Payer: 59 | Admitting: Internal Medicine

## 2020-09-17 VITALS — BP 146/92 | HR 62 | Temp 97.9°F | Ht 69.0 in | Wt 128.0 lb

## 2020-09-17 DIAGNOSIS — R0789 Other chest pain: Secondary | ICD-10-CM

## 2020-09-17 DIAGNOSIS — I10 Essential (primary) hypertension: Secondary | ICD-10-CM

## 2020-09-17 LAB — TROPONIN I (HIGH SENSITIVITY): Troponin I (High Sensitivity): 5 ng/L (ref <18)

## 2020-09-17 MED ORDER — METHOCARBAMOL 500 MG PO TABS: 500.0000 mg | ORAL_TABLET | Freq: Four times a day (QID) | ORAL | 0 refills | Status: DC | PRN

## 2020-09-17 MED ORDER — MELOXICAM 7.5 MG PO TABS: 7.5000 mg | ORAL_TABLET | Freq: Every day | ORAL | 0 refills | Status: DC | PRN

## 2020-09-17 MED ORDER — AMLODIPINE BESYLATE 5 MG PO TABS: 5.0000 mg | ORAL_TABLET | Freq: Every day | ORAL | 1 refills | Status: DC

## 2020-09-17 NOTE — Patient Instructions (Addendum)
Meloxicam 7.5 mg: 1 or 2 tablets a day as needed for pain.  Always take it with food because may cause gastritis and ulcers.  If you notice nausea, stomach pain, change in the color of stools --->  Stop the medicine and let us know   Robaxin, a muscle relaxant, 4 times a day if needed.  Will cause drowsiness.  Heating pad 3-4 times a day  Tylenol  500 mg OTC 2 tabs a day every 8 hours as needed for pain  Call if not gradually better  Next appointment should be by August for checkup

## 2020-09-17 NOTE — Discharge Instructions (Addendum)
Your testing is reassuring.  Follow-up with your primary physician for recheck.

## 2020-09-17 NOTE — Progress Notes (Signed)
Subjective:    Patient ID: Brittany Hodges, female    DOB: Aug 11, 1965, 55 y.o.   MRN: [id number]  DOS:  09/17/2020 Type of visit - description: ER follow-up  Went to the ER yesterday: About 5 days ago developed a pain at the right lateral and anterior chest, along with pain at the neck and even at the right ear. The pain was worse by turning her chest or neck. Work-up at the ER: CMP, CBC, lipase, troponin were okay.  D-dimer slightly elevated, CT chest >> no PE.  Stable dilatation of the ascending aorta with no frank aneurysm  Today pain is about the same. She denies fever chills No substernal chest pain, no edema, no cough.  BP was noted to be slightly elevated but is typically okay at home. Wt Readings from Last 3 Encounters:  09/17/20 128 lb (58.1 kg)  09/16/20 127 lb 3.3 oz (57.7 kg)  09/05/20 129 lb 12.8 oz (58.9 kg)      Review of Systems See above   Past Medical History:  Diagnosis Date  . Allergic rhinitis, cause unspecified   . Anemia   . Asthma   . Atrial septal defect    s/p repair;   Echo 08/07/10: EF 55-60%; mild MR; atrial septal aneurysm; no residual ASD  . Chest pain, unspecified    cardiac cath 07/31/10: normal coronaries; vigorous LVF  . Esophageal reflux   . Fibromyalgia   . Headache(784.0)   . Hematuria, unspecified   . Hypertension    no meds   . Hypothyroidism   . Irritable bowel syndrome   . Low back pain   . Rheumatoid arthritis(714.0)    Dr Herold Harms  . Scoliosis   . Sinoatrial node dysfunction (HCC)    eval. for chronotropic competence completed in past    Past Surgical History:  Procedure Laterality Date  . ASD REPAIR  1985  . TONSILLECTOMY AND ADENOIDECTOMY    . TUBAL LIGATION      Allergies as of 09/17/2020      Reactions   Etanercept    Methotrexate Derivatives    Caused her WBC to drop   Pregabalin       Medication List       Accurate as of Sep 17, 2020  6:01 PM. If you have any questions, ask your nurse or doctor.         STOP taking these medications   metroNIDAZOLE 500 MG tablet Commonly known as: FLAGYL     TAKE these medications   AeroChamber MV inhaler Use as instructed   albuterol 108 (90 Base) MCG/ACT inhaler Commonly known as: ProAir HFA Inhale 2 puffs into the lungs every 6 (six) hours as needed. For shortness of breath and wheezing   amLODipine 5 MG tablet Commonly known as: NORVASC Take 1 tablet (5 mg total) by mouth daily.   amoxicillin 500 MG capsule Commonly known as: AMOXIL Take 500 mg by mouth 3 (three) times daily. Prior to dental appointments   azelastine 0.1 % nasal spray Commonly known as: ASTELIN 1-2 sprays per nostril twice daily as needed for runny nose.   BLACK CURRANT SEED OIL PO Take by mouth daily.   EYE DROPS OP Apply to eye daily.   fexofenadine 180 MG tablet Commonly known as: ALLEGRA Take 180 mg by mouth daily.   fluticasone 50 MCG/ACT nasal spray Commonly known as: FLONASE Place 2 sprays into both nostrils daily as needed.   GINGER ROOT PO Take by mouth.  meloxicam 7.5 MG tablet Commonly known as: MOBIC Take 1-2 tablets (7.5-15 mg total) by mouth daily as needed for pain.   methocarbamol 500 MG tablet Commonly known as: Robaxin Take 1 tablet (500 mg total) by mouth every 6 (six) hours as needed for muscle spasms.   prednisoLONE acetate 1 % ophthalmic suspension Commonly known as: PRED FORTE Place 1 drop into both eyes as needed.   TART CHERRY ADVANCED PO Take by mouth.   TURMERIC PO Take by mouth.          Objective:   Physical Exam BP (!) 146/92 (BP Location: Right Arm, Patient Position: Sitting, Cuff Size: Large)   Pulse 62   Temp 97.9 F (36.6 C) (Temporal)   Ht 5\' 9"  (1.753 m)   Wt 128 lb (58.1 kg)   LMP 05/01/2016   SpO2 100%   BMI 18.90 kg/m  General:   Well developed, NAD, BMI noted. HEENT:  Normocephalic . Face symmetric, atraumatic Neck: Range of motion is normal, she did report some pain when she turns  to the right or left Lungs:  CTA B Normal respiratory effort, no intercostal retractions, no accessory muscle use. Heart: RRR,  no murmur.  Lower extremities: no pretibial edema bilaterally  Skin: Not pale. Not jaundice MSK: Slightly TTP at the thoracic spine Neurologic:  alert & oriented X3.  Speech normal, gait appropriate for age and unassisted. Motor symmetric.  DTR's: Normal except for decrease L knee jerk (not a new finding per patient Psych--  Cognition and judgment appear intact.  Cooperative with normal attention span and concentration.  Behavior appropriate. No anxious or depressed appearing.      Assessment     Assessment   Prediabetes: A1c 6.0 (01-2017) HTN H/o Hypothyroidism (took meds at some point) GERD- dysphagia -Dr. Norville Haggard EGD from 04/02/2014 (Dr Delrae Alfred): Narrowing of proximal esophagus, question of extrinsic extrinsic compression, EUS showing no intrinsic esophageal mass but the spine was seen in close proximity to the esophagus probably resulting in extrinsic compression. The endoscopist recommend repeat CT neck and chest in 3 months.saw ENT 04-2015 for dysphagia,had a scope,  rx PPI Asthma, uses alb rarely  CV: --Atrial septal defect: Status post repair, no residual ASD: Needs ABX prophylaxis preprocedures --Sinoatrial  Dysfunction, sees Dr Caryl Comes   --CP: cath 2012, normal coronaries MSK: --Rheumatoid arthritis,   Dr. Herold Harms + rheumatoid factor +CCP,   h/o  low WBC with Methotrexate,   injection site reaction to Enbrel  , self d/c Humira ~ 12/2018 --Osteopenia: T score -1.7) 05/27/2018) IBS (Miralax prn constipation)   Admission: syncope 04-2016 (unlikely to be arrhythmia related per cards ) History of fibromyalgia  PLAN:  Chest pain, neck pain: As described above, work-up in the ER very reassuring, CT chest with no PE.  Got great relief with Toradol. Suspect pain is MSK, thus rec meloxicam with GI precautions, Robaxin watching for drowsiness, heating  pad, Tylenol, call if no better. HTN: Slightly elevated today but typically normal at home per patient.  RF amlodipine. Work for note provided RTC recommended by 12-2020    This visit occurred during the SARS-CoV-2 public health emergency.  Safety protocols were in place, including screening questions prior to the visit, additional usage of staff PPE, and extensive cleaning of exam room while observing appropriate contact time as indicated for disinfecting solutions.

## 2020-09-17 NOTE — Assessment & Plan Note (Signed)
Chest pain, neck pain: As described above, work-up in the ER very reassuring, CT chest with no PE.  Got great relief with Toradol. Suspect pain is MSK, thus rec meloxicam with GI precautions, Robaxin watching for drowsiness, heating pad, Tylenol, call if no better. HTN: Slightly elevated today but typically normal at home per patient.  RF amlodipine. Work for note provided RTC recommended by 12-2020

## 2020-10-16 ENCOUNTER — Other Ambulatory Visit: Payer: Self-pay

## 2020-10-16 ENCOUNTER — Encounter (INDEPENDENT_AMBULATORY_CARE_PROVIDER_SITE_OTHER): Payer: Self-pay | Admitting: Otolaryngology

## 2020-10-16 ENCOUNTER — Ambulatory Visit (INDEPENDENT_AMBULATORY_CARE_PROVIDER_SITE_OTHER): Payer: 59 | Admitting: Otolaryngology

## 2020-10-16 VITALS — Temp 97.5°F

## 2020-10-16 DIAGNOSIS — K118 Other diseases of salivary glands: Secondary | ICD-10-CM | POA: Diagnosis not present

## 2020-10-16 NOTE — Progress Notes (Signed)
HPI: Brittany Hodges is a 55 y.o. female who returns today for evaluation of right ear discomfort.  I have seen her previously several months ago because of right ear discomfort and history of swelling of the right parotid gland although at that time she had no swelling of the gland.  She states that she has had problems with intermittent swelling of the gland for the past year.  She had an audiologic testing performed at [facility] in February that showed minimal symmetric sensorineural hearing loss with pure-tone's ranging between 15 and 25 DB.  Past Medical History:  Diagnosis Date  . Allergic rhinitis, cause unspecified   . Anemia   . Asthma   . Atrial septal defect    s/p repair;   Echo 08/07/10: EF 55-60%; mild MR; atrial septal aneurysm; no residual ASD  . Chest pain, unspecified    cardiac cath 07/31/10: normal coronaries; vigorous LVF  . Esophageal reflux   . Fibromyalgia   . Headache(784.0)   . Hematuria, unspecified   . Hypertension    no meds   . Hypothyroidism   . Irritable bowel syndrome   . Low back pain   . Rheumatoid arthritis(714.0)    Dr Herold Harms  . Scoliosis   . Sinoatrial node dysfunction (HCC)    eval. for chronotropic competence completed in past   Past Surgical History:  Procedure Laterality Date  . ASD REPAIR  1985  . TONSILLECTOMY AND ADENOIDECTOMY    . TUBAL LIGATION     Social History   Socioeconomic History  . Marital status: Single    Spouse name: Not on file  . Number of children: 3  . Years of education: Not on file  . Highest education level: Not on file  Occupational History  . Occupation: HR assistant   Tobacco Use  . Smoking status: Never Smoker  . Smokeless tobacco: Never Used  . Tobacco comment: never used tobacco  Vaping Use  . Vaping Use: Never used  Substance and Sexual Activity  . Alcohol use: No  . Drug use: No  . Sexual activity: Not Currently    Birth control/protection: None, Surgical  Other Topics Concern  . Not on  file  Social History Narrative   Household-- lives by herself   Social Determinants of Health   Financial Resource Strain: Not on file  Food Insecurity: Not on file  Transportation Needs: Not on file  Physical Activity: Not on file  Stress: Not on file  Social Connections: Not on file   Family History  Problem Relation Age of Onset  . Hypertension Mother   . Diabetes Mother   . Kidney failure Mother   . Heart disease Maternal Grandmother   . Stomach cancer Maternal Grandmother 95  . Rheum arthritis Maternal Grandmother   . Glaucoma Father   . Breast cancer Other        aunt  . Diabetes Other        GM  . Hypertension Sister   . Thyroid disease Sister   . Hypertension Sister   . Thyroid disease Sister   . Asthma Son   . Eczema Son   . Sinusitis Daughter   . Colon cancer Neg Hx   . Allergic rhinitis Neg Hx   . Angioedema Neg Hx   . Immunodeficiency Neg Hx    Allergies  Allergen Reactions  . Etanercept   . Methotrexate Derivatives     Caused her WBC to drop  . Pregabalin  Prior to Admission medications   Medication Sig Start Date End Date Taking? Authorizing Provider  albuterol (PROAIR HFA) 108 (90 Base) MCG/ACT inhaler Inhale 2 puffs into the lungs every 6 (six) hours as needed. For shortness of breath and wheezing 04/06/16   Noralee Space, MD  amLODipine (NORVASC) 5 MG tablet Take 1 tablet (5 mg total) by mouth daily. 09/17/20   Colon Branch, MD  amoxicillin (AMOXIL) 500 MG capsule Take 500 mg by mouth 3 (three) times daily. Prior to dental appointments    [provider]  azelastine (ASTELIN) 0.1 % nasal spray 1-2 sprays per nostril twice daily as needed for runny nose. 09/14/19   Bobbitt, Sedalia Muta, MD  BLACK CURRANT SEED OIL PO Take by mouth daily.    [provider]  Carboxymethylcellulose Sodium (EYE DROPS OP) Apply to eye daily.    [provider]  fexofenadine (ALLEGRA) 180 MG tablet Take 180 mg by mouth daily.    [provider]  fluticasone (FLONASE) 50 MCG/ACT nasal spray Place 2 sprays into both nostrils daily as needed. 03/23/19   Colon Branch, MD  Ginger, Zingiber officinalis, (GINGER ROOT PO) Take by mouth.    [provider]  meloxicam (MOBIC) 7.5 MG tablet Take 1-2 tablets (7.5-15 mg total) by mouth daily as needed for pain. 09/17/20   Colon Branch, MD  methocarbamol (ROBAXIN) 500 MG tablet Take 1 tablet (500 mg total) by mouth every 6 (six) hours as needed for muscle spasms. 09/17/20   Colon Branch, MD  Misc Natural Products (TART CHERRY ADVANCED PO) Take by mouth.    [provider]  prednisoLONE acetate (PRED FORTE) 1 % ophthalmic suspension Place 1 drop into both eyes as needed. 06/01/18   [provider]  Spacer/Aero-Holding Chambers (AEROCHAMBER MV) inhaler Use as instructed 09/15/12   Noralee Space, MD  TURMERIC PO Take by mouth.    [provider]     Positive ROS: Otherwise negative  All other systems have been reviewed and were otherwise negative with the exception of those mentioned in the HPI and as above.  Physical Exam: Constitutional: Alert, well-appearing, no acute distress Ears: External ears without lesions or tenderness. Ear canals are clear bilaterally with intact, clear TMs bilaterally.  Specifically the right ear canal and right TM are clear. Nasal: External nose without lesions. Septum with minimal deformity and mild rhinitis.. Clear nasal passages otherwise.  Both middle meatus regions were clear. Oral: Lips and gums without lesions. Tongue and palate mucosa without lesions. Posterior oropharynx clear.  She is status post tonsillectomy with normal-appearing oropharynx. Neck: No palpable adenopathy or masses.  She has slight enlargement and some fullness of the right parotid gland.  Drainage from the parotid duct was clear.  On palpation of the parotid nodules.  Nontender today.  She has normal facial nerve function. Respiratory: Breathing  comfortably  Skin: No facial/neck lesions or rash noted.  Procedures  Assessment: Right ear discomfort questionable related to parotid pathology versus TMJ.  Plan: We will plan on obtaining a CT scan of the neck with contrast to evaluate the parotid mass and have her follow-up following the CT scan with contrast.   Radene Journey, MD

## 2020-10-21 ENCOUNTER — Other Ambulatory Visit (INDEPENDENT_AMBULATORY_CARE_PROVIDER_SITE_OTHER): Payer: Self-pay

## 2020-10-21 ENCOUNTER — Telehealth (INDEPENDENT_AMBULATORY_CARE_PROVIDER_SITE_OTHER): Payer: Self-pay

## 2020-10-21 DIAGNOSIS — K118 Other diseases of salivary glands: Secondary | ICD-10-CM

## 2020-10-21 NOTE — Telephone Encounter (Signed)
Pt called into office and stated that she is not feel well. She stated that starting on Friday 10/18/20 she has fever, teeth hurt, sore throat lots of drainage,face pain, lots of sinus drainage, nasal pain, and burning in throat. Per Dr. Lucia Gaskins ,pt needs to see PCP.Pt was called and informed.

## 2020-10-28 LAB — HM DEXA SCAN

## 2020-10-29 ENCOUNTER — Encounter: Payer: Self-pay | Admitting: Internal Medicine

## 2020-11-04 ENCOUNTER — Other Ambulatory Visit: Payer: Self-pay

## 2020-11-04 ENCOUNTER — Ambulatory Visit
Admission: RE | Admit: 2020-11-04 | Discharge: 2020-11-04 | Disposition: A | Discharge status: Home / Self Care | Payer: 59 | Source: Ambulatory Visit | Attending: Otolaryngology | Admitting: Otolaryngology

## 2020-11-04 DIAGNOSIS — K118 Other diseases of salivary glands: Secondary | ICD-10-CM

## 2020-11-04 MED ORDER — IOPAMIDOL (ISOVUE-300) INJECTION 61%
75.0000 mL | Freq: Once | INTRAVENOUS | Status: AC | PRN
  Administered 2020-11-04: 75 mL via INTRAVENOUS

## 2020-11-13 ENCOUNTER — Ambulatory Visit (INDEPENDENT_AMBULATORY_CARE_PROVIDER_SITE_OTHER): Payer: 59 | Admitting: Otolaryngology

## 2020-11-13 ENCOUNTER — Encounter: Payer: Self-pay | Admitting: Internal Medicine

## 2020-11-13 ENCOUNTER — Other Ambulatory Visit: Payer: Self-pay

## 2020-11-13 DIAGNOSIS — K116 Mucocele of salivary gland: Secondary | ICD-10-CM

## 2020-11-13 NOTE — Progress Notes (Signed)
HPI: Brittany Hodges is a 55 y.o. female who returns today for evaluation of right parotid mass.  Patient had a CT scan performed a couple weeks ago to better evaluate this.  I reviewed the CT scan with the patient in the office today.  This demonstrated numerous punctate internal foci of calcification and several small benign-appearing cysts within the right parotid gland.  This apparently can be seen in the setting of Sjogren's syndrome but patient denies any history of Sjogren's syndrome although she does have arthritis.  Denies dry mouth. I reviewed with her concerning the small cyst that sometimes will enlarge that there will gradually empty.  She notices this when she gradually pushes on the cyst.  She was also instructed to use sialagogues when they swell up and I agree with this.  Past Medical History:  Diagnosis Date   Allergic rhinitis, cause unspecified    Anemia    Asthma    Atrial septal defect    s/p repair;   Echo 08/07/10: EF 55-60%; mild MR; atrial septal aneurysm; no residual ASD   Chest pain, unspecified    cardiac cath 07/31/10: normal coronaries; vigorous LVF   Esophageal reflux    Fibromyalgia    Headache(784.0)    Hematuria, unspecified    Hypertension    no meds    Hypothyroidism    Irritable bowel syndrome    Low back pain    Rheumatoid arthritis(714.0)    Dr Herold Harms   Scoliosis    Sinoatrial node dysfunction (HCC)    eval. for chronotropic competence completed in past   Past Surgical History:  Procedure Laterality Date   ASD REPAIR  1985   TONSILLECTOMY AND ADENOIDECTOMY     TUBAL LIGATION     Social History   Socioeconomic History   Marital status: Single    Spouse name: Not on file   Number of children: 3   Years of education: Not on file   Highest education level: Not on file  Occupational History   Occupation: HR assistant   Tobacco Use   Smoking status: Never   Smokeless tobacco: Never   Tobacco comments:    never used tobacco   Vaping Use   Vaping Use: Never used  Substance and Sexual Activity   Alcohol use: No   Drug use: No   Sexual activity: Not Currently    Birth control/protection: None, Surgical  Other Topics Concern   Not on file  Social History Narrative   Household-- lives by herself   Social Determinants of Health   Financial Resource Strain: Not on file  Food Insecurity: Not on file  Transportation Needs: Not on file  Physical Activity: Not on file  Stress: Not on file  Social Connections: Not on file   Family History  Problem Relation Age of Onset   Hypertension Mother    Diabetes Mother    Kidney failure Mother    Heart disease Maternal Grandmother    Stomach cancer Maternal Grandmother 95   Rheum arthritis Maternal Grandmother    Glaucoma Father    Breast cancer Other        aunt   Diabetes Other        GM   Hypertension Sister    Thyroid disease Sister    Hypertension Sister    Thyroid disease Sister    Asthma Son    Eczema Son    Sinusitis Daughter    Colon cancer Neg Hx    Allergic rhinitis  Neg Hx    Angioedema Neg Hx    Immunodeficiency Neg Hx    Allergies  Allergen Reactions   Etanercept    Methotrexate Derivatives     Caused her WBC to drop   Pregabalin    Prior to Admission medications   Medication Sig Start Date End Date Taking? Authorizing Provider  albuterol (PROAIR HFA) 108 (90 Base) MCG/ACT inhaler Inhale 2 puffs into the lungs every 6 (six) hours as needed. For shortness of breath and wheezing 04/06/16   Noralee Space, MD  amLODipine (NORVASC) 5 MG tablet Take 1 tablet (5 mg total) by mouth daily. 09/17/20   Colon Branch, MD  amoxicillin (AMOXIL) 500 MG capsule Take 500 mg by mouth 3 (three) times daily. Prior to dental appointments    [provider]  azelastine (ASTELIN) 0.1 % nasal spray 1-2 sprays per nostril twice daily as needed for runny nose. 09/14/19   Bobbitt, [place] Muta, MD  BLACK CURRANT SEED OIL PO Take by mouth daily.    [provider]  Carboxymethylcellulose Sodium (EYE DROPS OP) Apply to eye daily.    [provider]  fexofenadine (ALLEGRA) 180 MG tablet Take 180 mg by mouth daily.    [provider]  fluticasone (FLONASE) 50 MCG/ACT nasal spray Place 2 sprays into both nostrils daily as needed. 03/23/19   Colon Branch, MD  Ginger, Zingiber officinalis, (GINGER ROOT PO) Take by mouth.    [provider]  meloxicam (MOBIC) 7.5 MG tablet Take 1-2 tablets (7.5-15 mg total) by mouth daily as needed for pain. 09/17/20   Colon Branch, MD  methocarbamol (ROBAXIN) 500 MG tablet Take 1 tablet (500 mg total) by mouth every 6 (six) hours as needed for muscle spasms. 09/17/20   Colon Branch, MD  Misc Natural Products (TART CHERRY ADVANCED PO) Take by mouth.    [provider]  prednisoLONE acetate (PRED FORTE) 1 % ophthalmic suspension Place 1 drop into both eyes as needed. 06/01/18   [provider]  Spacer/Aero-Holding Chambers (AEROCHAMBER MV) inhaler Use as instructed 09/15/12   Noralee Space, MD  TURMERIC PO Take by mouth.    [provider]     Positive ROS: Otherwise negative  All other systems have been reviewed and were otherwise negative with the exception of those mentioned in the HPI and as above.  Physical Exam: Constitutional: Alert, well-appearing, no acute distress Ears: External ears without lesions or tenderness. Ear canals are clear bilaterally with intact, clear TMs.  Nasal: External nose without lesions.. Clear nasal passages Oral: Lips and gums without lesions. Tongue and palate mucosa without lesions. Posterior oropharynx clear. Neck: No palpable adenopathy or masses.  The parotid gland is not very enlarged at this point and is soft to palpation with no signs of infection. Respiratory: Breathing comfortably  Skin: No facial/neck lesions or rash noted.  Procedures  Assessment: Chronic bilateral parotid gland changes with benign appearing cyst  within the right parotid gland.  Plan: There is really not great treatment for this outside of local treatment when the cyst and large with use of sialagogues and pressure. I reviewed the scan and the report with the patient in the office today.   Radene Journey, MD

## 2020-11-21 LAB — HM MAMMOGRAPHY

## 2020-11-22 ENCOUNTER — Encounter: Payer: Self-pay | Admitting: Internal Medicine

## 2020-12-12 ENCOUNTER — Telehealth: Payer: Self-pay | Admitting: Rheumatology

## 2020-12-12 DIAGNOSIS — H209 Unspecified iridocyclitis: Secondary | ICD-10-CM

## 2020-12-12 NOTE — Telephone Encounter (Signed)
Per patient, Dr. Estanislado Pandy had mentioned referring her to an eye specialist in the past for RA. Patient would like a referral to an eye doctor. Please call to advise.

## 2020-12-13 NOTE — Telephone Encounter (Signed)
Referral has been placed and patient is aware.

## 2020-12-16 ENCOUNTER — Ambulatory Visit: Payer: 59 | Admitting: Internal Medicine

## 2020-12-20 ENCOUNTER — Ambulatory Visit (INDEPENDENT_AMBULATORY_CARE_PROVIDER_SITE_OTHER): Payer: 59 | Admitting: Internal Medicine

## 2020-12-20 ENCOUNTER — Other Ambulatory Visit: Payer: Self-pay

## 2020-12-20 ENCOUNTER — Encounter: Payer: Self-pay | Admitting: Internal Medicine

## 2020-12-20 VITALS — BP 122/60 | HR 66 | Temp 98.0°F | Resp 16 | Ht 69.0 in | Wt 130.0 lb

## 2020-12-20 DIAGNOSIS — I10 Essential (primary) hypertension: Secondary | ICD-10-CM | POA: Diagnosis not present

## 2020-12-20 DIAGNOSIS — J4520 Mild intermittent asthma, uncomplicated: Secondary | ICD-10-CM | POA: Diagnosis not present

## 2020-12-20 DIAGNOSIS — M0579 Rheumatoid arthritis with rheumatoid factor of multiple sites without organ or systems involvement: Secondary | ICD-10-CM

## 2020-12-20 MED ORDER — AMLODIPINE BESYLATE 2.5 MG PO TABS: 2.5000 mg | ORAL_TABLET | Freq: Every day | ORAL | 1 refills | Status: DC

## 2020-12-20 NOTE — Patient Instructions (Signed)
Decrease amlodipine to 2.5 mg daily   Check the  blood pressure 2  times a week BP GOAL is between 110/65 and  135/85. If it is consistently higher or lower, let me know  Immunizations: Recommend a COVID-vaccine booster Recommend a flu shot this fall   [place], [place] back for a physical exam by February 2023

## 2020-12-20 NOTE — Progress Notes (Signed)
Subjective:    Patient ID: Brittany Hodges, female    DOB: 1965/08/14, 55 y.o.   MRN: [id number]  DOS:  12/20/2020 Type of visit - description: Follow-up  Since the last office visit he is doing well. Has a very healthy lifestyle. Would like to stop BP meds.  Wt Readings from Last 3 Encounters:  12/20/20 130 lb (59 kg)  09/17/20 128 lb (58.1 kg)  09/16/20 127 lb 3.3 oz (57.7 kg)    Review of Systems Denies chest pain or difficulty breathing No nausea or vomiting  Past Medical History:  Diagnosis Date   Allergic rhinitis, cause unspecified    Anemia    Asthma    Atrial septal defect    s/p repair;   Echo 08/07/10: EF 55-60%; mild MR; atrial septal aneurysm; no residual ASD   Chest pain, unspecified    cardiac cath 07/31/10: normal coronaries; vigorous LVF   Esophageal reflux    Fibromyalgia    Headache(784.0)    Hematuria, unspecified    Hypertension    no meds    Hypothyroidism    Irritable bowel syndrome    Low back pain    Rheumatoid arthritis(714.0)    Dr Herold Harms   Scoliosis    Sinoatrial node dysfunction (HCC)    eval. for chronotropic competence completed in past    Past Surgical History:  Procedure Laterality Date   ASD [place]      Allergies as of 12/20/2020       Reactions   Etanercept    Methotrexate Derivatives    Caused her WBC to drop   Pregabalin         Medication List        Accurate as of December 20, 2020  1:11 PM. If you have any questions, ask your nurse or doctor.          AeroChamber MV inhaler Use as instructed   albuterol 108 (90 Base) MCG/ACT inhaler Commonly known as: ProAir HFA Inhale 2 puffs into the lungs every 6 (six) hours as needed. For shortness of breath and wheezing   amLODipine 5 MG tablet Commonly known as: NORVASC Take 1 tablet (5 mg total) by mouth daily.   amoxicillin 500 MG capsule Commonly known as: AMOXIL Take 500 mg by mouth 3  (three) times daily. Prior to dental appointments   azelastine 0.1 % nasal spray Commonly known as: ASTELIN 1-2 sprays per nostril twice daily as needed for runny nose.   BLACK CURRANT SEED OIL PO Take by mouth daily.   EYE DROPS OP Apply to eye daily.   fexofenadine 180 MG tablet Commonly known as: ALLEGRA Take 180 mg by mouth daily.   fluticasone 50 MCG/ACT nasal spray Commonly known as: FLONASE Place 2 sprays into both nostrils daily as needed.   GINGER ROOT PO Take by mouth.   meloxicam 7.5 MG tablet Commonly known as: MOBIC Take 1-2 tablets (7.5-15 mg total) by mouth daily as needed for pain.   methocarbamol 500 MG tablet Commonly known as: Robaxin Take 1 tablet (500 mg total) by mouth every 6 (six) hours as needed for muscle spasms.   prednisoLONE acetate 1 % ophthalmic suspension Commonly known as: PRED FORTE Place 1 drop into both eyes as needed.   TART CHERRY ADVANCED PO Take by mouth.   TURMERIC PO Take by mouth.           Objective:  Physical Exam BP 122/60 (BP Location: Left Arm, Patient Position: Sitting, Cuff Size: Small)   Pulse 66   Temp 98 F (36.7 C) (Oral)   Resp 16   Ht '5\' 9"'$  (1.753 m)   Wt 130 lb (59 kg)   LMP 05/01/2016   SpO2 97%   BMI 19.20 kg/m  General:   Well developed, NAD, BMI noted. HEENT:  Normocephalic . Face symmetric, atraumatic Lungs:  CTA B Normal respiratory effort, no intercostal retractions, no accessory muscle use. Heart: RRR,  no murmur.  Lower extremities: no pretibial edema bilaterally  Skin: Not pale. Not jaundice Neurologic:  alert & oriented X3.  Speech normal, gait appropriate for age and unassisted Psych--  Cognition and judgment appear intact.  Cooperative with normal attention span and concentration.  Behavior appropriate. No anxious or depressed appearing.      Assessment      Assessment   Prediabetes: A1c 6.0 (01-2017) HTN H/o Hypothyroidism (took meds at some point) GERD-  dysphagia -Dr. Norville Haggard EGD from 04/02/2014 (Dr Delrae Alfred): Narrowing of proximal esophagus, question of extrinsic extrinsic compression, EUS showing no intrinsic esophageal mass but the spine was seen in close proximity  to the esophagus probably resulting in extrinsic compression. The endoscopist recommend repeat CT neck and chest in 3 months.saw ENT 04-2015 for dysphagia,had a scope,  rx PPI Asthma, uses alb rarely  CV: --Atrial septal defect: Status post repair, no residual ASD: Needs ABX prophylaxis preprocedures --Sinoatrial  Dysfunction, sees Dr Caryl Comes   --CP: cath 2012, normal coronaries MSK: --Rheumatoid arthritis,   Dr. Herold Harms + rheumatoid factor +CCP,   h/o  low WBC with Methotrexate,   injection site reaction to Enbrel  , self d/c Humira ~ 12/2018 --Osteopenia: T score -1.7) 05/27/2018) IBS (Miralax prn constipation)   Admission: syncope 04-2016 (unlikely to be arrhythmia related per cards ) History of fibromyalgia  PLAN:  HTN: Currently on amlodipine 5 mg, has an excellent lifestyle, ambulatory BPs normal.  Would like to stop medications. Advise patient: Decrease amlodipine to 2.5 mg daily, monitor BPs, if BP increase will go back on higher dose otherwise we can try to stop amlodipine in few months Asthma: Well-controlled, rarely uses albuterol Rheumatoid arthritis with iritis: Needs to see ophthalmology, patient will reach out if she has difficult is getting an appointment Vaccine advise: Rec COVID-vaccine and flu shot, see AVS. RTC 06/2021 CPX   This visit occurred during the SARS-CoV-2 public health emergency.  Safety protocols were in place, including screening questions prior to the visit, additional usage of staff PPE, and extensive cleaning of exam room while observing appropriate contact time as indicated for disinfecting solutions.

## 2020-12-22 DIAGNOSIS — J4520 Mild intermittent asthma, uncomplicated: Secondary | ICD-10-CM | POA: Insufficient documentation

## 2020-12-22 NOTE — Assessment & Plan Note (Signed)
HTN: Currently on amlodipine 5 mg, has an excellent lifestyle, ambulatory BPs normal.  Would like to stop medications. Advise patient: Decrease amlodipine to 2.5 mg daily, monitor BPs, if BP increase will go back on higher dose otherwise we can try to stop amlodipine in few months Asthma: Well-controlled, rarely uses albuterol Rheumatoid arthritis with iritis: Needs to see ophthalmology, patient will reach out if she has difficult is getting an appointment Vaccine advise: Rec COVID-vaccine and flu shot, see AVS. RTC 06/2021 CPX

## 2021-01-03 NOTE — Progress Notes (Deleted)
Office Visit Note  Patient: Brittany Hodges             Date of Birth: 1965/11/29           MRN: [id number]             PCP: Colon Branch, MD Referring: Colon Branch, MD Visit Date: 01/17/2021 Occupation: '@GUAROCC'$ @  Subjective:  No chief complaint on file.   History of Present Illness: Brittany Hodges is a 55 y.o. female ***   Activities of Daily Living:  Patient reports morning stiffness for *** {minute/hour:19697}.   Patient {ACTIONS;DENIES/REPORTS:21021675::"Denies"} nocturnal pain.  Difficulty dressing/grooming: {ACTIONS;DENIES/REPORTS:21021675::"Denies"} Difficulty climbing stairs: {ACTIONS;DENIES/REPORTS:21021675::"Denies"} Difficulty getting out of chair: {ACTIONS;DENIES/REPORTS:21021675::"Denies"} Difficulty using hands for taps, buttons, cutlery, and/or writing: {ACTIONS;DENIES/REPORTS:21021675::"Denies"}  No Rheumatology ROS completed.   PMFS History:  Patient Active Problem List   Diagnosis Date Noted   Mild intermittent asthma 12/22/2020   Chronic sinusitis 09/14/2019   Intolerance, food 09/14/2019   Asthmatic bronchitis 04/06/2016   Laryngopharyngeal reflux (LPR) 04/06/2016   PCP NOTES >>>>> 03/09/2015   Annual physical exam 12/21/2014   Cardiomegaly, by MRI 03/16/2014   Leukopenia 03/10/2014   Lightheadedness 02/10/2011   SINOATRIAL NODE DYSFUNCTION 08/06/2008   ATRIAL SEPTAL DEFECT 06/29/2007   Hypothyroidism 03/21/2007   Essential hypertension 03/21/2007   Perennial allergic rhinitis 03/21/2007   GERD-- dysphagia 03/21/2007   CONSTIPATION 03/21/2007   IRRITABLE BOWEL SYNDROME 03/21/2007   RA (rheumatoid arthritis) ([place]) 03/21/2007   LOW BACK PAIN 03/21/2007   FIBROMYALGIA 03/21/2007    Past Medical History:  Diagnosis Date   Allergic rhinitis, cause unspecified    Anemia    Asthma    Atrial septal defect    s/p repair;   Echo 08/07/10: EF 55-60%; mild MR; atrial septal aneurysm; no residual ASD   Chest pain, unspecified    cardiac cath  07/31/10: normal coronaries; vigorous LVF   Esophageal reflux    Fibromyalgia    Headache(784.0)    Hematuria, unspecified    Hypertension    no meds    Hypothyroidism    Irritable bowel syndrome    Low back pain    Rheumatoid arthritis(714.0)    Dr Herold Harms   Scoliosis    Sinoatrial node dysfunction (HCC)    eval. for chronotropic competence completed in past    Family History  Problem Relation Age of Onset   Hypertension Mother    Diabetes Mother    Kidney failure Mother    Heart disease Maternal Grandmother    Stomach cancer Maternal Grandmother 58   Rheum arthritis Maternal Grandmother    Glaucoma Father    Breast cancer Other        aunt   Diabetes Other        GM   Hypertension Sister    Thyroid disease Sister    Hypertension Sister    Thyroid disease Sister    Asthma Son    Eczema Son    Sinusitis Daughter    Colon cancer Neg Hx    Allergic rhinitis Neg Hx    Angioedema Neg Hx    Immunodeficiency Neg Hx    Past Surgical History:  Procedure Laterality Date   ASD REPAIR  1985   TONSILLECTOMY AND ADENOIDECTOMY     TUBAL LIGATION     Social History   Social History Narrative   Household-- lives by herself   Immunization History  Administered Date(s) Administered   Influenza Split 03/17/2011, 03/17/2012   Influenza Whole  04/05/2009, 02/21/2010   Influenza,inj,Quad PF,6+ Mos 03/21/2013, 03/09/2014, 05/24/2015, 01/27/2016   Influenza-Unspecified 07/30/2020   PFIZER(Purple Top)SARS-COV-2 Vaccination 12/16/2019, 01/16/2020   Pneumococcal Conjugate-13 01/27/2016   Pneumococcal Polysaccharide-23 03/08/2015   Td 07/23/2010   Tdap 05/28/2020     Objective: Vital Signs: LMP 05/01/2016    Physical Exam   Musculoskeletal Exam: ***  CDAI Exam: CDAI Score: -- Patient Global: --; Provider Global: -- Swollen: --; Tender: -- Joint Exam 01/17/2021   No joint exam has been documented for this visit   There is currently no information documented on the  homunculus. Go to the Rheumatology activity and complete the homunculus joint exam.  Investigation: No additional findings.  Imaging: No results found.  Recent Labs: Lab Results  Component Value Date   WBC 5.9 09/16/2020   HGB 12.6 09/16/2020   PLT 183 09/16/2020   NA 137 09/16/2020   K 4.0 09/16/2020   CL 104 09/16/2020   CO2 24 09/16/2020   GLUCOSE 89 09/16/2020   BUN 14 09/16/2020   CREATININE 0.85 09/16/2020   BILITOT 0.4 09/16/2020   ALKPHOS 60 09/16/2020   AST 25 09/16/2020   ALT 20 09/16/2020   PROT 8.0 09/16/2020   ALBUMIN 4.0 09/16/2020   CALCIUM 9.7 09/16/2020   GFRAA 89 02/15/2020   QFTBGOLDPLUS NEGATIVE 06/23/2018    Speciality Comments: Dexa- 05/27/18 Osteopenia T-Score -1.7 BMD 0.963  Prior therapy includes: Plaquenil (inadequate response) , Enbrel (injection site reaction) and methotrexate (neutropenia so on low dose).  Procedures:  No procedures performed Allergies: Etanercept, Methotrexate derivatives, and Pregabalin   Assessment / Plan:     Visit Diagnoses: No diagnosis found.  Orders: No orders of the defined types were placed in this encounter.  No orders of the defined types were placed in this encounter.   Face-to-face time spent with patient was *** minutes. Greater than 50% of time was spent in counseling and coordination of care.  Follow-Up Instructions: No follow-ups on file.   Earnestine Mealing, CMA  Note - This record has been created using Editor, commissioning.  Chart creation errors have been sought, but may not always  have been located. Such creation errors do not reflect on  the standard of medical care.

## 2021-01-17 ENCOUNTER — Ambulatory Visit: Payer: 59 | Admitting: Physician Assistant

## 2021-01-17 DIAGNOSIS — M0579 Rheumatoid arthritis with rheumatoid factor of multiple sites without organ or systems involvement: Secondary | ICD-10-CM

## 2021-01-17 DIAGNOSIS — M19041 Primary osteoarthritis, right hand: Secondary | ICD-10-CM

## 2021-01-17 DIAGNOSIS — G8929 Other chronic pain: Secondary | ICD-10-CM

## 2021-01-17 DIAGNOSIS — M5030 Other cervical disc degeneration, unspecified cervical region: Secondary | ICD-10-CM

## 2021-01-17 DIAGNOSIS — D708 Other neutropenia: Secondary | ICD-10-CM

## 2021-01-17 DIAGNOSIS — Z79899 Other long term (current) drug therapy: Secondary | ICD-10-CM

## 2021-01-17 DIAGNOSIS — M8589 Other specified disorders of bone density and structure, multiple sites: Secondary | ICD-10-CM

## 2021-01-17 DIAGNOSIS — Z8679 Personal history of other diseases of the circulatory system: Secondary | ICD-10-CM

## 2021-01-17 DIAGNOSIS — Z8719 Personal history of other diseases of the digestive system: Secondary | ICD-10-CM

## 2021-01-17 DIAGNOSIS — Z8639 Personal history of other endocrine, nutritional and metabolic disease: Secondary | ICD-10-CM

## 2021-01-17 DIAGNOSIS — Z8739 Personal history of other diseases of the musculoskeletal system and connective tissue: Secondary | ICD-10-CM

## 2021-01-17 DIAGNOSIS — Z8669 Personal history of other diseases of the nervous system and sense organs: Secondary | ICD-10-CM

## 2021-01-17 DIAGNOSIS — H209 Unspecified iridocyclitis: Secondary | ICD-10-CM

## 2021-01-17 DIAGNOSIS — M7061 Trochanteric bursitis, right hip: Secondary | ICD-10-CM

## 2021-01-17 DIAGNOSIS — M19071 Primary osteoarthritis, right ankle and foot: Secondary | ICD-10-CM

## 2021-01-17 DIAGNOSIS — M47816 Spondylosis without myelopathy or radiculopathy, lumbar region: Secondary | ICD-10-CM

## 2021-01-17 DIAGNOSIS — Z87898 Personal history of other specified conditions: Secondary | ICD-10-CM

## 2021-01-17 NOTE — Progress Notes (Signed)
Office Visit Note  Patient: Brittany Hodges             Date of Birth: 1966-02-28           MRN: [id number]             PCP: Colon Branch, MD Referring: Colon Branch, MD Visit Date: 01/31/2021 Occupation: '@GUAROCC'$ @  Subjective:  Iritis flare  History of Present Illness: Brittany Hodges is a 55 y.o. female with history of seropositive rheumatoid arthritis, iritis, osteoarthritis, and DDD.  She is not currently taking any immunosuppressive agents.  Patient ports that she continues to have about 2 flares of iritis per month.  She has no follow-up appointment scheduled with Dr. Sabra Heck later today.  She is currently using prednisone eyedrops for a flare in both eyes. Patient reports that the right shoulder joint and left knee joint cortisone injection on 09/05/2020 provided significant pain relief.  She states that she has been able to return to her normal exercise regimen.  She states that she goes to the gym 3 to 5 days/week and typically runs 3 of those days without difficulty.  She has not noticed any swelling in her knee joints.  She states that she has noticed some increased pain and stiffness in her right hand which is worse after overuse activities.  She has returned to work which has exacerbated the pain and stiffness.  She denies any swelling in her hands currently.  She has been taking tart cherry, ginger, and tumeric daily, which have been helpful.    Activities of Daily Living:  Patient reports morning stiffness for 0 minutes.   Patient Denies nocturnal pain.  Difficulty dressing/grooming: Denies Difficulty climbing stairs: Denies Difficulty getting out of chair: Denies Difficulty using hands for taps, buttons, cutlery, and/or writing: Denies  Review of Systems  Constitutional:  Negative for fatigue.  HENT:  Negative for mouth sores, mouth dryness and nose dryness.   Eyes:  Positive for pain, redness and dryness.  Respiratory:  Negative for shortness of breath and  difficulty breathing.   Cardiovascular:  Negative for chest pain and palpitations.  Gastrointestinal:  Negative for blood in stool, constipation and diarrhea.  Endocrine: Negative for increased urination.  Genitourinary:  Negative for difficulty urinating.  Musculoskeletal:  Negative for joint pain, joint pain, joint swelling, myalgias, morning stiffness, muscle tenderness and myalgias.  Skin:  Negative for color change, rash and redness.  Allergic/Immunologic: Negative for susceptible to infections.  Neurological:  Negative for dizziness, numbness, headaches, memory loss and weakness.  Hematological:  Negative for bruising/bleeding tendency.  Psychiatric/Behavioral:  Negative for confusion.    PMFS History:  Patient Active Problem List   Diagnosis Date Noted   Mild intermittent asthma 12/22/2020   Chronic sinusitis 09/14/2019   Intolerance, food 09/14/2019   Asthmatic bronchitis 04/06/2016   Laryngopharyngeal reflux (LPR) 04/06/2016   PCP NOTES >>>>> 03/09/2015   Annual physical exam 12/21/2014   Cardiomegaly, by MRI 03/16/2014   Leukopenia 03/10/2014   Lightheadedness 02/10/2011   SINOATRIAL NODE DYSFUNCTION 08/06/2008   ATRIAL SEPTAL DEFECT 06/29/2007   Hypothyroidism 03/21/2007   Essential hypertension 03/21/2007   Perennial allergic rhinitis 03/21/2007   GERD-- dysphagia 03/21/2007   CONSTIPATION 03/21/2007   IRRITABLE BOWEL SYNDROME 03/21/2007   RA (rheumatoid arthritis) ([place]) 03/21/2007   LOW BACK PAIN 03/21/2007   FIBROMYALGIA 03/21/2007    Past Medical History:  Diagnosis Date   Allergic rhinitis, cause unspecified    Anemia    Asthma  Atrial septal defect    s/p repair;   Echo 08/07/10: EF 55-60%; mild MR; atrial septal aneurysm; no residual ASD   Chest pain, unspecified    cardiac cath 07/31/10: normal coronaries; vigorous LVF   Esophageal reflux    Fibromyalgia    Headache(784.0)    Hematuria, unspecified    Hypertension    no meds    Hypothyroidism     Irritable bowel syndrome    Low back pain    Rheumatoid arthritis(714.0)    Dr Herold Harms   Scoliosis    Sinoatrial node dysfunction (HCC)    eval. for chronotropic competence completed in past    Family History  Problem Relation Age of Onset   Hypertension Mother    Diabetes Mother    Kidney failure Mother    Heart disease Maternal Grandmother    Stomach cancer Maternal Grandmother 45   Rheum arthritis Maternal Grandmother    Glaucoma Father    Breast cancer Other        aunt   Diabetes Other        GM   Hypertension Sister    Thyroid disease Sister    Hypertension Sister    Thyroid disease Sister    Asthma Son    Eczema Son    Sinusitis Daughter    Colon cancer Neg Hx    Allergic rhinitis Neg Hx    Angioedema Neg Hx    Immunodeficiency Neg Hx    Past Surgical History:  Procedure Laterality Date   ASD REPAIR  1985   TONSILLECTOMY AND ADENOIDECTOMY     TUBAL LIGATION     Social History   Social History Narrative   Household-- lives by herself   Immunization History  Administered Date(s) Administered   Influenza Split 03/17/2011, 03/17/2012   Influenza Whole 04/05/2009, 02/21/2010   Influenza,inj,Quad PF,6+ Mos 03/21/2013, 03/09/2014, 05/24/2015, 01/27/2016   Influenza-Unspecified 07/30/2020   PFIZER(Purple Top)SARS-COV-2 Vaccination 12/16/2019, 01/16/2020   Pneumococcal Conjugate-13 01/27/2016   Pneumococcal Polysaccharide-23 03/08/2015   Td 07/23/2010   Tdap 05/28/2020     Objective: Vital Signs: BP (!) 166/83 (BP Location: Left Arm, Patient Position: Sitting, Cuff Size: Normal)   Pulse (!) 42   Ht '5\' 8"'$  (1.727 m)   Wt 134 lb 9.6 oz (61.1 kg)   LMP 05/01/2016   BMI 20.47 kg/m    Physical Exam Vitals and nursing note reviewed.  Constitutional:      Appearance: She is well-developed.  HENT:     Head: Normocephalic and atraumatic.  Eyes:     Conjunctiva/sclera: Conjunctivae normal.  Pulmonary:     Effort: Pulmonary effort is normal.   Abdominal:     Palpations: Abdomen is soft.  Musculoskeletal:     Cervical back: Normal range of motion.  Skin:    General: Skin is warm and dry.     Capillary Refill: Capillary refill takes less than 2 seconds.  Neurological:     Mental Status: She is alert and oriented to person, place, and time.  Psychiatric:        Behavior: Behavior normal.     Musculoskeletal Exam: C-spine has limited range of motion with lateral rotation especially to the right.  Thoracic and lumbar spine have good range of motion.  Shoulder joints, elbow joints, wrist joints, MCPs, PIPs, DIPs have good range of motion with no synovitis.  Tenderness over the right first and second PIP joints.  Complete fist formation bilaterally.  Hip joints have good range of motion  with no discomfort.  Knee joints have good range of motion with no warmth or effusion.  Left knee has mild swelling. Ankle joints have good range of motion with no tenderness or joint swelling.  No tenderness over MTP joints.  CDAI Exam: CDAI Score: -- Patient Global: --; Provider Global: -- Swollen: 0 ; Tender: 2  Joint Exam 01/31/2021      Right  Left  IP   Tender     PIP 2   Tender      There is currently no information documented on the homunculus. Go to the Rheumatology activity and complete the homunculus joint exam.  Investigation: No additional findings.  Imaging: No results found.  Recent Labs: Lab Results  Component Value Date   WBC 5.9 09/16/2020   HGB 12.6 09/16/2020   PLT 183 09/16/2020   NA 137 09/16/2020   K 4.0 09/16/2020   CL 104 09/16/2020   CO2 24 09/16/2020   GLUCOSE 89 09/16/2020   BUN 14 09/16/2020   CREATININE 0.85 09/16/2020   BILITOT 0.4 09/16/2020   ALKPHOS 60 09/16/2020   AST 25 09/16/2020   ALT 20 09/16/2020   PROT 8.0 09/16/2020   ALBUMIN 4.0 09/16/2020   CALCIUM 9.7 09/16/2020   GFRAA 89 02/15/2020   QFTBGOLDPLUS NEGATIVE 06/23/2018    Speciality Comments: Dexa- 05/27/18 Osteopenia T-Score  -1.7 BMD 0.963  Prior therapy includes: Plaquenil (inadequate response) , Enbrel (injection site reaction) and methotrexate (neutropenia so on low dose).  Procedures:  No procedures performed Allergies: Etanercept, Methotrexate derivatives, and Pregabalin   Assessment / Plan:     Visit Diagnoses: Rheumatoid arthritis involving multiple sites with positive rheumatoid factor ([place]): She has been experiencing increased pain and stiffness in both hands since returning to work and performing overuse activities.  On examination today she has tenderness over the right first PIP and second PIP joint but no synovitis was noted.  At her last office visit on 09/05/2020 she presented with a moderate left knee joint effusion and painful range of motion in the right shoulder.  At that time the left knee joint was aspirated and injected with cortisone and the right subacromial bursa was injected with cortisone at that time.  She noticed significant improvement in her joint pain and inflammation after having a cortisone injections performed.  She is not currently taking any immunosuppressive agents due to being apprehensive of potential side effects.  She continues to have recurrent iritis flares.  According to the patient she has been having 2 flares of iritis per month.  She is currently being treated for a flare with prednisone eyedrops and has a follow up appointment today with Dr. Sabra Heck.  In the past she developed neutropenia while on methotrexate.  She had injection site reaction with Enbrel and Humira.  She had an adequate response to Plaquenil previously.  She is apprehensive to retry methotrexate or Humira.  We discussed the importance of being on systemic treatment for rheumatoid arthritis as well as recurrent iritis.  Different treatment options were discussed today in detail.  She would like to proceed with an in office injection if possible due to difficulty with at-home injections in the past.  Indications,  contraindications, potential side effects of Cimzia were discussed today in detail.  All questions were addressed and consent was obtained.  We will plan for an office administration of Cimzia.  She is aware that she will require close lab monitoring due to history of neutropenia.  CBC, CMP, TB gold  will be updated today prior to starting on Cimzia.  She plans on further discussing the initiation of Cimzia with Dr. Sabra Heck today.  She will follow-up in the office in about 8 weeks after starting on Cimzia to assess her response.  Counseled patient that Cimzia is a TNF blocking agent.  Counseled patient on purpose, proper use, and adverse effects of Cimzia.  Reviewed the most common adverse effects including infections, headache, and injection site reactions. Discussed that there is the possibility of an increased risk of malignancy including non-melanoma skin cancer but it is not well understood if this increased risk is due to the medication or the disease state.  Advised patient to get yearly dermatology exams due to risk of skin cancer.  Counseled patient that Cimzia should be held prior to scheduled surgery.  Counseled patient to avoid live vaccines while on Cimzia.  Recommend annual influenza, PCV 15 or PCV20 or Pneumovax 23, and Shingrix as indicated.   Reviewed the importance of regular labs while on Cimzia therapy. Will monitor CBC and CMP 1 month after starting and then every 3 months routinely thereafter. Will monitor TB gold annually. Standing orders placed. Provided patient with medication education material and answered all questions.  Patient voiced understanding.  Patient consented to Cimzia.  Will upload consent into the media tab.  Reviewed storage instructions for Cimzia.  Will apply for Cimzia through patient's insurance.  Advised initial injection must be administered in office.    Dose will be Cimzia 400 mg at weeks 0,2,4 then Cimzia 400 mg every 28 days  Prescription pending labs and  insurance approval.  Iritis - Dr. Teodoro Spray, recurrent iritis.  She continues to have recurrent flares and is followed closely by Dr. Sabra Heck.  According to the patient she has been flaring about twice per month.  She is currently on prednisone acetate eyedrops for flare in both eyes.  She has an appointment today with Dr. Sabra Heck for further assessment.  She is not currently taking any immunosuppressive agents.  In the past she tried taking methotrexate but discontinued due to neutropenia.  She also tried Enbrel and Humira in the past but developed injection site reactions and had difficulty with the at home administration of these injections.  We discussed the importance of being on systemic treatment to better manage these flares.  Indications, contraindications, potential side effects of Cimzia were discussed today in detail and all questions were addressed.  Consent was obtained.  We will apply for Cimzia in office injections through her insurance.  High risk medication use - Applying for in-office administration of cimzia.   Previous therapy: Plaquenil (inadequate response) , Enbrel (injection site reaction) and methotrexate (neutropenia so on low dose). Discontinued Humira.  CBC, CMP, and TB Gold were updated today prior to starting on Cimzia. She is aware that she will require close lab monitoring due to her history of neutropenia.  WBC count was WNL on 09/16/20.    Hepatitis panel was negative on 06/23/2018.  HIV - 12/21/2014.  Immunoglobulins checked on 06/23/2018.  SPEP ordered on 10/05/2018. - Plan: CBC with Differential/Platelet, COMPLETE METABOLIC PANEL WITH GFR, QuantiFERON-TB Gold Plus Discussed the importance of holding cimzia if she develops signs or symptoms of an infection and to resume once the infection has completely cleared.   Screening for tuberculosis -Order for TB gold released today. Plan: QuantiFERON-TB Gold Plus  Other neutropenia ([place]): WBC count was 5.9 on 09/16/20.  Absolute  neutrophil count WNL. CBC with diff ordered today.  Primary osteoarthritis of both hands: She has PIP and DIP thickening consistent with osteoarthritis of both hands.  She has tenderness over the right 1st and 2nd PIP joints.  No inflammation was noted.  Complete fist formation bilaterally.   Primary osteoarthritis of both feet: She is not experiencing any discomfort in her feet at this time.   DDD (degenerative disc disease), cervical: She has limited ROM with lateral rotation, especially to the right.   Trochanteric bursitis, right hip: Resolved.   Chronic midline low back pain without sciatica: She is not experiencing any increased lower back pain at this time.  X-rays obtained on 02/16/2020 were consistent with mild scoliosis and facet joint arthropathy.  She has been exercising 3 to 5 days/week without difficulty.  Osteopenia of multiple sites: DEXA updated on 10/28/20: AP spine BMD 0.921 with T-score -1.1.  Statistically significant decrease in BMD of AP spine.     Other medical conditions are listed as follows:   History of scoliosis  History of hypertension  History of gastroesophageal reflux (GERD)  History of hypothyroidism  History of migraine  History of bradycardia/ sinoatrial node dysfunction   History of IBS    Orders: Orders Placed This Encounter  Procedures   CBC with Differential/Platelet   COMPLETE METABOLIC PANEL WITH GFR   QuantiFERON-TB Gold Plus   No orders of the defined types were placed in this encounter.     Follow-Up Instructions: Return in about 3 months (around 05/02/2021) for Rheumatoid arthritis.   Ofilia Neas, PA-C  Note - This record has been created using Dragon software.  Chart creation errors have been sought, but may not always  have been located. Such creation errors do not reflect on  the standard of medical care.

## 2021-01-18 ENCOUNTER — Other Ambulatory Visit: Payer: Self-pay | Admitting: Internal Medicine

## 2021-01-31 ENCOUNTER — Other Ambulatory Visit: Payer: Self-pay

## 2021-01-31 ENCOUNTER — Telehealth: Payer: Self-pay

## 2021-01-31 ENCOUNTER — Ambulatory Visit: Payer: 59 | Admitting: Physician Assistant

## 2021-01-31 ENCOUNTER — Encounter: Payer: Self-pay | Admitting: Physician Assistant

## 2021-01-31 VITALS — BP 166/83 | HR 42 | Ht 68.0 in | Wt 134.6 lb

## 2021-01-31 DIAGNOSIS — M19041 Primary osteoarthritis, right hand: Secondary | ICD-10-CM

## 2021-01-31 DIAGNOSIS — H209 Unspecified iridocyclitis: Secondary | ICD-10-CM

## 2021-01-31 DIAGNOSIS — M19042 Primary osteoarthritis, left hand: Secondary | ICD-10-CM

## 2021-01-31 DIAGNOSIS — Z87898 Personal history of other specified conditions: Secondary | ICD-10-CM

## 2021-01-31 DIAGNOSIS — M0579 Rheumatoid arthritis with rheumatoid factor of multiple sites without organ or systems involvement: Secondary | ICD-10-CM | POA: Diagnosis not present

## 2021-01-31 DIAGNOSIS — D708 Other neutropenia: Secondary | ICD-10-CM | POA: Diagnosis not present

## 2021-01-31 DIAGNOSIS — M19071 Primary osteoarthritis, right ankle and foot: Secondary | ICD-10-CM

## 2021-01-31 DIAGNOSIS — M19072 Primary osteoarthritis, left ankle and foot: Secondary | ICD-10-CM

## 2021-01-31 DIAGNOSIS — Z8669 Personal history of other diseases of the nervous system and sense organs: Secondary | ICD-10-CM

## 2021-01-31 DIAGNOSIS — G8929 Other chronic pain: Secondary | ICD-10-CM

## 2021-01-31 DIAGNOSIS — Z8639 Personal history of other endocrine, nutritional and metabolic disease: Secondary | ICD-10-CM

## 2021-01-31 DIAGNOSIS — M545 Other chronic pain: Secondary | ICD-10-CM

## 2021-01-31 DIAGNOSIS — M7061 Trochanteric bursitis, right hip: Secondary | ICD-10-CM

## 2021-01-31 DIAGNOSIS — M8589 Other specified disorders of bone density and structure, multiple sites: Secondary | ICD-10-CM

## 2021-01-31 DIAGNOSIS — Z8719 Personal history of other diseases of the digestive system: Secondary | ICD-10-CM

## 2021-01-31 DIAGNOSIS — Z8679 Personal history of other diseases of the circulatory system: Secondary | ICD-10-CM

## 2021-01-31 DIAGNOSIS — Z79899 Other long term (current) drug therapy: Secondary | ICD-10-CM | POA: Diagnosis not present

## 2021-01-31 DIAGNOSIS — M5030 Other cervical disc degeneration, unspecified cervical region: Secondary | ICD-10-CM

## 2021-01-31 DIAGNOSIS — Z111 Encounter for screening for respiratory tuberculosis: Secondary | ICD-10-CM

## 2021-01-31 DIAGNOSIS — Z8739 Personal history of other diseases of the musculoskeletal system and connective tissue: Secondary | ICD-10-CM

## 2021-01-31 NOTE — Patient Instructions (Signed)
Standing Labs We placed an order today for your standing lab work.   Please have your standing labs drawn in 1 month and every 3 months   If possible, please have your labs drawn 2 weeks prior to your appointment so that the provider can discuss your results at your appointment.  Please note that you may see your imaging and lab results in [place] before we have reviewed them. We may be awaiting multiple results to interpret others before contacting you. Please allow our office up to 72 hours to thoroughly review all of the results before contacting the office for clarification of your results.  We have open lab daily: Monday through Thursday from 1:30-4:30 PM and Friday from 1:30-4:00 PM at the office of Dr. Bo Merino, [facility].   Please be advised, all patients with office appointments requiring lab work will take precedent over walk-in lab work.  If possible, please come for your lab work on Monday and Friday afternoons, as you may experience shorter wait times. The office is located at [address], [place], [place], [place] [address] No appointment is necessary.   Labs are drawn by [facility]. Please bring your co-pay at the time of your lab draw.  You may receive a bill from [facility] for your lab work.  If you wish to have your labs drawn at another location, please call the office 24 hours in advance to send orders.  If you have any questions regarding directions or hours of operation,  please call [phone].   As a reminder, please drink plenty of water prior to coming for your lab work. Thanks!   Certolizumab pegol injection What is this medication? CERTOLIZUMAB (SER toe LIZ oo mab) is used to treat rheumatoid arthritis, psoriatic arthritis, ankylosing spondylitis, non-radiographic axial spondyloarthritis, Crohn's disease, and plaque psoriasis. This medicine may be used for other purposes; ask your health care provider or pharmacist if you have  questions. COMMON BRAND NAME(S): Cimzia What should I tell my care team before I take this medication? They need to know if you have any of these conditions: cancer diabetes Guillian-Barre syndrome heart failure hepatitis B or history of hepatitis B infection immune system problems infection or history of infections low blood counts, like low white cell, platelet, or red cell counts multiple sclerosis recently received or scheduled to receive a vaccine tuberculosis, a positive skin test for tuberculosis or have recently been in close contact with someone who has tuberculosis an unusual or allergic reaction to certolizumab, other medicines, latex, rubber, foods, dyes, or preservatives pregnant or trying to get pregnant breast-feeding How should I use this medication? This medicine is for injection under the skin. It is usually given by a health care professional in a hospital or clinic setting. If you get this medicine at home, you will be taught how to prepare and give this medicine. Use exactly as directed. Take your medicine at regular intervals. Do not take your medicine more often than directed. It is important that you put your used needles and syringes in a special sharps container. Do not put them in a trash can. If you do not have a sharps container, call your pharmacist or healthcare provider to get one. A special MedGuide will be given to you by the pharmacist with each prescription and refill. Be sure to read this information carefully each time. Talk to your pediatrician regarding the use of this medicine in children. Special care may be needed. Overdosage: If you think you have  taken too much of this medicine contact a poison control center or emergency room at once. NOTE: This medicine is only for you. Do not share this medicine with others. What if I miss a dose? It is important not to miss your dose. Call your doctor or health care professional if you are unable to keep an  appointment. If you give your medicine by injection under the skin: If you miss a dose, take it as soon as you can. If it is almost time for your next dose, take only that dose. Do not take double or extra doses. Call your doctor or health care professional if you are not sure how to handle a missed dose. What may interact with this medication? Do not take this medicine with any of the following medications: biologic medicines such as abatacept, adalimumab, anakinra, etanercept, golimumab, infliximab, natalizumab, rituximab, secukinumab, tocilizumab, ustekinumab live vaccines tofacitinib This list may not describe all possible interactions. Give your health care provider a list of all the medicines, herbs, non-prescription drugs, or dietary supplements you use. Also tell them if you smoke, drink alcohol, or use illegal drugs. Some items may interact with your medicine. What should I watch for while using this medication? Visit your doctor or health care professional for regular checks on your progress. Tell your doctor or healthcare professional if your symptoms do not start to get better or if they get worse. Your condition will be monitored carefully while you are receiving this medicine. You will be tested for tuberculosis (TB) before you start this medicine. If your doctor prescribes any medicine for TB, you should start taking the TB medicine before starting this medicine. Make sure to finish the full course of TB medicine. Call your doctor or health care professional for advice if you get a fever, chills, sore throat, or other symptoms of an infection. Do not treat yourself. This medicine may decrease your body's ability to fight infection. Try to avoid being around people who are sick. Talk to your doctor about your risk of cancer. You may be more at risk for certain types of cancers if you take this medicine. What side effects may I notice from receiving this medication? Side effects that you  should report to your doctor or health care professional as soon as possible: allergic reactions like skin rash, itching or hives, swelling of the face, lips, or tongue breathing problems changes in vision chest pain joint or muscle pain mouth sores numbness or tingling in any part of your body signs and symptoms of infection like fever or chills; cough; sore throat; pain or trouble passing urine signs and symptoms of liver injury like dark yellow or brown urine; general ill feeling or flu-like symptoms; light-colored stools; loss of appetite; nausea; right upper belly pain; unusually weak or tired; yellowing of the eyes or skin swelling of the legs or ankles swollen lymph nodes in the neck, underarm, or groin areas unexplained weight loss unusual bleeding or bruising unusually weak or tired Side effects that usually do not require medical attention (report to your doctor or health care professional if they continue or are bothersome): irritation at site where injected This list may not describe all possible side effects. Call your doctor for medical advice about side effects. You may report side effects to FDA at 1-800-FDA-1088. Where should I keep my medication? Keep out of the reach of children. If you are using this medicine at home, keep the syringes in the refrigerator between 2 to 8  degrees C (36 to 46 degrees F). Do not freeze. Protect from light. Keep this medicine in the original container. Throw away any unused medicine after the expiration date on the label. NOTE: This sheet is a summary. It may not cover all possible information. If you have questions about this medicine, talk to your doctor, pharmacist, or health care provider.  2022 Elsevier/Gold Standard (2017-08-17 16:35:32)

## 2021-01-31 NOTE — Telephone Encounter (Signed)
Please apply for in-office Cimzia, per Hazel Sams, PA-C. Thanks!   Patient has difficulty with self injecting. Consent obtained and sent to the scan center.

## 2021-02-03 NOTE — Progress Notes (Signed)
CMP WNL.  WBC count is low.  Absolute neutrophil count is low.  Patient has a long standing history of neutropenia.  Reviewed lab work with Dr. Estanislado Pandy.  The plan is to start the patient on cimzia.  Dr. Estanislado Pandy recommends that the patient be evaluated by hematology prior to initiating therapy. Please notify the patient and place referral today.

## 2021-02-04 LAB — CBC WITH DIFFERENTIAL/PLATELET
Absolute Monocytes: 381 cells/uL (ref 200–950)
Basophils Absolute: 20 cells/uL (ref 0–200)
Basophils Relative: 0.7 %
Eosinophils Absolute: 39 cells/uL (ref 15–500)
Eosinophils Relative: 1.4 %
HCT: 41 % (ref 35.0–45.0)
Hemoglobin: 12.7 g/dL (ref 11.7–15.5)
Lymphs Abs: 1081 cells/uL (ref 850–3900)
MCH: 28.2 pg (ref 27.0–33.0)
MCHC: 31 g/dL — ABNORMAL LOW (ref 32.0–36.0)
MCV: 90.9 fL (ref 80.0–100.0)
MPV: 12.6 fL — ABNORMAL HIGH (ref 7.5–12.5)
Monocytes Relative: 13.6 %
Neutro Abs: 1280 cells/uL — ABNORMAL LOW (ref 1500–7800)
Neutrophils Relative %: 45.7 %
Platelets: 154 10*3/uL (ref 140–400)
RBC: 4.51 10*6/uL (ref 3.80–5.10)
RDW: 11.9 % (ref 11.0–15.0)
Total Lymphocyte: 38.6 %
WBC: 2.8 10*3/uL — ABNORMAL LOW (ref 3.8–10.8)

## 2021-02-04 LAB — QUANTIFERON-TB GOLD PLUS
Mitogen-NIL: 7.42 IU/mL
NIL: 0.04 IU/mL
QuantiFERON-TB Gold Plus: NEGATIVE
TB1-NIL: <0 IU/mL
TB2-NIL: <0 IU/mL

## 2021-02-04 LAB — COMPLETE METABOLIC PANEL WITH GFR
AG Ratio: 1.2 (calc) (ref 1.0–2.5)
ALT: 14 U/L (ref 6–29)
AST: 22 U/L (ref 10–35)
Albumin: 4.4 g/dL (ref 3.6–5.1)
Alkaline phosphatase (APISO): 68 U/L (ref 37–153)
BUN: 14 mg/dL (ref 7–25)
CO2: 30 mmol/L (ref 20–32)
Calcium: 10.2 mg/dL (ref 8.6–10.4)
Chloride: 106 mmol/L (ref 98–110)
Creat: 0.85 mg/dL (ref 0.50–1.03)
Globulin: 3.6 g/dL (calc) (ref 1.9–3.7)
Glucose, Bld: 87 mg/dL (ref 65–99)
Potassium: 4.5 mmol/L (ref 3.5–5.3)
Sodium: 142 mmol/L (ref 135–146)
Total Bilirubin: 0.6 mg/dL (ref 0.2–1.2)
Total Protein: 8 g/dL (ref 6.1–8.1)
eGFR: 80 mL/min/{1.73_m2} (ref >=60)

## 2021-02-04 NOTE — Progress Notes (Signed)
TB gold negative

## 2021-02-04 NOTE — Telephone Encounter (Signed)
Submitted a Prior Authorization request to [facility] for CIMZIA via CoverMyMeds. Will update once we receive a response.   Key: [id number]

## 2021-02-05 ENCOUNTER — Telehealth: Payer: Self-pay | Admitting: *Deleted

## 2021-02-05 ENCOUNTER — Other Ambulatory Visit: Payer: Self-pay | Admitting: *Deleted

## 2021-02-05 DIAGNOSIS — D708 Other neutropenia: Secondary | ICD-10-CM

## 2021-02-05 NOTE — Telephone Encounter (Signed)
Per referral - Dr. Estanislado Pandy  called and gave upcoming appointments - confirmed - mailed welcome packet with calendar

## 2021-02-06 ENCOUNTER — Other Ambulatory Visit (HOSPITAL_COMMUNITY): Payer: Self-pay

## 2021-02-06 NOTE — Telephone Encounter (Signed)
Received notification from [facility] regarding a prior authorization for [facility]. Authorization for loading dose has been APPROVED for 3 kits (6 vials) from 02/06/2021 to 03/07/2021.   Auth#: [id number]  Maintenance dose is APPROVED for 1 kit (2 vials) every 28 days from 03/05/2021 to 08/02/2021  Auth#  [id number]  Patient  may have to  fill through [place] [phone]  DUR override info provided for claim: E4 = HD E5 = DP/DE E6 = 1G

## 2021-02-12 NOTE — Telephone Encounter (Signed)
Added patient to [employer] Portal- Case [id number].  Previous PA approved through pharmacy benefit.  Medical Benefit- [phone], [phone]  Faxed PA fax form to [facility]. Will update once we receive a response.  Phone: [phone]  Fax# [phone]

## 2021-02-13 NOTE — Telephone Encounter (Signed)
Received notification from plan that they are unable to approve Cimzia at this time. They need more information:  1- Proof patient has tried DMARD therapy for 3 or more months and had inadequate response.  2- information which provides the severity of RA  Urgent appeal can be submitted to: [phone]

## 2021-02-14 NOTE — Telephone Encounter (Signed)
Submitted an URGENT appeal to  [facility]  for [facility] for in-office injection  Reference # [id number] Fax: [phone] Phone: [phone]

## 2021-02-17 ENCOUNTER — Other Ambulatory Visit: Payer: Self-pay | Admitting: Family

## 2021-02-17 DIAGNOSIS — D709 Neutropenia, unspecified: Secondary | ICD-10-CM

## 2021-02-17 NOTE — Telephone Encounter (Signed)
Received two voicemails from [facility] in the appeals department at [facility]. She states she faxed over documents to pharmacy office fax.  Hoyle Sauer can be reached at: [phone], ext [phone]

## 2021-02-18 ENCOUNTER — Encounter: Payer: Self-pay | Admitting: Family

## 2021-02-18 ENCOUNTER — Other Ambulatory Visit: Payer: Self-pay

## 2021-02-18 ENCOUNTER — Inpatient Hospital Stay: Payer: 59 | Attending: Hematology & Oncology

## 2021-02-18 ENCOUNTER — Inpatient Hospital Stay (HOSPITAL_BASED_OUTPATIENT_CLINIC_OR_DEPARTMENT_OTHER): Payer: 59 | Admitting: Family

## 2021-02-18 VITALS — BP 139/77 | HR 43 | Temp 98.7°F | Resp 17 | Wt 132.0 lb

## 2021-02-18 DIAGNOSIS — D709 Neutropenia, unspecified: Secondary | ICD-10-CM

## 2021-02-18 DIAGNOSIS — E039 Hypothyroidism, unspecified: Secondary | ICD-10-CM | POA: Diagnosis not present

## 2021-02-18 DIAGNOSIS — D696 Thrombocytopenia, unspecified: Secondary | ICD-10-CM | POA: Insufficient documentation

## 2021-02-18 LAB — CMP (CANCER CENTER ONLY)
ALT: 13 U/L (ref 0–44)
AST: 21 U/L (ref 15–41)
Albumin: 4.3 g/dL (ref 3.5–5.0)
Alkaline Phosphatase: 69 U/L (ref 38–126)
Anion gap: 6 (ref 5–15)
BUN: 15 mg/dL (ref 6–20)
CO2: 32 mmol/L (ref 22–32)
Calcium: 10.4 mg/dL — ABNORMAL HIGH (ref 8.9–10.3)
Chloride: 105 mmol/L (ref 98–111)
Creatinine: 1.19 mg/dL — ABNORMAL HIGH (ref 0.44–1.00)
GFR, Estimated: 53 mL/min — ABNORMAL LOW (ref >60)
Glucose, Bld: 90 mg/dL (ref 70–99)
Potassium: 4.7 mmol/L (ref 3.5–5.1)
Sodium: 143 mmol/L (ref 135–145)
Total Bilirubin: 0.4 mg/dL (ref 0.3–1.2)
Total Protein: 8.1 g/dL (ref 6.5–8.1)

## 2021-02-18 LAB — CBC WITH DIFFERENTIAL (CANCER CENTER ONLY)
Abs Immature Granulocytes: 0 10*3/uL (ref 0.00–0.07)
Basophils Absolute: 0 10*3/uL (ref 0.0–0.1)
Basophils Relative: 1 %
Eosinophils Absolute: 0 10*3/uL (ref 0.0–0.5)
Eosinophils Relative: 1 %
HCT: 39.1 % (ref 36.0–46.0)
Hemoglobin: 12.2 g/dL (ref 12.0–15.0)
Immature Granulocytes: 0 %
Lymphocytes Relative: 39 %
Lymphs Abs: 1.4 10*3/uL (ref 0.7–4.0)
MCH: 28.2 pg (ref 26.0–34.0)
MCHC: 31.2 g/dL (ref 30.0–36.0)
MCV: 90.5 fL (ref 80.0–100.0)
Monocytes Absolute: 0.5 10*3/uL (ref 0.1–1.0)
Monocytes Relative: 14 %
Neutro Abs: 1.6 10*3/uL — ABNORMAL LOW (ref 1.7–7.7)
Neutrophils Relative %: 45 %
Platelet Count: 144 10*3/uL — ABNORMAL LOW (ref 150–400)
RBC: 4.32 MIL/uL (ref 3.87–5.11)
RDW: 12 % (ref 11.5–15.5)
WBC Count: 3.6 10*3/uL — ABNORMAL LOW (ref 4.0–10.5)
nRBC: 0 % (ref 0.0–0.2)

## 2021-02-18 LAB — LACTATE DEHYDROGENASE: LDH: 152 U/L (ref 98–192)

## 2021-02-18 LAB — SAVE SMEAR(SSMR), FOR PROVIDER SLIDE REVIEW

## 2021-02-18 NOTE — Telephone Encounter (Signed)
Received notification from  [facility]  regarding a prior authorization for [employer]. Denial has been overturned. Authorization has been APPROVED from 02/12/21 to 02/12/22.   "Patient does not meet all of the clinical criteria for coverage but there are extenuating circumstances that make the service medically necessary including uncontrolled and active rheumatic disease and noninfectious uveitis that is frequently flaring placing the patient at risk for permanent damage to eye and permanent vision loss"  J0717 Case Number: [id number]  Phone # [phone]  Knox Saliva, PharmD, MPH, BCPS Clinical Pharmacist (Rheumatology and Pulmonology)

## 2021-02-18 NOTE — Telephone Encounter (Signed)
Uploaded approval into [employer] Portal and sent message to case manager to see if patient was enrolled into copay assistance. Awaiting response.

## 2021-02-18 NOTE — Progress Notes (Addendum)
Hematology/Oncology Consultation   Name: Brittany Hodges      MRN: [id number]    Location: Room/bed info not found  Date: 02/18/2021 Time:4:29 PM   REFERRING PHYSICIAN: Bo Merino, MD  REASON FOR CONSULT: Neutropenia    DIAGNOSIS: Neutropenia   HISTORY OF PRESENT ILLNESS: Brittany Hodges is a very pleasant 55 yo African American with intermittent mild neutropenia and thrombocytopenia.  She has remained asymptomatic.  Platelets are 144 and WBC count 3.6. Neutrophils 45% and lymphocytes 39%.  Hgb 12.2., MCV 90.  She has had no issue with bleeding. No bruising or petechiae.  She has not had any issues with frequent or recurrent infections.  No fever, chills, n/v, cough, rash, dizziness, SOB, chest pain, palpitations, abdominal pain or changes in bowel or bladder habits.  No swelling, tenderness, numbness or tingling in her extremities.  No falls or syncope to report.  No known family history of neutropenia or thrombocytopenia.  She has history of atrial septal defect repair in her early 20's and states that since that time she has had bradycardia.  She has rheumatoid arthritis and is followed by Dr. Estanislado Pandy. She states that she prefers taking a more natural approach and is not currently on any medication.  She has history of hypothyroidism but has been off of medication for about 5 years.  She had a colonoscopy and EGD in February 2021. EGD showed extrinsic compression of the cervical esophagus from cervical spine, no dilation done. Colonoscopy showed internal hemorrhoids.  Mammogram was done in July and results were negative.  No history of liver disease or hepatitis.  No history of diabetes.  No personal or known familial history of cancer.  She has 3 children, no history of miscarriage. She is postmenopausal.  No smoking, ETOH or recreational drug use.  She has maintained a good appetite and is staying well hydrated. She does not eat red meat or pork. Her weight is described as  stable.  She works as a Tax inspector.  She is a runner and works out 4-5 days a week.   ROS: All other 10 point review of systems is negative.   PAST MEDICAL HISTORY:   Past Medical History:  Diagnosis Date   Allergic rhinitis, cause unspecified    Anemia    Asthma    Atrial septal defect    s/p repair;   Echo 08/07/10: EF 55-60%; mild MR; atrial septal aneurysm; no residual ASD   Chest pain, unspecified    cardiac cath 07/31/10: normal coronaries; vigorous LVF   Esophageal reflux    Fibromyalgia    Headache(784.0)    Hematuria, unspecified    Hypertension    no meds    Hypothyroidism    Irritable bowel syndrome    Low back pain    Rheumatoid arthritis(714.0)    Dr Herold Harms   Scoliosis    Sinoatrial node dysfunction (HCC)    eval. for chronotropic competence completed in past    ALLERGIES: Allergies  Allergen Reactions   Etanercept    Methotrexate Derivatives     Caused her WBC to drop   Pregabalin       MEDICATIONS:  Current Outpatient Medications on File Prior to Visit  Medication Sig Dispense Refill   albuterol (PROAIR HFA) 108 (90 Base) MCG/ACT inhaler Inhale 2 puffs into the lungs every 6 (six) hours as needed. For shortness of breath and wheezing 1 Inhaler 6   amLODipine (NORVASC) 2.5 MG tablet Take 1 tablet (2.5 mg  total) by mouth daily. 90 tablet 1   amoxicillin (AMOXIL) 500 MG capsule Take 500 mg by mouth 3 (three) times daily. Prior to dental appointments     BLACK CURRANT SEED OIL PO Take by mouth daily.     Carboxymethylcellulose Sodium (EYE DROPS OP) Apply to eye daily.     fluticasone (FLONASE) 50 MCG/ACT nasal spray Place 2 sprays into both nostrils daily as needed. 16 g 6   Ginger, Zingiber officinalis, (GINGER ROOT PO) Take by mouth.     Misc Natural Products (TART CHERRY ADVANCED PO) Take by mouth.     prednisoLONE acetate (PRED FORTE) 1 % ophthalmic suspension Place 1 drop into both eyes daily.     Spacer/Aero-Holding  Chambers (AEROCHAMBER MV) inhaler Use as instructed 1 each 0   TURMERIC PO Take by mouth.     No current facility-administered medications on file prior to visit.     PAST SURGICAL HISTORY Past Surgical History:  Procedure Laterality Date   ASD REPAIR  1985   TONSILLECTOMY AND ADENOIDECTOMY     TUBAL LIGATION      FAMILY HISTORY: Family History  Problem Relation Age of Onset   Hypertension Mother    Diabetes Mother    Kidney failure Mother    Heart disease Maternal Grandmother    Stomach cancer Maternal Grandmother 46   Rheum arthritis Maternal Grandmother    Glaucoma Father    Breast cancer Other        aunt   Diabetes Other        GM   Hypertension Sister    Thyroid disease Sister    Hypertension Sister    Thyroid disease Sister    Asthma Son    Eczema Son    Sinusitis Daughter    Colon cancer Neg Hx    Allergic rhinitis Neg Hx    Angioedema Neg Hx    Immunodeficiency Neg Hx     SOCIAL HISTORY:  reports that she has never smoked. She has never used smokeless tobacco. She reports that she does not drink alcohol and does not use drugs.  PERFORMANCE STATUS: The patient's performance status is 0 - Asymptomatic  PHYSICAL EXAM: Most Recent Vital Signs: Blood pressure 139/77, pulse (!) 43, temperature 98.7 F (37.1 C), temperature source Oral, resp. rate 17, weight 132 lb (59.9 kg), last menstrual period 05/01/2016, SpO2 100 %. BP 139/77 (BP Location: Left Arm, Patient Position: Sitting)   Pulse (!) 43   Temp 98.7 F (37.1 C) (Oral)   Resp 17   Wt 132 lb (59.9 kg)   LMP 05/01/2016   SpO2 100%   BMI 20.07 kg/m   General Appearance:    Alert, cooperative, no distress, appears stated age  Head:    Normocephalic, without obvious abnormality, atraumatic  Eyes:    PERRL, conjunctiva/corneas clear, EOM's intact, fundi    benign, both eyes        Throat:   Lips, mucosa, and tongue normal; teeth and gums normal  Neck:   Supple, symmetrical, trachea midline, no  adenopathy;    thyroid:  no enlargement/tenderness/nodules; no carotid   bruit or JVD  Back:     Symmetric, no curvature, ROM normal, no CVA tenderness  Lungs:     Clear to auscultation bilaterally, respirations unlabored  Chest Wall:    No tenderness or deformity   Heart:    Regular rate and rhythm, S1 and S2 normal, no murmur, rub   or gallop  Abdomen:     Soft, non-tender, bowel sounds active all four quadrants,    no masses, no organomegaly        Extremities:   Extremities normal, atraumatic, no cyanosis or edema  Pulses:   2+ and symmetric all extremities  Skin:   Skin color, texture, turgor normal, no rashes or lesions  Lymph nodes:   Cervical, supraclavicular, and axillary nodes normal  Neurologic:   CNII-XII intact, normal strength, sensation and reflexes    throughout    LABORATORY DATA:  Results for orders placed or performed in visit on 02/18/21 (from the past 48 hour(s))  Lactate dehydrogenase (LDH)     Status: None   Collection Time: 02/18/21  2:43 PM  Result Value Ref Range   LDH 152 98 - 192 U/L    Comment: Performed at [facility] at [facility], [address], [place], [place] [address]  CBC with Differential (Cancer Center Only)     Status: Abnormal   Collection Time: 02/18/21  2:52 PM  Result Value Ref Range   WBC Count 3.6 (L) 4.0 - 10.5 K/uL   RBC 4.32 3.87 - 5.11 MIL/uL   Hemoglobin 12.2 12.0 - 15.0 g/dL   HCT 39.1 36.0 - 46.0 %   MCV 90.5 80.0 - 100.0 fL   MCH 28.2 26.0 - 34.0 pg   MCHC 31.2 30.0 - 36.0 g/dL   RDW 12.0 11.5 - 15.5 %   Platelet Count 144 (L) 150 - 400 K/uL   nRBC 0.0 0.0 - 0.2 %   Neutrophils Relative % 45 %   Neutro Abs 1.6 (L) 1.7 - 7.7 K/uL   Lymphocytes Relative 39 %   Lymphs Abs 1.4 0.7 - 4.0 K/uL   Monocytes Relative 14 %   Monocytes Absolute 0.5 0.1 - 1.0 K/uL   Eosinophils Relative 1 %   Eosinophils Absolute 0.0 0.0 - 0.5 K/uL   Basophils Relative 1 %   Basophils Absolute 0.0 0.0 - 0.1  K/uL   Immature Granulocytes 0 %   Abs Immature Granulocytes 0.00 0.00 - 0.07 K/uL    Comment: Performed at [facility] at [facility], [address], [place], [place] [address]  CMP ([place] only)     Status: Abnormal   Collection Time: 02/18/21  2:52 PM  Result Value Ref Range   Sodium 143 135 - 145 mmol/L   Potassium 4.7 3.5 - 5.1 mmol/L   Chloride 105 98 - 111 mmol/L   CO2 32 22 - 32 mmol/L   Glucose, Bld 90 70 - 99 mg/dL    Comment: Glucose reference range applies only to samples taken after fasting for at least 8 hours.   BUN 15 6 - 20 mg/dL   Creatinine 1.19 (H) 0.44 - 1.00 mg/dL   Calcium 10.4 (H) 8.9 - 10.3 mg/dL   Total Protein 8.1 6.5 - 8.1 g/dL   Albumin 4.3 3.5 - 5.0 g/dL   AST 21 15 - 41 U/L   ALT 13 0 - 44 U/L   Alkaline Phosphatase 69 38 - 126 U/L   Total Bilirubin 0.4 0.3 - 1.2 mg/dL   GFR, Estimated 53 (L) >60 mL/min    Comment: (NOTE) Calculated using the CKD-EPI Creatinine Equation (2021)    Anion gap 6 5 - 15    Comment: Performed at [facility] at [facility], [address], [place], [place] [address]  Save Smear [facility])     Status: None   Collection Time: 02/18/21  2:52 PM  Result Value Ref Range   Smear Review SMEAR STAINED AND AVAILABLE FOR REVIEW     Comment: Performed at [facility] Lab at [facility], [address] [place], [place] [address]      RADIOGRAPHY: No results found.     PATHOLOGY: None  ASSESSMENT/PLAN: Ms. Yeung is a very pleasant 55 yo African American with intermittent mild neutropenia and thrombocytopenia.  She is doing well and remains asymptomatic.  Blood smear reviewed with Dr. Marin Olp. All cells appear to be well developed. No abnormality or evidence of malignancy noted. 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  At this time we will release her back to her PCP.    All questions were answered. The patient knows to  call the clinic with any problems, questions or concerns. We can certainly see the patient again if needed.   The patient was discussed with Dr. Marin Olp and he is in agreement with the aforementioned.   Lottie Dawson, NP

## 2021-02-20 ENCOUNTER — Telehealth: Payer: Self-pay | Admitting: *Deleted

## 2021-02-20 NOTE — Telephone Encounter (Signed)
No 02/18/21 los

## 2021-02-24 ENCOUNTER — Other Ambulatory Visit: Payer: Self-pay

## 2021-02-24 ENCOUNTER — Encounter (HOSPITAL_BASED_OUTPATIENT_CLINIC_OR_DEPARTMENT_OTHER): Payer: Self-pay

## 2021-02-24 ENCOUNTER — Telehealth: Payer: Self-pay | Admitting: *Deleted

## 2021-02-24 ENCOUNTER — Emergency Department (HOSPITAL_BASED_OUTPATIENT_CLINIC_OR_DEPARTMENT_OTHER)
Admission: EM | Admit: 2021-02-24 | Discharge: 2021-02-24 | Disposition: A | Discharge status: Home / Self Care | Payer: 59 | Attending: Emergency Medicine | Admitting: Emergency Medicine

## 2021-02-24 DIAGNOSIS — I10 Essential (primary) hypertension: Secondary | ICD-10-CM | POA: Insufficient documentation

## 2021-02-24 DIAGNOSIS — Z79899 Other long term (current) drug therapy: Secondary | ICD-10-CM | POA: Insufficient documentation

## 2021-02-24 DIAGNOSIS — M62838 Other muscle spasm: Secondary | ICD-10-CM | POA: Insufficient documentation

## 2021-02-24 DIAGNOSIS — M542 Cervicalgia: Secondary | ICD-10-CM | POA: Diagnosis present

## 2021-02-24 DIAGNOSIS — J4520 Mild intermittent asthma, uncomplicated: Secondary | ICD-10-CM | POA: Diagnosis not present

## 2021-02-24 DIAGNOSIS — E039 Hypothyroidism, unspecified: Secondary | ICD-10-CM | POA: Insufficient documentation

## 2021-02-24 MED ORDER — LORAZEPAM 2 MG/ML IJ SOLN
1.0000 mg | Freq: Once | INTRAMUSCULAR | Status: AC
  Administered 2021-02-24: 1 mg via INTRAMUSCULAR
  Filled 2021-02-24: qty 1

## 2021-02-24 MED ORDER — METHOCARBAMOL 500 MG PO TABS
500.0000 mg | ORAL_TABLET | Freq: Once | ORAL | Status: AC
  Administered 2021-02-24: 500 mg via ORAL
  Filled 2021-02-24: qty 1

## 2021-02-24 MED ORDER — CYCLOBENZAPRINE HCL 10 MG PO TABS: 10.0000 mg | ORAL_TABLET | Freq: Every evening | ORAL | 0 refills | Status: DC | PRN

## 2021-02-24 NOTE — ED Provider Notes (Signed)
[name] Provider Note   CSN: [id number] Arrival date & time: 02/24/21  1608     History Chief Complaint  Patient presents with   Neck Pain    Brittany Hodges is a 55 y.o. female.  HPI Patient is a 55 year old female with a medical history as noted below.  She presents to the emergency department due to atraumatic neck pain that began last night.  Patient states she has a history of neck spasms.  Symptoms started last night and began worsening overnight.  She reports worsening pain with any movement of the neck or with palpation.  No numbness, weakness, headache, visual changes, chest pain, shortness of breath.    Past Medical History:  Diagnosis Date   Allergic rhinitis, cause unspecified    Anemia    Asthma    Atrial septal defect    s/p repair;   Echo 08/07/10: EF 55-60%; mild MR; atrial septal aneurysm; no residual ASD   Chest pain, unspecified    cardiac cath 07/31/10: normal coronaries; vigorous LVF   Esophageal reflux    Fibromyalgia    Headache(784.0)    Hematuria, unspecified    Hypertension    no meds    Hypothyroidism    Irritable bowel syndrome    Low back pain    Rheumatoid arthritis(714.0)    Dr Herold Harms   Scoliosis    Sinoatrial node dysfunction (HCC)    eval. for chronotropic competence completed in past    Patient Active Problem List   Diagnosis Date Noted   Mild intermittent asthma 12/22/2020   Chronic sinusitis 09/14/2019   Intolerance, food 09/14/2019   Asthmatic bronchitis 04/06/2016   Laryngopharyngeal reflux (LPR) 04/06/2016   PCP NOTES >>>>> 03/09/2015   Annual physical exam 12/21/2014   Cardiomegaly, by MRI 03/16/2014   Leukopenia 03/10/2014   Lightheadedness 02/10/2011   SINOATRIAL NODE DYSFUNCTION 08/06/2008   ATRIAL SEPTAL DEFECT 06/29/2007   Hypothyroidism 03/21/2007   Essential hypertension 03/21/2007   Perennial allergic rhinitis 03/21/2007   GERD-- dysphagia 03/21/2007   CONSTIPATION  03/21/2007   IRRITABLE BOWEL SYNDROME 03/21/2007   RA (rheumatoid arthritis) ([place]) 03/21/2007   LOW BACK PAIN 03/21/2007   FIBROMYALGIA 03/21/2007    Past Surgical History:  Procedure Laterality Date   ASD [place]       OB History     Gravida  5   Para  3   Term  3   Preterm      AB  2   Living  2      SAB      IAB      Ectopic      Multiple      Live Births  2           Family History  Problem Relation Age of Onset   Hypertension Mother    Diabetes Mother    Kidney failure Mother    Heart disease Maternal Grandmother    Stomach cancer Maternal Grandmother 68   Rheum arthritis Maternal Grandmother    Glaucoma Father    Breast cancer Other        aunt   Diabetes Other        GM   Hypertension Sister    Thyroid disease Sister    Hypertension Sister    Thyroid disease Sister    Asthma Son    Eczema Son    Sinusitis  Daughter    Colon cancer Neg Hx    Allergic rhinitis Neg Hx    Angioedema Neg Hx    Immunodeficiency Neg Hx     Social History   Tobacco Use   Smoking status: Never   Smokeless tobacco: Never   Tobacco comments:    never used tobacco  Vaping Use   Vaping Use: Never used  Substance Use Topics   Alcohol use: No   Drug use: No    Home Medications Prior to Admission medications   Medication Sig Start Date End Date Taking? Authorizing Provider  cyclobenzaprine (FLEXERIL) 10 MG tablet Take 1 tablet (10 mg total) by mouth at bedtime as needed for muscle spasms. 02/24/21  Yes Rayna Sexton, PA-C  albuterol (PROAIR HFA) 108 (90 Base) MCG/ACT inhaler Inhale 2 puffs into the lungs every 6 (six) hours as needed. For shortness of breath and wheezing 04/06/16   Noralee Space, MD  amLODipine (NORVASC) 2.5 MG tablet Take 1 tablet (2.5 mg total) by mouth daily. 12/20/20   Colon Branch, MD  amoxicillin (AMOXIL) 500 MG capsule Take 500 mg by mouth 3 (three) times daily. Prior to  dental appointments    [provider]  BLACK CURRANT SEED OIL PO Take by mouth daily.    [provider]  Carboxymethylcellulose Sodium (EYE DROPS OP) Apply to eye daily.    [provider]  fluticasone (FLONASE) 50 MCG/ACT nasal spray Place 2 sprays into both nostrils daily as needed. 03/23/19   Colon Branch, MD  Ginger, Zingiber officinalis, (GINGER ROOT PO) Take by mouth.    [provider]  Misc Natural Products (TART CHERRY ADVANCED PO) Take by mouth.    [provider]  prednisoLONE acetate (PRED FORTE) 1 % ophthalmic suspension Place 1 drop into both eyes daily. 06/01/18   [provider]  Spacer/Aero-Holding Chambers (AEROCHAMBER MV) inhaler Use as instructed 09/15/12   Noralee Space, MD  TURMERIC PO Take by mouth.    [provider]    Allergies    Etanercept, Methotrexate derivatives, and Pregabalin  Review of Systems   Review of Systems  All other systems reviewed and are negative. Ten systems reviewed and are negative for acute change, except as noted in the HPI.   Physical Exam Updated Vital Signs BP (!) 163/86 (BP Location: Left Arm)   Pulse (!) 55   Temp 98.6 F (37 C) (Oral)   Resp 18   Ht 5\' 9"  (1.753 m)   Wt 59.4 kg   LMP 05/01/2016   SpO2 100%   BMI 19.35 kg/m   Physical Exam Vitals and nursing note reviewed.  Constitutional:      General: She is not in acute distress.    Appearance: Normal appearance. She is not ill-appearing, toxic-appearing or diaphoretic.  HENT:     Head: Normocephalic and atraumatic.     Right Ear: External ear normal.     Left Ear: External ear normal.     Nose: Nose normal.     Mouth/Throat:     Mouth: Mucous membranes are moist.     Pharynx: Oropharynx is clear. No oropharyngeal exudate or posterior oropharyngeal erythema.  Eyes:     Extraocular Movements: Extraocular movements intact.  Neck:     Comments: Moderate TTP noted diffusely along the midline cervical spine  as well as the bilateral cervical paraspinal musculature.  Pain seems to be worst along the musculature of the cervical spine.  Additional moderate TTP  noted along the bilateral trapezii. Cardiovascular:     Rate and Rhythm: Normal rate and regular rhythm.     Pulses: Normal pulses.     Heart sounds: Normal heart sounds. No murmur heard.   No friction rub. No gallop.  Pulmonary:     Effort: Pulmonary effort is normal. No respiratory distress.     Breath sounds: Normal breath sounds. No stridor. No wheezing, rhonchi or rales.  Abdominal:     General: Abdomen is flat. There is no distension.  Musculoskeletal:        General: Normal range of motion.     Cervical back: Normal range of motion and neck supple. Tenderness present.  Skin:    General: Skin is warm and dry.  Neurological:     General: No focal deficit present.     Mental Status: She is alert and oriented to person, place, and time.     Comments: Patient is speaking clearly, coherently, and in complete sentences.  A&O x3.  Strength is 5/5 in all 4 extremities.  Distal sensation intact.  2+ patellar DTRs.  Psychiatric:        Mood and Affect: Mood normal.        Behavior: Behavior normal.    ED Results / Procedures / Treatments   Labs (all labs ordered are listed, but only abnormal results are displayed) Labs Reviewed - No data to display  EKG None  Radiology No results found.  Procedures Procedures   Medications Ordered in ED Medications  methocarbamol (ROBAXIN) tablet 500 mg (500 mg Oral Given 02/24/21 1754)  LORazepam (ATIVAN) injection 1 mg (1 mg Intramuscular Given 02/24/21 1754)    ED Course  I have reviewed the triage vital signs and the nursing notes.  Pertinent labs & imaging results that were available during my care of the patient were reviewed by me and considered in my medical decision making (see chart for details).    MDM Rules/Calculators/A&P                          Pt is a 55 y.o. female  who presents to the emergency department due to muscle spasms in the neck.  Symptoms started last night.  Physical exam significant for diffuse tenderness along the midline cervical spine as well as surrounding paraspinal musculature.  Additional tenderness noted along the bilateral trapezii.  Patient notes a history of similar symptoms in the past.  Symptoms today were treated with Robaxin as well as Ativan in the emergency department.  Physical exam otherwise reassuring.  Strength is 5/5 in all 4 extremities.  Distal sensation intact.  2+ radial and DP pulses noted bilaterally.  No red flags.  Feel the patient is stable for discharge at this time and she is agreeable.  She was given a note for work.  Will discharge on a course of Flexeril.  We discussed safety regarding this medication.  Discussed return precautions.  Recommended PCP follow-up.  Her questions were answered and she was amicable at the time of discharge.  Note: Portions of this report may have been transcribed using voice recognition software. Every effort was made to ensure accuracy; however, inadvertent computerized transcription errors may be present.   Final Clinical Impression(s) / ED Diagnoses Final diagnoses:  Muscle spasms of neck   Rx / DC Orders ED Discharge Orders          Ordered    cyclobenzaprine (FLEXERIL) 10 MG tablet  At  bedtime PRN        02/24/21 1952             Rayna Sexton, Hershal Coria 02/24/21 2012    Luna Fuse, MD 03/05/21 [phone]

## 2021-02-24 NOTE — ED Triage Notes (Signed)
Pt c/o neck pain/spasms started last night-denies injury-NAD-slow gait

## 2021-02-24 NOTE — Discharge Instructions (Addendum)
I am prescribing you a strong muscle relaxer called flexeril. Please only take this medication once in the evening with dinner. This medication can make you quite drowsy. Do not mix it with alcohol. Do not drive a vehicle after taking it.   If you develop any new or worsening symptoms please come back to the emergency department.  Please follow-up with your regular doctor and schedule an appointment for reevaluation.  It was a pleasure to meet you.

## 2021-02-24 NOTE — Telephone Encounter (Signed)
Patient contacted the office stating she is having a severe muscle spasm in her neck. Patient asked if we could prescribe her something. Patient advised our providers are out of the office for the afternoon and if her pain is that severe she would need to seek evaluation at urgent care. Patient expressed understanding.

## 2021-02-25 ENCOUNTER — Encounter: Payer: Self-pay | Admitting: Internal Medicine

## 2021-02-25 ENCOUNTER — Ambulatory Visit (INDEPENDENT_AMBULATORY_CARE_PROVIDER_SITE_OTHER): Payer: 59 | Admitting: Internal Medicine

## 2021-02-25 ENCOUNTER — Telehealth: Payer: Self-pay | Admitting: Internal Medicine

## 2021-02-25 ENCOUNTER — Ambulatory Visit (HOSPITAL_BASED_OUTPATIENT_CLINIC_OR_DEPARTMENT_OTHER)
Admission: RE | Admit: 2021-02-25 | Discharge: 2021-02-25 | Disposition: A | Discharge status: Home / Self Care | Payer: 59 | Source: Ambulatory Visit | Attending: Internal Medicine | Admitting: Internal Medicine

## 2021-02-25 VITALS — BP 122/84 | HR 84 | Temp 98.2°F | Resp 16 | Ht 68.0 in | Wt 130.2 lb

## 2021-02-25 DIAGNOSIS — M542 Cervicalgia: Secondary | ICD-10-CM | POA: Insufficient documentation

## 2021-02-25 MED ORDER — AMLODIPINE BESYLATE 2.5 MG PO TABS: 2.5000 mg | ORAL_TABLET | Freq: Every day | ORAL | 1 refills | Status: DC

## 2021-02-25 MED ORDER — BACLOFEN 5 MG PO TABS: 5.0000 mg | ORAL_TABLET | Freq: Three times a day (TID) | ORAL | 0 refills | Status: DC | PRN

## 2021-02-25 MED ORDER — PREDNISONE 10 MG PO TABS: ORAL_TABLET | ORAL | 0 refills | Status: DC

## 2021-02-25 NOTE — Telephone Encounter (Signed)
[facility] called to inform Brittany Hodges they adjusted patient medication from 10 MG to 20 MG. predniSONE (DELTASONE) 10 MG tablet First 3 days patient will take 1 1/2 pills. Next 3 days 1 pill. Remaining 3 days 1/2. If any new questions develop please contact [phone].

## 2021-02-25 NOTE — Patient Instructions (Addendum)
Get the x-rays  Tylenol  500 mg OTC 2 tabs a day every 8 hours as needed for pain Or IBUPROFEN (Advil or Motrin) 200 mg 1 or 2 tablets every 12  hours as needed for pain.  Always take it with food because may cause gastritis and ulcers.  If you notice nausea, stomach pain, change in the color of stools --->  Stop the medicine and let us know  Warm compress  Baclofen, muscle relaxant.  Call if not gradually better

## 2021-02-25 NOTE — Assessment & Plan Note (Signed)
Acute neck pain: As described above, no evidence of myelopathy, no trauma, neuro exam benign. She has a history of rheumatoid arthritis currently on no treatment. Did not get better with methocarbamol provided at the ER. Plan: Baclofen 5 mg 1 or 2 tid prn Tylenol or ibuprofen with GI precautions Prednisone X-ray Definitely call if not gradually better

## 2021-02-25 NOTE — Telephone Encounter (Signed)
Noted  

## 2021-02-25 NOTE — Telephone Encounter (Signed)
FYI

## 2021-02-25 NOTE — Progress Notes (Signed)
Subjective:    Patient ID: Brittany Hodges, female    DOB: 01-28-1966, 55 y.o.   MRN: [id number]  DOS:  02/25/2021 Type of visit - description: ER follow-up Went to the ER yesterday: Chief complaint was atraumatic neck pain Symptoms a started 3 days ago, initially very mild, now they are more intense. Pain is located at the posterior to the neck and radiate to the upper shoulders bilaterally. Does not radiate to the upper or lower extremities. The pain is described as a steady ache and when she tries to move her neck, then she has a spasm.  Denies any other unusual aches or pains. No injury or fall Has not changed her activities lately.  Denies any upper or lower extremity paresthesias.  Review of Systems See above   Past Medical History:  Diagnosis Date   Allergic rhinitis, cause unspecified    Anemia    Asthma    Atrial septal defect    s/p repair;   Echo 08/07/10: EF 55-60%; mild MR; atrial septal aneurysm; no residual ASD   Chest pain, unspecified    cardiac cath 07/31/10: normal coronaries; vigorous LVF   Esophageal reflux    Fibromyalgia    Headache(784.0)    Hematuria, unspecified    Hypertension    no meds    Hypothyroidism    Irritable bowel syndrome    Low back pain    Rheumatoid arthritis(714.0)    Dr Herold Harms   Scoliosis    Sinoatrial node dysfunction (HCC)    eval. for chronotropic competence completed in past    Past Surgical History:  Procedure Laterality Date   ASD [place]      Allergies as of 02/25/2021       Reactions   Etanercept    Methotrexate Derivatives    Caused her WBC to drop   Pregabalin         Medication List        Accurate as of February 25, 2021  8:02 PM. If you have any questions, ask your nurse or doctor.          STOP taking these medications    cyclobenzaprine 10 MG tablet Commonly known as: FLEXERIL Stopped by: Kathlene November, MD       TAKE  these medications    AeroChamber MV inhaler Use as instructed   albuterol 108 (90 Base) MCG/ACT inhaler Commonly known as: ProAir HFA Inhale 2 puffs into the lungs every 6 (six) hours as needed. For shortness of breath and wheezing   amLODipine 2.5 MG tablet Commonly known as: NORVASC Take 1 tablet (2.5 mg total) by mouth daily.   amoxicillin 500 MG capsule Commonly known as: AMOXIL Take 500 mg by mouth 3 (three) times daily. Prior to dental appointments   Baclofen 5 MG Tabs Take 5-10 mg by mouth 3 (three) times daily as needed. Started by: Kathlene November, MD   BLACK CURRANT SEED OIL PO Take by mouth daily.   EYE DROPS OP Apply to eye daily.   fluticasone 50 MCG/ACT nasal spray Commonly known as: FLONASE Place 2 sprays into both nostrils daily as needed.   GINGER ROOT PO Take by mouth.   prednisoLONE acetate 1 % ophthalmic suspension Commonly known as: PRED FORTE Place 1 drop into both eyes daily.   predniSONE 10 MG tablet Commonly known as: DELTASONE 3 tabs x 3 days, 2 tabs x 3 days, 1  tab x 3 days Started by: Kathlene November, MD   TART CHERRY ADVANCED PO Take by mouth.   TURMERIC PO Take by mouth.           Objective:   Physical Exam BP 122/84 (BP Location: Left Arm, Patient Position: Sitting, Cuff Size: Small)   Pulse 84   Temp 98.2 F (36.8 C) (Oral)   Resp 16   Ht 5\' 8"  (1.727 m)   Wt 130 lb 4 oz (59.1 kg)   LMP 05/01/2016   SpO2 98%   BMI 19.80 kg/m  General:   Well developed, NAD, BMI noted. HEENT:  Normocephalic . Face symmetric, atraumatic Neck: Slightly TTP at the C2. Flexion extension is limited She can rotate her head to the left okay, somewhat limited to the right. No thyromegaly, neck is symmetric.   Lower extremities: no pretibial edema bilaterally  Skin: Not pale. Not jaundice Neurologic:  alert & oriented X3.  Speech normal, gait appropriate for age and unassisted. Motor and DTR symmetric. Psych--  Cognition and judgment appear  intact.  Cooperative with normal attention span and concentration.  Behavior appropriate. No anxious or depressed appearing.      Assessment     Assessment   Prediabetes: A1c 6.0 (01-2017) HTN H/o Hypothyroidism (took meds at some point) GERD- dysphagia -Dr. Norville Haggard EGD from 04/02/2014 (Dr Delrae Alfred): Narrowing of proximal esophagus, question of extrinsic extrinsic compression, EUS showing no intrinsic esophageal mass but the spine was seen in close proximity  to the esophagus probably resulting in extrinsic compression. The endoscopist recommend repeat CT neck and chest in 3 months.saw ENT 04-2015 for dysphagia,had a scope,  rx PPI Asthma, uses alb rarely  CV: --Atrial septal defect: Status post repair, no residual ASD: Needs ABX prophylaxis preprocedures --Sinoatrial  Dysfunction, sees Dr Caryl Comes   --CP: cath 2012, normal coronaries MSK: --Rheumatoid arthritis,   Dr. Herold Harms + rheumatoid factor +CCP,   h/o  low WBC with Methotrexate,   injection site reaction to Enbrel  , self d/c Humira ~ 12/2018 --Osteopenia: T score -1.7) 05/27/2018) IBS (Miralax prn constipation)   Admission: syncope 04-2016 (unlikely to be arrhythmia related per cards ) History of fibromyalgia  PLAN:  Acute neck pain: As described above, no evidence of myelopathy, no trauma, neuro exam benign. She has a history of rheumatoid arthritis currently on no treatment. Did not get better with methocarbamol provided at the ER. Plan: Baclofen 5 mg 1 or 2 tid prn Tylenol or ibuprofen with GI precautions Prednisone X-ray Definitely call if not gradually better    This visit occurred during the SARS-CoV-2 public health emergency.  Safety protocols were in place, including screening questions prior to the visit, additional usage of staff PPE, and extensive cleaning of exam room while observing appropriate contact time as indicated for disinfecting solutions.

## 2021-02-26 ENCOUNTER — Ambulatory Visit: Payer: 59 | Admitting: Internal Medicine

## 2021-02-26 NOTE — Addendum Note (Signed)
Addended by: Kathlene November E on: 02/26/2021 12:17 PM   Modules accepted: Level of Service

## 2021-02-27 ENCOUNTER — Telehealth: Payer: Self-pay | Admitting: Internal Medicine

## 2021-02-27 MED ORDER — HYDROCODONE-ACETAMINOPHEN 5-325 MG PO TABS: 1.0000 | ORAL_TABLET | Freq: Three times a day (TID) | ORAL | 0 refills | Status: DC | PRN

## 2021-02-27 NOTE — Telephone Encounter (Signed)
Please advise 

## 2021-02-27 NOTE — Telephone Encounter (Signed)
Pt. Called in and stated neck is not feeling better at all. It is hard for her to sleep and get up.

## 2021-02-27 NOTE — Telephone Encounter (Signed)
X-ray pending. In addition to what I prescribed, I sent a small amount of hydrocodone (PDMP okay), take it as needed, watch for excessive somnolence, this will not be a long-term medication. If not better in the next few days will need to see Ortho.

## 2021-02-27 NOTE — Telephone Encounter (Signed)
Spoke w/ Pt- informed of recommendations. Pt verbalized understanding.  

## 2021-02-28 ENCOUNTER — Encounter: Payer: Self-pay | Admitting: Internal Medicine

## 2021-03-04 ENCOUNTER — Other Ambulatory Visit: Payer: Self-pay

## 2021-03-04 ENCOUNTER — Encounter: Payer: Self-pay | Admitting: Orthopaedic Surgery

## 2021-03-04 ENCOUNTER — Ambulatory Visit (INDEPENDENT_AMBULATORY_CARE_PROVIDER_SITE_OTHER): Payer: 59 | Admitting: Orthopaedic Surgery

## 2021-03-04 DIAGNOSIS — S161XXA Strain of muscle, fascia and tendon at neck level, initial encounter: Secondary | ICD-10-CM | POA: Diagnosis not present

## 2021-03-04 MED ORDER — DICLOFENAC SODIUM 75 MG PO TBEC: 75.0000 mg | DELAYED_RELEASE_TABLET | Freq: Two times a day (BID) | ORAL | 2 refills | Status: DC | PRN

## 2021-03-04 NOTE — Progress Notes (Signed)
Office Visit Note   Patient: Brittany Hodges           Date of Birth: November 02, 1965           MRN: [id number] Visit Date: 03/04/2021              Requested by: Colon Branch, [address] [place],  [place] [address] PCP: Colon Branch, MD   Assessment & Plan: Visit Diagnoses:  1. Strain of neck muscle, initial encounter     Plan: Impression is right-sided cervical spine strain with underlying advanced osteoarthritis C4-6.  At this point, she will finish out the remaining steroids and then start Voltaren which I have gone ahead and called in.  I have also provided her with a handwritten copy of a physical therapy prescription as she works at a facility for [employer] and thinks she may be able to get in there.  If her symptoms have not improved over the next 6 to 8 weeks she will call and let us know we may entertain ordering an MRI of the cervical spine.  Otherwise, follow-up with [place] as needed.  Follow-Up Instructions: Return if symptoms worsen or fail to improve.   Orders:  No orders of the defined types were placed in this encounter.  Meds ordered this encounter  Medications   diclofenac (VOLTAREN) 75 MG EC tablet    Sig: Take 1 tablet (75 mg total) by mouth 2 (two) times daily as needed.    Dispense:  60 tablet    Refill:  2      Procedures: No procedures performed   Clinical Data: No additional findings.   Subjective: Chief Complaint  Patient presents with   Neck - Pain    HPI patient is a pleasant 55 year old female who comes in today with neck pain for the past 1 to 2 weeks.  No specific injury or change in activity, she just woke up with the pain on the Saturday.  By that Monday her pain had significantly worsened and she was seen at [facility].  X-rays were obtained which showed moderate degenerative changes C4-6.  She was started on a steroid taper and muscle relaxer which has helped quite a bit.  She still complaining of pain to the right  lateral neck and into the parascapular region.  Pain is worse with rotation of the neck.  She notes that the muscle spasms have somewhat resolved since starting the Robaxin.  She denies any paresthesias or weakness to either upper extremity.  No previous ESI.  She has not been to physical therapy.  Review of Systems as detailed in HPI.  All others reviewed and are negative.   Objective: Vital Signs: LMP 05/01/2016   Physical Exam well-developed well-nourished female in no acute distress.  Alert and oriented x3.  Ortho Exam cervical spine exam shows mild spinous and right-sided paraspinous tenderness.  Increased pain with cervical spine rotation and extension.  No focal weakness.  She is neurovascular intact distally.  Specialty Comments:  No specialty comments available.  Imaging: No new imaging   PMFS History: Patient Active Problem List   Diagnosis Date Noted   Mild intermittent asthma 12/22/2020   Chronic sinusitis 09/14/2019   Intolerance, food 09/14/2019   Asthmatic bronchitis 04/06/2016   Laryngopharyngeal reflux (LPR) 04/06/2016   PCP NOTES >>>>> 03/09/2015   Annual physical exam 12/21/2014   Cardiomegaly, by MRI 03/16/2014   Leukopenia 03/10/2014   Lightheadedness 02/10/2011  SINOATRIAL NODE DYSFUNCTION 08/06/2008   ATRIAL SEPTAL DEFECT 06/29/2007   Hypothyroidism 03/21/2007   Essential hypertension 03/21/2007   Perennial allergic rhinitis 03/21/2007   GERD-- dysphagia 03/21/2007   CONSTIPATION 03/21/2007   IRRITABLE BOWEL SYNDROME 03/21/2007   RA (rheumatoid arthritis) ([place]) 03/21/2007   LOW BACK PAIN 03/21/2007   FIBROMYALGIA 03/21/2007   Past Medical History:  Diagnosis Date   Allergic rhinitis, cause unspecified    Anemia    Asthma    Atrial septal defect    s/p repair;   Echo 08/07/10: EF 55-60%; mild MR; atrial septal aneurysm; no residual ASD   Chest pain, unspecified    cardiac cath 07/31/10: normal coronaries; vigorous LVF   Esophageal reflux     Fibromyalgia    Headache(784.0)    Hematuria, unspecified    Hypertension    no meds    Hypothyroidism    Irritable bowel syndrome    Low back pain    Rheumatoid arthritis(714.0)    Dr Herold Harms   Scoliosis    Sinoatrial node dysfunction (HCC)    eval. for chronotropic competence completed in past    Family History  Problem Relation Age of Onset   Hypertension Mother    Diabetes Mother    Kidney failure Mother    Heart disease Maternal Grandmother    Stomach cancer Maternal Grandmother 28   Rheum arthritis Maternal Grandmother    Glaucoma Father    Breast cancer Other        aunt   Diabetes Other        GM   Hypertension Sister    Thyroid disease Sister    Hypertension Sister    Thyroid disease Sister    Asthma Son    Eczema Son    Sinusitis Daughter    Colon cancer Neg Hx    Allergic rhinitis Neg Hx    Angioedema Neg Hx    Immunodeficiency Neg Hx     Past Surgical History:  Procedure Laterality Date   ASD REPAIR  1985   TONSILLECTOMY AND ADENOIDECTOMY     TUBAL LIGATION     Social History   Occupational History   Occupation: HR assistant   Tobacco Use   Smoking status: Never   Smokeless tobacco: Never   Tobacco comments:    never used tobacco  Vaping Use   Vaping Use: Never used  Substance and Sexual Activity   Alcohol use: No   Drug use: No   Sexual activity: Not Currently    Birth control/protection: Surgical

## 2021-03-04 NOTE — Telephone Encounter (Signed)
Per [employer] rep, they will have to submit IT ticket to change reimbursement type to debit card instead of EFT in portal. State this can take up to 3 business days. Will re-check portal on 03/10/21

## 2021-03-04 NOTE — Telephone Encounter (Signed)
Called [employer] In-office Injection program rep to enroll into Debit Card to prevent further delay in starting patient on therapy.  Reps busy and state they will return call. Provided direct office number at rheum clinic and afternoon pulm clinic

## 2021-03-06 ENCOUNTER — Ambulatory Visit (INDEPENDENT_AMBULATORY_CARE_PROVIDER_SITE_OTHER): Payer: 59 | Admitting: Nurse Practitioner

## 2021-03-06 ENCOUNTER — Other Ambulatory Visit: Payer: Self-pay

## 2021-03-06 VITALS — BP 156/78 | HR 48 | Temp 98.2°F | Resp 10 | Ht 68.0 in | Wt 131.0 lb

## 2021-03-06 DIAGNOSIS — R001 Bradycardia, unspecified: Secondary | ICD-10-CM

## 2021-03-06 DIAGNOSIS — H8110 Benign paroxysmal vertigo, unspecified ear: Secondary | ICD-10-CM

## 2021-03-06 DIAGNOSIS — R42 Dizziness and giddiness: Secondary | ICD-10-CM | POA: Diagnosis not present

## 2021-03-06 MED ORDER — MECLIZINE HCL 12.5 MG PO TABS: 12.5000 mg | ORAL_TABLET | Freq: Three times a day (TID) | ORAL | 0 refills | Status: DC | PRN

## 2021-03-06 NOTE — Progress Notes (Signed)
Acute Office Visit  Subjective:    Patient ID: Brittany Hodges, female    DOB: 1966-01-17, 55 y.o.   MRN: [id number]  Chief Complaint  Patient presents with   Neck Pain    Started on 02/22/21. Had some neck spasms were present at first. But now just an achy tight sensation on the right side mainly. Patient saw Dr Erlinda Hong on 03/04/21 and was given Diclofenac but has not taking that. Started having like a "floating feeling with walking" earlier this week, unstable feeling. She is not sure if this may be related to neck problem. No injury.     Patient is in today for "floating sensation"  Unsure if it is related to her neck. States it is like off balance. Most of the day. Last less than a minute, worse with position. Dr. Cleda Mccreedy. Once a year. (Cardiology) This week is when the symptoms started. Pee almost clear.  Generally goes to the gym. No history of heart attack, stroke. Is bradycardic and followed by cardiology  Neck pain has been evaluated by PCP and Orthopedist. She was concerned that it may have something to do with her symptoms. She has not tried the medication that the orthopedist has written yet  Past Medical History:  Diagnosis Date   Allergic rhinitis, cause unspecified    Anemia    Asthma    Atrial septal defect    s/p repair;   Echo 08/07/10: EF 55-60%; mild MR; atrial septal aneurysm; no residual ASD   Chest pain, unspecified    cardiac cath 07/31/10: normal coronaries; vigorous LVF   Esophageal reflux    Fibromyalgia    Headache(784.0)    Hematuria, unspecified    Hypertension    no meds    Hypothyroidism    Irritable bowel syndrome    Low back pain    Rheumatoid arthritis(714.0)    Dr Herold Harms   Scoliosis    Sinoatrial node dysfunction (HCC)    eval. for chronotropic competence completed in past    Past Surgical History:  Procedure Laterality Date   ASD REPAIR  1985   TONSILLECTOMY AND ADENOIDECTOMY     TUBAL LIGATION      Family History  Problem  Relation Age of Onset   Hypertension Mother    Diabetes Mother    Kidney failure Mother    Heart disease Maternal Grandmother    Stomach cancer Maternal Grandmother 66   Rheum arthritis Maternal Grandmother    Glaucoma Father    Breast cancer Other        aunt   Diabetes Other        GM   Hypertension Sister    Thyroid disease Sister    Hypertension Sister    Thyroid disease Sister    Asthma Son    Eczema Son    Sinusitis Daughter    Colon cancer Neg Hx    Allergic rhinitis Neg Hx    Angioedema Neg Hx    Immunodeficiency Neg Hx     Social History   Socioeconomic History   Marital status: Single    Spouse name: Not on file   Number of children: 3   Years of education: Not on file   Highest education level: Not on file  Occupational History   Occupation: HR assistant   Tobacco Use   Smoking status: Never   Smokeless tobacco: Never   Tobacco comments:    never used tobacco  Vaping Use   Vaping Use: Never  used  Substance and Sexual Activity   Alcohol use: No   Drug use: No   Sexual activity: Not Currently    Birth control/protection: Surgical  Other Topics Concern   Not on file  Social History Narrative   Household-- lives by herself   Social Determinants of Health   Financial Resource Strain: Not on file  Food Insecurity: Not on file  Transportation Needs: Not on file  Physical Activity: Not on file  Stress: Not on file  Social Connections: Not on file  Intimate Partner Violence: Not on file    Outpatient Medications Prior to Visit  Medication Sig Dispense Refill   albuterol (PROAIR HFA) 108 (90 Base) MCG/ACT inhaler Inhale 2 puffs into the lungs every 6 (six) hours as needed. For shortness of breath and wheezing 1 Inhaler 6   amLODipine (NORVASC) 2.5 MG tablet Take 1 tablet (2.5 mg total) by mouth daily. 90 tablet 1   BLACK CURRANT SEED OIL PO Take by mouth daily.     Carboxymethylcellulose Sodium (EYE DROPS OP) Apply to eye daily.     fluticasone  (FLONASE) 50 MCG/ACT nasal spray Place 2 sprays into both nostrils daily as needed. 16 g 6   Ginger, Zingiber officinalis, (GINGER ROOT PO) Take by mouth.     Misc Natural Products (TART CHERRY ADVANCED PO) Take by mouth.     prednisoLONE acetate (PRED FORTE) 1 % ophthalmic suspension Place 1 drop into both eyes daily.     Spacer/Aero-Holding Chambers (AEROCHAMBER MV) inhaler Use as instructed 1 each 0   TURMERIC PO Take by mouth.     amoxicillin (AMOXIL) 500 MG capsule Take 500 mg by mouth 3 (three) times daily. Prior to dental appointments (Patient not taking: No sig reported)     Baclofen 5 MG TABS Take 5-10 mg by mouth 3 (three) times daily as needed. (Patient not taking: Reported on 03/06/2021) 30 tablet 0   diclofenac (VOLTAREN) 75 MG EC tablet Take 1 tablet (75 mg total) by mouth 2 (two) times daily as needed. (Patient not taking: Reported on 03/06/2021) 60 tablet 2   HYDROcodone-acetaminophen (NORCO/VICODIN) 5-325 MG tablet Take 1-2 tablets by mouth every 8 (eight) hours as needed. (Patient not taking: Reported on 03/06/2021) 15 tablet 0   predniSONE (DELTASONE) 10 MG tablet 3 tabs x 3 days, 2 tabs x 3 days, 1 tab x 3 days 18 tablet 0   No facility-administered medications prior to visit.    Allergies  Allergen Reactions   Etanercept    Methotrexate Derivatives     Caused her WBC to drop   Pregabalin     Review of Systems  Constitutional:  Negative for chills, fatigue and fever.  Respiratory:  Negative for cough and shortness of breath.   Cardiovascular:  Negative for chest pain and leg swelling.  Gastrointestinal:  Negative for diarrhea, nausea and vomiting.  Musculoskeletal:  Positive for neck pain.  Neurological:  Positive for dizziness.      Objective:    Physical Exam Vitals and nursing note reviewed.  Constitutional:      Appearance: Normal appearance.  HENT:     Right Ear: Tympanic membrane, ear canal and external ear normal. There is no impacted cerumen.      Left Ear: Tympanic membrane, ear canal and external ear normal. There is no impacted cerumen.     Mouth/Throat:     Mouth: Mucous membranes are moist.     Pharynx: Oropharynx is clear.  Eyes:  Extraocular Movements: Extraocular movements intact.     Pupils: Pupils are equal, round, and reactive to light.  Cardiovascular:     Rate and Rhythm: Regular rhythm. Bradycardia present.     Pulses: Normal pulses.  Pulmonary:     Effort: Pulmonary effort is normal.     Breath sounds: Normal breath sounds.  Abdominal:     General: Bowel sounds are normal.  Musculoskeletal:     Right lower leg: No edema.     Left lower leg: No edema.     Comments: Bilateral upper and lower extremity strength 5/5  Neurological:     Mental Status: She is alert.     Cranial Nerves: No cranial nerve deficit.     Sensory: No sensory deficit.     Motor: No weakness or pronator drift.     Coordination: Coordination normal. Finger-Nose-Finger Test normal.     Gait: Gait and tandem walk normal.     Deep Tendon Reflexes: Reflexes normal.     Comments: Cranial nerves II-XII grossly intact  Psychiatric:        Mood and Affect: Mood normal.        Behavior: Behavior normal.        Thought Content: Thought content normal.        Judgment: Judgment normal.    BP (!) 156/78   Pulse (!) 48   Temp 98.2 F (36.8 C)   Resp 10   Ht 5' 8" (1.727 m)   Wt 131 lb (59.4 kg)   LMP 05/01/2016   SpO2 96%   BMI 19.92 kg/m  Wt Readings from Last 3 Encounters:  03/06/21 131 lb (59.4 kg)  02/25/21 130 lb 4 oz (59.1 kg)  02/24/21 131 lb (59.4 kg)    Health Maintenance Due  Topic Date Due   Zoster Vaccines- Shingrix (1 of 2) Never done   Pneumococcal Vaccine 62-36 Years old (3 - PPSV23 if available, else PCV20) 03/07/2020   INFLUENZA VACCINE  12/16/2020    There are no preventive care reminders to display for this patient.   Lab Results  Component Value Date   TSH 4.07 05/28/2020   Lab Results  Component Value  Date   WBC 3.6 (L) 02/18/2021   HGB 12.2 02/18/2021   HCT 39.1 02/18/2021   MCV 90.5 02/18/2021   PLT 144 (L) 02/18/2021   Lab Results  Component Value Date   NA 143 02/18/2021   K 4.7 02/18/2021   CO2 32 02/18/2021   GLUCOSE 90 02/18/2021   BUN 15 02/18/2021   CREATININE 1.19 (H) 02/18/2021   BILITOT 0.4 02/18/2021   ALKPHOS 69 02/18/2021   AST 21 02/18/2021   ALT 13 02/18/2021   PROT 8.1 02/18/2021   ALBUMIN 4.3 02/18/2021   CALCIUM 10.4 (H) 02/18/2021   ANIONGAP 6 02/18/2021   EGFR 80 01/31/2021   GFR 78.73 06/18/2020   Lab Results  Component Value Date   CHOL 134 06/18/2020   Lab Results  Component Value Date   HDL 56.00 06/18/2020   Lab Results  Component Value Date   LDLCALC 66 06/18/2020   Lab Results  Component Value Date   TRIG 59.0 06/18/2020   Lab Results  Component Value Date   CHOLHDL 2 06/18/2020   Lab Results  Component Value Date   HGBA1C 6.1 06/18/2020       Assessment & Plan:   Problem List Items Addressed This Visit       Nervous and Auditory  Benign paroxysmal positional vertigo    Patient states this is baseline for her.  She does have several EKGs within epic that showed her bradycardic.  She is followed by cardiology.  Told her equilibrium sensation could be double fold in regards to sounds more like vertigo but her heart rate not compensating quick enough when she goes from a lying to standing position.  We will try meclizine to see if this helps as it does seem very positional.  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  If not improved she will follow-up with her primary care provider Dr. Larose Kells.  She was concerned that her neck pain was causing this do not think it is due to any type of arterial insufficiency to the brain.  Signs and symptoms reviewed as to when to seek emergent care.      Relevant Medications   meclizine (ANTIVERT) 12.5 MG tablet     Other    Dizziness - Primary   Relevant Orders   EKG 12-Lead (Completed)   Orthostatic vital signs   CBC   Basic metabolic panel   Bradycardia    Patient states this is baseline for her.  She does have several EKGs within epic that showed her bradycardic.  She is followed by cardiology.  Told her equilibrium sensation could be double fold in regards to sounds more like vertigo but her heart rate not compensating quick enough when she goes from a lying to standing position.  We will try meclizine to see if this helps as it does seem very positional.  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  If not improved she will follow-up with her primary care provider Dr. Larose Kells.  She was concerned that her neck pain was causing this do not think it is due to any type of arterial insufficiency to the brain.  Signs and symptoms reviewed as to when to seek emergent care.      Relevant Orders   EKG 12-Lead (Completed)   Basic metabolic panel     No orders of the defined types were placed in this encounter.  This visit occurred during the SARS-CoV-2 public health emergency.  Safety protocols were in place, including screening questions prior to the visit, additional usage of staff PPE, and extensive cleaning of exam room while observing appropriate contact time as indicated for disinfecting solutions.   Romilda Garret, NP

## 2021-03-06 NOTE — Patient Instructions (Signed)
Nice to see you Will send in some meclizine. If it doesn't improve with medication and exercises follow up with Dr. Larose Kells

## 2021-03-06 NOTE — Assessment & Plan Note (Signed)
Patient states this is baseline for her.  She does have several EKGs within epic that showed her bradycardic.  She is followed by cardiology.  Told her equilibrium sensation could be double fold in regards to sounds more like vertigo but her heart rate not compensating quick enough when she goes from a lying to standing position.  We will try meclizine to see if this helps as it does seem very positional.  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  If not improved she will follow-up with her primary care provider Dr. Larose Kells.  She was concerned that her neck pain was causing this do not think it is due to any type of arterial insufficiency to the brain.  Signs and symptoms reviewed as to when to seek emergent care.

## 2021-03-07 LAB — BASIC METABOLIC PANEL
BUN: 14 mg/dL (ref 6–23)
CO2: 28 mEq/L (ref 19–32)
Calcium: 10.3 mg/dL (ref 8.4–10.5)
Chloride: 103 mEq/L (ref 96–112)
Creatinine, Ser: 0.94 mg/dL (ref 0.40–1.20)
GFR: 67.47 mL/min (ref >60.00)
Glucose, Bld: 86 mg/dL (ref 70–99)
Potassium: 4.4 mEq/L (ref 3.5–5.1)
Sodium: 138 mEq/L (ref 135–145)

## 2021-03-07 LAB — CBC
HCT: 39.5 % (ref 36.0–46.0)
Hemoglobin: 12.7 g/dL (ref 12.0–15.0)
MCHC: 32.1 g/dL (ref 30.0–36.0)
MCV: 88.7 fl (ref 78.0–100.0)
Platelets: 182 10*3/uL (ref 150.0–400.0)
RBC: 4.45 Mil/uL (ref 3.87–5.11)
RDW: 12.9 % (ref 11.5–15.5)
WBC: 5.6 10*3/uL (ref 4.0–10.5)

## 2021-03-12 NOTE — Telephone Encounter (Signed)
She was evaluated by hematologist on February 18, 2021.  They released her from their care.  It is okay to start on Cimzia.  Please get labs 3 weeks after the Cimzia dose.

## 2021-03-12 NOTE — Telephone Encounter (Signed)
Patient has been scheduled for 03/20/2021 at 3:00 pm to start Cimzia. Per Dr. Estanislado Pandy patient will not do the loading dose. She will do Cimzia 400 mg monthly. Patient will have labs 1 week prior to her next injection.

## 2021-03-17 ENCOUNTER — Ambulatory Visit (INDEPENDENT_AMBULATORY_CARE_PROVIDER_SITE_OTHER): Payer: 59 | Admitting: *Deleted

## 2021-03-17 ENCOUNTER — Other Ambulatory Visit: Payer: Self-pay

## 2021-03-17 VITALS — BP 134/84 | HR 43

## 2021-03-17 DIAGNOSIS — M0579 Rheumatoid arthritis with rheumatoid factor of multiple sites without organ or systems involvement: Secondary | ICD-10-CM | POA: Diagnosis not present

## 2021-03-17 MED ORDER — CERTOLIZUMAB PEGOL 2 X 200 MG ~~LOC~~ KIT
400.0000 mg | PACK | Freq: Once | SUBCUTANEOUS | Status: AC
  Administered 2021-03-17: 400 mg via SUBCUTANEOUS

## 2021-03-17 NOTE — Progress Notes (Signed)
Subjective:   Patient presents to clinic today to receive monthly dose of Cimzia.  Patient running a fever or have signs/symptoms of infection? No  Patient currently on antibiotics for the treatment of infection? No  Patient have any upcoming invasive procedures/surgeries? No  Objective: CMP     Component Value Date/Time   NA 138 03/06/2021 1452   NA 140 04/15/2016 0000   K 4.4 03/06/2021 1452   CL 103 03/06/2021 1452   CO2 28 03/06/2021 1452   GLUCOSE 86 03/06/2021 1452   BUN 14 03/06/2021 1452   BUN 13 04/15/2016 0000   CREATININE 0.94 03/06/2021 1452   CREATININE 1.19 (H) 02/18/2021 1452   CREATININE 0.85 01/31/2021 0903   CALCIUM 10.3 03/06/2021 1452   PROT 8.1 02/18/2021 1452   PROT 8.4 04/15/2016 0000   ALBUMIN 4.3 02/18/2021 1452   ALBUMIN 4.3 04/15/2016 0000   AST 21 02/18/2021 1452   ALT 13 02/18/2021 1452   ALKPHOS 69 02/18/2021 1452   BILITOT 0.4 02/18/2021 1452   GFRNONAA 53 (L) 02/18/2021 1452   GFRNONAA 77 02/15/2020 1023   GFRAA 89 02/15/2020 1023    CBC    Component Value Date/Time   WBC 5.6 03/06/2021 1452   RBC 4.45 03/06/2021 1452   HGB 12.7 03/06/2021 1452   HGB 12.2 02/18/2021 1452   HGB 12.4 04/15/2016 0000   HGB 11.2 (L) 07/26/2013 0802   HCT 39.5 03/06/2021 1452   HCT 38.3 04/15/2016 0000   HCT 35.5 07/26/2013 0802   PLT 182.0 03/06/2021 1452   PLT 144 (L) 02/18/2021 1452   PLT 189 04/15/2016 0000   MCV 88.7 03/06/2021 1452   MCV 87 04/15/2016 0000   MCV 86 07/26/2013 0802   MCH 28.2 02/18/2021 1452   MCHC 32.1 03/06/2021 1452   RDW 12.9 03/06/2021 1452   RDW 14.0 04/15/2016 0000   RDW 13.6 07/26/2013 0802   LYMPHSABS 1.4 02/18/2021 1452   LYMPHSABS 1.6 04/15/2016 0000   LYMPHSABS 0.9 07/26/2013 0802   MONOABS 0.5 02/18/2021 1452   EOSABS 0.0 02/18/2021 1452   EOSABS 0.1 04/15/2016 0000   EOSABS 0.0 07/26/2013 0802   BASOSABS 0.0 02/18/2021 1452   BASOSABS 0.0 04/15/2016 0000   BASOSABS 0.0 07/26/2013 0802    Baseline  Immunosuppressant Therapy Labs TB GOLD Quantiferon TB Gold Latest Ref Rng & Units 01/31/2021  Quantiferon TB Gold Plus NEGATIVE NEGATIVE   Hepatitis Panel Hepatitis Latest Ref Rng & Units 06/23/2018  Hep B Surface Ag NON-REACTI NON-REACTIVE  Hep B IgM NON-REACTI NON-REACTIVE  Hep C Ab NON-REACTI NON-REACTIVE  Hep C Ab NON-REACTI NON-REACTIVE  Hep A IgM NON-REACTI NON-REACTIVE   HIV Lab Results  Component Value Date   HIV NONREACTIVE 12/21/2014   Immunoglobulins Immunoglobulin Electrophoresis Latest Ref Rng & Units 06/23/2018  IgA  47 - 310 mg/dL 400(H)  IgG 600 - 1,640 mg/dL 2,196(H)  IgM 50 - 300 mg/dL 118   SPEP Serum Protein Electrophoresis Latest Ref Rng & Units 02/18/2021  Total Protein 6.5 - 8.1 g/dL 8.1  Albumin 55.8 - 66.1 % -  Alpha-1 2.9 - 4.9 % -  Alpha-2 7.1 - 11.8 % -  Beta Globulin 4.7 - 7.2 % -  Beta 2 3.2 - 6.5 % -  Gamma Globulin 11.1 - 18.8 % -   G6PD No results found for: G6PDH TPMT No results found for: TPMT   Chest x-ray: 09/16/2020 Stable exam without acute or active cardiopulmonary disease.  Assessment/Plan:   Administrations This Visit  Certolizumab Pegol KIT 400 mg     Admin Date 03/17/2021 Action Given Dose 400 mg Route Subcutaneous Administered By Carole Binning, LPN             Patient tolerated injection well. Patient monitored in office for 30 minutes for adverse reactions. No adverse reactions noted.   Appointment for next injection scheduled for 04/14/2021.  Patient due for labs in 3 weeks per Dr.Deveshwar.  Patient is to call and reschedule appointment if running a fever with signs/symptoms of infection, on antibiotics for active infection or has an upcoming invasive procedure.  All questions encouraged and answered.  Instructed patient to call with any further questions or concerns.

## 2021-03-18 ENCOUNTER — Encounter: Payer: Self-pay | Admitting: *Deleted

## 2021-03-20 ENCOUNTER — Ambulatory Visit: Payer: 59

## 2021-03-20 ENCOUNTER — Other Ambulatory Visit: Payer: Self-pay | Admitting: Internal Medicine

## 2021-04-07 ENCOUNTER — Other Ambulatory Visit: Payer: Self-pay | Admitting: *Deleted

## 2021-04-07 DIAGNOSIS — Z79899 Other long term (current) drug therapy: Secondary | ICD-10-CM

## 2021-04-07 DIAGNOSIS — D708 Other neutropenia: Secondary | ICD-10-CM

## 2021-04-09 LAB — CMP14+EGFR
ALT: 12 IU/L (ref 0–32)
AST: 19 IU/L (ref 0–40)
Albumin/Globulin Ratio: 1.3 (ref 1.2–2.2)
Albumin: 4.5 g/dL (ref 3.8–4.9)
Alkaline Phosphatase: 74 IU/L (ref 44–121)
BUN/Creatinine Ratio: 18 (ref 9–23)
BUN: 15 mg/dL (ref 6–24)
Bilirubin Total: 0.3 mg/dL (ref 0.0–1.2)
CO2: 27 mmol/L (ref 20–29)
Calcium: 10.1 mg/dL (ref 8.7–10.2)
Chloride: 104 mmol/L (ref 96–106)
Creatinine, Ser: 0.84 mg/dL (ref 0.57–1.00)
Globulin, Total: 3.4 g/dL (ref 1.5–4.5)
Glucose: 80 mg/dL (ref 70–99)
Potassium: 4.5 mmol/L (ref 3.5–5.2)
Sodium: 144 mmol/L (ref 134–144)
Total Protein: 7.9 g/dL (ref 6.0–8.5)
eGFR: 81 mL/min/{1.73_m2} (ref >59)

## 2021-04-09 LAB — CBC WITH DIFFERENTIAL/PLATELET
Basophils Absolute: 0 10*3/uL (ref 0.0–0.2)
Basos: 1 %
EOS (ABSOLUTE): 0 10*3/uL (ref 0.0–0.4)
Eos: 1 %
Hematocrit: 39.3 % (ref 34.0–46.6)
Hemoglobin: 12.3 g/dL (ref 11.1–15.9)
Immature Grans (Abs): 0 10*3/uL (ref 0.0–0.1)
Immature Granulocytes: 0 %
Lymphocytes Absolute: 2 10*3/uL (ref 0.7–3.1)
Lymphs: 48 %
MCH: 28.1 pg (ref 26.6–33.0)
MCHC: 31.3 g/dL — ABNORMAL LOW (ref 31.5–35.7)
MCV: 90 fL (ref 79–97)
Monocytes Absolute: 0.6 10*3/uL (ref 0.1–0.9)
Monocytes: 14 %
Neutrophils Absolute: 1.4 10*3/uL (ref 1.4–7.0)
Neutrophils: 36 %
Platelets: 140 10*3/uL — ABNORMAL LOW (ref 150–450)
RBC: 4.38 x10E6/uL (ref 3.77–5.28)
RDW: 12 % (ref 11.7–15.4)
WBC: 4 10*3/uL (ref 3.4–10.8)

## 2021-04-09 NOTE — Progress Notes (Signed)
CMP WNL.  WBC count is WNL.  No anemia noted.  Plt count remains borderline low but stable. We will continue to monitor lab work closely with injections.

## 2021-04-14 ENCOUNTER — Other Ambulatory Visit: Payer: Self-pay

## 2021-04-14 ENCOUNTER — Ambulatory Visit (INDEPENDENT_AMBULATORY_CARE_PROVIDER_SITE_OTHER): Payer: 59 | Admitting: *Deleted

## 2021-04-14 VITALS — BP 147/76 | HR 43

## 2021-04-14 DIAGNOSIS — M0579 Rheumatoid arthritis with rheumatoid factor of multiple sites without organ or systems involvement: Secondary | ICD-10-CM | POA: Diagnosis not present

## 2021-04-14 MED ORDER — CERTOLIZUMAB PEGOL 2 X 200 MG ~~LOC~~ KIT
400.0000 mg | PACK | Freq: Once | SUBCUTANEOUS | Status: AC
  Administered 2021-04-14: 400 mg via SUBCUTANEOUS

## 2021-04-14 NOTE — Progress Notes (Signed)
Subjective:   Patient presents to clinic today to receive monthly dose of Cimzia.  Patient running a fever or have signs/symptoms of infection? No  Patient currently on antibiotics for the treatment of infection? No  Patient have any upcoming invasive procedures/surgeries? No  Objective: CMP     Component Value Date/Time   NA 144 04/08/2021 1216   K 4.5 04/08/2021 1216   CL 104 04/08/2021 1216   CO2 27 04/08/2021 1216   GLUCOSE 80 04/08/2021 1216   GLUCOSE 86 03/06/2021 1452   BUN 15 04/08/2021 1216   CREATININE 0.84 04/08/2021 1216   CREATININE 1.19 (H) 02/18/2021 1452   CREATININE 0.85 01/31/2021 0903   CALCIUM 10.1 04/08/2021 1216   PROT 7.9 04/08/2021 1216   ALBUMIN 4.5 04/08/2021 1216   AST 19 04/08/2021 1216   AST 21 02/18/2021 1452   ALT 12 04/08/2021 1216   ALT 13 02/18/2021 1452   ALKPHOS 74 04/08/2021 1216   BILITOT 0.3 04/08/2021 1216   BILITOT 0.4 02/18/2021 1452   GFRNONAA 53 (L) 02/18/2021 1452   GFRNONAA 77 02/15/2020 1023   GFRAA 89 02/15/2020 1023    CBC    Component Value Date/Time   WBC 4.0 04/08/2021 1216   WBC 5.6 03/06/2021 1452   RBC 4.38 04/08/2021 1216   RBC 4.45 03/06/2021 1452   HGB 12.3 04/08/2021 1216   HGB 11.2 (L) 07/26/2013 0802   HCT 39.3 04/08/2021 1216   HCT 35.5 07/26/2013 0802   PLT 140 (L) 04/08/2021 1216   MCV 90 04/08/2021 1216   MCV 86 07/26/2013 0802   MCH 28.1 04/08/2021 1216   MCH 28.2 02/18/2021 1452   MCHC 31.3 (L) 04/08/2021 1216   MCHC 32.1 03/06/2021 1452   RDW 12.0 04/08/2021 1216   RDW 13.6 07/26/2013 0802   LYMPHSABS 2.0 04/08/2021 1216   LYMPHSABS 0.9 07/26/2013 0802   MONOABS 0.5 02/18/2021 1452   EOSABS 0.0 04/08/2021 1216   EOSABS 0.0 07/26/2013 0802   BASOSABS 0.0 04/08/2021 1216   BASOSABS 0.0 07/26/2013 0802    Baseline Immunosuppressant Therapy Labs TB GOLD Quantiferon TB Gold Latest Ref Rng & Units 01/31/2021  Quantiferon TB Gold Plus NEGATIVE NEGATIVE   Hepatitis Panel Hepatitis  Latest Ref Rng & Units 06/23/2018  Hep B Surface Ag NON-REACTI NON-REACTIVE  Hep B IgM NON-REACTI NON-REACTIVE  Hep C Ab NON-REACTI NON-REACTIVE  Hep C Ab NON-REACTI NON-REACTIVE  Hep A IgM NON-REACTI NON-REACTIVE   HIV Lab Results  Component Value Date   HIV NONREACTIVE 12/21/2014   Immunoglobulins Immunoglobulin Electrophoresis Latest Ref Rng & Units 06/23/2018  IgA  47 - 310 mg/dL 400(H)  IgG 600 - 1,640 mg/dL 2,196(H)  IgM 50 - 300 mg/dL 118   SPEP Serum Protein Electrophoresis Latest Ref Rng & Units 04/08/2021  Total Protein 6.0 - 8.5 g/dL 7.9  Albumin 55.8 - 66.1 % -  Alpha-1 2.9 - 4.9 % -  Alpha-2 7.1 - 11.8 % -  Beta Globulin 4.7 - 7.2 % -  Beta 2 3.2 - 6.5 % -  Gamma Globulin 11.1 - 18.8 % -   G6PD No results found for: G6PDH TPMT No results found for: TPMT   Chest x-ray: 09/16/2020 Stable exam without acute or active cardiopulmonary disease  Assessment/Plan:   Administrations This Visit     Certolizumab Pegol KIT 400 mg     Admin Date 04/14/2021 Action Given Dose 400 mg Route Subcutaneous Administered By Carole Binning, LPN  Patient tolerated injection well.   Appointment for next injection scheduled for 05/14/2021.  Patient due for labs in December 2022.  Patient is to call and reschedule appointment if running a fever with signs/symptoms of infection, on antibiotics for active infection or has an upcoming invasive procedure.  All questions encouraged and answered.  Instructed patient to call with any further questions or concerns.

## 2021-04-15 ENCOUNTER — Encounter: Payer: Self-pay | Admitting: *Deleted

## 2021-04-16 NOTE — Progress Notes (Signed)
Office Visit Note  Patient: Brittany Hodges             Date of Birth: 1965-09-04           MRN: [id number]             PCP: Colon Branch, MD Referring: Colon Branch, MD Visit Date: 04/30/2021 Occupation: @GUAROCC @  Subjective:  Right eye pain and redness   History of Present Illness: Brittany Hodges is a 55 y.o. female with history of seropositive rheumatoid arthritis, iritis, osteoarthritis, and DDD.  Patient is currently on Cimzia 400 mg sq injections every 4 weeks.  Her first dose of Cimzia was administered on 03/17/2021 followed by the second dose on 04/14/2021.  She has been tolerating Cimzia without any side effects or injection site reactions.  She reports that she was evaluated by Dr. Sabra Heck her ophthalmologist on Friday, 04/25/2021 at which time she had inflammation in the left eye due to underlying iritis.  She was advised to continue prednisolone eyedrops in the left eye.  According to the patient and her right eyes she was told that she had irritation/inflammation from allergic conjunctivitis.  She states that yesterday she started to experience pain and redness in the right eye.  She has not tried using the prednisolone drops in her right eye yet.  She has an appointment with Dr. Sabra Heck today for further evaluation and management. She states that today she has noticed some increased aching in her joints.  She is having some swelling in her right fifth finger at the DIP joint. She denies any infections since starting on Cimzia.     Activities of Daily Living:  Patient reports morning stiffness for 0 minutes.   Patient Reports nocturnal pain.  Difficulty dressing/grooming: Denies Difficulty climbing stairs: Denies Difficulty getting out of chair: Denies Difficulty using hands for taps, buttons, cutlery, and/or writing: Denies  Review of Systems  Constitutional:  Negative for fatigue.  HENT:  Negative for mouth sores, mouth dryness and nose dryness.   Eyes:  Positive  for redness. Negative for pain, itching and dryness.  Respiratory:  Negative for shortness of breath and difficulty breathing.   Cardiovascular:  Negative for chest pain and palpitations.  Gastrointestinal:  Negative for blood in stool, constipation and diarrhea.  Endocrine: Negative for increased urination.  Genitourinary:  Negative for difficulty urinating.  Musculoskeletal:  Positive for joint pain and joint pain. Negative for joint swelling, myalgias, morning stiffness, muscle tenderness and myalgias.  Skin:  Negative for color change, rash and redness.  Allergic/Immunologic: Negative for susceptible to infections.  Neurological:  Negative for dizziness, numbness, headaches, memory loss and weakness.  Hematological:  Negative for bruising/bleeding tendency.  Psychiatric/Behavioral:  Negative for confusion.    PMFS History:  Patient Active Problem List   Diagnosis Date Noted   Bradycardia 03/06/2021   Benign paroxysmal positional vertigo 03/06/2021   Mild intermittent asthma 12/22/2020   Chronic sinusitis 09/14/2019   Intolerance, food 09/14/2019   Asthmatic bronchitis 04/06/2016   Laryngopharyngeal reflux (LPR) 04/06/2016   PCP NOTES >>>>> 03/09/2015   Annual physical exam 12/21/2014   Cardiomegaly, by MRI 03/16/2014   Leukopenia 03/10/2014   Dizziness 02/10/2011   SINOATRIAL NODE DYSFUNCTION 08/06/2008   ATRIAL SEPTAL DEFECT 06/29/2007   Hypothyroidism 03/21/2007   Essential hypertension 03/21/2007   Perennial allergic rhinitis 03/21/2007   GERD-- dysphagia 03/21/2007   CONSTIPATION 03/21/2007   IRRITABLE BOWEL SYNDROME 03/21/2007   RA (rheumatoid arthritis) ([place]) 03/21/2007  LOW BACK PAIN 03/21/2007   FIBROMYALGIA 03/21/2007    Past Medical History:  Diagnosis Date   Allergic rhinitis, cause unspecified    Anemia    Asthma    Atrial septal defect    s/p repair;   Echo 08/07/10: EF 55-60%; mild MR; atrial septal aneurysm; no residual ASD   Chest pain, unspecified     cardiac cath 07/31/10: normal coronaries; vigorous LVF   Esophageal reflux    Fibromyalgia    Headache(784.0)    Hematuria, unspecified    Hypertension    no meds    Hypothyroidism    Irritable bowel syndrome    Low back pain    Rheumatoid arthritis(714.0)    Dr Herold Harms   Scoliosis    Sinoatrial node dysfunction (HCC)    eval. for chronotropic competence completed in past    Family History  Problem Relation Age of Onset   Hypertension Mother    Diabetes Mother    Kidney failure Mother    Heart disease Maternal Grandmother    Stomach cancer Maternal Grandmother 67   Rheum arthritis Maternal Grandmother    Glaucoma Father    Breast cancer Other        aunt   Diabetes Other        GM   Hypertension Sister    Thyroid disease Sister    Hypertension Sister    Thyroid disease Sister    Asthma Son    Eczema Son    Sinusitis Daughter    Colon cancer Neg Hx    Allergic rhinitis Neg Hx    Angioedema Neg Hx    Immunodeficiency Neg Hx    Past Surgical History:  Procedure Laterality Date   ASD REPAIR  1985   TONSILLECTOMY AND ADENOIDECTOMY     TUBAL LIGATION     Social History   Social History Narrative   Household-- lives by herself   Immunization History  Administered Date(s) Administered   Influenza Split 03/17/2011, 03/17/2012   Influenza Whole 04/05/2009, 02/21/2010   Influenza, Seasonal, Injecte, Preservative Fre 03/11/2021   Influenza,inj,Quad PF,6+ Mos 03/21/2013, 03/09/2014, 05/24/2015, 01/27/2016   Influenza-Unspecified 07/30/2020   PFIZER(Purple Top)SARS-COV-2 Vaccination 12/16/2019, 01/16/2020   Pneumococcal Conjugate-13 01/27/2016   Pneumococcal Polysaccharide-23 03/08/2015   Td 07/23/2010   Tdap 05/28/2020     Objective: Vital Signs: BP (!) 159/94 (BP Location: Left Arm, Patient Position: Sitting, Cuff Size: Normal)   Pulse (!) 45   Ht 5' 8.5" (1.74 m)   Wt 133 lb 12.8 oz (60.7 kg)   LMP 05/01/2016   BMI 20.05 kg/m    Physical  Exam Vitals and nursing note reviewed.  Constitutional:      Appearance: She is well-developed.  HENT:     Head: Normocephalic and atraumatic.  Eyes:     Comments: Right eye: diffuse erythema. No drainage or discharge.   Pulmonary:     Effort: Pulmonary effort is normal.  Abdominal:     Palpations: Abdomen is soft.  Musculoskeletal:     Cervical back: Normal range of motion.  Skin:    General: Skin is warm and dry.     Capillary Refill: Capillary refill takes less than 2 seconds.  Neurological:     Mental Status: She is alert and oriented to person, place, and time.  Psychiatric:        Behavior: Behavior normal.     Musculoskeletal Exam: C-spine has good range of motion with no discomfort at this time.  Thoracic and  lumbar spine have good range of motion.  Shoulder joints, elbow joints, wrist joints, MCPs, PIPs have good range of motion with no synovitis.  She has tenderness and inflammation of the DIP joint of the right fifth finger.  PIP and DIP thickening consistent with osteoarthritis of both hands.  Complete fist formation bilaterally.  Hip joints have good range of motion with no groin pain.  Knee joints have good range of motion with no warmth or effusion.  Ankle joints have good range of motion with no tenderness or joint swelling.  CDAI Exam: CDAI Score: 0.4  Patient Global: 2 mm; Provider Global: 2 mm Swollen: 1 ; Tender: 1  Joint Exam 04/30/2021      Right  Left  DIP 5  Swollen Tender      There is currently no information documented on the homunculus. Go to the Rheumatology activity and complete the homunculus joint exam.  Investigation: No additional findings.  Imaging: No results found.  Recent Labs: Lab Results  Component Value Date   WBC 4.0 04/08/2021   HGB 12.3 04/08/2021   PLT 140 (L) 04/08/2021   NA 144 04/08/2021   K 4.5 04/08/2021   CL 104 04/08/2021   CO2 27 04/08/2021   GLUCOSE 80 04/08/2021   BUN 15 04/08/2021   CREATININE 0.84  04/08/2021   BILITOT 0.3 04/08/2021   ALKPHOS 74 04/08/2021   AST 19 04/08/2021   ALT 12 04/08/2021   PROT 7.9 04/08/2021   ALBUMIN 4.5 04/08/2021   CALCIUM 10.1 04/08/2021   GFRAA 89 02/15/2020   QFTBGOLDPLUS NEGATIVE 01/31/2021    Speciality Comments: Dexa- 05/27/18 Osteopenia T-Score -1.7 BMD 0.963  Prior therapy includes: Plaquenil (inadequate response) , Enbrel (injection site reaction) and methotrexate (neutropenia so on low dose).  Procedures:  No procedures performed Allergies: Etanercept, Methotrexate derivatives, and Pregabalin   Assessment / Plan:     Visit Diagnoses: Rheumatoid arthritis involving multiple sites with positive rheumatoid factor ([place]): +Anti-CCP: She has been experiencing intermittent arthralgias and joint stiffness but has not noticed any inflammation.  On examination today she had tenderness and inflammation of the DIP joint of the right fifth finger.  She did not have any other joint tenderness or synovitis on examination.  She is currently on Cimzia 400 mg subcutaneous injections every 4 weeks.  She initiated therapy on 03/17/2021 and had her second injection on 04/14/2021.  She has been tolerating Cimzia without any side effects or injection site reactions. She presented today with a questionable iritis flare in her right eye.  She had diffuse erythema in the right eye and has been experiencing pain since yesterday.  She has an appointment with Dr. Sabra Heck today for further evaluation. She would benefit from combination therapy with methotrexate but she had to discontinue methotrexate in the past due to neutropenia.  She was previously on Humira but discontinued due to difficulty with self injections.  In the future if she continues to have recurrent iritis flares she may need to try Remicade and low-dose methotrexate.  In the meantime she will remain on Cimzia and we will give it more time to see the true efficacy.  We will also welcome further recommendations  from Dr. Sabra Heck.  She was advised to have Dr. Sabra Heck call our office or forward his office visit note from today for [place] to review.  She plans on returning to the office on 05/14/2021 for her next Cimzia injection. We will continue to follow her lab work and symptoms closely.  High risk medication use - Currently on in-office Cimzia.  First injection administered on 03/17/2021 followed by a second injection on 04/14/2021.  No injection site reactions or side effects.   Previous therapy: Plaquenil (inadequate response) , Enbrel (injection site reaction) and methotrexate (neutropenia-even on low dose). D/c humira-difficulty with self injections.  CBC and CMP updated on 04/08/2021.  White blood cell count was within normal limits.  No anemia noted.  Platelet count was 140 -stable.  CBC will be updated today prior to her next Cimzia injection.  We will continue to monitor lab work closely.  TB Gold negative on 01/31/2021. She has not had any infections since starting on Cimzia.  Discussed the importance of postponing Cimzia injections if she develops signs or symptoms of an infection and to resume once the infection has completely cleared. - Plan: CBC with Differential/Platelet, CANCELED: COMPLETE METABOLIC PANEL WITH GFR  Iritis - Dr. Teodoro Spray, recurrent iritis: She presented today with pain and diffuse erythema in the right eye.  She was evaluated by Dr. Sabra Heck on 04/25/2021 at which time she was told that she had irritation from allergic conjunctivitis.  Starting yesterday she was experiencing pain in her right eye, so she is scheduled for an appointment today with Dr. Sabra Heck for further evaluation. She has been using prednisone drops in the left eye for ongoing inflammation.  Her disease remains uncontrolled despite having 2 doses of Cimzia.  She would benefit from methotrexate in combination with Cimzia but she could not take it in the past due to neutropenia even on a low-dose.  She also tried Humira in  the past but had difficulty with self injections.  In the future she may need to switch to Remicade if Cimzia does not manage her symptoms. She was advised to have Dr. Sabra Heck call our office or forward his office visit note from today for further guidance and recommendations.  Other neutropenia (HCC) -WBC count WNL-4.0 and absolute neutrophils WNL on 04/08/21.  CBC with diff will be updated today.  We will continue to monitor lab work closely while on cimzia.   Plan: CBC with Differential/Platelet  Primary osteoarthritis of both hands: She continues to take tumeric, tart cherry, and ginger.   Primary osteoarthritis of both feet: She is not experiencing any discomfort in her feet at this time.  She has good ROM of both ankle joints with no tenderness or joint swelling.   DDD (degenerative disc disease), cervical: Her ROM has improved significantly since completing PT.    Trochanteric bursitis, right hip - Resolved.  Chronic midline low back pain without sciatica - X-rays obtained on 02/16/2020 were consistent with mild scoliosis and facet joint arthropathy.  Osteopenia of multiple sites - DEXA updated on 10/28/20: AP spine BMD 0.921 with T-score -1.1.  Statistically significant decrease in BMD of AP spine.     Other medical conditions are listed as follows:  History of scoliosis  History of hypothyroidism  History of IBS  History of migraine  History of bradycardia/ sinoatrial node dysfunction   History of hypertension  History of gastroesophageal reflux (GERD)  Orders: Orders Placed This Encounter  Procedures   CBC with Differential/Platelet   No orders of the defined types were placed in this encounter.     Follow-Up Instructions: Return in about 3 months (around 07/29/2021) for Rheumatoid arthritis, Iritis.   Ofilia Neas, PA-C  Note - This record has been created using Dragon software.  Chart creation errors have been sought, but may  not always  have been located.  Such creation errors do not reflect on  the standard of medical care.

## 2021-04-25 ENCOUNTER — Encounter: Payer: Self-pay | Admitting: General Practice

## 2021-04-30 ENCOUNTER — Ambulatory Visit (INDEPENDENT_AMBULATORY_CARE_PROVIDER_SITE_OTHER): Payer: 59 | Admitting: Physician Assistant

## 2021-04-30 ENCOUNTER — Other Ambulatory Visit: Payer: Self-pay

## 2021-04-30 ENCOUNTER — Encounter: Payer: Self-pay | Admitting: Physician Assistant

## 2021-04-30 VITALS — BP 159/94 | HR 45 | Ht 68.5 in | Wt 133.8 lb

## 2021-04-30 DIAGNOSIS — M7061 Trochanteric bursitis, right hip: Secondary | ICD-10-CM

## 2021-04-30 DIAGNOSIS — M19041 Primary osteoarthritis, right hand: Secondary | ICD-10-CM

## 2021-04-30 DIAGNOSIS — Z79899 Other long term (current) drug therapy: Secondary | ICD-10-CM | POA: Diagnosis not present

## 2021-04-30 DIAGNOSIS — M5030 Other cervical disc degeneration, unspecified cervical region: Secondary | ICD-10-CM

## 2021-04-30 DIAGNOSIS — D708 Other neutropenia: Secondary | ICD-10-CM

## 2021-04-30 DIAGNOSIS — H209 Unspecified iridocyclitis: Secondary | ICD-10-CM

## 2021-04-30 DIAGNOSIS — Z8669 Personal history of other diseases of the nervous system and sense organs: Secondary | ICD-10-CM

## 2021-04-30 DIAGNOSIS — Z8719 Personal history of other diseases of the digestive system: Secondary | ICD-10-CM

## 2021-04-30 DIAGNOSIS — M19071 Primary osteoarthritis, right ankle and foot: Secondary | ICD-10-CM

## 2021-04-30 DIAGNOSIS — Z8739 Personal history of other diseases of the musculoskeletal system and connective tissue: Secondary | ICD-10-CM

## 2021-04-30 DIAGNOSIS — Z8679 Personal history of other diseases of the circulatory system: Secondary | ICD-10-CM

## 2021-04-30 DIAGNOSIS — G8929 Other chronic pain: Secondary | ICD-10-CM

## 2021-04-30 DIAGNOSIS — M0579 Rheumatoid arthritis with rheumatoid factor of multiple sites without organ or systems involvement: Secondary | ICD-10-CM | POA: Diagnosis not present

## 2021-04-30 DIAGNOSIS — M8589 Other specified disorders of bone density and structure, multiple sites: Secondary | ICD-10-CM

## 2021-04-30 DIAGNOSIS — Z8639 Personal history of other endocrine, nutritional and metabolic disease: Secondary | ICD-10-CM

## 2021-04-30 DIAGNOSIS — M19042 Primary osteoarthritis, left hand: Secondary | ICD-10-CM

## 2021-04-30 DIAGNOSIS — M19072 Primary osteoarthritis, left ankle and foot: Secondary | ICD-10-CM

## 2021-04-30 DIAGNOSIS — Z87898 Personal history of other specified conditions: Secondary | ICD-10-CM

## 2021-04-30 DIAGNOSIS — M545 Low back pain, unspecified: Secondary | ICD-10-CM

## 2021-04-30 LAB — CBC WITH DIFFERENTIAL/PLATELET
Absolute Monocytes: 537 cells/uL (ref 200–950)
Basophils Absolute: 21 cells/uL (ref 0–200)
Basophils Relative: 0.5 %
Eosinophils Absolute: 41 cells/uL (ref 15–500)
Eosinophils Relative: 1 %
HCT: 38.6 % (ref 35.0–45.0)
Hemoglobin: 12.4 g/dL (ref 11.7–15.5)
Lymphs Abs: 1661 cells/uL (ref 850–3900)
MCH: 28.7 pg (ref 27.0–33.0)
MCHC: 32.1 g/dL (ref 32.0–36.0)
MCV: 89.4 fL (ref 80.0–100.0)
MPV: 12.3 fL (ref 7.5–12.5)
Monocytes Relative: 13.1 %
Neutro Abs: 1841 cells/uL (ref 1500–7800)
Neutrophils Relative %: 44.9 %
Platelets: 157 10*3/uL (ref 140–400)
RBC: 4.32 10*6/uL (ref 3.80–5.10)
RDW: 12 % (ref 11.0–15.0)
Total Lymphocyte: 40.5 %
WBC: 4.1 10*3/uL (ref 3.8–10.8)

## 2021-05-01 NOTE — Progress Notes (Signed)
CBC WNL.  Please call Dr. Ammie Ferrier office to obtain visit notes from yesterday for [place] to review.

## 2021-05-02 ENCOUNTER — Other Ambulatory Visit: Payer: Self-pay

## 2021-05-02 ENCOUNTER — Ambulatory Visit (INDEPENDENT_AMBULATORY_CARE_PROVIDER_SITE_OTHER): Payer: 59 | Admitting: Podiatry

## 2021-05-02 DIAGNOSIS — L603 Nail dystrophy: Secondary | ICD-10-CM

## 2021-05-06 NOTE — Progress Notes (Signed)
Subjective:  Patient ID: Brittany Hodges, female    DOB: Aug 03, 1965,  MRN: [id number]  No chief complaint on file.   55 y.o. female presents with the above complaint.  Patient presents with bilateral hallux discoloration.  Patient states that is not painful and she just wanted to get it evaluated.  She states that this has been present for quite some time.  She does not know the etiology to it.  She wanted to make sure is not a fungus.  She has not seen anyone else prior to seeing me.  She denies any other acute complaints.   Review of Systems: Negative except as noted in the HPI. Denies N/V/F/Ch.  Past Medical History:  Diagnosis Date   Allergic rhinitis, cause unspecified    Anemia    Asthma    Atrial septal defect    s/p repair;   Echo 08/07/10: EF 55-60%; mild MR; atrial septal aneurysm; no residual ASD   Chest pain, unspecified    cardiac cath 07/31/10: normal coronaries; vigorous LVF   Esophageal reflux    Fibromyalgia    Headache(784.0)    Hematuria, unspecified    Hypertension    no meds    Hypothyroidism    Irritable bowel syndrome    Low back pain    Rheumatoid arthritis(714.0)    Dr Herold Harms   Scoliosis    Sinoatrial node dysfunction (HCC)    eval. for chronotropic competence completed in past    Current Outpatient Medications:    albuterol (PROAIR HFA) 108 (90 Base) MCG/ACT inhaler, Inhale 2 puffs into the lungs every 6 (six) hours as needed. For shortness of breath and wheezing, Disp: 1 Inhaler, Rfl: 6   amLODipine (NORVASC) 2.5 MG tablet, Take 1 tablet (2.5 mg total) by mouth daily., Disp: 90 tablet, Rfl: 1   amoxicillin (AMOXIL) 500 MG capsule, Take 500 mg by mouth 3 (three) times daily. Prior to dental appointments, Disp: , Rfl:    Baclofen 5 MG TABS, Take 5-10 mg by mouth 3 (three) times daily as needed., Disp: 30 tablet, Rfl: 0   BLACK CURRANT SEED OIL PO, Take by mouth daily., Disp: , Rfl:    Carboxymethylcellulose Sodium (EYE DROPS OP), Apply to eye  daily., Disp: , Rfl:    Certolizumab Pegol (CIMZIA) 2 X 200 MG KIT, Inject into the skin every 28 (twenty-eight) days., Disp: , Rfl:    diclofenac (VOLTAREN) 75 MG EC tablet, Take 1 tablet (75 mg total) by mouth 2 (two) times daily as needed., Disp: 60 tablet, Rfl: 2   fluticasone (FLONASE) 50 MCG/ACT nasal spray, Place 2 sprays into both nostrils daily as needed., Disp: 16 g, Rfl: 6   Ginger, Zingiber officinalis, (GINGER ROOT PO), Take by mouth., Disp: , Rfl:    HYDROcodone-acetaminophen (NORCO/VICODIN) 5-325 MG tablet, Take 1-2 tablets by mouth every 8 (eight) hours as needed., Disp: 15 tablet, Rfl: 0   meclizine (ANTIVERT) 12.5 MG tablet, Take 1 tablet (12.5 mg total) by mouth 3 (three) times daily as needed for dizziness., Disp: 30 tablet, Rfl: 0   Misc Natural Products (TART CHERRY ADVANCED PO), Take by mouth., Disp: , Rfl:    prednisoLONE acetate (PRED FORTE) 1 % ophthalmic suspension, Place 1 drop into both eyes daily., Disp: , Rfl:    Spacer/Aero-Holding Chambers (AEROCHAMBER MV) inhaler, Use as instructed, Disp: 1 each, Rfl: 0   TURMERIC PO, Take by mouth., Disp: , Rfl:   Social History   Tobacco Use  Smoking Status Never  Smokeless Tobacco Never  Tobacco Comments   never used tobacco    Allergies  Allergen Reactions   Etanercept    Methotrexate Derivatives     Caused her WBC to drop   Pregabalin    Objective:  There were no vitals filed for this visit. There is no height or weight on file to calculate BMI. Constitutional Well developed. Well nourished.  Vascular Dorsalis pedis pulses palpable bilaterally. Posterior tibial pulses palpable bilaterally. Capillary refill normal to all digits.  No cyanosis or clubbing noted. Pedal hair growth normal.  Neurologic Normal speech. Oriented to person, place, and time. Epicritic sensation to light touch grossly present bilaterally.  Dermatologic Nails bilateral hallux with discoloration noted to the hallux.  No signs of  melanoma noted.  No longitudinal melanonychia noted.  There is signs of microtrauma to the nails.  This is likely the cause of the discoloration. Skin within normal limits  Orthopedic: Normal joint ROM without pain or crepitus bilaterally. No visible deformities. No bony tenderness.   Radiographs: None Assessment:   1. Onychodystrophy    Plan:  Patient was evaluated and treated and all questions answered.  Bilateral hallux onychodystrophy with history of microtrauma -All questions and concerns were discussed in extensive detail. -At this time I discussed with the patient that I do not see any signs of nail fungus however I do see signs of microtrauma likely from shoes constantly hitting the top of the toe.  At this time I discussed with the patient that there shoe gear modification.  Unfortunately there is not much to do surgically.  I discussed with her that microtrauma can be either permanent or self resolved.  She states understanding. -If any foot and ankle issues arise which have asked her to come see me  No follow-ups on file.

## 2021-05-14 ENCOUNTER — Other Ambulatory Visit: Payer: Self-pay

## 2021-05-14 ENCOUNTER — Ambulatory Visit (INDEPENDENT_AMBULATORY_CARE_PROVIDER_SITE_OTHER): Payer: 59 | Admitting: *Deleted

## 2021-05-14 VITALS — BP 163/82 | HR 80

## 2021-05-14 DIAGNOSIS — M0579 Rheumatoid arthritis with rheumatoid factor of multiple sites without organ or systems involvement: Secondary | ICD-10-CM

## 2021-05-14 DIAGNOSIS — Z79899 Other long term (current) drug therapy: Secondary | ICD-10-CM

## 2021-05-14 MED ORDER — CERTOLIZUMAB PEGOL 2 X 200 MG ~~LOC~~ KIT
400.0000 mg | PACK | Freq: Once | SUBCUTANEOUS | Status: AC
  Administered 2021-05-14: 400 mg via SUBCUTANEOUS

## 2021-05-14 NOTE — Progress Notes (Signed)
Subjective:   Patient presents to clinic today to receive monthly dose of Cimzia.  Patient running a fever or have signs/symptoms of infection? No  Patient currently on antibiotics for the treatment of infection? No  Patient have any upcoming invasive procedures/surgeries? No  Objective: CMP     Component Value Date/Time   NA 144 04/08/2021 1216   K 4.5 04/08/2021 1216   CL 104 04/08/2021 1216   CO2 27 04/08/2021 1216   GLUCOSE 80 04/08/2021 1216   GLUCOSE 86 03/06/2021 1452   BUN 15 04/08/2021 1216   CREATININE 0.84 04/08/2021 1216   CREATININE 1.19 (H) 02/18/2021 1452   CREATININE 0.85 01/31/2021 0903   CALCIUM 10.1 04/08/2021 1216   PROT 7.9 04/08/2021 1216   ALBUMIN 4.5 04/08/2021 1216   AST 19 04/08/2021 1216   AST 21 02/18/2021 1452   ALT 12 04/08/2021 1216   ALT 13 02/18/2021 1452   ALKPHOS 74 04/08/2021 1216   BILITOT 0.3 04/08/2021 1216   BILITOT 0.4 02/18/2021 1452   GFRNONAA 53 (L) 02/18/2021 1452   GFRNONAA 77 02/15/2020 1023   GFRAA 89 02/15/2020 1023    CBC    Component Value Date/Time   WBC 4.1 04/30/2021 1457   RBC 4.32 04/30/2021 1457   HGB 12.4 04/30/2021 1457   HGB 12.3 04/08/2021 1216   HGB 11.2 (L) 07/26/2013 0802   HCT 38.6 04/30/2021 1457   HCT 39.3 04/08/2021 1216   HCT 35.5 07/26/2013 0802   PLT 157 04/30/2021 1457   PLT 140 (L) 04/08/2021 1216   MCV 89.4 04/30/2021 1457   MCV 90 04/08/2021 1216   MCV 86 07/26/2013 0802   MCH 28.7 04/30/2021 1457   MCHC 32.1 04/30/2021 1457   RDW 12.0 04/30/2021 1457   RDW 12.0 04/08/2021 1216   RDW 13.6 07/26/2013 0802   LYMPHSABS 1,661 04/30/2021 1457   LYMPHSABS 2.0 04/08/2021 1216   LYMPHSABS 0.9 07/26/2013 0802   MONOABS 0.5 02/18/2021 1452   EOSABS 41 04/30/2021 1457   EOSABS 0.0 04/08/2021 1216   EOSABS 0.0 07/26/2013 0802   BASOSABS 21 04/30/2021 1457   BASOSABS 0.0 04/08/2021 1216   BASOSABS 0.0 07/26/2013 0802    Baseline Immunosuppressant Therapy Labs TB GOLD Quantiferon TB  Gold Latest Ref Rng & Units 01/31/2021  Quantiferon TB Gold Plus NEGATIVE NEGATIVE   Hepatitis Panel Hepatitis Latest Ref Rng & Units 06/23/2018  Hep B Surface Ag NON-REACTI NON-REACTIVE  Hep B IgM NON-REACTI NON-REACTIVE  Hep C Ab NON-REACTI NON-REACTIVE  Hep C Ab NON-REACTI NON-REACTIVE  Hep A IgM NON-REACTI NON-REACTIVE   HIV Lab Results  Component Value Date   HIV NONREACTIVE 12/21/2014   Immunoglobulins Immunoglobulin Electrophoresis Latest Ref Rng & Units 06/23/2018  IgA  47 - 310 mg/dL 400(H)  IgG 600 - 1,640 mg/dL 2,196(H)  IgM 50 - 300 mg/dL 118   SPEP Serum Protein Electrophoresis Latest Ref Rng & Units 04/08/2021  Total Protein 6.0 - 8.5 g/dL 7.9  Albumin 55.8 - 66.1 % -  Alpha-1 2.9 - 4.9 % -  Alpha-2 7.1 - 11.8 % -  Beta Globulin 4.7 - 7.2 % -  Beta 2 3.2 - 6.5 % -  Gamma Globulin 11.1 - 18.8 % -   G6PD No results found for: G6PDH TPMT No results found for: TPMT   Chest x-ray: 09/16/2020 Stable exam without acute or active cardiopulmonary disease  Assessment/Plan:   Administrations This Visit     Certolizumab Pegol KIT 400 mg  Admin Date 05/14/2021 Action Given Dose 400 mg Route Subcutaneous Administered By Carole Binning, LPN             Patient tolerated injection well.   Appointment for next injection scheduled for 06/12/2021.  Patient due for labs in February 2023.  Patient is to call and reschedule appointment if running a fever with signs/symptoms of infection, on antibiotics for active infection or has an upcoming invasive procedure.  All questions encouraged and answered.  Instructed patient to call with any further questions or concerns.

## 2021-06-12 ENCOUNTER — Ambulatory Visit: Payer: 59

## 2021-06-20 ENCOUNTER — Encounter: Payer: Self-pay | Admitting: Internal Medicine

## 2021-06-20 ENCOUNTER — Ambulatory Visit (INDEPENDENT_AMBULATORY_CARE_PROVIDER_SITE_OTHER): Payer: Managed Care, Other (non HMO) | Admitting: Internal Medicine

## 2021-06-20 VITALS — BP 126/76 | HR 47 | Temp 98.4°F | Resp 16 | Ht 68.5 in | Wt 129.2 lb

## 2021-06-20 DIAGNOSIS — E039 Hypothyroidism, unspecified: Secondary | ICD-10-CM

## 2021-06-20 DIAGNOSIS — I10 Essential (primary) hypertension: Secondary | ICD-10-CM

## 2021-06-20 DIAGNOSIS — Z0000 Encounter for general adult medical examination without abnormal findings: Secondary | ICD-10-CM

## 2021-06-20 DIAGNOSIS — R739 Hyperglycemia, unspecified: Secondary | ICD-10-CM

## 2021-06-20 MED ORDER — AZITHROMYCIN 250 MG PO TABS: ORAL_TABLET | ORAL | 0 refills | Status: DC

## 2021-06-20 NOTE — Progress Notes (Signed)
Subjective:    Patient ID: Brittany Hodges, female    DOB: June 02, 1965, 56 y.o.   MRN: [id number]  DOS:  06/20/2021 Type of visit - description: CPX  Here for CPX 2 weeks ago went to a local urgent care, Dx sinusitis.  At the time she had fever, ear ache, cough. Rx Flonase, amoxicillin, feels some better but is still has a  persistent cough. No further fever.  Wt Readings from Last 3 Encounters:  06/20/21 129 lb 4 oz (58.6 kg)  04/30/21 133 lb 12.8 oz (60.7 kg)  03/06/21 131 lb (59.4 kg)    Review of Systems  Other than above, a 14 point review of systems is negative       Past Medical History:  Diagnosis Date   Allergic rhinitis, cause unspecified    Anemia    Asthma    Atrial septal defect    s/p repair;   Echo 08/07/10: EF 55-60%; mild MR; atrial septal aneurysm; no residual ASD   Chest pain, unspecified    cardiac cath 07/31/10: normal coronaries; vigorous LVF   Esophageal reflux    Fibromyalgia    Headache(784.0)    Hematuria, unspecified    Hypertension    no meds    Hypothyroidism    Irritable bowel syndrome    Low back pain    Rheumatoid arthritis(714.0)    Dr Herold Harms   Scoliosis    Sinoatrial node dysfunction (HCC)    eval. for chronotropic competence completed in past    Past Surgical History:  Procedure Laterality Date   ASD REPAIR  1985   TONSILLECTOMY AND ADENOIDECTOMY     TUBAL LIGATION     Social History   Socioeconomic History   Marital status: Single    Spouse name: Not on file   Number of children: 3   Years of education: Not on file   Highest education level: Not on file  Occupational History   Occupation: [employer] access  Tobacco Use   Smoking status: Never   Smokeless tobacco: Never   Tobacco comments:    never used tobacco  Vaping Use   Vaping Use: Never used  Substance and Sexual Activity   Alcohol use: No   Drug use: No   Sexual activity: Not Currently    Birth control/protection: Surgical  Other Topics Concern    Not on file  Social History Narrative   Household-- lives by herself   Social Determinants of Health   Financial Resource Strain: Not on file  Food Insecurity: Not on file  Transportation Needs: Not on file  Physical Activity: Not on file  Stress: Not on file  Social Connections: Not on file  Intimate Partner Violence: Not on file    Current Outpatient Medications  Medication Instructions   albuterol (PROAIR HFA) 108 (90 Base) MCG/ACT inhaler 2 puffs, Inhalation, Every 6 hours PRN, For shortness of breath and wheezing   amLODipine (NORVASC) 2.5 mg, Oral, Daily   amoxicillin (AMOXIL) 500 mg, 3 times daily   azithromycin (ZITHROMAX) 250 MG tablet Take 2 tablets by mouth first day, then 1 tablet for 4 additional days   BLACK CURRANT SEED OIL PO Oral, Daily   Carboxymethylcellulose Sodium (EYE DROPS OP) Ophthalmic, Daily   Certolizumab Pegol (CIMZIA) 2 X 200 MG KIT Subcutaneous, Every 28 days   diclofenac (VOLTAREN) 75 mg, Oral, 2 times daily PRN   fluticasone (FLONASE) 50 MCG/ACT nasal spray 2 sprays, Each Nare, Daily PRN   Ginger, Zingiber  officinalis, (GINGER ROOT PO) Oral   HYDROcodone-acetaminophen (NORCO/VICODIN) 5-325 MG tablet 1-2 tablets, Oral, Every 8 hours PRN   meclizine (ANTIVERT) 12.5 mg, Oral, 3 times daily PRN   Misc Natural Products (TART CHERRY ADVANCED PO) Oral   prednisoLONE acetate (PRED FORTE) 1 % ophthalmic suspension 1 drop, Both Eyes, Daily   Spacer/Aero-Holding Chambers (AEROCHAMBER MV) inhaler Use as instructed   TURMERIC PO Oral       Objective:   Physical Exam BP 126/76 (BP Location: Left Arm, Patient Position: Sitting, Cuff Size: Small)    Pulse (!) 47    Temp 98.4 F (36.9 C) (Oral)    Resp 16    Ht 5' 8.5" (1.74 m)    Wt 129 lb 4 oz (58.6 kg)    LMP 05/01/2016    SpO2 97%    BMI 19.37 kg/m  General: Well developed, NAD, BMI noted Neck: No  thyromegaly  HEENT:  Normocephalic . Face symmetric, atraumatic.  Conjunctiva R eye injected. Has a  lump under the R ear, seems attached to the parotid gland (already evaluated by ENT)  Lungs:  few rhonchi with cough. Normal respiratory effort, no intercostal retractions, no accessory muscle use. Heart: RRR, soft systolic murmur.  Abdomen:  Not distended, soft, non-tender. No rebound or rigidity.   Lower extremities: no pretibial edema bilaterally  Skin: Exposed areas without rash. Not pale. Not jaundice Neurologic:  alert & oriented X3.  Speech normal, gait appropriate for age and unassisted Strength symmetric and appropriate for age.  Psych: Cognition and judgment appear intact.  Cooperative with normal attention span and concentration.  Behavior appropriate. No anxious or depressed appearing.     Assessment      Assessment   Prediabetes: A1c 6.0 (01-2017) HTN H/o Hypothyroidism (took meds at some point) GERD- dysphagia -Dr. Norville Haggard EGD from 04/02/2014 (Dr Delrae Alfred): Narrowing of proximal esophagus, question of extrinsic extrinsic compression, EUS showing no intrinsic esophageal mass but the spine was seen in close proximity  to the esophagus probably resulting in extrinsic compression. The endoscopist recommend repeat CT neck and chest in 3 months.saw ENT 04-2015 for dysphagia,had a scope,  rx PPI Asthma, uses alb rarely  CV: --Atrial septal defect: Status post repair, no residual ASD: Needs ABX prophylaxis preprocedures --Sinoatrial  Dysfunction, sees Dr Caryl Comes   --CP: cath 2012, normal coronaries MSK: --Rheumatoid arthritis,   Dr. Herold Harms + rheumatoid factor +CCP,   h/o  low WBC with Methotrexate,   injection site reaction to Enbrel  , self d/c Humira ~ 12/2018 --Osteopenia: T score -1.7) 05/27/2018) IBS (Miralax prn constipation)   Admission: syncope 04-2016 (unlikely to be arrhythmia related per cards ) History of fibromyalgia   PLAN:  Here for CPX Prediabetes: Check A1c HTN:On amlodipine.  Seems well controlled. Thyroid disease, history of: Checking  labs. Cardiovascular: Due to see cardiology, encouraged to call. Rheumatoid arthritis: Currently having problems with APS scleritis, under the care of rheumatology, on Cimzia. Lump, R parotid gland: See physical exam, area on self-examination is stable, that has been already assessed by ENT. RTC 1 year   This visit occurred during the SARS-CoV-2 public health emergency.  Safety protocols were in place, including screening questions prior to the visit, additional usage of staff PPE, and extensive cleaning of exam room while observing appropriate contact time as indicated for disinfecting solutions.

## 2021-06-20 NOTE — Patient Instructions (Addendum)
For bronchitis: Zithromax Mucinex DM as needed Continue Flonase Call if not gradually better.  Vaccines I recommend: COVID booster.  Recommend to see your cardiologist, please let me know if I can help.  GO TO THE LAB : Get the blood work     [place], [place] back for  a physical in 1 year       "Living will", "[place] of attorney": Advanced care planning  (If you already have a living will or healthcare power of attorney, please bring the copy to be scanned in your chart.)  Advance care planning is a process that supports adults in  understanding and sharing their preferences regarding future medical care.   The patient's preferences are recorded in documents called Advance Directives.    Advanced directives are completed (and can be modified at any time) while the patient is in full mental capacity.   The documentation should be available at all times to the patient, the family and the healthcare providers.  Bring in a copy to be scanned in your chart is an excellent idea and is recommended   This legal documents direct treatment decision making and/or appoint a surrogate to make the decision if the patient is not capable to do so.    Advance directives can be documented in many types of formats,  documents have names such as:  Lliving will  Durable power of attorney for healthcare (healthcare proxy or healthcare power of attorney)  Combined directives  Physician orders for life-sustaining treatment    More information at:  [email or web address]

## 2021-06-22 ENCOUNTER — Encounter: Payer: Self-pay | Admitting: Internal Medicine

## 2021-06-22 LAB — HEMOGLOBIN A1C
Hgb A1c MFr Bld: 5.9 % of total Hgb — ABNORMAL HIGH (ref <5.7)
Mean Plasma Glucose: 123 mg/dL
eAG (mmol/L): 6.8 mmol/L

## 2021-06-22 LAB — LIPID PANEL
Cholesterol: 130 mg/dL (ref <200)
HDL: 55 mg/dL (ref >=50)
LDL Cholesterol (Calc): 62 mg/dL (calc)
Non-HDL Cholesterol (Calc): 75 mg/dL (calc) (ref <130)
Total CHOL/HDL Ratio: 2.4 (calc) (ref <5.0)
Triglycerides: 53 mg/dL (ref <150)

## 2021-06-22 LAB — T4, FREE: Free T4: 1 ng/dL (ref 0.8–1.8)

## 2021-06-22 LAB — VITAMIN D 25 HYDROXY (VIT D DEFICIENCY, FRACTURES): Vit D, 25-Hydroxy: 21 ng/mL — ABNORMAL LOW (ref 30–100)

## 2021-06-22 LAB — MAGNESIUM: Magnesium: 1.9 mg/dL (ref 1.5–2.5)

## 2021-06-22 LAB — TSH: TSH: 3.47 mIU/L (ref 0.40–4.50)

## 2021-06-22 LAB — ZINC: Zinc: 61 ug/dL (ref 60–130)

## 2021-06-22 NOTE — Assessment & Plan Note (Signed)
Here for CPX Prediabetes: Check A1c HTN:On amlodipine.  Seems well controlled. Thyroid disease, history of: Checking labs. Cardiovascular: Due to see cardiology, encouraged to call. Rheumatoid arthritis: Currently having problems with APS scleritis, under the care of rheumatology, on Cimzia. Lump, R parotid gland: See physical exam, area on self-examination is stable, that has been already assessed by ENT. RTC 1 year

## 2021-06-22 NOTE — Assessment & Plan Note (Signed)
-   Tdap 2022  - Pneumovax   2016;  prevnar 01-2016 - shingrix d/w pt-declined  -COVID VAX x 2, rec booster  - had a  flu shot -Female care: per  Gynecology,  [facility] 11-2020 -CCS: Cscope  05/07/2011 negative,Cscope 09/2019 next per GI  - Diet and exercise:   - labs:  FLP , TSH, T4 , a1c vit D; also request magnesium and zinc check.

## 2021-06-23 MED ORDER — VITAMIN D (ERGOCALCIFEROL) 1.25 MG (50000 UNIT) PO CAPS: 50000.0000 [IU] | ORAL_CAPSULE | ORAL | 0 refills | Status: DC

## 2021-06-23 NOTE — Addendum Note (Signed)
Addended byDamita Dunnings D on: 06/23/2021 03:20 PM   Modules accepted: Orders

## 2021-07-25 ENCOUNTER — Ambulatory Visit: Payer: Managed Care, Other (non HMO) | Admitting: Physician Assistant

## 2021-07-25 ENCOUNTER — Other Ambulatory Visit: Payer: Self-pay

## 2021-07-25 ENCOUNTER — Encounter: Payer: Self-pay | Admitting: Physician Assistant

## 2021-07-25 VITALS — BP 120/60 | HR 48 | Ht 68.5 in | Wt 129.6 lb

## 2021-07-25 DIAGNOSIS — R001 Bradycardia, unspecified: Secondary | ICD-10-CM

## 2021-07-25 DIAGNOSIS — Z8774 Personal history of (corrected) congenital malformations of heart and circulatory system: Secondary | ICD-10-CM

## 2021-07-25 NOTE — Patient Instructions (Signed)
Medication Instructions:  ? ? ?Your physician recommends that you continue on your current medications as directed. Please refer to the Current Medication list given to you today. ? ?*If you need a refill on your cardiac medications before your next appointment, please call your pharmacy* ? ? ?Lab Work:  [place] ? ? ?If you have labs (blood work) drawn today and your tests are completely normal, you will receive your results only by: ?MyChart Message (if you have MyChart) OR ?A paper copy in the mail ?If you have any lab test that is abnormal or we need to change your treatment, we will call you to review the results. ? ? ?Testing/Procedures:  Non-Cardiac CT scanning, (CAT scanning), is a noninvasive, special x-ray that produces cross-sectional images of the body using x-rays and a computer. CT scans help physicians diagnose and treat medical conditions. For some CT exams, a contrast material is used to enhance visibility in the area of the body being studied. CT scans provide greater clarity and reveal more details than regular x-ray exams. ? ? ? ? ?Follow-Up: ?At [facility], you and your health needs are our priority.  As part of our continuing mission to provide you with exceptional heart care, we have created designated Provider Care Teams.  These Care Teams include your primary Cardiologist (physician) and Advanced Practice Providers (APPs -  Physician Assistants and Nurse Practitioners) who all work together to provide you with the care you need, when you need it. ? ?We recommend signing up for the patient portal called "MyChart".  Sign up information is provided on this After Visit Summary.  MyChart is used to connect with patients for Virtual Visits (Telemedicine).  Patients are able to view lab/test results, encounter notes, upcoming appointments, etc.  Non-urgent messages can be sent to your provider as well.   ?To learn more about what you can do with MyChart, go to  [email or web address].   ? ?Your next appointment:   ?1 year(s) ? ?The format for your next appointment:   ?In Person ? ?Provider:   ?Virl Axe, MD  ? ? ?Other Instructions ? ?

## 2021-07-25 NOTE — Progress Notes (Signed)
Cardiology Office Note Date:  07/25/2021  Patient ID:  Brittany Hodges, Brittany Hodges [birth year], MRN [id number] PCP:  Colon Branch, MD  Cardiologist:  Dr. Caryl Comes   Chief Complaint: overdue annual visit  History of Present Illness: Brittany Hodges is a 56 y.o. female with history of ASD repair with resultant junctional rhythm, SN dysfunction, hypothyroidism, IBS, fibromyalgia, HTN, asthma, RA   She had a hospital stay at [facility], discharged 05/07/16.  She reports standing in her kitchen talking with her son, feeling like something was wrong and fainted.  She reports being told she was told a few seconds, maybe about 45 seconds, her son caught her and did not fall to the grounds or suffer trauma.  She had no CP or sensation of palpitations, no SOB.   She was seen by neurology. MRI and EEG the brain was unremarkable. Seen by cardiology given her bradycardia however her bradycardia has been worked up in the past his runs low at baseline. Discussed with Dr. Minna Merritts from [facility] cardiology who doesn't feel bradycardia is related to her presentation and he has advised follow-up as outpatient,  She comes today to be seen for Dr. Caryl Comes, last seen by him via tele-health June 2020.  She was working from home, caring for her aging mother who had dementia and on HD.  Bothered by her arthritis, no symptoms of bradycardia, and did not seem bothered by her bradycardia though felt EKG and GXT to be done is 3mowas indicated.  EKG 01/2019 with SB 52bpm, normal intervals  EST 02/15/2019 with resting HR 47 and peak 155bpm, described as having excellent exertional capacity and without evidence of ischemia.  She saw her PMD 06/07/20 with some degree of dysphagia and ear ache dx w/parotitis, already referred to ENT and rx keflex for early cellulitis. (also mentioned recent passing of her Mom)  I saw her 06/12/20 She feels quite well. No CP, palpitations or cardiac awareness. She goes to the gym 5-6x/week, is a class with  stations including cardio and light weights and rports excellent exertional capacity, no symptoms No dizzy spells, near syncope or syncope. Recently had labs done with her PMD, reported thyroid, lipids all were good. Ear cleared up well (noted above) She reports that her mom died after having 4 recurrent strokes found with carotid disease apparently as the cause.  She worries about this and wonders if we can check this. SB 45bpm, no symptoms, planned to d/w Dr. KCaryl Comesand have her back in a few weeks Planned to screen carotids (looked OK)  Saw PMD 06/2021, stable  TODAY She had been a little less active for a while 2/2 her neck pain/scoliosis, but back at the gym now again for a while, treadmill, elliptical, stair machine, usually about 45 minutes or so with excellent exertional capacity.  Occasionally and randomly she has aching in her chest/back, but massage, stretching can change it, belching as well.  Suspects her RA  No SOB, DOE  No dizzy spells, lightheadedness, no near syncope or syncope   Past Medical History:  Diagnosis Date   Allergic rhinitis, cause unspecified    Anemia    Asthma    Atrial septal defect    s/p repair;   Echo 08/07/10: EF 55-60%; mild MR; atrial septal aneurysm; no residual ASD   Chest pain, unspecified    cardiac cath 07/31/10: normal coronaries; vigorous LVF   Esophageal reflux    Fibromyalgia    Headache(784.0)    Hematuria, unspecified  Hypertension    no meds    Hypothyroidism    Irritable bowel syndrome    Low back pain    Rheumatoid arthritis(714.0)    Dr Herold Harms   Scoliosis    Sinoatrial node dysfunction (HCC)    eval. for chronotropic competence completed in past    Past Surgical History:  Procedure Laterality Date   ASD REPAIR  1985   TONSILLECTOMY AND ADENOIDECTOMY     TUBAL LIGATION      Current Outpatient Medications  Medication Sig Dispense Refill   albuterol (PROAIR HFA) 108 (90 Base) MCG/ACT inhaler Inhale 2 puffs into  the lungs every 6 (six) hours as needed. For shortness of breath and wheezing 1 Inhaler 6   amLODipine (NORVASC) 2.5 MG tablet Take 1 tablet (2.5 mg total) by mouth daily. 90 tablet 1   amoxicillin (AMOXIL) 500 MG capsule Take 500 mg by mouth 3 (three) times daily. Prior to dental appointments (Patient not taking: Reported on 06/20/2021)     azithromycin (ZITHROMAX) 250 MG tablet Take 2 tablets by mouth first day, then 1 tablet for 4 additional days 6 tablet 0   BLACK CURRANT SEED OIL PO Take by mouth daily.     Carboxymethylcellulose Sodium (EYE DROPS OP) Apply to eye daily.     Certolizumab Pegol (CIMZIA) 2 X 200 MG KIT Inject into the skin every 28 (twenty-eight) days.     diclofenac (VOLTAREN) 75 MG EC tablet Take 1 tablet (75 mg total) by mouth 2 (two) times daily as needed. (Patient not taking: Reported on 06/20/2021) 60 tablet 2   fluticasone (FLONASE) 50 MCG/ACT nasal spray Place 2 sprays into both nostrils daily as needed. 16 g 6   Ginger, Zingiber officinalis, (GINGER ROOT PO) Take by mouth.     HYDROcodone-acetaminophen (NORCO/VICODIN) 5-325 MG tablet Take 1-2 tablets by mouth every 8 (eight) hours as needed. (Patient not taking: Reported on 06/20/2021) 15 tablet 0   meclizine (ANTIVERT) 12.5 MG tablet Take 1 tablet (12.5 mg total) by mouth 3 (three) times daily as needed for dizziness. (Patient not taking: Reported on 06/20/2021) 30 tablet 0   Misc Natural Products (TART CHERRY ADVANCED PO) Take by mouth.     prednisoLONE acetate (PRED FORTE) 1 % ophthalmic suspension Place 1 drop into both eyes daily.     Spacer/Aero-Holding Chambers (AEROCHAMBER MV) inhaler Use as instructed 1 each 0   TURMERIC PO Take by mouth.     Vitamin D, Ergocalciferol, (DRISDOL) 1.25 MG (50000 UNIT) CAPS capsule Take 1 capsule (50,000 Units total) by mouth every 7 (seven) days. 12 capsule 0   No current facility-administered medications for this visit.    Allergies:   Etanercept, Methotrexate derivatives, and  Pregabalin   Social History:  The patient  reports that she has never smoked. She has never used smokeless tobacco. She reports that she does not drink alcohol and does not use drugs.   Family History:  The patient's family history includes Asthma in her son; Breast cancer in an other family member; Diabetes in her mother and another family member; Eczema in her son; Glaucoma in her father; Heart disease in her maternal grandmother; Hypertension in her mother, sister, and sister; Kidney failure in her mother; Rheum arthritis in her maternal grandmother; Sinusitis in her daughter; Stomach cancer (age of onset: 28) in her maternal grandmother; Thyroid disease in her sister and sister.  ROS:  Please see the history of present illness.   All other systems are reviewed and  otherwise negative.   PHYSICAL EXAM: VS:  LMP 05/01/2016  BMI: There is no height or weight on file to calculate BMI.  A recheck manually of her BP isi 156/70 (seated) Well nourished, well developed, in no acute distress  HEENT: normocephalic, atraumatic  Neck: no JVD, very soft L bruit Cardiac: RRR; bradycardic, no significant murmurs, no rubs, or gallops Lungs:  CTA b/l, no wheezing, rhonchi or rales  Abd: soft, nontender MS: no deformity or atrophy Ext: no edema  Skin: warm and dry, no rash Neuro:  No gross deficits appreciated Psych: euthymic mood, full affect    EKG:  done today and reviewed by myself SB,48bpm a couple junctional beats, narrow/unchanged QRS   02/15/2019: EST Blood pressure demonstrated a hypertensive response to exercise. No T wave inversion was noted during stress. Upsloping ST segment depression ST segment depression of 1 mm was noted during stress in the II, III, aVF, V6, V5 and V4 leads, and returning to baseline after 1-5 minutes of recovery. The patient experienced no angina during the stress test Blood pressure demonstrated a hypertensive response to exercise Overall, the patient's  exercise capacity was excellent Duke Treadmill Score: low risk   Negative stress test without evidence of ischemia at given workload. Hypertensive response to exercise. Excellent exercise capacity - 13.4 METS.    05/07/16: TTE Summary Normal left ventricular size and systolic function with no appreciable segmental abnormality. Ejection fraction is visually estimated at 55-60% Structurally normal mitral valve with good mobility. Trace mitral regurgitation. Mild tricuspid regurgitation   03/20/14: TTE Study Conclusions - Left ventricle: The cavity size was normal. Wall thickness was   normal. Systolic function was low normal to mildly reduced. The   estimated ejection fraction was in the range of 50% to 55%. Wall   motion was normal; there were no regional wall motion   abnormalities. Features are consistent with a pseudonormal left   ventricular filling pattern, with concomitant abnormal relaxation   and increased filling pressure (grade 2 diastolic dysfunction). - Aortic valve: There was trivial regurgitation. - Mitral valve: There was mild regurgitation. - Right ventricle: The cavity size was normal. Systolic function   was normal. - Atrial septum: No residual ASD noted. - Tricuspid valve: There was mild-moderate regurgitation. Peak   RV-RA gradient (S): 36 mm Hg. - Pulmonary arteries: PA peak pressure: 44 mm Hg (S). - Systemic veins: IVC measured 2.2 cm with > 50% respirophasic   variation, suggesting RA pressure 8 mmHg. Impressions: - The patient was profoundly bradycardic during this study. Normal   LV size with low normal to mildly decreased systolic function, EF   45-85%. Moderate diastolic dysfunction. Normal RV size and   systolic function. Mild to moderate TR. Mild pulmonary   hypertension.  Recent Labs: 04/08/2021: ALT 12; BUN 15; Creatinine, Ser 0.84; Potassium 4.5; Sodium 144 04/30/2021: Hemoglobin 12.4; Platelets 157 06/20/2021: Magnesium 1.9; TSH 3.47   06/20/2021: Cholesterol 130; HDL 55; LDL Cholesterol (Calc) 62; Total CHOL/HDL Ratio 2.4; Triglycerides 53   CrCl cannot be calculated (Patient's most recent lab result is older than the maximum 21 days allowed.).   Wt Readings from Last 3 Encounters:  06/20/21 129 lb 4 oz (58.6 kg)  04/30/21 133 lb 12.8 oz (60.7 kg)  03/06/21 131 lb (59.4 kg)     Other studies reviewed: Additional studies/records reviewed today include: summarized above including [place] record  ASSESSMENT AND PLAN:  1. SN dysfunction     No symptoms of bradycardia  We discussed symptoms of bradycardia       2. Hx of ASD repair     Pt follows prophylaxis regime, her dentis prescribes this (amoxil 2g prior to dental work)  3. HTN     Looks good, no changes today     No nodal blocker for BP management  She mentions family hx of CAD, inquiries about screening for her. Recentl labs reportedly with good Lipid panel Eating healthy, exercises regularly No smoking/ETOH   Disposition: we will get a coronary Ca score, and we can continue annual visits    Current medicines are reviewed at length with the patient today.  The patient did not have any concerns regarding medicines.  Haywood Lasso, PA-C 07/25/2021 6:02 AM     [facility] [address] [place] [address] [phone] (office)  [phone] (fax)

## 2021-08-15 ENCOUNTER — Telehealth: Payer: Self-pay | Admitting: Pharmacist

## 2021-08-15 NOTE — Telephone Encounter (Signed)
In process of submission of request for Verification of Benefits for patient's in-office Cimzia via [employer] portal. However, patient's last in-office Cimzia was on 05/14/21 so if she re-starts she will need re-authorization for loading dose again. Patient also has Publishing copy to [employer]  ? ?Knox Saliva, PharmD, MPH, BCPS ?Clinical Pharmacist (Rheumatology and Pulmonology) ?

## 2021-08-28 ENCOUNTER — Ambulatory Visit: Payer: Managed Care, Other (non HMO) | Admitting: Obstetrics & Gynecology

## 2021-09-12 ENCOUNTER — Inpatient Hospital Stay: Admission: RE | Admit: 2021-09-12 | Payer: Managed Care, Other (non HMO) | Source: Ambulatory Visit

## 2021-09-21 ENCOUNTER — Other Ambulatory Visit: Payer: Self-pay | Admitting: Internal Medicine

## 2021-10-01 ENCOUNTER — Encounter: Payer: Self-pay | Admitting: General Practice

## 2021-10-01 ENCOUNTER — Ambulatory Visit (INDEPENDENT_AMBULATORY_CARE_PROVIDER_SITE_OTHER): Payer: Managed Care, Other (non HMO) | Admitting: Family Medicine

## 2021-10-01 ENCOUNTER — Encounter: Payer: Self-pay | Admitting: Family Medicine

## 2021-10-01 ENCOUNTER — Other Ambulatory Visit (HOSPITAL_COMMUNITY)
Admission: RE | Admit: 2021-10-01 | Discharge: 2021-10-01 | Disposition: A | Discharge status: Home / Self Care | Payer: Managed Care, Other (non HMO) | Source: Ambulatory Visit | Attending: Family Medicine | Admitting: Family Medicine

## 2021-10-01 VITALS — BP 143/89 | HR 49 | Ht 68.5 in | Wt 131.0 lb

## 2021-10-01 DIAGNOSIS — Z01419 Encounter for gynecological examination (general) (routine) without abnormal findings: Secondary | ICD-10-CM

## 2021-10-01 NOTE — Progress Notes (Signed)
? ? ?GYNECOLOGY ANNUAL PREVENTATIVE CARE ENCOUNTER NOTE ? ?Subjective:  ? Brittany Hodges is a 56 y.o. [phone] female here for a routine annual gynecologic exam.  Current complaints: none. Menopausal for 6 years. Not sexually active. No vasomotor sxs.   Denies abnormal vaginal bleeding, discharge, pelvic pain, problems with intercourse or other gynecologic concerns.  ?  ?Gynecologic History ?Patient's last menstrual period was 05/01/2016. ?Patient is not sexually active  ?Contraception: post menopausal status ?Last Pap: uncertain. Results were: normal ?Last mammogram: 2021. Results were: birads 1 ? ? ? ?The pregnancy intention screening data noted above was reviewed. Potential methods of contraception were discussed. The patient elected to proceed with No data recorded. ? ? ?Obstetric History ?OB History  ?Gravida Para Term Preterm AB Living  ?'5 3 3   2 2  '$ ?SAB IAB Ectopic Multiple Live Births  ?        2  ?  ?# Outcome Date GA Lbr Len/2nd Weight Sex Delivery Anes PTL Lv  ?5 Term 1995 [redacted]w[redacted]d  M Vag-Spont None N   ?4 Term 12744w0d M Vag-Spont None N LIV  ?3 AB 1991          ?2 AB 1990          ?1 Term 1988 4010w0dF Vag-Spont None N LIV  ? ? ?Past Medical History:  ?Diagnosis Date  ? Allergic rhinitis, cause unspecified   ? Anemia   ? Asthma   ? Atrial septal defect   ? s/p repair;   Echo 08/07/10: EF 55-60%; mild MR; atrial septal aneurysm; no residual ASD  ? Chest pain, unspecified   ? cardiac cath 07/31/10: normal coronaries; vigorous LVF  ? Esophageal reflux   ? Fibromyalgia   ? Headache(784.0)   ? Hematuria, unspecified   ? Hypertension   ? no meds   ? Hypothyroidism   ? Irritable bowel syndrome   ? Low back pain   ? Rheumatoid arthritis(714.0)   ? Dr DavHerold Harms Scoliosis   ? Sinoatrial node dysfunction (HCC)   ? eval. for chronotropic competence completed in past  ? ? ?Past Surgical History:  ?Procedure Laterality Date  ? ASD REPAIR  1985  ? TONSILLECTOMY AND ADENOIDECTOMY    ? TUBAL LIGATION     ? ? ?Current Outpatient Medications on File Prior to Visit  ?Medication Sig Dispense Refill  ? amLODipine (NORVASC) 2.5 MG tablet TAKE 1 TABLET BY MOUTH EVERY DAY 90 tablet 3  ? BLACK CURRANT SEED OIL PO Take by mouth daily.    ? Carboxymethylcellulose Sodium (EYE DROPS OP) Apply to eye daily.    ? prednisoLONE acetate (PRED FORTE) 1 % ophthalmic suspension Place 1 drop into both eyes daily.    ? ?No current facility-administered medications on file prior to visit.  ? ? ?Allergies  ?Allergen Reactions  ? Etanercept   ? Methotrexate Derivatives   ?  Caused her WBC to drop  ? Pregabalin   ? ? ?Social History  ? ?Socioeconomic History  ? Marital status: Single  ?  Spouse name: Not on file  ? Number of children: 3  ? Years of education: Not on file  ? Highest education level: Not on file  ?Occupational History  ? Occupation: [employer] pt access  ?Tobacco Use  ? Smoking status: Never  ? Smokeless tobacco: Never  ? Tobacco comments:  ?  never used tobacco  ?Vaping Use  ? Vaping Use: Never used  ?  Substance and Sexual Activity  ? Alcohol use: No  ? Drug use: No  ? Sexual activity: Not Currently  ?  Birth control/protection: Surgical  ?Other Topics Concern  ? Not on file  ?Social History Narrative  ? Household-- lives by herself  ? ?Social Determinants of Health  ? ?Financial Resource Strain: Not on file  ?Food Insecurity: Not on file  ?Transportation Needs: Not on file  ?Physical Activity: Not on file  ?Stress: Not on file  ?Social Connections: Not on file  ?Intimate Partner Violence: Not on file  ? ? ?Family History  ?Problem Relation Age of Onset  ? Hypertension Mother   ? Diabetes Mother   ? Kidney failure Mother   ? Heart disease Maternal Grandmother   ? Stomach cancer Maternal Grandmother 95  ? Rheum arthritis Maternal Grandmother   ? Glaucoma Father   ? Breast cancer Other   ?     aunt  ? Diabetes Other   ?     GM  ? Hypertension Sister   ? Thyroid disease Sister   ? Hypertension Sister   ? Thyroid disease Sister   ?  Asthma Son   ? Eczema Son   ? Sinusitis Daughter   ? Colon cancer Neg Hx   ? Allergic rhinitis Neg Hx   ? Angioedema Neg Hx   ? Immunodeficiency Neg Hx   ? ? ?The following portions of the patient's history were reviewed and updated as appropriate: allergies, current medications, past family history, past medical history, past social history, past surgical history and problem list. ? ?Review of Systems ?Pertinent items are noted in HPI. ?  ?Objective:  ?BP (!) 143/89   Pulse (!) 49   Ht 5' 8.5" (1.74 m)   Wt 131 lb (59.4 kg)   LMP 05/01/2016   BMI 19.63 kg/m?  ?Wt Readings from Last 3 Encounters:  ?10/01/21 131 lb (59.4 kg)  ?07/25/21 129 lb 9.6 oz (58.8 kg)  ?06/20/21 129 lb 4 oz (58.6 kg)  ?  ? ?Chaperone present during exam ? ?CONSTITUTIONAL: Well-developed, well-nourished female in no acute distress.  ?HENT:  Normocephalic, atraumatic, External right and left ear normal. Oropharynx is clear and moist ?EYES: Conjunctivae and EOM are normal. Pupils are equal, round, and reactive to light. No scleral icterus.  ?NECK: Normal range of motion, supple, no masses.  Normal thyroid.  ? ?CARDIOVASCULAR: Normal heart rate noted, regular rhythm ?RESPIRATORY: Clear to auscultation bilaterally. Effort and breath sounds normal, no problems with respiration noted. ?BREASTS: Symmetric in size. No masses, skin changes, nipple drainage, or lymphadenopathy. ?ABDOMEN: Soft, normal bowel sounds, no distention noted.  No tenderness, rebound or guarding.  ?PELVIC: Normal appearing external genitalia; normal appearing vaginal mucosa and cervix.  No abnormal discharge noted.  Normal uterine size, no other palpable masses, no uterine or adnexal tenderness. ?MUSCULOSKELETAL: Normal range of motion. No tenderness.  No cyanosis, clubbing, or edema.  2+ distal pulses. ?SKIN: Skin is warm and dry. No rash noted. Not diaphoretic. No erythema. No pallor. ?NEUROLOGIC: Alert and oriented to person, place, and time. Normal reflexes, muscle tone  coordination. No cranial nerve deficit noted. ?PSYCHIATRIC: Normal mood and affect. Normal behavior. Normal judgment and thought content. ? ?Assessment:  ?Annual gynecologic examination with pap smear ?  ?Plan:  ?1. Well Woman Exam ?Will follow up results of pap smear and manage accordingly. ?Mammogram scheduled ?STD testing discussed. Patient declined testing ? ?Routine preventative health maintenance measures emphasized. ?Please refer to After Visit Summary for  other counseling recommendations.  ? ? ?Loma Boston, DO ?[facility] ?

## 2021-10-02 LAB — CYTOLOGY - PAP
Comment: NEGATIVE
Diagnosis: NEGATIVE
High risk HPV: NEGATIVE

## 2021-10-02 NOTE — Progress Notes (Deleted)
Office Visit Note  Patient: Brittany Hodges             Date of Birth: 04-15-1966           MRN: [id number]             PCP: Colon Branch, MD Referring: Colon Branch, MD Visit Date: 10/16/2021 Occupation: '@GUAROCC'$ @  Subjective:    History of Present Illness: Brittany Hodges is a 56 y.o. female with history of seropositive rheumatoid arthritis, osteoarthritis, and iritis.  Patient was initially started on in office administered Cimzia on 03/17/2021.  TB Gold 01/31/2021 negative.  Future order for TB Gold placed today. Patient overdue to update CBC and CMP.  Orders for CBC and CMP are released today.  Discussed the importance of remaining compliant with lab work every 3 months especially due to history of neutropenia.  Activities of Daily Living:  Patient reports morning stiffness for *** {minute/hour:19697}.   Patient {ACTIONS;DENIES/REPORTS:21021675::"Denies"} nocturnal pain.  Difficulty dressing/grooming: {ACTIONS;DENIES/REPORTS:21021675::"Denies"} Difficulty climbing stairs: {ACTIONS;DENIES/REPORTS:21021675::"Denies"} Difficulty getting out of chair: {ACTIONS;DENIES/REPORTS:21021675::"Denies"} Difficulty using hands for taps, buttons, cutlery, and/or writing: {ACTIONS;DENIES/REPORTS:21021675::"Denies"}  No Rheumatology ROS completed.   PMFS History:  Patient Active Problem List   Diagnosis Date Noted   Bradycardia 03/06/2021   Benign paroxysmal positional vertigo 03/06/2021   Mild intermittent asthma 12/22/2020   Chronic sinusitis 09/14/2019   Intolerance, food 09/14/2019   Asthmatic bronchitis 04/06/2016   Laryngopharyngeal reflux (LPR) 04/06/2016   PCP NOTES >>>>> 03/09/2015   Annual physical exam 12/21/2014   Cardiomegaly, by MRI 03/16/2014   Leukopenia 03/10/2014   Dizziness 02/10/2011   SINOATRIAL NODE DYSFUNCTION 08/06/2008   ATRIAL SEPTAL DEFECT 06/29/2007   Hypothyroidism 03/21/2007   Essential hypertension 03/21/2007   Perennial allergic rhinitis  03/21/2007   GERD-- dysphagia 03/21/2007   CONSTIPATION 03/21/2007   IRRITABLE BOWEL SYNDROME 03/21/2007   RA (rheumatoid arthritis) ([place]) 03/21/2007   LOW BACK PAIN 03/21/2007   FIBROMYALGIA 03/21/2007    Past Medical History:  Diagnosis Date   Allergic rhinitis, cause unspecified    Anemia    Asthma    Atrial septal defect    s/p repair;   Echo 08/07/10: EF 55-60%; mild MR; atrial septal aneurysm; no residual ASD   Chest pain, unspecified    cardiac cath 07/31/10: normal coronaries; vigorous LVF   Esophageal reflux    Fibromyalgia    Headache(784.0)    Hematuria, unspecified    Hypertension    no meds    Hypothyroidism    Irritable bowel syndrome    Low back pain    Rheumatoid arthritis(714.0)    Dr Herold Harms   Scoliosis    Sinoatrial node dysfunction (HCC)    eval. for chronotropic competence completed in past    Family History  Problem Relation Age of Onset   Hypertension Mother    Diabetes Mother    Kidney failure Mother    Heart disease Maternal Grandmother    Stomach cancer Maternal Grandmother 28   Rheum arthritis Maternal Grandmother    Glaucoma Father    Breast cancer Other        aunt   Diabetes Other        GM   Hypertension Sister    Thyroid disease Sister    Hypertension Sister    Thyroid disease Sister    Asthma Son    Eczema Son    Sinusitis Daughter    Colon cancer Neg Hx    Allergic rhinitis Neg Hx  Angioedema Neg Hx    Immunodeficiency Neg Hx    Past Surgical History:  Procedure Laterality Date   ASD REPAIR  1985   TONSILLECTOMY AND ADENOIDECTOMY     TUBAL LIGATION     Social History   Social History Narrative   Household-- lives by herself   Immunization History  Administered Date(s) Administered   Influenza Split 03/17/2011, 03/17/2012   Influenza Whole 04/05/2009, 02/21/2010   Influenza, Seasonal, Injecte, Preservative Fre 03/11/2021   Influenza,inj,Quad PF,6+ Mos 03/21/2013, 03/09/2014, 05/24/2015, 01/27/2016    Influenza-Unspecified 07/30/2020   PFIZER(Purple Top)SARS-COV-2 Vaccination 12/16/2019, 01/16/2020   Pneumococcal Conjugate-13 01/27/2016   Pneumococcal Polysaccharide-23 03/08/2015   Td 07/23/2010   Tdap 05/28/2020     Objective: Vital Signs: LMP 05/01/2016    Physical Exam Vitals and nursing note reviewed.  Constitutional:      Appearance: She is well-developed.  HENT:     Head: Normocephalic and atraumatic.  Eyes:     Conjunctiva/sclera: Conjunctivae normal.  Cardiovascular:     Rate and Rhythm: Normal rate and regular rhythm.     Heart sounds: Normal heart sounds.  Pulmonary:     Effort: Pulmonary effort is normal.     Breath sounds: Normal breath sounds.  Abdominal:     General: Bowel sounds are normal.     Palpations: Abdomen is soft.  Musculoskeletal:     Cervical back: Normal range of motion.  Skin:    General: Skin is warm and dry.     Capillary Refill: Capillary refill takes less than 2 seconds.  Neurological:     Mental Status: She is alert and oriented to person, place, and time.  Psychiatric:        Behavior: Behavior normal.     Musculoskeletal Exam: ***  CDAI Exam: CDAI Score: -- Patient Global: --; Provider Global: -- Swollen: --; Tender: -- Joint Exam 10/16/2021   No joint exam has been documented for this visit   There is currently no information documented on the homunculus. Go to the Rheumatology activity and complete the homunculus joint exam.  Investigation: No additional findings.  Imaging: No results found.  Recent Labs: Lab Results  Component Value Date   WBC 4.1 04/30/2021   HGB 12.4 04/30/2021   PLT 157 04/30/2021   NA 144 04/08/2021   K 4.5 04/08/2021   CL 104 04/08/2021   CO2 27 04/08/2021   GLUCOSE 80 04/08/2021   BUN 15 04/08/2021   CREATININE 0.84 04/08/2021   BILITOT 0.3 04/08/2021   ALKPHOS 74 04/08/2021   AST 19 04/08/2021   ALT 12 04/08/2021   PROT 7.9 04/08/2021   ALBUMIN 4.5 04/08/2021   CALCIUM 10.1  04/08/2021   GFRAA 89 02/15/2020   QFTBGOLDPLUS NEGATIVE 01/31/2021    Speciality Comments: Dexa- 05/27/18 Osteopenia T-Score -1.7 BMD 0.963  Prior therapy includes: Plaquenil (inadequate response) , Enbrel (injection site reaction) and methotrexate (neutropenia so on low dose).  Procedures:  No procedures performed Allergies: Etanercept, Methotrexate derivatives, and Pregabalin   Assessment / Plan:     Visit Diagnoses: No diagnosis found.  Orders: No orders of the defined types were placed in this encounter.  No orders of the defined types were placed in this encounter.   Face-to-face time spent with patient was *** minutes. Greater than 50% of time was spent in counseling and coordination of care.  Follow-Up Instructions: No follow-ups on file.   Earnestine Mealing, CMA  Note - This record has been created using Editor, commissioning.  Chart creation errors have been sought, but may not always  have been located. Such creation errors do not reflect on  the standard of medical care.

## 2021-10-03 ENCOUNTER — Ambulatory Visit (INDEPENDENT_AMBULATORY_CARE_PROVIDER_SITE_OTHER)
Admission: RE | Admit: 2021-10-03 | Discharge: 2021-10-03 | Disposition: A | Discharge status: Home / Self Care | Payer: Self-pay | Source: Ambulatory Visit | Attending: Physician Assistant | Admitting: Physician Assistant

## 2021-10-03 DIAGNOSIS — R001 Bradycardia, unspecified: Secondary | ICD-10-CM

## 2021-10-08 ENCOUNTER — Encounter: Payer: Self-pay | Admitting: Internal Medicine

## 2021-10-16 ENCOUNTER — Ambulatory Visit: Payer: Managed Care, Other (non HMO) | Admitting: Physician Assistant

## 2021-10-16 DIAGNOSIS — M5030 Other cervical disc degeneration, unspecified cervical region: Secondary | ICD-10-CM

## 2021-10-16 DIAGNOSIS — M19071 Primary osteoarthritis, right ankle and foot: Secondary | ICD-10-CM

## 2021-10-16 DIAGNOSIS — M0579 Rheumatoid arthritis with rheumatoid factor of multiple sites without organ or systems involvement: Secondary | ICD-10-CM

## 2021-10-16 DIAGNOSIS — H209 Unspecified iridocyclitis: Secondary | ICD-10-CM

## 2021-10-16 DIAGNOSIS — Z8719 Personal history of other diseases of the digestive system: Secondary | ICD-10-CM

## 2021-10-16 DIAGNOSIS — Z8639 Personal history of other endocrine, nutritional and metabolic disease: Secondary | ICD-10-CM

## 2021-10-16 DIAGNOSIS — Z79899 Other long term (current) drug therapy: Secondary | ICD-10-CM

## 2021-10-16 DIAGNOSIS — Z8679 Personal history of other diseases of the circulatory system: Secondary | ICD-10-CM

## 2021-10-16 DIAGNOSIS — M8589 Other specified disorders of bone density and structure, multiple sites: Secondary | ICD-10-CM

## 2021-10-16 DIAGNOSIS — Z87898 Personal history of other specified conditions: Secondary | ICD-10-CM

## 2021-10-16 DIAGNOSIS — M19041 Primary osteoarthritis, right hand: Secondary | ICD-10-CM

## 2021-10-16 DIAGNOSIS — D708 Other neutropenia: Secondary | ICD-10-CM

## 2021-10-16 DIAGNOSIS — M545 Low back pain, unspecified: Secondary | ICD-10-CM

## 2021-10-16 DIAGNOSIS — Z8669 Personal history of other diseases of the nervous system and sense organs: Secondary | ICD-10-CM

## 2021-10-16 DIAGNOSIS — Z8739 Personal history of other diseases of the musculoskeletal system and connective tissue: Secondary | ICD-10-CM

## 2021-10-16 DIAGNOSIS — M7061 Trochanteric bursitis, right hip: Secondary | ICD-10-CM

## 2021-10-23 ENCOUNTER — Ambulatory Visit: Payer: Managed Care, Other (non HMO) | Admitting: Family

## 2021-10-24 ENCOUNTER — Ambulatory Visit (INDEPENDENT_AMBULATORY_CARE_PROVIDER_SITE_OTHER): Payer: Managed Care, Other (non HMO) | Admitting: Family

## 2021-10-24 ENCOUNTER — Encounter: Payer: Self-pay | Admitting: Family

## 2021-10-24 VITALS — BP 132/82 | HR 68 | Temp 98.3°F | Resp 16 | Ht 68.5 in | Wt 131.0 lb

## 2021-10-24 DIAGNOSIS — J019 Acute sinusitis, unspecified: Secondary | ICD-10-CM

## 2021-10-24 DIAGNOSIS — H109 Unspecified conjunctivitis: Secondary | ICD-10-CM

## 2021-10-24 DIAGNOSIS — R5383 Other fatigue: Secondary | ICD-10-CM

## 2021-10-24 MED ORDER — AMOXICILLIN-POT CLAVULANATE 875-125 MG PO TABS: 1.0000 | ORAL_TABLET | Freq: Two times a day (BID) | ORAL | 0 refills | Status: AC

## 2021-10-24 MED ORDER — TOBRAMYCIN 0.3 % OP SOLN: 1.0000 [drp] | Freq: Four times a day (QID) | OPHTHALMIC | 0 refills | Status: DC

## 2021-10-24 MED ORDER — PREDNISONE 20 MG PO TABS: 20.0000 mg | ORAL_TABLET | Freq: Every day | ORAL | 0 refills | Status: DC

## 2021-10-24 NOTE — Progress Notes (Signed)
Brittany Hodges is a 56 y.o. female with the following history as recorded in Epic[employer]:  Patient Active Problem List   Diagnosis Date Noted   Bradycardia 03/06/2021   Benign paroxysmal positional vertigo 03/06/2021   Mild intermittent asthma 12/22/2020   Chronic sinusitis 09/14/2019   Intolerance, food 09/14/2019   Asthmatic bronchitis 04/06/2016   Laryngopharyngeal reflux (LPR) 04/06/2016   PCP NOTES >>>>> 03/09/2015   Annual physical exam 12/21/2014   Cardiomegaly, by MRI 03/16/2014   Leukopenia 03/10/2014   Dizziness 02/10/2011   SINOATRIAL NODE DYSFUNCTION 08/06/2008   ATRIAL SEPTAL DEFECT 06/29/2007   Hypothyroidism 03/21/2007   Essential hypertension 03/21/2007   Perennial allergic rhinitis 03/21/2007   GERD-- dysphagia 03/21/2007   CONSTIPATION 03/21/2007   IRRITABLE BOWEL SYNDROME 03/21/2007   RA (rheumatoid arthritis) ([place]) 03/21/2007   LOW BACK PAIN 03/21/2007   FIBROMYALGIA 03/21/2007    Current Outpatient Medications  Medication Sig Dispense Refill   amLODipine (NORVASC) 2.5 MG tablet TAKE 1 TABLET BY MOUTH EVERY DAY 90 tablet 3   amoxicillin-clavulanate (AUGMENTIN) 875-125 MG tablet Take 1 tablet by mouth 2 (two) times daily for 10 days. 20 tablet 0   BLACK CURRANT SEED OIL PO Take by mouth daily.     Carboxymethylcellulose Sodium (EYE DROPS OP) Apply to eye daily.     prednisoLONE acetate (PRED FORTE) 1 % ophthalmic suspension Place 1 drop into both eyes daily.     predniSONE (DELTASONE) 20 MG tablet Take 1 tablet (20 mg total) by mouth daily with breakfast. 5 tablet 0   tobramycin (TOBREX) 0.3 % ophthalmic solution Place 1 drop into both eyes every 6 (six) hours. 5 mL 0   No current facility-administered medications for this visit.    Allergies: Etanercept, Methotrexate derivatives, and Pregabalin  Past Medical History:  Diagnosis Date   Allergic rhinitis, cause unspecified    Anemia    Asthma    Atrial septal defect    s/p repair;   Echo 08/07/10: EF  55-60%; mild MR; atrial septal aneurysm; no residual ASD   Chest pain, unspecified    cardiac cath 07/31/10: normal coronaries; vigorous LVF   Esophageal reflux    Fibromyalgia    Headache(784.0)    Hematuria, unspecified    Hypertension    no meds    Hypothyroidism    Irritable bowel syndrome    Low back pain    Rheumatoid arthritis(714.0)    Dr Herold Harms   Scoliosis    Sinoatrial node dysfunction (HCC)    eval. for chronotropic competence completed in past    Past Surgical History:  Procedure Laterality Date   ASD REPAIR  1985   TONSILLECTOMY AND ADENOIDECTOMY     TUBAL LIGATION      Family History  Problem Relation Age of Onset   Hypertension Mother    Diabetes Mother    Kidney failure Mother    Heart disease Maternal Grandmother    Stomach cancer Maternal Grandmother 34   Rheum arthritis Maternal Grandmother    Glaucoma Father    Breast cancer Other        aunt   Diabetes Other        GM   Hypertension Sister    Thyroid disease Sister    Hypertension Sister    Thyroid disease Sister    Asthma Son    Eczema Son    Sinusitis Daughter    Colon cancer Neg Hx    Allergic rhinitis Neg Hx    Angioedema Neg  Hx    Immunodeficiency Neg Hx     Social History   Tobacco Use   Smoking status: Never   Smokeless tobacco: Never   Tobacco comments:    never used tobacco  Substance Use Topics   Alcohol use: No    Subjective:   Patient presents with concerns for sinus infection. Symptoms present x 1 week; + right ear pain; +sinus pain/ pressure; + bilateral eye redness/ irritation; no drainage from eyes;   She also asks if labs can be drawn "just to check things."    Objective:  Vitals:   10/24/21 1428  BP: 132/82  Pulse: 68  Resp: 16  Temp: 98.3 F (36.8 C)  TempSrc: Oral  SpO2: 98%  Weight: 131 lb (59.4 kg)  Height: 5' 8.5" (1.74 m)    General: Well developed, well nourished, in no acute distress  Skin : Warm and dry.  Head: Normocephalic and  atraumatic  Eyes: Sclera and conjunctiva erythematous bilaterally; pupils round and reactive to light; extraocular movements intact  Ears: External normal; canals clear; tympanic membranes congested Oropharynx: Pink, supple. No suspicious lesions  Neck: Supple without thyromegaly, adenopathy  Lungs: Respirations unlabored; clear to auscultation bilaterally without wheeze, rales, rhonchi  Neurologic: Alert and oriented; speech intact; face symmetrical; moves all extremities well; CNII-XII intact without focal deficit   Assessment:  1. Acute sinusitis, recurrence not specified, unspecified location   2. Conjunctivitis of both eyes, unspecified conjunctivitis type   3. Other fatigue     Plan:  Rx for Augmentin 875 mg bid x 10 days; Rx for Prednisone 20 mg qd x 5 days; Rx for Tobrex to use in both eyes; she understands that if eyes are not improved by Monday, she needs to see her eye doctor due to history of iritis/ uveitis; follow up worse, no better;  Check CBC, CMP today;   No follow-ups on file.  Orders Placed This Encounter  Procedures   CBC with Differential/Platelet   Comp Met (CMET)    Requested Prescriptions   Signed Prescriptions Disp Refills   predniSONE (DELTASONE) 20 MG tablet 5 tablet 0    Sig: Take 1 tablet (20 mg total) by mouth daily with breakfast.   amoxicillin-clavulanate (AUGMENTIN) 875-125 MG tablet 20 tablet 0    Sig: Take 1 tablet by mouth 2 (two) times daily for 10 days.   tobramycin (TOBREX) 0.3 % ophthalmic solution 5 mL 0    Sig: Place 1 drop into both eyes every 6 (six) hours.

## 2021-10-25 LAB — CBC WITH DIFFERENTIAL/PLATELET
Absolute Monocytes: 805 cells/uL (ref 200–950)
Basophils Absolute: 18 cells/uL (ref 0–200)
Basophils Relative: 0.4 %
Eosinophils Absolute: 28 cells/uL (ref 15–500)
Eosinophils Relative: 0.6 %
HCT: 38.9 % (ref 35.0–45.0)
Hemoglobin: 13 g/dL (ref 11.7–15.5)
Lymphs Abs: 1352 cells/uL (ref 850–3900)
MCH: 29.5 pg (ref 27.0–33.0)
MCHC: 33.4 g/dL (ref 32.0–36.0)
MCV: 88.4 fL (ref 80.0–100.0)
MPV: 12.4 fL (ref 7.5–12.5)
Monocytes Relative: 17.5 %
Neutro Abs: 2397 cells/uL (ref 1500–7800)
Neutrophils Relative %: 52.1 %
Platelets: 179 10*3/uL (ref 140–400)
RBC: 4.4 10*6/uL (ref 3.80–5.10)
RDW: 12 % (ref 11.0–15.0)
Total Lymphocyte: 29.4 %
WBC: 4.6 10*3/uL (ref 3.8–10.8)

## 2021-10-25 LAB — COMPREHENSIVE METABOLIC PANEL
AG Ratio: 1.2 (calc) (ref 1.0–2.5)
ALT: 11 U/L (ref 6–29)
AST: 19 U/L (ref 10–35)
Albumin: 4.3 g/dL (ref 3.6–5.1)
Alkaline phosphatase (APISO): 72 U/L (ref 37–153)
BUN: 10 mg/dL (ref 7–25)
CO2: 27 mmol/L (ref 20–32)
Calcium: 10.1 mg/dL (ref 8.6–10.4)
Chloride: 103 mmol/L (ref 98–110)
Creat: 0.85 mg/dL (ref 0.50–1.03)
Globulin: 3.6 g/dL (calc) (ref 1.9–3.7)
Glucose, Bld: 84 mg/dL (ref 65–99)
Potassium: 4.3 mmol/L (ref 3.5–5.3)
Sodium: 138 mmol/L (ref 135–146)
Total Bilirubin: 0.5 mg/dL (ref 0.2–1.2)
Total Protein: 7.9 g/dL (ref 6.1–8.1)

## 2021-11-03 ENCOUNTER — Telehealth: Payer: Self-pay

## 2021-11-04 NOTE — Telephone Encounter (Signed)
Done

## 2021-12-05 LAB — HM MAMMOGRAPHY

## 2021-12-08 ENCOUNTER — Encounter: Payer: Self-pay | Admitting: Internal Medicine

## 2021-12-19 NOTE — Progress Notes (Signed)
Office Visit Note  Patient: Brittany Hodges             Date of Birth: 08-Feb-1966           MRN: [id number]             PCP: Colon Branch, MD Referring: Colon Branch, MD Visit Date: 01/02/2022 Occupation: '@GUAROCC'$ @  Subjective:  Recurrent iritis flares  History of Present Illness: Brittany Hodges is a 56 y.o. female with history of seropositive rheumatoid arthritis and iritis.  Patient is not currently taking any immunosuppressive agents.  She had 1 dose of Cimzia in October 2022 and her second dose in November 2022 but has been off of therapy since then.  She reports that she continues to have recurrent iritis flares.  She has been having about 1 flare per week and alternating eyes.  She states that the flares are typically most severe in her right eye.  She has been following up closely with Dr. Sabra Heck and is currently using prednisolone eyedrops twice daily as advised.  She is not currently on any oral prednisone or oral NSAIDs.  She would like to discuss restarting on Cimzia. She states this morning she woke up with some increased pain in her right wrist but denies any joint swelling.  She denies any other joint pain or inflammation at this time.  She remains active walking and running for exercise.  She denies any new medical conditions.  She denies any recent infections.      Activities of Daily Living:  Patient reports morning stiffness for 0 minutes.   Patient Denies nocturnal pain.  Difficulty dressing/grooming: Denies Difficulty climbing stairs: Denies Difficulty getting out of chair: Denies Difficulty using hands for taps, buttons, cutlery, and/or writing: Denies  Review of Systems  Constitutional:  Negative for fatigue.  HENT:  Negative for mouth sores and mouth dryness.   Eyes:  Positive for pain and redness. Negative for dryness.  Respiratory:  Negative for shortness of breath.   Cardiovascular:  Negative for chest pain and palpitations.  Gastrointestinal:   Negative for blood in stool, constipation and diarrhea.  Endocrine: Negative for increased urination.  Genitourinary:  Negative for involuntary urination.  Musculoskeletal:  Positive for joint pain and joint pain. Negative for gait problem, joint swelling, myalgias, muscle weakness, morning stiffness, muscle tenderness and myalgias.  Skin:  Negative for color change, rash, hair loss and sensitivity to sunlight.  Allergic/Immunologic: Negative for susceptible to infections.  Neurological:  Negative for dizziness and headaches.  Hematological:  Negative for swollen glands.  Psychiatric/Behavioral:  Negative for depressed mood and sleep disturbance. The patient is not nervous/anxious.     PMFS History:  Patient Active Problem List   Diagnosis Date Noted   Bradycardia 03/06/2021   Benign paroxysmal positional vertigo 03/06/2021   Mild intermittent asthma 12/22/2020   Chronic sinusitis 09/14/2019   Intolerance, food 09/14/2019   Asthmatic bronchitis 04/06/2016   Laryngopharyngeal reflux (LPR) 04/06/2016   PCP NOTES >>>>> 03/09/2015   Annual physical exam 12/21/2014   Cardiomegaly, by MRI 03/16/2014   Leukopenia 03/10/2014   Dizziness 02/10/2011   SINOATRIAL NODE DYSFUNCTION 08/06/2008   ATRIAL SEPTAL DEFECT 06/29/2007   Hypothyroidism 03/21/2007   Essential hypertension 03/21/2007   Perennial allergic rhinitis 03/21/2007   GERD-- dysphagia 03/21/2007   CONSTIPATION 03/21/2007   IRRITABLE BOWEL SYNDROME 03/21/2007   RA (rheumatoid arthritis) ([place]) 03/21/2007   LOW BACK PAIN 03/21/2007   FIBROMYALGIA 03/21/2007    Past Medical  History:  Diagnosis Date   Allergic rhinitis, cause unspecified    Anemia    Asthma    Atrial septal defect    s/p repair;   Echo 08/07/10: EF 55-60%; mild MR; atrial septal aneurysm; no residual ASD   Chest pain, unspecified    cardiac cath 07/31/10: normal coronaries; vigorous LVF   Esophageal reflux    Fibromyalgia    Headache(784.0)    Hematuria,  unspecified    Hypertension    no meds    Hypothyroidism    Irritable bowel syndrome    Low back pain    Rheumatoid arthritis(714.0)    Dr Herold Harms   Scoliosis    Sinoatrial node dysfunction (HCC)    eval. for chronotropic competence completed in past    Family History  Problem Relation Age of Onset   Hypertension Mother    Diabetes Mother    Kidney failure Mother    Heart disease Maternal Grandmother    Stomach cancer Maternal Grandmother 58   Rheum arthritis Maternal Grandmother    Glaucoma Father    Breast cancer Other        aunt   Diabetes Other        GM   Hypertension Sister    Thyroid disease Sister    Hypertension Sister    Thyroid disease Sister    Asthma Son    Eczema Son    Sinusitis Daughter    Colon cancer Neg Hx    Allergic rhinitis Neg Hx    Angioedema Neg Hx    Immunodeficiency Neg Hx    Past Surgical History:  Procedure Laterality Date   ASD REPAIR  1985   TONSILLECTOMY AND ADENOIDECTOMY     TUBAL LIGATION     Social History   Social History Narrative   Household-- lives by herself   Immunization History  Administered Date(s) Administered   Influenza Split 03/17/2011, 03/17/2012   Influenza Whole 04/05/2009, 02/21/2010   Influenza, Seasonal, Injecte, Preservative Fre 03/11/2021   Influenza,inj,Quad PF,6+ Mos 03/21/2013, 03/09/2014, 05/24/2015, 01/27/2016   Influenza-Unspecified 07/30/2020   PFIZER(Purple Top)SARS-COV-2 Vaccination 12/16/2019, 01/16/2020   Pneumococcal Conjugate-13 01/27/2016   Pneumococcal Polysaccharide-23 03/08/2015   Td 07/23/2010   Tdap 05/28/2020     Objective: Vital Signs: BP (!) 150/85 (BP Location: Left Arm, Patient Position: Sitting, Cuff Size: Normal)   Pulse (!) 43   Resp 14   Ht '5\' 9"'$  (1.753 m)   Wt 132 lb 3.2 oz (60 kg)   LMP 05/01/2016   BMI 19.52 kg/m    Physical Exam Vitals and nursing note reviewed.  Constitutional:      Appearance: She is well-developed.  HENT:     Head: Normocephalic  and atraumatic.  Eyes:     Comments: Conjunctival injection-both eyes  Cardiovascular:     Rate and Rhythm: Normal rate and regular rhythm.     Heart sounds: Normal heart sounds.  Pulmonary:     Effort: Pulmonary effort is normal.     Breath sounds: Normal breath sounds.  Abdominal:     General: Bowel sounds are normal.     Palpations: Abdomen is soft.  Musculoskeletal:     Cervical back: Normal range of motion.  Skin:    General: Skin is warm and dry.     Capillary Refill: Capillary refill takes less than 2 seconds.  Neurological:     Mental Status: She is alert and oriented to person, place, and time.  Psychiatric:  Behavior: Behavior normal.      Musculoskeletal Exam: C-spine, thoracic spine, lumbar spine have good range of motion.  No midline spinal tenderness.  She has tenderness over the right SI joint.  Shoulder joints, elbow joints, wrist joints, MCPs, PIPs, DIPs have good range of motion with no synovitis.  Ulnar prominence of the right wrist noted with some tenderness but no synovitis.  Complete fist formation bilaterally.  Hip joints have good range of motion with no groin pain.  Knee joints have good range of motion with no warmth or effusion.  Ankle joints have good range of motion with no tenderness or joint swelling.  CDAI Exam: CDAI Score: -- Patient Global: --; Provider Global: -- Swollen: --; Tender: -- Joint Exam 01/02/2022   No joint exam has been documented for this visit   There is currently no information documented on the homunculus. Go to the Rheumatology activity and complete the homunculus joint exam.  Investigation: No additional findings.  Imaging: No results found.  Recent Labs: Lab Results  Component Value Date   WBC 4.6 10/24/2021   HGB 13.0 10/24/2021   PLT 179 10/24/2021   NA 138 10/24/2021   K 4.3 10/24/2021   CL 103 10/24/2021   CO2 27 10/24/2021   GLUCOSE 84 10/24/2021   BUN 10 10/24/2021   CREATININE 0.85 10/24/2021    BILITOT 0.5 10/24/2021   ALKPHOS 74 04/08/2021   AST 19 10/24/2021   ALT 11 10/24/2021   PROT 7.9 10/24/2021   ALBUMIN 4.5 04/08/2021   CALCIUM 10.1 10/24/2021   GFRAA 89 02/15/2020   QFTBGOLDPLUS NEGATIVE 01/31/2021    Speciality Comments: Dexa- 05/27/18 Osteopenia T-Score -1.7 BMD 0.963  Prior therapy includes: Plaquenil (inadequate response) , Enbrel (injection site reaction) and methotrexate (neutropenia so on low dose).  Procedures:  No procedures performed Allergies: Etanercept, Methotrexate derivatives, and Pregabalin   Assessment / Plan:     Visit Diagnoses: Rheumatoid arthritis involving multiple sites with positive rheumatoid factor ([place]) - +Anti-CCP: She presents today with tenderness on the ulnar aspect of her right wrist but no synovitis.  She is not experiencing other joint pain or inflammation at this time.  She is not currently taking any immunosuppressive agents.  She has not been taking any oral prednisone or oral NSAIDs recently.  She presents today to discuss restarting on and off of Cimzia.  She previously initiated Cimzia in October 2022 and had her second dose in November 2022 but lost to follow-up.  She tolerated Cimzia without any side effects or injection site reactions.  Indications, contraindications, potential side effects of Cimzia were discussed today in detail.  All questions were addressed and consent was updated.  We will reapply for in office Cimzia and once approved she will return to the office for administration of the first injection.  She will require updated lab work today, 1 month, then every 3 months.  She will continue to require close lab monitoring and regular office visits.  She will follow-up in the office in 2 months or sooner if needed.  Counseled patient that Cimzia is a TNF blocking agent.  Counseled patient on purpose, proper use, and adverse effects of Cimzia.  Reviewed the most common adverse effects including infections, headache, and  injection site reactions. Discussed that there is the possibility of an increased risk of malignancy including non-melanoma skin cancer but it is not well understood if this increased risk is due to the medication or the disease state.  Advised patient to  get yearly dermatology exams due to risk of skin cancer.  Counseled patient that Cimzia should be held prior to scheduled surgery.  Counseled patient to avoid live vaccines while on Cimzia.  Recommend annual influenza, PCV 15 or PCV20 or Pneumovax 23, and Shingrix as indicated.   Reviewed the importance of regular labs while on Cimzia therapy. Will monitor CBC and CMP 1 month after starting and then every 3 months routinely thereafter. Will monitor TB gold annually. Standing orders placed. Provided patient with medication education material and answered all questions.  Patient voiced understanding.  Patient consented to Cimzia.  Will upload consent into the media tab.  Reviewed storage instructions for Cimzia.  Will apply for Cimzia through patient's insurance.  Advised initial injection must be administered in office.    High risk medication use - Currently on in-office Cimzia.  - Plan: CBC with Differential/Platelet, COMPLETE METABOLIC PANEL WITH GFR CBC and CMP updated on 10/24/21.  CBC and CMP were drawn today prior to reinitiating Cimzia.  Her next lab work will be due in 1 month and every 3 months to monitor for drug toxicity. TB gold negative on 01/31/21.  Future order for TB Gold will be placed today. She has not had any recent or recurrent infections.  Discussed the importance of holding Cimzia if she develops signs or symptoms of infection and to resume once the infection is completely cleared. No recent hospitalizations or new medical conditions.  Iritis - Dr. Teodoro Spray, recurrent iritis: She has been experiencing recurrent flares of iritis in both eyes.  She remains under the care of Dr. Sabra Heck with frequent follow-up visits.  She is  currently using prednisolone eyedrops in both eyes twice daily.  She presents today to discuss restarting her on Cimzia.  She previously initiated therapy in October 2022 and had a second dose in October 2022 but has lost follow-up.  Indications, contraindications, and potential side effects were reviewed today in the office in detail.  Consent was updated today.  We will check on the approval for in-office cimzia to restart the patient on therapy.   Other neutropenia ([place]): White blood cell count was 4.6.  Absolute neutrophils were within normal limits on 10/24/2021.  CBC with differential was rechecked today.  Once restarting Cimzia she will require updated lab work in 1 month and every 3 months to monitor for drug toxicity.  Primary osteoarthritis of both hands: She has PIP and DIP thickening consistent with osteoarthritis of both hands.  No tenderness or inflammation noted.  Primary osteoarthritis of both feet: She is not experiencing any discomfort in her feet at this time.  She wears proper fitting shoes.  DDD (degenerative disc disease), cervical: C-spine has good range of motion with no discomfort at this time.  No symptoms of radiculopathy.  Trochanteric bursitis, right hip - Resolved.   Chronic midline low back pain without sciatica - X-rays obtained on 02/16/2020 were consistent with mild scoliosis and facet joint arthropathy.  No midline spinal tenderness.  No symptoms of radiculopathy at this time.   Osteopenia of multiple sites - DEXA updated on 10/28/20: AP spine BMD 0.921 with T-score -1.1.  Statistically significant decrease in BMD of AP spine.     Other medical conditions are listed as follows:   History of scoliosis  History of migraine  History of IBS  History of hypothyroidism  History of hypertension  History of gastroesophageal reflux (GERD)  History of bradycardia/ sinoatrial node dysfunction   Orders: Orders Placed This  Encounter  Procedures   CBC with  Differential/Platelet   COMPLETE METABOLIC PANEL WITH GFR   No orders of the defined types were placed in this encounter.   Follow-Up Instructions: Return in about 2 months (around 03/04/2022) for Rheumatoid arthritis.   Ofilia Neas, PA-C  Note - This record has been created using Dragon software.  Chart creation errors have been sought, but may not always  have been located. Such creation errors do not reflect on  the standard of medical care.

## 2022-01-02 ENCOUNTER — Telehealth: Payer: Self-pay | Admitting: Pharmacist

## 2022-01-02 ENCOUNTER — Encounter: Payer: Self-pay | Admitting: Physician Assistant

## 2022-01-02 ENCOUNTER — Ambulatory Visit: Payer: Managed Care, Other (non HMO) | Attending: Physician Assistant | Admitting: Physician Assistant

## 2022-01-02 ENCOUNTER — Telehealth: Payer: Self-pay

## 2022-01-02 VITALS — BP 150/85 | HR 43 | Resp 14 | Ht 69.0 in | Wt 132.2 lb

## 2022-01-02 DIAGNOSIS — M19042 Primary osteoarthritis, left hand: Secondary | ICD-10-CM

## 2022-01-02 DIAGNOSIS — Z87898 Personal history of other specified conditions: Secondary | ICD-10-CM

## 2022-01-02 DIAGNOSIS — Z79899 Other long term (current) drug therapy: Secondary | ICD-10-CM | POA: Diagnosis not present

## 2022-01-02 DIAGNOSIS — Z8679 Personal history of other diseases of the circulatory system: Secondary | ICD-10-CM

## 2022-01-02 DIAGNOSIS — M8589 Other specified disorders of bone density and structure, multiple sites: Secondary | ICD-10-CM

## 2022-01-02 DIAGNOSIS — D708 Other neutropenia: Secondary | ICD-10-CM

## 2022-01-02 DIAGNOSIS — M19041 Primary osteoarthritis, right hand: Secondary | ICD-10-CM

## 2022-01-02 DIAGNOSIS — M0579 Rheumatoid arthritis with rheumatoid factor of multiple sites without organ or systems involvement: Secondary | ICD-10-CM | POA: Diagnosis not present

## 2022-01-02 DIAGNOSIS — G8929 Other chronic pain: Secondary | ICD-10-CM

## 2022-01-02 DIAGNOSIS — M5030 Other cervical disc degeneration, unspecified cervical region: Secondary | ICD-10-CM

## 2022-01-02 DIAGNOSIS — M545 Low back pain, unspecified: Secondary | ICD-10-CM

## 2022-01-02 DIAGNOSIS — M7061 Trochanteric bursitis, right hip: Secondary | ICD-10-CM

## 2022-01-02 DIAGNOSIS — Z8719 Personal history of other diseases of the digestive system: Secondary | ICD-10-CM

## 2022-01-02 DIAGNOSIS — H209 Unspecified iridocyclitis: Secondary | ICD-10-CM | POA: Diagnosis not present

## 2022-01-02 DIAGNOSIS — M19071 Primary osteoarthritis, right ankle and foot: Secondary | ICD-10-CM

## 2022-01-02 DIAGNOSIS — Z8739 Personal history of other diseases of the musculoskeletal system and connective tissue: Secondary | ICD-10-CM

## 2022-01-02 DIAGNOSIS — Z8669 Personal history of other diseases of the nervous system and sense organs: Secondary | ICD-10-CM

## 2022-01-02 DIAGNOSIS — Z8639 Personal history of other endocrine, nutritional and metabolic disease: Secondary | ICD-10-CM

## 2022-01-02 DIAGNOSIS — M19072 Primary osteoarthritis, left ankle and foot: Secondary | ICD-10-CM

## 2022-01-02 NOTE — Telephone Encounter (Addendum)
Patient's case re-opened in [facility] portal. Pre-certification form printed to be completed and faxed to [facility].  [place] Saliva, PharmD, MPH, BCPS, CPP Clinical Pharmacist (Rheumatology and Pulmonology)  ----- Message from [place] sent at 01/02/2022  8:21 AM EDT ----- Patient will be re-starting in office Cimzia, per Hazel Sams, PA-C. Please apply for medication.    Consent was obtained and sent to the scan center. Thanks!

## 2022-01-02 NOTE — Telephone Encounter (Addendum)
Error

## 2022-01-02 NOTE — Patient Instructions (Signed)
Standing Labs We placed an order today for your standing lab work.   Please have your standing labs drawn in 1 month then every 3 months   If possible, please have your labs drawn 2 weeks prior to your appointment so that the provider can discuss your results at your appointment.  Please note that you may see your imaging and lab results in [facility] before we have reviewed them. We may be awaiting multiple results to interpret others before contacting you. Please allow our office up to 72 hours to thoroughly review all of the results before contacting the office for clarification of your results.  We currently have open lab daily: Monday through Thursday from 1:30 PM-4:30 PM and Friday from 1:30 PM- 4:00 PM If possible, please come for your lab work on Monday, Thursday or Friday afternoons, as you may experience shorter wait times.   Effective March 18, 2022 the new lab hours will change to: Monday through Thursday from 1:30 PM-5:00 PM and Friday from 8:30 AM-12:00 PM If possible, please come for your lab work on Monday and Thursday afternoons, as you may experience shorter wait times.  Please be advised, all patients with office appointments requiring lab work will take precedent over walk-in lab work.    The office is located at [address] [place], [place], [place] [address] No appointment is necessary.   Labs are drawn by [facility]. Please bring your co-pay at the time of your lab draw.  You may receive a bill from [facility] for your lab work.  Please note if you are on Hydroxychloroquine and and an order has been placed for a Hydroxychloroquine level, you will need to have it drawn 4 hours or more after your last dose.  If you wish to have your labs drawn at another location, please call the office 24 hours in advance to send orders.  If you have any questions regarding directions or hours of operation,  please call [phone].   As a reminder, please drink plenty of water prior  to coming for your lab work. Thanks!   Certolizumab Pegol Injection What is this medication? CERTOLIZUMAB (SER toe LIZ oo mab) treats autoimmune conditions, such as psoriasis, arthritis, and Crohn's disease. It works by slowing down an overactive immune system. It belongs to a group of medications called TNF inhibitors. It is a monoclonal antibody. This medicine may be used for other purposes; ask your health care provider or pharmacist if you have questions. COMMON BRAND NAME(S): Cimzia What should I tell my care team before I take this medication? They need to know if you have any of these conditions: Cancer Diabetes Guillain-Barre syndrome Heart failure Hepatitis B or history of hepatitis B infection Immune system problems Infection or history of infections Low blood counts, such as low white cell, platelet, or red cell counts Multiple sclerosis Recently received or scheduled to receive a vaccine Tuberculosis, a positive skin test for tuberculosis, or recent close contact with someone who has tuberculosis An unusual or allergic reaction to certolizumab, other medications, latex, rubber, foods, dyes, or preservatives Pregnant or trying to get pregnant Breast-feeding How should I use this medication? This medication is injected under the skin. It is usually given by your care team in a hospital or clinic setting. It may also be given at home. If you get this medication at home, you will be taught how to prepare and give it. Use exactly as directed. Take it as directed on the prescription label at the  same time every day. Keep taking it unless your care team tells you to stop. It is important that you put your used needles and syringes in a special sharps container. Do not put them in a trash can. If you do not have a sharps container, call your pharmacist or care team to get one. A special MedGuide will be given to you by the pharmacist with each prescription and refill. Be sure to read  this information carefully each time. Talk to your care team about the use of this medication in children. Special care may be needed. Overdosage: If you think you have taken too much of this medicine contact a poison control center or emergency room at once. NOTE: This medicine is only for you. Do not share this medicine with others. What if I miss a dose? If you get this medication at a hospital or clinic: It is important not to miss your dose. Call your care team if you are unable to keep an appointment. If you give yourself this medication at home: If you miss a dose, take it as soon as you can. If it is almost time for your next dose, take only that dose. Do not take double or extra doses. Call your care team with questions. What may interact with this medication? Do not take this medication with any of the following: Biologic medications, such as abatacept, adalimumab, anakinra, etanercept, golimumab, infliximab, natalizumab, rituximab, secukinumab, tocilizumab, ustekinumab Live vaccines Tofacitinib This list may not describe all possible interactions. Give your health care provider a list of all the medicines, herbs, non-prescription drugs, or dietary supplements you use. Also tell them if you smoke, drink alcohol, or use illegal drugs. Some items may interact with your medicine. What should I watch for while using this medication? Visit your care team for regular checks on your progress. Tell your care team if your symptoms do not start to get better or if they get worse. Your condition will be monitored carefully while you are receiving this medication. You will be tested for tuberculosis (TB) before you start this medication. If your care team prescribes any medication for TB, you should start taking the TB medication before starting this medication. Make sure to finish the full course of TB medication. This medication may increase your risk of getting an infection. Call your care team for  advice if you get a fever, chills, sore throat, or other symptoms of a cold or flu. Do not treat yourself. Try to avoid being around people who are sick. Talk to your care team about your risk of cancer. You may be more at risk for certain types of cancers if you take this medication. What side effects may I notice from receiving this medication? Side effects that you should report to your care team as soon as possible: Allergic reactions--skin rash, itching, hives, swelling of the face, lips, tongue, or throat Aplastic anemia--unusual weakness or fatigue, dizziness, headache, trouble breathing, increased bleeding or bruising Body pain, tingling, or numbness Heart failure--shortness of breath, swelling of the ankles, feet, or hands, sudden weight gain, unusual weakness or fatigue Infection--fever, chills, cough, sore throat, wounds that don't heal, pain or trouble when passing urine, general feeling of discomfort or being unwell Lupus-like syndrome--joint pain, swelling, or stiffness, butterfly-shaped rash on the face, rashes that get worse in the sun, fever, unusual weakness or fatigue Seizures Unusual bruising or bleeding Side effects that usually do not require medical attention (report to your care team if they  continue or are bothersome): Back pain Cough Fatigue Fever Headache Pain, redness, or irritation at injection site Sore throat This list may not describe all possible side effects. Call your doctor for medical advice about side effects. You may report side effects to FDA at 1-800-FDA-1088. Where should I keep my medication? Keep out of the reach of children and pets. Store in the refrigerator. Do not freeze. Keep this medication in the original packaging until you are ready to take it. Protect from light. Get rid of any unused medication after the expiration date. To get rid of medications that are no longer needed or have expired: Take the medication to a medication take-back  program. Check with your pharmacy or law enforcement to find a location. If you cannot return the medication, ask your pharmacist or care team how to get rid of this medication safely. NOTE: This sheet is a summary. It may not cover all possible information. If you have questions about this medicine, talk to your doctor, pharmacist, or health care provider.  2023 Elsevier/Gold Standard (2021-07-17 00:00:00)

## 2022-01-03 LAB — CBC WITH DIFFERENTIAL/PLATELET
Absolute Monocytes: 525 cells/uL (ref 200–950)
Basophils Absolute: 20 cells/uL (ref 0–200)
Basophils Relative: 0.6 %
Eosinophils Absolute: 50 cells/uL (ref 15–500)
Eosinophils Relative: 1.5 %
HCT: 40.7 % (ref 35.0–45.0)
Hemoglobin: 13.1 g/dL (ref 11.7–15.5)
Lymphs Abs: 1000 cells/uL (ref 850–3900)
MCH: 28.7 pg (ref 27.0–33.0)
MCHC: 32.2 g/dL (ref 32.0–36.0)
MCV: 89.1 fL (ref 80.0–100.0)
MPV: 12.7 fL — ABNORMAL HIGH (ref 7.5–12.5)
Monocytes Relative: 15.9 %
Neutro Abs: 1706 cells/uL (ref 1500–7800)
Neutrophils Relative %: 51.7 %
Platelets: 128 10*3/uL — ABNORMAL LOW (ref 140–400)
RBC: 4.57 10*6/uL (ref 3.80–5.10)
RDW: 12.6 % (ref 11.0–15.0)
Total Lymphocyte: 30.3 %
WBC: 3.3 10*3/uL — ABNORMAL LOW (ref 3.8–10.8)

## 2022-01-03 LAB — COMPLETE METABOLIC PANEL WITH GFR
AG Ratio: 1.3 (calc) (ref 1.0–2.5)
ALT: 14 U/L (ref 6–29)
AST: 20 U/L (ref 10–35)
Albumin: 4.3 g/dL (ref 3.6–5.1)
Alkaline phosphatase (APISO): 69 U/L (ref 37–153)
BUN: 12 mg/dL (ref 7–25)
CO2: 27 mmol/L (ref 20–32)
Calcium: 10.1 mg/dL (ref 8.6–10.4)
Chloride: 107 mmol/L (ref 98–110)
Creat: 0.9 mg/dL (ref 0.50–1.03)
Globulin: 3.3 g/dL (calc) (ref 1.9–3.7)
Glucose, Bld: 83 mg/dL (ref 65–99)
Potassium: 4.3 mmol/L (ref 3.5–5.3)
Sodium: 142 mmol/L (ref 135–146)
Total Bilirubin: 0.8 mg/dL (ref 0.2–1.2)
Total Protein: 7.6 g/dL (ref 6.1–8.1)
eGFR: 74 mL/min/{1.73_m2} (ref >=60)

## 2022-01-05 ENCOUNTER — Other Ambulatory Visit: Payer: Self-pay | Admitting: *Deleted

## 2022-01-05 DIAGNOSIS — M0579 Rheumatoid arthritis with rheumatoid factor of multiple sites without organ or systems involvement: Secondary | ICD-10-CM

## 2022-01-05 DIAGNOSIS — H209 Unspecified iridocyclitis: Secondary | ICD-10-CM

## 2022-01-05 DIAGNOSIS — Z79899 Other long term (current) drug therapy: Secondary | ICD-10-CM

## 2022-01-05 NOTE — Telephone Encounter (Signed)
Cimzia pre-certification form completed and faxed with clinicals to [facility].  Fax: [phone] Phone: [phone]  Knox Saliva, PharmD, MPH, BCPS, CPP Clinical Pharmacist (Rheumatology and Pulmonology)

## 2022-01-05 NOTE — Progress Notes (Signed)
CMP WNL.  WBC count is low-3.3. platelet count is borderline low.  She is not currently taking any immunosuppressive agents.  She will be restarting in-office cimzia with close lab monitoring.   Recheck CBC with diff in 1 month.

## 2022-01-20 ENCOUNTER — Other Ambulatory Visit: Payer: Self-pay

## 2022-01-20 ENCOUNTER — Telehealth: Payer: Self-pay | Admitting: Internal Medicine

## 2022-01-20 ENCOUNTER — Emergency Department (HOSPITAL_COMMUNITY)
Admission: EM | Admit: 2022-01-20 | Discharge: 2022-01-20 | Disposition: A | Discharge status: Home / Self Care | Payer: Managed Care, Other (non HMO) | Attending: Emergency Medicine | Admitting: Emergency Medicine

## 2022-01-20 DIAGNOSIS — R001 Bradycardia, unspecified: Secondary | ICD-10-CM | POA: Insufficient documentation

## 2022-01-20 DIAGNOSIS — R110 Nausea: Secondary | ICD-10-CM | POA: Insufficient documentation

## 2022-01-20 DIAGNOSIS — H9201 Otalgia, right ear: Secondary | ICD-10-CM | POA: Diagnosis not present

## 2022-01-20 DIAGNOSIS — R42 Dizziness and giddiness: Secondary | ICD-10-CM | POA: Insufficient documentation

## 2022-01-20 LAB — BASIC METABOLIC PANEL
Anion gap: 7 (ref 5–15)
BUN: 11 mg/dL (ref 6–20)
CO2: 26 mmol/L (ref 22–32)
Calcium: 10 mg/dL (ref 8.9–10.3)
Chloride: 108 mmol/L (ref 98–111)
Creatinine, Ser: 0.82 mg/dL (ref 0.44–1.00)
GFR, Estimated: >60 mL/min (ref >60)
Glucose, Bld: 93 mg/dL (ref 70–99)
Potassium: 3.9 mmol/L (ref 3.5–5.1)
Sodium: 141 mmol/L (ref 135–145)

## 2022-01-20 LAB — CBC
HCT: 39.8 % (ref 36.0–46.0)
Hemoglobin: 12.7 g/dL (ref 12.0–15.0)
MCH: 28.5 pg (ref 26.0–34.0)
MCHC: 31.9 g/dL (ref 30.0–36.0)
MCV: 89.4 fL (ref 80.0–100.0)
Platelets: 152 10*3/uL (ref 150–400)
RBC: 4.45 MIL/uL (ref 3.87–5.11)
RDW: 12.6 % (ref 11.5–15.5)
WBC: 5.4 10*3/uL (ref 4.0–10.5)
nRBC: 0 % (ref 0.0–0.2)

## 2022-01-20 LAB — TROPONIN I (HIGH SENSITIVITY)
Troponin I (High Sensitivity): 7 ng/L (ref <18)
Troponin I (High Sensitivity): 7 ng/L (ref <18)

## 2022-01-20 MED ORDER — SODIUM CHLORIDE 0.9 % IV BOLUS
1000.0000 mL | Freq: Once | INTRAVENOUS | Status: AC
Start: 2022-01-20 — End: 2022-01-20
  Administered 2022-01-20: 1000 mL via INTRAVENOUS

## 2022-01-20 MED ORDER — MECLIZINE HCL 12.5 MG PO TABS: 12.5000 mg | ORAL_TABLET | Freq: Three times a day (TID) | ORAL | 0 refills | Status: AC | PRN

## 2022-01-20 MED ORDER — MECLIZINE HCL 25 MG PO TABS
25.0000 mg | ORAL_TABLET | Freq: Once | ORAL | Status: AC
  Administered 2022-01-20: 25 mg via ORAL
  Filled 2022-01-20: qty 1

## 2022-01-20 NOTE — Telephone Encounter (Signed)
Pt at ED.

## 2022-01-20 NOTE — Discharge Instructions (Addendum)
For your parotid swelling, I recommend being well hydrated, applying warm compresses to the area. You can also suck on hard candy that will promote salivary flow. You can use 600 mg Ibuprofen for pain and inflammation. No antibiotics are indicated at this time.  Please follow up with your PCP for further management. I have also given you a referral for an ENT doctor to discuss your symptoms.  Please call to schedule an appointment.

## 2022-01-20 NOTE — Telephone Encounter (Signed)
Nurse Assessment Nurse: Vallery Sa, RN, Cathy Date/Time (Eastern Time): 01/20/2022 8:38:42 AM Confirm and document reason for call. If symptomatic, describe symptoms. ---Caller states she developed dizziness and her blood pressure 174/98 while standing this morning. No chest pain or breathing difficulty. No injury in the past 3 days. Alert and responsive. Does the patient have any new or worsening symptoms? ---Yes Will a triage be completed? ---Yes Related visit to physician within the last 2 weeks? ---No Does the PT have any chronic conditions? (i.e. diabetes, asthma, this includes High risk factors for pregnancy, etc.) ---Yes List chronic conditions. ---RA, Bradycardia, High Blood Pressure Is this a behavioral health or substance abuse call? ---No Guidelines Guideline Title Affirmed Question Affirmed Notes Nurse Date/Time (Eastern Time) Blood Pressure - High [7] Systolic BP >= 341 OR Diastolic >= 937 AND [9] cardiac (e.g., breathing difficulty, chest pain) or neurologic symptoms (e.g., new-onset blurred Vallery Sa, RN, Tye [place] 01/20/2022 8:40:24 AM PLEASE NOTE: All timestamps contained within this report are represented as [place] Standard Time. CONFIDENTIALTY NOTICE: This fax transmission is intended only for the addressee. It contains information that is legally privileged, confidential or otherwise protected from use or disclosure. If you are not the intended recipient, you are strictly prohibited from reviewing, disclosing, copying using or disseminating any of this information or taking any action in reliance on or regarding this information. If you have received this fax in error, please notify us immediately by telephone so that we can arrange for its return to [place]. Phone: [phone], Toll-Free: [phone], Fax: [phone] Page: 2 of 2 Call Id: [id number] Guidelines Guideline Title Affirmed Question Affirmed Notes Nurse Date/Time Eilene Ghazi Time) or double vision, unsteady  gait) Disp. Time Eilene Ghazi Time) Disposition Final User 01/20/2022 8:42:15 AM Go to ED Now Yes Vallery Sa, RN, Tye [place] Final Disposition 01/20/2022 8:42:15 AM Go to ED Now Yes Vallery Sa, RN, Rosey Bath Disagree/Comply Comply Caller Understands Yes PreDisposition Go to ED Care Advice Given Per Guideline GO TO ED NOW: * You need to be seen in the Emergency Department. * Go to the ED at ___________ [place] now. Drive carefully. NOTE TO TRIAGER - DRIVING: * Another adult should drive. * Patient should not delay going to the emergency department. * If immediate transportation is not available via car, rideshare (e.g., Lyft, Uber), or taxi, then the patient should be instructed to call EMS-911. CALL EMS 911 IF: * Passes out or faints * Becomes confused * Becomes too weak to stand * You become worse CARE ADVICE given per High Blood Pressure (Adult) guideline. Referrals [facility] - ED

## 2022-01-20 NOTE — ED Notes (Signed)
Pt ambulated to the bathroom independently.

## 2022-01-20 NOTE — ED Triage Notes (Signed)
Pt. Stated, Ive had some dizziness with rt. Ear pain started this morning

## 2022-01-20 NOTE — Telephone Encounter (Signed)
Pt stated she is feeling very lightheaded and nauseous and wanted an appt with pcp. Bp sitting down was 159/89 and standing was 174/98. Transferred to triage for nurse eval.

## 2022-01-20 NOTE — ED Provider Triage Note (Signed)
Emergency Medicine Provider Triage Evaluation Note  Brittany Hodges , a 56 y.o. female  was evaluated in triage.  Pt complains of dizziness and nausea starting this morning.  Patient has a history of heart surgery for ASD repair, rheumatoid arthritis, hypertension, bradycardia.  States that she went to work today and had her blood pressure checked and it was elevated, up to the 093 systolic range.  Denies shortness of breath.  Dizziness is described as an off-balance sensation.  She does not really feel like she is going to pass out.  No lower extremity swelling.  Denies any active bleeding.  Review of Systems  Positive: Nausea, dizziness, imbalance Negative: Shortness of breath  Physical Exam  BP (!) 164/79 (BP Location: Right Arm)   Pulse (!) 48   Temp 98.2 F (36.8 C) (Oral)   Resp 16   LMP 05/01/2016   SpO2 100%  Gen:   Awake, no distress   Resp:  Normal effort  MSK:   Moves extremities without difficulty  Other:  Healed surgical scar on upper chest, regular rhythm, bradycardic  Medical Decision Making  Medically screening exam initiated at 10:26 AM.  Appropriate orders placed.  [name] was informed that the remainder of the evaluation will be completed by another provider, this initial triage assessment does not replace that evaluation, and the importance of remaining in the ED until their evaluation is complete.     Carlisle Cater, PA-C 01/20/22 1027

## 2022-01-20 NOTE — ED Provider Notes (Signed)
[place] EMERGENCY DEPARTMENT Provider Note   CSN: [id number] Arrival date & time: 01/20/22  4193     History PMH:  HTN, hypothyroidism, SA node dysfunction, Atrial Septal Defect s/p repair in 2012, RA, IBS, Anemia, Asthma Chief Complaint  Patient presents with   Dizziness   Otalgia    Brittany Hodges is a 56 y.o. female. Patient presents with a chief complaint of dizziness.  She states this morning when she woke up and went to a standing position she started feeling lightheaded and associated nausea.  She says she felt this while she was walking and felt a little bit off balance.  She states that it gets better when she sits down or lies flat.  She went to her PCP today who was concerned since she has a history of SA node dysfunction and has chronic bradycardia, she was sent to the emergency department.  She also states that she has some right ear pain.  She has been having problems with her right ear intermittently for several months.  She has been called that she has swelling of her parotid gland.  She has been to be seeing ENT for this but has not been able to be referred yet.  She denies chest pain, shortness of breath, abdominal pain, vomiting, diarrhea, fevers, chills, leg swelling, numbness, tingling, weakness, speech change, visual change.     Dizziness Associated symptoms: nausea   Associated symptoms: no chest pain, no diarrhea, no headaches, no palpitations, no shortness of breath, no vomiting and no weakness   Otalgia Associated symptoms: no abdominal pain, no congestion, no cough, no diarrhea, no ear discharge, no fever, no headaches, no sore throat and no vomiting        Home Medications Prior to Admission medications   Medication Sig Start Date End Date Taking? Authorizing Provider  meclizine (ANTIVERT) 12.5 MG tablet Take 1 tablet (12.5 mg total) by mouth 3 (three) times daily as needed for dizziness. 01/20/22  Yes Preet Mangano, Adora Fridge, PA-C   amLODipine (NORVASC) 2.5 MG tablet TAKE 1 TABLET BY MOUTH EVERY DAY 09/22/21   Colon Branch, MD  BLACK CURRANT SEED OIL PO Take by mouth daily.    [provider]  Carboxymethylcellulose Sodium (EYE DROPS OP) Apply to eye daily.    [provider]  prednisoLONE acetate (PRED FORTE) 1 % ophthalmic suspension Place 1 drop into both eyes daily. 06/01/18   [provider]  predniSONE (DELTASONE) 20 MG tablet Take 1 tablet (20 mg total) by mouth daily with breakfast. Patient not taking: Reported on 01/02/2022 10/24/21   Marrian Salvage, FNP  tobramycin (TOBREX) 0.3 % ophthalmic solution Place 1 drop into both eyes every 6 (six) hours. Patient not taking: Reported on 01/02/2022 10/24/21   Marrian Salvage, FNP      Allergies    Etanercept, Methotrexate derivatives, and Pregabalin    Review of Systems   Review of Systems  Constitutional:  Negative for chills and fever.  HENT:  Positive for ear pain. Negative for congestion, ear discharge, sore throat and trouble swallowing.   Eyes:  Negative for photophobia and visual disturbance.  Respiratory:  Negative for cough, shortness of breath and wheezing.   Cardiovascular:  Negative for chest pain, palpitations and leg swelling.  Gastrointestinal:  Positive for nausea. Negative for abdominal pain, diarrhea and vomiting.  Genitourinary:  Negative for dysuria, flank pain and hematuria.  Musculoskeletal:  Negative for back pain.  Skin:  Negative for wound.  Neurological:  Positive for dizziness and light-headedness. Negative for tremors, seizures, syncope, facial asymmetry, speech difficulty, weakness, numbness and headaches.  All other systems reviewed and are negative.   Physical Exam Updated Vital Signs BP (!) 149/88 (BP Location: Left Arm)   Pulse (!) 53   Temp 97.8 F (36.6 C) (Oral)   Resp 16   Ht '5\' 9"'$  (1.753 m)   Wt 59 kg   LMP 05/01/2016   SpO2 97%   BMI 19.20 kg/m  Physical Exam Vitals and nursing  note reviewed.  Constitutional:      General: She is not in acute distress.    Appearance: Normal appearance. She is not ill-appearing, toxic-appearing or diaphoretic.  HENT:     Head: Normocephalic and atraumatic.     Right Ear: Tympanic membrane, ear canal and external ear normal. There is no impacted cerumen.     Left Ear: Tympanic membrane, ear canal and external ear normal. There is no impacted cerumen.     Ears:     Comments: Minimal erythema to the external ear canal in the left ear. Mild swelling and tenderness overlying the parotid gland.    Nose: No nasal deformity, congestion or rhinorrhea.     Mouth/Throat:     Lips: Pink. No lesions.     Mouth: Mucous membranes are moist. No injury, lacerations, oral lesions or angioedema.     Pharynx: Oropharynx is clear. Uvula midline. No pharyngeal swelling, oropharyngeal exudate, posterior oropharyngeal erythema or uvula swelling.     Comments: Dentition unremarkable. No Tonsillar exudate or swelling. Uvula midline  Eyes:     General: Gaze aligned appropriately. No scleral icterus.       Right eye: No discharge.        Left eye: No discharge.     Extraocular Movements: Extraocular movements intact.     Conjunctiva/sclera: Conjunctivae normal.     Right eye: Right conjunctiva is not injected. No exudate or hemorrhage.    Left eye: Left conjunctiva is not injected. No exudate or hemorrhage.    Pupils: Pupils are equal, round, and reactive to light.  Cardiovascular:     Rate and Rhythm: Normal rate and regular rhythm.     Pulses: Normal pulses.          Radial pulses are 2+ on the right side and 2+ on the left side.       Dorsalis pedis pulses are 2+ on the right side and 2+ on the left side.     Heart sounds: Normal heart sounds, S1 normal and S2 normal. Heart sounds not distant. No murmur heard.    No friction rub. No gallop. No S3 or S4 sounds.  Pulmonary:     Effort: Pulmonary effort is normal. No accessory muscle usage or  respiratory distress.     Breath sounds: Normal breath sounds. No stridor. No wheezing, rhonchi or rales.  Abdominal:     General: Abdomen is flat. There is no distension.     Palpations: Abdomen is soft. There is no mass or pulsatile mass.     Tenderness: There is no abdominal tenderness. There is no right CVA tenderness, left CVA tenderness, guarding or rebound.     Hernia: No hernia is present.  Musculoskeletal:     Right lower leg: No edema.     Left lower leg: No edema.  Lymphadenopathy:     Cervical: No cervical adenopathy.  Skin:    General: Skin is warm and dry.  Coloration: Skin is not jaundiced or pale.     Findings: No bruising, erythema, lesion or rash.  Neurological:     General: No focal deficit present.     Mental Status: She is alert and oriented to person, place, and time.     GCS: GCS eye subscore is 4. GCS verbal subscore is 5. GCS motor subscore is 6.     Comments: Alert and Oriented x 3 Speech clear with no aphasia Cranial Nerve testing - Visual Fields grossly intact - PERRLA. EOM intact. No Nystagmus - Facial Sensation grossly intact - No facial asymmetry - Uvula and Tongue Midline - Accessory Muscles intact Motor: - 5/5 motor strength in all four extremities.  Sensation: - Grossly intact in all four extremities.  Coordination:  - Finger to nose and heel to shin intact bilaterally   Psychiatric:        Mood and Affect: Mood normal.        Behavior: Behavior normal. Behavior is cooperative.     ED Results / Procedures / Treatments   Labs (all labs ordered are listed, but only abnormal results are displayed) Labs Reviewed  BASIC METABOLIC PANEL  CBC  TROPONIN I (HIGH SENSITIVITY)  TROPONIN I (HIGH SENSITIVITY)    EKG EKG Interpretation  Date/Time:  Tuesday January 20 2022 10:16:49 EDT Ventricular Rate:  49 PR Interval:  142 QRS Duration: 80 QT Interval:  418 QTC Calculation: 377 R Axis:   85 Text Interpretation: Sinus bradycardia  Cannot rule out Anterior infarct , age undetermined Abnormal ECG When compared with ECG of 16-Sep-2020 20:36, PREVIOUS ECG IS PRESENT Confirmed by Wandra Arthurs [id number]) on 01/20/2022 5:14:51 PM  Radiology No results found.  Procedures Procedures  This patient was on telemetry or cardiac monitoring during their time in the ED.    Medications Ordered in ED Medications  sodium chloride 0.9 % bolus 1,000 mL (0 mLs Intravenous Stopped 01/20/22 1828)  meclizine (ANTIVERT) tablet 25 mg (25 mg Oral Given 01/20/22 1542)    ED Course/ Medical Decision Making/ A&P                           Medical Decision Making   MDM  This is a 56 y.o. female who presents to the ED with dizziness The differential of this patient includes but is not limited to BPPV, Labyrinthitis, Meniere's, Posterior Circulating Stroke, Vertebral Artery Dissection, Parotitis, Symptomatic Bradycardia  Initial Impression  Patient is well-appearing in no acute distress.  She was initially bradycardic, however heart rate right now is 79.  Blood pressure is stable and she is afebrile.  She is completely asymptomatic right now. I have lower suspicion for central pathology.  Symptoms seem more concerning for orthostasis versus a vertigo 2/2 to ear problem.  Chart review reveals fairly consistent bradycardia so I also have lower suspicion for her being symptomatic from this. Giving 1 L IVF, checking orthostatics, Meclizine trial, will ambulate and reassess  I personally ordered, reviewed, and interpreted all laboratory work and imaging and agree with radiologist interpretation. Results interpreted below: CBC without anemia or leukocytosis. BMP without electrolyte abnormalities, renal function normal, not acidotic, troponin negative x 2. EKG was read and interpreted and she does have SB, but no heart block or arrhythmia is identified.   Assessment/Plan:  Patient is here with lightheadedness that is most frequent when she is standing up.   She has a completely normal neurological exam and is thought that  this is not a central pathology.  Her labs are completely normal as well as having a normal EKG.  She does have sinus bradycardia, however this has been a longstanding heart rate for her and is not thought to be the cause of her symptoms.  She was given meclizine as well as IV fluids here.  We have ambulated her and she has not had recurrence of symptoms.  I think it is likely an orthostatic etiology of her symptoms. I considered admission, but given that she has no true neurological deficits and is not symptomatic, I do not feel that this is indicated. She should follow up with her PCP.  She does have some inflammation of her parotid gland on the right, which could be secondary to a salivary stone. Doubt bacterial etiology without any other systemic symptoms.  I have referred her to ENT for this.    Charting Requirements Additional history is obtained from:  Independent historian External Records from outside source obtained and reviewed including: telephone note from today referring her to the ED Social Determinants of Health:  none Pertinant PMH that complicates patient's illness: SA node dysfunction  Patient Care Problems that were addressed during this visit: - Dizziness: Acute illness with systemic symptoms This patient was maintained on a cardiac monitor/telemetry. I personally viewed and interpreted the cardiac monitor which reveals an underlying rhythm of SB Medications given in ED: IVF, Meclizine Reevaluation of the patient after these medicines showed that the patient resolved I have reviewed home medications and made changes accordingly.  Critical Care Interventions: n/a Consultations: n/a Disposition: discharge  This is a supervised visit with my attending physician, Dr. Darl Householder. We have discussed this patient and they have altered the plan as needed.  Portions of this note were generated with Theatre manager. Dictation errors may occur despite best attempts at proofreading.  Final Clinical Impression(s) / ED Diagnoses Final diagnoses:  Dizziness    Rx / DC Orders ED Discharge Orders          Ordered    meclizine (ANTIVERT) 12.5 MG tablet  3 times daily PRN        01/20/22 1743              Margarine Grosshans, Adora Fridge, PA-C 01/20/22 1903    Drenda Freeze, MD 01/20/22 2337

## 2022-01-27 ENCOUNTER — Ambulatory Visit: Payer: Managed Care, Other (non HMO) | Attending: Internal Medicine

## 2022-01-27 ENCOUNTER — Encounter: Payer: Self-pay | Admitting: Internal Medicine

## 2022-01-27 ENCOUNTER — Ambulatory Visit (INDEPENDENT_AMBULATORY_CARE_PROVIDER_SITE_OTHER): Payer: Managed Care, Other (non HMO) | Admitting: Internal Medicine

## 2022-01-27 VITALS — BP 118/68 | HR 42 | Temp 98.3°F | Resp 18 | Ht 68.5 in | Wt 132.1 lb

## 2022-01-27 DIAGNOSIS — R001 Bradycardia, unspecified: Secondary | ICD-10-CM

## 2022-01-27 DIAGNOSIS — R42 Dizziness and giddiness: Secondary | ICD-10-CM

## 2022-01-27 DIAGNOSIS — M8580 Other specified disorders of bone density and structure, unspecified site: Secondary | ICD-10-CM | POA: Insufficient documentation

## 2022-01-27 NOTE — Assessment & Plan Note (Addendum)
Dizziness: As described above, symptoms lasted 24 hours, she is feeling better, work-up in the ER negative.  She does have a history of bradycardia, heart rate in the 40s typically.  Plan: Get a Holter monitor to rule out deeper bradycardia causing dizziness.  See AVS. Rheumatoid arthritis: Last visit with rheumatology 01/02/2022, at the time she was not taking any NSAIDs or immunosuppressive agents.  Trying to get on Cimzia again. Recurrent iritis: Sees Dr. Sabra Heck Osteopenia: DEXA 10/28/2020, T score -1.1 ,see report RTC CPX 06/2022

## 2022-01-27 NOTE — Progress Notes (Unsigned)
Enrolled for [employer] to mail a ZIO XT long term holter monitor to the patients address on file.   DOD to read. 

## 2022-01-27 NOTE — Progress Notes (Addendum)
Subjective:    Patient ID: Brittany Hodges, female    DOB: 01-01-1966, 56 y.o.   MRN: [id number]  DOS:  01/27/2022 Type of visit - description: ER f/u  Went to the ER ER 01/20/2022, she reported dizziness described as being lightheaded when standing up. No syncope, no spinning. No chest pain or difficulty breathing. No headache Did have some nausea but no vomiting.  Work-up in the ER: Troponin, BMP, CBC, essentially normal. EKG: Sinus bradycardia with no acute changes.  Wt Readings from Last 3 Encounters:  01/27/22 132 lb 2 oz (59.9 kg)  01/20/22 130 lb (59 kg)  01/02/22 132 lb 3.2 oz (60 kg)    Review of Systems See above   Past Medical History:  Diagnosis Date   Allergic rhinitis, cause unspecified    Anemia    Asthma    Atrial septal defect    s/p repair;   Echo 08/07/10: EF 55-60%; mild MR; atrial septal aneurysm; no residual ASD   Chest pain, unspecified    cardiac cath 07/31/10: normal coronaries; vigorous LVF   Esophageal reflux    Fibromyalgia    Headache(784.0)    Hematuria, unspecified    Hypertension    no meds    Hypothyroidism    Irritable bowel syndrome    Low back pain    Rheumatoid arthritis(714.0)    Dr Herold Harms   Scoliosis    Sinoatrial node dysfunction (HCC)    eval. for chronotropic competence completed in past    Past Surgical History:  Procedure Laterality Date   ASD REPAIR  1985   TONSILLECTOMY AND ADENOIDECTOMY     TUBAL LIGATION      Current Outpatient Medications  Medication Instructions   amLODipine (NORVASC) 2.5 MG tablet TAKE 1 TABLET BY MOUTH EVERY DAY   BLACK CURRANT SEED OIL PO Oral, Daily   Carboxymethylcellulose Sodium (EYE DROPS OP) Ophthalmic, Daily   meclizine (ANTIVERT) 12.5 mg, Oral, 3 times daily PRN   prednisoLONE acetate (PRED FORTE) 1 % ophthalmic suspension 1 drop, Both Eyes, Daily   predniSONE (DELTASONE) 20 mg, Oral, Daily with breakfast   tobramycin (TOBREX) 0.3 % ophthalmic solution 1 drop, Both Eyes,  Every 6 hours       Objective:   Physical Exam BP 118/68   Pulse (!) 42   Temp 98.3 F (36.8 C) (Oral)   Resp 18   Ht 5' 8.5" (1.74 m)   Wt 132 lb 2 oz (59.9 kg)   LMP 05/01/2016   SpO2 98%   BMI 19.80 kg/m  General:   Well developed, NAD, BMI noted. HEENT:  Normocephalic . Face symmetric, atraumatic Lungs:  CTA B Normal respiratory effort, no intercostal retractions, no accessory muscle use. Heart: bradychycardic .  Lower extremities: no pretibial edema bilaterally  Skin: Not pale. Not jaundice Neurologic:  alert & oriented X3.  Speech normal, gait appropriate for age and unassisted Psych--  Cognition and judgment appear intact.  Cooperative with normal attention span and concentration.  Behavior appropriate. No anxious or depressed appearing.      Assessment    Assessment   Prediabetes: A1c 6.0 (01-2017) HTN H/o Hypothyroidism (took meds at some point) GERD- dysphagia -Dr. Norville Haggard EGD from 04/02/2014 (Dr Delrae Alfred): Narrowing of proximal esophagus, question of extrinsic extrinsic compression, EUS showing no intrinsic esophageal mass but the spine was seen in close proximity  to the esophagus probably resulting in extrinsic compression. The endoscopist recommend repeat CT neck and chest in 3 months.saw ENT [phone]  for dysphagia,had a scope,  rx PPI Asthma, uses alb rarely  CV: --Atrial septal defect: Status post repair, no residual ASD: Needs ABX prophylaxis preprocedures --Sinoatrial  Dysfunction, sees Dr Caryl Comes   --CP: cath 2012, normal coronaries MSK: --Rheumatoid arthritis,   Dr. Herold Harms + rheumatoid factor +CCP,   h/o  low WBC with Methotrexate,   injection site reaction to Enbrel  , self d/c Humira ~ 12/2018 --Osteopenia: T score -1.7 (2020), -1.1 10/2020 IBS (Miralax prn constipation)   Admission: syncope 04-2016 (unlikely to be arrhythmia related per cards ) History of fibromyalgia   PLAN:  Dizziness: As described above, symptoms lasted 24 hours, she is  feeling better, work-up in the ER negative.  She does have a history of bradycardia, heart rate in the 40s typically.  Plan: Get a Holter monitor to rule out deeper bradycardia causing dizziness.  See AVS. Rheumatoid arthritis: Last visit with rheumatology 01/02/2022, at the time she was not taking any NSAIDs or immunosuppressive agents.  Trying to get on Cimzia again. Recurrent iritis: Sees Dr. Sabra Heck Osteopenia: DEXA 10/28/2020, T score -1.1 ,see report RTC CPX 06/2022

## 2022-01-27 NOTE — Patient Instructions (Addendum)
We are ordering a Holter monitor for you.  If you do on hear from them next week, please let me know.  Take vitamin D supplements: 2000 units daily  Check the  blood pressure regularly BP GOAL is between 110/65 and  135/85. If it is consistently higher or lower, let me know    [place], PLEASE SCHEDULE YOUR APPOINTMENTS Come back for a physical exam by 06-2022

## 2022-02-02 DIAGNOSIS — R001 Bradycardia, unspecified: Secondary | ICD-10-CM | POA: Diagnosis not present

## 2022-02-02 DIAGNOSIS — R42 Dizziness and giddiness: Secondary | ICD-10-CM | POA: Diagnosis not present

## 2022-02-04 NOTE — Telephone Encounter (Signed)
Called [employer] for update on patient's Cimzia authorization. Per rep, prior authorization is not required. Response was faxed to incorrect fax number. Rep will refax. Patient can be scheduled to restart Cimzia with loading dose.  Phone: [phone]  Patient scheduled to restart Cimzia tomorrow, 02/05/22. She confirms she has no ongoing infection, is not taking antibiotics, and does not have any upcoming procedures or surgeries.  Knox Saliva, PharmD, MPH, BCPS, CPP Clinical Pharmacist (Rheumatology and Pulmonology)

## 2022-02-05 ENCOUNTER — Ambulatory Visit: Payer: Managed Care, Other (non HMO) | Attending: Rheumatology | Admitting: Pharmacist

## 2022-02-05 DIAGNOSIS — Z79899 Other long term (current) drug therapy: Secondary | ICD-10-CM

## 2022-02-05 DIAGNOSIS — M0579 Rheumatoid arthritis with rheumatoid factor of multiple sites without organ or systems involvement: Secondary | ICD-10-CM | POA: Diagnosis not present

## 2022-02-05 DIAGNOSIS — H209 Unspecified iridocyclitis: Secondary | ICD-10-CM

## 2022-02-05 MED ORDER — CERTOLIZUMAB PEGOL 2 X 200 MG ~~LOC~~ KIT
400.0000 mg | PACK | Freq: Once | SUBCUTANEOUS | Status: AC
  Administered 2022-02-05: 400 mg via SUBCUTANEOUS

## 2022-02-05 NOTE — Progress Notes (Addendum)
Pharmacy Note  Subjective:   Patient presents to clinic today to restart in-office Cimzia for RA and iritis. She started Cimzia on 03/17/21. She received three total in-office injections (04/14/21 and 05/14/21). Then had recurrent sinus infections. She states she is willing to trial Cimzia for 6 months.  Patient running a fever or have signs/symptoms of infection? No  Patient currently on antibiotics for the treatment of infection? No  Patient have any upcoming invasive procedures/surgeries? No  Objective: CMP     Component Value Date/Time   NA 141 01/20/2022 1032   NA 144 04/08/2021 1216   K 3.9 01/20/2022 1032   CL 108 01/20/2022 1032   CO2 26 01/20/2022 1032   GLUCOSE 93 01/20/2022 1032   BUN 11 01/20/2022 1032   BUN 15 04/08/2021 1216   CREATININE 0.82 01/20/2022 1032   CREATININE 0.90 01/02/2022 0820   CALCIUM 10.0 01/20/2022 1032   PROT 7.6 01/02/2022 0820   PROT 7.9 04/08/2021 1216   ALBUMIN 4.5 04/08/2021 1216   AST 20 01/02/2022 0820   AST 21 02/18/2021 1452   ALT 14 01/02/2022 0820   ALT 13 02/18/2021 1452   ALKPHOS 74 04/08/2021 1216   BILITOT 0.8 01/02/2022 0820   BILITOT 0.3 04/08/2021 1216   BILITOT 0.4 02/18/2021 1452   GFRNONAA >60 01/20/2022 1032   GFRNONAA 53 (L) 02/18/2021 1452   GFRNONAA 77 02/15/2020 1023   GFRAA 89 02/15/2020 1023    CBC    Component Value Date/Time   WBC 5.4 01/20/2022 1032   RBC 4.45 01/20/2022 1032   HGB 12.7 01/20/2022 1032   HGB 12.3 04/08/2021 1216   HGB 11.2 (L) 07/26/2013 0802   HCT 39.8 01/20/2022 1032   HCT 39.3 04/08/2021 1216   HCT 35.5 07/26/2013 0802   PLT 152 01/20/2022 1032   PLT 140 (L) 04/08/2021 1216   MCV 89.4 01/20/2022 1032   MCV 90 04/08/2021 1216   MCV 86 07/26/2013 0802   MCH 28.5 01/20/2022 1032   MCHC 31.9 01/20/2022 1032   RDW 12.6 01/20/2022 1032   RDW 12.0 04/08/2021 1216   RDW 13.6 07/26/2013 0802   LYMPHSABS 1,000 01/02/2022 0820   LYMPHSABS 2.0 04/08/2021 1216   LYMPHSABS 0.9  07/26/2013 0802   MONOABS 0.5 02/18/2021 1452   EOSABS 50 01/02/2022 0820   EOSABS 0.0 04/08/2021 1216   EOSABS 0.0 07/26/2013 0802   BASOSABS 20 01/02/2022 0820   BASOSABS 0.0 04/08/2021 1216   BASOSABS 0.0 07/26/2013 0802    Baseline Immunosuppressant Therapy Labs TB GOLD    Latest Ref Rng & Units 01/31/2021    9:03 AM  Quantiferon TB Gold  Quantiferon TB Gold Plus NEGATIVE NEGATIVE    Hepatitis Panel    Latest Ref Rng & Units 06/23/2018    3:51 PM  Hepatitis  Hep B Surface Ag NON-REACTI NON-REACTIVE   Hep B IgM NON-REACTI NON-REACTIVE   Hep C Ab NON-REACTI NON-REACTIVE   Hep A IgM NON-REACTI NON-REACTIVE    HIV Lab Results  Component Value Date   HIV NONREACTIVE 12/21/2014   Immunoglobulins    Latest Ref Rng & Units 06/23/2018    3:51 PM  Immunoglobulin Electrophoresis  IgA  47 - 310 mg/dL 400   IgG 600 - 1,640 mg/dL 2,196   IgM 50 - 300 mg/dL 118    SPEP    Latest Ref Rng & Units 01/02/2022    8:20 AM  Serum Protein Electrophoresis  Total Protein 6.1 - 8.1 g/dL 7.6  G6PD No results found for: "G6PDH" TPMT No results found for: "TPMT"   Chest x-ray: 09/16/20-Stable exam without acute or active cardiopulmonary disease.  Assessment/Plan:   Patient tolerated injection without issue.   Administrations This Visit     certolizumab pegol (CIMZIA) kit 400 mg     Admin Date 02/05/2022 Action Given Dose 400 mg Route Subcutaneous Administered By Cassandria Anger, RPH-CPP           Patient monitored for 30 minutes. No injection site reaction noted yb provider. Patient denies irritation. Reports slight itchiness at right abdomen injection site but not significant. Reviewed injection site reaction management including icing area and OTC hydrocortisone or Benadryl cream.  She will continue Cimzia 469m SQ (as two divided injections) every 4 weeks which she will receive in the office. She will not be receiving loading dose of Cimzia due to recurrent low WBC.    Appointment for next injection scheduled to be received at office visit on 03/05/22.  Patient due for labs in 1 month then every 3 months.  Patient is to call and reschedule appointment if running a fever with signs/symptoms of infection, on antibiotics for active infection or has an upcoming invasive procedure  All questions encouraged and answered.  Instructed patient to call with any further questions or concerns.  DKnox Saliva PharmD, MPH, BCPS, CPP Clinical Pharmacist (Rheumatology and Pulmonology)

## 2022-02-16 ENCOUNTER — Other Ambulatory Visit: Payer: Self-pay | Admitting: Family Medicine

## 2022-02-19 ENCOUNTER — Telehealth: Payer: Self-pay | Admitting: Internal Medicine

## 2022-02-19 NOTE — Telephone Encounter (Signed)
Patient called to advise that she had an ultrasound at a place called [facility]" and she wanted to talk to Dr. Larose Kells nurse to discuss the results she got.

## 2022-02-20 NOTE — Telephone Encounter (Signed)
Tried calling Pt, no answer, voicemail box is not set up for me to leave a message.

## 2022-02-20 NOTE — Progress Notes (Deleted)
Office Visit Note  Patient: Brittany Hodges             Date of Birth: 12-19-1965           MRN: [id number]             PCP: Colon Branch, MD Referring: Colon Branch, MD Visit Date: 03/05/2022 Occupation: '@GUAROCC'$ @  Subjective:  No chief complaint on file.   History of Present Illness: Brittany Hodges is a 56 y.o. female ***   Activities of Daily Living:  Patient reports morning stiffness for *** {minute/hour:19697}.   Patient {ACTIONS;DENIES/REPORTS:21021675::"Denies"} nocturnal pain.  Difficulty dressing/grooming: {ACTIONS;DENIES/REPORTS:21021675::"Denies"} Difficulty climbing stairs: {ACTIONS;DENIES/REPORTS:21021675::"Denies"} Difficulty getting out of chair: {ACTIONS;DENIES/REPORTS:21021675::"Denies"} Difficulty using hands for taps, buttons, cutlery, and/or writing: {ACTIONS;DENIES/REPORTS:21021675::"Denies"}  No Rheumatology ROS completed.   PMFS History:  Patient Active Problem List   Diagnosis Date Noted   Osteopenia 01/27/2022   Bradycardia 03/06/2021   Benign paroxysmal positional vertigo 03/06/2021   Mild intermittent asthma 12/22/2020   Chronic sinusitis 09/14/2019   Intolerance, food 09/14/2019   Asthmatic bronchitis 04/06/2016   Laryngopharyngeal reflux (LPR) 04/06/2016   PCP NOTES >>>>> 03/09/2015   Annual physical exam 12/21/2014   Cardiomegaly, by MRI 03/16/2014   Leukopenia 03/10/2014   Dizziness 02/10/2011   SINOATRIAL NODE DYSFUNCTION 08/06/2008   ATRIAL SEPTAL DEFECT 06/29/2007   Hypothyroidism 03/21/2007   Essential hypertension 03/21/2007   Perennial allergic rhinitis 03/21/2007   GERD-- dysphagia 03/21/2007   CONSTIPATION 03/21/2007   IRRITABLE BOWEL SYNDROME 03/21/2007   RA (rheumatoid arthritis) ([place]) 03/21/2007   LOW BACK PAIN 03/21/2007   FIBROMYALGIA 03/21/2007    Past Medical History:  Diagnosis Date   Allergic rhinitis, cause unspecified    Anemia    Asthma    Atrial septal defect    s/p repair;   Echo 08/07/10: EF  55-60%; mild MR; atrial septal aneurysm; no residual ASD   Chest pain, unspecified    cardiac cath 07/31/10: normal coronaries; vigorous LVF   Esophageal reflux    Fibromyalgia    Headache(784.0)    Hematuria, unspecified    Hypertension    no meds    Hypothyroidism    Irritable bowel syndrome    Low back pain    Rheumatoid arthritis(714.0)    Dr Herold Harms   Scoliosis    Sinoatrial node dysfunction (HCC)    eval. for chronotropic competence completed in past    Family History  Problem Relation Age of Onset   Hypertension Mother    Diabetes Mother    Kidney failure Mother    Heart disease Maternal Grandmother    Stomach cancer Maternal Grandmother 76   Rheum arthritis Maternal Grandmother    Glaucoma Father    Breast cancer Other        aunt   Diabetes Other        GM   Hypertension Sister    Thyroid disease Sister    Hypertension Sister    Thyroid disease Sister    Asthma Son    Eczema Son    Sinusitis Daughter    Colon cancer Neg Hx    Allergic rhinitis Neg Hx    Angioedema Neg Hx    Immunodeficiency Neg Hx    Past Surgical History:  Procedure Laterality Date   ASD REPAIR  1985   TONSILLECTOMY AND ADENOIDECTOMY     TUBAL LIGATION     Social History   Social History Narrative   Household-- lives by herself   Immunization  History  Administered Date(s) Administered   Influenza Split 03/17/2011, 03/17/2012   Influenza Whole 04/05/2009, 02/21/2010   Influenza, Seasonal, Injecte, Preservative Fre 03/11/2021   Influenza,inj,Quad PF,6+ Mos 03/21/2013, 03/09/2014, 05/24/2015, 01/27/2016   Influenza-Unspecified 07/30/2020   PFIZER(Purple Top)SARS-COV-2 Vaccination 12/16/2019, 01/16/2020   Pneumococcal Conjugate-13 01/27/2016   Pneumococcal Polysaccharide-23 03/08/2015   Td 07/23/2010   Tdap 05/28/2020     Objective: Vital Signs: LMP 05/01/2016    Physical Exam   Musculoskeletal Exam: ***  CDAI Exam: CDAI Score: -- Patient Global: --; Provider  Global: -- Swollen: --; Tender: -- Joint Exam 03/05/2022   No joint exam has been documented for this visit   There is currently no information documented on the homunculus. Go to the Rheumatology activity and complete the homunculus joint exam.  Investigation: No additional findings.  Imaging: LONG TERM MONITOR (3-14 DAYS)  Result Date: 02/13/2022   Sinus rhythm average heart rate 64.   Brief episodes of atrial tachycardia noted.  Benign.   Rare PACs, rare PVCs.   No evidence of atrial fibrillation.   Patient's symptoms were associated with normal sinus rhythm. Patch Wear Time:  3 days and 2 hours (2023-09-18T12:53:58-0400 to 2023-09-21T15:29:45-0400) Patient had a min HR of 31 bpm, max HR of 182 bpm, and avg HR of 64 bpm. Predominant underlying rhythm was Sinus Rhythm. 7 Supraventricular Tachycardia runs occurred, the run with the fastest interval lasting 9.0 secs with a max rate of 182 bpm (avg 165 bpm); the run with the fastest interval was also the longest. Isolated SVEs were rare (<1.0%), SVE Couplets were rare (<1.0%), and SVE Triplets were rare (<1.0%). Isolated VEs were rare (<1.0%), and no VE Couplets or VE Triplets were present.    Recent Labs: Lab Results  Component Value Date   WBC 5.4 01/20/2022   HGB 12.7 01/20/2022   PLT 152 01/20/2022   NA 141 01/20/2022   K 3.9 01/20/2022   CL 108 01/20/2022   CO2 26 01/20/2022   GLUCOSE 93 01/20/2022   BUN 11 01/20/2022   CREATININE 0.82 01/20/2022   BILITOT 0.8 01/02/2022   ALKPHOS 74 04/08/2021   AST 20 01/02/2022   ALT 14 01/02/2022   PROT 7.6 01/02/2022   ALBUMIN 4.5 04/08/2021   CALCIUM 10.0 01/20/2022   GFRAA 89 02/15/2020   QFTBGOLDPLUS NEGATIVE 01/31/2021    Speciality Comments: Dexa- 05/27/18 Osteopenia T-Score -1.7 BMD 0.963  Prior therapy includes: Plaquenil (inadequate response) , Enbrel (injection site reaction) and methotrexate (neutropenia so on low dose).  Procedures:  No procedures performed Allergies:  Etanercept, Methotrexate derivatives, and Pregabalin   Assessment / Plan:     Visit Diagnoses: No diagnosis found.  Orders: No orders of the defined types were placed in this encounter.  No orders of the defined types were placed in this encounter.   Face-to-face time spent with patient was *** minutes. Greater than 50% of time was spent in counseling and coordination of care.  Follow-Up Instructions: No follow-ups on file.   Earnestine Mealing, CMA  Note - This record has been created using Editor, commissioning.  Chart creation errors have been sought, but may not always  have been located. Such creation errors do not reflect on  the standard of medical care.

## 2022-02-20 NOTE — Telephone Encounter (Signed)
Advise patient, scan looks okay.  I was concerned about slow heart rate but that is not an issue.  Good results.

## 2022-02-27 ENCOUNTER — Ambulatory Visit (INDEPENDENT_AMBULATORY_CARE_PROVIDER_SITE_OTHER): Payer: Managed Care, Other (non HMO) | Admitting: Internal Medicine

## 2022-02-27 ENCOUNTER — Encounter: Payer: Self-pay | Admitting: Internal Medicine

## 2022-02-27 VITALS — BP 126/68 | HR 54 | Temp 98.1°F | Resp 16 | Ht 68.5 in | Wt 130.2 lb

## 2022-02-27 DIAGNOSIS — W19XXXD Unspecified fall, subsequent encounter: Secondary | ICD-10-CM | POA: Diagnosis not present

## 2022-02-27 DIAGNOSIS — R928 Other abnormal and inconclusive findings on diagnostic imaging of breast: Secondary | ICD-10-CM | POA: Diagnosis not present

## 2022-02-27 NOTE — Patient Instructions (Addendum)
I expect the wound on your lip to improve gradually with less swelling and less pain.  If that is not happening in the next 2 to 3 weeks let me know  If you notice redness, more swelling, discharge or the stitches are not going away: Please come back  If you have help with a follow-up mammogram or ultrasound please let me know  See you in February for your physical

## 2022-02-27 NOTE — Progress Notes (Unsigned)
Subjective:    Patient ID: Brittany Hodges, female    DOB: [birth year], 56 y.o.   MRN: [id number]  DOS:  02/27/2022 Type of visit - description: ER f/u  ER visit 02/25/2022 She was going to the bathroom at night, she tripped and fell, landed on her left face, had a lip laceration. Prior to the incident she was not dizzy, no syncope or presyncope. After the incident, no LOC.  Went to the ER, work-up: CT head, maxillofacial and cervical: No acute changes, cervical DJD.  Reversal of normal cervical lordosis could be positional or due to a spasm  They placed stitches.  Shortly after the ER visit went to her dentist, they noted that chip on one of her teeth.   At this point she feels okay. Appropriately sore at the left face. No headache per se, no dizziness. No nausea or vomiting.  Review of Systems See above   Past Medical History:  Diagnosis Date   Allergic rhinitis, cause unspecified    Anemia    Asthma    Atrial septal defect    s/p repair;   Echo 08/07/10: EF 55-60%; mild MR; atrial septal aneurysm; no residual ASD   Chest pain, unspecified    cardiac cath 07/31/10: normal coronaries; vigorous LVF   Esophageal reflux    Fibromyalgia    Headache(784.0)    Hematuria, unspecified    Hypertension    no meds    Hypothyroidism    Irritable bowel syndrome    Low back pain    Rheumatoid arthritis(714.0)    Dr Herold Harms   Scoliosis    Sinoatrial node dysfunction (HCC)    eval. for chronotropic competence completed in past    Past Surgical History:  Procedure Laterality Date   ASD REPAIR  1985   TONSILLECTOMY AND ADENOIDECTOMY     TUBAL LIGATION      Current Outpatient Medications  Medication Instructions   amLODipine (NORVASC) 2.5 MG tablet TAKE 1 TABLET BY MOUTH EVERY DAY   BLACK CURRANT SEED OIL PO Oral, Daily   Carboxymethylcellulose Sodium (EYE DROPS OP) Ophthalmic, Daily   Cimzia 400 mg, Subcutaneous, Every 28 days, In-office injections   meclizine  (ANTIVERT) 12.5 mg, Oral, 3 times daily PRN   prednisoLONE acetate (PRED FORTE) 1 % ophthalmic suspension 1 drop, Both Eyes, Daily   Vitamin D 2,000 Units, Oral, Daily       Objective:   Physical Exam BP 126/68   Pulse (!) 54   Temp 98.1 F (36.7 C) (Oral)   Resp 16   Ht 5' 8.5" (1.74 m)   Wt 130 lb 4 oz (59.1 kg)   LMP 05/01/2016   SpO2 99%   BMI 19.52 kg/m  General:   Well developed, NAD, BMI noted. HEENT:  Normocephalic . Lower lip, L side: + Swelling, I am able to see the stitches.  No discharge EOMI Not tender at the orbits, slight tender at the L cheek with minimal swelling in the area Neck: Range of motion is normal. Lungs:  CTA B Normal respiratory effort, no intercostal retractions, no accessory muscle use. Heart: RRR,  no murmur.  Lower extremities: no pretibial edema bilaterally  Skin: Not pale. Not jaundice Neurologic:  alert & oriented X3.  Speech normal, gait appropriate for age and unassisted Psych--  Cognition and judgment appear intact.  Cooperative with normal attention span and concentration.  Behavior appropriate. No anxious or depressed appearing.      Assessment  Assessment   Prediabetes: A1c 6.0 (01-2017) HTN H/o Hypothyroidism (took meds at some point) GERD- dysphagia -Dr. Norville Haggard EGD from 04/02/2014 (Dr Delrae Alfred): Narrowing of proximal esophagus, question of extrinsic extrinsic compression, EUS showing no intrinsic esophageal mass but the spine was seen in close proximity  to the esophagus probably resulting in extrinsic compression. The endoscopist recommend repeat CT neck and chest in 3 months.saw ENT 04-2015 for dysphagia,had a scope,  rx PPI Asthma, uses alb rarely  CV: --Atrial septal defect: Status post repair, no residual ASD: Needs ABX prophylaxis preprocedures --Sinoatrial  Dysfunction, sees Dr Caryl Comes   --CP: cath 2012, normal coronaries MSK: --Rheumatoid arthritis,   Dr. Herold Harms + rheumatoid factor +CCP,   h/o  low WBC with  Methotrexate,   injection site reaction to Enbrel  , self d/c Humira ~ 12/2018 --Osteopenia: T score -1.7 (2020), -1.1 10/2020 IBS (Miralax prn constipation)   Admission: syncope 04-2016 (unlikely to be arrhythmia related per cards ) History of fibromyalgia   PLAN:  Fall, subsequent encounter: Mechanical fall, resulted in a contusion of the L face with negative CTs of the head/maxillofacial/cervical spine. Anticipate she will gradually get better. Laceration of the lip was sutured with "dissolvable" 2 stitches per patient.  I have not been able to find procedure note in the chart.  Consequently I recommend local care with topical antibiotics for few days, call in 2 to 3 days if not better, see AVS. Abnormal ultrasound breast: brought a report of a imaging of the breasts, they found R and L axillary lymphadenopathy, the report said to contact your physician or go to a breast center, she already contacted [facility] and plans to work with them to check for further steps.  If unable to do so she will let me know. RTC 06-2022 schedule for CPX

## 2022-03-01 NOTE — Assessment & Plan Note (Signed)
Fall, subsequent encounter: Mechanical fall, resulted in a contusion of the L face with negative CTs of the head/maxillofacial/cervical spine. Anticipate she will gradually get better. Laceration of the lip was sutured with "dissolvable" 2 stitches per patient.  I have not been able to find procedure note in the chart.  Consequently I recommend local care with topical antibiotics for few days, call in 2 to 3 days if not better, see AVS. Abnormal ultrasound breast: brought a report of a imaging of the breasts, they found R and L axillary lymphadenopathy, the report said to contact your physician or go to a breast center, she already contacted [facility] and plans to work with them to check for further steps.  If unable to do so she will let me know. RTC 06-2022 schedule for CPX

## 2022-03-02 ENCOUNTER — Telehealth: Payer: Self-pay | Admitting: Rheumatology

## 2022-03-02 NOTE — Telephone Encounter (Signed)
Patient called the office to reschedule upcoming follow up and Cimzia. Patient states she was in an accident a few days ago and is on antibiotics. Patient states she is also having a root canal on Thursday and assumes she will be on antibiotics then as well. Patient did reschedule appointment for Nov 2nd. Patient wants to make sure she can have Cimzia that same day. Please advise.

## 2022-03-03 NOTE — Telephone Encounter (Signed)
Attempted to contact the patient and left message for patient to call the office.  

## 2022-03-03 NOTE — Telephone Encounter (Signed)
Patient advised Lovena Le would recommend for the patient to have updated CBC and CMP prior to having her next cimzia injection.   Ok to proceed with cimzia if she has no signs of an infection and is completely off of antibiotics.  Patient advised to receive a letter of clearance from dentist after root canal to restart cimzia. Patient expressed understanding.

## 2022-03-03 NOTE — Telephone Encounter (Signed)
I would recommend for the patient to have updated CBC and CMP prior to having her next cimzia injection.   Ok to proceed with cimzia if she has no signs of an infection and is completely off of antibiotics.  Please advise patient to receive a letter of clearance from dentist after root canal to restart cimzia.

## 2022-03-04 ENCOUNTER — Encounter: Payer: Self-pay | Admitting: Internal Medicine

## 2022-03-04 ENCOUNTER — Other Ambulatory Visit: Payer: Self-pay | Admitting: *Deleted

## 2022-03-04 DIAGNOSIS — Z9225 Personal history of immunosupression therapy: Secondary | ICD-10-CM

## 2022-03-04 DIAGNOSIS — Z111 Encounter for screening for respiratory tuberculosis: Secondary | ICD-10-CM

## 2022-03-04 LAB — HM MAMMOGRAPHY

## 2022-03-05 ENCOUNTER — Ambulatory Visit: Payer: Managed Care, Other (non HMO) | Admitting: Physician Assistant

## 2022-03-05 DIAGNOSIS — H209 Unspecified iridocyclitis: Secondary | ICD-10-CM

## 2022-03-05 DIAGNOSIS — M19041 Primary osteoarthritis, right hand: Secondary | ICD-10-CM

## 2022-03-05 DIAGNOSIS — Z87898 Personal history of other specified conditions: Secondary | ICD-10-CM

## 2022-03-05 DIAGNOSIS — M0579 Rheumatoid arthritis with rheumatoid factor of multiple sites without organ or systems involvement: Secondary | ICD-10-CM

## 2022-03-05 DIAGNOSIS — M7061 Trochanteric bursitis, right hip: Secondary | ICD-10-CM

## 2022-03-05 DIAGNOSIS — D708 Other neutropenia: Secondary | ICD-10-CM

## 2022-03-05 DIAGNOSIS — Z8639 Personal history of other endocrine, nutritional and metabolic disease: Secondary | ICD-10-CM

## 2022-03-05 DIAGNOSIS — Z8719 Personal history of other diseases of the digestive system: Secondary | ICD-10-CM

## 2022-03-05 DIAGNOSIS — Z8739 Personal history of other diseases of the musculoskeletal system and connective tissue: Secondary | ICD-10-CM

## 2022-03-05 DIAGNOSIS — M5030 Other cervical disc degeneration, unspecified cervical region: Secondary | ICD-10-CM

## 2022-03-05 DIAGNOSIS — Z79899 Other long term (current) drug therapy: Secondary | ICD-10-CM

## 2022-03-05 DIAGNOSIS — Z8669 Personal history of other diseases of the nervous system and sense organs: Secondary | ICD-10-CM

## 2022-03-05 DIAGNOSIS — Z8679 Personal history of other diseases of the circulatory system: Secondary | ICD-10-CM

## 2022-03-05 DIAGNOSIS — M545 Low back pain, unspecified: Secondary | ICD-10-CM

## 2022-03-05 DIAGNOSIS — M8589 Other specified disorders of bone density and structure, multiple sites: Secondary | ICD-10-CM

## 2022-03-05 DIAGNOSIS — M19071 Primary osteoarthritis, right ankle and foot: Secondary | ICD-10-CM

## 2022-03-05 NOTE — Progress Notes (Deleted)
Office Visit Note  Patient: Brittany Hodges             Date of Birth: 09/30/1965           MRN: [id number]             PCP: Colon Branch, MD Referring: Colon Branch, MD Visit Date: 03/19/2022 Occupation: '@GUAROCC'$ @  Subjective:    History of Present Illness: Brittany Hodges is a 56 y.o. female with history of seropositive rheumatoid arthritis, iritis, and osteoarthritis.  She is on in-office Cimzia.   CBC and BMP drawn on 01/20/22.   TB gold negative on 01/31/21.   Activities of Daily Living:  Patient reports morning stiffness for *** {minute/hour:19697}.   Patient {ACTIONS;DENIES/REPORTS:21021675::"Denies"} nocturnal pain.  Difficulty dressing/grooming: {ACTIONS;DENIES/REPORTS:21021675::"Denies"} Difficulty climbing stairs: {ACTIONS;DENIES/REPORTS:21021675::"Denies"} Difficulty getting out of chair: {ACTIONS;DENIES/REPORTS:21021675::"Denies"} Difficulty using hands for taps, buttons, cutlery, and/or writing: {ACTIONS;DENIES/REPORTS:21021675::"Denies"}  No Rheumatology ROS completed.   PMFS History:  Patient Active Problem List   Diagnosis Date Noted   Osteopenia 01/27/2022   Bradycardia 03/06/2021   Benign paroxysmal positional vertigo 03/06/2021   Mild intermittent asthma 12/22/2020   Chronic sinusitis 09/14/2019   Intolerance, food 09/14/2019   Asthmatic bronchitis 04/06/2016   Laryngopharyngeal reflux (LPR) 04/06/2016   PCP NOTES >>>>> 03/09/2015   Annual physical exam 12/21/2014   Cardiomegaly, by MRI 03/16/2014   Leukopenia 03/10/2014   Dizziness 02/10/2011   SINOATRIAL NODE DYSFUNCTION 08/06/2008   ATRIAL SEPTAL DEFECT 06/29/2007   Hypothyroidism 03/21/2007   Essential hypertension 03/21/2007   Perennial allergic rhinitis 03/21/2007   GERD-- dysphagia 03/21/2007   CONSTIPATION 03/21/2007   IRRITABLE BOWEL SYNDROME 03/21/2007   RA (rheumatoid arthritis) ([place]) 03/21/2007   LOW BACK PAIN 03/21/2007   FIBROMYALGIA 03/21/2007    Past Medical History:   Diagnosis Date   Allergic rhinitis, cause unspecified    Anemia    Asthma    Atrial septal defect    s/p repair;   Echo 08/07/10: EF 55-60%; mild MR; atrial septal aneurysm; no residual ASD   Chest pain, unspecified    cardiac cath 07/31/10: normal coronaries; vigorous LVF   Esophageal reflux    Fibromyalgia    Headache(784.0)    Hematuria, unspecified    Hypertension    no meds    Hypothyroidism    Irritable bowel syndrome    Low back pain    Rheumatoid arthritis(714.0)    Dr Herold Harms   Scoliosis    Sinoatrial node dysfunction (HCC)    eval. for chronotropic competence completed in past    Family History  Problem Relation Age of Onset   Hypertension Mother    Diabetes Mother    Kidney failure Mother    Heart disease Maternal Grandmother    Stomach cancer Maternal Grandmother 67   Rheum arthritis Maternal Grandmother    Glaucoma Father    Breast cancer Other        aunt   Diabetes Other        GM   Hypertension Sister    Thyroid disease Sister    Hypertension Sister    Thyroid disease Sister    Asthma Son    Eczema Son    Sinusitis Daughter    Colon cancer Neg Hx    Allergic rhinitis Neg Hx    Angioedema Neg Hx    Immunodeficiency Neg Hx    Past Surgical History:  Procedure Laterality Date   ASD REPAIR  1985   TONSILLECTOMY AND ADENOIDECTOMY  TUBAL LIGATION     Social History   Social History Narrative   Household-- lives by herself   Immunization History  Administered Date(s) Administered   Influenza Split 03/17/2011, 03/17/2012   Influenza Whole 04/05/2009, 02/21/2010   Influenza, Seasonal, Injecte, Preservative Fre 03/11/2021   Influenza,inj,Quad PF,6+ Mos 03/21/2013, 03/09/2014, 05/24/2015, 01/27/2016   Influenza-Unspecified 07/30/2020, 02/17/2022   PFIZER(Purple Top)SARS-COV-2 Vaccination 12/16/2019, 01/16/2020   Pneumococcal Conjugate-13 01/27/2016   Pneumococcal Polysaccharide-23 03/08/2015   Td 07/23/2010   Tdap 05/28/2020, 02/25/2022      Objective: Vital Signs: LMP 05/01/2016    Physical Exam Vitals and nursing note reviewed.  Constitutional:      Appearance: She is well-developed.  HENT:     Head: Normocephalic and atraumatic.  Eyes:     Conjunctiva/sclera: Conjunctivae normal.  Cardiovascular:     Rate and Rhythm: Normal rate and regular rhythm.     Heart sounds: Normal heart sounds.  Pulmonary:     Effort: Pulmonary effort is normal.     Breath sounds: Normal breath sounds.  Abdominal:     General: Bowel sounds are normal.     Palpations: Abdomen is soft.  Musculoskeletal:     Cervical back: Normal range of motion.  Skin:    General: Skin is warm and dry.     Capillary Refill: Capillary refill takes less than 2 seconds.  Neurological:     Mental Status: She is alert and oriented to person, place, and time.  Psychiatric:        Behavior: Behavior normal.      Musculoskeletal Exam: ***  CDAI Exam: CDAI Score: -- Patient Global: --; Provider Global: -- Swollen: --; Tender: -- Joint Exam 03/19/2022   No joint exam has been documented for this visit   There is currently no information documented on the homunculus. Go to the Rheumatology activity and complete the homunculus joint exam.  Investigation: No additional findings.  Imaging: LONG TERM MONITOR (3-14 DAYS)  Result Date: 02/13/2022   Sinus rhythm average heart rate 64.   Brief episodes of atrial tachycardia noted.  Benign.   Rare PACs, rare PVCs.   No evidence of atrial fibrillation.   Patient's symptoms were associated with normal sinus rhythm. Patch Wear Time:  3 days and 2 hours (2023-09-18T12:53:58-0400 to 2023-09-21T15:29:45-0400) Patient had a min HR of 31 bpm, max HR of 182 bpm, and avg HR of 64 bpm. Predominant underlying rhythm was Sinus Rhythm. 7 Supraventricular Tachycardia runs occurred, the run with the fastest interval lasting 9.0 secs with a max rate of 182 bpm (avg 165 bpm); the run with the fastest interval was also the  longest. Isolated SVEs were rare (<1.0%), SVE Couplets were rare (<1.0%), and SVE Triplets were rare (<1.0%). Isolated VEs were rare (<1.0%), and no VE Couplets or VE Triplets were present.    Recent Labs: Lab Results  Component Value Date   WBC 5.4 01/20/2022   HGB 12.7 01/20/2022   PLT 152 01/20/2022   NA 141 01/20/2022   K 3.9 01/20/2022   CL 108 01/20/2022   CO2 26 01/20/2022   GLUCOSE 93 01/20/2022   BUN 11 01/20/2022   CREATININE 0.82 01/20/2022   BILITOT 0.8 01/02/2022   ALKPHOS 74 04/08/2021   AST 20 01/02/2022   ALT 14 01/02/2022   PROT 7.6 01/02/2022   ALBUMIN 4.5 04/08/2021   CALCIUM 10.0 01/20/2022   GFRAA 89 02/15/2020   QFTBGOLDPLUS NEGATIVE 01/31/2021    Speciality Comments: Dexa- 05/27/18 Osteopenia T-Score -1.7  BMD 0.963  Prior therapy includes: Plaquenil (inadequate response) , Enbrel (injection site reaction) and methotrexate (neutropenia so on low dose).  Procedures:  No procedures performed Allergies: Etanercept, Methotrexate derivatives, and Pregabalin   Assessment / Plan:     Visit Diagnoses: No diagnosis found.  Orders: No orders of the defined types were placed in this encounter.  No orders of the defined types were placed in this encounter.   Face-to-face time spent with patient was *** minutes. Greater than 50% of time was spent in counseling and coordination of care.  Follow-Up Instructions: No follow-ups on file.   Earnestine Mealing, CMA  Note - This record has been created using Editor, commissioning.  Chart creation errors have been sought, but may not always  have been located. Such creation errors do not reflect on  the standard of medical care.

## 2022-03-09 ENCOUNTER — Telehealth: Payer: Self-pay | Admitting: *Deleted

## 2022-03-09 NOTE — Telephone Encounter (Signed)
Patient returned call to the office and states she is concerned about receiving a bill for $600 [place]. Patient states she can not afford that each month. Patient has Pharmacist, community. Transferred patient to Devki's voicemail so she can reach out to patient tomorrow to discuss.   Patient also wanted to make [place] aware that she had an abnormal breast ultrasound. Patient states it showed that she has enlarged lymph nodes under both arms. They have ruled out breast cancer.

## 2022-03-09 NOTE — Telephone Encounter (Signed)
Patient called the office and left message regarding her Cimzia. Patient requested a return call.   Attempted to contact the patient and left message for patient to call the office.

## 2022-03-10 ENCOUNTER — Telehealth: Payer: Self-pay | Admitting: Internal Medicine

## 2022-03-10 NOTE — Telephone Encounter (Signed)
Patient would like to speak with [facility] in reference to her fall and visit she had with Dr. Larose Kells on 02/27/2022.

## 2022-03-11 NOTE — Telephone Encounter (Signed)
Spoke w/ Pt- she noticed that she is maybe having swelling on L side of face, she notices her glasses are sitting tighter on the L side, and has noticed bulging on L side of face starting on Monday. She wanted to know if she should see PCP or ENT. Informed it may take several weeks to see ENT, appt scheduled for Friday PM w/ PCP. She will let us know sooner if sx's are worsening.

## 2022-03-13 ENCOUNTER — Ambulatory Visit (INDEPENDENT_AMBULATORY_CARE_PROVIDER_SITE_OTHER): Payer: Managed Care, Other (non HMO) | Admitting: Internal Medicine

## 2022-03-13 ENCOUNTER — Encounter: Payer: Self-pay | Admitting: Internal Medicine

## 2022-03-13 VITALS — BP 126/70 | HR 45 | Temp 98.2°F | Resp 16 | Ht 68.5 in | Wt 132.4 lb

## 2022-03-13 DIAGNOSIS — R928 Other abnormal and inconclusive findings on diagnostic imaging of breast: Secondary | ICD-10-CM | POA: Diagnosis not present

## 2022-03-13 DIAGNOSIS — W19XXXD Unspecified fall, subsequent encounter: Secondary | ICD-10-CM

## 2022-03-13 NOTE — Progress Notes (Unsigned)
Subjective:    Patient ID: Brittany Hodges, female    DOB: 23-Feb-1966, 56 y.o.   MRN: [id number]  DOS:  03/13/2022 Type of visit - description: f/u  She is here because he still have some pain at the left face. Some swelling?.  Also, she has developed a knot at the left lower lip.  Denies any headache or nausea.  Review of Systems See above   Past Medical History:  Diagnosis Date   Allergic rhinitis, cause unspecified    Anemia    Asthma    Atrial septal defect    s/p repair;   Echo 08/07/10: EF 55-60%; mild MR; atrial septal aneurysm; no residual ASD   Chest pain, unspecified    cardiac cath 07/31/10: normal coronaries; vigorous LVF   Esophageal reflux    Fibromyalgia    Headache(784.0)    Hematuria, unspecified    Hypertension    no meds    Hypothyroidism    Irritable bowel syndrome    Low back pain    Rheumatoid arthritis(714.0)    Dr Herold Harms   Scoliosis    Sinoatrial node dysfunction (HCC)    eval. for chronotropic competence completed in past    Past Surgical History:  Procedure Laterality Date   ASD REPAIR  1985   TONSILLECTOMY AND ADENOIDECTOMY     TUBAL LIGATION      Current Outpatient Medications  Medication Instructions   amLODipine (NORVASC) 2.5 MG tablet TAKE 1 TABLET BY MOUTH EVERY DAY   amoxicillin (AMOXIL) 500 MG capsule 1 capsule, Oral, 3 times daily   BLACK CURRANT SEED OIL PO Oral, Daily   Carboxymethylcellulose Sodium (EYE DROPS OP) Ophthalmic, Daily   Cimzia 400 mg, Subcutaneous, Every 28 days, In-office injections   meclizine (ANTIVERT) 12.5 mg, Oral, 3 times daily PRN   prednisoLONE acetate (PRED FORTE) 1 % ophthalmic suspension 1 drop, Both Eyes, Daily   Vitamin D 2,000 Units, Oral, Daily       Objective:   Physical Exam HENT:     Head:     BP 126/70   Pulse (!) 45   Temp 98.2 F (36.8 C) (Oral)   Resp 16   Ht 5' 8.5" (1.74 m)   Wt 132 lb 6 oz (60 kg)   LMP 05/01/2016   SpO2 97%   BMI 19.83 kg/m  General:    Well developed, NAD, BMI noted. HEENT:  Normocephalic .  See graphic Lower extremities: no pretibial edema bilaterally  Skin: Not pale. Not jaundice Neurologic:  alert & oriented X3.  Speech normal, gait appropriate for age and unassisted Face: Motor symmetric.  EOMI. Psych--  Cognition and judgment appear intact.  Cooperative with normal attention span and concentration.  Behavior appropriate. No anxious or depressed appearing.      Assessment      Assessment   Prediabetes: A1c 6.0 (01-2017) HTN H/o Hypothyroidism (took meds at some point) GERD- dysphagia -Dr. Norville Haggard EGD from 04/02/2014 (Dr Delrae Alfred): Narrowing of proximal esophagus, question of extrinsic extrinsic compression, EUS showing no intrinsic esophageal mass but the spine was seen in close proximity  to the esophagus probably resulting in extrinsic compression. The endoscopist recommend repeat CT neck and chest in 3 months.saw ENT 04-2015 for dysphagia,had a scope,  rx PPI Asthma, uses alb rarely  CV: --Atrial septal defect: Status post repair, no residual ASD: Needs ABX prophylaxis preprocedures --Sinoatrial  Dysfunction, sees Dr Caryl Comes   --CP: cath 2012, normal coronaries MSK: --Rheumatoid arthritis,   Dr.  Davenshwar + rheumatoid factor +CCP,   h/o  low WBC with Methotrexate,   injection site reaction to Enbrel  , self d/c Humira ~ 12/2018 --Osteopenia: T score -1.7 (2020), -1.1 10/2020 IBS (Miralax prn constipation)   Admission: syncope 04-2016 (unlikely to be arrhythmia related per cards ) History of fibromyalgia   PLAN:  Fall, subsequent encounter: See LOV, she had a mechanical fall, CTs of the head/maxillofacial-cervical spine were negative. She still has some pain at the left face, area is normal to inspection and palpation, recommend to give more time, to reach out in 2 to 3 weeks if she is still hurting. Lower lip wound: Patient reports she saw one stitch fallout, I removed 2 stitches today, she is very sure  that 3 sutures were placed.  The area is still indurated and slightly larger compared to the right.  Recommend observation for now, no evidence of abscess or infection.  If not gradually back to normal: ENT. Abdominal ultrasound breast: See last visit, she was evaluated at our local breast clinic, imaging was benign per pt .

## 2022-03-14 NOTE — Assessment & Plan Note (Signed)
Fall, subsequent encounter: See LOV, she had a mechanical fall, CTs of the head/maxillofacial-cervical spine were negative. She still has some pain at the left face, area is normal to inspection and palpation, recommend to give more time, to reach out in 2 to 3 weeks if she is still hurting. Lower lip wound: Patient reports she saw one stitch fallout, I removed 2 stitches today, she is very sure that 3 sutures were placed.  The area is still indurated and slightly larger compared to the right.  Recommend observation for now, no evidence of abscess or infection.  If not gradually back to normal: ENT. Abdominal ultrasound breast: See last visit, she was evaluated at our local breast clinic, imaging was benign per pt .

## 2022-03-16 NOTE — Telephone Encounter (Signed)
Will submit EOB to Cimzia savings program and start process for copay assistance funds.  Spoke with patient regarding this and advised that $15000 are reloaded every calendar year and pay any Cimzia in-office bills.   EOB from 02/05/22 still pending. Pharmacy team will continue to f/u regarding this before submitting any claims to copay assistance. Spoke with patient regarding this and she verbalized understanding.  Knox Saliva, PharmD, MPH, BCPS, CPP Clinical Pharmacist (Rheumatology and Pulmonology)

## 2022-03-19 ENCOUNTER — Ambulatory Visit: Payer: Managed Care, Other (non HMO) | Admitting: Physician Assistant

## 2022-03-19 DIAGNOSIS — M19071 Primary osteoarthritis, right ankle and foot: Secondary | ICD-10-CM

## 2022-03-19 DIAGNOSIS — Z8669 Personal history of other diseases of the nervous system and sense organs: Secondary | ICD-10-CM

## 2022-03-19 DIAGNOSIS — M7061 Trochanteric bursitis, right hip: Secondary | ICD-10-CM

## 2022-03-19 DIAGNOSIS — M5030 Other cervical disc degeneration, unspecified cervical region: Secondary | ICD-10-CM

## 2022-03-19 DIAGNOSIS — Z87898 Personal history of other specified conditions: Secondary | ICD-10-CM

## 2022-03-19 DIAGNOSIS — H209 Unspecified iridocyclitis: Secondary | ICD-10-CM

## 2022-03-19 DIAGNOSIS — M19041 Primary osteoarthritis, right hand: Secondary | ICD-10-CM

## 2022-03-19 DIAGNOSIS — Z8679 Personal history of other diseases of the circulatory system: Secondary | ICD-10-CM

## 2022-03-19 DIAGNOSIS — Z8739 Personal history of other diseases of the musculoskeletal system and connective tissue: Secondary | ICD-10-CM

## 2022-03-19 DIAGNOSIS — M8589 Other specified disorders of bone density and structure, multiple sites: Secondary | ICD-10-CM

## 2022-03-19 DIAGNOSIS — Z8719 Personal history of other diseases of the digestive system: Secondary | ICD-10-CM

## 2022-03-19 DIAGNOSIS — Z79899 Other long term (current) drug therapy: Secondary | ICD-10-CM

## 2022-03-19 DIAGNOSIS — G8929 Other chronic pain: Secondary | ICD-10-CM

## 2022-03-19 DIAGNOSIS — D708 Other neutropenia: Secondary | ICD-10-CM

## 2022-03-19 DIAGNOSIS — M0579 Rheumatoid arthritis with rheumatoid factor of multiple sites without organ or systems involvement: Secondary | ICD-10-CM

## 2022-03-19 DIAGNOSIS — Z8639 Personal history of other endocrine, nutritional and metabolic disease: Secondary | ICD-10-CM

## 2022-03-27 ENCOUNTER — Telehealth: Payer: Self-pay | Admitting: Rheumatology

## 2022-03-27 NOTE — Telephone Encounter (Signed)
Patient called the office requesting to speak with Seth Bake regarding her Cimzia. Patient states she needed clearance from her doctor to restart Cimzia because she had a root canal. Patient states she is having to have another root canal on Thursday 11/16. Patient wishes to speak to Seth Bake before Thursday.

## 2022-03-30 NOTE — Telephone Encounter (Signed)
Returned call to the patient and she states she is having another root canal. Patient states she took a bad fall a couple of weeks ago. Patient states she damaged several of her teeth and may need two additional root canals. Patient advised to keep [place] updated and to get a clearance letter sent to [place] once she has had her procedure and has been cleared to restart Cimzia. Patient expressed understanding.

## 2022-03-30 NOTE — Telephone Encounter (Signed)
EOB submitted to Cimzia savings portal for her 02/05/22 Cimzia injection  Knox Saliva, PharmD, MPH, BCPS, CPP Clinical Pharmacist (Rheumatology and Pulmonology)

## 2022-04-02 NOTE — Telephone Encounter (Signed)
EOB charged for visit today  Knox Saliva, PharmD, MPH, BCPS, CPP Clinical Pharmacist (Rheumatology and Pulmonology)

## 2022-04-13 NOTE — Telephone Encounter (Signed)
Submitted EOB on Cimzia savings portal for admin code  Knox Saliva, PharmD, MPH, BCPS, CPP Clinical Pharmacist (Rheumatology and Pulmonology)

## 2022-05-25 ENCOUNTER — Telehealth: Payer: Self-pay | Admitting: Pharmacist

## 2022-05-25 NOTE — Telephone Encounter (Signed)
Received reverification of benefits from [employer].  Patient has active Art therapist as of 05/18/2022. [employer] is in-network. Patient has 25% co-insurance for in-office Cimzia and 25% office visit co-insurance. No pre-certification is required for J0717. Patient has $4700 out-of-pocket max for family.  CPT codes [phone] and [phone] are valid and billable with 25% coinsurance afte deductible is met. The plan deductible is $2200.  Detailed results have been sent to media tab of patient's chart.  Knox Saliva, PharmD, MPH, BCPS, CPP Clinical Pharmacist (Rheumatology and Pulmonology)

## 2022-07-03 ENCOUNTER — Ambulatory Visit: Payer: Managed Care, Other (non HMO) | Admitting: Internal Medicine

## 2022-07-03 ENCOUNTER — Encounter: Payer: Self-pay | Admitting: Internal Medicine

## 2022-07-03 VITALS — BP 130/80 | HR 40 | Temp 98.1°F | Resp 16 | Ht 68.5 in | Wt 133.1 lb

## 2022-07-03 DIAGNOSIS — R739 Hyperglycemia, unspecified: Secondary | ICD-10-CM

## 2022-07-03 DIAGNOSIS — I10 Essential (primary) hypertension: Secondary | ICD-10-CM | POA: Diagnosis not present

## 2022-07-03 DIAGNOSIS — E569 Vitamin deficiency, unspecified: Secondary | ICD-10-CM

## 2022-07-03 DIAGNOSIS — E559 Vitamin D deficiency, unspecified: Secondary | ICD-10-CM

## 2022-07-03 DIAGNOSIS — Z0000 Encounter for general adult medical examination without abnormal findings: Secondary | ICD-10-CM

## 2022-07-03 DIAGNOSIS — E079 Disorder of thyroid, unspecified: Secondary | ICD-10-CM

## 2022-07-03 NOTE — Patient Instructions (Addendum)
    GO TO THE LAB : Get the blood work     [place], [place] back for a physical exam in 1 year    "Ages of attorney" ,  "Living will" (Advance care planning documents)  If you already have a living will or healthcare power of attorney, is recommended you bring the copy to be scanned in your chart.   The document will be available to all the doctors you see in the system.  Advance care planning is a process that supports adults in  understanding and sharing their preferences regarding future medical care.  The patient's preferences are recorded in documents called Advance Directives and the can be modified at any time while the patient is in full mental capacity.   If you don't have one, please consider create one.      More information at: [email or web address]

## 2022-07-03 NOTE — Progress Notes (Unsigned)
Subjective:    Patient ID: Brittany Hodges, female    DOB: 1965/10/07, 57 y.o.   MRN: [id number]  DOS:  07/03/2022 Type of visit - description: CPX  Since the last office visit is doing okay. still having issues with rheumatoid arthritis, currently has episcleritis.  Review of Systems  Other than above, a 14 point review of systems is negative    Past Medical History:  Diagnosis Date   Allergic rhinitis, cause unspecified    Anemia    Asthma    Atrial septal defect    s/p repair;   Echo 08/07/10: EF 55-60%; mild MR; atrial septal aneurysm; no residual ASD   Chest pain, unspecified    cardiac cath 07/31/10: normal coronaries; vigorous LVF   Esophageal reflux    Fibromyalgia    Headache(784.0)    Hematuria, unspecified    Hypertension    no meds    Hypothyroidism    Irritable bowel syndrome    Low back pain    Rheumatoid arthritis(714.0)    Dr Herold Harms   Scoliosis    Sinoatrial node dysfunction (HCC)    eval. for chronotropic competence completed in past    Past Surgical History:  Procedure Laterality Date   ASD REPAIR  1985   TONSILLECTOMY AND ADENOIDECTOMY     TUBAL LIGATION     Social History   Socioeconomic History   Marital status: Single    Spouse name: Not on file   Number of children: 3   Years of education: Not on file   Highest education level: Not on file  Occupational History   Occupation: [employer] access  Tobacco Use   Smoking status: Never    Passive exposure: Never   Smokeless tobacco: Never   Tobacco comments:    never used tobacco  Vaping Use   Vaping Use: Never used  Substance and Sexual Activity   Alcohol use: No   Drug use: No   Sexual activity: Not Currently    Birth control/protection: Surgical  Other Topics Concern   Not on file  Social History Narrative   Household-- lives by herself   Social Determinants of Health   Financial Resource Strain: Not on file  Food Insecurity: Not on file  Transportation Needs: Not  on file  Physical Activity: Not on file  Stress: Not on file  Social Connections: Not on file  Intimate Partner Violence: Not on file    Current Outpatient Medications  Medication Instructions   amLODipine (NORVASC) 2.5 MG tablet TAKE 1 TABLET BY MOUTH EVERY DAY   amoxicillin (AMOXIL) 500 MG capsule 2,000 capsules, Oral,  Once   BLACK CURRANT SEED OIL PO Oral, Daily   Carboxymethylcellulose Sodium (EYE DROPS OP) Ophthalmic, Daily   Cimzia 400 mg, Subcutaneous, Every 28 days, In-office injections   meclizine (ANTIVERT) 12.5 mg, Oral, 3 times daily PRN   prednisoLONE acetate (PRED FORTE) 1 % ophthalmic suspension 1 drop, Both Eyes, Daily   Vitamin D 2,000 Units, Oral, Daily       Objective:   Physical Exam BP 130/80   Pulse (!) 40   Temp 98.1 F (36.7 C) (Oral)   Resp 16   Ht 5' 8.5" (1.74 m)   Wt 133 lb 2 oz (60.4 kg)   LMP 05/01/2016   SpO2 98%   BMI 19.95 kg/m  General: Well developed, NAD, BMI noted Neck: No  thyromegaly  HEENT:  Normocephalic . Face symmetric, atraumatic Lungs:  CTA B Normal respiratory effort,  no intercostal retractions, no accessory muscle use. Heart: RRR,  no murmur.  Abdomen:  Not distended, soft, non-tender. No rebound or rigidity.   Lower extremities: no pretibial edema bilaterally  Skin: Exposed areas without rash. Not pale. Not jaundice Neurologic:  alert & oriented X3.  Speech normal, gait appropriate for age and unassisted Strength symmetric and appropriate for age.  Psych: Cognition and judgment appear intact.  Cooperative with normal attention span and concentration.  Behavior appropriate. No anxious or depressed appearing.     Assessment     Assessment   Prediabetes: A1c 6.0 (01-2017) HTN H/o Hypothyroidism (took meds at some point) GERD- dysphagia -Dr. Norville Haggard EGD from 04/02/2014 (Dr Delrae Alfred): Narrowing of proximal esophagus, question of extrinsic extrinsic compression, EUS showing no intrinsic esophageal mass but the  spine was seen in close proximity  to the esophagus probably resulting in extrinsic compression. The endoscopist recommend repeat CT neck and chest in 3 months.saw ENT 04-2015 for dysphagia,had a scope,  rx PPI Asthma, uses alb rarely  CV: --Atrial septal defect: Status post repair, no residual ASD: Needs ABX prophylaxis preprocedures --Sinoatrial  Dysfunction, sees Dr Caryl Comes   --CP: cath 2012, normal coronaries MSK: --Rheumatoid arthritis,   Dr. Herold Harms + rheumatoid factor +CCP,   h/o  low WBC with Methotrexate,   injection site reaction to Enbrel  , self d/c Humira ~ 12/2018 --Osteopenia: T score -1.7 (2020), -1.1 10/2020 IBS (Miralax prn constipation)   Admission: syncope 04-2016 (unlikely to be arrhythmia related per cards ) History of fibromyalgia   PLAN:  Here for CPX Prediabetes: Check A1c HTN: On amlodipine, BP is very good.  Checking labs Thyroid disease: TSH was elevated at some point, checking labs GERD: Not an issue Asthma, not an issue Cardiovascular: Bradycardic but asymptomatic.  Sees cardiology regularly Rheumatoid arthritis: Follow-up by rheumatology and also ophthalmology due to episcleritis. RTC 1 year

## 2022-07-04 ENCOUNTER — Encounter: Payer: Self-pay | Admitting: Internal Medicine

## 2022-07-04 NOTE — Assessment & Plan Note (Signed)
-  Tdap 2022  - Pneumovax   2016;  prevnar 01-2016 - shingrix d/w pt - declines -COVID VAX -  booster - declines  - had a  flu shot -Female care: per  Gynecology,  MMG 02-2022 -CCS: Cscope  05/07/2011 negative,Cscope 09/2019 , 10 years per report  - Diet and exercise: Discussed, she would like to gain some weight.  Encouraged light weight lifting - labs: CMP FLP CBC vitamin D A1c TSH T4; also request magnesium and zinc check. -Healthcare power of attorney: See AVS

## 2022-07-04 NOTE — Assessment & Plan Note (Signed)
Here for CPX Prediabetes: Check A1c HTN: On amlodipine, BP is very good.  Checking labs Thyroid disease: TSH was elevated at some point, checking labs GERD: Not an issue Asthma, not an issue Cardiovascular: Bradycardic but asymptomatic.  Sees cardiology regularly Rheumatoid arthritis: Follow-up by rheumatology and also ophthalmology due to episcleritis. RTC 1 year

## 2022-07-07 ENCOUNTER — Telehealth: Payer: Self-pay | Admitting: Rheumatology

## 2022-07-07 NOTE — Telephone Encounter (Signed)
Patient called the office stating she would like to talk to [facility] about patient assistance for Cimzia.

## 2022-07-09 NOTE — Progress Notes (Unsigned)
Office Visit Note  Patient: Brittany Hodges             Date of Birth: 1966/03/19           MRN: [id number]             PCP: Colon Branch, MD Referring: Colon Branch, MD Visit Date: 07/15/2022 Occupation: '@GUAROCC'$ @  Subjective:  Discuss restarting cimzia  History of Present Illness: Brittany Hodges is a 57 y.o. female with history of seropositive rheumatoid arthritis, iritis, and osteoarthritis.  Patient was last seen in the office on 01/02/2022 at which time the plan was for her to reinitiate in office Cimzia injections once monthly.  She resumed Cimzia on 02/05/2022 but only had 1 injection.  She had a traumatic fall in October 2023 requiring endodontic treatment and antibiotics.  She had to hold Cimzia during the healing process and until she had completed antibiotics.  She was cleared by Dr. Sue Lush on 05/26/2022 to resume Cimzia.  She is currently awaiting approval for an office Cimzia.  Patient reports that she has been having weekly flares of iritis in her eyes alternating between both eyes.  She has prednisolone eyedrops which she uses as needed and also has oral prednisone if needed.  She states that last week she started to have pain and swelling in her right hand.  She takes Tylenol as needed for pain relief.   Activities of Daily Living:  Patient reports morning stiffness for 0 minutes.   Patient Denies nocturnal pain.  Difficulty dressing/grooming: Denies Difficulty climbing stairs: Denies Difficulty getting out of chair: Denies Difficulty using hands for taps, buttons, cutlery, and/or writing: Denies  Review of Systems  Constitutional: Negative.  Negative for fatigue.  HENT:  Positive for mouth dryness. Negative for mouth sores.   Eyes:  Positive for dryness.  Respiratory: Negative.  Negative for shortness of breath.   Cardiovascular: Negative.  Negative for chest pain and palpitations.  Gastrointestinal: Negative.  Negative for blood in stool, constipation and  diarrhea.  Endocrine: Negative.  Negative for increased urination.  Genitourinary: Negative.  Negative for involuntary urination.  Musculoskeletal:  Positive for joint pain, joint pain and joint swelling. Negative for gait problem, myalgias, muscle weakness, morning stiffness, muscle tenderness and myalgias.  Skin: Negative.  Negative for color change, rash, hair loss and sensitivity to sunlight.  Allergic/Immunologic: Negative.  Negative for susceptible to infections.  Neurological: Negative.  Negative for dizziness and headaches.  Hematological: Negative.  Negative for swollen glands.  Psychiatric/Behavioral: Negative.  Negative for depressed mood and sleep disturbance. The patient is not nervous/anxious.     PMFS History:  Patient Active Problem List   Diagnosis Date Noted   Osteopenia 01/27/2022   Bradycardia 03/06/2021   Benign paroxysmal positional vertigo 03/06/2021   Mild intermittent asthma 12/22/2020   Chronic sinusitis 09/14/2019   Intolerance, food 09/14/2019   Asthmatic bronchitis 04/06/2016   Laryngopharyngeal reflux (LPR) 04/06/2016   PCP NOTES >>>>> 03/09/2015   Annual physical exam 12/21/2014   Cardiomegaly, by MRI 03/16/2014   Leukopenia 03/10/2014   Dizziness 02/10/2011   SINOATRIAL NODE DYSFUNCTION 08/06/2008   ATRIAL SEPTAL DEFECT 06/29/2007   Hypothyroidism 03/21/2007   Essential hypertension 03/21/2007   Perennial allergic rhinitis 03/21/2007   GERD-- dysphagia 03/21/2007   CONSTIPATION 03/21/2007   IRRITABLE BOWEL SYNDROME 03/21/2007   RA (rheumatoid arthritis) ([place]) 03/21/2007   LOW BACK PAIN 03/21/2007   FIBROMYALGIA 03/21/2007    Past Medical History:  Diagnosis Date  Allergic rhinitis, cause unspecified    Anemia    Asthma    Atrial septal defect    s/p repair;   Echo 08/07/10: EF 55-60%; mild MR; atrial septal aneurysm; no residual ASD   Chest pain, unspecified    cardiac cath 07/31/10: normal coronaries; vigorous LVF   Esophageal reflux     Fibromyalgia    Headache(784.0)    Hematuria, unspecified    Hypertension    no meds    Hypothyroidism    Irritable bowel syndrome    Low back pain    Rheumatoid arthritis(714.0)    Dr Herold Harms   Scoliosis    Sinoatrial node dysfunction ([place])    eval. for chronotropic competence completed in past    Family History  Problem Relation Age of Onset   Hypertension Mother    Diabetes Mother    Kidney failure Mother    Glaucoma Father    Hypertension Sister    Thyroid disease Sister    Hypertension Sister    Thyroid disease Sister    Heart disease Maternal Grandmother    Stomach cancer Maternal Grandmother 95   Rheum arthritis Maternal Grandmother    Sinusitis Daughter    Asthma Son    Eczema Son    Breast cancer Other        aunt   Diabetes Other        GM   Colon cancer Neg Hx    Allergic rhinitis Neg Hx    Angioedema Neg Hx    Immunodeficiency Neg Hx    Past Surgical History:  Procedure Laterality Date   ASD REPAIR  1985   TONSILLECTOMY AND ADENOIDECTOMY     TUBAL LIGATION     Social History   Social History Narrative   Household-- lives by herself   Immunization History  Administered Date(s) Administered   Influenza Split 03/17/2011, 03/17/2012   Influenza Whole 04/05/2009, 02/21/2010   Influenza, Seasonal, Injecte, Preservative Fre 03/11/2021   Influenza,inj,Quad PF,6+ Mos 03/21/2013, 03/09/2014, 05/24/2015, 01/27/2016   Influenza-Unspecified 07/30/2020, 02/17/2022   PFIZER(Purple Top)SARS-COV-2 Vaccination 12/16/2019, 01/16/2020   Pneumococcal Conjugate-13 01/27/2016   Pneumococcal Polysaccharide-23 03/08/2015   Td 07/23/2010   Tdap 05/28/2020, 02/25/2022     Objective: Vital Signs: BP 136/86 (BP Location: Left Arm, Patient Position: Sitting, Cuff Size: Normal)   Pulse (!) 56   Resp 16   Ht '5\' 8"'$  (1.727 m)   Wt 133 lb 12.8 oz (60.7 kg)   LMP 05/01/2016   BMI 20.34 kg/m    Physical Exam Vitals and nursing note reviewed.  Constitutional:       Appearance: She is well-developed.  HENT:     Head: Normocephalic and atraumatic.  Eyes:     Conjunctiva/sclera: Conjunctivae normal.  Cardiovascular:     Rate and Rhythm: Normal rate and regular rhythm.     Heart sounds: Normal heart sounds.  Pulmonary:     Effort: Pulmonary effort is normal.     Breath sounds: Normal breath sounds.  Abdominal:     General: Bowel sounds are normal.     Palpations: Abdomen is soft.  Musculoskeletal:     Cervical back: Normal range of motion.  Skin:    General: Skin is warm and dry.     Capillary Refill: Capillary refill takes less than 2 seconds.  Neurological:     Mental Status: She is alert and oriented to person, place, and time.  Psychiatric:        Behavior: Behavior normal.  Musculoskeletal Exam: C-spine, thoracic spine, lumbar spine have good range of motion.  No midline spinal tenderness.  Shoulder joints, elbow joints, wrist joints have good range of motion with no discomfort or synovitis.  Tenderness and synovitis over the right fourth and fifth MCP joints.  Complete fist formation bilaterally.  Hip joints have good range of motion with no groin pain.  Knee joints have good range of motion with no warmth or effusion.  Ankle joints have good range of motion with no tenderness or synovitis.  CDAI Exam: CDAI Score: 4.8  Patient Global: 4 mm; Provider Global: 4 mm Swollen: 2 ; Tender: 2  Joint Exam 07/15/2022      Right  Left  MCP 4  Swollen Tender     MCP 5  Swollen Tender        Investigation: No additional findings.  Imaging: No results found.  Recent Labs: Lab Results  Component Value Date   WBC 3.1 (L) 07/03/2022   HGB 13.0 07/03/2022   PLT 146 07/03/2022   NA 143 07/03/2022   K 4.4 07/03/2022   CL 106 07/03/2022   CO2 27 07/03/2022   GLUCOSE 82 07/03/2022   BUN 12 07/03/2022   CREATININE 0.78 07/03/2022   BILITOT 0.7 07/03/2022   ALKPHOS 74 04/08/2021   AST 21 07/03/2022   ALT 13 07/03/2022   PROT 8.1  07/03/2022   ALBUMIN 4.5 04/08/2021   CALCIUM 10.1 07/03/2022   GFRAA 89 02/15/2020   QFTBGOLDPLUS NEGATIVE 01/31/2021    Speciality Comments: Dexa- 05/27/18 Osteopenia T-Score -1.7 BMD 0.963  Prior therapy includes: Plaquenil (inadequate response) , Enbrel (injection site reaction) and methotrexate (neutropenia so on low dose).  Procedures:  No procedures performed Allergies: Etanercept, Methotrexate derivatives, and Pregabalin     Assessment / Plan:     Visit Diagnoses: Rheumatoid arthritis involving multiple sites with positive rheumatoid factor ([place]) - +Anti-CCP: Patient presents today with tenderness and synovitis of the right fourth and fifth MCP joints which started last week.  She has been experiencing recurrent iritis flares involving both eyes on a weekly basis.  She has not been taking any immunosuppressive agents.  Her last dose of in office Cimzia was administered on 02/05/2022.  She has been cleared by her endodontist Dr. Sue Lush on 05/26/2022 to reinitiate Cimzia. She has had a gap in therapy while undergoing treatment by her endodontist after a fall in October 2023 requiring a root canal.  She is currently waiting on approval for Cimzia to reinitiate in office administered Cimzia going forward.  TB gold updated today prior to reinitiating Cimzia. A prednisone taper was sent to the pharmacy today to treat her current flare. She is planning to apply for FMLA to help to cover her for frequent appointments and lab monitoring.  A work note was provided today stating that she was seen in the office. She will require lab work 1 month then every 3 months after restarting Cimzia. She will follow-up in the office in 3 months to assess her response to Cimzia.  Iritis - Dr. Teodoro Spray, recurrent iritis-She continues to have recurrent flares alternating between both eyes on a weekly basis.  Planning to reinitiate Cimzia.   High risk medication use - She will be reinitiating in-office  Cimzia-pending TB gold results and insurance approval.  Last dose of Cimzia administered on 02/05/22.   CBC and CMP were drawn on 07/03/2022.  She will require updated lab work 1 month and every 3 months after reinitiating Cimzia.  TB Gold negative on 01/31/2021.  Overdue to update TB Gold.  Order for TB gold released today.  - Plan: QuantiFERON-TB Gold Plus  Screening for tuberculosis -TB gold order released.  Plan: QuantiFERON-TB Gold Plus  Other neutropenia ([place]): White blood cell count was 3.1 and absolute neutrophils 1,342 on 07/03/22.  She will have updated CBC with diff in 1 month after restarting cimzia.   Primary osteoarthritis of both hands: Patient presents today with some tenderness and synovitis in the right second fourth and fifth MCP joints.  She was able to make a complete fist bilaterally.  A prednisone taper sent to pharmacy to treat her current rheumatoid arthritis flare.  Primary osteoarthritis of both feet: She is not experiencing any discomfort in her feet at this time.  She wears proper fitting shoes.  Good range of motion of both ankle joints with no tenderness or synovitis.  DDD (degenerative disc disease), cervical: C-spine has good range of motion with no discomfort currently.  Arthropathy of lumbar facet joint - X-rays obtained on 02/16/2020 were consistent with mild scoliosis and facet joint arthropathy.  She experiences intermittent discomfort in her lower back.  Trochanteric bursitis, right hip: Asymptomatic at this time.  Osteopenia of multiple sites - DEXA updated on 10/28/20: AP spine BMD 0.921 with T-score -1.1.  Statistically significant decrease in BMD of AP spine. Due to update DEXA in June 2024.  Other medical conditions are listed as follows:   History of scoliosis  History of migraine  History of IBS  History of hypothyroidism  History of hypertension: BP was 136/86 today in the office.   History of gastroesophageal reflux (GERD)  History of  bradycardia/ sinoatrial node dysfunction    Orders: Orders Placed This Encounter  Procedures   QuantiFERON-TB Gold Plus   Meds ordered this encounter  Medications   predniSONE (DELTASONE) 5 MG tablet    Sig: Take 4 tablets by mouth daily x4 days, 3 tablets daily x4 days, 2 tablets daily x4 days, 1 tablet daily x4 days.    Dispense:  40 tablet    Refill:  0     Follow-Up Instructions: Return in about 3 months (around 10/13/2022) for Rheumatoid arthritis, Iritis.   Ofilia Neas, PA-C  Note - This record has been created using Dragon software.  Chart creation errors have been sought, but may not always  have been located. Such creation errors do not reflect on  the standard of medical care.

## 2022-07-15 ENCOUNTER — Encounter: Payer: Self-pay | Admitting: Physician Assistant

## 2022-07-15 ENCOUNTER — Ambulatory Visit: Payer: Managed Care, Other (non HMO) | Attending: Physician Assistant | Admitting: Physician Assistant

## 2022-07-15 VITALS — BP 136/86 | HR 56 | Resp 16 | Ht 68.0 in | Wt 133.8 lb

## 2022-07-15 DIAGNOSIS — M5030 Other cervical disc degeneration, unspecified cervical region: Secondary | ICD-10-CM

## 2022-07-15 DIAGNOSIS — M47816 Spondylosis without myelopathy or radiculopathy, lumbar region: Secondary | ICD-10-CM

## 2022-07-15 DIAGNOSIS — H209 Unspecified iridocyclitis: Secondary | ICD-10-CM

## 2022-07-15 DIAGNOSIS — M0579 Rheumatoid arthritis with rheumatoid factor of multiple sites without organ or systems involvement: Secondary | ICD-10-CM

## 2022-07-15 DIAGNOSIS — Z8739 Personal history of other diseases of the musculoskeletal system and connective tissue: Secondary | ICD-10-CM

## 2022-07-15 DIAGNOSIS — Z8719 Personal history of other diseases of the digestive system: Secondary | ICD-10-CM

## 2022-07-15 DIAGNOSIS — M19042 Primary osteoarthritis, left hand: Secondary | ICD-10-CM

## 2022-07-15 DIAGNOSIS — Z79899 Other long term (current) drug therapy: Secondary | ICD-10-CM | POA: Diagnosis not present

## 2022-07-15 DIAGNOSIS — M7061 Trochanteric bursitis, right hip: Secondary | ICD-10-CM

## 2022-07-15 DIAGNOSIS — M19071 Primary osteoarthritis, right ankle and foot: Secondary | ICD-10-CM

## 2022-07-15 DIAGNOSIS — Z111 Encounter for screening for respiratory tuberculosis: Secondary | ICD-10-CM

## 2022-07-15 DIAGNOSIS — Z87898 Personal history of other specified conditions: Secondary | ICD-10-CM

## 2022-07-15 DIAGNOSIS — M19072 Primary osteoarthritis, left ankle and foot: Secondary | ICD-10-CM

## 2022-07-15 DIAGNOSIS — D708 Other neutropenia: Secondary | ICD-10-CM

## 2022-07-15 DIAGNOSIS — M8589 Other specified disorders of bone density and structure, multiple sites: Secondary | ICD-10-CM

## 2022-07-15 DIAGNOSIS — Z8679 Personal history of other diseases of the circulatory system: Secondary | ICD-10-CM

## 2022-07-15 DIAGNOSIS — Z8639 Personal history of other endocrine, nutritional and metabolic disease: Secondary | ICD-10-CM

## 2022-07-15 DIAGNOSIS — Z8669 Personal history of other diseases of the nervous system and sense organs: Secondary | ICD-10-CM

## 2022-07-15 DIAGNOSIS — M19041 Primary osteoarthritis, right hand: Secondary | ICD-10-CM

## 2022-07-15 MED ORDER — PREDNISONE 5 MG PO TABS: ORAL_TABLET | ORAL | 0 refills | Status: DC

## 2022-07-15 NOTE — Telephone Encounter (Signed)
Patient stated she was concerned about what the cost would be for the Cimzia as she will be restarting today. Explained that she will likely receive a bill in the mail for the medication, however, she should reach out to our office at that time and not pay the bill. Explained that she is enrolled in a savings card program that should cover these costs.   Maryan Puls, PharmD PGY-1 [facility] Resident

## 2022-07-17 LAB — QUANTIFERON-TB GOLD PLUS
Mitogen-NIL: 8.15 IU/mL
NIL: 0.04 IU/mL
QuantiFERON-TB Gold Plus: NEGATIVE
TB1-NIL: <0 IU/mL
TB2-NIL: <0 IU/mL

## 2022-07-19 NOTE — Progress Notes (Signed)
TB Gold negative

## 2022-07-21 LAB — VITAMIN D 25 HYDROXY (VIT D DEFICIENCY, FRACTURES): Vit D, 25-Hydroxy: 44 ng/mL (ref 30–100)

## 2022-07-21 LAB — LIPID PANEL
Cholesterol: 155 mg/dL (ref <200)
HDL: 69 mg/dL (ref >=50)
LDL Cholesterol (Calc): 73 mg/dL (calc)
Non-HDL Cholesterol (Calc): 86 mg/dL (calc) (ref <130)
Total CHOL/HDL Ratio: 2.2 (calc) (ref <5.0)
Triglycerides: 57 mg/dL (ref <150)

## 2022-07-21 LAB — HEMOGLOBIN A1C
Hgb A1c MFr Bld: 6 % of total Hgb — ABNORMAL HIGH (ref <5.7)
Mean Plasma Glucose: 126 mg/dL
eAG (mmol/L): 7 mmol/L

## 2022-07-21 LAB — COMPREHENSIVE METABOLIC PANEL
AG Ratio: 1.1 (calc) (ref 1.0–2.5)
ALT: 13 U/L (ref 6–29)
AST: 21 U/L (ref 10–35)
Albumin: 4.3 g/dL (ref 3.6–5.1)
Alkaline phosphatase (APISO): 68 U/L (ref 37–153)
BUN: 12 mg/dL (ref 7–25)
CO2: 27 mmol/L (ref 20–32)
Calcium: 10.1 mg/dL (ref 8.6–10.4)
Chloride: 106 mmol/L (ref 98–110)
Creat: 0.78 mg/dL (ref 0.50–1.03)
Globulin: 3.8 g/dL (calc) — ABNORMAL HIGH (ref 1.9–3.7)
Glucose, Bld: 82 mg/dL (ref 65–99)
Potassium: 4.4 mmol/L (ref 3.5–5.3)
Sodium: 143 mmol/L (ref 135–146)
Total Bilirubin: 0.7 mg/dL (ref 0.2–1.2)
Total Protein: 8.1 g/dL (ref 6.1–8.1)

## 2022-07-21 LAB — CBC WITH DIFFERENTIAL/PLATELET
Absolute Monocytes: 434 cells/uL (ref 200–950)
Basophils Absolute: 22 cells/uL (ref 0–200)
Basophils Relative: 0.7 %
Eosinophils Absolute: 31 cells/uL (ref 15–500)
Eosinophils Relative: 1 %
HCT: 40.3 % (ref 35.0–45.0)
Hemoglobin: 13 g/dL (ref 11.7–15.5)
Lymphs Abs: 1271 cells/uL (ref 850–3900)
MCH: 28.4 pg (ref 27.0–33.0)
MCHC: 32.3 g/dL (ref 32.0–36.0)
MCV: 88.2 fL (ref 80.0–100.0)
MPV: 12.5 fL (ref 7.5–12.5)
Monocytes Relative: 14 %
Neutro Abs: 1342 cells/uL — ABNORMAL LOW (ref 1500–7800)
Neutrophils Relative %: 43.3 %
Platelets: 146 10*3/uL (ref 140–400)
RBC: 4.57 10*6/uL (ref 3.80–5.10)
RDW: 12 % (ref 11.0–15.0)
Total Lymphocyte: 41 %
WBC: 3.1 10*3/uL — ABNORMAL LOW (ref 3.8–10.8)

## 2022-07-21 LAB — ZINC: Zinc: 64 ug/dL (ref 60–130)

## 2022-07-21 LAB — TSH: TSH: 4.55 mIU/L — ABNORMAL HIGH (ref 0.40–4.50)

## 2022-07-21 LAB — T4, FREE: Free T4: 1 ng/dL (ref 0.8–1.8)

## 2022-07-21 LAB — MAGNESIUM: Magnesium: 1.9 mg/dL (ref 1.5–2.5)

## 2022-07-28 ENCOUNTER — Encounter: Payer: Self-pay | Admitting: General Practice

## 2022-08-04 ENCOUNTER — Encounter: Payer: Self-pay | Admitting: Internal Medicine

## 2022-08-04 ENCOUNTER — Ambulatory Visit: Payer: Managed Care, Other (non HMO) | Attending: Internal Medicine | Admitting: Internal Medicine

## 2022-08-04 VITALS — BP 146/90 | HR 46 | Ht 69.0 in | Wt 135.0 lb

## 2022-08-04 DIAGNOSIS — R001 Bradycardia, unspecified: Secondary | ICD-10-CM | POA: Diagnosis not present

## 2022-08-04 DIAGNOSIS — I495 Sick sinus syndrome: Secondary | ICD-10-CM | POA: Diagnosis not present

## 2022-08-04 MED ORDER — AMLODIPINE BESYLATE 5 MG PO TABS: 5.0000 mg | ORAL_TABLET | Freq: Every day | ORAL | 3 refills | Status: DC

## 2022-08-04 NOTE — Progress Notes (Signed)
Patient Care Team: Brittany Branch, MD as PCP - General (Internal Medicine) Brittany Sprang, MD as Consulting Physician (Cardiology) Brittany Merino, MD as Consulting Physician (Rheumatology) Brittany Ores, MD as Referring Physician (Obstetrics and Gynecology)   HPI  Brittany Hodges is a 57 y.o. female is seen in followup for junctional rhythm in the setting of atrial septal defect repair. In the past she has had assessments of chrontropic competence which have been adequate.  She does have mild limitations but has problems with exercise.   Hospitalized in the past for syncope.  Thought by [facility] and by me subsequently to probably be neurally mediated.  Event recorder 9/23 undertaken for palpitations demonstrated nonsustained atrial tachycardia unassociated with triggering, triggers were associated with sinus rhythm   GXT 9/20 Max HR 155   The patient denies chest pain, shortness of breath, nocturnal dyspnea, orthopnea or peripheral edema.  There have been no palpitations, lightheadedness or syncope.    Her mother has subsequently passed away, her father passed away her brother died.  She took care of her mother for 3 years, got off of all of her diabetes medicines, did great great job.  Shared with her Brittany Hodges's book it related  DATE TEST EF   11/15 Echo   50-55 %   5/23 CaScore  CaScore = 0                 Date Cr K Hgb  11/18/ 0.79 3.9   2/24 0.78 4.4 13.0       Past Medical History:  Diagnosis Date   Allergic rhinitis, cause unspecified    Anemia    Asthma    Atrial septal defect    s/p repair;   Echo 08/07/10: EF 55-60%; mild MR; atrial septal aneurysm; no residual ASD   Chest pain, unspecified    cardiac cath 07/31/10: normal coronaries; vigorous LVF   Esophageal reflux    Fibromyalgia    Headache(784.0)    Hematuria, unspecified    Hypertension    no meds    Hypothyroidism    Irritable bowel syndrome    Low back pain     Rheumatoid arthritis(714.0)    Dr Herold Harms   Scoliosis    Sinoatrial node dysfunction ([facility])    eval. for chronotropic competence completed in past    Past Surgical History:  Procedure Laterality Date   ASD REPAIR  1985   TONSILLECTOMY AND ADENOIDECTOMY     TUBAL LIGATION      Current Outpatient Medications  Medication Sig Dispense Refill   amLODipine (NORVASC) 2.5 MG tablet TAKE 1 TABLET BY MOUTH EVERY DAY 90 tablet 3   amoxicillin (AMOXIL) 500 MG capsule Take 2,000 capsules by mouth once.     BLACK CURRANT SEED OIL PO Take by mouth daily.     Carboxymethylcellulose Sodium (EYE DROPS OP) Apply to eye daily.     certolizumab pegol (CIMZIA) 2 X 200 MG KIT Inject 400 mg into the skin every 28 (twenty-eight) days. In-office injections (Patient not taking: Reported on 07/15/2022)     Cholecalciferol (VITAMIN D) 50 MCG (2000 UT) tablet Take 2,000 Units by mouth daily.     meclizine (ANTIVERT) 12.5 MG tablet Take 1 tablet (12.5 mg total) by mouth 3 (three) times daily as needed for dizziness. 30 tablet 0   prednisoLONE acetate (PRED FORTE) 1 % ophthalmic suspension Place 1 drop into both eyes daily.     predniSONE (DELTASONE) 5  MG tablet Take 4 tablets by mouth daily x4 days, 3 tablets daily x4 days, 2 tablets daily x4 days, 1 tablet daily x4 days. 40 tablet 0   No current facility-administered medications for this visit.    Allergies  Allergen Reactions   Etanercept    Methotrexate Derivatives     Caused her WBC to drop   Pregabalin     Review of Systems negative except from HPI and PMH  Physical Exam BP (!) 146/90   Pulse (!) 46   Ht 5\' 9"  (1.753 m)   Wt 135 lb (61.2 kg)   LMP 05/01/2016   SpO2 98%   BMI 19.94 kg/m  Well developed and well nourished in no acute distress HENT normal Neck supple with JVP-flat Clear Device pocket well healed; without hematoma or erythema.  There is no tethering  Regular rate and rhythm, no  gallop No  murmur Abd-soft with active BS No  Clubbing cyanosis  edema Skin-warm and dry A & Oriented  Grossly normal sensory and motor function  ECG sinus at 46 Will 16/08/44    ECGs from 2015--2020 reviewed and ST changes are new    Assessment and  Plan  Sinus node dysfunction  Syncope  Atrial septal defect repair   Hypertension  Abnormal ECG    Blood pressure significantly elevated.  We will increase the amlodipine from 2.5--5 mg daily.  Exercise tolerance is good.  No interval syncope.  Discussed the loss of her mom and the rest of her family members.  Gave her [facility] book

## 2022-08-04 NOTE — Patient Instructions (Signed)
Medication Instructions:  Your physician has recommended you make the following change in your medication:   ** Stop Amlodipine 2.5mg   ** Begin Amlodipine 5mg  - 1 tablet by mouth daily  *If you need a refill on your cardiac medications before your next appointment, please call your pharmacy*   Lab Work: None ordered.  If you have labs (blood work) drawn today and your tests are completely normal, you will receive your results only by: [place] (if you have MyChart) OR A paper copy in the mail If you have any lab test that is abnormal or we need to change your treatment, we will call you to review the results.   Testing/Procedures: None ordered.    Follow-Up: At [facility], you and your health needs are our priority.  As part of our continuing mission to provide you with exceptional heart care, we have created designated Provider Care Teams.  These Care Teams include your primary Cardiologist (physician) and Advanced Practice Providers (APPs -  Physician Assistants and Nurse Practitioners) who all work together to provide you with the care you need, when you need it.  We recommend signing up for the patient portal called "MyChart".  Sign up information is provided on this After Visit Summary.  MyChart is used to connect with patients for Virtual Visits (Telemedicine).  Patients are able to view lab/test results, encounter notes, upcoming appointments, etc.  Non-urgent messages can be sent to your provider as well.   To learn more about what you can do with MyChart, go to [email or web address].    Your next appointment:   12 months with Dr Caryl Comes

## 2022-08-05 ENCOUNTER — Encounter: Payer: Self-pay | Admitting: *Deleted

## 2022-08-05 ENCOUNTER — Telehealth: Payer: Self-pay | Admitting: Internal Medicine

## 2022-08-05 NOTE — Telephone Encounter (Signed)
  Pt requesting to get a note from Dr. Caryl Comes that she was seen yesterday. She said, to fax it to her at  [phone]

## 2022-08-06 NOTE — Telephone Encounter (Signed)
Spoke with pt and advised letter has been faxed as requested.  Please see letter for complete details.

## 2022-08-20 ENCOUNTER — Ambulatory Visit: Payer: Managed Care, Other (non HMO)

## 2022-08-20 NOTE — Progress Notes (Unsigned)
Office Visit Note  Patient: Brittany Hodges             Date of Birth: 03/21/1966           MRN: [id number]             PCP: [name], MD Referring: [name], MD Visit Date: 09/03/2022 Occupation: @GUAROCC @  Subjective:  Restart cimzia   History of Present Illness: Brittany Hodges is a 57 y.o. female with history of seropositive rheumatoid arthritis.  Patient presents today to restart on in office administered Cimzia.  Her last dose of Cimzia was administered on 02/05/2022.  Patient states she continues to have recurrent flares of iritis alternating between both eyes.  She is currently being treated for a flare with prednisolone eyedrops prescribed by Dr. Hyacinth Meeker.  Patient is also experiencing a flare in her right knee.  She is having warmth and swelling in the right knee for the past few days.  She continues to exercise on a regular basis.  Patient will be filing for FMLA and will be sending paperwork from both our office and her ophthalmologist office.    Activities of Daily Living:  Patient reports morning stiffness for 0 minutes.   Patient Denies nocturnal pain.  Difficulty dressing/grooming: Denies Difficulty climbing stairs: Denies Difficulty getting out of chair: Denies Difficulty using hands for taps, buttons, cutlery, and/or writing: Denies  Review of Systems  Constitutional:  Negative for fatigue.  HENT:  Negative for mouth sores and mouth dryness.   Eyes:  Positive for pain, redness and visual disturbance. Negative for dryness.  Respiratory:  Negative for shortness of breath.   Cardiovascular:  Negative for chest pain and palpitations.  Gastrointestinal:  Negative for blood in stool, constipation and diarrhea.  Endocrine: Negative for increased urination.  Genitourinary:  Negative for involuntary urination.  Musculoskeletal:  Positive for joint pain and joint pain. Negative for gait problem, joint swelling, myalgias, muscle weakness, morning stiffness,  muscle tenderness and myalgias.  Skin:  Negative for color change, rash, hair loss and sensitivity to sunlight.  Allergic/Immunologic: Negative for susceptible to infections.  Neurological:  Negative for dizziness and headaches.  Hematological:  Negative for swollen glands.  Psychiatric/Behavioral:  Negative for depressed mood and sleep disturbance. The patient is not nervous/anxious.     PMFS History:  Patient Active Problem List   Diagnosis Date Noted   Osteopenia 01/27/2022   Bradycardia 03/06/2021   Benign paroxysmal positional vertigo 03/06/2021   Mild intermittent asthma 12/22/2020   Chronic sinusitis 09/14/2019   Intolerance, food 09/14/2019   Asthmatic bronchitis 04/06/2016   Laryngopharyngeal reflux (LPR) 04/06/2016   PCP NOTES >>>>> 03/09/2015   Annual physical exam 12/21/2014   Cardiomegaly, by MRI 03/16/2014   Leukopenia 03/10/2014   Dizziness 02/10/2011   SINOATRIAL NODE DYSFUNCTION 08/06/2008   ATRIAL SEPTAL DEFECT 06/29/2007   Hypothyroidism 03/21/2007   Essential hypertension 03/21/2007   Perennial allergic rhinitis 03/21/2007   GERD-- dysphagia 03/21/2007   CONSTIPATION 03/21/2007   IRRITABLE BOWEL SYNDROME 03/21/2007   RA (rheumatoid arthritis) 03/21/2007   LOW BACK PAIN 03/21/2007   FIBROMYALGIA 03/21/2007    Past Medical History:  Diagnosis Date   Allergic rhinitis, cause unspecified    Anemia    Asthma    Atrial septal defect    s/p repair;   Echo 08/07/10: EF 55-60%; mild MR; atrial septal aneurysm; no residual ASD   Chest pain, unspecified    cardiac cath 07/31/10: normal coronaries; vigorous LVF  Esophageal reflux    Fibromyalgia    Headache(784.0)    Hematuria, unspecified    Hypertension    no meds    Hypothyroidism    Irritable bowel syndrome    Low back pain    Rheumatoid arthritis(714.0)    Dr Victory Dakin   Scoliosis    Sinoatrial node dysfunction    eval. for chronotropic competence completed in past    Family History  Problem  Relation Age of Onset   Hypertension Mother    Diabetes Mother    Kidney failure Mother    Glaucoma Father    Hypertension Sister    Thyroid disease Sister    Hypertension Sister    Thyroid disease Sister    Heart disease Maternal Grandmother    Stomach cancer Maternal Grandmother 95   Rheum arthritis Maternal Grandmother    Sinusitis Daughter    Asthma Son    Eczema Son    Breast cancer Other        aunt   Diabetes Other        GM   Colon cancer Neg Hx    Allergic rhinitis Neg Hx    Angioedema Neg Hx    Immunodeficiency Neg Hx    Past Surgical History:  Procedure Laterality Date   ASD REPAIR  1985   TONSILLECTOMY AND ADENOIDECTOMY     TUBAL LIGATION     Social History   Social History Narrative   Household-- lives by herself   Immunization History  Administered Date(s) Administered   Influenza Split 03/17/2011, 03/17/2012   Influenza Whole 04/05/2009, 02/21/2010   Influenza, Seasonal, Injecte, Preservative Fre 03/11/2021   Influenza,inj,Quad PF,6+ Mos 03/21/2013, 03/09/2014, 05/24/2015, 01/27/2016   Influenza-Unspecified 07/30/2020, 02/17/2022   PFIZER(Purple Top)SARS-COV-2 Vaccination 12/16/2019, 01/16/2020   Pneumococcal Conjugate-13 01/27/2016   Pneumococcal Polysaccharide-23 03/08/2015   Td 07/23/2010   Tdap 05/28/2020, 02/25/2022     Objective: Vital Signs: BP 134/76 (BP Location: Left Arm, Patient Position: Sitting, Cuff Size: Normal)   Pulse (!) 48   Resp 14   Ht 5\' 8"  (1.727 m)   Wt 135 lb 12.8 oz (61.6 kg)   LMP 05/01/2016   BMI 20.65 kg/m    Physical Exam Vitals and nursing note reviewed.  Constitutional:      Appearance: She is well-developed.  HENT:     Head: Normocephalic and atraumatic.  Eyes:     Conjunctiva/sclera: Conjunctivae normal.  Cardiovascular:     Rate and Rhythm: Normal rate and regular rhythm.     Heart sounds: Normal heart sounds.  Pulmonary:     Effort: Pulmonary effort is normal.     Breath sounds: Normal breath  sounds.  Abdominal:     General: Bowel sounds are normal.     Palpations: Abdomen is soft.  Musculoskeletal:     Cervical back: Normal range of motion.  Lymphadenopathy:     Cervical: No cervical adenopathy.  Skin:    General: Skin is warm and dry.     Capillary Refill: Capillary refill takes less than 2 seconds.  Neurological:     Mental Status: She is alert and oriented to person, place, and time.  Psychiatric:        Behavior: Behavior normal.      Musculoskeletal Exam: C-spine has slightly limited range of motion without rotation.  Shoulder joints, elbow joints, wrist joints, MCPs, PIPs, DIPs have good range of motion with no synovitis.  Complete fist formation bilaterally.  Hip joints have good range of  motion with no groin pain.  Warmth and swelling noted in the right knee.  Left knee has full range of motion with no warmth or effusion.  Ankle joints have good range of motion with no tenderness or joint swelling.  No tenderness or synovitis over MTP joints.  CDAI Exam: CDAI Score: 2.8  Patient Global: 4 mm; Provider Global: 4 mm Swollen: 1 ; Tender: 1  Joint Exam 09/03/2022      Right  Left  Knee  Swollen Tender        Investigation: No additional findings.  Imaging: XR KNEE 3 VIEW RIGHT  Result Date: 09/03/2022 No medial or lateral compartment narrowing was noted.  No patellofemoral narrowing was noted. Impression: Unremarkable x-rays of the knee.   Recent Labs: Lab Results  Component Value Date   WBC 3.1 (L) 07/03/2022   HGB 13.0 07/03/2022   PLT 146 07/03/2022   NA 143 07/03/2022   K 4.4 07/03/2022   CL 106 07/03/2022   CO2 27 07/03/2022   GLUCOSE 82 07/03/2022   BUN 12 07/03/2022   CREATININE 0.78 07/03/2022   BILITOT 0.7 07/03/2022   ALKPHOS 74 04/08/2021   AST 21 07/03/2022   ALT 13 07/03/2022   PROT 8.1 07/03/2022   ALBUMIN 4.5 04/08/2021   CALCIUM 10.1 07/03/2022   GFRAA 89 02/15/2020   QFTBGOLDPLUS NEGATIVE 07/15/2022    Speciality  Comments: Dexa- 05/27/18 Osteopenia T-Score -1.7 BMD 0.963  Prior therapy includes: Plaquenil (inadequate response) , Enbrel (injection site reaction) and methotrexate (neutropenia so on low dose).  Procedures:  Large Joint Inj: R knee on 09/03/2022 3:49 PM Indications: pain Details: 27 G 1.5 in needle, medial approach  Arthrogram: No  Medications: 1.5 mL lidocaine 1 %; 40 mg triamcinolone acetonide 40 MG/ML Aspirate: 0 mL Outcome: tolerated well, no immediate complications Procedure, treatment alternatives, risks and benefits explained, specific risks discussed. Consent was given by the patient. Immediately prior to procedure a time out was called to verify the correct patient, procedure, equipment, support staff and site/side marked as required. Patient was prepped and draped in the usual sterile fashion.     Allergies: Etanercept, Methotrexate derivatives, and Pregabalin    Assessment / Plan:     Visit Diagnoses: Rheumatoid arthritis involving multiple sites with positive rheumatoid factor - +Anti-CCP: Patient presents today experiencing a flare in her right knee joint which started several days ago.  On examination warmth and swelling was noted in the right knee.  X-rays of the right knee were obtained today which were unremarkable.  After informed consent the right knee joint was injected with cortisone and the procedure note was completed above.  She will be reinitiating Cimzia today in the office for management of rheumatoid arthritis and recurrent iritis. She is aware that she will require frequent lab monitoring.  She will follow-up in the office in 3 months or sooner if needed for an office visit. FMLA paperwork will be filled out to cover her for frequent flares (2 flares per month lasting up to 2 days per flare) and for monthly office visits for clinic administered Cimzia/lab work.  Iritis - Dr. Jimmye Norman, recurrent iritis: She continues to have recurrent iritis flares  alternating between both eyes.  She is currently on a prednisolone eyedrop taper prescribed by her ophthalmologist.  She will be reinitiating Cimzia today as discussed above.  High risk medication use - Reinitiating in-office administered Cimzia today on 09/03/2022.  Cimzia will be administered monthly in the office. CBC and  CMP updated on 07/03/22.  Orders for CBC and CMP were released today.  She will require close lab monitoring given history of neutropenia. TB gold negative on 07/15/22.  Discussed the importance of holding cimzia if she develops signs or symptoms of an infection and to resume once the infection has completely cleared.  - Plan: CBC with Differential/Platelet, COMPLETE METABOLIC PANEL WITH GFR  Other neutropenia -White blood cell count was 3.1 and absolute neutrophils were 1342 on 07/03/2022.  Orders for CBC and CMP were released today.  She will require frequent lab monitoring given history of neutropenia.  Plan: CBC with Differential/Platelet  Primary osteoarthritis of both hands: No tenderness or synovitis noted today.  Primary osteoarthritis of both feet: Good range of motion of both ankle joints with no tenderness or synovitis.  She recently had a flare in her right midfoot which has resolved.  She has no tenderness or synovitis over the MTP joints.  Chronic pain of right knee -She presents today with increased pain and inflammation involving the right knee which started several days ago.  No injury prior to the onset of symptoms.  She has been exercising on a regular basis.  On examination she has discomfort the range of motion with some warmth and swelling in the right knee.  X-rays of the right knee were updated today which were unremarkable.  After informed consent the right knee joint was injected with cortisone.  She tolerated procedure well.  Procedure note was completed above.  Aftercare was discussed.  She was advised to notify us if her symptoms persist or worsen.  Cimzia  was reinitiated today in the office.  Plan: XR KNEE 3 VIEW RIGHT, Large Joint Inj: R knee  DDD (degenerative disc disease), cervical: Slightly limited ROM with lateral rotation.   Arthropathy of lumbar facet joint:  X-rays obtained on 02/16/2020 were consistent with mild scoliosis and facet joint arthropathy.  She experiences intermittent discomfort in her lower back.   Trochanteric bursitis, right hip: Resolved.  Osteopenia of multiple sites: DEXA updated on 10/28/20: AP spine BMD 0.921 with T-score -1.1.  Statistically significant decrease in BMD of AP spine. Due to update DEXA in June 2024.  Other medical conditions are listed as follows:   History of scoliosis  History of migraine  History of IBS  History of hypothyroidism  History of hypertension: Blood pressure was 134/76 today in the office.  Advised patient to monitor blood pressure closely following the cortisone injection today.  History of gastroesophageal reflux (GERD)  History of bradycardia/ sinoatrial node dysfunction    Orders: Orders Placed This Encounter  Procedures   Large Joint Inj: R knee   XR KNEE 3 VIEW RIGHT   CBC with Differential/Platelet   COMPLETE METABOLIC PANEL WITH GFR   Meds ordered this encounter  Medications   certolizumab pegol (CIMZIA) kit 400 mg     Follow-Up Instructions: Return in about 3 months (around 12/03/2022) for Rheumatoid arthritis, Uveitis.   Gearldine Bienenstock, PA-C  Note - This record has been created using Dragon software.  Chart creation errors have been sought, but may not always  have been located. Such creation errors do not reflect on  the standard of medical care.

## 2022-09-03 ENCOUNTER — Ambulatory Visit: Payer: Managed Care, Other (non HMO)

## 2022-09-03 ENCOUNTER — Ambulatory Visit: Payer: Managed Care, Other (non HMO) | Attending: Physician Assistant | Admitting: Physician Assistant

## 2022-09-03 ENCOUNTER — Encounter: Payer: Self-pay | Admitting: Physician Assistant

## 2022-09-03 ENCOUNTER — Encounter: Payer: Self-pay | Admitting: *Deleted

## 2022-09-03 VITALS — BP 134/76 | HR 48 | Resp 14 | Ht 68.0 in | Wt 135.8 lb

## 2022-09-03 DIAGNOSIS — G8929 Other chronic pain: Secondary | ICD-10-CM | POA: Diagnosis not present

## 2022-09-03 DIAGNOSIS — Z8639 Personal history of other endocrine, nutritional and metabolic disease: Secondary | ICD-10-CM

## 2022-09-03 DIAGNOSIS — M19042 Primary osteoarthritis, left hand: Secondary | ICD-10-CM

## 2022-09-03 DIAGNOSIS — H209 Unspecified iridocyclitis: Secondary | ICD-10-CM | POA: Diagnosis not present

## 2022-09-03 DIAGNOSIS — M25561 Pain in right knee: Secondary | ICD-10-CM

## 2022-09-03 DIAGNOSIS — M5030 Other cervical disc degeneration, unspecified cervical region: Secondary | ICD-10-CM

## 2022-09-03 DIAGNOSIS — M8589 Other specified disorders of bone density and structure, multiple sites: Secondary | ICD-10-CM

## 2022-09-03 DIAGNOSIS — D708 Other neutropenia: Secondary | ICD-10-CM

## 2022-09-03 DIAGNOSIS — M19041 Primary osteoarthritis, right hand: Secondary | ICD-10-CM

## 2022-09-03 DIAGNOSIS — Z79899 Other long term (current) drug therapy: Secondary | ICD-10-CM | POA: Diagnosis not present

## 2022-09-03 DIAGNOSIS — Z8679 Personal history of other diseases of the circulatory system: Secondary | ICD-10-CM

## 2022-09-03 DIAGNOSIS — M7061 Trochanteric bursitis, right hip: Secondary | ICD-10-CM

## 2022-09-03 DIAGNOSIS — M0579 Rheumatoid arthritis with rheumatoid factor of multiple sites without organ or systems involvement: Secondary | ICD-10-CM | POA: Diagnosis not present

## 2022-09-03 DIAGNOSIS — M19072 Primary osteoarthritis, left ankle and foot: Secondary | ICD-10-CM

## 2022-09-03 DIAGNOSIS — M47816 Spondylosis without myelopathy or radiculopathy, lumbar region: Secondary | ICD-10-CM

## 2022-09-03 DIAGNOSIS — Z8719 Personal history of other diseases of the digestive system: Secondary | ICD-10-CM

## 2022-09-03 DIAGNOSIS — Z87898 Personal history of other specified conditions: Secondary | ICD-10-CM

## 2022-09-03 DIAGNOSIS — Z8669 Personal history of other diseases of the nervous system and sense organs: Secondary | ICD-10-CM

## 2022-09-03 DIAGNOSIS — Z8739 Personal history of other diseases of the musculoskeletal system and connective tissue: Secondary | ICD-10-CM

## 2022-09-03 DIAGNOSIS — M19071 Primary osteoarthritis, right ankle and foot: Secondary | ICD-10-CM

## 2022-09-03 MED ORDER — LIDOCAINE HCL 1 % IJ SOLN
1.5000 mL | INTRAMUSCULAR | Status: AC | PRN
  Administered 2022-09-03: 1.5 mL

## 2022-09-03 MED ORDER — CERTOLIZUMAB PEGOL 2 X 200 MG ~~LOC~~ KIT
400.0000 mg | PACK | Freq: Once | SUBCUTANEOUS | Status: AC
  Administered 2022-09-03: 400 mg via SUBCUTANEOUS

## 2022-09-03 MED ORDER — TRIAMCINOLONE ACETONIDE 40 MG/ML IJ SUSP
40.0000 mg | INTRAMUSCULAR | Status: AC | PRN
  Administered 2022-09-03: 40 mg via INTRA_ARTICULAR

## 2022-09-03 NOTE — Progress Notes (Signed)
Subjective:   Patient presents to clinic today to receive monthly dose of Cimzia.  Patient running a fever or have signs/symptoms of infection? No  Patient currently on antibiotics for the treatment of infection? No  Patient have any upcoming invasive procedures/surgeries? No  Objective: CMP     Component Value Date/Time   NA 143 07/03/2022 1412   NA 144 04/08/2021 1216   K 4.4 07/03/2022 1412   CL 106 07/03/2022 1412   CO2 27 07/03/2022 1412   GLUCOSE 82 07/03/2022 1412   BUN 12 07/03/2022 1412   BUN 15 04/08/2021 1216   CREATININE 0.78 07/03/2022 1412   CALCIUM 10.1 07/03/2022 1412   PROT 8.1 07/03/2022 1412   PROT 7.9 04/08/2021 1216   ALBUMIN 4.5 04/08/2021 1216   AST 21 07/03/2022 1412   AST 21 02/18/2021 1452   ALT 13 07/03/2022 1412   ALT 13 02/18/2021 1452   ALKPHOS 74 04/08/2021 1216   BILITOT 0.7 07/03/2022 1412   BILITOT 0.3 04/08/2021 1216   BILITOT 0.4 02/18/2021 1452   GFRNONAA >60 01/20/2022 1032   GFRNONAA 53 (L) 02/18/2021 1452   GFRNONAA 77 02/15/2020 1023   GFRAA 89 02/15/2020 1023    CBC    Component Value Date/Time   WBC 3.1 (L) 07/03/2022 1412   RBC 4.57 07/03/2022 1412   HGB 13.0 07/03/2022 1412   HGB 12.3 04/08/2021 1216   HGB 11.2 (L) 07/26/2013 0802   HCT 40.3 07/03/2022 1412   HCT 39.3 04/08/2021 1216   HCT 35.5 07/26/2013 0802   PLT 146 07/03/2022 1412   PLT 140 (L) 04/08/2021 1216   MCV 88.2 07/03/2022 1412   MCV 90 04/08/2021 1216   MCV 86 07/26/2013 0802   MCH 28.4 07/03/2022 1412   MCHC 32.3 07/03/2022 1412   RDW 12.0 07/03/2022 1412   RDW 12.0 04/08/2021 1216   RDW 13.6 07/26/2013 0802   LYMPHSABS 1,271 07/03/2022 1412   LYMPHSABS 2.0 04/08/2021 1216   LYMPHSABS 0.9 07/26/2013 0802   MONOABS 0.5 02/18/2021 1452   EOSABS 31 07/03/2022 1412   EOSABS 0.0 04/08/2021 1216   EOSABS 0.0 07/26/2013 0802   BASOSABS 22 07/03/2022 1412   BASOSABS 0.0 04/08/2021 1216   BASOSABS 0.0 07/26/2013 0802    Baseline  Immunosuppressant Therapy Labs TB GOLD    Latest Ref Rng & Units 07/15/2022    3:41 PM  Quantiferon TB Gold  Quantiferon TB Gold Plus NEGATIVE NEGATIVE    Hepatitis Panel    Latest Ref Rng & Units 06/23/2018    3:51 PM  Hepatitis  Hep B Surface Ag NON-REACTI NON-REACTIVE   Hep B IgM NON-REACTI NON-REACTIVE   Hep C Ab NON-REACTI NON-REACTIVE   Hep A IgM NON-REACTI NON-REACTIVE    HIV Lab Results  Component Value Date   HIV NONREACTIVE 12/21/2014   Immunoglobulins    Latest Ref Rng & Units 06/23/2018    3:51 PM  Immunoglobulin Electrophoresis  IgA  47 - 310 mg/dL 161   IgG 096 - 0,454 mg/dL 0,981   IgM 50 - 191 mg/dL 478    SPEP    Latest Ref Rng & Units 07/03/2022    2:12 PM  Serum Protein Electrophoresis  Total Protein 6.1 - 8.1 g/dL 8.1    G9FA No results found for: "G6PDH" TPMT No results found for: "TPMT"   Chest x-ray: 09/16/20-Stable exam without acute or active cardiopulmonary disease.   Assessment/Plan:   Administrations This Visit     certolizumab pegol (  CIMZIA) kit 400 mg     Admin Date 09/03/2022 Action Given Dose 400 mg Route Subcutaneous Administered By Henriette Combs, LPN             Patient tolerated injection well.   Appointment for next injection scheduled for 10/01/2022.  Patient due for labs in July 2024.  Patient is to call and reschedule appointment if running a fever with signs/symptoms of infection, on antibiotics for active infection or has an upcoming invasive procedure.  All questions encouraged and answered.  Instructed patient to call with any further questions or concerns.

## 2022-09-04 LAB — CBC WITH DIFFERENTIAL/PLATELET
Absolute Monocytes: 498 cells/uL (ref 200–950)
Basophils Absolute: 21 cells/uL (ref 0–200)
Basophils Relative: 0.7 %
Eosinophils Absolute: 42 cells/uL (ref 15–500)
Eosinophils Relative: 1.4 %
HCT: 38.1 % (ref 35.0–45.0)
Hemoglobin: 12.3 g/dL (ref 11.7–15.5)
Lymphs Abs: 1206 cells/uL (ref 850–3900)
MCH: 28.3 pg (ref 27.0–33.0)
MCHC: 32.3 g/dL (ref 32.0–36.0)
MCV: 87.8 fL (ref 80.0–100.0)
MPV: 12.7 fL — ABNORMAL HIGH (ref 7.5–12.5)
Monocytes Relative: 16.6 %
Neutro Abs: 1233 cells/uL — ABNORMAL LOW (ref 1500–7800)
Neutrophils Relative %: 41.1 %
Platelets: 140 10*3/uL (ref 140–400)
RBC: 4.34 10*6/uL (ref 3.80–5.10)
RDW: 12 % (ref 11.0–15.0)
Total Lymphocyte: 40.2 %
WBC: 3 10*3/uL — ABNORMAL LOW (ref 3.8–10.8)

## 2022-09-04 LAB — COMPLETE METABOLIC PANEL WITH GFR
AG Ratio: 1.3 (calc) (ref 1.0–2.5)
ALT: 10 U/L (ref 6–29)
AST: 20 U/L (ref 10–35)
Albumin: 4.4 g/dL (ref 3.6–5.1)
Alkaline phosphatase (APISO): 62 U/L (ref 37–153)
BUN: 9 mg/dL (ref 7–25)
CO2: 28 mmol/L (ref 20–32)
Calcium: 9.9 mg/dL (ref 8.6–10.4)
Chloride: 107 mmol/L (ref 98–110)
Creat: 0.82 mg/dL (ref 0.50–1.03)
Globulin: 3.4 g/dL (calc) (ref 1.9–3.7)
Glucose, Bld: 69 mg/dL (ref 65–99)
Potassium: 3.9 mmol/L (ref 3.5–5.3)
Sodium: 142 mmol/L (ref 135–146)
Total Bilirubin: 0.6 mg/dL (ref 0.2–1.2)
Total Protein: 7.8 g/dL (ref 6.1–8.1)
eGFR: 83 mL/min/{1.73_m2} (ref >=60)

## 2022-09-04 NOTE — Progress Notes (Signed)
CMP WNL  WBC count remains low-3.0.  absolute neutrophils remain low but stable.   She will require close lab monitoring--recheck CBC with diff in 1 month.

## 2022-09-08 ENCOUNTER — Telehealth: Payer: Self-pay | Admitting: *Deleted

## 2022-09-08 NOTE — Telephone Encounter (Signed)
Patient advised FMLA paperwork has been completed and a copy of paperwork is at front desk for pick up.

## 2022-10-01 ENCOUNTER — Ambulatory Visit: Payer: Managed Care, Other (non HMO)

## 2022-10-04 ENCOUNTER — Encounter (HOSPITAL_BASED_OUTPATIENT_CLINIC_OR_DEPARTMENT_OTHER): Payer: Self-pay | Admitting: Emergency Medicine

## 2022-10-04 ENCOUNTER — Emergency Department (HOSPITAL_BASED_OUTPATIENT_CLINIC_OR_DEPARTMENT_OTHER): Payer: Managed Care, Other (non HMO)

## 2022-10-04 ENCOUNTER — Emergency Department (HOSPITAL_BASED_OUTPATIENT_CLINIC_OR_DEPARTMENT_OTHER)
Admission: EM | Admit: 2022-10-04 | Discharge: 2022-10-04 | Disposition: A | Discharge status: Home / Self Care | Payer: Managed Care, Other (non HMO) | Attending: Emergency Medicine | Admitting: Emergency Medicine

## 2022-10-04 ENCOUNTER — Other Ambulatory Visit: Payer: Self-pay

## 2022-10-04 DIAGNOSIS — R6884 Jaw pain: Secondary | ICD-10-CM | POA: Diagnosis not present

## 2022-10-04 DIAGNOSIS — J45909 Unspecified asthma, uncomplicated: Secondary | ICD-10-CM | POA: Diagnosis not present

## 2022-10-04 DIAGNOSIS — B349 Viral infection, unspecified: Secondary | ICD-10-CM | POA: Diagnosis not present

## 2022-10-04 DIAGNOSIS — H9203 Otalgia, bilateral: Secondary | ICD-10-CM | POA: Insufficient documentation

## 2022-10-04 DIAGNOSIS — R519 Headache, unspecified: Secondary | ICD-10-CM | POA: Diagnosis not present

## 2022-10-04 DIAGNOSIS — E039 Hypothyroidism, unspecified: Secondary | ICD-10-CM | POA: Diagnosis not present

## 2022-10-04 DIAGNOSIS — K115 Sialolithiasis: Secondary | ICD-10-CM

## 2022-10-04 DIAGNOSIS — R509 Fever, unspecified: Secondary | ICD-10-CM

## 2022-10-04 DIAGNOSIS — Z79899 Other long term (current) drug therapy: Secondary | ICD-10-CM | POA: Insufficient documentation

## 2022-10-04 DIAGNOSIS — Z1152 Encounter for screening for COVID-19: Secondary | ICD-10-CM | POA: Diagnosis not present

## 2022-10-04 LAB — RESP PANEL BY RT-PCR (RSV, FLU A&B, COVID)  RVPGX2
Influenza A by PCR: NEGATIVE
Influenza B by PCR: NEGATIVE
Resp Syncytial Virus by PCR: NEGATIVE
SARS Coronavirus 2 by RT PCR: NEGATIVE

## 2022-10-04 LAB — COMPREHENSIVE METABOLIC PANEL
ALT: 12 U/L (ref 0–44)
AST: 24 U/L (ref 15–41)
Albumin: 4 g/dL (ref 3.5–5.0)
Alkaline Phosphatase: 54 U/L (ref 38–126)
Anion gap: 11 (ref 5–15)
BUN: 15 mg/dL (ref 6–20)
CO2: 23 mmol/L (ref 22–32)
Calcium: 9.4 mg/dL (ref 8.9–10.3)
Chloride: 99 mmol/L (ref 98–111)
Creatinine, Ser: 0.96 mg/dL (ref 0.44–1.00)
GFR, Estimated: >60 mL/min (ref >60)
Glucose, Bld: 91 mg/dL (ref 70–99)
Potassium: 3.8 mmol/L (ref 3.5–5.1)
Sodium: 133 mmol/L — ABNORMAL LOW (ref 135–145)
Total Bilirubin: 1.6 mg/dL — ABNORMAL HIGH (ref 0.3–1.2)
Total Protein: 8.2 g/dL — ABNORMAL HIGH (ref 6.5–8.1)

## 2022-10-04 LAB — CBC WITH DIFFERENTIAL/PLATELET
Abs Immature Granulocytes: 0.01 10*3/uL (ref 0.00–0.07)
Basophils Absolute: 0 10*3/uL (ref 0.0–0.1)
Basophils Relative: 0 %
Eosinophils Absolute: 0 10*3/uL (ref 0.0–0.5)
Eosinophils Relative: 0 %
HCT: 41.5 % (ref 36.0–46.0)
Hemoglobin: 13.2 g/dL (ref 12.0–15.0)
Immature Granulocytes: 0 %
Lymphocytes Relative: 12 %
Lymphs Abs: 1.2 10*3/uL (ref 0.7–4.0)
MCH: 28.4 pg (ref 26.0–34.0)
MCHC: 31.8 g/dL (ref 30.0–36.0)
MCV: 89.2 fL (ref 80.0–100.0)
Monocytes Absolute: 1.4 10*3/uL — ABNORMAL HIGH (ref 0.1–1.0)
Monocytes Relative: 14 %
Neutro Abs: 7.5 10*3/uL (ref 1.7–7.7)
Neutrophils Relative %: 74 %
Platelets: 150 10*3/uL (ref 150–400)
RBC: 4.65 MIL/uL (ref 3.87–5.11)
RDW: 12.7 % (ref 11.5–15.5)
WBC: 10.1 10*3/uL (ref 4.0–10.5)
nRBC: 0 % (ref 0.0–0.2)

## 2022-10-04 LAB — URINALYSIS, ROUTINE W REFLEX MICROSCOPIC
Bilirubin Urine: NEGATIVE
Glucose, UA: NEGATIVE mg/dL
Ketones, ur: 40 mg/dL — AB
Nitrite: NEGATIVE
Protein, ur: 100 mg/dL — AB
Specific Gravity, Urine: 1.02 (ref 1.005–1.030)
pH: 5.5 (ref 5.0–8.0)

## 2022-10-04 LAB — URINALYSIS, MICROSCOPIC (REFLEX): WBC, UA: >50 WBC/hpf (ref 0–5)

## 2022-10-04 LAB — LACTIC ACID, PLASMA: Lactic Acid, Venous: 1.2 mmol/L (ref 0.5–1.9)

## 2022-10-04 LAB — CBG MONITORING, ED: Glucose-Capillary: 99 mg/dL (ref 70–99)

## 2022-10-04 MED ORDER — ACETAMINOPHEN 500 MG PO TABS
1000.0000 mg | ORAL_TABLET | Freq: Once | ORAL | Status: AC
  Administered 2022-10-04: 1000 mg via ORAL
  Filled 2022-10-04: qty 2

## 2022-10-04 MED ORDER — SODIUM CHLORIDE 0.9 % IV BOLUS
1000.0000 mL | Freq: Once | INTRAVENOUS | Status: AC
  Administered 2022-10-04: 1000 mL via INTRAVENOUS

## 2022-10-04 MED ORDER — PROCHLORPERAZINE EDISYLATE 10 MG/2ML IJ SOLN
10.0000 mg | Freq: Once | INTRAMUSCULAR | Status: AC
  Administered 2022-10-04: 10 mg via INTRAVENOUS
  Filled 2022-10-04: qty 2

## 2022-10-04 MED ORDER — AMOXICILLIN-POT CLAVULANATE 875-125 MG PO TABS: 1.0000 | ORAL_TABLET | Freq: Two times a day (BID) | ORAL | 0 refills | Status: AC

## 2022-10-04 MED ORDER — DIPHENHYDRAMINE HCL 50 MG/ML IJ SOLN
50.0000 mg | Freq: Once | INTRAMUSCULAR | Status: AC
  Administered 2022-10-04: 50 mg via INTRAVENOUS
  Filled 2022-10-04: qty 1

## 2022-10-04 MED ORDER — ONDANSETRON 4 MG PO TBDP: 4.0000 mg | ORAL_TABLET | Freq: Three times a day (TID) | ORAL | 0 refills | Status: DC | PRN

## 2022-10-04 NOTE — Discharge Instructions (Signed)
Your history, exam, evaluation today are consistent with a likely viral infection causing your fevers nausea and symptoms however given the tenderness on your right cheek and the stone seen on imaging, we will give you antibiotics to start taking if you develop worsened symptoms of parotitis gland infection.  We feel you are safe for discharge home however given improvement in your symptoms and reassuring exam.  Please take medications to treat fever with Motrin and Tylenol and rest and stay hydrated.  Please follow-up with your primary doctor.  If any symptoms change or worsen acutely, please return to the nearest emergency department.

## 2022-10-04 NOTE — ED Notes (Signed)
Pt transported to radiology.

## 2022-10-04 NOTE — ED Notes (Signed)
1 set blood cultures collected from R wrist

## 2022-10-04 NOTE — ED Triage Notes (Signed)
C/o fever, R sided facial, ear and head pain (also R sided), lightheadedness, nausea, lower back pain (BIL) denies urinary sx, abd pain, vomiting, diarrhea or constipation. Also denies URI sx.

## 2022-10-04 NOTE — ED Notes (Signed)
ED Provider informed of VS. Pt denies dizziness upon standing, observed steady gait with ambulating to BR

## 2022-10-04 NOTE — ED Provider Notes (Signed)
Brittany Hodges   CSN: [id number] Arrival date & time: 10/04/22  0620     History  Chief Complaint  Patient presents with   Fever    Brittany Hodges is a 57 y.o. female.  The history is provided by the patient and medical records. No language interpreter was used.  Fever Temp source:  Subjective Severity:  Severe Onset quality:  Gradual Duration:  2 days Timing:  Intermittent Progression:  Waxing and waning Chronicity:  New Worsened by:  Nothing Ineffective treatments:  None tried Associated symptoms: chills, ear pain, headaches and nausea   Associated symptoms: no chest pain, no confusion, no congestion, no cough, no diarrhea, no dysuria, no rash and no sore throat        Home Medications Prior to Admission medications   Medication Sig Start Date End Date Taking? Authorizing Provider  amLODipine (NORVASC) 5 MG tablet Take 1 tablet (5 mg total) by mouth daily. 08/04/22   Duke Salvia, MD  amoxicillin (AMOXIL) 500 MG capsule Take 2,000 capsules by mouth once. 03/02/22   [provider]  BLACK CURRANT SEED OIL PO Take by mouth daily.    [provider]  Carboxymethylcellulose Sodium (EYE DROPS OP) Apply to eye daily.    [provider]  certolizumab pegol (CIMZIA) 2 X 200 MG KIT Inject 400 mg into the skin every 28 (twenty-eight) days. In-office injections Patient not taking: Reported on 07/15/2022    [provider]  Cholecalciferol (VITAMIN D) 50 MCG (2000 UT) tablet Take 2,000 Units by mouth daily.    [provider]  meclizine (ANTIVERT) 12.5 MG tablet Take 1 tablet (12.5 mg total) by mouth 3 (three) times daily as needed for dizziness. 01/20/22   Loeffler, Finis Bud, PA-C  prednisoLONE acetate (PRED FORTE) 1 % ophthalmic suspension Place 1 drop into both eyes daily. 06/01/18   [provider]  predniSONE (DELTASONE) 5 MG tablet Take 4 tablets by mouth daily x4  days, 3 tablets daily x4 days, 2 tablets daily x4 days, 1 tablet daily x4 days. 07/15/22   Gearldine Bienenstock, PA-C      Allergies    Etanercept, Methotrexate derivatives, and Pregabalin    Review of Systems   Review of Systems  Constitutional:  Positive for chills, fatigue and fever. Negative for diaphoresis.  HENT:  Positive for ear pain, sinus pressure and sinus pain. Negative for congestion, ear discharge, sore throat, trouble swallowing and voice change.   Eyes:  Negative for visual disturbance.  Respiratory:  Negative for cough, chest tightness, shortness of breath and wheezing.   Cardiovascular:  Negative for chest pain.  Gastrointestinal:  Positive for nausea. Negative for constipation and diarrhea.  Genitourinary:  Negative for dysuria and flank pain.  Musculoskeletal:  Negative for back pain.  Skin:  Negative for rash.  Neurological:  Positive for headaches. Negative for weakness, light-headedness and numbness.  Psychiatric/Behavioral:  Negative for confusion.   All other systems reviewed and are negative.   Physical Exam Updated Vital Signs BP (!) 113/99 (BP Location: Right Arm)   Pulse (!) 125   Temp (!) 103.2 F (39.6 C) (Oral)   Resp 20   Ht 5\' 8"  (1.727 m)   Wt 60.3 kg   LMP 05/01/2016   SpO2 97%   BMI 20.22 kg/m  Physical Exam Vitals and nursing Hodges reviewed.  Constitutional:      General: She is not in acute distress.  Appearance: She is well-developed. She is not ill-appearing, toxic-appearing or diaphoretic.  HENT:     Head: Atraumatic.      Comments: Ears showed no evidence of otitis media or otitis externa.    Nose: No congestion or rhinorrhea.     Mouth/Throat:     Mouth: Mucous membranes are moist.     Pharynx: No oropharyngeal exudate or posterior oropharyngeal erythema.  Eyes:     Extraocular Movements: Extraocular movements intact.     Conjunctiva/sclera: Conjunctivae normal.     Pupils: Pupils are equal, round, and reactive to light.   Cardiovascular:     Rate and Rhythm: Normal rate and regular rhythm.     Heart sounds: No murmur heard. Pulmonary:     Effort: Pulmonary effort is normal. No respiratory distress.     Breath sounds: Normal breath sounds. No wheezing, rhonchi or rales.  Chest:     Chest wall: No tenderness.  Abdominal:     General: Abdomen is flat.     Palpations: Abdomen is soft.     Tenderness: There is no abdominal tenderness. There is no right CVA tenderness, left CVA tenderness, guarding or rebound.  Musculoskeletal:        General: Tenderness present. No swelling.     Cervical back: Neck supple.     Right lower leg: No edema.     Left lower leg: No edema.  Skin:    General: Skin is warm and dry.     Capillary Refill: Capillary refill takes less than 2 seconds.     Findings: No erythema or rash.  Neurological:     General: No focal deficit present.     Mental Status: She is alert.  Psychiatric:        Mood and Affect: Mood normal.     ED Results / Procedures / Treatments   Labs (all labs ordered are listed, but only abnormal results are displayed) Labs Reviewed  COMPREHENSIVE METABOLIC PANEL - Abnormal; Notable for the following components:      Result Value   Sodium 133 (*)    Total Protein 8.2 (*)    Total Bilirubin 1.6 (*)    All other components within normal limits  CBC WITH DIFFERENTIAL/PLATELET - Abnormal; Notable for the following components:   Monocytes Absolute 1.4 (*)    All other components within normal limits  URINALYSIS, ROUTINE W REFLEX MICROSCOPIC - Abnormal; Notable for the following components:   APPearance CLOUDY (*)    Hgb urine dipstick MODERATE (*)    Ketones, ur 40 (*)    Protein, ur 100 (*)    Leukocytes,Ua SMALL (*)    All other components within normal limits  URINALYSIS, MICROSCOPIC (REFLEX) - Abnormal; Notable for the following components:   Bacteria, UA MANY (*)    All other components within normal limits  RESP PANEL BY RT-PCR (RSV, FLU A&B,  COVID)  RVPGX2  CULTURE, BLOOD (ROUTINE X 2)  CULTURE, BLOOD (ROUTINE X 2)  URINE CULTURE  LACTIC ACID, PLASMA  CBG MONITORING, ED    EKG None  Radiology CT Maxillofacial Wo Contrast  Result Date: 10/04/2022 CLINICAL DATA:  57 year old female with headache and fever. Right side face, ear and head pain. Lightheaded. EXAM: CT MAXILLOFACIAL WITHOUT CONTRAST TECHNIQUE: Multidetector CT imaging of the maxillofacial structures was performed. Multiplanar CT image reconstructions were also generated. RADIATION DOSE REDUCTION: This exam was performed according to the departmental dose-optimization program which includes automated exposure control, adjustment of the mA and/or kV  according to patient size and/or use of iterative reconstruction technique. COMPARISON:  Head CT today.  Previous face CT 02/25/2022. FINDINGS: Osseous: Mandible intact and normally located. No acute dental finding. Bilateral maxilla, zygoma, pterygoid, and nasal bones intact. Central skull base intact. Chronic cervical spine disc and endplate degeneration, visible vertebrae appear intact and aligned. Orbits: No orbital wall fracture. Bilateral orbits soft tissues appears symmetric and normal. Sinuses: Paranasal sinuses and mastoids are clear. Tympanic cavities are clear. External auditory canals appear unremarkable. Soft tissues: Advanced chronic bilateral parotid gland sialolithiasis redemonstrated. No evidence of parotid inflammation or parotid duct enlargement. Noncontrast submandibular glands appear negative along with the visible larynx, pharynx, parapharyngeal spaces, retropharyngeal space, sublingual space, masticator spaces. No upper cervical lymphadenopathy. Limited intracranial: Stable to that reported separately. IMPRESSION: 1. No acute finding in the noncontrast Face. 2. Extensive chronic parotid sialolithiasis. 3. Chronic cervical spine degeneration. Electronically Signed   By: Odessa Fleming M.D.   On: 10/04/2022 08:05   CT  Head Wo Contrast  Result Date: 10/04/2022 CLINICAL DATA:  57 year old female with headache and fever. Right side face, ear and head pain. Lightheaded. EXAM: CT HEAD WITHOUT CONTRAST TECHNIQUE: Contiguous axial images were obtained from the base of the skull through the vertex without intravenous contrast. RADIATION DOSE REDUCTION: This exam was performed according to the departmental dose-optimization program which includes automated exposure control, adjustment of the mA and/or kV according to patient size and/or use of iterative reconstruction technique. COMPARISON:  Face CT today reported separately.  Head CT 02/25/2022. FINDINGS: Brain: Cerebral volume remains normal. Stable mild basal ganglia vascular calcifications. No midline shift, ventriculomegaly, mass effect, evidence of mass lesion, intracranial hemorrhage or evidence of cortically based acute infarction. Gray-white matter differentiation is within normal limits throughout the brain. Vascular: No suspicious intracranial vascular hyperdensity. Skull: Stable, negative. Sinuses/Orbits: Visualized paranasal sinuses, tympanic cavities, and mastoids are clear. Other: No acute orbit or scalp soft tissue finding. There is extensive chronic parotid sialolithiasis. IMPRESSION: 1. Stable and normal for age noncontrast CT appearance of the brain. 2. Extensive chronic parotid sialolithiasis. Electronically Signed   By: Odessa Fleming M.D.   On: 10/04/2022 07:56   DG Chest 2 View  Result Date: 10/04/2022 CLINICAL DATA:  57 year old female with history of rheumatoid arthritis. Fever. EXAM: CHEST - 2 VIEW COMPARISON:  Chest x-ray 09/16/2020. FINDINGS: Lung volumes are normal. No consolidative airspace disease. No pleural effusions. No pneumothorax. No pulmonary nodule or mass noted. Pulmonary vasculature and the cardiomediastinal silhouette are within normal limits. Status post median sternotomy. IMPRESSION: 1.  No radiographic evidence of acute cardiopulmonary disease.  Electronically Signed   By: Trudie Reed M.D.   On: 10/04/2022 07:44    Procedures Procedures    Medications Ordered in ED Medications  acetaminophen (TYLENOL) tablet 1,000 mg (1,000 mg Oral Given 10/04/22 0711)  sodium chloride 0.9 % bolus 1,000 mL (0 mLs Intravenous Stopped 10/04/22 0800)  prochlorperazine (COMPAZINE) injection 10 mg (10 mg Intravenous Given 10/04/22 0747)  diphenhydrAMINE (BENADRYL) injection 50 mg (50 mg Intravenous Given 10/04/22 0747)    ED Course/ Medical Decision Making/ A&P                             Medical Decision Making Amount and/or Complexity of Data Reviewed Labs: ordered. Radiology: ordered.  Risk Prescription drug management.    Brittany Hodges is a 57 y.o. female with a past medical history significant for asthma, hypothyroidism, fibromyalgia, IBS, atrial  septal defect status post repair, rheumatoid arthritis on Cimzia medication, hypertension, and chronic low back pain who presents for 2 days of fevers, chills, right facial/sinus pain, right earache, nausea without vomiting, and fatigue.  According to patient, she was exposed to 2 different people with fevers over the last few days in the family.  Patient has had some general malaise and fatigue and developed soreness and pain to her right face and right ear/jaw.  She reports it feels similar when she has had sinus infection in the past.  She technically immunosuppressed and is being monitored for her low white blood cell count with her primary team.  She reports no constipation or diarrhea and denies any urinary symptoms.  She denies significant cough or congestion.  She reports some nausea but no vomiting.  She reports chronic low back pain that she was having before the illness over the last few days and does not think is related.  She denies any neck pain or neck stiffness.  She denies any vision changes or any focal neurologic deficits.  On exam, lungs were clear.  Chest nontender.  Abdomen  nontender.  Bowel sounds are appreciated.  Midline back and neck were nontender.  She did have some bilateral CVA tenderness which she reports is somewhat chronic.  She had some soreness to her right cheek and around her orbit and right sinuses.  Left side nontender.  No rash to suggest shingles.  No other neck tenderness.  Oropharyngeal exam showed no evidence of PTA or RPA.  Uvula midline.  Symmetric smile.  No focal neurologic deficits.  Pupils symmetric and reactive with normal extract movements.  Ears did not show otitis media or otitis externa.  Good pulses in extremities.  No nuchal rigidity and normal neck range of motion without significant pain.  Given her normal range of motion with her neck without any neck pain or stiffness have very low suspicion for meningitis and we agreed together with a shared decision-making conversation to hold on lumbar puncture.  Her vital signs tightly are concerning with fever, tachycardia and tachypnea on arrival.  We will get chest x-ray, urinalysis given the CVA pain, and will get screening labs.  We will get CT imaging of her head and face to rule out an abnormality such as sinusitis that may need antibiotic treatment given her immunosuppression and her history of sinusitis.  Will give her headache cocktail initially and reassess.  Anticipate reassessment after workup to determine edition however, given her recent fever exposure to feeling member suspect this is a viral infection overall.   Patient reports feeling much better after headache cocktail headache is gone.  She still some soreness in her right cheek/jaw area.  CT scan did not show evidence of acute parotitis but does show chronic sialolithiasis.  Unclear if she has some degree of early infection versus virus.  The rest of her workup was overall reassuring.  She did not have any urinary symptoms, doubt UTI but she does have some ketones likely indicating dehydration.  She was able to ambulate to the  bathroom back and is feeling better now.  We will give her prescription for nausea medicine and she will continue taking antipyretics at home.  Due to the tenderness and the stones, although the CT did not show convincing parotid duct dilation or parotitis, we will give her a prescription to fill in the next few days if she starts having worsened pain and continued fevers and symptoms to treat for suspected  early parotitis.  Patient agrees with this plan.  Patient be discharged for outpatient follow-up.  She had no otherwise or concerns and was discharged in good condition with improving symptoms.         Final Clinical Impression(s) / ED Diagnoses Final diagnoses:  Fever, unspecified fever cause  Viral illness  Sialolithiasis  Jaw pain    Rx / DC Orders ED Discharge Orders          Ordered    ondansetron (ZOFRAN-ODT) 4 MG disintegrating tablet  Every 8 hours PRN        10/04/22 1012    amoxicillin-clavulanate (AUGMENTIN) 875-125 MG tablet  Every 12 hours        10/04/22 1012           Clinical Impression: 1. Fever, unspecified fever cause   2. Viral illness   3. Sialolithiasis   4. Jaw pain     Disposition: Discharge  Condition: Good  I have discussed the results, Dx and Tx plan with the pt(& family if present). He/she/they expressed understanding and agree(s) with the plan. Discharge instructions discussed at great length. Strict return precautions discussed and pt &/or family have verbalized understanding of the instructions. No further questions at time of discharge.    New Prescriptions   AMOXICILLIN-CLAVULANATE (AUGMENTIN) 875-125 MG TABLET    Take 1 tablet by mouth every 12 (twelve) hours for 14 days.   ONDANSETRON (ZOFRAN-ODT) 4 MG DISINTEGRATING TABLET    Take 1 tablet (4 mg total) by mouth every 8 (eight) hours as needed for nausea or vomiting.    Follow Up: [name], MD [address] [place] [address] [phone]     [facility] Emergency Department at [facility] [address] [id number] YQ [facility] [place] [address] [phone]       Chiamaka Latka, Canary Brim, MD 10/04/22 [phone]

## 2022-10-04 NOTE — ED Notes (Signed)
Pt stated she was told not to take tylenol. When asked what reaction she stated she didn't know. Pt stated she would take the tylenol. Made pt aware would not be an issue to change order to ibuprofen, pt declined and requested the tylenol.

## 2022-10-04 NOTE — ED Notes (Signed)
1 set blood cultures collected from [facility] 

## 2022-10-05 ENCOUNTER — Telehealth: Payer: Self-pay | Admitting: *Deleted

## 2022-10-05 ENCOUNTER — Telehealth: Payer: Self-pay | Admitting: Internal Medicine

## 2022-10-05 LAB — CULTURE, BLOOD (ROUTINE X 2): Special Requests: ADEQUATE

## 2022-10-05 NOTE — Telephone Encounter (Signed)
Spoke with pt who reports current BP 119/68 with HR of 101.  Pt also complains of some lightheadedness and HA this morning.  Pt was seen in the ED and evaluated for fever, nausea and fatigue.  Pt with positive UA for bacteria.  Cx has been sent.  Na+ was also low.  Pt was given Augmentin and Zofran.  Pt denies fever this morning.  Pt encouraged to continue medications as prescribed.  May take Ibuprofen or Tylenol as she normally takes for HA.  Increase fluids for probable dehydration.  This should also help with elevated HR, headache and lightheadedness.  Pt advised she should follow up with her PCP.  Pt verbalizes understanding and agrees with current plan.

## 2022-10-05 NOTE — Telephone Encounter (Signed)
STAT if HR is under 50 or over 120 (normal HR is 60-100 beats per minute)  What is your heart rate? 101 - This morning  Do you have a log of your heart rate readings (document readings)? No  Do you have any other symptoms? Lightheaded, pt states that she also went to ED over the weekend

## 2022-10-05 NOTE — Telephone Encounter (Signed)
Spoke with patient to reschedule her Cimzia injection. Patient states she was seen in the ED and started on antibiotics. Patient will call once she has completed the antibiotics and is well.

## 2022-10-05 NOTE — Telephone Encounter (Signed)
Please arrange of ER follow-up for this week at patient's convenience.

## 2022-10-06 ENCOUNTER — Encounter: Payer: Self-pay | Admitting: Internal Medicine

## 2022-10-06 LAB — URINE CULTURE: Culture: >=100000 — AB

## 2022-10-06 LAB — CULTURE, BLOOD (ROUTINE X 2)

## 2022-10-06 NOTE — Telephone Encounter (Signed)
Went to the ER 2 days ago.  Had fever, chills, facial pain, earache, nausea, vomiting. Results reviewed. Urine culture show E. coli pansensitive. CT sinuses no acute.  CT head nonacute.  Chest x-ray no acute.

## 2022-10-06 NOTE — Telephone Encounter (Signed)
Appt scheduled for June 4th.

## 2022-10-07 ENCOUNTER — Telehealth (HOSPITAL_BASED_OUTPATIENT_CLINIC_OR_DEPARTMENT_OTHER): Payer: Self-pay | Admitting: *Deleted

## 2022-10-07 LAB — CULTURE, BLOOD (ROUTINE X 2): Culture: NO GROWTH

## 2022-10-07 NOTE — Telephone Encounter (Signed)
Post ED Visit - Positive Culture Follow-up  Culture report reviewed by antimicrobial stewardship pharmacist: [facility] [x]  [facility] Pharm.D. []  Celedonio Miyamoto, Pharm.D., BCPS AQ-ID []  Garvin Fila, Pharm.D., BCPS []  Georgina Pillion, Pharm.D., BCPS []  [place], [place].D., BCPS, AAHIVP []  Estella Husk, Pharm.D., BCPS, AAHIVP []  Lysle Pearl, PharmD, BCPS []  Phillips Climes, PharmD, BCPS []  Agapito Games, PharmD, BCPS []  Verlan Friends, PharmD []  Mervyn Gay, PharmD, BCPS []  Vinnie Level, PharmD  [facility] []  Len Childs, PharmD []  Greer Pickerel, PharmD []  Adalberto Cole, PharmD []  Perlie Gold, Rph []  Lonell Face) Jean Rosenthal, PharmD []  Earl Many, PharmD []  Junita Push, PharmD []  Dorna Leitz, PharmD []  Terrilee Files, PharmD []  Lynann Beaver, PharmD []  Keturah Barre, PharmD []  Loralee Pacas, PharmD []  Bernadene Person, PharmD   Positive urine culture Treated with Augmentin, organism sensitive to the same and no further patient follow-up is required at this time.  Nena Polio Garner Nash 10/07/2022, 1:44 PM

## 2022-10-08 IMAGING — CT CT ANGIO CHEST
2 of 8 series · 19 of 36 positions shown · IV contrast (Omnipaque)
Comparison: 09/16/2020, 03/12/2015

CLINICAL DATA: Sharp right-sided chest pain with inhalation,
arrhythmia

EXAM:
CT ANGIOGRAPHY CHEST WITH CONTRAST
TECHNIQUE: Multidetector CT imaging of the chest was performed using the
standard protocol during bolus administration of intravenous
contrast. Multiplanar CT image reconstructions and MIPs were
obtained to evaluate the vascular anatomy.
CONTRAST:  100mL OMNIPAQUE IOHEXOL 350 MG/ML SOLN

[Series 6: pe thins · axial · 0.68mm/px · z∈[-270,+2]mm · 18 of 306 slices shown]
[im 17/306  lung]
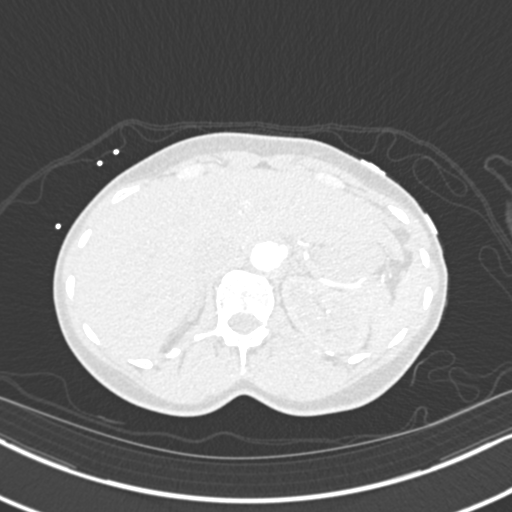
[im 33/306  mediastinal]
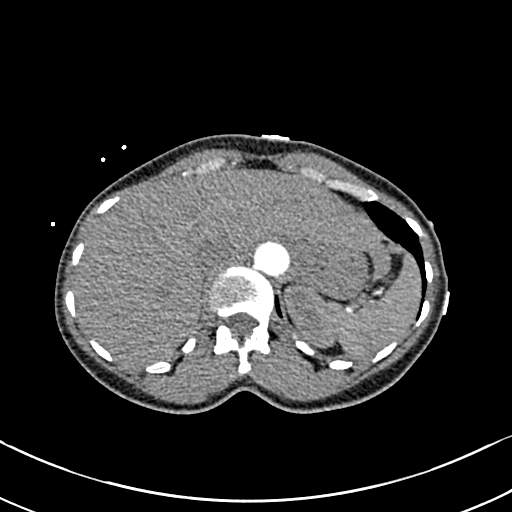
[im 49/306  lung]
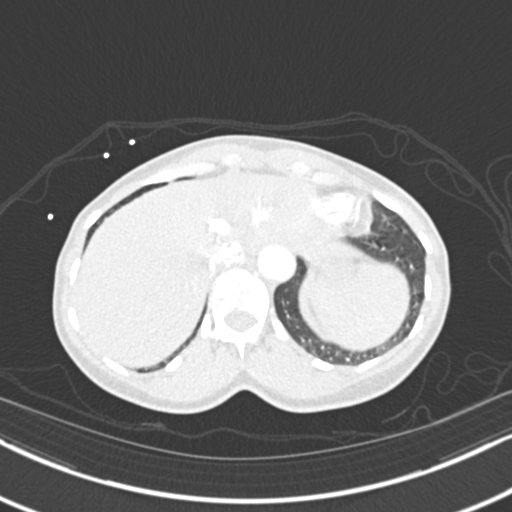
[im 65/306  mediastinal]
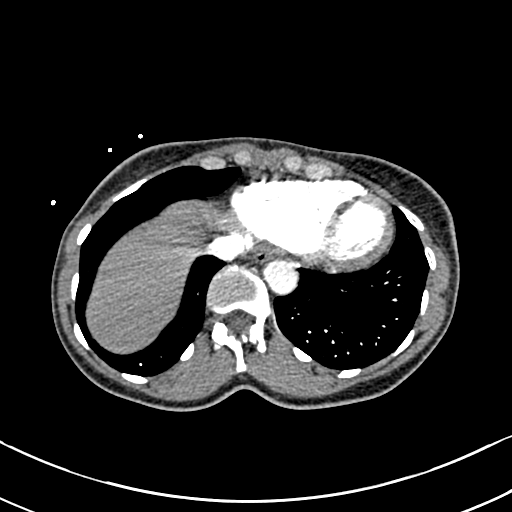
[im 81/306  lung]
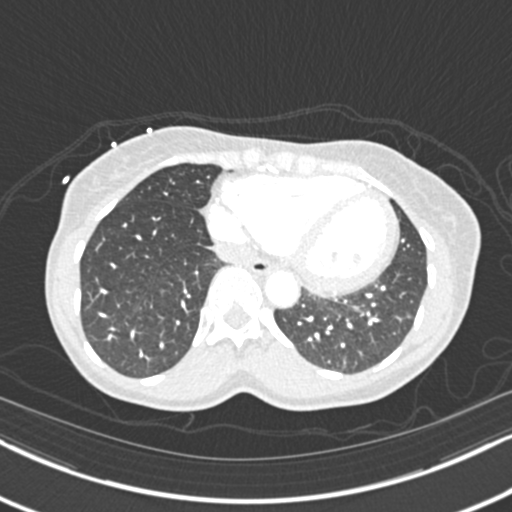
[im 97/306  mediastinal]
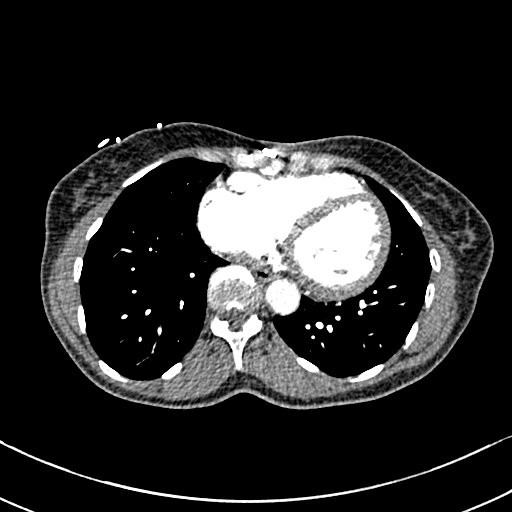
[im 113/306  lung]
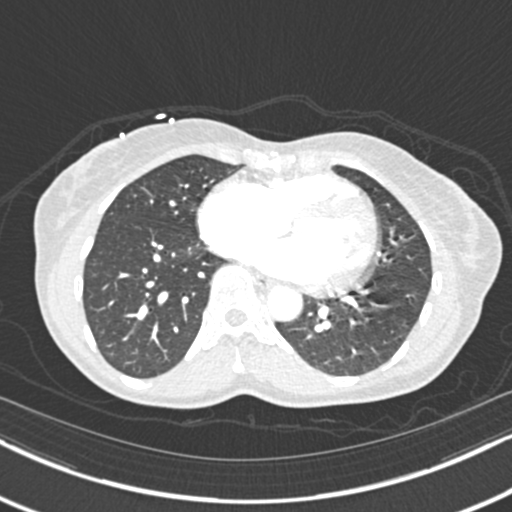
[im 129/306  mediastinal]
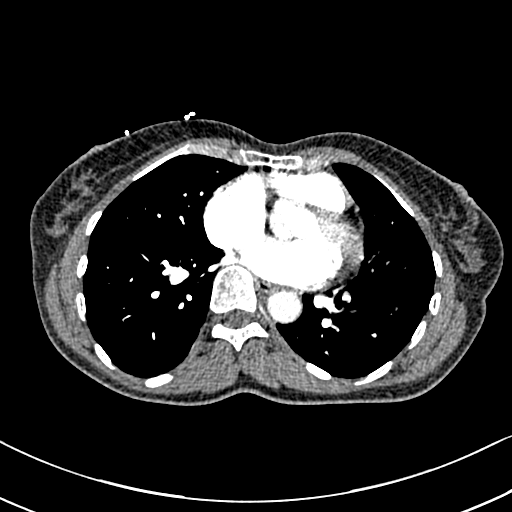
[im 145/306  lung]
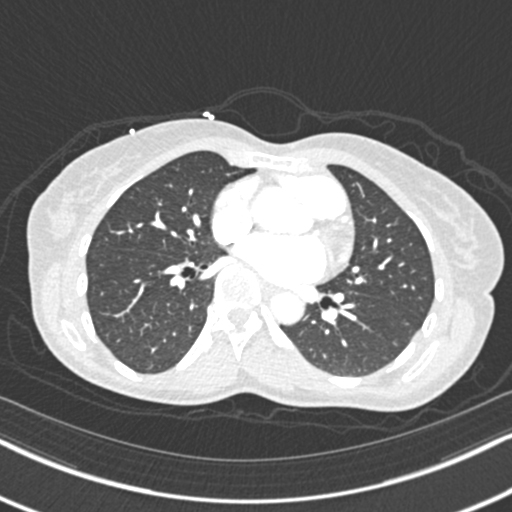
[im 161/306  mediastinal]
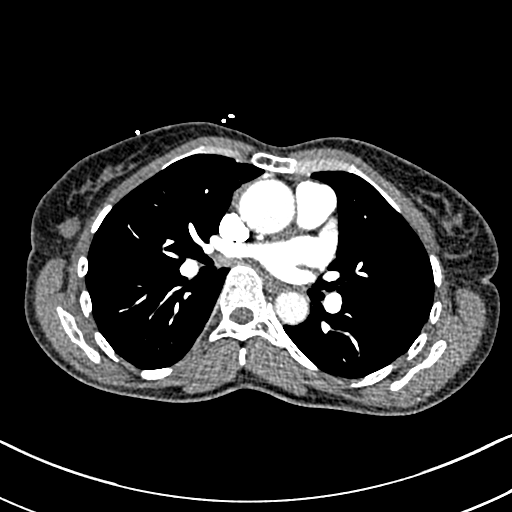
[im 177/306  lung]
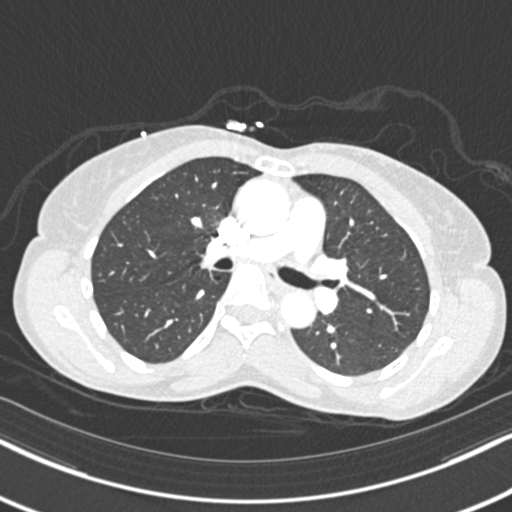
[im 193/306  mediastinal]
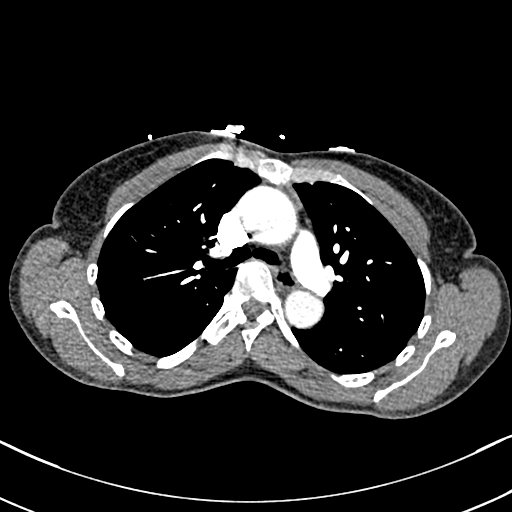
[im 209/306  lung]
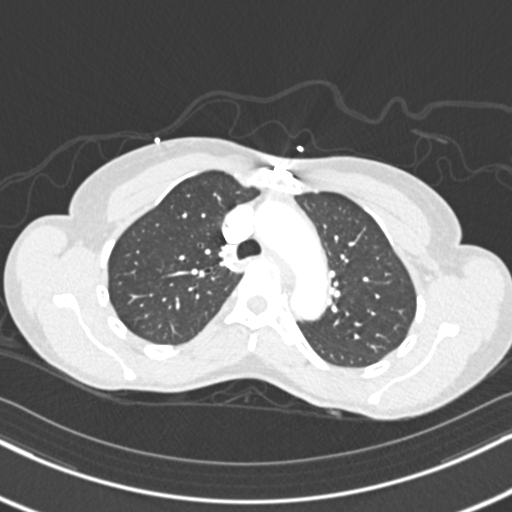
[im 225/306  mediastinal]
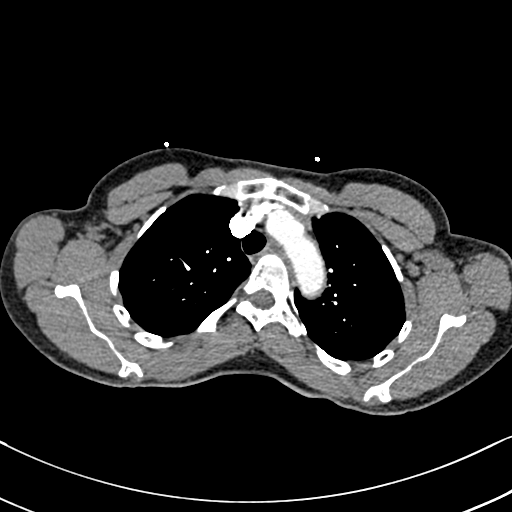
[im 241/306  lung]
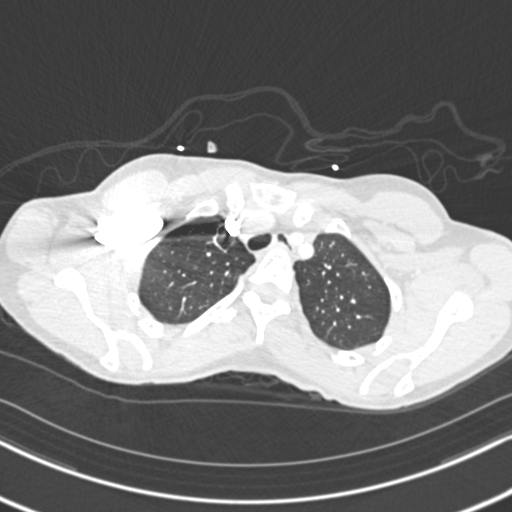
[im 257/306  mediastinal]
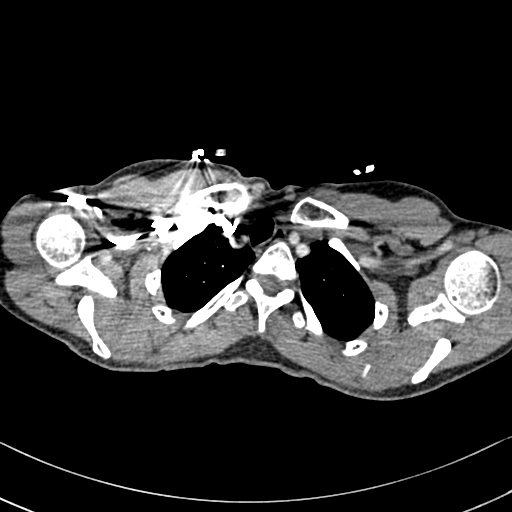
[im 273/306  lung]
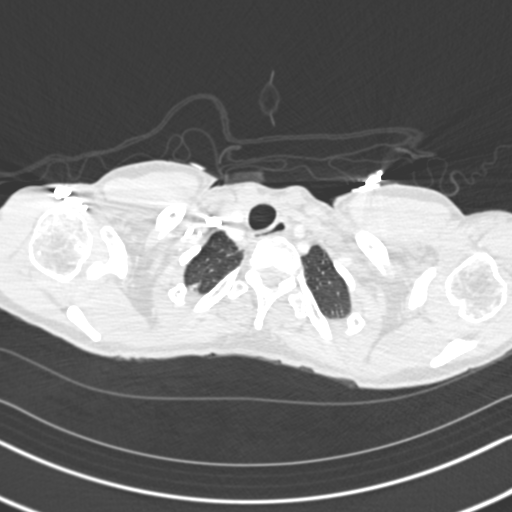
[im 289/306  mediastinal]
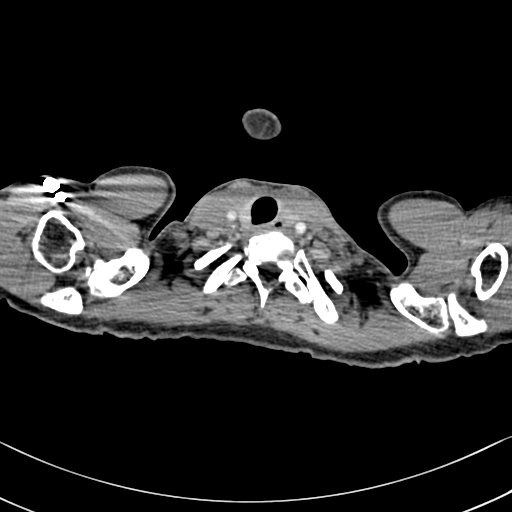

[Series 7: pe coronal mpr · coronal · 0.60mm/px · 1 of 98 slices shown]
[im 49/98  mediastinal]
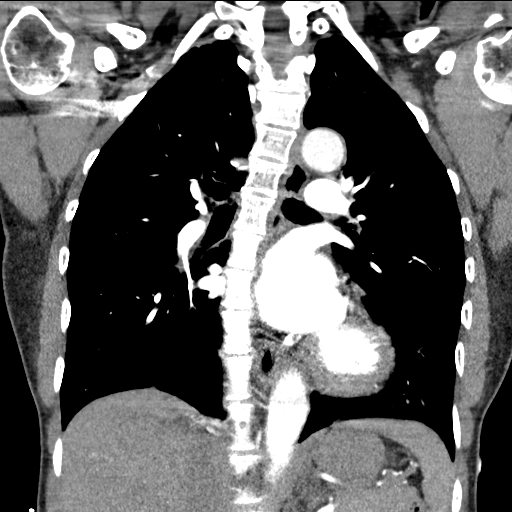

[19 of 36 positions shown; findings below may reference images not displayed]

FINDINGS: Cardiovascular: This is a technically adequate evaluation of the
pulmonary vasculature. No filling defects or pulmonary emboli.

Mild cardiomegaly without pericardial effusion. Stable dilation of
the ascending thoracic aorta measuring up to 3.8 cm. No frank
aneurysm or dissection.

Mediastinum/Nodes: No enlarged mediastinal, hilar, or axillary lymph
nodes. Thyroid gland, trachea, and esophagus demonstrate no
significant findings.

Lungs/Pleura: No acute airspace disease, effusion, or pneumothorax.
The central airways are patent.

Upper Abdomen: No acute abnormality.

Musculoskeletal: No acute or destructive bony lesions. Reconstructed
images demonstrate no additional findings.

Review of the MIP images confirms the above findings.
IMPRESSION: 1. No evidence of pulmonary embolus.
2. Stable dilation of the ascending thoracic aorta without frank
aneurysm or dissection.
3. Mild cardiomegaly.
4. No acute intrathoracic process.

## 2022-10-08 IMAGING — DX DG CHEST 1V PORT
1 series · 1 of 1 positions shown · non-contrast
Comparison: June 08, 2019

CLINICAL DATA: Right-sided chest pain.

EXAM:
PORTABLE CHEST 1 VIEW

[chest ap]
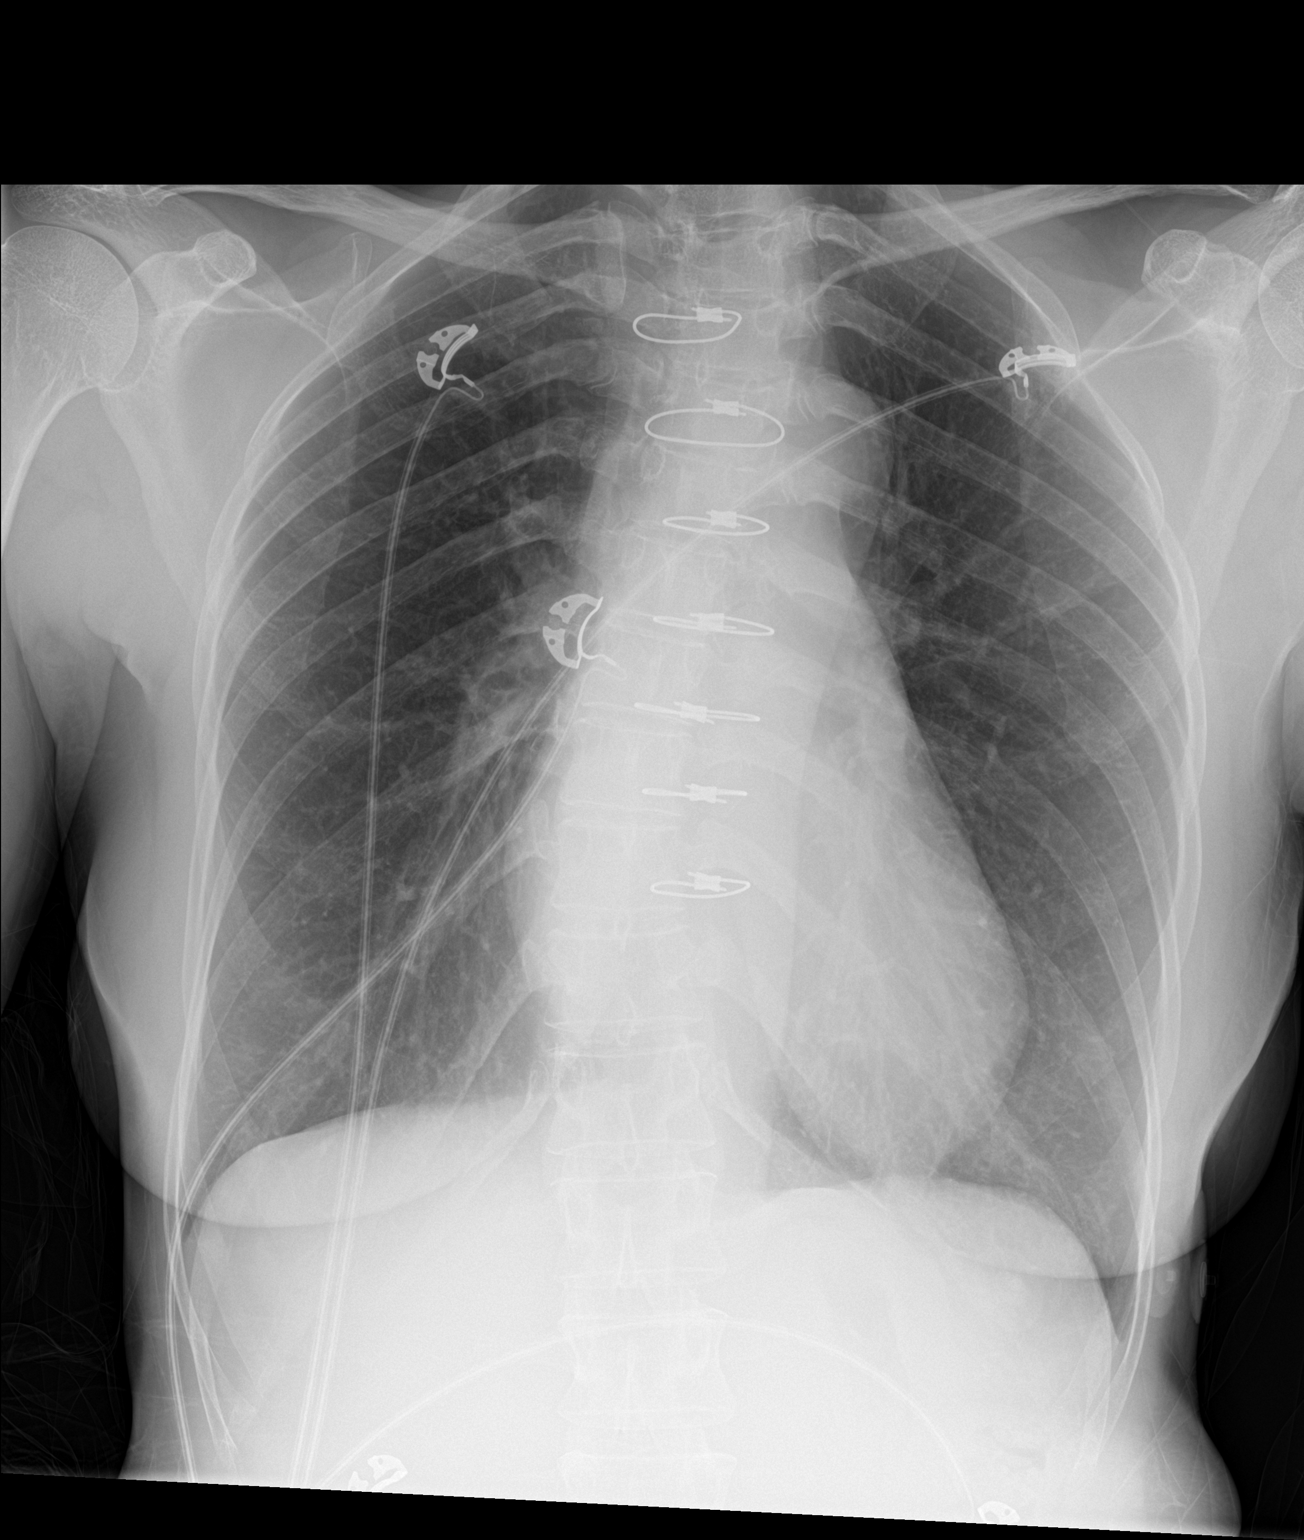

[1 of 1 positions shown; findings below may reference images not displayed]

FINDINGS: Multiple sternal wires are seen. The heart size and mediastinal
contours are within normal limits. Both lungs are clear. Mild to
moderate severity dextroscoliosis of the mid to lower thoracic spine
is seen.
IMPRESSION: Stable exam without acute or active cardiopulmonary disease.

## 2022-10-09 LAB — CULTURE, BLOOD (ROUTINE X 2)
Culture: NO GROWTH
Special Requests: ADEQUATE

## 2022-10-20 ENCOUNTER — Ambulatory Visit: Payer: Managed Care, Other (non HMO) | Admitting: Internal Medicine

## 2022-10-20 VITALS — BP 124/62 | HR 56 | Ht 68.0 in | Wt 129.0 lb

## 2022-10-20 DIAGNOSIS — N390 Urinary tract infection, site not specified: Secondary | ICD-10-CM

## 2022-10-20 DIAGNOSIS — E871 Hypo-osmolality and hyponatremia: Secondary | ICD-10-CM | POA: Diagnosis not present

## 2022-10-20 LAB — BASIC METABOLIC PANEL
BUN: 12 mg/dL (ref 6–23)
CO2: 30 mEq/L (ref 19–32)
Calcium: 9.9 mg/dL (ref 8.4–10.5)
Chloride: 102 mEq/L (ref 96–112)
Creatinine, Ser: 0.86 mg/dL (ref 0.40–1.20)
GFR: 74.22 mL/min (ref >60.00)
Glucose, Bld: 94 mg/dL (ref 70–99)
Potassium: 3.7 mEq/L (ref 3.5–5.1)
Sodium: 140 mEq/L (ref 135–145)

## 2022-10-20 NOTE — Patient Instructions (Signed)
Drink plenty of fluids  Continue checking your blood pressures  Call for questions or concerns.  GO TO THE LAB : Get the blood work, provide a urine sample

## 2022-10-20 NOTE — Progress Notes (Unsigned)
Subjective:    Patient ID: Brittany Hodges, female    DOB: Aug 19, 1965, 57 y.o.   MRN: [id number]  DOS:  10/20/2022 Type of visit - description: ER f/u Went to the ER 10/04/2022.  Her chief complaint was malaise, diaphoresis and fever. Did not have chest pain, cough, LUTS. ER MD noted some swelling of the right parotid gland, that has been a chronic issue and was not particularly worse that day. Workup at the ER: Sodium 133, protein 8.2, lactic acid negative.  Urine culture show E. coli.  CBC essentially normal. Viral panel negative.  Chest x-ray negative. CT head: Extensive chronic parotid sialolithiasis. CT maxillofacial with similar findings.  Blood Cortitrol starting to negative.  Saw ENT.  10/07/2022: They noted parotid stones having present since at least 2017, stone removal was an option as well as parotidectomy. Was referred to another ENT  Shortly after the ER visit BP was on the low side that is back to normal. She feels well and has no LUTS or any other symptoms.   Review of Systems See above   Past Medical History:  Diagnosis Date   Allergic rhinitis, cause unspecified    Anemia    Asthma    Atrial septal defect    s/p repair;   Echo 08/07/10: EF 55-60%; mild MR; atrial septal aneurysm; no residual ASD   Chest pain, unspecified    cardiac cath 07/31/10: normal coronaries; vigorous LVF   Esophageal reflux    Fibromyalgia    Headache(784.0)    Hematuria, unspecified    Hypertension    no meds    Hypothyroidism    Irritable bowel syndrome    Low back pain    Rheumatoid arthritis(714.0)    Dr Victory Dakin   Scoliosis    Sinoatrial node dysfunction (HCC)    eval. for chronotropic competence completed in past    Past Surgical History:  Procedure Laterality Date   ASD REPAIR  1985   TONSILLECTOMY AND ADENOIDECTOMY     TUBAL LIGATION      Current Outpatient Medications  Medication Instructions   amLODipine (NORVASC) 5 mg, Oral, Daily   amoxicillin (AMOXIL)  500 MG capsule 2,000 capsules,  Once   BLACK CURRANT SEED OIL PO Oral, Daily   Carboxymethylcellulose Sodium (EYE DROPS OP) Ophthalmic, Daily   Cimzia 400 mg, Subcutaneous, Every 28 days, In-office injections   meclizine (ANTIVERT) 12.5 mg, Oral, 3 times daily PRN   ondansetron (ZOFRAN-ODT) 4 mg, Oral, Every 8 hours PRN   prednisoLONE acetate (PRED FORTE) 1 % ophthalmic suspension 1 drop, Both Eyes, Daily   Vitamin D 2,000 Units, Oral, Daily       Objective:   Physical Exam Skin:        BP 124/62   Pulse (!) 56   Ht 5\' 8"  (1.727 m)   Wt 129 lb (58.5 kg)   LMP 05/01/2016   SpO2 96%   BMI 19.61 kg/m  General:   Well developed, NAD, BMI noted.  HEENT:  Normocephalic . Face symmetric, atraumatic Lungs:  CTA B Normal respiratory effort, no intercostal retractions, no accessory muscle use. Heart: RRR,  no murmur.  Abdomen:  Not distended, soft, non-tender. No rebound or rigidity.  No CVA tenderness Skin: Not pale. Not jaundice Lower extremities: no pretibial edema bilaterally  Neurologic:  alert & oriented X3.  Speech normal, gait appropriate for age and unassisted Psych--  Cognition and judgment appear intact.  Cooperative with normal attention span and concentration.  Behavior  appropriate. No anxious or depressed appearing.     Assessment     Assessment   Prediabetes: A1c 6.0 (01-2017) HTN H/o Hypothyroidism (took meds at some point) GERD- dysphagia -Dr. Baldwin [place] EGD from 04/02/2014 (Dr Boyd Kerbs): Narrowing of proximal esophagus, question of extrinsic extrinsic compression, EUS showing no intrinsic esophageal mass but the spine was seen in close proximity  to the esophagus probably resulting in extrinsic compression. The endoscopist recommend repeat CT neck and chest in 3 months.saw ENT 04-2015 for dysphagia,had a scope,  rx PPI Asthma, uses alb rarely  CV: --Atrial septal defect: Status post repair, no residual ASD: Needs ABX prophylaxis preprocedures --Sinoatrial   Dysfunction, sees Dr Graciela Husbands   --CP: cath 2012, normal coronaries MSK: --Rheumatoid arthritis,   Dr. Victory Dakin + rheumatoid factor +CCP,   h/o  low WBC with Methotrexate,   injection site reaction to Enbrel  , self d/c Humira ~ 12/2018 --Osteopenia: T score -1.7 (2020), -1.1 10/2020 IBS (Miralax prn constipation)   Admission: syncope 04-2016 (unlikely to be arrhythmia related per cards ) History of fibromyalgia   PLAN:  UTI: Went to the ER with fever and diaphoresis, workup show a UTI, was treated with antibiotics, currently asymptomatic.  Recheck UA urine culture. Also was found to have hyponatremia, rechecking today. R parotid lithiasis: Seems to be a chronic problem, saw ENT, the referred to another ENT for consideration of observation versus surgery or other procedures. Phlebitis: See physical exam, have a small nodule @ R arm on clinical grounds related to the vein system.  No recent injury that she can tell.  We agreed on observation, will call me if the area increases in size or get red or swollen.

## 2022-10-21 LAB — URINE CULTURE
MICRO NUMBER:: 15038886
Result:: NO GROWTH
SPECIMEN QUALITY:: ADEQUATE

## 2022-10-21 LAB — URINALYSIS, ROUTINE W REFLEX MICROSCOPIC
Bilirubin Urine: NEGATIVE
Hgb urine dipstick: NEGATIVE
Ketones, ur: NEGATIVE
Leukocytes,Ua: NEGATIVE
Nitrite: NEGATIVE
RBC / HPF: NONE SEEN (ref 0–?)
Specific Gravity, Urine: 1.02 (ref 1.000–1.030)
Total Protein, Urine: NEGATIVE
Urine Glucose: NEGATIVE
Urobilinogen, UA: 1 (ref 0.0–1.0)
pH: 6 (ref 5.0–8.0)

## 2022-10-22 NOTE — Assessment & Plan Note (Signed)
UTI: Went to the ER with fever and diaphoresis, workup show a UTI, was treated with antibiotics, currently asymptomatic.  Recheck UA urine culture. Also was found to have hyponatremia, rechecking today. R parotid lithiasis: Seems to be a chronic problem, saw ENT, the referred to another ENT for consideration of observation versus surgery or other procedures. Phlebitis: See physical exam, has  a small nodule at the  R arm;  on clinical grounds related to the vein system.  No recent injury that she can tell.  We agreed on observation, will call me if the area increases in size or get red or swollen.

## 2022-11-19 NOTE — Progress Notes (Deleted)
Office Visit Note  Patient: Brittany Hodges             Date of Birth: [birth year]           MRN: [id number]             PCP: [name], MD Referring: [name], MD Visit Date: 12/03/2022 Occupation: @GUAROCC @  Subjective:  No chief complaint on file.   History of Present Illness: [name] is a 57 y.o. female ***     Activities of Daily Living:  Patient reports morning stiffness for *** {minute/hour:19697}.   Patient {ACTIONS;DENIES/REPORTS:21021675::"Denies"} nocturnal pain.  Difficulty dressing/grooming: {ACTIONS;DENIES/REPORTS:21021675::"Denies"} Difficulty climbing stairs: {ACTIONS;DENIES/REPORTS:21021675::"Denies"} Difficulty getting out of chair: {ACTIONS;DENIES/REPORTS:21021675::"Denies"} Difficulty using hands for taps, buttons, cutlery, and/or writing: {ACTIONS;DENIES/REPORTS:21021675::"Denies"}  No Rheumatology ROS completed.   PMFS History:  Patient Active Problem List   Diagnosis Date Noted   Osteopenia 01/27/2022   Bradycardia 03/06/2021   Benign paroxysmal positional vertigo 03/06/2021   Mild intermittent asthma 12/22/2020   Chronic sinusitis 09/14/2019   Intolerance, food 09/14/2019   Asthmatic bronchitis 04/06/2016   Laryngopharyngeal reflux (LPR) 04/06/2016   PCP NOTES >>>>> 03/09/2015   Annual physical exam 12/21/2014   Cardiomegaly, by MRI 03/16/2014   Leukopenia 03/10/2014   Dizziness 02/10/2011   SINOATRIAL NODE DYSFUNCTION 08/06/2008   ATRIAL SEPTAL DEFECT 06/29/2007   Hypothyroidism 03/21/2007   Essential hypertension 03/21/2007   Perennial allergic rhinitis 03/21/2007   GERD-- dysphagia 03/21/2007   CONSTIPATION 03/21/2007   IRRITABLE BOWEL SYNDROME 03/21/2007   RA (rheumatoid arthritis) (HCC) 03/21/2007   LOW BACK PAIN 03/21/2007   FIBROMYALGIA 03/21/2007    Past Medical History:  Diagnosis Date   Allergic rhinitis, cause unspecified    Anemia    Asthma    Atrial septal defect    s/p repair;   Echo 08/07/10: EF  55-60%; mild MR; atrial septal aneurysm; no residual ASD   Chest pain, unspecified    cardiac cath 07/31/10: normal coronaries; vigorous LVF   Esophageal reflux    Fibromyalgia    Headache(784.0)    Hematuria, unspecified    Hypertension    no meds    Hypothyroidism    Irritable bowel syndrome    Low back pain    Rheumatoid arthritis(714.0)    Dr Victory Dakin   Scoliosis    Sinoatrial node dysfunction (HCC)    eval. for chronotropic competence completed in past    Family History  Problem Relation Age of Onset   Hypertension Mother    Diabetes Mother    Kidney failure Mother    Glaucoma Father    Hypertension Sister    Thyroid disease Sister    Hypertension Sister    Thyroid disease Sister    Heart disease Maternal Grandmother    Stomach cancer Maternal Grandmother 95   Rheum arthritis Maternal Grandmother    Sinusitis Daughter    Asthma Son    Eczema Son    Breast cancer Other        aunt   Diabetes Other        GM   Colon cancer Neg Hx    Allergic rhinitis Neg Hx    Angioedema Neg Hx    Immunodeficiency Neg Hx    Past Surgical History:  Procedure Laterality Date   ASD REPAIR  1985   TONSILLECTOMY AND ADENOIDECTOMY     TUBAL LIGATION     Social History   Social History Narrative   Household-- lives by herself  Immunization History  Administered Date(s) Administered   Influenza Split 03/17/2011, 03/17/2012   Influenza Whole 04/05/2009, 02/21/2010   Influenza, Seasonal, Injecte, Preservative Fre 03/11/2021   Influenza,inj,Quad PF,6+ Mos 03/21/2013, 03/09/2014, 05/24/2015, 01/27/2016   Influenza-Unspecified 07/30/2020, 02/17/2022   PFIZER(Purple Top)SARS-COV-2 Vaccination 12/16/2019, 01/16/2020   Pneumococcal Conjugate-13 01/27/2016   Pneumococcal Polysaccharide-23 03/08/2015   Td 07/23/2010   Tdap 05/28/2020, 02/25/2022     Objective: Vital Signs: LMP 05/01/2016    Physical Exam   Musculoskeletal Exam: ***  CDAI Exam: CDAI Score: -- Patient  Global: --; Provider Global: -- Swollen: --; Tender: -- Joint Exam 12/03/2022   No joint exam has been documented for this visit   There is currently no information documented on the homunculus. Go to the Rheumatology activity and complete the homunculus joint exam.  Investigation: No additional findings.  Imaging: No results found.  Recent Labs: Lab Results  Component Value Date   WBC 10.1 10/04/2022   HGB 13.2 10/04/2022   PLT 150 10/04/2022   NA 140 10/20/2022   K 3.7 10/20/2022   CL 102 10/20/2022   CO2 30 10/20/2022   GLUCOSE 94 10/20/2022   BUN 12 10/20/2022   CREATININE 0.86 10/20/2022   BILITOT 1.6 (H) 10/04/2022   ALKPHOS 54 10/04/2022   AST 24 10/04/2022   ALT 12 10/04/2022   PROT 8.2 (H) 10/04/2022   ALBUMIN 4.0 10/04/2022   CALCIUM 9.9 10/20/2022   GFRAA 89 02/15/2020   QFTBGOLDPLUS NEGATIVE 07/15/2022    Speciality Comments: Dexa- 05/27/18 Osteopenia T-Score -1.7 BMD 0.963  Prior therapy includes: Plaquenil (inadequate response) , Enbrel (injection site reaction) and methotrexate (neutropenia so on low dose).  Procedures:  No procedures performed Allergies: Etanercept, Methotrexate derivatives, and Pregabalin   Assessment / Plan:     Visit Diagnoses: Rheumatoid arthritis involving multiple sites with positive rheumatoid factor (HCC)  Iritis  High risk medication use  Other neutropenia (HCC)  Primary osteoarthritis of both hands  Primary osteoarthritis of both feet  DDD (degenerative disc disease), cervical  Arthropathy of lumbar facet joint  Trochanteric bursitis, right hip  Osteopenia of multiple sites  History of scoliosis  History of migraine  History of IBS  History of hypothyroidism  History of hypertension  History of gastroesophageal reflux (GERD)  History of bradycardia/ sinoatrial node dysfunction   Orders: No orders of the defined types were placed in this encounter.  No orders of the defined types were placed in  this encounter.   Face-to-face time spent with patient was *** minutes. Greater than 50% of time was spent in counseling and coordination of care.  Follow-Up Instructions: No follow-ups on file.   Gearldine Bienenstock, PA-C  Note - This record has been created using Dragon software.  Chart creation errors have been sought, but may not always  have been located. Such creation errors do not reflect on  the standard of medical care.

## 2022-12-01 ENCOUNTER — Telehealth: Payer: Self-pay | Admitting: *Deleted

## 2022-12-01 NOTE — Telephone Encounter (Signed)
Thank you for informing me.  We will further discuss the significance of a positive Ro antibody at her scheduled follow up visit

## 2022-12-01 NOTE — Telephone Encounter (Signed)
FYI - Patient called, Abnormal labs in Care Everywhere +SSA, patient r/s appt 12/17/2022

## 2022-12-03 ENCOUNTER — Ambulatory Visit: Payer: Managed Care, Other (non HMO) | Admitting: Physician Assistant

## 2022-12-03 DIAGNOSIS — M0579 Rheumatoid arthritis with rheumatoid factor of multiple sites without organ or systems involvement: Secondary | ICD-10-CM

## 2022-12-03 DIAGNOSIS — M19072 Primary osteoarthritis, left ankle and foot: Secondary | ICD-10-CM

## 2022-12-03 DIAGNOSIS — Z87898 Personal history of other specified conditions: Secondary | ICD-10-CM

## 2022-12-03 DIAGNOSIS — M19041 Primary osteoarthritis, right hand: Secondary | ICD-10-CM

## 2022-12-03 DIAGNOSIS — M47816 Spondylosis without myelopathy or radiculopathy, lumbar region: Secondary | ICD-10-CM

## 2022-12-03 DIAGNOSIS — M8589 Other specified disorders of bone density and structure, multiple sites: Secondary | ICD-10-CM

## 2022-12-03 DIAGNOSIS — Z8639 Personal history of other endocrine, nutritional and metabolic disease: Secondary | ICD-10-CM

## 2022-12-03 DIAGNOSIS — H209 Unspecified iridocyclitis: Secondary | ICD-10-CM

## 2022-12-03 DIAGNOSIS — Z79899 Other long term (current) drug therapy: Secondary | ICD-10-CM

## 2022-12-03 DIAGNOSIS — Z8669 Personal history of other diseases of the nervous system and sense organs: Secondary | ICD-10-CM

## 2022-12-03 DIAGNOSIS — G8929 Other chronic pain: Secondary | ICD-10-CM

## 2022-12-03 DIAGNOSIS — M5030 Other cervical disc degeneration, unspecified cervical region: Secondary | ICD-10-CM

## 2022-12-03 DIAGNOSIS — M7061 Trochanteric bursitis, right hip: Secondary | ICD-10-CM

## 2022-12-03 DIAGNOSIS — Z8739 Personal history of other diseases of the musculoskeletal system and connective tissue: Secondary | ICD-10-CM

## 2022-12-03 DIAGNOSIS — Z8719 Personal history of other diseases of the digestive system: Secondary | ICD-10-CM

## 2022-12-03 DIAGNOSIS — Z8679 Personal history of other diseases of the circulatory system: Secondary | ICD-10-CM

## 2022-12-03 DIAGNOSIS — D708 Other neutropenia: Secondary | ICD-10-CM

## 2022-12-03 NOTE — Progress Notes (Deleted)
Office Visit Note  Patient: Brittany Hodges             Date of Birth: 1965-08-14           MRN: [id number]             PCP: [name], MD Referring: [name], MD Visit Date: 12/17/2022 Occupation: @GUAROCC @  Subjective:    History of Present Illness: MOLLEIGH HUOT is a 57 y.o. female with history of seropositive rheumatoid arthritis, iritis, and osteoarthritis.  Patient remains on cimzia in-office administered injections once monthly.   TB gold negative on 07/15/22.  CBC and CMP updated on 10/04/22.   Activities of Daily Living:  Patient reports morning stiffness for *** {minute/hour:19697}.   Patient {ACTIONS;DENIES/REPORTS:21021675::"Denies"} nocturnal pain.  Difficulty dressing/grooming: {ACTIONS;DENIES/REPORTS:21021675::"Denies"} Difficulty climbing stairs: {ACTIONS;DENIES/REPORTS:21021675::"Denies"} Difficulty getting out of chair: {ACTIONS;DENIES/REPORTS:21021675::"Denies"} Difficulty using hands for taps, buttons, cutlery, and/or writing: {ACTIONS;DENIES/REPORTS:21021675::"Denies"}  No Rheumatology ROS completed.   PMFS History:  Patient Active Problem List   Diagnosis Date Noted   Osteopenia 01/27/2022   Bradycardia 03/06/2021   Benign paroxysmal positional vertigo 03/06/2021   Mild intermittent asthma 12/22/2020   Chronic sinusitis 09/14/2019   Intolerance, food 09/14/2019   Asthmatic bronchitis 04/06/2016   Laryngopharyngeal reflux (LPR) 04/06/2016   PCP NOTES >>>>> 03/09/2015   Annual physical exam 12/21/2014   Cardiomegaly, by MRI 03/16/2014   Leukopenia 03/10/2014   Dizziness 02/10/2011   SINOATRIAL NODE DYSFUNCTION 08/06/2008   ATRIAL SEPTAL DEFECT 06/29/2007   Hypothyroidism 03/21/2007   Essential hypertension 03/21/2007   Perennial allergic rhinitis 03/21/2007   GERD-- dysphagia 03/21/2007   CONSTIPATION 03/21/2007   IRRITABLE BOWEL SYNDROME 03/21/2007   RA (rheumatoid arthritis) (HCC) 03/21/2007   LOW BACK PAIN 03/21/2007    FIBROMYALGIA 03/21/2007    Past Medical History:  Diagnosis Date   Allergic rhinitis, cause unspecified    Anemia    Asthma    Atrial septal defect    s/p repair;   Echo 08/07/10: EF 55-60%; mild MR; atrial septal aneurysm; no residual ASD   Chest pain, unspecified    cardiac cath 07/31/10: normal coronaries; vigorous LVF   Esophageal reflux    Fibromyalgia    Headache(784.0)    Hematuria, unspecified    Hypertension    no meds    Hypothyroidism    Irritable bowel syndrome    Low back pain    Rheumatoid arthritis(714.0)    Dr Victory Dakin   Scoliosis    Sinoatrial node dysfunction (HCC)    eval. for chronotropic competence completed in past    Family History  Problem Relation Age of Onset   Hypertension Mother    Diabetes Mother    Kidney failure Mother    Glaucoma Father    Hypertension Sister    Thyroid disease Sister    Hypertension Sister    Thyroid disease Sister    Heart disease Maternal Grandmother    Stomach cancer Maternal Grandmother 95   Rheum arthritis Maternal Grandmother    Sinusitis Daughter    Asthma Son    Eczema Son    Breast cancer Other        aunt   Diabetes Other        GM   Colon cancer Neg Hx    Allergic rhinitis Neg Hx    Angioedema Neg Hx    Immunodeficiency Neg Hx    Past Surgical History:  Procedure Laterality Date   ASD REPAIR  1985   TONSILLECTOMY AND  ADENOIDECTOMY     TUBAL LIGATION     Social History   Social History Narrative   Household-- lives by herself   Immunization History  Administered Date(s) Administered   Influenza Split 03/17/2011, 03/17/2012   Influenza Whole 04/05/2009, 02/21/2010   Influenza, Seasonal, Injecte, Preservative Fre 03/11/2021   Influenza,inj,Quad PF,6+ Mos 03/21/2013, 03/09/2014, 05/24/2015, 01/27/2016   Influenza-Unspecified 07/30/2020, 02/17/2022   PFIZER(Purple Top)SARS-COV-2 Vaccination 12/16/2019, 01/16/2020   Pneumococcal Conjugate-13 01/27/2016   Pneumococcal Polysaccharide-23  03/08/2015   Td 07/23/2010   Tdap 05/28/2020, 02/25/2022     Objective: Vital Signs: LMP 05/01/2016    Physical Exam Vitals and nursing note reviewed.  Constitutional:      Appearance: She is well-developed.  HENT:     Head: Normocephalic and atraumatic.  Eyes:     Conjunctiva/sclera: Conjunctivae normal.  Cardiovascular:     Rate and Rhythm: Normal rate and regular rhythm.     Heart sounds: Normal heart sounds.  Pulmonary:     Effort: Pulmonary effort is normal.     Breath sounds: Normal breath sounds.  Abdominal:     General: Bowel sounds are normal.     Palpations: Abdomen is soft.  Musculoskeletal:     Cervical back: Normal range of motion.  Lymphadenopathy:     Cervical: No cervical adenopathy.  Skin:    General: Skin is warm and dry.     Capillary Refill: Capillary refill takes less than 2 seconds.  Neurological:     Mental Status: She is alert and oriented to person, place, and time.  Psychiatric:        Behavior: Behavior normal.      Musculoskeletal Exam: ***  CDAI Exam: CDAI Score: -- Patient Global: --; Provider Global: -- Swollen: --; Tender: -- Joint Exam 12/17/2022   No joint exam has been documented for this visit   There is currently no information documented on the homunculus. Go to the Rheumatology activity and complete the homunculus joint exam.  Investigation: No additional findings.  Imaging: No results found.  Recent Labs: Lab Results  Component Value Date   WBC 10.1 10/04/2022   HGB 13.2 10/04/2022   PLT 150 10/04/2022   NA 140 10/20/2022   K 3.7 10/20/2022   CL 102 10/20/2022   CO2 30 10/20/2022   GLUCOSE 94 10/20/2022   BUN 12 10/20/2022   CREATININE 0.86 10/20/2022   BILITOT 1.6 (H) 10/04/2022   ALKPHOS 54 10/04/2022   AST 24 10/04/2022   ALT 12 10/04/2022   PROT 8.2 (H) 10/04/2022   ALBUMIN 4.0 10/04/2022   CALCIUM 9.9 10/20/2022   GFRAA 89 02/15/2020   QFTBGOLDPLUS NEGATIVE 07/15/2022    Speciality Comments:  Dexa- 05/27/18 Osteopenia T-Score -1.7 BMD 0.963  Prior therapy includes: Plaquenil (inadequate response) , Enbrel (injection site reaction) and methotrexate (neutropenia so on low dose).  Procedures:  No procedures performed Allergies: Etanercept, Methotrexate derivatives, and Pregabalin   Assessment / Plan:     Visit Diagnoses: Rheumatoid arthritis involving multiple sites with positive rheumatoid factor (HCC)  Iritis  High risk medication use  Other neutropenia (HCC)  Primary osteoarthritis of both hands  Primary osteoarthritis of both feet  DDD (degenerative disc disease), cervical  Arthropathy of lumbar facet joint  Trochanteric bursitis, right hip  Osteopenia of multiple sites  History of scoliosis  History of migraine  History of IBS  History of hypothyroidism  History of hypertension  History of gastroesophageal reflux (GERD)  History of bradycardia/ sinoatrial node dysfunction  Orders: No orders of the defined types were placed in this encounter.  No orders of the defined types were placed in this encounter.   Face-to-face time spent with patient was *** minutes. Greater than 50% of time was spent in counseling and coordination of care.  Follow-Up Instructions: No follow-ups on file.   Gearldine Bienenstock, PA-C  Note - This record has been created using Dragon software.  Chart creation errors have been sought, but may not always  have been located. Such creation errors do not reflect on  the standard of medical care.

## 2022-12-10 ENCOUNTER — Ambulatory Visit (INDEPENDENT_AMBULATORY_CARE_PROVIDER_SITE_OTHER): Payer: Managed Care, Other (non HMO) | Admitting: Family Medicine

## 2022-12-10 ENCOUNTER — Encounter: Payer: Self-pay | Admitting: Family Medicine

## 2022-12-10 ENCOUNTER — Other Ambulatory Visit (HOSPITAL_COMMUNITY)
Admission: RE | Admit: 2022-12-10 | Discharge: 2022-12-10 | Disposition: A | Discharge status: Home / Self Care | Payer: Managed Care, Other (non HMO) | Source: Ambulatory Visit | Attending: Family Medicine | Admitting: Family Medicine

## 2022-12-10 VITALS — BP 127/70 | HR 49 | Ht 68.5 in | Wt 130.0 lb

## 2022-12-10 DIAGNOSIS — N898 Other specified noninflammatory disorders of vagina: Secondary | ICD-10-CM

## 2022-12-10 DIAGNOSIS — Z01419 Encounter for gynecological examination (general) (routine) without abnormal findings: Secondary | ICD-10-CM | POA: Diagnosis not present

## 2022-12-10 NOTE — Progress Notes (Signed)
Patient presents for Annual.  LMP: postmeopausal  Last pap: Date: 10/01/21 Contraception: Post-menopausal Mammogram:  03/04/2022 Colonoscopy: 07/11/2019  STD Screening: not indicated Flu Vaccine : N/A  CC:  Annual/ Maybe has bacterial infection  Vaginal irritation

## 2022-12-10 NOTE — Progress Notes (Signed)
ANNUAL EXAM Patient name: Brittany Hodges MRN [id number]  Date of birth: 04-May-1966 Chief Complaint:   Annual Exam  History of Present Illness:   Brittany Hodges is a 57 y.o.  [phone]  female  being seen today for a routine annual exam.  Current complaints: vaginal irritation  Patient's last menstrual period was 05/01/2016.    Last pap 11/2021. Results were: NILM w/ HRHPV negative. H/O abnormal pap: no Last mammogram: 02/2022. Results were: normal.  Last colonoscopy: 06/2019. Results were: normal. Family h/o colorectal cancer: no     12/10/2022    4:06 PM 07/03/2022    1:39 PM 02/27/2022    8:59 AM 01/27/2022   10:08 AM 02/25/2021    1:29 PM  Depression screen PHQ 2/9  Decreased Interest 0 0 0 0 0  Down, Depressed, Hopeless 0 0 0 0 0  PHQ - 2 Score 0 0 0 0 0  Altered sleeping 0      Tired, decreased energy 0      Change in appetite 0      Feeling bad or failure about yourself  0      Trouble concentrating 0      Moving slowly or fidgety/restless 0      Suicidal thoughts 0      PHQ-9 Score 0      Difficult doing work/chores Not difficult at all            12/10/2022    4:06 PM  GAD 7 : Generalized Anxiety Score  Nervous, Anxious, on Edge 0  Control/stop worrying 0  Worry too much - different things 0  Trouble relaxing 0  Restless 0  Easily annoyed or irritable 0  Afraid - awful might happen 0  Total GAD 7 Score 0  Anxiety Difficulty Not difficult at all     Review of Systems:   Pertinent items are noted in HPI Denies any headaches, blurred vision, fatigue, shortness of breath, chest pain, abdominal pain, abnormal vaginal discharge/itching/odor/irritation, problems with periods, bowel movements, urination, or intercourse unless otherwise stated above. Pertinent History Reviewed:  Reviewed past medical,surgical, social and family history.  Reviewed problem list, medications and allergies. Physical Assessment:   Vitals:   12/10/22 1557  BP: 127/70   Pulse: (!) 49  Weight: 130 lb (59 kg)  Height: 5' 8.5" (1.74 m)  Body mass index is 19.48 kg/m.        Physical Examination:   General appearance - well appearing, and in no distress  Mental status - alert, oriented to person, place, and time  Psych:  She has a normal mood and affect  Skin - warm and dry, normal color, no suspicious lesions noted  Chest - effort normal, all lung fields clear to auscultation bilaterally  Heart - normal rate and regular rhythm  Neck:  midline trachea, no thyromegaly or nodules  Breasts - breasts appear normal, no suspicious masses, no skin or nipple changes or axillary nodes  Abdomen - soft, nontender, nondistended, no masses or organomegaly  Pelvic - VULVA: normal appearing vulva with no masses, tenderness or lesions  VAGINA: normal appearing vagina with normal color and discharge, no lesions  CERVIX: normal appearing cervix without discharge or lesions, no CMT  Thin prep pap is not done   UTERUS: uterus is felt to be normal size, shape, consistency and nontender   ADNEXA: No adnexal masses or tenderness noted.  Extremities:  No swelling or varicosities noted  Chaperone present for exam  Assessment & Plan:  1. Well woman exam with routine gynecological exam  2. Vaginal irritation Coconut oil Culture done   No orders of the defined types were placed in this encounter.   Meds: No orders of the defined types were placed in this encounter.   Follow-up: No follow-ups on file.  Levie Heritage, DO 12/10/2022 4:24 PM

## 2022-12-15 ENCOUNTER — Other Ambulatory Visit: Payer: Self-pay | Admitting: Family Medicine

## 2022-12-15 MED ORDER — METRONIDAZOLE 500 MG PO TABS: 500.0000 mg | ORAL_TABLET | Freq: Two times a day (BID) | ORAL | 0 refills | Status: AC

## 2022-12-15 MED ORDER — FLUCONAZOLE 150 MG PO TABS
150.0000 mg | ORAL_TABLET | Freq: Every day | ORAL | 2 refills | Status: DC
Start: 2022-12-15 — End: 2023-04-26

## 2022-12-17 ENCOUNTER — Telehealth: Payer: Self-pay | Admitting: Rheumatology

## 2022-12-17 ENCOUNTER — Ambulatory Visit: Payer: Managed Care, Other (non HMO) | Admitting: Physician Assistant

## 2022-12-17 DIAGNOSIS — M19071 Primary osteoarthritis, right ankle and foot: Secondary | ICD-10-CM

## 2022-12-17 DIAGNOSIS — D708 Other neutropenia: Secondary | ICD-10-CM

## 2022-12-17 DIAGNOSIS — M8589 Other specified disorders of bone density and structure, multiple sites: Secondary | ICD-10-CM

## 2022-12-17 DIAGNOSIS — M47816 Spondylosis without myelopathy or radiculopathy, lumbar region: Secondary | ICD-10-CM

## 2022-12-17 DIAGNOSIS — Z8679 Personal history of other diseases of the circulatory system: Secondary | ICD-10-CM

## 2022-12-17 DIAGNOSIS — M19041 Primary osteoarthritis, right hand: Secondary | ICD-10-CM

## 2022-12-17 DIAGNOSIS — Z79899 Other long term (current) drug therapy: Secondary | ICD-10-CM

## 2022-12-17 DIAGNOSIS — Z8719 Personal history of other diseases of the digestive system: Secondary | ICD-10-CM

## 2022-12-17 DIAGNOSIS — Z87898 Personal history of other specified conditions: Secondary | ICD-10-CM

## 2022-12-17 DIAGNOSIS — Z8639 Personal history of other endocrine, nutritional and metabolic disease: Secondary | ICD-10-CM

## 2022-12-17 DIAGNOSIS — M5030 Other cervical disc degeneration, unspecified cervical region: Secondary | ICD-10-CM

## 2022-12-17 DIAGNOSIS — M7061 Trochanteric bursitis, right hip: Secondary | ICD-10-CM

## 2022-12-17 DIAGNOSIS — Z8739 Personal history of other diseases of the musculoskeletal system and connective tissue: Secondary | ICD-10-CM

## 2022-12-17 DIAGNOSIS — H209 Unspecified iridocyclitis: Secondary | ICD-10-CM

## 2022-12-17 DIAGNOSIS — Z8669 Personal history of other diseases of the nervous system and sense organs: Secondary | ICD-10-CM

## 2022-12-17 DIAGNOSIS — M0579 Rheumatoid arthritis with rheumatoid factor of multiple sites without organ or systems involvement: Secondary | ICD-10-CM

## 2022-12-17 NOTE — Telephone Encounter (Signed)
Returned call to patient and advised that we will work on loading debit card funds for [employer] assistance. Advised her not to pay any cost for med until debit card fund process is completed. She will plan to stop by clinic tomorrow to drop off copy of bill she received.  Chesley Mires, PharmD, MPH, BCPS, CPP Clinical Pharmacist (Rheumatology and Pulmonology)

## 2022-12-17 NOTE — Telephone Encounter (Signed)
Patient called requesting a return call regarding her $1,707.39 bill she received for her Cimzia injection on 09/03/22.

## 2022-12-18 LAB — HM MAMMOGRAPHY

## 2022-12-19 NOTE — Progress Notes (Deleted)
Office Visit Note  Patient: Brittany Hodges             Date of Birth: 1965/11/29           MRN: [id number]             PCP: [name], MD Referring: [name], MD Visit Date: 12/24/2022 Occupation: @GUAROCC @  Subjective:    History of Present Illness: Brittany Hodges is a 57 y.o. female with history of seropositive rheumatoid arthritis, iritis, and osteoarthritis.  Patient remains on cimzia in-office administered injections once monthly.   TB gold negative on 07/15/22.  CBC and CMP updated on 10/04/22.Orders for CBC and CMP released today.  Discussed the importance of holding cimzia if she develops signs or symptoms of an infection and to resume once the infection has completely cleared.     Activities of Daily Living:  Patient reports morning stiffness for *** {minute/hour:19697}.   Patient {ACTIONS;DENIES/REPORTS:21021675::"Denies"} nocturnal pain.  Difficulty dressing/grooming: {ACTIONS;DENIES/REPORTS:21021675::"Denies"} Difficulty climbing stairs: {ACTIONS;DENIES/REPORTS:21021675::"Denies"} Difficulty getting out of chair: {ACTIONS;DENIES/REPORTS:21021675::"Denies"} Difficulty using hands for taps, buttons, cutlery, and/or writing: {ACTIONS;DENIES/REPORTS:21021675::"Denies"}  No Rheumatology ROS completed.   PMFS History:  Patient Active Problem List   Diagnosis Date Noted   Osteopenia 01/27/2022   Bradycardia 03/06/2021   Benign paroxysmal positional vertigo 03/06/2021   Mild intermittent asthma 12/22/2020   Chronic sinusitis 09/14/2019   Intolerance, food 09/14/2019   Asthmatic bronchitis 04/06/2016   Laryngopharyngeal reflux (LPR) 04/06/2016   PCP NOTES >>>>> 03/09/2015   Annual physical exam 12/21/2014   Cardiomegaly, by MRI 03/16/2014   Leukopenia 03/10/2014   Dizziness 02/10/2011   SINOATRIAL NODE DYSFUNCTION 08/06/2008   ATRIAL SEPTAL DEFECT 06/29/2007   Hypothyroidism 03/21/2007   Essential hypertension 03/21/2007   Perennial allergic  rhinitis 03/21/2007   GERD-- dysphagia 03/21/2007   CONSTIPATION 03/21/2007   IRRITABLE BOWEL SYNDROME 03/21/2007   RA (rheumatoid arthritis) (HCC) 03/21/2007   LOW BACK PAIN 03/21/2007   FIBROMYALGIA 03/21/2007    Past Medical History:  Diagnosis Date   Allergic rhinitis, cause unspecified    Anemia    Asthma    Atrial septal defect    s/p repair;   Echo 08/07/10: EF 55-60%; mild MR; atrial septal aneurysm; no residual ASD   Chest pain, unspecified    cardiac cath 07/31/10: normal coronaries; vigorous LVF   Esophageal reflux    Fibromyalgia    Headache(784.0)    Hematuria, unspecified    Hypertension    no meds    Hypothyroidism    Irritable bowel syndrome    Low back pain    Rheumatoid arthritis(714.0)    Dr Victory Dakin   Scoliosis    Sinoatrial node dysfunction (HCC)    eval. for chronotropic competence completed in past    Family History  Problem Relation Age of Onset   Hypertension Mother    Diabetes Mother    Kidney failure Mother    Glaucoma Father    Hypertension Sister    Thyroid disease Sister    Hypertension Sister    Thyroid disease Sister    Heart disease Maternal Grandmother    Stomach cancer Maternal Grandmother 95   Rheum arthritis Maternal Grandmother    Sinusitis Daughter    Asthma Son    Eczema Son    Breast cancer Other        aunt   Diabetes Other        GM   Colon cancer Neg Hx    Allergic rhinitis Neg  Hx    Angioedema Neg Hx    Immunodeficiency Neg Hx    Past Surgical History:  Procedure Laterality Date   ASD REPAIR  1985   TONSILLECTOMY AND ADENOIDECTOMY     TUBAL LIGATION     Social History   Social History Narrative   Household-- lives by herself   Immunization History  Administered Date(s) Administered   Influenza Split 03/17/2011, 03/17/2012   Influenza Whole 04/05/2009, 02/21/2010   Influenza, Seasonal, Injecte, Preservative Fre 03/11/2021   Influenza,inj,Quad PF,6+ Mos 03/21/2013, 03/09/2014, 05/24/2015, 01/27/2016    Influenza-Unspecified 07/30/2020, 02/17/2022   PFIZER(Purple Top)SARS-COV-2 Vaccination 12/16/2019, 01/16/2020   Pneumococcal Conjugate-13 01/27/2016   Pneumococcal Polysaccharide-23 03/08/2015   Td 07/23/2010   Tdap 05/28/2020, 02/25/2022     Objective: Vital Signs: LMP 05/01/2016    Physical Exam Vitals and nursing note reviewed.  Constitutional:      Appearance: She is well-developed.  HENT:     Head: Normocephalic and atraumatic.  Eyes:     Conjunctiva/sclera: Conjunctivae normal.  Cardiovascular:     Rate and Rhythm: Normal rate and regular rhythm.     Heart sounds: Normal heart sounds.  Pulmonary:     Effort: Pulmonary effort is normal.     Breath sounds: Normal breath sounds.  Abdominal:     General: Bowel sounds are normal.     Palpations: Abdomen is soft.  Musculoskeletal:     Cervical back: Normal range of motion.  Lymphadenopathy:     Cervical: No cervical adenopathy.  Skin:    General: Skin is warm and dry.     Capillary Refill: Capillary refill takes less than 2 seconds.  Neurological:     Mental Status: She is alert and oriented to person, place, and time.  Psychiatric:        Behavior: Behavior normal.     Musculoskeletal Exam: ***  CDAI Exam: CDAI Score: -- Patient Global: --; Provider Global: -- Swollen: --; Tender: -- Joint Exam 12/24/2022   No joint exam has been documented for this visit   There is currently no information documented on the homunculus. Go to the Rheumatology activity and complete the homunculus joint exam.  Investigation: No additional findings.  Imaging: No results found.  Recent Labs: Lab Results  Component Value Date   WBC 10.1 10/04/2022   HGB 13.2 10/04/2022   PLT 150 10/04/2022   NA 140 10/20/2022   K 3.7 10/20/2022   CL 102 10/20/2022   CO2 30 10/20/2022   GLUCOSE 94 10/20/2022   BUN 12 10/20/2022   CREATININE 0.86 10/20/2022   BILITOT 1.6 (H) 10/04/2022   ALKPHOS 54 10/04/2022   AST 24 10/04/2022    ALT 12 10/04/2022   PROT 8.2 (H) 10/04/2022   ALBUMIN 4.0 10/04/2022   CALCIUM 9.9 10/20/2022   GFRAA 89 02/15/2020   QFTBGOLDPLUS NEGATIVE 07/15/2022    Speciality Comments: Dexa- 05/27/18 Osteopenia T-Score -1.7 BMD 0.963  Prior therapy includes: Plaquenil (inadequate response) , Enbrel (injection site reaction) and methotrexate (neutropenia so on low dose).  Procedures:  No procedures performed Allergies: Etanercept, Methotrexate derivatives, and Pregabalin   Assessment / Plan:     Visit Diagnoses: Rheumatoid arthritis involving multiple sites with positive rheumatoid factor (HCC)  Iritis  High risk medication use  Other neutropenia (HCC)  Primary osteoarthritis of both hands  Primary osteoarthritis of both feet  DDD (degenerative disc disease), cervical  Arthropathy of lumbar facet joint  Trochanteric bursitis, right hip  Osteopenia of multiple sites  History  of scoliosis  History of migraine  History of IBS  History of hypothyroidism  History of hypertension  History of gastroesophageal reflux (GERD)  History of bradycardia/ sinoatrial node dysfunction   Orders: No orders of the defined types were placed in this encounter.  No orders of the defined types were placed in this encounter.   Face-to-face time spent with patient was *** minutes. Greater than 50% of time was spent in counseling and coordination of care.  Follow-Up Instructions: No follow-ups on file.   Gearldine Bienenstock, PA-C  Note - This record has been created using Dragon software.  Chart creation errors have been sought, but may not always  have been located. Such creation errors do not reflect on  the standard of medical care.

## 2022-12-21 NOTE — Telephone Encounter (Signed)
EOB from 09/03/22 uploaded into Cimzia savings program portal  Chesley Mires, PharmD, MPH, BCPS, CPP Clinical Pharmacist (Rheumatology and Pulmonology)

## 2022-12-22 ENCOUNTER — Encounter: Payer: Self-pay | Admitting: Internal Medicine

## 2022-12-23 NOTE — Telephone Encounter (Signed)
Per review, EOB for in-office Cimzia injection still under review  Chesley Mires, PharmD, MPH, BCPS, CPP Clinical Pharmacist (Rheumatology and Pulmonology)

## 2022-12-24 ENCOUNTER — Ambulatory Visit: Payer: Managed Care, Other (non HMO) | Admitting: Physician Assistant

## 2022-12-24 DIAGNOSIS — Z8669 Personal history of other diseases of the nervous system and sense organs: Secondary | ICD-10-CM

## 2022-12-24 DIAGNOSIS — Z8739 Personal history of other diseases of the musculoskeletal system and connective tissue: Secondary | ICD-10-CM

## 2022-12-24 DIAGNOSIS — Z8679 Personal history of other diseases of the circulatory system: Secondary | ICD-10-CM

## 2022-12-24 DIAGNOSIS — Z8639 Personal history of other endocrine, nutritional and metabolic disease: Secondary | ICD-10-CM

## 2022-12-24 DIAGNOSIS — M19041 Primary osteoarthritis, right hand: Secondary | ICD-10-CM

## 2022-12-24 DIAGNOSIS — Z87898 Personal history of other specified conditions: Secondary | ICD-10-CM

## 2022-12-24 DIAGNOSIS — H209 Unspecified iridocyclitis: Secondary | ICD-10-CM

## 2022-12-24 DIAGNOSIS — D708 Other neutropenia: Secondary | ICD-10-CM

## 2022-12-24 DIAGNOSIS — M7061 Trochanteric bursitis, right hip: Secondary | ICD-10-CM

## 2022-12-24 DIAGNOSIS — M19071 Primary osteoarthritis, right ankle and foot: Secondary | ICD-10-CM

## 2022-12-24 DIAGNOSIS — Z79899 Other long term (current) drug therapy: Secondary | ICD-10-CM

## 2022-12-24 DIAGNOSIS — M0579 Rheumatoid arthritis with rheumatoid factor of multiple sites without organ or systems involvement: Secondary | ICD-10-CM

## 2022-12-24 DIAGNOSIS — M5030 Other cervical disc degeneration, unspecified cervical region: Secondary | ICD-10-CM

## 2022-12-24 DIAGNOSIS — Z8719 Personal history of other diseases of the digestive system: Secondary | ICD-10-CM

## 2022-12-24 DIAGNOSIS — M8589 Other specified disorders of bone density and structure, multiple sites: Secondary | ICD-10-CM

## 2022-12-24 DIAGNOSIS — M47816 Spondylosis without myelopathy or radiculopathy, lumbar region: Secondary | ICD-10-CM

## 2022-12-24 NOTE — Telephone Encounter (Signed)
EOB for in-office Cimzia injection still under review  Chesley Mires, PharmD, MPH, BCPS, CPP Clinical Pharmacist (Rheumatology and Pulmonology)

## 2022-12-28 NOTE — Progress Notes (Unsigned)
Office Visit Note  Patient: Brittany Hodges             Date of Birth: May 29, 1965           MRN: [id number]             PCP: [name], MD Referring: [name], MD Visit Date: 12/29/2022 Occupation: @GUAROCC @  Subjective:  Ro antibody positive   History of Present Illness: Brittany Hodges is a 57 y.o. female with history of seropositive rheumatoid arthritis, iritis, and osteoarthritis.  Patient was last seen in the office on 09/03/2022 at which time the plan was for her to reinitiate an office administered Cimzia on a monthly basis.  Her last dose of Cimzia was administered on 09/03/2022.  She has had a gap in therapy due to having UTI as well as being overdue for lab work. Patient states that she had noticed improvement in the frequency of uveitis flares after her last Cimzia dose but since then had a flare about 3 weeks ago involving both eyes.  Patient states about 1 month ago she also had a flare of rheumatoid arthritis involving both hands.  She did not take any over-the-counter products for pain relief and did not take prednisone at that time. Patient established care with ENT Dr. Hezzie Bump on 11/17/22 due to history of recurrent salivary stones.  Patient had a repeat CT on 10/04/2022.  She was also tested for Sjogren's and her Ro antibody came back positive. She is currently experiencing pain and swelling in the right parotid gland but does not have a scheduled follow-up visit with her ENT.  She has been using warm compresses, massage, and lozenges to try to pass the stone.  Patient has mild eye dryness and nose dryness but has not been experiencing any mouth dryness.   Activities of Daily Living:  Patient reports morning stiffness for 0  none .   Patient Denies nocturnal pain.  Difficulty dressing/grooming: Denies Difficulty climbing stairs: Denies Difficulty getting out of chair: Denies Difficulty using hands for taps, buttons, cutlery, and/or writing: Denies  Review of  Systems  Constitutional:  Negative for fatigue.  HENT:  Positive for nose dryness. Negative for mouth sores and mouth dryness.   Eyes:  Positive for dryness. Negative for pain.  Respiratory:  Negative for shortness of breath.   Cardiovascular:  Negative for chest pain and palpitations.  Gastrointestinal:  Negative for blood in stool, constipation and diarrhea.  Endocrine: Negative for increased urination.  Genitourinary:  Negative for involuntary urination.  Musculoskeletal:  Positive for joint swelling. Negative for joint pain, gait problem, joint pain, myalgias, muscle weakness, morning stiffness, muscle tenderness and myalgias.  Skin:  Negative for color change, rash, hair loss and sensitivity to sunlight.  Allergic/Immunologic: Negative for susceptible to infections.  Neurological:  Negative for dizziness and headaches.  Hematological:  Positive for swollen glands.  Psychiatric/Behavioral:  Negative for depressed mood and sleep disturbance. The patient is not nervous/anxious.     PMFS History:  Patient Active Problem List   Diagnosis Date Noted   Osteopenia 01/27/2022   Bradycardia 03/06/2021   Benign paroxysmal positional vertigo 03/06/2021   Mild intermittent asthma 12/22/2020   Chronic sinusitis 09/14/2019   Intolerance, food 09/14/2019   Asthmatic bronchitis 04/06/2016   Laryngopharyngeal reflux (LPR) 04/06/2016   PCP NOTES >>>>> 03/09/2015   Annual physical exam 12/21/2014   Cardiomegaly, by MRI 03/16/2014   Leukopenia 03/10/2014   Dizziness 02/10/2011   SINOATRIAL NODE DYSFUNCTION  08/06/2008   ATRIAL SEPTAL DEFECT 06/29/2007   Hypothyroidism 03/21/2007   Essential hypertension 03/21/2007   Perennial allergic rhinitis 03/21/2007   GERD-- dysphagia 03/21/2007   CONSTIPATION 03/21/2007   IRRITABLE BOWEL SYNDROME 03/21/2007   RA (rheumatoid arthritis) (HCC) 03/21/2007   LOW BACK PAIN 03/21/2007   FIBROMYALGIA 03/21/2007    Past Medical History:  Diagnosis Date    Allergic rhinitis, cause unspecified    Anemia    Asthma    Atrial septal defect    s/p repair;   Echo 08/07/10: EF 55-60%; mild MR; atrial septal aneurysm; no residual ASD   Chest pain, unspecified    cardiac cath 07/31/10: normal coronaries; vigorous LVF   Esophageal reflux    Fibromyalgia    Headache(784.0)    Hematuria, unspecified    Hypertension    no meds    Hypothyroidism    Irritable bowel syndrome    Low back pain    Rheumatoid arthritis(714.0)    Dr Victory Dakin   Scoliosis    Sinoatrial node dysfunction (HCC)    eval. for chronotropic competence completed in past   Sjogren's disease (HCC)     Family History  Problem Relation Age of Onset   Hypertension Mother    Diabetes Mother    Kidney failure Mother    Glaucoma Father    Hypertension Sister    Thyroid disease Sister    Hypertension Sister    Thyroid disease Sister    Heart disease Maternal Grandmother    Stomach cancer Maternal Grandmother 95   Rheum arthritis Maternal Grandmother    Sinusitis Daughter    Asthma Son    Eczema Son    Breast cancer Other        aunt   Diabetes Other        GM   Colon cancer Neg Hx    Allergic rhinitis Neg Hx    Angioedema Neg Hx    Immunodeficiency Neg Hx    Past Surgical History:  Procedure Laterality Date   ASD REPAIR  1985   TONSILLECTOMY AND ADENOIDECTOMY     TUBAL LIGATION     Social History   Social History Narrative   Household-- lives by herself   Immunization History  Administered Date(s) Administered   Influenza Split 03/17/2011, 03/17/2012   Influenza Whole 04/05/2009, 02/21/2010   Influenza, Seasonal, Injecte, Preservative Fre 03/11/2021   Influenza,inj,Quad PF,6+ Mos 03/21/2013, 03/09/2014, 05/24/2015, 01/27/2016   Influenza-Unspecified 07/30/2020, 02/17/2022   PFIZER(Purple Top)SARS-COV-2 Vaccination 12/16/2019, 01/16/2020   Pneumococcal Conjugate-13 01/27/2016   Pneumococcal Polysaccharide-23 03/08/2015   Td 07/23/2010   Tdap 05/28/2020,  02/25/2022     Objective: Vital Signs: BP 130/80 (BP Location: Left Arm, Patient Position: Sitting, Cuff Size: Normal)   Pulse (!) 45   Resp 15   Ht 5' 8.5" (1.74 m)   Wt 132 lb (59.9 kg)   LMP 05/01/2016   BMI 19.78 kg/m    Physical Exam Vitals and nursing note reviewed.  Constitutional:      Appearance: She is well-developed.  HENT:     Head: Normocephalic and atraumatic.     Mouth/Throat:     Comments: Right parotid swelling and tenderness upon palpation.  Eyes:     Conjunctiva/sclera: Conjunctivae normal.  Cardiovascular:     Rate and Rhythm: Normal rate and regular rhythm.     Heart sounds: Normal heart sounds.  Pulmonary:     Effort: Pulmonary effort is normal.     Breath sounds: Normal breath sounds.  Abdominal:     General: Bowel sounds are normal.     Palpations: Abdomen is soft.  Musculoskeletal:     Cervical back: Normal range of motion.  Lymphadenopathy:     Cervical: No cervical adenopathy.  Skin:    General: Skin is warm and dry.     Capillary Refill: Capillary refill takes less than 2 seconds.  Neurological:     Mental Status: She is alert and oriented to person, place, and time.  Psychiatric:        Behavior: Behavior normal.      Musculoskeletal Exam: C-spine, thoracic spine, and lumbar spine good ROM.  Shoulder joints, elbow joints, wrist joints, MCPs, PIPs, and DIPs good ROM with no synovitis.  Hip joints, knee joints, and ankle joints have good ROM with no discomfort.  No warmth or effusion of knee joints.  No tenderness or swelling of ankle joints.   CDAI Exam: CDAI Score: -- Patient Global: --; Provider Global: -- Swollen: --; Tender: -- Joint Exam 12/29/2022   No joint exam has been documented for this visit   There is currently no information documented on the homunculus. Go to the Rheumatology activity and complete the homunculus joint exam.  Investigation: No additional findings.  Imaging: No results found.  Recent Labs: Lab  Results  Component Value Date   WBC 10.1 10/04/2022   HGB 13.2 10/04/2022   PLT 150 10/04/2022   NA 140 10/20/2022   K 3.7 10/20/2022   CL 102 10/20/2022   CO2 30 10/20/2022   GLUCOSE 94 10/20/2022   BUN 12 10/20/2022   CREATININE 0.86 10/20/2022   BILITOT 1.6 (H) 10/04/2022   ALKPHOS 54 10/04/2022   AST 24 10/04/2022   ALT 12 10/04/2022   PROT 8.2 (H) 10/04/2022   ALBUMIN 4.0 10/04/2022   CALCIUM 9.9 10/20/2022   GFRAA 89 02/15/2020   QFTBGOLDPLUS NEGATIVE 07/15/2022    Speciality Comments: Dexa- 05/27/18 Osteopenia T-Score -1.7 BMD 0.963  Prior therapy includes: Plaquenil (inadequate response) , Enbrel (injection site reaction) and methotrexate (neutropenia so on low dose).  Procedures:  No procedures performed Allergies: Etanercept, Methotrexate derivatives, and Pregabalin     Assessment / Plan:     Visit Diagnoses: Rheumatoid arthritis involving multiple sites with positive rheumatoid factor (HCC) - +Anti-CCP: Patient was seen in the office on 09/03/2022 at which time she was having a flare involving her right knee.  She underwent a right knee joint cortisone injection as well as was reinitiated on an office administered Cimzia.  She has had a gap in therapy since then.  Due to update lab work today's orders for CBC and CMP were released.  She had a recent flare involving both hands. She continues to have recurrent flares of uveitis when she has gaps in therapy. Of note the patient has also been diagnosed with diffuse bilateral parotid sialolithiasis confirmed by CT maxillofacial on 10/04/2022--Under care of Dr. Hezzie Bump.  The risks of a parotidectomy were discussed at her last office visit.  Conservative treatment measures including hydration, stage, lotions, and warm compresses were discussed.  Sjogren's antibodies were also obtained on 11/17/22--Ro>8, La-.  Discussed diagnosis of sjogren's syndrome today in detail--including increased risk for lymphoma and ILD.   Patient is  currently having symptoms consistent with a salivary stone obstruction.  She has tenderness, swelling, and warmth in the right parotid gland on examination today.  I also had Dr. Corliss Skains examine her parotid gland today--Recommended a follow-up visit with her ENT to rule  out infection prior to reinitiate Cimzia.  She will continue to require close lab monitoring and close follow up.  - Plan: Rheumatoid factor, Cyclic citrul peptide antibody, IgG  Iritis: Recurrent flares.  Under the care of Dr. Hyacinth Meeker.  Most recent flare involving both eyes was 3 weeks ago requiring prednisone eyedrops.  Patient will require systemic treatment as previously discussed.  Plan to reinitiate Cimzia pending updated lab work and evaluation by ENT.  Sjogren's syndrome with other organ involvement (HCC) - Ro>8. La-: Evaluated by ENT--diagnosed with diffuse bilateral parotid sialolithiasis-CT 10/04/22-advanced chronic bilateral parotid gland sialolithiasis re-demonstrated.  No evidence of parotid inflammation or parotid duct enlargement.  Ro antibody >8 on 11/17/22.  La antibody negative.   Discussed the concern for Sjogren's syndrome.  The diagnosis of Sjogren's syndrome was discussed today in detail and all questions were addressed.  Patient has mild eye dryness and nose dryness but has not been experiencing any mouth dryness.  Discussed the importance of close monitoring by ophthalmology as well as having dental visits at least every 6 months.  Discussed that she will be at an increased risk for developing dental caries if she develops increased mouth dryness.  Discussed the importance of proper oral health/dental hygiene.  Discussed the increased risk for developing lymphoma (4-14x fold risk), neuropathy, and interstitial lung disease in patients with Sjogren's syndrome. Patient presents today with right-sided parotid swelling and tenderness.  Her symptoms are consistent with a possible salivary stone obstruction.  Patient was  advised to schedule a follow-up visit with ENT since she has tried conservative measures including massage because of the lotions, warm compresses, and hydration with no relief.  Discussed the importance of ruling out any infection prior to reinitiating Cimzia. ANA will be checked today along with the following lab work for further evaluation.  - Plan: Urinalysis, Routine w reflex microscopic, Sedimentation rate, ANA, Rheumatoid factor, C-reactive protein, Serum protein electrophoresis with reflex, C3 and C4  High risk medication use -Planning to reinitiate in-office Cimzia pending labs and workup by ENT-rule out infection of right parotid gland. TB gold negative on 07/15/22.  CBC and CMP updated on 10/04/22.Orders for CBC and CMP released today.  She will continue to require close lab monitoring. Discussed the importance of holding cimzia if she develops signs or symptoms of an infection and to resume once the infection has completely cleared.   Plan: COMPLETE METABOLIC PANEL WITH GFR, CBC with Differential/Platelet  Other neutropenia (HCC) -CBC with differential updated today.  She will require close lab monitoring while on Cimzia given history of neutropenia.  Plan: CBC with Differential/Platelet  Primary osteoarthritis of both hands: Patient has PIP and DIP thickening consistent with osteoarthritis of both hands.  No tenderness or inflammation noted on examination today.  Primary osteoarthritis of both feet: She is not experiencing discomfort in her feet at this time.  DDD (degenerative disc disease), cervical: CT maxillofacial 10/04/2022 revealed chronic cervical spine degeneration.  Arthropathy of lumbar facet joint: She is not experiencing any increased discomfort in her lower back at this time.  Trochanteric bursitis, right hip: Not currently symptomatic.  Osteopenia of multiple sites: DEXA updated on 10/28/2020 AP spine BMD 0.921 with T-score -1.1.  -4% change in BMD.  Patient due to update  DEXA.  Other medical conditions are listed as follows:  History of scoliosis  History of migraine  History of IBS  History of hypothyroidism  History of hypertension: Blood pressure was 130/80 today in the office.  History of gastroesophageal  reflux (GERD)  History of bradycardia/ sinoatrial node dysfunction   Orders: Orders Placed This Encounter  Procedures   COMPLETE METABOLIC PANEL WITH GFR   CBC with Differential/Platelet   Urinalysis, Routine w reflex microscopic   Sedimentation rate   ANA   Rheumatoid factor   C-reactive protein   Serum protein electrophoresis with reflex   Cyclic citrul peptide antibody, IgG   C3 and C4   No orders of the defined types were placed in this encounter.   Follow-Up Instructions: Return in about 2 months (around 02/28/2023) for Rheumatoid arthritis, Uveitis, Osteoarthritis.   Gearldine Bienenstock, PA-C  Note - This record has been created using Dragon software.  Chart creation errors have been sought, but may not always  have been located. Such creation errors do not reflect on  the standard of medical care.

## 2022-12-28 NOTE — Telephone Encounter (Signed)
EOB claim reimbursement for in-office Cimzia injection still under review per portal

## 2022-12-29 ENCOUNTER — Ambulatory Visit: Payer: Managed Care, Other (non HMO) | Attending: Physician Assistant | Admitting: Physician Assistant

## 2022-12-29 ENCOUNTER — Encounter: Payer: Self-pay | Admitting: Physician Assistant

## 2022-12-29 VITALS — BP 130/80 | HR 45 | Resp 15 | Ht 68.5 in | Wt 132.0 lb

## 2022-12-29 DIAGNOSIS — M7061 Trochanteric bursitis, right hip: Secondary | ICD-10-CM

## 2022-12-29 DIAGNOSIS — Z8639 Personal history of other endocrine, nutritional and metabolic disease: Secondary | ICD-10-CM

## 2022-12-29 DIAGNOSIS — M19042 Primary osteoarthritis, left hand: Secondary | ICD-10-CM

## 2022-12-29 DIAGNOSIS — D708 Other neutropenia: Secondary | ICD-10-CM

## 2022-12-29 DIAGNOSIS — M0579 Rheumatoid arthritis with rheumatoid factor of multiple sites without organ or systems involvement: Secondary | ICD-10-CM

## 2022-12-29 DIAGNOSIS — M8589 Other specified disorders of bone density and structure, multiple sites: Secondary | ICD-10-CM

## 2022-12-29 DIAGNOSIS — M3509 Sicca syndrome with other organ involvement: Secondary | ICD-10-CM

## 2022-12-29 DIAGNOSIS — Z8669 Personal history of other diseases of the nervous system and sense organs: Secondary | ICD-10-CM

## 2022-12-29 DIAGNOSIS — M47816 Spondylosis without myelopathy or radiculopathy, lumbar region: Secondary | ICD-10-CM

## 2022-12-29 DIAGNOSIS — Z8739 Personal history of other diseases of the musculoskeletal system and connective tissue: Secondary | ICD-10-CM

## 2022-12-29 DIAGNOSIS — Z8679 Personal history of other diseases of the circulatory system: Secondary | ICD-10-CM

## 2022-12-29 DIAGNOSIS — H209 Unspecified iridocyclitis: Secondary | ICD-10-CM | POA: Diagnosis not present

## 2022-12-29 DIAGNOSIS — Z87898 Personal history of other specified conditions: Secondary | ICD-10-CM

## 2022-12-29 DIAGNOSIS — M19041 Primary osteoarthritis, right hand: Secondary | ICD-10-CM

## 2022-12-29 DIAGNOSIS — M19071 Primary osteoarthritis, right ankle and foot: Secondary | ICD-10-CM

## 2022-12-29 DIAGNOSIS — Z8719 Personal history of other diseases of the digestive system: Secondary | ICD-10-CM

## 2022-12-29 DIAGNOSIS — M19072 Primary osteoarthritis, left ankle and foot: Secondary | ICD-10-CM

## 2022-12-29 DIAGNOSIS — Z79899 Other long term (current) drug therapy: Secondary | ICD-10-CM

## 2022-12-29 DIAGNOSIS — M5030 Other cervical disc degeneration, unspecified cervical region: Secondary | ICD-10-CM

## 2022-12-29 LAB — CBC WITH DIFFERENTIAL/PLATELET
Absolute Monocytes: 559 cells/uL (ref 200–950)
Basophils Absolute: 19 cells/uL (ref 0–200)
Basophils Relative: 0.5 %
Eosinophils Absolute: 19 cells/uL (ref 15–500)
Eosinophils Relative: 0.5 %
HCT: 39.8 % (ref 35.0–45.0)
Hemoglobin: 12.5 g/dL (ref 11.7–15.5)
Lymphs Abs: 1246 cells/uL (ref 850–3900)
MCH: 27.9 pg (ref 27.0–33.0)
MCHC: 31.4 g/dL — ABNORMAL LOW (ref 32.0–36.0)
MCV: 88.8 fL (ref 80.0–100.0)
MPV: 12.2 fL (ref 7.5–12.5)
Monocytes Relative: 14.7 %
Neutro Abs: 1957 cells/uL (ref 1500–7800)
Neutrophils Relative %: 51.5 %
Platelets: 149 10*3/uL (ref 140–400)
RBC: 4.48 10*6/uL (ref 3.80–5.10)
RDW: 11.8 % (ref 11.0–15.0)
Total Lymphocyte: 32.8 %
WBC: 3.8 10*3/uL (ref 3.8–10.8)

## 2022-12-29 LAB — SEDIMENTATION RATE: Sed Rate: 33 mm/h — ABNORMAL HIGH (ref 0–30)

## 2022-12-30 NOTE — Progress Notes (Signed)
CMP WNL.  CBC WNL ESR is elevated-33-consistent with inflammation.   UA normal.  Ok to schedule cimzia once infection has been ruled out by ENT

## 2022-12-30 NOTE — Telephone Encounter (Signed)
Claim is stll pending in [employer] savings card portal  Chesley Mires, PharmD, MPH, BCPS, CPP Clinical Pharmacist (Rheumatology and Pulmonology)

## 2023-01-01 NOTE — Telephone Encounter (Signed)
Claim is stll pending in [employer] savings card portal

## 2023-01-02 LAB — ANTI-NUCLEAR AB-TITER (ANA TITER)

## 2023-01-03 NOTE — Progress Notes (Signed)
SPEP-no abnormal protein bands.  ANA is negative.   RF and anti-CCP remain positive.   CRP WNL

## 2023-01-04 NOTE — Telephone Encounter (Signed)
Claim still pending in [employer] savings card portal.Per rep, funds have been approved but need to be swiped. Debit cards appear to have xpired in March 2024. Requested new cards from [facility] via email. Will await folow-up   Chesley Mires, PharmD, MPH, BCPS, CPP Clinical Pharmacist (Rheumatology and Pulmonology)

## 2023-01-12 NOTE — Telephone Encounter (Signed)
EOB from 09/03/22 uploaded under new ID # for Cimzia savings card in portal  Chesley Mires, PharmD, MPH, BCPS, CPP Clinical Pharmacist (Rheumatology and Pulmonology)

## 2023-01-20 NOTE — Telephone Encounter (Signed)
EOB charged. Receipt emailed to patient and mailed to her. Called patient to advise  Chesley Mires, PharmD, MPH, BCPS, CPP Clinical Pharmacist (Rheumatology and Pulmonology)

## 2023-01-27 ENCOUNTER — Other Ambulatory Visit: Payer: Self-pay | Admitting: Internal Medicine

## 2023-02-08 ENCOUNTER — Ambulatory Visit: Payer: Managed Care, Other (non HMO)

## 2023-02-08 NOTE — Progress Notes (Signed)
Office Visit Note  Patient: Brittany Hodges             Date of Birth: 25-Jan-1966           MRN: [id number]             PCP: [name], MD Referring: [name], MD Visit Date: 02/22/2023 Occupation: @GUAROCC @  Subjective:  Discuss resuming cimzia   History of Present Illness: Brittany Hodges is a 57 y.o. female with history of seropositive rheumatoid arthritis, sjogren's syndrome, iritis, and osteoarthritis.  Patient was last seen in the office on 12/29/2022 at which time the plan was for her to reinitiate an office administered Cimzia once infectious parotiditis was ruled out.  She was evaluated by ENT on 12/31/2022 at which time there was no signs of active infection.  She did not call back to set up resuming Cimzia monthly injections.  Patient continues to have chronic dry mouth and dry eyes.  She remains under the close follow up of ophthalmology.  Patient is currently having pain and inflammation involving the left index finger.  She denies any other joint pain or joint swelling at this time.  The right knee joint pain and swelling improved after having a cortisone injection on 09/03/22.   Activities of Daily Living:  Patient reports morning stiffness for 0  none .   Patient Denies nocturnal pain.  Difficulty dressing/grooming: Denies Difficulty climbing stairs: Denies Difficulty getting out of chair: Denies Difficulty using hands for taps, buttons, cutlery, and/or writing: Denies  Review of Systems  Constitutional:  Negative for fatigue.  HENT:  Negative for mouth sores and mouth dryness.   Eyes:  Negative for dryness.  Respiratory:  Negative for shortness of breath.   Cardiovascular:  Negative for chest pain and palpitations.  Gastrointestinal:  Negative for blood in stool, constipation and diarrhea.  Endocrine: Negative for increased urination.  Genitourinary:  Negative for involuntary urination.  Musculoskeletal:  Positive for joint swelling. Negative for joint  pain, gait problem, joint pain, myalgias, muscle weakness, morning stiffness, muscle tenderness and myalgias.  Skin:  Negative for color change, rash, hair loss and sensitivity to sunlight.  Allergic/Immunologic: Negative for susceptible to infections.  Neurological:  Negative for dizziness and headaches.  Hematological:  Positive for swollen glands.  Psychiatric/Behavioral:  Negative for depressed mood and sleep disturbance. The patient is not nervous/anxious.     PMFS History:  Patient Active Problem List   Diagnosis Date Noted   Osteopenia 01/27/2022   Bradycardia 03/06/2021   Benign paroxysmal positional vertigo 03/06/2021   Mild intermittent asthma 12/22/2020   Chronic sinusitis 09/14/2019   Intolerance, food 09/14/2019   Asthmatic bronchitis 04/06/2016   Laryngopharyngeal reflux (LPR) 04/06/2016   PCP NOTES >>>>> 03/09/2015   Annual physical exam 12/21/2014   Cardiomegaly, by MRI 03/16/2014   Leukopenia 03/10/2014   Dizziness 02/10/2011   SINOATRIAL NODE DYSFUNCTION 08/06/2008   ATRIAL SEPTAL DEFECT 06/29/2007   Hypothyroidism 03/21/2007   Essential hypertension 03/21/2007   Perennial allergic rhinitis 03/21/2007   GERD-- dysphagia 03/21/2007   CONSTIPATION 03/21/2007   IRRITABLE BOWEL SYNDROME 03/21/2007   RA (rheumatoid arthritis) (HCC) 03/21/2007   LOW BACK PAIN 03/21/2007   FIBROMYALGIA 03/21/2007    Past Medical History:  Diagnosis Date   Allergic rhinitis, cause unspecified    Anemia    Asthma    Atrial septal defect    s/p repair;   Echo 08/07/10: EF 55-60%; mild MR; atrial septal aneurysm; no residual  ASD   Chest pain, unspecified    cardiac cath 07/31/10: normal coronaries; vigorous LVF   Esophageal reflux    Fibromyalgia    Headache(784.0)    Hematuria, unspecified    Hypertension    no meds    Hypothyroidism    Irritable bowel syndrome    Low back pain    Rheumatoid arthritis(714.0)    Dr Victory Dakin   Scoliosis    Sinoatrial node dysfunction  (HCC)    eval. for chronotropic competence completed in past   Sjogren's disease (HCC)     Family History  Problem Relation Age of Onset   Hypertension Mother    Diabetes Mother    Kidney failure Mother    Glaucoma Father    Hypertension Sister    Thyroid disease Sister    Hypertension Sister    Thyroid disease Sister    Heart disease Maternal Grandmother    Stomach cancer Maternal Grandmother 95   Rheum arthritis Maternal Grandmother    Sinusitis Daughter    Asthma Son    Eczema Son    Breast cancer Other        aunt   Diabetes Other        GM   Colon cancer Neg Hx    Allergic rhinitis Neg Hx    Angioedema Neg Hx    Immunodeficiency Neg Hx    Past Surgical History:  Procedure Laterality Date   ASD REPAIR  1985   TONSILLECTOMY AND ADENOIDECTOMY     TUBAL LIGATION     Social History   Social History Narrative   Household-- lives by herself   Immunization History  Administered Date(s) Administered   Influenza Split 03/17/2011, 03/17/2012   Influenza Whole 04/05/2009, 02/21/2010   Influenza, Seasonal, Injecte, Preservative Fre 03/11/2021   Influenza,inj,Quad PF,6+ Mos 03/21/2013, 03/09/2014, 05/24/2015, 01/27/2016   Influenza-Unspecified 07/30/2020, 02/17/2022   PFIZER(Purple Top)SARS-COV-2 Vaccination 12/16/2019, 01/16/2020   Pneumococcal Conjugate-13 01/27/2016   Pneumococcal Polysaccharide-23 03/08/2015   Td 07/23/2010   Tdap 05/28/2020, 02/25/2022     Objective: Vital Signs: BP 116/69 (BP Location: Left Arm, Patient Position: Sitting, Cuff Size: Normal)   Pulse (!) 42   Resp 14   Ht 5' 8.5" (1.74 m)   Wt 132 lb (59.9 kg)   LMP 05/01/2016   BMI 19.78 kg/m    Physical Exam Vitals and nursing note reviewed.  Constitutional:      Appearance: She is well-developed.  HENT:     Head: Normocephalic and atraumatic.     Mouth/Throat:     Comments: No parotid swelling or tenderness noted today. Eyes:     Conjunctiva/sclera: Conjunctivae normal.   Cardiovascular:     Rate and Rhythm: Normal rate and regular rhythm.     Heart sounds: Normal heart sounds.  Pulmonary:     Effort: Pulmonary effort is normal.     Breath sounds: Normal breath sounds.  Abdominal:     General: Bowel sounds are normal.     Palpations: Abdomen is soft.  Musculoskeletal:     Cervical back: Normal range of motion.  Lymphadenopathy:     Cervical: No cervical adenopathy.  Skin:    General: Skin is warm and dry.     Capillary Refill: Capillary refill takes less than 2 seconds.  Neurological:     Mental Status: She is alert and oriented to person, place, and time.  Psychiatric:        Behavior: Behavior normal.      Musculoskeletal Exam:  C-spine, thoracic spine, lumbar spine have good range of motion.  Shoulder joints, elbow joints, and wrist joints have good range of motion.  Tenderness and synovitis of the left second PIP joint.  No tenderness or synovitis over MCP joints.  Hip joints have good range of motion with no groin pain.  Knee joints have good range of motion with no warmth or effusion.  Ankle joints have good range of motion with no tenderness or joint swelling.  CDAI Exam: CDAI Score: 6  Patient Global: 20 / 100; Provider Global: 20 / 100 Swollen: 1 ; Tender: 1  Joint Exam 02/22/2023      Right  Left  PIP 2 (finger)     Swollen Tender     Investigation: No additional findings.  Imaging: No results found.  Recent Labs: Lab Results  Component Value Date   WBC 3.8 12/29/2022   HGB 12.5 12/29/2022   PLT 149 12/29/2022   NA 141 12/29/2022   K 3.9 12/29/2022   CL 106 12/29/2022   CO2 28 12/29/2022   GLUCOSE 90 12/29/2022   BUN 16 12/29/2022   CREATININE 0.90 12/29/2022   BILITOT 0.3 12/29/2022   ALKPHOS 54 10/04/2022   AST 33 12/29/2022   ALT 28 12/29/2022   PROT 7.4 12/29/2022   PROT 7.6 12/29/2022   ALBUMIN 4.0 10/04/2022   CALCIUM 10.0 12/29/2022   GFRAA 89 02/15/2020   QFTBGOLDPLUS NEGATIVE 07/15/2022     Speciality Comments: Dexa- 05/27/18 Osteopenia T-Score -1.7 BMD 0.963  Prior therapy includes: Plaquenil (inadequate response) , Enbrel (injection site reaction) and methotrexate (neutropenia so on low dose).  Procedures:  No procedures performed Allergies: Etanercept, Methotrexate derivatives, and Pregabalin    Assessment / Plan:     Visit Diagnoses: Rheumatoid arthritis involving multiple sites with positive rheumatoid factor (HCC) - +Anti-CCP: Patient presents today with tenderness and synovitis involving the left second PIP joint.  She has been having a flare for the past 1 week with no identifiable trigger.  She is not experiencing any other joint pain or inflammation at this time.  Patient has not yet resumed Cimzia but is hoping to do so pending lab results today.  Her last dose of Cimzia was administered on 09/03/2022. She will benefit from reinitiating Cimzia on a monthly basis for better management of rheumatoid arthritis and iritis. She will be called to schedule an office administered Cimzia injection pending her lab results today.  She will continue to require close lab monitoring and routine follow-up at least every 3 months.  Iritis - Under the care of Dr. Hyacinth Meeker.  Recurrent flares.  Not currently taking any immunosuppressive agents.  Using prednisolone acetate eyedrops daily.   Patient plans on submitting FMLA paperwork. Plan to reinitiate Cimzia as discussed above.  Sjogren's syndrome with other organ involvement (HCC):Ro>8. La-: Evaluated by ENT--diagnosed with diffuse bilateral parotid sialolithiasis-CT 10/04/22-advanced chronic bilateral parotid gland sialolithiasis re-demonstrated.  No evidence of parotid inflammation or parotid duct enlargement.  Ro antibody >8 on 11/17/22.  La antibody negative.   Discussed the concern for Sjogren's syndrome at her last office visit.  Patient has mild eye dryness and nose dryness but has not been experiencing any mouth dryness.  Discussed  the importance of close monitoring by ophthalmology as well as having dental visits at least every 6 months.  She was last seen by ENT on 12/31/2022--no sign of active infection-no masses or adenopathy were noted.  Stensen's ducts showed clear fluid at that time.  Discussed the  increased risk for developing lymphoma (4-14x fold risk), neuropathy, and interstitial lung disease in patients with Sjogren's syndrome.  High risk medication use - Planning to reinitiate in-office Cimzia pending lab results.  CBC and CMP updated on 12/29/22.  Orders for CBC and CMP released today.  Her next lab work will be due in January and every 3 months.  She will require close lab monitoring due to history of neutropenia. TB gold negative 07/15/22.  Discussed the importance of holding Cimzia if she develops signs or symptoms of an infection and to resume once the infection is completely cleared. - Plan: COMPLETE METABOLIC PANEL WITH GFR, CBC with Differential/Platelet  Other neutropenia (HCC) -White blood cell count was 3.8 and absolute neutrophils were within normal limits on 12/29/2022.  CBC with differential updated today.  Plan: CBC with Differential/Platelet  Primary osteoarthritis of both hands: PIP and DIP thickening consistent with osteoarthritis of both hands.  Tenderness and synovitis involving the left second PIP joint noted on examination today.  Primary osteoarthritis of both feet: She is not experiencing any increased discomfort in her feet at this time.  DDD (degenerative disc disease), cervical - CT maxillofacial 10/04/2022 revealed chronic cervical spine degeneration.  Arthropathy of lumbar facet joint: She continues to experience intermittent discomfort in her lower back.  She has intermittent symptoms of sciatica-asymptomatic currently.   Trochanteric bursitis, right hip: Not currently symptomatic.   Osteopenia of multiple sites: DEXA updated on 10/28/2020 AP spine BMD 0.921 with T-score -1.1. -4%  change in BMD. Patient due to update DEXA.   Other medical conditions are listed as follows:   History of scoliosis  History of migraine  History of IBS  History of hypothyroidism  History of hypertension  History of gastroesophageal reflux (GERD)  History of bradycardia/ sinoatrial node dysfunction   Orders: Orders Placed This Encounter  Procedures   COMPLETE METABOLIC PANEL WITH GFR   CBC with Differential/Platelet   No orders of the defined types were placed in this encounter.    Follow-Up Instructions: Return in about 3 months (around 05/25/2023) for Rheumatoid arthritis.   Gearldine Bienenstock, PA-C  Note - This record has been created using Dragon software.  Chart creation errors have been sought, but may not always  have been located. Such creation errors do not reflect on  the standard of medical care.

## 2023-02-22 ENCOUNTER — Ambulatory Visit: Payer: Managed Care, Other (non HMO) | Attending: Physician Assistant | Admitting: Physician Assistant

## 2023-02-22 ENCOUNTER — Encounter: Payer: Self-pay | Admitting: *Deleted

## 2023-02-22 ENCOUNTER — Encounter: Payer: Self-pay | Admitting: Physician Assistant

## 2023-02-22 VITALS — BP 116/69 | HR 42 | Resp 14 | Ht 68.5 in | Wt 132.0 lb

## 2023-02-22 DIAGNOSIS — Z8669 Personal history of other diseases of the nervous system and sense organs: Secondary | ICD-10-CM

## 2023-02-22 DIAGNOSIS — M0579 Rheumatoid arthritis with rheumatoid factor of multiple sites without organ or systems involvement: Secondary | ICD-10-CM | POA: Diagnosis not present

## 2023-02-22 DIAGNOSIS — Z8739 Personal history of other diseases of the musculoskeletal system and connective tissue: Secondary | ICD-10-CM

## 2023-02-22 DIAGNOSIS — M3509 Sicca syndrome with other organ involvement: Secondary | ICD-10-CM

## 2023-02-22 DIAGNOSIS — M19042 Primary osteoarthritis, left hand: Secondary | ICD-10-CM

## 2023-02-22 DIAGNOSIS — H209 Unspecified iridocyclitis: Secondary | ICD-10-CM

## 2023-02-22 DIAGNOSIS — M8589 Other specified disorders of bone density and structure, multiple sites: Secondary | ICD-10-CM

## 2023-02-22 DIAGNOSIS — Z8679 Personal history of other diseases of the circulatory system: Secondary | ICD-10-CM

## 2023-02-22 DIAGNOSIS — M5030 Other cervical disc degeneration, unspecified cervical region: Secondary | ICD-10-CM

## 2023-02-22 DIAGNOSIS — Z79899 Other long term (current) drug therapy: Secondary | ICD-10-CM

## 2023-02-22 DIAGNOSIS — Z87898 Personal history of other specified conditions: Secondary | ICD-10-CM

## 2023-02-22 DIAGNOSIS — Z8639 Personal history of other endocrine, nutritional and metabolic disease: Secondary | ICD-10-CM

## 2023-02-22 DIAGNOSIS — M19072 Primary osteoarthritis, left ankle and foot: Secondary | ICD-10-CM

## 2023-02-22 DIAGNOSIS — Z8719 Personal history of other diseases of the digestive system: Secondary | ICD-10-CM

## 2023-02-22 DIAGNOSIS — M7061 Trochanteric bursitis, right hip: Secondary | ICD-10-CM

## 2023-02-22 DIAGNOSIS — D708 Other neutropenia: Secondary | ICD-10-CM

## 2023-02-22 DIAGNOSIS — M19041 Primary osteoarthritis, right hand: Secondary | ICD-10-CM

## 2023-02-22 DIAGNOSIS — M47816 Spondylosis without myelopathy or radiculopathy, lumbar region: Secondary | ICD-10-CM

## 2023-02-22 DIAGNOSIS — M19071 Primary osteoarthritis, right ankle and foot: Secondary | ICD-10-CM

## 2023-02-23 LAB — COMPLETE METABOLIC PANEL WITH GFR
AG Ratio: 1.2 (calc) (ref 1.0–2.5)
ALT: 16 U/L (ref 6–29)
AST: 27 U/L (ref 10–35)
Albumin: 4.2 g/dL (ref 3.6–5.1)
Alkaline phosphatase (APISO): 72 U/L (ref 37–153)
BUN: 13 mg/dL (ref 7–25)
CO2: 27 mmol/L (ref 20–32)
Calcium: 10.2 mg/dL (ref 8.6–10.4)
Chloride: 105 mmol/L (ref 98–110)
Creat: 0.82 mg/dL (ref 0.50–1.03)
Globulin: 3.6 g/dL (ref 1.9–3.7)
Glucose, Bld: 87 mg/dL (ref 65–99)
Potassium: 4.4 mmol/L (ref 3.5–5.3)
Sodium: 141 mmol/L (ref 135–146)
Total Bilirubin: 0.4 mg/dL (ref 0.2–1.2)
Total Protein: 7.8 g/dL (ref 6.1–8.1)
eGFR: 82 mL/min/{1.73_m2} (ref >=60)

## 2023-02-23 LAB — CBC WITH DIFFERENTIAL/PLATELET
Absolute Monocytes: 536 {cells}/uL (ref 200–950)
Basophils Absolute: 31 {cells}/uL (ref 0–200)
Basophils Relative: 1 %
Eosinophils Absolute: 81 {cells}/uL (ref 15–500)
Eosinophils Relative: 2.6 %
HCT: 38.6 % (ref 35.0–45.0)
Hemoglobin: 12 g/dL (ref 11.7–15.5)
Lymphs Abs: 1395 {cells}/uL (ref 850–3900)
MCH: 27.8 pg (ref 27.0–33.0)
MCHC: 31.1 g/dL — ABNORMAL LOW (ref 32.0–36.0)
MCV: 89.4 fL (ref 80.0–100.0)
MPV: 12.2 fL (ref 7.5–12.5)
Monocytes Relative: 17.3 %
Neutro Abs: 1057 {cells}/uL — ABNORMAL LOW (ref 1500–7800)
Neutrophils Relative %: 34.1 %
Platelets: 160 10*3/uL (ref 140–400)
RBC: 4.32 10*6/uL (ref 3.80–5.10)
RDW: 11.7 % (ref 11.0–15.0)
Total Lymphocyte: 45 %
WBC: 3.1 10*3/uL — ABNORMAL LOW (ref 3.8–10.8)

## 2023-02-23 NOTE — Progress Notes (Signed)
CMP WNL  WBC count is low but stable. Absolute neutrophils low.  We will continue to monitor.  Recheck CBC with diff and CMP 1 month after restarting cimzia.  Ok to reinitiate in-office cimzia.

## 2023-03-03 ENCOUNTER — Telehealth: Payer: Self-pay | Admitting: Rheumatology

## 2023-03-03 NOTE — Telephone Encounter (Signed)
Patient left a voicemail requesting a return call to reschedule her Cimzia appointment.

## 2023-03-03 NOTE — Telephone Encounter (Signed)
Returned call to the patient. Patient requested an appointment for Monday, Tuesday or Friday. Patient advised Cimzia injections are only scheduled for Thursday afternoons. Patient elected to keep her current appointment date and time.

## 2023-03-04 ENCOUNTER — Encounter: Payer: Self-pay | Admitting: *Deleted

## 2023-03-04 ENCOUNTER — Ambulatory Visit: Payer: Managed Care, Other (non HMO) | Attending: Rheumatology | Admitting: *Deleted

## 2023-03-04 VITALS — BP 143/72 | HR 47

## 2023-03-04 DIAGNOSIS — M0579 Rheumatoid arthritis with rheumatoid factor of multiple sites without organ or systems involvement: Secondary | ICD-10-CM | POA: Diagnosis not present

## 2023-03-04 MED ORDER — CERTOLIZUMAB PEGOL 2 X 200 MG ~~LOC~~ KIT
400.0000 mg | PACK | Freq: Once | SUBCUTANEOUS | Status: AC
Start: 2023-03-04 — End: 2023-03-04
  Administered 2023-03-04: 400 mg via SUBCUTANEOUS

## 2023-03-04 NOTE — Progress Notes (Signed)
Subjective:   Patient presents to clinic today to receive monthly dose of Cimzia.  Patient running a fever or have signs/symptoms of infection? No  Patient currently on antibiotics for the treatment of infection? No  Patient have any upcoming invasive procedures/surgeries? No  Objective: CMP     Component Value Date/Time   NA 141 02/22/2023 1546   NA 144 04/08/2021 1216   K 4.4 02/22/2023 1546   CL 105 02/22/2023 1546   CO2 27 02/22/2023 1546   GLUCOSE 87 02/22/2023 1546   BUN 13 02/22/2023 1546   BUN 15 04/08/2021 1216   CREATININE 0.82 02/22/2023 1546   CALCIUM 10.2 02/22/2023 1546   PROT 7.8 02/22/2023 1546   PROT 7.9 04/08/2021 1216   ALBUMIN 4.0 10/04/2022 0703   ALBUMIN 4.5 04/08/2021 1216   AST 27 02/22/2023 1546   AST 21 02/18/2021 1452   ALT 16 02/22/2023 1546   ALT 13 02/18/2021 1452   ALKPHOS 54 10/04/2022 0703   BILITOT 0.4 02/22/2023 1546   BILITOT 0.3 04/08/2021 1216   BILITOT 0.4 02/18/2021 1452   GFRNONAA >60 10/04/2022 0703   GFRNONAA 53 (L) 02/18/2021 1452   GFRNONAA 77 02/15/2020 1023   GFRAA 89 02/15/2020 1023    CBC    Component Value Date/Time   WBC 3.1 (L) 02/22/2023 1546   RBC 4.32 02/22/2023 1546   HGB 12.0 02/22/2023 1546   HGB 12.3 04/08/2021 1216   HGB 11.2 (L) 07/26/2013 0802   HCT 38.6 02/22/2023 1546   HCT 39.3 04/08/2021 1216   HCT 35.5 07/26/2013 0802   PLT 160 02/22/2023 1546   PLT 140 (L) 04/08/2021 1216   MCV 89.4 02/22/2023 1546   MCV 90 04/08/2021 1216   MCV 86 07/26/2013 0802   MCH 27.8 02/22/2023 1546   MCHC 31.1 (L) 02/22/2023 1546   RDW 11.7 02/22/2023 1546   RDW 12.0 04/08/2021 1216   RDW 13.6 07/26/2013 0802   LYMPHSABS 1,395 02/22/2023 1546   LYMPHSABS 2.0 04/08/2021 1216   LYMPHSABS 0.9 07/26/2013 0802   MONOABS 1.4 (H) 10/04/2022 0703   EOSABS 81 02/22/2023 1546   EOSABS 0.0 04/08/2021 1216   EOSABS 0.0 07/26/2013 0802   BASOSABS 31 02/22/2023 1546   BASOSABS 0.0 04/08/2021 1216   BASOSABS 0.0  07/26/2013 0802    Baseline Immunosuppressant Therapy Labs TB GOLD    Latest Ref Rng & Units 07/15/2022    3:41 PM  Quantiferon TB Gold  Quantiferon TB Gold Plus NEGATIVE NEGATIVE    Hepatitis Panel    Latest Ref Rng & Units 06/23/2018    3:51 PM  Hepatitis  Hep B Surface Ag NON-REACTI NON-REACTIVE   Hep B IgM NON-REACTI NON-REACTIVE   Hep C Ab NON-REACTI NON-REACTIVE   Hep A IgM NON-REACTI NON-REACTIVE    HIV Lab Results  Component Value Date   HIV NONREACTIVE 12/21/2014   Immunoglobulins    Latest Ref Rng & Units 06/23/2018    3:51 PM  Immunoglobulin Electrophoresis  IgA  47 - 310 mg/dL 308   IgG 657 - 8,469 mg/dL 6,295   IgM 50 - 284 mg/dL 132    SPEP    Latest Ref Rng & Units 02/22/2023    3:46 PM  Serum Protein Electrophoresis  Total Protein 6.1 - 8.1 g/dL 7.8    G4WN No results found for: "G6PDH" TPMT No results found for: "TPMT"   Chest x-ray: 09/16/20-Stable exam without acute or active cardiopulmonary disease.   Assessment/Plan:   Administrations  This Visit     certolizumab pegol (CIMZIA) kit 400 mg     Admin Date 03/04/2023 Action Given Dose 400 mg Route Subcutaneous Documented By Henriette Combs, LPN             Patient tolerated injection well.   Appointment for next injection scheduled for 04/01/2023.  Patient due for labs in November 2024.  Patient is to call and reschedule appointment if running a fever with signs/symptoms of infection, on antibiotics for active infection or has an upcoming invasive procedure.  All questions encouraged and answered.  Instructed patient to call with any further questions or concerns.

## 2023-03-08 ENCOUNTER — Telehealth: Payer: Self-pay | Admitting: *Deleted

## 2023-03-08 NOTE — Telephone Encounter (Signed)
Patient advised FMLA paperwork has been completed and faxed. Patient advised copy of paperwork is at the front desk to pick up.

## 2023-04-01 ENCOUNTER — Ambulatory Visit: Payer: Managed Care, Other (non HMO) | Attending: Rheumatology | Admitting: *Deleted

## 2023-04-01 ENCOUNTER — Encounter: Payer: Self-pay | Admitting: *Deleted

## 2023-04-01 VITALS — BP 131/81 | HR 51

## 2023-04-01 DIAGNOSIS — Z79899 Other long term (current) drug therapy: Secondary | ICD-10-CM

## 2023-04-01 DIAGNOSIS — M0579 Rheumatoid arthritis with rheumatoid factor of multiple sites without organ or systems involvement: Secondary | ICD-10-CM | POA: Diagnosis not present

## 2023-04-01 MED ORDER — CERTOLIZUMAB PEGOL 2 X 200 MG ~~LOC~~ KIT
400.0000 mg | PACK | Freq: Once | SUBCUTANEOUS | Status: AC
Start: 2023-04-01 — End: 2023-04-01
  Administered 2023-04-01: 400 mg via SUBCUTANEOUS

## 2023-04-01 NOTE — Progress Notes (Signed)
Subjective:   Patient presents to clinic today to receive monthly dose of Cimzia.  Patient running a fever or have signs/symptoms of infection? No  Patient currently on antibiotics for the treatment of infection? No  Patient have any upcoming invasive procedures/surgeries? No  Objective: CMP     Component Value Date/Time   NA 141 02/22/2023 1546   NA 144 04/08/2021 1216   K 4.4 02/22/2023 1546   CL 105 02/22/2023 1546   CO2 27 02/22/2023 1546   GLUCOSE 87 02/22/2023 1546   BUN 13 02/22/2023 1546   BUN 15 04/08/2021 1216   CREATININE 0.82 02/22/2023 1546   CALCIUM 10.2 02/22/2023 1546   PROT 7.8 02/22/2023 1546   PROT 7.9 04/08/2021 1216   ALBUMIN 4.0 10/04/2022 0703   ALBUMIN 4.5 04/08/2021 1216   AST 27 02/22/2023 1546   AST 21 02/18/2021 1452   ALT 16 02/22/2023 1546   ALT 13 02/18/2021 1452   ALKPHOS 54 10/04/2022 0703   BILITOT 0.4 02/22/2023 1546   BILITOT 0.3 04/08/2021 1216   BILITOT 0.4 02/18/2021 1452   GFRNONAA >60 10/04/2022 0703   GFRNONAA 53 (L) 02/18/2021 1452   GFRNONAA 77 02/15/2020 1023   GFRAA 89 02/15/2020 1023    CBC    Component Value Date/Time   WBC 3.1 (L) 02/22/2023 1546   RBC 4.32 02/22/2023 1546   HGB 12.0 02/22/2023 1546   HGB 12.3 04/08/2021 1216   HGB 11.2 (L) 07/26/2013 0802   HCT 38.6 02/22/2023 1546   HCT 39.3 04/08/2021 1216   HCT 35.5 07/26/2013 0802   PLT 160 02/22/2023 1546   PLT 140 (L) 04/08/2021 1216   MCV 89.4 02/22/2023 1546   MCV 90 04/08/2021 1216   MCV 86 07/26/2013 0802   MCH 27.8 02/22/2023 1546   MCHC 31.1 (L) 02/22/2023 1546   RDW 11.7 02/22/2023 1546   RDW 12.0 04/08/2021 1216   RDW 13.6 07/26/2013 0802   LYMPHSABS 1,395 02/22/2023 1546   LYMPHSABS 2.0 04/08/2021 1216   LYMPHSABS 0.9 07/26/2013 0802   MONOABS 1.4 (H) 10/04/2022 0703   EOSABS 81 02/22/2023 1546   EOSABS 0.0 04/08/2021 1216   EOSABS 0.0 07/26/2013 0802   BASOSABS 31 02/22/2023 1546   BASOSABS 0.0 04/08/2021 1216   BASOSABS 0.0  07/26/2013 0802    Baseline Immunosuppressant Therapy Labs TB GOLD    Latest Ref Rng & Units 07/15/2022    3:41 PM  Quantiferon TB Gold  Quantiferon TB Gold Plus NEGATIVE NEGATIVE    Hepatitis Panel    Latest Ref Rng & Units 06/23/2018    3:51 PM  Hepatitis  Hep B Surface Ag NON-REACTI NON-REACTIVE   Hep B IgM NON-REACTI NON-REACTIVE   Hep C Ab NON-REACTI NON-REACTIVE   Hep A IgM NON-REACTI NON-REACTIVE    HIV Lab Results  Component Value Date   HIV NONREACTIVE 12/21/2014   Immunoglobulins    Latest Ref Rng & Units 06/23/2018    3:51 PM  Immunoglobulin Electrophoresis  IgA  47 - 310 mg/dL 409   IgG 811 - 9,147 mg/dL 8,295   IgM 50 - 621 mg/dL 308    SPEP    Latest Ref Rng & Units 02/22/2023    3:46 PM  Serum Protein Electrophoresis  Total Protein 6.1 - 8.1 g/dL 7.8    M5HQ No results found for: "G6PDH" TPMT No results found for: "TPMT"   Chest x-ray: 09/16/20-Stable exam without acute or active cardiopulmonary disease.   Assessment/Plan:   Administrations  This Visit     certolizumab pegol (CIMZIA) kit 400 mg     Admin Date 04/01/2023 Action Given Dose 400 mg Route Subcutaneous Documented By Henriette Combs, LPN             Patient tolerated injection well.   Appointment for next injection scheduled for 04/29/2023.  Patient due for labs today and they were drawn in office.  Patient is to call and reschedule appointment if running a fever with signs/symptoms of infection, on antibiotics for active infection or has an upcoming invasive procedure.  All questions encouraged and answered.  Instructed patient to call with any further questions or concerns.

## 2023-04-02 LAB — COMPLETE METABOLIC PANEL WITH GFR
AG Ratio: 1.2 (calc) (ref 1.0–2.5)
ALT: 15 U/L (ref 6–29)
AST: 23 U/L (ref 10–35)
Albumin: 4.3 g/dL (ref 3.6–5.1)
Alkaline phosphatase (APISO): 69 U/L (ref 37–153)
BUN/Creatinine Ratio: 17 (calc) (ref 6–22)
BUN: 18 mg/dL (ref 7–25)
CO2: 29 mmol/L (ref 20–32)
Calcium: 10.2 mg/dL (ref 8.6–10.4)
Chloride: 104 mmol/L (ref 98–110)
Creat: 1.04 mg/dL — ABNORMAL HIGH (ref 0.50–1.03)
Globulin: 3.5 g/dL (ref 1.9–3.7)
Glucose, Bld: 87 mg/dL (ref 65–99)
Potassium: 4.1 mmol/L (ref 3.5–5.3)
Sodium: 140 mmol/L (ref 135–146)
Total Bilirubin: 0.4 mg/dL (ref 0.2–1.2)
Total Protein: 7.8 g/dL (ref 6.1–8.1)
eGFR: 62 mL/min/{1.73_m2} (ref >=60)

## 2023-04-02 LAB — CBC WITH DIFFERENTIAL/PLATELET
Absolute Lymphocytes: 1934 {cells}/uL (ref 850–3900)
Absolute Monocytes: 513 {cells}/uL (ref 200–950)
Basophils Absolute: 42 {cells}/uL (ref 0–200)
Basophils Relative: 1.1 %
Eosinophils Absolute: 49 {cells}/uL (ref 15–500)
Eosinophils Relative: 1.3 %
HCT: 38.7 % (ref 35.0–45.0)
Hemoglobin: 12.1 g/dL (ref 11.7–15.5)
MCH: 27.8 pg (ref 27.0–33.0)
MCHC: 31.3 g/dL — ABNORMAL LOW (ref 32.0–36.0)
MCV: 89 fL (ref 80.0–100.0)
MPV: 12.1 fL (ref 7.5–12.5)
Monocytes Relative: 13.5 %
Neutro Abs: 1262 {cells}/uL — ABNORMAL LOW (ref 1500–7800)
Neutrophils Relative %: 33.2 %
Platelets: 160 10*3/uL (ref 140–400)
RBC: 4.35 10*6/uL (ref 3.80–5.10)
RDW: 11.8 % (ref 11.0–15.0)
Total Lymphocyte: 50.9 %
WBC: 3.8 10*3/uL (ref 3.8–10.8)

## 2023-04-02 NOTE — Progress Notes (Signed)
CBC and CMP rre normal except creatinine is mildly elevated.  Patient should avoid NSAIDs and increase water intake.  Please forward results to her PCP.

## 2023-04-23 ENCOUNTER — Telehealth: Payer: Self-pay | Admitting: Pharmacist

## 2023-04-23 NOTE — Telephone Encounter (Signed)
ATC patient regarding Cimzia reverification of benefits. Requested return call ONLY if she anticipates insurance changes  Benefit reverification request submitted on [employer] portal since deadline is upcoming Monday.  Chesley Mires, PharmD, MPH, BCPS, CPP Clinical Pharmacist (Rheumatology and Pulmonology)

## 2023-04-26 ENCOUNTER — Ambulatory Visit: Payer: Managed Care, Other (non HMO) | Admitting: Family Medicine

## 2023-04-26 ENCOUNTER — Ambulatory Visit (HOSPITAL_BASED_OUTPATIENT_CLINIC_OR_DEPARTMENT_OTHER)
Admission: RE | Admit: 2023-04-26 | Discharge: 2023-04-26 | Disposition: A | Discharge status: Home / Self Care | Payer: Managed Care, Other (non HMO) | Source: Ambulatory Visit | Attending: Family Medicine | Admitting: Family Medicine

## 2023-04-26 ENCOUNTER — Encounter: Payer: Self-pay | Admitting: Family Medicine

## 2023-04-26 VITALS — BP 137/66 | HR 100 | Ht 68.5 in | Wt 137.0 lb

## 2023-04-26 DIAGNOSIS — M545 Low back pain, unspecified: Secondary | ICD-10-CM | POA: Insufficient documentation

## 2023-04-26 DIAGNOSIS — S70311A Abrasion, right thigh, initial encounter: Secondary | ICD-10-CM

## 2023-04-26 DIAGNOSIS — T148XXA Other injury of unspecified body region, initial encounter: Secondary | ICD-10-CM

## 2023-04-26 MED ORDER — MUPIROCIN 2 % EX OINT: 1.0000 | TOPICAL_OINTMENT | Freq: Two times a day (BID) | CUTANEOUS | 1 refills | Status: DC

## 2023-04-26 NOTE — Progress Notes (Signed)
Acute Office Visit  Subjective:     Patient ID: MELISA COLGAN, female    DOB: [birth year], 57 y.o.   MRN: [id number]  Chief Complaint  Patient presents with   Back Pain    Patient is in today for back pain/injury.  Discussed the use of AI scribe software for clinical note transcription with the patient, who gave verbal consent to proceed.  History of Present Illness   Ms. Bright, a patient with presents with acute onset of mid-back pain that started the previous day - she was bending/twisting to move some boxes and heard/felt a "pop". The pain is described as deep and located in the middle of the back, radiating to the right side. The pain is exacerbated by standing and walking, which the patient describes as feeling like "pressure is pushing everything down." The pain is severe, rated between 8 and 9 on a scale of 10. The patient has not taken any over-the-counter pain medications.  The patient scratched her leg during sleep a week ago, resulting in a sore that has become red and slightly painful. There has been no pus or drainage from the wound.   The patient prefers to manage pain with natural remedies and is hesitant to take medications. However, she is open to trying a lidocaine patch for the current back pain. The patient also expresses willingness to take Tylenol and to consider physical therapy or sports medicine for further management.             All review of systems negative except what is listed in the HPI      Objective:    BP 137/66   Pulse 100   Ht 5' 8.5" (1.74 m)   Wt 137 lb (62.1 kg)   LMP 05/01/2016   SpO2 100%   BMI 20.53 kg/m    Physical Exam Vitals reviewed.  Constitutional:      Appearance: Normal appearance.  Musculoskeletal:     Comments: Lumbar spine tender to palpation  Neurological:     Mental Status: She is alert and oriented to person, place, and time.  Psychiatric:        Mood and Affect: Mood normal.        Behavior:  Behavior normal.        Thought Content: Thought content normal.        Judgment: Judgment normal.     No results found for any visits on 04/26/23.      Assessment & Plan:   Problem List Items Addressed This Visit       Active Problems   LOW BACK PAIN   Relevant Orders   DG Lumbar Spine 2-3 Views   Other Visit Diagnoses     Abrasion    -  Primary   Relevant Medications   mupirocin ointment (BACTROBAN) 2 %         Acute Back Pain Onset yesterday after a sudden movement. Pain is deep and localized to the mid-back. Pain is worse with standing and walking, and relieved by sitting. No radicular symptoms. Patient prefers non-pharmacological interventions. -Requesting xray given sudden onset with "popping" sensation. -Heat, ice, massage, rest, home exercises recommended. -Consider Lidocaine patch for local pain relief. -Consider referral to physical therapy or sports medicine for targeted exercises.  Abrasion on Right Thigh Patient reports scratching her leg in her sleep last week, resulting in a sore that is now red and aching. No pus or drainage noted. Erythema localized to borders of abrasion. -  Try mupirocin to the area after cleaning and pat dry. Warm compresses. -If erythema spreads, consider oral antibiotics.        Meds ordered this encounter  Medications   mupirocin ointment (BACTROBAN) 2 %    Sig: Apply 1 Application topically 2 (two) times daily.    Dispense:  22 g    Refill:  1    Order Specific Question:   Supervising Provider    Answer:   Danise Edge A [[id number]]    Return if symptoms worsen or fail to improve.  Clayborne Dana, NP

## 2023-04-28 ENCOUNTER — Telehealth: Payer: Self-pay | Admitting: Rheumatology

## 2023-04-28 NOTE — Telephone Encounter (Signed)
Patient left a voicemail cancelling her Cimzia injection scheduled for tomorrow, 04/29/23.  Patient states she is congested and "dealing with a leg injury."  Patient requested a return call to reschedule.

## 2023-04-28 NOTE — Telephone Encounter (Signed)
Patient states she is having some sinus trouble but has not seen her PCP or urgent care at this time. Patient has rescheduled her Cimzia for 05/13/2023 and states if she is not feeling better by the weekend she will go to urgent care.

## 2023-04-29 ENCOUNTER — Ambulatory Visit: Payer: Managed Care, Other (non HMO)

## 2023-05-13 ENCOUNTER — Ambulatory Visit: Payer: Managed Care, Other (non HMO)

## 2023-05-17 NOTE — Progress Notes (Deleted)
 Office Visit Note  Patient: Brittany Hodges             Date of Birth: 12-Nov-1965           MRN: [id number]             PCP: Amon Aloysius BRAVO, MD Referring: Amon Aloysius BRAVO, MD Visit Date: 05/31/2023 Occupation: @GUAROCC @  Subjective:  No chief complaint on file.   History of Present Illness: Brittany Hodges is a 57 y.o. female ***   Ro>8. La-: Evaluated by ENT--diagnosed with diffuse bilateral parotid sialolithiasis-CT 10/04/22-advanced chronic bilateral parotid gland sialolithiasis re-demonstrated.  No evidence of parotid inflammation or parotid duct enlargement.  Ro antibody >8 on 11/17/22.  La antibody negative.   Discussed the concern for Sjogren's syndrome at her last office visit.  Patient has mild eye dryness and nose dryness but has not been experiencing any mouth dryness.  Discussed the importance of close monitoring by ophthalmology as well as having dental visits at least every 6 months.  She was last seen by ENT on 12/31/2022--no sign of active infection-no masses or adenopathy were noted.  Stensen's ducts showed clear fluid at that time.   Activities of Daily Living:  Patient reports morning stiffness for *** {minute/hour:19697}.   Patient {ACTIONS;DENIES/REPORTS:21021675::Denies} nocturnal pain.  Difficulty dressing/grooming: {ACTIONS;DENIES/REPORTS:21021675::Denies} Difficulty climbing stairs: {ACTIONS;DENIES/REPORTS:21021675::Denies} Difficulty getting out of chair: {ACTIONS;DENIES/REPORTS:21021675::Denies} Difficulty using hands for taps, buttons, cutlery, and/or writing: {ACTIONS;DENIES/REPORTS:21021675::Denies}  No Rheumatology ROS completed.   PMFS History:  Patient Active Problem List   Diagnosis Date Noted   Osteopenia 01/27/2022   Bradycardia 03/06/2021   Benign paroxysmal positional vertigo 03/06/2021   Mild intermittent asthma 12/22/2020   Chronic sinusitis 09/14/2019   Intolerance, food 09/14/2019   Asthmatic bronchitis 04/06/2016    Laryngopharyngeal reflux (LPR) 04/06/2016   PCP NOTES >>>>> 03/09/2015   Annual physical exam 12/21/2014   Cardiomegaly, by MRI 03/16/2014   Leukopenia 03/10/2014   Dizziness 02/10/2011   SINOATRIAL NODE DYSFUNCTION 08/06/2008   ATRIAL SEPTAL DEFECT 06/29/2007   Hypothyroidism 03/21/2007   Essential hypertension 03/21/2007   Perennial allergic rhinitis 03/21/2007   GERD-- dysphagia 03/21/2007   CONSTIPATION 03/21/2007   IRRITABLE BOWEL SYNDROME 03/21/2007   RA (rheumatoid arthritis) (HCC) 03/21/2007   LOW BACK PAIN 03/21/2007   FIBROMYALGIA 03/21/2007    Past Medical History:  Diagnosis Date   Allergic rhinitis, cause unspecified    Anemia    Asthma    Atrial septal defect    s/p repair;   Echo 08/07/10: EF 55-60%; mild MR; atrial septal aneurysm; no residual ASD   Chest pain, unspecified    cardiac cath 07/31/10: normal coronaries; vigorous LVF   Esophageal reflux    Fibromyalgia    Headache(784.0)    Hematuria, unspecified    Hypertension    no meds    Hypothyroidism    Irritable bowel syndrome    Low back pain    Rheumatoid arthritis(714.0)    Dr Margarita   Scoliosis    Sinoatrial node dysfunction (HCC)    eval. for chronotropic competence completed in past   Sjogren's disease (HCC)     Family History  Problem Relation Age of Onset   Hypertension Mother    Diabetes Mother    Kidney failure Mother    Glaucoma Father    Hypertension Sister    Thyroid  disease Sister    Hypertension Sister    Thyroid  disease Sister    Heart disease Maternal Grandmother    Stomach cancer  Maternal Grandmother 95   Rheum arthritis Maternal Grandmother    Sinusitis Daughter    Asthma Son    Eczema Son    Breast cancer Other        aunt   Diabetes Other        GM   Colon cancer Neg Hx    Allergic rhinitis Neg Hx    Angioedema Neg Hx    Immunodeficiency Neg Hx    Past Surgical History:  Procedure Laterality Date   ASD REPAIR  1985   TONSILLECTOMY AND ADENOIDECTOMY      TUBAL LIGATION     Social History   Social History Narrative   Household-- lives by herself   Immunization History  Administered Date(s) Administered   Influenza Split 03/17/2011, 03/17/2012   Influenza Whole 04/05/2009, 02/21/2010   Influenza, Seasonal, Injecte, Preservative Fre 03/11/2021   Influenza,inj,Quad PF,6+ Mos 03/21/2013, 03/09/2014, 05/24/2015, 01/27/2016   Influenza-Unspecified 07/30/2020, 02/17/2022   PFIZER(Purple Top)SARS-COV-2 Vaccination 12/16/2019, 01/16/2020   Pneumococcal Conjugate-13 01/27/2016   Pneumococcal Polysaccharide-23 03/08/2015   Td 07/23/2010   Tdap 05/28/2020, 02/25/2022     Objective: Vital Signs: LMP 05/01/2016    Physical Exam   Musculoskeletal Exam: ***  CDAI Exam: CDAI Score: -- Patient Global: --; Provider Global: -- Swollen: --; Tender: -- Joint Exam 05/31/2023   No joint exam has been documented for this visit   There is currently no information documented on the homunculus. Go to the Rheumatology activity and complete the homunculus joint exam.  Investigation: No additional findings.  Imaging: DG Lumbar Spine 2-3 Views Result Date: 05/05/2023 CLINICAL DATA:  Back pain. Felt a pop and developed pain in the lower back when twisting/pending last night. EXAM: LUMBAR SPINE - 2-3 VIEW COMPARISON:  None Available. FINDINGS: No fracture, bone lesion or malalignment. Disc spaces are well preserved. Soft tissues are unremarkable. IMPRESSION: Negative. Electronically Signed   By: Alm Parkins M.D.   On: 05/05/2023 09:33    Recent Labs: Lab Results  Component Value Date   WBC 3.8 04/01/2023   HGB 12.1 04/01/2023   PLT 160 04/01/2023   NA 140 04/01/2023   K 4.1 04/01/2023   CL 104 04/01/2023   CO2 29 04/01/2023   GLUCOSE 87 04/01/2023   BUN 18 04/01/2023   CREATININE 1.04 (H) 04/01/2023   BILITOT 0.4 04/01/2023   ALKPHOS 54 10/04/2022   AST 23 04/01/2023   ALT 15 04/01/2023   PROT 7.8 04/01/2023   ALBUMIN 4.0 10/04/2022    CALCIUM 10.2 04/01/2023   GFRAA 89 02/15/2020   QFTBGOLDPLUS NEGATIVE 07/15/2022    Speciality Comments: Dexa- 05/27/18 Osteopenia T-Score -1.7 BMD 0.963  Prior therapy includes: Plaquenil (inadequate response) , Enbrel (injection site reaction) and methotrexate  (neutropenia so on low dose).  Procedures:  No procedures performed Allergies: Etanercept, Methotrexate  derivatives, and Pregabalin   Assessment / Plan:     Visit Diagnoses: Rheumatoid arthritis involving multiple sites with positive rheumatoid factor (HCC)  High risk medication use  Iritis  Sjogren's syndrome with other organ involvement (HCC)  Other neutropenia (HCC)  Primary osteoarthritis of both hands  Primary osteoarthritis of both feet  DDD (degenerative disc disease), cervical  Arthropathy of lumbar facet joint  Trochanteric bursitis, right hip  Osteopenia of multiple sites  History of scoliosis  History of migraine  History of IBS  History of hypothyroidism  History of hypertension  History of gastroesophageal reflux (GERD)  History of bradycardia/ sinoatrial node dysfunction   Orders: No orders of the  defined types were placed in this encounter.  No orders of the defined types were placed in this encounter.   Face-to-face time spent with patient was *** minutes. Greater than 50% of time was spent in counseling and coordination of care.  Follow-Up Instructions: No follow-ups on file.   Waddell CHRISTELLA Craze, PA-C  Note - This record has been created using Dragon software.  Chart creation errors have been sought, but may not always  have been located. Such creation errors do not reflect on  the standard of medical care.

## 2023-05-20 ENCOUNTER — Encounter: Payer: Self-pay | Admitting: Physician Assistant

## 2023-05-20 ENCOUNTER — Ambulatory Visit: Payer: Managed Care, Other (non HMO) | Attending: Physician Assistant | Admitting: Physician Assistant

## 2023-05-20 VITALS — BP 146/74 | HR 58 | Resp 14 | Ht 68.5 in | Wt 135.0 lb

## 2023-05-20 DIAGNOSIS — M19042 Primary osteoarthritis, left hand: Secondary | ICD-10-CM

## 2023-05-20 DIAGNOSIS — M0579 Rheumatoid arthritis with rheumatoid factor of multiple sites without organ or systems involvement: Secondary | ICD-10-CM

## 2023-05-20 DIAGNOSIS — M7061 Trochanteric bursitis, right hip: Secondary | ICD-10-CM

## 2023-05-20 DIAGNOSIS — Z8739 Personal history of other diseases of the musculoskeletal system and connective tissue: Secondary | ICD-10-CM

## 2023-05-20 DIAGNOSIS — M8589 Other specified disorders of bone density and structure, multiple sites: Secondary | ICD-10-CM

## 2023-05-20 DIAGNOSIS — M3509 Sicca syndrome with other organ involvement: Secondary | ICD-10-CM

## 2023-05-20 DIAGNOSIS — M47816 Spondylosis without myelopathy or radiculopathy, lumbar region: Secondary | ICD-10-CM

## 2023-05-20 DIAGNOSIS — H209 Unspecified iridocyclitis: Secondary | ICD-10-CM | POA: Diagnosis not present

## 2023-05-20 DIAGNOSIS — D708 Other neutropenia: Secondary | ICD-10-CM

## 2023-05-20 DIAGNOSIS — Z8669 Personal history of other diseases of the nervous system and sense organs: Secondary | ICD-10-CM

## 2023-05-20 DIAGNOSIS — M5030 Other cervical disc degeneration, unspecified cervical region: Secondary | ICD-10-CM

## 2023-05-20 DIAGNOSIS — Z8679 Personal history of other diseases of the circulatory system: Secondary | ICD-10-CM

## 2023-05-20 DIAGNOSIS — Z87898 Personal history of other specified conditions: Secondary | ICD-10-CM

## 2023-05-20 DIAGNOSIS — Z79899 Other long term (current) drug therapy: Secondary | ICD-10-CM | POA: Diagnosis not present

## 2023-05-20 DIAGNOSIS — M19072 Primary osteoarthritis, left ankle and foot: Secondary | ICD-10-CM

## 2023-05-20 DIAGNOSIS — Z111 Encounter for screening for respiratory tuberculosis: Secondary | ICD-10-CM

## 2023-05-20 DIAGNOSIS — Z8719 Personal history of other diseases of the digestive system: Secondary | ICD-10-CM

## 2023-05-20 DIAGNOSIS — Z8639 Personal history of other endocrine, nutritional and metabolic disease: Secondary | ICD-10-CM

## 2023-05-20 DIAGNOSIS — M19041 Primary osteoarthritis, right hand: Secondary | ICD-10-CM

## 2023-05-20 DIAGNOSIS — M19071 Primary osteoarthritis, right ankle and foot: Secondary | ICD-10-CM

## 2023-05-20 NOTE — Progress Notes (Signed)
 Office Visit Note  Patient: Brittany Hodges             Date of Birth: 04/16/1966           MRN: [id number]             PCP: Amon Aloysius BRAVO, MD Referring: Amon Aloysius BRAVO, MD Visit Date: 05/20/2023 Occupation: @GUAROCC @  Subjective:  Discuss restarting cimzia   History of Present Illness: Brittany Hodges is a 58 y.o. female with history of rheumatoid arthritis and iritis.  Patient has received 2 doses of Cimzia  since her last office visit--1 dose administered on 03/04/2023 and the second dose was administered on 04/01/23.  She postponed her Cimzia  injection in December due to having an abrasion on her right upper leg followed by sinus congestion last week.  She did not require antibiotics.  Her symptoms have improved but she did wake up this morning with congestion which has improved throughout the day.  She is afebrile and would like to reschedule her Cimzia  injection for next week.  She has not yet noticed any improvement since reinitiating Cimzia .  She continues to have frequent iritis flares.  She has not seen her ophthalmologist recently.  She has been using prednisolone drops as needed during flares. Patient has noticed intermittent swelling in both hands.  She has been getting dry needling performed which has helped with her neck stiffness.    Activities of Daily Living:  Patient reports morning stiffness for 0 minute.   Patient Denies nocturnal pain.  Difficulty dressing/grooming: Denies Difficulty climbing stairs: Denies Difficulty getting out of chair: Denies Difficulty using hands for taps, buttons, cutlery, and/or writing: Denies  Review of Systems  Constitutional:  Negative for fatigue.  HENT:  Negative for mouth sores and mouth dryness.   Eyes:  Positive for dryness.  Respiratory:  Negative for shortness of breath.   Cardiovascular:  Negative for chest pain and palpitations.  Gastrointestinal:  Negative for blood in stool, constipation and diarrhea.  Endocrine:  Negative for increased urination.  Genitourinary:  Negative for involuntary urination.  Musculoskeletal:  Positive for joint pain, joint pain and joint swelling. Negative for gait problem, myalgias, muscle weakness, morning stiffness, muscle tenderness and myalgias.  Skin:  Negative for color change, rash, hair loss and sensitivity to sunlight.  Allergic/Immunologic: Negative for susceptible to infections.  Neurological:  Negative for dizziness and headaches.  Hematological:  Positive for swollen glands.  Psychiatric/Behavioral:  Negative for depressed mood and sleep disturbance. The patient is not nervous/anxious.     PMFS History:  Patient Active Problem List   Diagnosis Date Noted   Osteopenia 01/27/2022   Bradycardia 03/06/2021   Benign paroxysmal positional vertigo 03/06/2021   Mild intermittent asthma 12/22/2020   Chronic sinusitis 09/14/2019   Intolerance, food 09/14/2019   Asthmatic bronchitis 04/06/2016   Laryngopharyngeal reflux (LPR) 04/06/2016   PCP NOTES >>>>> 03/09/2015   Annual physical exam 12/21/2014   Cardiomegaly, by MRI 03/16/2014   Leukopenia 03/10/2014   Dizziness 02/10/2011   SINOATRIAL NODE DYSFUNCTION 08/06/2008   ATRIAL SEPTAL DEFECT 06/29/2007   Hypothyroidism 03/21/2007   Essential hypertension 03/21/2007   Perennial allergic rhinitis 03/21/2007   GERD-- dysphagia 03/21/2007   CONSTIPATION 03/21/2007   IRRITABLE BOWEL SYNDROME 03/21/2007   RA (rheumatoid arthritis) (HCC) 03/21/2007   LOW BACK PAIN 03/21/2007   FIBROMYALGIA 03/21/2007    Past Medical History:  Diagnosis Date   Allergic rhinitis, cause unspecified    Anemia    Asthma  Atrial septal defect    s/p repair;   Echo 08/07/10: EF 55-60%; mild MR; atrial septal aneurysm; no residual ASD   Chest pain, unspecified    cardiac cath 07/31/10: normal coronaries; vigorous LVF   Esophageal reflux    Fibromyalgia    Headache(784.0)    Hematuria, unspecified    Hypertension    no meds     Hypothyroidism    Irritable bowel syndrome    Low back pain    Rheumatoid arthritis(714.0)    Dr Margarita   Scoliosis    Sinoatrial node dysfunction (HCC)    eval. for chronotropic competence completed in past   Sjogren's disease (HCC)     Family History  Problem Relation Age of Onset   Hypertension Mother    Diabetes Mother    Kidney failure Mother    Glaucoma Father    Hypertension Sister    Thyroid  disease Sister    Hypertension Sister    Thyroid  disease Sister    Heart disease Maternal Grandmother    Stomach cancer Maternal Grandmother 95   Rheum arthritis Maternal Grandmother    Sinusitis Daughter    Asthma Son    Eczema Son    Breast cancer Other        aunt   Diabetes Other        GM   Colon cancer Neg Hx    Allergic rhinitis Neg Hx    Angioedema Neg Hx    Immunodeficiency Neg Hx    Past Surgical History:  Procedure Laterality Date   ASD REPAIR  1985   TONSILLECTOMY AND ADENOIDECTOMY     TUBAL LIGATION     Social History   Social History Narrative   Household-- lives by herself   Immunization History  Administered Date(s) Administered   Influenza Split 03/17/2011, 03/17/2012   Influenza Whole 04/05/2009, 02/21/2010   Influenza, Seasonal, Injecte, Preservative Fre 03/11/2021   Influenza,inj,Quad PF,6+ Mos 03/21/2013, 03/09/2014, 05/24/2015, 01/27/2016   Influenza-Unspecified 07/30/2020, 02/17/2022   PFIZER(Purple Top)SARS-COV-2 Vaccination 12/16/2019, 01/16/2020   Pneumococcal Conjugate-13 01/27/2016   Pneumococcal Polysaccharide-23 03/08/2015   Td 07/23/2010   Tdap 05/28/2020, 02/25/2022     Objective: Vital Signs: BP (!) 146/74 (BP Location: Left Arm, Patient Position: Sitting, Cuff Size: Normal)   Pulse (!) 58   Resp 14   Ht 5' 8.5 (1.74 m)   Wt 135 lb (61.2 kg)   LMP 05/01/2016   BMI 20.23 kg/m    Physical Exam Vitals and nursing note reviewed.  Constitutional:      Appearance: She is well-developed.  HENT:     Head:  Normocephalic and atraumatic.  Eyes:     Conjunctiva/sclera: Conjunctivae normal.  Cardiovascular:     Rate and Rhythm: Normal rate and regular rhythm.     Heart sounds: Normal heart sounds.  Pulmonary:     Effort: Pulmonary effort is normal.     Breath sounds: Normal breath sounds.  Abdominal:     General: Bowel sounds are normal.     Palpations: Abdomen is soft.  Musculoskeletal:     Cervical back: Normal range of motion.  Lymphadenopathy:     Cervical: No cervical adenopathy.  Skin:    General: Skin is warm and dry.     Capillary Refill: Capillary refill takes less than 2 seconds.  Neurological:     Mental Status: She is alert and oriented to person, place, and time.  Psychiatric:        Behavior: Behavior normal.  Musculoskeletal Exam: C-spine, thoracic spine, lumbar spine and good range of motion.  Shoulder joints, elbow joints, wrist joints, MCPs, PIPs, DIPs have good range of motion with no synovitis.  Complete fist formation bilaterally.  Hip joints have good range of motion with no groin pain.  Knee joints have good range of motion no warmth or effusion.  Ankle joints have good range of motion with no tenderness or joint swelling.  CDAI Exam: CDAI Score: -- Patient Global: --; Provider Global: -- Swollen: --; Tender: -- Joint Exam 05/20/2023   No joint exam has been documented for this visit   There is currently no information documented on the homunculus. Go to the Rheumatology activity and complete the homunculus joint exam.  Investigation: No additional findings.  Imaging: DG Lumbar Spine 2-3 Views Result Date: 05/05/2023 CLINICAL DATA:  Back pain. Felt a pop and developed pain in the lower back when twisting/pending last night. EXAM: LUMBAR SPINE - 2-3 VIEW COMPARISON:  None Available. FINDINGS: No fracture, bone lesion or malalignment. Disc spaces are well preserved. Soft tissues are unremarkable. IMPRESSION: Negative. Electronically Signed   By: Alm Parkins M.D.   On: 05/05/2023 09:33    Recent Labs: Lab Results  Component Value Date   WBC 3.8 04/01/2023   HGB 12.1 04/01/2023   PLT 160 04/01/2023   NA 140 04/01/2023   K 4.1 04/01/2023   CL 104 04/01/2023   CO2 29 04/01/2023   GLUCOSE 87 04/01/2023   BUN 18 04/01/2023   CREATININE 1.04 (H) 04/01/2023   BILITOT 0.4 04/01/2023   ALKPHOS 54 10/04/2022   AST 23 04/01/2023   ALT 15 04/01/2023   PROT 7.8 04/01/2023   ALBUMIN 4.0 10/04/2022   CALCIUM 10.2 04/01/2023   GFRAA 89 02/15/2020   QFTBGOLDPLUS NEGATIVE 07/15/2022    Speciality Comments: Dexa- 05/27/18 Osteopenia T-Score -1.7 BMD 0.963  Prior therapy includes: Plaquenil (inadequate response) , Enbrel (injection site reaction) and methotrexate  (neutropenia so on low dose).  Procedures:  No procedures performed Allergies: Etanercept, Methotrexate  derivatives, and Pregabalin     Assessment / Plan:     Visit Diagnoses: Rheumatoid arthritis involving multiple sites with positive rheumatoid factor (HCC): She has no synovitis on examination today.  She continues to experience intermittent discomfort and inflammation in both hands.  She is currently prescribed Cimzia  400 mg subcu injections once monthly.  She has had a total of 2 doses of Cimzia  since her last office visit--most recent injection was administered on 04/01/2023.  She has been tolerating Cimzia  without any side effects or injection site reactions.  She has not yet noticed any clinical benefit since reinitiating therapy.  Patient is planning to schedule her next dose of Cimzia  next week.  She is willing to give Cimzia  more time without any interruption prior to making any medication changes.  Discussed that she would benefit from adding on methotrexate  for better management of rheumatoid arthritis and iritis but we are unable to do so due to history of neutropenia.  She will continue to require close lab monitoring while on Cimzia  as monotherapy.  Encouraged the patient  to continue to follow-up closely with ophthalmology as well.  She will follow-up in the office in 2 to 3 months or sooner if needed.  High risk medication use: Cimzia  400 mg sq injections once monthly-in-office administered.  Last dose 04/01/23.  CBC and CMP updated on 04/01/23. Orders for CBC and CMP released today.  TB gold negative on 07/15/22.  Discussed the importance  of holding cimzia  if she develops signs or symptoms of an infection and to resume once the infection has completely cleared.   Iritis: Recurrent flares.  No recent evaluation with Dr. Cleotilde.  Patient was last seen in our office on 12/29/2022.  She has had a total of 2 doses of Cimzia  since then--most recent dose was administered on 04/01/2023.  She had to postpone her dose in December 2024 due to a healing abrasion on her right upper thigh as well as sinusitis.  She is planning to resume Cimzia  next week.  She has not yet noticed any improvement in the frequency of iritis flares but is willing to remain on Cimzia  as prescribed for now.  She was advised to notify us  if she continues to have recurrent flares or if she continues to have gaps in therapy.   Sjogren's syndrome with other organ involvement (HCC): Ro>8. La-: Evaluated by ENT--diagnosed with diffuse bilateral parotid sialolithiasis-CT 10/04/22-advanced chronic bilateral parotid gland sialolithiasis re-demonstrated.  No evidence of parotid inflammation or parotid duct enlargement.  Ro antibody >8 on 11/17/22.  La antibody negative.   Discussed the concern for Sjogren's syndrome at her last office visit.  Patient has mild eye dryness and nose dryness but has not been experiencing any mouth dryness.  Discussed the importance of close monitoring by ophthalmology as well as having dental visits at least every 6 months.  She was last seen by ENT on 12/31/2022--no sign of active infection-no masses or adenopathy were noted.  Stensen's ducts showed clear fluid at that time.   Other  neutropenia (HCC): White blood cell count was 3.8 and absolute neutrophils was 1,262 on 04/01/2023.  She will continue to require close lab monitoring while on Cimzia .  Primary osteoarthritis of both hands: Patient continues to experience intermittent discomfort involving both hands.  No synovitis was noted on examination today.  Primary osteoarthritis of both feet: She is not experiencing any increased discomfort in her feet at this time.  She has good range of motion of both ankle joints with no tenderness or joint swelling.  She is wearing proper fitting shoes.  DDD (degenerative disc disease), cervical: ROM improving with dry needling.   Arthropathy of lumbar facet joint: X-rays of the lumbar spine were unremarkable on 04/26/23. Intermittent sciatica-improving with PT.   Trochanteric bursitis, right hip: Not currently symptomatic.  Improved with PT.   Osteopenia of multiple sites: DEXA updated on 10/28/2020 AP spine BMD 0.921 with T-score -1.1. -4% change in BMD. Patient due to update DEXA.   Other medical conditions are listed as follows:  History of scoliosis  History of migraine  History of gastroesophageal reflux (GERD)  History of IBS  History of hypothyroidism  History of hypertension: Blood pressure was 146/74 today in the office.  History of bradycardia/ sinoatrial node dysfunction   Orders: Orders Placed This Encounter  Procedures   QuantiFERON-TB Gold Plus   No orders of the defined types were placed in this encounter.     Follow-Up Instructions: Return in about 3 months (around 08/18/2023) for Rheumatoid arthritis.   Waddell CHRISTELLA Craze, PA-C  Note - This record has been created using Dragon software.  Chart creation errors have been sought, but may not always  have been located. Such creation errors do not reflect on  the standard of medical care.

## 2023-05-21 ENCOUNTER — Ambulatory Visit: Payer: Managed Care, Other (non HMO) | Admitting: Internal Medicine

## 2023-05-21 ENCOUNTER — Encounter: Payer: Self-pay | Admitting: Internal Medicine

## 2023-05-21 VITALS — BP 126/82 | HR 58 | Temp 98.2°F | Resp 16 | Ht 68.5 in | Wt 135.0 lb

## 2023-05-21 DIAGNOSIS — I809 Phlebitis and thrombophlebitis of unspecified site: Secondary | ICD-10-CM

## 2023-05-21 DIAGNOSIS — J3089 Other allergic rhinitis: Secondary | ICD-10-CM | POA: Diagnosis not present

## 2023-05-21 NOTE — Patient Instructions (Addendum)
  For nasal congestion: -Use over-the-counter Flonase: 2 nasal sprays on each side of the nose in the morning until you feel better  -Use OTC Astepro 2 nasal sprays on each side of the nose twice daily until better    See you next month

## 2023-05-21 NOTE — Assessment & Plan Note (Signed)
 Phlebitis: The patient remains concerned about the small lump on the right arm, happened after she had blood drawn, compared to June 2024 the area seems smaller.  She already discussed the issue with rheumatology they are not overly concerned.  We agreed on observation for now. Allergies: Having nasal congestion for 4 weeks, likes to be sure she does not have sinusitis before her next rheumatology injection.  I suspect she has allergies, recommend Flonase  and/or Astepro . RTC as scheduled for next month

## 2023-05-21 NOTE — Progress Notes (Signed)
 Subjective:    Patient ID: Brittany Hodges, female    DOB: 1965-06-04, 58 y.o.   MRN: [id number]  DOS:  05/21/2023 Type of visit - description: Acute visit  Still has a lump at the right arm and is concerned about it. No pain, no discharge.  Also, having upper respiratory symptoms for a month, mostly nasal congestion and postnasal dripping. No fever or chills.  No chest congestion.  Mild cough on and off. Occasionally sneezing and itchy eyes.   Review of Systems See above   Past Medical History:  Diagnosis Date   Allergic rhinitis, cause unspecified    Anemia    Asthma    Atrial septal defect    s/p repair;   Echo 08/07/10: EF 55-60%; mild MR; atrial septal aneurysm; no residual ASD   Chest pain, unspecified    cardiac cath 07/31/10: normal coronaries; vigorous LVF   Esophageal reflux    Fibromyalgia    Headache(784.0)    Hematuria, unspecified    Hypertension    no meds    Hypothyroidism    Irritable bowel syndrome    Low back pain    Rheumatoid arthritis(714.0)    Dr Margarita   Scoliosis    Sinoatrial node dysfunction (HCC)    eval. for chronotropic competence completed in past   Sjogren's disease (HCC)     Past Surgical History:  Procedure Laterality Date   ASD REPAIR  1985   TONSILLECTOMY AND ADENOIDECTOMY     TUBAL LIGATION      Current Outpatient Medications  Medication Instructions   amLODipine  (NORVASC ) 5 mg, Oral, Daily   amoxicillin  (AMOXIL ) 500 MG capsule 2,000 capsules,  Once   BLACK CURRANT SEED OIL PO Daily   Carboxymethylcellulose Sodium (EYE DROPS OP) Daily   Cimzia  400 mg, Every 28 days   meclizine  (ANTIVERT ) 12.5 mg, Oral, 3 times daily PRN   mupirocin  ointment (BACTROBAN ) 2 % 1 Application, Topical, 2 times daily   prednisoLONE acetate (PRED FORTE) 1 % ophthalmic suspension 1 drop, Daily   Vitamin D  2,000 Units, Daily       Objective:   Physical Exam Skin:        BP 126/82   Pulse (!) 58   Temp 98.2 F (36.8 C) (Oral)    Resp 16   Ht 5' 8.5 (1.74 m)   Wt 135 lb (61.2 kg)   LMP 05/01/2016   SpO2 97%   BMI 20.23 kg/m  General:   Well developed, NAD, BMI noted. HEENT:  Normocephalic . Face symmetric, atraumatic. Nose with minimal congestion, sinuses non-TTP.  Throat symmetric and not red Lungs:  CTA B Normal respiratory effort, no intercostal retractions, no accessory muscle use. Heart: RRR,  no murmur.  Lower extremities: no pretibial edema bilaterally  Skin: Not pale. Not jaundice Neurologic:  alert & oriented X3.  Speech normal, gait appropriate for age and unassisted Psych--  Cognition and judgment appear intact.  Cooperative with normal attention span and concentration.  Behavior appropriate. No anxious or depressed appearing.      Assessment      Assessment   Prediabetes: A1c 6.0 (01-2017) HTN H/o Hypothyroidism (took meds at some point) GERD- dysphagia -Dr. Dorinda EGD from 04/02/2014 (Dr Waverly): Narrowing of proximal esophagus, question of extrinsic extrinsic compression, EUS showing no intrinsic esophageal mass but the spine was seen in close proximity  to the esophagus probably resulting in extrinsic compression. The endoscopist recommend repeat CT neck and chest in 3 months.saw ENT  04-2015 for dysphagia,had a scope,  rx PPI Asthma, uses alb rarely  CV: --Atrial septal defect: Status post repair, no residual ASD: Needs ABX prophylaxis preprocedures --Sinoatrial  Dysfunction, sees Dr Fernande   --CP: cath 2012, normal coronaries MSK: --Rheumatoid arthritis,   Dr. Margarita + rheumatoid factor +CCP,   h/o  low WBC with Methotrexate ,   injection site reaction to Enbrel  , self d/c Humira  ~ 12/2018 --Osteopenia: T score -1.7 (2020), -1.1 10/2020 IBS (Miralax prn constipation)   Admission: syncope 04-2016 (unlikely to be arrhythmia related per cards ) History of fibromyalgia   PLAN:  Phlebitis: The patient remains concerned about the small lump on the right arm, happened after she  had blood drawn, compared to June 2024 the area seems smaller.  She already discussed the issue with rheumatology they are not overly concerned.  We agreed on observation for now. Allergies: Having nasal congestion for 4 weeks, likes to be sure she does not have sinusitis before her next rheumatology injection.  I suspect she has allergies, recommend Flonase  and/or Astepro . RTC as scheduled for next month

## 2023-05-27 ENCOUNTER — Ambulatory Visit: Payer: Managed Care, Other (non HMO) | Attending: Rheumatology | Admitting: *Deleted

## 2023-05-27 ENCOUNTER — Encounter: Payer: Self-pay | Admitting: *Deleted

## 2023-05-27 ENCOUNTER — Ambulatory Visit: Payer: Managed Care, Other (non HMO) | Admitting: Physician Assistant

## 2023-05-27 VITALS — BP 133/84 | HR 48

## 2023-05-27 DIAGNOSIS — M0579 Rheumatoid arthritis with rheumatoid factor of multiple sites without organ or systems involvement: Secondary | ICD-10-CM | POA: Diagnosis not present

## 2023-05-27 MED ORDER — CERTOLIZUMAB PEGOL 2 X 200 MG ~~LOC~~ KIT
400.0000 mg | PACK | Freq: Once | SUBCUTANEOUS | Status: AC
  Administered 2023-05-27: 400 mg via SUBCUTANEOUS

## 2023-05-27 NOTE — Progress Notes (Signed)
 Subjective:   Patient presents to clinic today to receive monthly dose of Cimzia .  Patient running a fever or have signs/symptoms of infection? No  Patient currently on antibiotics for the treatment of infection? No  Patient have any upcoming invasive procedures/surgeries? No  Objective: CMP     Component Value Date/Time   NA 140 04/01/2023 1539   NA 144 04/08/2021 1216   K 4.1 04/01/2023 1539   CL 104 04/01/2023 1539   CO2 29 04/01/2023 1539   GLUCOSE 87 04/01/2023 1539   BUN 18 04/01/2023 1539   BUN 15 04/08/2021 1216   CREATININE 1.04 (H) 04/01/2023 1539   CALCIUM 10.2 04/01/2023 1539   PROT 7.8 04/01/2023 1539   PROT 7.9 04/08/2021 1216   ALBUMIN 4.0 10/04/2022 0703   ALBUMIN 4.5 04/08/2021 1216   AST 23 04/01/2023 1539   AST 21 02/18/2021 1452   ALT 15 04/01/2023 1539   ALT 13 02/18/2021 1452   ALKPHOS 54 10/04/2022 0703   BILITOT 0.4 04/01/2023 1539   BILITOT 0.3 04/08/2021 1216   BILITOT 0.4 02/18/2021 1452   GFRNONAA >60 10/04/2022 0703   GFRNONAA 53 (L) 02/18/2021 1452   GFRNONAA 77 02/15/2020 1023   GFRAA 89 02/15/2020 1023    CBC    Component Value Date/Time   WBC 3.8 04/01/2023 1539   RBC 4.35 04/01/2023 1539   HGB 12.1 04/01/2023 1539   HGB 12.3 04/08/2021 1216   HGB 11.2 (L) 07/26/2013 0802   HCT 38.7 04/01/2023 1539   HCT 39.3 04/08/2021 1216   HCT 35.5 07/26/2013 0802   PLT 160 04/01/2023 1539   PLT 140 (L) 04/08/2021 1216   MCV 89.0 04/01/2023 1539   MCV 90 04/08/2021 1216   MCV 86 07/26/2013 0802   MCH 27.8 04/01/2023 1539   MCHC 31.3 (L) 04/01/2023 1539   RDW 11.8 04/01/2023 1539   RDW 12.0 04/08/2021 1216   RDW 13.6 07/26/2013 0802   LYMPHSABS 1,395 02/22/2023 1546   LYMPHSABS 2.0 04/08/2021 1216   LYMPHSABS 0.9 07/26/2013 0802   MONOABS 1.4 (H) 10/04/2022 0703   EOSABS 49 04/01/2023 1539   EOSABS 0.0 04/08/2021 1216   EOSABS 0.0 07/26/2013 0802   BASOSABS 42 04/01/2023 1539   BASOSABS 0.0 04/08/2021 1216   BASOSABS 0.0  07/26/2013 0802    Baseline Immunosuppressant Therapy Labs TB GOLD    Latest Ref Rng & Units 07/15/2022    3:41 PM  Quantiferon TB Gold  Quantiferon TB Gold Plus NEGATIVE NEGATIVE    Hepatitis Panel    Latest Ref Rng & Units 06/23/2018    3:51 PM  Hepatitis  Hep B Surface Ag NON-REACTI NON-REACTIVE   Hep B IgM NON-REACTI NON-REACTIVE   Hep C Ab NON-REACTI NON-REACTIVE   Hep A IgM NON-REACTI NON-REACTIVE    HIV Lab Results  Component Value Date   HIV NONREACTIVE 12/21/2014   Immunoglobulins    Latest Ref Rng & Units 06/23/2018    3:51 PM  Immunoglobulin Electrophoresis  IgA  47 - 310 mg/dL 599   IgG 399 - 8,359 mg/dL 7,803   IgM 50 - 699 mg/dL 881    SPEP    Latest Ref Rng & Units 04/01/2023    3:39 PM  Serum Protein Electrophoresis  Total Protein 6.1 - 8.1 g/dL 7.8    H3EI No results found for: G6PDH TPMT No results found for: TPMT   Chest x-ray: 09/16/20-Stable exam without acute or active cardiopulmonary disease.   Assessment/Plan:   Administrations  This Visit     certolizumab pegol  (CIMZIA ) kit 400 mg     Admin Date 05/27/2023 Action Given Dose 400 mg Route Subcutaneous Documented By Cena Alfonso CROME, LPN             Patient tolerated injection well.   Appointment for next injection scheduled for 06/24/2023.  Patient due for labs in February 2025.  Patient is to call and reschedule appointment if running a fever with signs/symptoms of infection, on antibiotics for active infection or has an upcoming invasive procedure.  All questions encouraged and answered.  Instructed patient to call with any further questions or concerns.

## 2023-05-31 ENCOUNTER — Ambulatory Visit: Payer: Managed Care, Other (non HMO) | Admitting: Physician Assistant

## 2023-06-11 NOTE — Telephone Encounter (Addendum)
Received fax from [employer] for in-office Cimzia verification of benefits. Patient has [place] that is active.  [employer] is in-network. Patient has 20% office visit co-insurance. Pre-certification for [id number] is not required. Patient has 20% coinsurance for Cimzia (she is eligible for savings card). Patient has $2200 deductible and $6200 max OOP.  CPT codes 19147 and [phone] are vaid and billable with a 25% coinsurance  Case ID: [id number]  Chesley Mires, PharmD, MPH, BCPS, CPP Clinical Pharmacist (Rheumatology and Pulmonology)

## 2023-06-24 ENCOUNTER — Encounter: Payer: Self-pay | Admitting: *Deleted

## 2023-06-24 ENCOUNTER — Ambulatory Visit: Payer: Managed Care, Other (non HMO) | Attending: Rheumatology | Admitting: *Deleted

## 2023-06-24 VITALS — BP 142/76 | HR 51

## 2023-06-24 DIAGNOSIS — M0579 Rheumatoid arthritis with rheumatoid factor of multiple sites without organ or systems involvement: Secondary | ICD-10-CM

## 2023-06-24 DIAGNOSIS — Z111 Encounter for screening for respiratory tuberculosis: Secondary | ICD-10-CM

## 2023-06-24 DIAGNOSIS — Z13220 Encounter for screening for lipoid disorders: Secondary | ICD-10-CM | POA: Diagnosis not present

## 2023-06-24 DIAGNOSIS — Z79899 Other long term (current) drug therapy: Secondary | ICD-10-CM

## 2023-06-24 MED ORDER — CERTOLIZUMAB PEGOL 2 X 200 MG ~~LOC~~ KIT
400.0000 mg | PACK | Freq: Once | SUBCUTANEOUS | Status: AC
  Administered 2023-06-24: 400 mg via SUBCUTANEOUS

## 2023-06-24 NOTE — Progress Notes (Signed)
 Subjective:   Patient presents to clinic today to receive monthly dose of Cimzia .  Patient running a fever or have signs/symptoms of infection? No  Patient currently on antibiotics for the treatment of infection? No  Patient have any upcoming invasive procedures/surgeries? No  Objective: CMP     Component Value Date/Time   NA 140 04/01/2023 1539   NA 144 04/08/2021 1216   K 4.1 04/01/2023 1539   CL 104 04/01/2023 1539   CO2 29 04/01/2023 1539   GLUCOSE 87 04/01/2023 1539   BUN 18 04/01/2023 1539   BUN 15 04/08/2021 1216   CREATININE 1.04 (H) 04/01/2023 1539   CALCIUM 10.2 04/01/2023 1539   PROT 7.8 04/01/2023 1539   PROT 7.9 04/08/2021 1216   ALBUMIN 4.0 10/04/2022 0703   ALBUMIN 4.5 04/08/2021 1216   AST 23 04/01/2023 1539   AST 21 02/18/2021 1452   ALT 15 04/01/2023 1539   ALT 13 02/18/2021 1452   ALKPHOS 54 10/04/2022 0703   BILITOT 0.4 04/01/2023 1539   BILITOT 0.3 04/08/2021 1216   BILITOT 0.4 02/18/2021 1452   GFRNONAA >60 10/04/2022 0703   GFRNONAA 53 (L) 02/18/2021 1452   GFRNONAA 77 02/15/2020 1023   GFRAA 89 02/15/2020 1023    CBC    Component Value Date/Time   WBC 3.8 04/01/2023 1539   RBC 4.35 04/01/2023 1539   HGB 12.1 04/01/2023 1539   HGB 12.3 04/08/2021 1216   HGB 11.2 (L) 07/26/2013 0802   HCT 38.7 04/01/2023 1539   HCT 39.3 04/08/2021 1216   HCT 35.5 07/26/2013 0802   PLT 160 04/01/2023 1539   PLT 140 (L) 04/08/2021 1216   MCV 89.0 04/01/2023 1539   MCV 90 04/08/2021 1216   MCV 86 07/26/2013 0802   MCH 27.8 04/01/2023 1539   MCHC 31.3 (L) 04/01/2023 1539   RDW 11.8 04/01/2023 1539   RDW 12.0 04/08/2021 1216   RDW 13.6 07/26/2013 0802   LYMPHSABS 1,395 02/22/2023 1546   LYMPHSABS 2.0 04/08/2021 1216   LYMPHSABS 0.9 07/26/2013 0802   MONOABS 1.4 (H) 10/04/2022 0703   EOSABS 49 04/01/2023 1539   EOSABS 0.0 04/08/2021 1216   EOSABS 0.0 07/26/2013 0802   BASOSABS 42 04/01/2023 1539   BASOSABS 0.0 04/08/2021 1216   BASOSABS 0.0  07/26/2013 0802    Baseline Immunosuppressant Therapy Labs TB GOLD    Latest Ref Rng & Units 07/15/2022    3:41 PM  Quantiferon TB Gold  Quantiferon TB Gold Plus NEGATIVE NEGATIVE    Hepatitis Panel    Latest Ref Rng & Units 06/23/2018    3:51 PM  Hepatitis  Hep B Surface Ag NON-REACTI NON-REACTIVE   Hep B IgM NON-REACTI NON-REACTIVE   Hep C Ab NON-REACTI NON-REACTIVE   Hep A IgM NON-REACTI NON-REACTIVE    HIV Lab Results  Component Value Date   HIV NONREACTIVE 12/21/2014   Immunoglobulins    Latest Ref Rng & Units 06/23/2018    3:51 PM  Immunoglobulin Electrophoresis  IgA  47 - 310 mg/dL 599   IgG 399 - 8,359 mg/dL 7,803   IgM 50 - 699 mg/dL 881    SPEP    Latest Ref Rng & Units 04/01/2023    3:39 PM  Serum Protein Electrophoresis  Total Protein 6.1 - 8.1 g/dL 7.8    H3EI No results found for: G6PDH TPMT No results found for: TPMT   Chest x-ray:  09/16/20-Stable exam without acute or active cardiopulmonary disease.   Assessment/Plan:  Administrations This Visit     certolizumab pegol  (CIMZIA ) kit 400 mg     Admin Date 06/24/2023 Action Given Dose 400 mg Route Subcutaneous Documented By Cena Alfonso CROME, LPN             Patient tolerated injection well.   Appointment for next injection scheduled for 07/22/2023.  Patient due for labs today and were drawn in office.  Patient is to call and reschedule appointment if running a fever with signs/symptoms of infection, on antibiotics for active infection or has an upcoming invasive procedure.  All questions encouraged and answered.  Instructed patient to call with any further questions or concerns.

## 2023-06-25 NOTE — Progress Notes (Signed)
 CMP is normal.  CBC showed mildly decreased platelets.  Will continue to monitor.  Lipid panel is normal.  Please forward results to her PCP.

## 2023-06-26 LAB — CBC WITH DIFFERENTIAL/PLATELET
Absolute Lymphocytes: 1694 {cells}/uL (ref 850–3900)
Absolute Monocytes: 636 {cells}/uL (ref 200–950)
Basophils Absolute: 22 {cells}/uL (ref 0–200)
Basophils Relative: 0.5 %
Eosinophils Absolute: 0 {cells}/uL — ABNORMAL LOW (ref 15–500)
Eosinophils Relative: 0 %
HCT: 39 % (ref 35.0–45.0)
Hemoglobin: 12.2 g/dL (ref 11.7–15.5)
MCH: 27.8 pg (ref 27.0–33.0)
MCHC: 31.3 g/dL — ABNORMAL LOW (ref 32.0–36.0)
MCV: 88.8 fL (ref 80.0–100.0)
MPV: 12.5 fL (ref 7.5–12.5)
Monocytes Relative: 14.8 %
Neutro Abs: 1948 {cells}/uL (ref 1500–7800)
Neutrophils Relative %: 45.3 %
Platelets: 134 10*3/uL — ABNORMAL LOW (ref 140–400)
RBC: 4.39 10*6/uL (ref 3.80–5.10)
RDW: 11.8 % (ref 11.0–15.0)
Total Lymphocyte: 39.4 %
WBC: 4.3 10*3/uL (ref 3.8–10.8)

## 2023-06-26 LAB — COMPLETE METABOLIC PANEL WITH GFR
AG Ratio: 1.1 (calc) (ref 1.0–2.5)
ALT: 29 U/L (ref 6–29)
AST: 32 U/L (ref 10–35)
Albumin: 4 g/dL (ref 3.6–5.1)
Alkaline phosphatase (APISO): 73 U/L (ref 37–153)
BUN: 13 mg/dL (ref 7–25)
CO2: 27 mmol/L (ref 20–32)
Calcium: 9.9 mg/dL (ref 8.6–10.4)
Chloride: 104 mmol/L (ref 98–110)
Creat: 0.93 mg/dL (ref 0.50–1.03)
Globulin: 3.8 g/dL — ABNORMAL HIGH (ref 1.9–3.7)
Glucose, Bld: 85 mg/dL (ref 65–99)
Potassium: 3.8 mmol/L (ref 3.5–5.3)
Sodium: 138 mmol/L (ref 135–146)
Total Bilirubin: 0.4 mg/dL (ref 0.2–1.2)
Total Protein: 7.8 g/dL (ref 6.1–8.1)
eGFR: 71 mL/min/{1.73_m2} (ref >=60)

## 2023-06-26 LAB — LIPID PANEL
Cholesterol: 140 mg/dL (ref <200)
HDL: 70 mg/dL (ref >=50)
LDL Cholesterol (Calc): 55 mg/dL
Non-HDL Cholesterol (Calc): 70 mg/dL (ref <130)
Total CHOL/HDL Ratio: 2 (calc) (ref <5.0)
Triglycerides: 74 mg/dL (ref <150)

## 2023-06-26 LAB — QUANTIFERON-TB GOLD PLUS
Mitogen-NIL: >10 [IU]/mL
NIL: 0.04 [IU]/mL
QuantiFERON-TB Gold Plus: NEGATIVE
TB1-NIL: 0 [IU]/mL
TB2-NIL: 0 [IU]/mL

## 2023-06-28 NOTE — Progress Notes (Signed)
 TB gold negative

## 2023-07-09 ENCOUNTER — Encounter: Payer: Self-pay | Admitting: Internal Medicine

## 2023-07-09 ENCOUNTER — Ambulatory Visit (INDEPENDENT_AMBULATORY_CARE_PROVIDER_SITE_OTHER): Payer: Managed Care, Other (non HMO) | Admitting: Internal Medicine

## 2023-07-09 VITALS — BP 120/70 | HR 66 | Temp 98.2°F | Resp 16 | Ht 68.5 in | Wt 142.4 lb

## 2023-07-09 DIAGNOSIS — N644 Mastodynia: Secondary | ICD-10-CM

## 2023-07-09 DIAGNOSIS — E559 Vitamin D deficiency, unspecified: Secondary | ICD-10-CM | POA: Diagnosis not present

## 2023-07-09 DIAGNOSIS — R739 Hyperglycemia, unspecified: Secondary | ICD-10-CM | POA: Diagnosis not present

## 2023-07-09 DIAGNOSIS — Z0000 Encounter for general adult medical examination without abnormal findings: Secondary | ICD-10-CM

## 2023-07-09 DIAGNOSIS — E569 Vitamin deficiency, unspecified: Secondary | ICD-10-CM | POA: Diagnosis not present

## 2023-07-09 DIAGNOSIS — E039 Hypothyroidism, unspecified: Secondary | ICD-10-CM | POA: Diagnosis not present

## 2023-07-09 DIAGNOSIS — R9230 Dense breasts, unspecified: Secondary | ICD-10-CM

## 2023-07-09 LAB — TSH: TSH: 4.26 u[IU]/mL (ref 0.35–5.50)

## 2023-07-09 LAB — VITAMIN D 25 HYDROXY (VIT D DEFICIENCY, FRACTURES): VITD: 27.19 ng/mL — ABNORMAL LOW (ref 30.00–100.00)

## 2023-07-09 LAB — MAGNESIUM: Magnesium: 1.9 mg/dL (ref 1.5–2.5)

## 2023-07-09 LAB — HEMOGLOBIN A1C: Hgb A1c MFr Bld: 6 % (ref 4.6–6.5)

## 2023-07-09 LAB — T4, FREE: Free T4: 0.74 ng/dL (ref 0.60–1.60)

## 2023-07-09 NOTE — Patient Instructions (Addendum)
Vaccines I recommend: Prevnar 20 Shingrix (shingles) COVID-vaccine  Check the  blood pressure regularly Blood pressure goal:  between 110/65 and  135/85. If it is consistently higher or lower, let me know     GO TO THE LAB : Get the blood work     Next visit with me in 6 months  Please schedule it at the front desk

## 2023-07-09 NOTE — Progress Notes (Signed)
 Subjective:    Patient ID: Brittany Hodges, female    DOB: [birth year], 58 y.o.   MRN: [id number]  DOS:  07/09/2023 Type of visit - description: CPX  Here for CPX.  Feels well. Lately she has noticed some discomfort at the right breast. Denies any redness, discharge or lumps on self-exam. No nipple discharge or bleeding  Review of Systems  Other than above, a 14 point review of systems is negative      Past Medical History:  Diagnosis Date   Allergic rhinitis, cause unspecified    Anemia    Asthma    Atrial septal defect    s/p repair;   Echo 08/07/10: EF 55-60%; mild MR; atrial septal aneurysm; no residual ASD   Chest pain, unspecified    cardiac cath 07/31/10: normal coronaries; vigorous LVF   Esophageal reflux    Fibromyalgia    Headache(784.0)    Hematuria, unspecified    Hypertension    no meds    Hypothyroidism    Irritable bowel syndrome    Low back pain    Rheumatoid arthritis(714.0)    Dr Victory Dakin   Scoliosis    Sinoatrial node dysfunction (HCC)    eval. for chronotropic competence completed in past   Sjogren's disease (HCC)     Past Surgical History:  Procedure Laterality Date   ASD REPAIR  1985   TONSILLECTOMY AND ADENOIDECTOMY     TUBAL LIGATION     Social History   Socioeconomic History   Marital status: Single    Spouse name: Not on file   Number of children: 3   Years of education: Not on file   Highest education level: Not on file  Occupational History   Occupation: [employer] access  Tobacco Use   Smoking status: Never    Passive exposure: Never   Smokeless tobacco: Never   Tobacco comments:    never used tobacco  Vaping Use   Vaping status: Never Used  Substance and Sexual Activity   Alcohol use: No   Drug use: No   Sexual activity: Not Currently    Birth control/protection: Surgical  Other Topics Concern   Not on file  Social History Narrative   Household-- lives by herself   Social Drivers of Research scientist (physical sciences) Strain: Not on file  Food Insecurity: Not on file  Transportation Needs: Not on file  Physical Activity: Not on file  Stress: Not on file  Social Connections: Unknown (09/30/2021)   Received from [facility]   Social Network    Social Network: Not on file  Intimate Partner Violence: Unknown (08/22/2021)   Received from [facility], [facility]   HITS    Physically Hurt: Not on file    Insult or Talk Down To: Not on file    Threaten Physical Harm: Not on file    Scream or Curse: Not on file    Current Outpatient Medications  Medication Instructions   amLODipine (NORVASC) 5 mg, Oral, Daily   amoxicillin (AMOXIL) 500 MG capsule 2,000 capsules,  Once   BLACK CURRANT SEED OIL PO Daily   Carboxymethylcellulose Sodium (EYE DROPS OP) Daily   Cimzia 400 mg, Every 28 days   meclizine (ANTIVERT) 12.5 mg, Oral, 3 times daily PRN   prednisoLONE acetate (PRED FORTE) 1 % ophthalmic suspension 1 drop, Daily   Vitamin D 2,000 Units, Daily       Objective:   Physical Exam BP 120/70  Pulse 66   Temp 98.2 F (36.8 C) (Oral)   Resp 16   Ht 5' 8.5" (1.74 m)   Wt 142 lb 6 oz (64.6 kg)   LMP 05/01/2016   SpO2 97%   BMI 21.33 kg/m  General: Well developed, NAD, BMI noted Neck: No  thyromegaly  HEENT:  Normocephalic . Face symmetric, atraumatic Breast exam: Skin and nipples normal. Axillary areas: No lymphadenopathy. Breast tissue is very dense, specifically at the lateral side of the right breast which she complained of pain, there is no mass. Lungs:  CTA B Normal respiratory effort, no intercostal retractions, no accessory muscle use. Heart: RRR,  no murmur.  Abdomen:  Not distended, soft, non-tender. No rebound or rigidity.   Lower extremities: no pretibial edema bilaterally  Skin: Exposed areas without rash. Not pale. Not jaundice Neurologic:  alert & oriented X3.  Speech normal, gait appropriate for age and unassisted Strength symmetric and  appropriate for age.  Psych: Cognition and judgment appear intact.  Cooperative with normal attention span and concentration.  Behavior appropriate. No anxious or depressed appearing.     Assessment       Assessment   Prediabetes: A1c 6.0 (01-2017) HTN H/o Hypothyroidism (took meds at some point) GERD- dysphagia -Dr. Baldwin [place] EGD from 04/02/2014 (Dr Boyd Kerbs): Narrowing of proximal esophagus, question of extrinsic extrinsic compression, EUS showing no intrinsic esophageal mass but the spine was seen in close proximity  to the esophagus probably resulting in extrinsic compression. The endoscopist recommend repeat CT neck and chest in 3 months.saw ENT 04-2015 for dysphagia,had a scope,  rx PPI Asthma, uses alb rarely  CV: --Atrial septal defect: Status post repair, no residual ASD: Needs ABX prophylaxis preprocedures --Sinoatrial  Dysfunction, sees Dr Graciela Husbands   --CP: cath 2012, normal coronaries MSK: --Rheumatoid arthritis,   Dr. Victory Dakin + rheumatoid factor +CCP,   h/o  low WBC with Methotrexate,   injection site reaction to Enbrel  , self d/c Humira ~ 12/2018 --Osteopenia: T score -1.7 (2020), -1.1 10/2020 IBS (Miralax prn constipation)   syncope 04-2016  History of fibromyalgia   PLAN:  Here for CPX - Tdap 2022  - Pneumovax   2016;  prevnar 01-2016.    - Had a flu shot. - Recommend: COVID-vaccine, Shingrix and PNM 20. -Female care:   per  Gynecology, PAP 09/2021. Complaining of right mastalgia, breast exam is limited by dense breast tissue.  Last MMG 12/2022.  We discussed diagnostic ultrasound versus observation, elected [place].  Will arrange -CCS: Cscope  05/07/2011 negative,Cscope 09/2019 , 10 years per report  - Diet and exercise: Doing well, going to the gym. - Labs Labs from 06/24/2023: CMP FLP WNL, CBC show platelets of 134 --- slightly low.   Will get a A1c, Vit d , TSH, free T4 We also discussed the following:  HTN: On amlodipine, doing great with lifestyle, stop BP meds?, she  will let me know if BP is consistently 110 or less  to consider adjust meds. Prediabetes: Check A1c. Subclinical hypothyroidism.  Check TFTs Rheumatoid arthritis: On biologicals, recommended PNM 20, declined   RTC 6 months.

## 2023-07-10 ENCOUNTER — Other Ambulatory Visit: Payer: Self-pay | Admitting: Internal Medicine

## 2023-07-11 ENCOUNTER — Encounter: Payer: Self-pay | Admitting: Internal Medicine

## 2023-07-11 NOTE — Assessment & Plan Note (Signed)
 Here for CPX - Tdap 2022  - Pneumovax   2016;  prevnar 01-2016.    - Had a flu shot. - Recommend: COVID-vaccine, Shingrix and PNM 20. -Female care:   per  Gynecology, PAP 09/2021. Complaining of right mastalgia, breast exam is limited by dense breast tissue.  Last MMG 12/2022.  We discussed diagnostic ultrasound versus observation, elected [place].  Will arrange -CCS: Cscope  05/07/2011 negative,Cscope 09/2019 , 10 years per report  - Diet and exercise: Doing well, going to the gym. - Labs Labs from 06/24/2023: CMP FLP WNL, CBC show platelets of 134 --- slightly low.   Will get a A1c, Vit d , TSH, free T4

## 2023-07-11 NOTE — Assessment & Plan Note (Signed)
 Here for CPX We also discussed the following:  HTN: On amlodipine, doing great with lifestyle, stop BP meds?, she will let me know if BP is consistently 110 or less  to consider adjust meds. Prediabetes: Check A1c. Subclinical hypothyroidism.  Check TFTs Rheumatoid arthritis: On biologicals, recommended PNM 20, declined   RTC 6 months.

## 2023-07-13 ENCOUNTER — Other Ambulatory Visit: Payer: Self-pay | Admitting: Internal Medicine

## 2023-07-13 ENCOUNTER — Encounter: Payer: Self-pay | Admitting: Internal Medicine

## 2023-07-13 MED ORDER — ERGOCALCIFEROL 1.25 MG (50000 UT) PO CAPS: 50000.0000 [IU] | ORAL_CAPSULE | ORAL | 0 refills | Status: DC

## 2023-07-14 ENCOUNTER — Telehealth: Payer: Self-pay | Admitting: *Deleted

## 2023-07-14 LAB — ZINC: Zinc: 60 ug/dL (ref 60–130)

## 2023-07-14 NOTE — Telephone Encounter (Signed)
 Patient states she is not sure if she will need a biopsy. At this time she is only scheduled for an ultrasound. Patient advised if she has no signs of infections she can proceed with the Cimzia. Patient expressed understanding.

## 2023-07-14 NOTE — Telephone Encounter (Signed)
 Patient contacted the office stating she recently saw her PCP for tenderness under her breast. Patient states the PCP advised her that she had dense breast and is sending her for an ultrasound. Patient would like to know if she should postpone her Cimzia or come in as scheduled. Please advise.

## 2023-07-14 NOTE — Telephone Encounter (Signed)
 Will she be undergoing a biopsy?   If there is no symptoms of an infection she can proceed with cimzia.

## 2023-07-15 ENCOUNTER — Encounter: Payer: Self-pay | Admitting: Internal Medicine

## 2023-07-22 ENCOUNTER — Encounter: Payer: Self-pay | Admitting: *Deleted

## 2023-07-22 ENCOUNTER — Ambulatory Visit: Payer: Managed Care, Other (non HMO) | Attending: Rheumatology | Admitting: *Deleted

## 2023-07-22 VITALS — BP 144/81 | HR 69

## 2023-07-22 DIAGNOSIS — M0579 Rheumatoid arthritis with rheumatoid factor of multiple sites without organ or systems involvement: Secondary | ICD-10-CM | POA: Diagnosis not present

## 2023-07-22 MED ORDER — CERTOLIZUMAB PEGOL 2 X 200 MG ~~LOC~~ KIT
400.0000 mg | PACK | Freq: Once | SUBCUTANEOUS | Status: AC
Start: 2023-07-22 — End: 2023-07-22
  Administered 2023-07-22: 400 mg via SUBCUTANEOUS

## 2023-07-22 NOTE — Progress Notes (Signed)
 Subjective:   Patient presents to clinic today to receive monthly dose of Cimzia.  Patient running a fever or have signs/symptoms of infection? No  Patient currently on antibiotics for the treatment of infection? No  Patient have any upcoming invasive procedures/surgeries? No  Objective: CMP     Component Value Date/Time   NA 138 06/24/2023 1518   NA 144 04/08/2021 1216   K 3.8 06/24/2023 1518   CL 104 06/24/2023 1518   CO2 27 06/24/2023 1518   GLUCOSE 85 06/24/2023 1518   BUN 13 06/24/2023 1518   BUN 15 04/08/2021 1216   CREATININE 0.93 06/24/2023 1518   CALCIUM 9.9 06/24/2023 1518   PROT 7.8 06/24/2023 1518   PROT 7.9 04/08/2021 1216   ALBUMIN 4.0 10/04/2022 0703   ALBUMIN 4.5 04/08/2021 1216   AST 32 06/24/2023 1518   AST 21 02/18/2021 1452   ALT 29 06/24/2023 1518   ALT 13 02/18/2021 1452   ALKPHOS 54 10/04/2022 0703   BILITOT 0.4 06/24/2023 1518   BILITOT 0.3 04/08/2021 1216   BILITOT 0.4 02/18/2021 1452   GFRNONAA >60 10/04/2022 0703   GFRNONAA 53 (L) 02/18/2021 1452   GFRNONAA 77 02/15/2020 1023   GFRAA 89 02/15/2020 1023    CBC    Component Value Date/Time   WBC 4.3 06/24/2023 1518   RBC 4.39 06/24/2023 1518   HGB 12.2 06/24/2023 1518   HGB 12.3 04/08/2021 1216   HGB 11.2 (L) 07/26/2013 0802   HCT 39.0 06/24/2023 1518   HCT 39.3 04/08/2021 1216   HCT 35.5 07/26/2013 0802   PLT 134 (L) 06/24/2023 1518   PLT 140 (L) 04/08/2021 1216   MCV 88.8 06/24/2023 1518   MCV 90 04/08/2021 1216   MCV 86 07/26/2013 0802   MCH 27.8 06/24/2023 1518   MCHC 31.3 (L) 06/24/2023 1518   RDW 11.8 06/24/2023 1518   RDW 12.0 04/08/2021 1216   RDW 13.6 07/26/2013 0802   LYMPHSABS 1,395 02/22/2023 1546   LYMPHSABS 2.0 04/08/2021 1216   LYMPHSABS 0.9 07/26/2013 0802   MONOABS 1.4 (H) 10/04/2022 0703   EOSABS 0 (L) 06/24/2023 1518   EOSABS 0.0 04/08/2021 1216   EOSABS 0.0 07/26/2013 0802   BASOSABS 22 06/24/2023 1518   BASOSABS 0.0 04/08/2021 1216   BASOSABS 0.0  07/26/2013 0802    Baseline Immunosuppressant Therapy Labs TB GOLD    Latest Ref Rng & Units 06/24/2023    3:18 PM  Quantiferon TB Gold  Quantiferon TB Gold Plus NEGATIVE NEGATIVE    Hepatitis Panel    Latest Ref Rng & Units 06/23/2018    3:51 PM  Hepatitis  Hep B Surface Ag NON-REACTI NON-REACTIVE   Hep B IgM NON-REACTI NON-REACTIVE   Hep C Ab NON-REACTI NON-REACTIVE   Hep A IgM NON-REACTI NON-REACTIVE    HIV Lab Results  Component Value Date   HIV NONREACTIVE 12/21/2014   Immunoglobulins    Latest Ref Rng & Units 06/23/2018    3:51 PM  Immunoglobulin Electrophoresis  IgA  47 - 310 mg/dL 161   IgG 096 - 0,454 mg/dL 0,981   IgM 50 - 191 mg/dL 478    SPEP    Latest Ref Rng & Units 06/24/2023    3:18 PM  Serum Protein Electrophoresis  Total Protein 6.1 - 8.1 g/dL 7.8    G9FA No results found for: "G6PDH" TPMT No results found for: "TPMT"   Chest x-ray: 09/16/20-Stable exam without acute or active cardiopulmonary disease.   Assessment/Plan:  Administrations This Visit     certolizumab pegol (CIMZIA) kit 400 mg     Admin Date 07/22/2023 Action Given Dose 400 mg Route Subcutaneous Documented By Henriette Combs, LPN             Patient tolerated injection well.   Appointment for next injection scheduled for 08/19/2023.  Patient due for labs in May 2025.  Patient is to call and reschedule appointment if running a fever with signs/symptoms of infection, on antibiotics for active infection or has an upcoming invasive procedure.  All questions encouraged and answered.  Instructed patient to call with any further questions or concerns.

## 2023-08-05 NOTE — Progress Notes (Unsigned)
 Office Visit Note  Patient: Brittany Hodges             Date of Birth: Sep 16, 1965           MRN: [id number]             PCP: Brittany Plump, MD Referring: Brittany Plump, MD Visit Date: 08/19/2023 Occupation: @GUAROCC @  Subjective:  Medication monitoring   History of Present Illness: Brittany Hodges is a 58 y.o. female with history of seropositive rheumatoid arthritis and sjogren's syndrome.  Patient reinitiated Cimzia starting on 05/27/2023.  Her second dose was administered on 06/24/2023 and the third dose was administered on 07/22/2023.  She presents today for the fourth dose of Cimzia.  Patient states that she seems to be having less frequent iritis flares but continues to have chronic dry eyes.  She has been using refresh eyedrops for symptomatic relief.  She has not needed to use prednisone eyedrops as frequent.  Patient continues to have some ongoing discomfort in the left knee.  She has been using an left knee joint brace as support.  She denies any recurrence of pain in the right knee.  She denies any joint swelling at this time.  Patient states that she recently had a flare of TMJ and is currently taking a Medrol Dosepak.  Activities of Daily Living:  Patient reports morning stiffness for 0 minute.   Patient Denies nocturnal pain.  Difficulty dressing/grooming: Denies Difficulty climbing stairs: Denies Difficulty getting out of chair: Denies Difficulty using hands for taps, buttons, cutlery, and/or writing: Denies  Review of Systems  Constitutional:  Negative for fatigue.  HENT:  Negative for mouth sores and mouth dryness.   Eyes:  Negative for dryness.  Respiratory:  Negative for shortness of breath.   Cardiovascular:  Negative for chest pain and palpitations.  Gastrointestinal:  Negative for blood in stool, constipation and diarrhea.  Endocrine: Negative for increased urination.  Genitourinary:  Negative for involuntary urination.  Musculoskeletal:  Positive for muscle  tenderness. Negative for joint pain, gait problem, joint pain, joint swelling, myalgias, muscle weakness, morning stiffness and myalgias.  Skin:  Negative for color change, rash, hair loss and sensitivity to sunlight.  Allergic/Immunologic: Negative for susceptible to infections.  Neurological:  Negative for dizziness and headaches.  Hematological:  Positive for swollen glands.  Psychiatric/Behavioral:  Negative for depressed mood and sleep disturbance. The patient is nervous/anxious.     PMFS History:  Patient Active Problem List   Diagnosis Date Noted   Osteopenia 01/27/2022   Bradycardia 03/06/2021   Benign paroxysmal positional vertigo 03/06/2021   Mild intermittent asthma 12/22/2020   Chronic sinusitis 09/14/2019   Intolerance, food 09/14/2019   Asthmatic bronchitis 04/06/2016   Laryngopharyngeal reflux (LPR) 04/06/2016   PCP NOTES >>>>> 03/09/2015   Annual physical exam 12/21/2014   Cardiomegaly, by MRI 03/16/2014   Leukopenia 03/10/2014   Dizziness 02/10/2011   SINOATRIAL NODE DYSFUNCTION 08/06/2008   ATRIAL SEPTAL DEFECT 06/29/2007   Hypothyroidism 03/21/2007   Essential hypertension 03/21/2007   Perennial allergic rhinitis 03/21/2007   GERD-- dysphagia 03/21/2007   CONSTIPATION 03/21/2007   IRRITABLE BOWEL SYNDROME 03/21/2007   RA (rheumatoid arthritis) (HCC) 03/21/2007   LOW BACK PAIN 03/21/2007   FIBROMYALGIA 03/21/2007    Past Medical History:  Diagnosis Date   Allergic rhinitis, cause unspecified    Anemia    Asthma    Atrial septal defect    s/p repair;   Echo 08/07/10: EF 55-60%; mild MR;  atrial septal aneurysm; no residual ASD   Chest pain, unspecified    cardiac cath 07/31/10: normal coronaries; vigorous LVF   Esophageal reflux    Fibromyalgia    Headache(784.0)    Hematuria, unspecified    Hypertension    no meds    Hypothyroidism    Irritable bowel syndrome    Low back pain    Rheumatoid arthritis(714.0)    Dr Victory Dakin   Scoliosis     Sinoatrial node dysfunction (HCC)    eval. for chronotropic competence completed in past   Sjogren's disease (HCC)     Family History  Problem Relation Age of Onset   Hypertension Mother    Diabetes Mother    Kidney failure Mother    Glaucoma Father    Hypertension Sister    Thyroid disease Sister    Hypertension Sister    Thyroid disease Sister    Heart disease Maternal Grandmother    Stomach cancer Maternal Grandmother 95   Rheum arthritis Maternal Grandmother    Sinusitis Daughter    Asthma Son    Eczema Son    Breast cancer Other        aunt   Diabetes Other        GM   Colon cancer Neg Hx    Allergic rhinitis Neg Hx    Angioedema Neg Hx    Immunodeficiency Neg Hx    Past Surgical History:  Procedure Laterality Date   ASD REPAIR  1985   TONSILLECTOMY AND ADENOIDECTOMY     TUBAL LIGATION     Social History   Social History Narrative   Household-- lives by herself   Immunization History  Administered Date(s) Administered   Dtap, Unspecified 11/05/1969, 01/08/1970, 07/01/1978   Influenza Split 03/17/2011, 03/17/2012, 02/16/2023   Influenza Whole 04/05/2009, 02/21/2010   Influenza, Seasonal, Injecte, Preservative Fre 03/11/2021   Influenza,inj,Quad PF,6+ Mos 03/21/2013, 03/09/2014, 05/24/2015, 01/27/2016   Influenza-Unspecified 07/30/2020, 02/17/2022   Measles 10/20/1976   PFIZER(Purple Top)SARS-COV-2 Vaccination 12/16/2019, 01/16/2020   Pneumococcal Conjugate-13 01/27/2016   Pneumococcal Polysaccharide-23 03/08/2015   Polio, Unspecified 12/08/1969, 07/01/1978, 08/09/1978   Rubella 10/20/1976   Smallpox 01/08/1970   Td 07/23/2010   Tdap 05/28/2020, 02/25/2022     Objective: Vital Signs: BP (!) 153/78 (BP Location: Left Arm, Patient Position: Sitting, Cuff Size: Normal)   Pulse (!) 46   Resp 14   Ht 5' 8.5" (1.74 m)   Wt 143 lb (64.9 kg)   LMP 05/01/2016   BMI 21.43 kg/m    Physical Exam Vitals and nursing note reviewed.  Constitutional:       Appearance: She is well-developed.  HENT:     Head: Normocephalic and atraumatic.  Eyes:     Conjunctiva/sclera: Conjunctivae normal.  Cardiovascular:     Rate and Rhythm: Normal rate and regular rhythm.     Heart sounds: Normal heart sounds.  Pulmonary:     Effort: Pulmonary effort is normal.     Breath sounds: Normal breath sounds.  Abdominal:     General: Bowel sounds are normal.     Palpations: Abdomen is soft.  Musculoskeletal:     Cervical back: Normal range of motion.  Lymphadenopathy:     Cervical: No cervical adenopathy.  Skin:    General: Skin is warm and dry.     Capillary Refill: Capillary refill takes less than 2 seconds.  Neurological:     Mental Status: She is alert and oriented to person, place, and time.  Psychiatric:  Behavior: Behavior normal.      Musculoskeletal Exam: C-spine, thoracic spine, and lumbar spine good ROM. Shoulder joints, elbow joints, wrist joints, MCPs, PIPs, and DIPs good ROM with no synovitis.  Complete fist formation bilaterally.  Hip joints have good ROM with no groin pain.  Left knee in a brace--no effusion noted.  Right knee joint has good ROM with no warmth or effusion.  Ankle joints have good ROM with no tenderness or joint swelling.   CDAI Exam: CDAI Score: -- Patient Global: --; Provider Global: -- Swollen: --; Tender: -- Joint Exam 08/19/2023   No joint exam has been documented for this visit   There is currently no information documented on the homunculus. Go to the Rheumatology activity and complete the homunculus joint exam.  Investigation: No additional findings.  Imaging: No results found.  Recent Labs: Lab Results  Component Value Date   WBC 4.3 06/24/2023   HGB 12.2 06/24/2023   PLT 134 (L) 06/24/2023   NA 138 06/24/2023   K 3.8 06/24/2023   CL 104 06/24/2023   CO2 27 06/24/2023   GLUCOSE 85 06/24/2023   BUN 13 06/24/2023   CREATININE 0.93 06/24/2023   BILITOT 0.4 06/24/2023   ALKPHOS 54  10/04/2022   AST 32 06/24/2023   ALT 29 06/24/2023   PROT 7.8 06/24/2023   ALBUMIN 4.0 10/04/2022   CALCIUM 9.9 06/24/2023   GFRAA 89 02/15/2020   QFTBGOLDPLUS NEGATIVE 06/24/2023    Speciality Comments: Dexa- 05/27/18 Osteopenia T-Score -1.7 BMD 0.963  Prior therapy includes: Plaquenil (inadequate response) , Enbrel (injection site reaction) and methotrexate (neutropenia so on low dose).  Procedures:  No procedures performed Allergies: Etanercept, Methotrexate derivatives, and Pregabalin    Assessment / Plan:     Visit Diagnoses: Rheumatoid arthritis involving multiple sites with positive rheumatoid factor (HCC): She has no synovitis on examination today.  Patient reinitiated Cimzia on 05/27/2023 which she has been tolerating without any side effects.  Her second dose of Cimzia was administered on 06/24/2023 and the third dose was administered on 07/22/2023.  She is due for her fourth dose of Cimzia today.  Patient seems to be having less frequent and less severe flares of iritis but continues to have chronic eye dryness.  She has been using refresh eyedrops for symptomatic relief and has not needed to use prednisone eyedrops as frequently.  Patient recently had a flare of TMJ and is currently taking a medrol dosepak.  Patient plans on proceeding with her fourth dose of Cimzia today. She will follow up in 3 months or sooner if needed.  A work note was provided to the patient as requested.   High risk medication use - Cimzia 400 mg sq injections once monthly-in-office administered.  A dose of Cimzia was administered today in the office.   Cimzia was restarted on 05/27/23.   CBC and CMP were drawn on 08/12/2023.  She will continue to require close lab monitoring at least every 3 months due to history of neutropenia. TB Gold negative on 06/24/2023. Discussed the importance of postponing Cimzia if she develops signs or symptoms of an infection and to resume once the infection has completely cleared. -  Plan: CBC with Differential/Platelet, Comprehensive metabolic panel with GFR  Iritis: Under care of Dr. Hyacinth Meeker. She seems to be having less frequent flares of iritis.  She has not needed to use prednisone eyedrops as frequently.  She continues to have chronic dry eyes and has been using refresh eyedrops daily. She  will remain on cimzia as prescribed.   Sjogren's syndrome with other organ involvement (HCC) - Ro>8. La-: Evaluated by ENT--diffuse bilateral parotid sialolithiasis-CT 10/04/22-advanced chronic bilateral parotid gland sialolithiasis re-demonstrated. No evidence of parotid inflammation or parotid duct enlargement.  Ro antibody >8 on 11/17/22.  La antibody negative.   Patient continues to have chronic eye dryness--she is using refresh eyedrops as recommended by ophthalmology.  Other neutropenia (HCC): White blood cell count was 3.1 and absolute neutrophils were low on 08/12/2023.  Plan to continue to follow lab work closely.  Primary osteoarthritis of both hands: No synovitis noted on exam.  Primary osteoarthritis of both feet: No discomfort in her feet at this time.  DDD (degenerative disc disease), cervical: Good ROM of the cervical spine.  She has tried dry needling in the past.  Trochanteric bursitis, right hip: Not currently symptomatic.    Osteopenia of multiple sites - DEXA updated on 10/28/2020 AP spine BMD 0.921 with T-score -1.1. -4% change in BMD. Due to update DEXA.   Arthropathy of lumbar facet joint: Her lower back pain has been manageable.  History of scoliosis  Other medical conditions are listed as follows:   History of migraine  History of gastroesophageal reflux (GERD)  History of IBS  History of hypothyroidism  History of hypertension: Blood pressure was elevated today in the office and was rechecked prior to leaving.  Patient has been monitoring her blood pressure closely and has been reporting to Dr. Drue Novel.   Orders: Orders Placed This Encounter   Procedures   CBC with Differential/Platelet   Comprehensive metabolic panel with GFR   Meds ordered this encounter  Medications   certolizumab pegol (CIMZIA) kit 400 mg      Follow-Up Instructions: Return in about 3 months (around 11/18/2023) for Rheumatoid arthritis, Sjogren's syndrome.   Gearldine Bienenstock, PA-C  Note - This record has been created using Dragon software.  Chart creation errors have been sought, but may not always  have been located. Such creation errors do not reflect on  the standard of medical care.

## 2023-08-06 ENCOUNTER — Encounter: Payer: Self-pay | Admitting: Podiatry

## 2023-08-06 ENCOUNTER — Ambulatory Visit: Admitting: Podiatry

## 2023-08-06 DIAGNOSIS — M21961 Unspecified acquired deformity of right lower leg: Secondary | ICD-10-CM

## 2023-08-06 DIAGNOSIS — M21962 Unspecified acquired deformity of left lower leg: Secondary | ICD-10-CM | POA: Diagnosis not present

## 2023-08-06 LAB — HM MAMMOGRAPHY

## 2023-08-06 NOTE — Progress Notes (Signed)
 Subjective:  Patient ID: Brittany Hodges, female    DOB: August 07, 1965,  MRN: [id number]  Chief Complaint  Patient presents with   Nail Problem    rm 6: bil discolored toenails/ right foot great toe worse. Pt has a history of darkened toenail. She was recently diagnosed with RA and Sjorgens. She complains of pain to great toes but states she tends to dig into shoes with toes.     58 y.o. female presents with the above complaint.  Patient presents with complaint of bilateral flatfoot deformity.  Patient states that it does cause her some issues she does a lot of exercising.  She wanted to discuss orthotics option denies any other acute complaints.  She has not seen anyone else prior to seeing me for this.  She currently does not want orthotics.  Ice well   Review of Systems: Negative except as noted in the HPI. Denies N/V/F/Ch.  Past Medical History:  Diagnosis Date   Allergic rhinitis, cause unspecified    Anemia    Asthma    Atrial septal defect    s/p repair;   Echo 08/07/10: EF 55-60%; mild MR; atrial septal aneurysm; no residual ASD   Chest pain, unspecified    cardiac cath 07/31/10: normal coronaries; vigorous LVF   Esophageal reflux    Fibromyalgia    Headache(784.0)    Hematuria, unspecified    Hypertension    no meds    Hypothyroidism    Irritable bowel syndrome    Low back pain    Rheumatoid arthritis(714.0)    Dr Victory Dakin   Scoliosis    Sinoatrial node dysfunction (HCC)    eval. for chronotropic competence completed in past   Sjogren's disease (HCC)     Current Outpatient Medications:    amLODipine (NORVASC) 2.5 MG tablet, TAKE 1 TABLET BY MOUTH EVERY DAY, Disp: 30 tablet, Rfl: 11   amLODipine (NORVASC) 5 MG tablet, Take 1 tablet (5 mg total) by mouth daily., Disp: 90 tablet, Rfl: 3   amoxicillin (AMOXIL) 500 MG capsule, Take 2,000 capsules by mouth once. (Patient not taking: Reported on 07/09/2023), Disp: , Rfl:    BLACK CURRANT SEED OIL PO, Take by mouth  daily., Disp: , Rfl:    Carboxymethylcellulose Sodium (EYE DROPS OP), Apply to eye daily., Disp: , Rfl:    certolizumab pegol (CIMZIA) 2 X 200 MG KIT, Inject 400 mg into the skin every 28 (twenty-eight) days. In-office injections, Disp: , Rfl:    Cholecalciferol (VITAMIN D) 50 MCG (2000 UT) tablet, Take 2,000 Units by mouth daily., Disp: , Rfl:    ergocalciferol (VITAMIN D2) 1.25 MG (50000 UT) capsule, Take 1 capsule (50,000 Units total) by mouth once a week., Disp: 12 capsule, Rfl: 0   meclizine (ANTIVERT) 12.5 MG tablet, Take 1 tablet (12.5 mg total) by mouth 3 (three) times daily as needed for dizziness. (Patient not taking: Reported on 07/09/2023), Disp: 30 tablet, Rfl: 0   prednisoLONE acetate (PRED FORTE) 1 % ophthalmic suspension, Place 1 drop into both eyes daily., Disp: , Rfl:   Social History   Tobacco Use  Smoking Status Never   Passive exposure: Never  Smokeless Tobacco Never  Tobacco Comments   never used tobacco    Allergies  Allergen Reactions   Etanercept    Methotrexate Derivatives     Caused her WBC to drop   Pregabalin    Objective:  There were no vitals filed for this visit. There is no height or weight on  file to calculate BMI. Constitutional Well developed. Well nourished.  Vascular Dorsalis pedis pulses palpable bilaterally. Posterior tibial pulses palpable bilaterally. Capillary refill normal to all digits.  No cyanosis or clubbing noted. Pedal hair growth normal.  Neurologic Normal speech. Oriented to person, place, and time. Epicritic sensation to light touch grossly present bilaterally.  Dermatologic Nails well groomed and normal in appearance. No open wounds. No skin lesions.  Orthopedic: # Gait examination shows pes planovalgus with calcaneovalgus to many toe signs partially recurred the arch with dorsiflexion of the hallux unable to perform single double heel raise.  There   Radiographs: None Assessment:   1. Foot deformity, right   2. Foot  deformity, left    Plan:  Patient was evaluated and treated and all questions answered.  Pes planovalgus/foot deformity -I explained to patient the etiology of pes planovalgus and relationship with Planter fasciitis and various treatment options were discussed.  Given patient foot structure in the setting of Planter fasciitis I believe patient will benefit from custom-made orthotics to help control the hindfoot motion support the arch of the foot and take the stress away from plantar fascial.  Patient agrees with the plan like to proceed with orthotics -Patient was casted for orthotics  -  No follow-ups on file.

## 2023-08-07 ENCOUNTER — Other Ambulatory Visit: Payer: Self-pay | Admitting: Internal Medicine

## 2023-08-11 ENCOUNTER — Telehealth: Payer: Self-pay | Admitting: *Deleted

## 2023-08-11 NOTE — Telephone Encounter (Signed)
 It may be difficult to clear the fungal infection while on cimzia but I am worried about her uveitis and joints flaring if she hold cimzia.  Is she being prescribed a topical or oral antifungal for treatment?

## 2023-08-11 NOTE — Telephone Encounter (Signed)
 Patient advised it may be difficult to clear the fungal infection while on cimzia but Brittany Hodges is also worried about her uveitis and joints flaring if she hold cimzia. Patient states she is trying to do something natural. Patient states she is using olive oil and oregano.

## 2023-08-11 NOTE — Telephone Encounter (Signed)
 Patient contacted the office stating she had a mammogram done which led to her having an ultrasound. Patient states she it did show an abnormal lymph node. Patient states she was advised that this could be due to her RA. Patient states they have not mentioned a biopsy at this time. Patient wanted to know if this is something that would be recommended. Patient advised she would need to reach out to [facility] to consult with them if a biopsy is warranted. Patient states she also recently saw her podiatrist. Patient states he mentioned that she has a small fungal infection under her toenail. Patient would like to know if she needs to make any changes to her medication. Patient is on Cimzia monthly and due for next injection on 08/19/2023.

## 2023-08-11 NOTE — Telephone Encounter (Signed)
Attempted to contact the patient and and left message for patient to call the office.

## 2023-08-13 ENCOUNTER — Encounter: Payer: Self-pay | Admitting: Internal Medicine

## 2023-08-13 ENCOUNTER — Ambulatory Visit: Admitting: Internal Medicine

## 2023-08-13 VITALS — BP 138/84 | HR 47 | Temp 98.3°F | Resp 16 | Ht 68.5 in | Wt 143.1 lb

## 2023-08-13 DIAGNOSIS — I10 Essential (primary) hypertension: Secondary | ICD-10-CM

## 2023-08-13 NOTE — Patient Instructions (Addendum)
  Check the  blood pressure regularly Blood pressure goal:  between 110/65 and  135/85. If it is consistently higher or lower, let me know    Rest, fluids , tylenol   For nasal congestion: -Use over-the-counter Flonase: 2 nasal sprays on each side of the nose in the morning until you feel better  -Use OTC Astepro 2 nasal sprays on each side of the nose twice daily until better  Avoid decongestants such as  Pseudoephedrine or phenylephrine    Take   Zyrtec 10 mg OTC for allergies.  Call if not gradually better

## 2023-08-13 NOTE — Progress Notes (Signed)
 Subjective:    Patient ID: Brittany Hodges, female    DOB: 1965-12-11, 58 y.o.   MRN: [id number]  DOS:  08/13/2023 Type of visit - description: ED follow-up  Went to the ER yesterday, mostly because her BP was elevated.  Had bilateral maxillary sinus pain.  She thinks that is the reason why her BP was elevated. Symptoms started 2 to 3 days ago. No fever or chills.  No cough.  Some postnasal dripping. Admits to itchy eyes and nose.   At the ER: BP was 187/90, improved when they rechecked. Heart rate was 48. EKG no acute changes. Calcium 10.4 slightly high otherwise CMP okay. Platelets slightly low  Today she feels well, still with some sinus congestion, she believes it is mostly allergies  Review of Systems See above   Past Medical History:  Diagnosis Date   Allergic rhinitis, cause unspecified    Anemia    Asthma    Atrial septal defect    s/p repair;   Echo 08/07/10: EF 55-60%; mild MR; atrial septal aneurysm; no residual ASD   Chest pain, unspecified    cardiac cath 07/31/10: normal coronaries; vigorous LVF   Esophageal reflux    Fibromyalgia    Headache(784.0)    Hematuria, unspecified    Hypertension    no meds    Hypothyroidism    Irritable bowel syndrome    Low back pain    Rheumatoid arthritis(714.0)    Dr Victory Dakin   Scoliosis    Sinoatrial node dysfunction (HCC)    eval. for chronotropic competence completed in past   Sjogren's disease (HCC)     Past Surgical History:  Procedure Laterality Date   ASD REPAIR  1985   TONSILLECTOMY AND ADENOIDECTOMY     TUBAL LIGATION      Current Outpatient Medications  Medication Instructions   amLODipine (NORVASC) 2.5 mg, Oral, Daily   amoxicillin (AMOXIL) 500 MG capsule 2,000 capsules,  Once   BLACK CURRANT SEED OIL PO Daily   Carboxymethylcellulose Sodium (EYE DROPS OP) Daily   Cimzia 400 mg, Every 28 days   ergocalciferol (VITAMIN D2) 50,000 Units, Oral, Weekly   meclizine (ANTIVERT) 12.5 mg, Oral, 3  times daily PRN   prednisoLONE acetate (PRED FORTE) 1 % ophthalmic suspension 1 drop, Daily   Vitamin D 2,000 Units, Daily       Objective:   Physical Exam BP 138/84   Pulse (!) 47   Temp 98.3 F (36.8 C) (Oral)   Resp 16   Ht 5' 8.5" (1.74 m)   Wt 143 lb 2 oz (64.9 kg)   LMP 05/01/2016   SpO2 97%   BMI 21.45 kg/m  General:   Well developed, NAD, BMI noted. HEENT:  Normocephalic . Face symmetric, atraumatic TMs: Normal Nose: Slightly congested Throat: Symmetric Lungs:  CTA B Normal respiratory effort, no intercostal retractions, no accessory muscle use. Heart: RRR,  no murmur.  Lower extremities: no pretibial edema bilaterally  Skin: Not pale. Not jaundice Neurologic:  alert & oriented X3.  Speech normal, gait appropriate for age and unassisted Psych--  Cognition and judgment appear intact.  Cooperative with normal attention span and concentration.  Behavior appropriate. No anxious or depressed appearing.      Assessment    Assessment   Prediabetes: A1c 6.0 (01-2017) HTN H/o Hypothyroidism (took meds at some point) GERD- dysphagia -Dr. Baldwin [place] EGD from 04/02/2014 (Dr Boyd Kerbs): Narrowing of proximal esophagus, question of extrinsic extrinsic compression, EUS showing no intrinsic esophageal  mass but the spine was seen in close proximity  to the esophagus probably resulting in extrinsic compression. The endoscopist recommend repeat CT neck and chest in 3 months.saw ENT 04-2015 for dysphagia,had a scope,  rx PPI Asthma, uses alb rarely  CV: --Atrial septal defect: Status post repair, no residual ASD: Needs ABX prophylaxis preprocedures --Sinoatrial  Dysfunction, sees Dr Graciela Husbands   --CP: cath 2012, normal coronaries MSK: --Rheumatoid arthritis,   Dr. Victory Dakin + rheumatoid factor +CCP,   h/o  low WBC with Methotrexate,   injection site reaction to Enbrel  , self d/c Humira ~ 12/2018 --Osteopenia: T score -1.7 (2020), -1.1 10/2020 IBS (Miralax prn constipation)    syncope 04-2016  History of fibromyalgia   PLAN:  Sinusitis: Likely allergic, less likely bacterial.  Recommend conservative treatment, call next week if not improving.  Antibiotics? HTN: Since last visit, we agreed to decrease amlodipine to 2.5, BP has been typically okay except in the last few days, it was elevated she thinks related to sinus pain.  BP today satisfactory, no change, continue monitoring. RTC: Scheduled for August

## 2023-08-13 NOTE — Telephone Encounter (Signed)
Pt.has an appt.today w/PCP.

## 2023-08-14 ENCOUNTER — Encounter (HOSPITAL_BASED_OUTPATIENT_CLINIC_OR_DEPARTMENT_OTHER): Payer: Self-pay

## 2023-08-14 ENCOUNTER — Emergency Department (HOSPITAL_BASED_OUTPATIENT_CLINIC_OR_DEPARTMENT_OTHER): Admission: EM | Admit: 2023-08-14 | Discharge: 2023-08-14 | Disposition: A | Discharge status: Home / Self Care

## 2023-08-14 ENCOUNTER — Other Ambulatory Visit: Payer: Self-pay

## 2023-08-14 DIAGNOSIS — I10 Essential (primary) hypertension: Secondary | ICD-10-CM | POA: Diagnosis not present

## 2023-08-14 DIAGNOSIS — Z79899 Other long term (current) drug therapy: Secondary | ICD-10-CM | POA: Insufficient documentation

## 2023-08-14 DIAGNOSIS — R030 Elevated blood-pressure reading, without diagnosis of hypertension: Secondary | ICD-10-CM | POA: Diagnosis present

## 2023-08-14 DIAGNOSIS — I159 Secondary hypertension, unspecified: Secondary | ICD-10-CM

## 2023-08-14 NOTE — ED Provider Notes (Signed)
 [place] [facility] Provider Note   CSN: [id number] Arrival date & time: 08/14/23  0930     History  Chief Complaint  Patient presents with   Hypertension    Brittany Hodges is a 58 y.o. female.  This is a 58 year old female presenting emergency department for hypertension.  She has been having some sinus pain for the past several days.  Blood pressure has been elevated.  Was seen a few days ago for the same, followed up with primary doctor yesterday who made no medication adjustments.  Used to be on 5 mg of amlodipine, currently on 2.5.  Not having headache, vision changes, chest pain, shortness of breath, abdominal pain, urinating without issue   Hypertension       Home Medications Prior to Admission medications   Medication Sig Start Date End Date Taking? Authorizing Provider  amLODipine (NORVASC) 2.5 MG tablet TAKE 1 TABLET BY MOUTH EVERY DAY 07/12/23   [name], MD  amoxicillin (AMOXIL) 500 MG capsule Take 2,000 capsules by mouth once. Patient not taking: Reported on 05/21/2023 03/02/22   [provider]  BLACK CURRANT SEED OIL PO Take by mouth daily.    [provider]  Carboxymethylcellulose Sodium (EYE DROPS OP) Apply to eye daily.    [provider]  certolizumab pegol (CIMZIA) 2 X 200 MG KIT Inject 400 mg into the skin every 28 (twenty-eight) days. In-office injections    [provider]  Cholecalciferol (VITAMIN D) 50 MCG (2000 UT) tablet Take 2,000 Units by mouth daily.    [provider]  ergocalciferol (VITAMIN D2) 1.25 MG (50000 UT) capsule Take 1 capsule (50,000 Units total) by mouth once a week. 07/13/23   [name], MD  meclizine (ANTIVERT) 12.5 MG tablet Take 1 tablet (12.5 mg total) by mouth 3 (three) times daily as needed for dizziness. Patient not taking: Reported on 08/13/2023 01/20/22   Loeffler, Finis Bud, PA-C  prednisoLONE acetate (PRED FORTE) 1 % ophthalmic suspension  Place 1 drop into both eyes daily. 06/01/18   [provider]      Allergies    Etanercept, Methotrexate derivatives, and Pregabalin    Review of Systems   Review of Systems  Physical Exam Updated Vital Signs BP (!) 169/94   Pulse (!) 50   Temp 98.2 F (36.8 C) (Oral)   Resp 16   LMP 05/01/2016   SpO2 100%  Physical Exam Vitals and nursing note reviewed.  Constitutional:      General: She is not in acute distress. HENT:     Head: Normocephalic.     Nose: Nose normal.  Eyes:     Pupils: Pupils are equal, round, and reactive to light.  Cardiovascular:     Rate and Rhythm: Normal rate.  Pulmonary:     Effort: Pulmonary effort is normal.  Skin:    General: Skin is warm.  Neurological:     Mental Status: She is oriented to person, place, and time.  Psychiatric:        Mood and Affect: Mood normal.        Behavior: Behavior normal.     ED Results / Procedures / Treatments   Labs (all labs ordered are listed, but only abnormal results are displayed) Labs Reviewed - No data to display  EKG None  Radiology No results found.  Procedures Procedures    Medications Ordered in ED Medications - No data to display  ED Course/  Medical Decision Making/ A&P                                 Medical Decision Making This is a 58 year old female presenting emergency department for elevated blood pressure.  She is 168/108.  She is asymptomatic.  Had labs done 2 days ago normal.  Supportive medications ordered that time for seasonal allergies.  Based off of a sepsis current guidelines for asymptomatic hypertension no further intervention needed.  Discussed in depth with patient.  Recommended taking a additional dose of amlodipine when she gets home and following back up with her primary doctor.  Stable for discharge at this time.          Final Clinical Impression(s) / ED Diagnoses Final diagnoses:  None    Rx / DC Orders ED Discharge Orders     None          Coral Spikes, DO 08/14/23 1002

## 2023-08-14 NOTE — ED Triage Notes (Signed)
 C/o hypertension this morning (167/107) with slight headache. Taking antihypertensives, saw PCP yesterday with normal BP. Was at [facility] on Thursday for same issue.

## 2023-08-14 NOTE — Discharge Instructions (Signed)
 You may take another short dose of your 2.5 mg amlodipine for persistently elevated blood pressure greater than 140/90.  Please follow-up with your primary doctor.  Return immediately if develop sudden onset headache, vision changes, facial droop, unilateral weakness, seizures, chest pain, shortness of breath, stop making urine,pass out or any you develop any new or worsening symptoms that are concerning to you.

## 2023-08-15 ENCOUNTER — Telehealth: Payer: Self-pay | Admitting: Internal Medicine

## 2023-08-15 NOTE — Assessment & Plan Note (Signed)
 Sinusitis: Likely allergic, less likely bacterial.  Recommend conservative treatment, call next week if not improving.  Antibiotics? HTN: Since last visit, we agreed to decrease amlodipine to 2.5, BP has been typically okay except in the last few days, it was elevated she thinks related to sinus pain.  BP today satisfactory, no change, continue monitoring. RTC: Scheduled for August

## 2023-08-15 NOTE — Telephone Encounter (Signed)
 The next day I saw her (08/13/2023) went to the emergency room w/ elevated blood pressure.  BP was 169/94. Please call patient, increase amlodipine to 5 mg every day. Continue monitoring BPs.

## 2023-08-16 ENCOUNTER — Ambulatory Visit: Payer: Managed Care, Other (non HMO) | Admitting: Internal Medicine

## 2023-08-16 ENCOUNTER — Telehealth: Payer: Self-pay

## 2023-08-16 NOTE — Telephone Encounter (Signed)
 Patient notified and appt scheduled for follow up 08/20/23.

## 2023-08-16 NOTE — Telephone Encounter (Signed)
 Reached out to patient. She states she has several issues going on and would like to cancel her Cimzia for now. Patient states she has been having issues with her blood pressure being elevated. She has to see GI because she is having rectal bleeding. She also has to see the doctor for a possible infection in her gums. Patient will keep her follow up visit and postpone her Cimzia.

## 2023-08-16 NOTE — Telephone Encounter (Signed)
 Patient contacted the office and states she needs to speak to [facility] Patient states she needs to cancel her appointment with Sue Lush and let her know what's going on. Please advise.

## 2023-08-18 ENCOUNTER — Telehealth: Payer: Self-pay | Admitting: Internal Medicine

## 2023-08-18 NOTE — Telephone Encounter (Signed)
 1. What is your BP concern?  Has been high since last week   2. Have you taken any BP medication today? Patient says she took Amlodipine 5 MG  3. What are your last 5 BP readings? 4/02: 148/96 4/01: 150/85 Has gotten up to around 160's/101  4. Are you having any other symptoms (ex. Dizziness, headache, blurred vision, passed out)?  No, but has sinus issues causing inflammation in jaw. She says she's on Prednisone. She and her doctor think that's what's causing it.

## 2023-08-18 NOTE — Telephone Encounter (Signed)
 Spoke with pt. Pt's BP's have been coming up and down for the past week. BP's as high as 150/85 but are coming back down. Pt feels fine and is not having any symptoms, however is having pain and inflammation in jaw which she has been to dentist for and was put on prednisone. Is taking over the counter pain medication. Hr's have been normal and stable. Informed pt this may be from pain, Told her I would forward to Dr Graciela Husbands and his nurse for review. ED/911 precautions reviewed with pt.

## 2023-08-18 NOTE — Telephone Encounter (Signed)
 Spoke with pt who has seen her PCP and was advised on 08/15/23 to increase Amlodipine to 5mg  daily.  Pt states she has made this change and has follow up with her PCP this Friday 08/20/23.  She is keeping a log of her BP.   Encouraged pt to check BP 2 hours after taking Amlodipine 5mg  for best idea of response to medication and to continue follow up with PCP for HTN.  Pt verbalizes understanding and thanked Charity fundraiser for the callback.

## 2023-08-19 ENCOUNTER — Ambulatory Visit: Payer: Managed Care, Other (non HMO) | Attending: Physician Assistant | Admitting: Physician Assistant

## 2023-08-19 ENCOUNTER — Encounter: Payer: Self-pay | Admitting: Physician Assistant

## 2023-08-19 ENCOUNTER — Ambulatory Visit

## 2023-08-19 VITALS — BP 153/78 | HR 46 | Resp 14 | Ht 68.5 in | Wt 143.0 lb

## 2023-08-19 DIAGNOSIS — M3509 Sicca syndrome with other organ involvement: Secondary | ICD-10-CM

## 2023-08-19 DIAGNOSIS — H209 Unspecified iridocyclitis: Secondary | ICD-10-CM | POA: Diagnosis not present

## 2023-08-19 DIAGNOSIS — Z8639 Personal history of other endocrine, nutritional and metabolic disease: Secondary | ICD-10-CM

## 2023-08-19 DIAGNOSIS — M0579 Rheumatoid arthritis with rheumatoid factor of multiple sites without organ or systems involvement: Secondary | ICD-10-CM | POA: Diagnosis not present

## 2023-08-19 DIAGNOSIS — Z8669 Personal history of other diseases of the nervous system and sense organs: Secondary | ICD-10-CM

## 2023-08-19 DIAGNOSIS — D708 Other neutropenia: Secondary | ICD-10-CM

## 2023-08-19 DIAGNOSIS — M19072 Primary osteoarthritis, left ankle and foot: Secondary | ICD-10-CM

## 2023-08-19 DIAGNOSIS — Z79899 Other long term (current) drug therapy: Secondary | ICD-10-CM | POA: Diagnosis not present

## 2023-08-19 DIAGNOSIS — M7061 Trochanteric bursitis, right hip: Secondary | ICD-10-CM

## 2023-08-19 DIAGNOSIS — M19041 Primary osteoarthritis, right hand: Secondary | ICD-10-CM

## 2023-08-19 DIAGNOSIS — Z8679 Personal history of other diseases of the circulatory system: Secondary | ICD-10-CM

## 2023-08-19 DIAGNOSIS — Z8719 Personal history of other diseases of the digestive system: Secondary | ICD-10-CM

## 2023-08-19 DIAGNOSIS — M8589 Other specified disorders of bone density and structure, multiple sites: Secondary | ICD-10-CM

## 2023-08-19 DIAGNOSIS — M19071 Primary osteoarthritis, right ankle and foot: Secondary | ICD-10-CM

## 2023-08-19 DIAGNOSIS — M47816 Spondylosis without myelopathy or radiculopathy, lumbar region: Secondary | ICD-10-CM

## 2023-08-19 DIAGNOSIS — M19042 Primary osteoarthritis, left hand: Secondary | ICD-10-CM

## 2023-08-19 DIAGNOSIS — M5030 Other cervical disc degeneration, unspecified cervical region: Secondary | ICD-10-CM

## 2023-08-19 DIAGNOSIS — Z8739 Personal history of other diseases of the musculoskeletal system and connective tissue: Secondary | ICD-10-CM

## 2023-08-19 MED ORDER — CERTOLIZUMAB PEGOL 2 X 200 MG ~~LOC~~ KIT
400.0000 mg | PACK | Freq: Once | SUBCUTANEOUS | Status: AC
  Administered 2023-08-19: 400 mg via SUBCUTANEOUS

## 2023-08-19 NOTE — Patient Instructions (Signed)

## 2023-08-19 NOTE — Progress Notes (Signed)
 Subjective:   Patient presents to clinic today to receive monthly dose of Cimzia.  Patient running a fever or have signs/symptoms of infection? No  Patient currently on antibiotics for the treatment of infection? No  Patient have any upcoming invasive procedures/surgeries? No  Objective: CMP     Component Value Date/Time   NA 138 06/24/2023 1518   NA 144 04/08/2021 1216   K 3.8 06/24/2023 1518   CL 104 06/24/2023 1518   CO2 27 06/24/2023 1518   GLUCOSE 85 06/24/2023 1518   BUN 13 06/24/2023 1518   BUN 15 04/08/2021 1216   CREATININE 0.93 06/24/2023 1518   CALCIUM 9.9 06/24/2023 1518   PROT 7.8 06/24/2023 1518   PROT 7.9 04/08/2021 1216   ALBUMIN 4.0 10/04/2022 0703   ALBUMIN 4.5 04/08/2021 1216   AST 32 06/24/2023 1518   AST 21 02/18/2021 1452   ALT 29 06/24/2023 1518   ALT 13 02/18/2021 1452   ALKPHOS 54 10/04/2022 0703   BILITOT 0.4 06/24/2023 1518   BILITOT 0.3 04/08/2021 1216   BILITOT 0.4 02/18/2021 1452   GFRNONAA >60 10/04/2022 0703   GFRNONAA 53 (L) 02/18/2021 1452   GFRNONAA 77 02/15/2020 1023   GFRAA 89 02/15/2020 1023    CBC    Component Value Date/Time   WBC 4.3 06/24/2023 1518   RBC 4.39 06/24/2023 1518   HGB 12.2 06/24/2023 1518   HGB 12.3 04/08/2021 1216   HGB 11.2 (L) 07/26/2013 0802   HCT 39.0 06/24/2023 1518   HCT 39.3 04/08/2021 1216   HCT 35.5 07/26/2013 0802   PLT 134 (L) 06/24/2023 1518   PLT 140 (L) 04/08/2021 1216   MCV 88.8 06/24/2023 1518   MCV 90 04/08/2021 1216   MCV 86 07/26/2013 0802   MCH 27.8 06/24/2023 1518   MCHC 31.3 (L) 06/24/2023 1518   RDW 11.8 06/24/2023 1518   RDW 12.0 04/08/2021 1216   RDW 13.6 07/26/2013 0802   LYMPHSABS 1,395 02/22/2023 1546   LYMPHSABS 2.0 04/08/2021 1216   LYMPHSABS 0.9 07/26/2013 0802   MONOABS 1.4 (H) 10/04/2022 0703   EOSABS 0 (L) 06/24/2023 1518   EOSABS 0.0 04/08/2021 1216   EOSABS 0.0 07/26/2013 0802   BASOSABS 22 06/24/2023 1518   BASOSABS 0.0 04/08/2021 1216   BASOSABS 0.0  07/26/2013 0802    Baseline Immunosuppressant Therapy Labs TB GOLD    Latest Ref Rng & Units 06/24/2023    3:18 PM  Quantiferon TB Gold  Quantiferon TB Gold Plus NEGATIVE NEGATIVE    Hepatitis Panel    Latest Ref Rng & Units 06/23/2018    3:51 PM  Hepatitis  Hep B Surface Ag NON-REACTI NON-REACTIVE   Hep B IgM NON-REACTI NON-REACTIVE   Hep C Ab NON-REACTI NON-REACTIVE   Hep A IgM NON-REACTI NON-REACTIVE    HIV Lab Results  Component Value Date   HIV NONREACTIVE 12/21/2014   Immunoglobulins    Latest Ref Rng & Units 06/23/2018    3:51 PM  Immunoglobulin Electrophoresis  IgA  47 - 310 mg/dL 295   IgG 621 - 3,086 mg/dL 5,784   IgM 50 - 696 mg/dL 295    SPEP    Latest Ref Rng & Units 06/24/2023    3:18 PM  Serum Protein Electrophoresis  Total Protein 6.1 - 8.1 g/dL 7.8    M8UX No results found for: "G6PDH" TPMT No results found for: "TPMT"   Chest x-ray: 09/16/20-Stable exam without acute or active cardiopulmonary disease.   Assessment/Plan:  Administrations This Visit     certolizumab pegol (CIMZIA) kit 400 mg     Admin Date 08/19/2023 Action Given Dose 400 mg Route Subcutaneous Documented By Henriette Combs, LPN             Patient tolerated injection well.   Appointment for next injection scheduled for 09/16/2023.  Patient due for labs in June 2025.  Patient is to call and reschedule appointment if running a fever with signs/symptoms of infection, on antibiotics for active infection or has an upcoming invasive procedure.  All questions encouraged and answered.  Instructed patient to call with any further questions or concerns.

## 2023-08-20 ENCOUNTER — Ambulatory Visit: Admitting: Internal Medicine

## 2023-08-20 ENCOUNTER — Encounter: Payer: Self-pay | Admitting: Internal Medicine

## 2023-08-20 VITALS — BP 138/80 | HR 61 | Temp 97.9°F | Resp 18 | Ht 68.5 in | Wt 143.0 lb

## 2023-08-20 DIAGNOSIS — I10 Essential (primary) hypertension: Secondary | ICD-10-CM

## 2023-08-20 NOTE — Progress Notes (Signed)
 Subjective:    Patient ID: Brittany Hodges, female    DOB: [birth year], 58 y.o.   MRN: [id number]  DOS:  08/20/2023 Type of visit - description: BP follow-up  Reports good med compliance, not taking NSAIDs. She is taking prednisone. Denies any symptoms at this point.  Review of Systems See above   Past Medical History:  Diagnosis Date   Allergic rhinitis, cause unspecified    Anemia    Asthma    Atrial septal defect    s/p repair;   Echo 08/07/10: EF 55-60%; mild MR; atrial septal aneurysm; no residual ASD   Chest pain, unspecified    cardiac cath 07/31/10: normal coronaries; vigorous LVF   Esophageal reflux    Fibromyalgia    Headache(784.0)    Hematuria, unspecified    Hypertension    no meds    Hypothyroidism    Irritable bowel syndrome    Low back pain    Rheumatoid arthritis(714.0)    Dr Victory Dakin   Scoliosis    Sinoatrial node dysfunction (HCC)    eval. for chronotropic competence completed in past   Sjogren's disease (HCC)     Past Surgical History:  Procedure Laterality Date   ASD REPAIR  1985   TONSILLECTOMY AND ADENOIDECTOMY     TUBAL LIGATION      Current Outpatient Medications  Medication Instructions   amLODipine (NORVASC) 5 mg, Daily   amoxicillin (AMOXIL) 500 MG capsule 2,000 capsules,  Once   BLACK CURRANT SEED OIL PO Daily   Carboxymethylcellulose Sodium (EYE DROPS OP) Daily   Cimzia 400 mg, Every 28 days   ergocalciferol (VITAMIN D2) 50,000 Units, Oral, Weekly   meclizine (ANTIVERT) 12.5 mg, Oral, 3 times daily PRN   methylPREDNISolone (MEDROL DOSEPAK) 4 MG TBPK tablet SMARTSIG:- Tablet(s) By Mouth -   prednisoLONE acetate (PRED FORTE) 1 % ophthalmic suspension 1 drop, Daily   Vitamin D 2,000 Units, Daily       Objective:   Physical Exam BP 138/80   Pulse 61   Temp 97.9 F (36.6 C) (Oral)   Resp 18   Ht 5' 8.5" (1.74 m)   Wt 143 lb (64.9 kg)   LMP 05/01/2016   SpO2 98%   BMI 21.43 kg/m  General:   Well developed, NAD, BMI  noted. HEENT:  Normocephalic . Face symmetric, atraumatic Lungs:  CTA B Normal respiratory effort, no intercostal retractions, no accessory muscle use. Heart: RRR,  no murmur.  Lower extremities: no pretibial edema bilaterally  Skin: Not pale. Not jaundice Neurologic:  alert & oriented X3.  Speech normal, gait appropriate for age and unassisted Psych--  Cognition and judgment appear intact.  Cooperative with normal attention span and concentration.  Behavior appropriate. No anxious or depressed appearing.      Assessment     Assessment   Prediabetes: A1c 6.0 (01-2017) HTN H/o Hypothyroidism (took meds at some point) GERD- dysphagia -Dr. Baldwin [place] EGD from 04/02/2014 (Dr Boyd Kerbs): Narrowing of proximal esophagus, question of extrinsic extrinsic compression, EUS showing no intrinsic esophageal mass but the spine was seen in close proximity  to the esophagus probably resulting in extrinsic compression. The endoscopist recommend repeat CT neck and chest in 3 months.saw ENT 04-2015 for dysphagia,had a scope,  rx PPI Asthma, uses alb rarely  CV: --Atrial septal defect: Status post repair, no residual ASD: Needs ABX prophylaxis preprocedures --Sinoatrial  Dysfunction, sees Dr Graciela Husbands   --CP: cath 2012, normal coronaries MSK: --Rheumatoid arthritis,   Dr. Victory Dakin +  rheumatoid factor +CCP,   h/o  low WBC with Methotrexate,   injection site reaction to Enbrel  , self d/c Humira ~ 12/2018 --Osteopenia: T score -1.7 (2020), -1.1 10/2020 IBS (Miralax prn constipation)   syncope 04-2016  History of fibromyalgia   PLAN:  HTN: Seen here 08/13/2023, we agreed to continue amlodipine 2.5 mg daily, subsequently went to the ER, BP was 169/94.  Amlodipine increased to 5 mg daily on 08/15/2023. Yesterday at rheumatology BP was 153/78. At this point, she is asymptomatic.  BP today 138/80. Plan: Continue checking ambulatory BPs, extensive discussion about appropriate way to check BPs, see AVS. RTC  scheduled for August

## 2023-08-20 NOTE — Assessment & Plan Note (Signed)
 HTN: Seen here 08/13/2023, we agreed to continue amlodipine 2.5 mg daily, subsequently went to the ER, BP was 169/94.  Amlodipine increased to 5 mg daily on 08/15/2023. Yesterday at rheumatology BP was 153/78. At this point, she is asymptomatic.  BP today 138/80. Plan: Continue checking ambulatory BPs, extensive discussion about appropriate way to check BPs, see AVS. RTC scheduled for August

## 2023-08-20 NOTE — Patient Instructions (Addendum)
 Continue the same medications.  Check your blood pressure at home, after resting for 10 minutes having a glass of water. When you check, sit down, uncross your legs, feet on the ground. If your blood pressure is elevated, rest for additional 10 minutes and check again.  If it is consistently in the 160s let me know.  ER if: BP is much more elevated like in the 180s, 190s or if you have symptoms such as chest pain, headache, nosebleeds.   Blood pressure goals: Blood pressure goal:  between 110/65 and  135/85.   See you in August, sooner if needed.

## 2023-08-23 ENCOUNTER — Telehealth: Payer: Self-pay

## 2023-08-23 NOTE — Telephone Encounter (Signed)
 LEFT VM TO SCHEDULE ORTHOTIC PICK UP

## 2023-08-25 ENCOUNTER — Other Ambulatory Visit: Payer: Self-pay

## 2023-08-25 ENCOUNTER — Encounter (HOSPITAL_BASED_OUTPATIENT_CLINIC_OR_DEPARTMENT_OTHER): Payer: Self-pay | Admitting: Urology

## 2023-08-25 ENCOUNTER — Emergency Department (HOSPITAL_BASED_OUTPATIENT_CLINIC_OR_DEPARTMENT_OTHER)

## 2023-08-25 ENCOUNTER — Emergency Department (HOSPITAL_BASED_OUTPATIENT_CLINIC_OR_DEPARTMENT_OTHER)
Admission: EM | Admit: 2023-08-25 | Discharge: 2023-08-25 | Disposition: A | Discharge status: Home / Self Care | Attending: Emergency Medicine | Admitting: Emergency Medicine

## 2023-08-25 DIAGNOSIS — R079 Chest pain, unspecified: Secondary | ICD-10-CM | POA: Diagnosis not present

## 2023-08-25 DIAGNOSIS — Z79899 Other long term (current) drug therapy: Secondary | ICD-10-CM | POA: Diagnosis not present

## 2023-08-25 DIAGNOSIS — E039 Hypothyroidism, unspecified: Secondary | ICD-10-CM | POA: Diagnosis not present

## 2023-08-25 DIAGNOSIS — I10 Essential (primary) hypertension: Secondary | ICD-10-CM | POA: Diagnosis not present

## 2023-08-25 DIAGNOSIS — R001 Bradycardia, unspecified: Secondary | ICD-10-CM | POA: Insufficient documentation

## 2023-08-25 LAB — BASIC METABOLIC PANEL WITH GFR
Anion gap: 9 (ref 5–15)
BUN: 13 mg/dL (ref 6–20)
CO2: 26 mmol/L (ref 22–32)
Calcium: 9.9 mg/dL (ref 8.9–10.3)
Chloride: 102 mmol/L (ref 98–111)
Creatinine, Ser: 0.79 mg/dL (ref 0.44–1.00)
GFR, Estimated: >60 mL/min (ref >60)
Glucose, Bld: 108 mg/dL — ABNORMAL HIGH (ref 70–99)
Potassium: 4.1 mmol/L (ref 3.5–5.1)
Sodium: 137 mmol/L (ref 135–145)

## 2023-08-25 LAB — CBC
HCT: 40.7 % (ref 36.0–46.0)
Hemoglobin: 13 g/dL (ref 12.0–15.0)
MCH: 28.3 pg (ref 26.0–34.0)
MCHC: 31.9 g/dL (ref 30.0–36.0)
MCV: 88.7 fL (ref 80.0–100.0)
Platelets: 196 10*3/uL (ref 150–400)
RBC: 4.59 MIL/uL (ref 3.87–5.11)
RDW: 12.5 % (ref 11.5–15.5)
WBC: 7.2 10*3/uL (ref 4.0–10.5)
nRBC: 0 % (ref 0.0–0.2)

## 2023-08-25 LAB — TROPONIN I (HIGH SENSITIVITY)
Troponin I (High Sensitivity): 4 ng/L (ref <18)
Troponin I (High Sensitivity): 4 ng/L (ref <18)

## 2023-08-25 NOTE — ED Triage Notes (Signed)
 Pt states HR was 41 at home and feels lightheaded States chest pain radiating to back that started at 1600   H/o bradycardia

## 2023-08-25 NOTE — ED Notes (Signed)
 Pt ambulated to restroom and reported mild lightheadedness but overall ok

## 2023-08-25 NOTE — ED Notes (Signed)
 Patient transported to X-ray

## 2023-08-25 NOTE — Discharge Instructions (Addendum)
 Follow-up with your cardiologist to be rechecked.  Return to the ER for fainting spells weakness or other concerning symptoms

## 2023-08-25 NOTE — ED Provider Notes (Signed)
 [place] [facility] Provider Note   CSN: [id number] Arrival date & time: 08/25/23  1803     History  Chief Complaint  Patient presents with   Chest Pain   Bradycardia    Brittany Hodges is a 58 y.o. female.   Chest Pain    Patient has a history of chest pain, sinoatrial node dysfunction, hypothyroidism, acid reflux, rheumatoid arthritis, hypertension, Sjogren's disease.  Patient states she has a history of bradycardia.  Today however the heart rate dropped to 41 and she started to feel lightheaded.  She also has been having intermittent episodes of chest pain this rating to her back started about 4 PM today.  She denies any fevers or chills.  Cough.  No vomiting or diarrhea.  Home Medications Prior to Admission medications   Medication Sig Start Date End Date Taking? Authorizing Provider  amLODipine (NORVASC) 5 MG tablet Take 5 mg by mouth daily.    [provider]  amoxicillin (AMOXIL) 500 MG capsule Take 2,000 capsules by mouth once. Patient not taking: Reported on 08/20/2023 03/02/22   [provider]  BLACK CURRANT SEED OIL PO Take by mouth daily.    [provider]  Carboxymethylcellulose Sodium (EYE DROPS OP) Apply to eye daily.    [provider]  certolizumab pegol (CIMZIA) 2 X 200 MG KIT Inject 400 mg into the skin every 28 (twenty-eight) days. In-office injections    [provider]  Cholecalciferol (VITAMIN D) 50 MCG (2000 UT) tablet Take 2,000 Units by mouth daily.    [provider]  ergocalciferol (VITAMIN D2) 1.25 MG (50000 UT) capsule Take 1 capsule (50,000 Units total) by mouth once a week. 07/13/23   [name], MD  meclizine (ANTIVERT) 12.5 MG tablet Take 1 tablet (12.5 mg total) by mouth 3 (three) times daily as needed for dizziness. 01/20/22   Loeffler, Finis Bud, PA-C  methylPREDNISolone (MEDROL DOSEPAK) 4 MG TBPK tablet SMARTSIG:- Tablet(s) By Mouth - 08/17/23   [provider]  prednisoLONE acetate (PRED FORTE) 1 % ophthalmic suspension Place 1 drop into both eyes daily. 06/01/18   [provider]      Allergies    Etanercept, Methotrexate derivatives, and Pregabalin    Review of Systems   Review of Systems  Cardiovascular:  Positive for chest pain.    Physical Exam Updated Vital Signs BP (!) 146/83   Pulse (!) 44   Temp (!) 97 F (36.1 C)   Resp 15   Ht 1.727 m (5\' 8" )   Wt 64.8 kg   LMP 05/01/2016   SpO2 98%   BMI 21.72 kg/m  Physical Exam Vitals and nursing note reviewed.  Constitutional:      Appearance: She is well-developed. She is not diaphoretic.  HENT:     Head: Normocephalic and atraumatic.     Right Ear: External ear normal.     Left Ear: External ear normal.  Eyes:     General: No scleral icterus.       Right eye: No discharge.        Left eye: No discharge.     Conjunctiva/sclera: Conjunctivae normal.  Neck:     Trachea: No tracheal deviation.  Cardiovascular:     Rate and Rhythm: Regular rhythm. Bradycardia present.  Pulmonary:     Effort: Pulmonary effort is normal. No respiratory distress.     Breath sounds: Normal breath sounds. No stridor. No wheezing or rales.  Abdominal:     General: Bowel sounds are normal. There is no distension.     Palpations: Abdomen is soft.     Tenderness: There is no abdominal tenderness. There is no guarding or rebound.  Musculoskeletal:        General: No tenderness or deformity.     Cervical back: Neck supple.  Skin:    General: Skin is warm and dry.     Findings: No rash.  Neurological:     General: No focal deficit present.     Mental Status: She is alert.     Cranial Nerves: No cranial nerve deficit, dysarthria or facial asymmetry.     Sensory: No sensory deficit.     Motor: No abnormal muscle tone or seizure activity.     Coordination: Coordination normal.  Psychiatric:        Mood and Affect: Mood normal.     ED Results / Procedures / Treatments    Labs (all labs ordered are listed, but only abnormal results are displayed) Labs Reviewed  BASIC METABOLIC PANEL WITH GFR - Abnormal; Notable for the following components:      Result Value   Glucose, Bld 108 (*)    All other components within normal limits  CBC  TROPONIN I (HIGH SENSITIVITY)  TROPONIN I (HIGH SENSITIVITY)    EKG Interpretation in MUSE  Radiology DG Chest 2 View Result Date: 08/25/2023 CLINICAL DATA:  Chest pain. EXAM: CHEST - 2 VIEW COMPARISON:  10/04/2022 FINDINGS: Median sternotomy. Normal heart size.The cardiomediastinal contours are normal. The lungs are clear. Pulmonary vasculature is normal. No consolidation, pleural effusion, or pneumothorax. Mild scoliotic curvature of the spine. No acute osseous abnormalities are seen. IMPRESSION: No active cardiopulmonary disease. Electronically Signed   By: Narda Rutherford M.D.   On: 08/25/2023 20:40    Procedures Procedures    Medications Ordered in ED Medications - No data to display  ED Course/ Medical Decision Making/ A&P Clinical Course as of 08/25/23 2120  Wed Aug 25, 2023  2017 CBC [JK]  2017 CBC normal.  Metabolic panel normal.  Troponin normal [JK]  2106 Second troponin normal [JK]    Clinical Course User Index [JK] Linwood Dibbles, MD             HEART Score: 2                    Medical Decision Making Amount and/or Complexity of Data Reviewed Labs: ordered. Decision-making details documented in ED Course. Radiology: ordered.   Patient presented ED for evaluation of chest pain and bradycardia.  ED workup reassuring.  No signs of pneumonia or pneumothorax.  Low suspicion for PE.  Patient does not have any dyspnea and symptoms are intermittent nature.  Low risk heart score.  Serial troponins are normal.  Doubt ACS at this time.  Patient also noted to be bradycardic.  She does have a history of same.  Despite her bradycardia she is asymptomatic.  She is not lightheaded or dizzy and she was able to  ambulate without difficulty.  Patient is followed by cardiology for her bradycardia.  Will have her follow-up as an outpatient.        Final Clinical Impression(s) / ED Diagnoses Final diagnoses:  Bradycardia  Chest pain, unspecified type    Rx / DC Orders ED Discharge Orders          Ordered    Ambulatory referral to Cardiology       Comments:  If you have not heard from the Cardiology office within the next 72 hours please call [phone].   08/25/23 2118              Linwood Dibbles, MD 08/25/23 2120

## 2023-09-07 ENCOUNTER — Other Ambulatory Visit: Payer: Self-pay | Admitting: Internal Medicine

## 2023-09-16 ENCOUNTER — Ambulatory Visit

## 2023-09-16 ENCOUNTER — Encounter: Payer: Self-pay | Admitting: Pharmacist

## 2023-09-17 ENCOUNTER — Encounter: Payer: Self-pay | Admitting: Pulmonary Disease

## 2023-09-17 ENCOUNTER — Ambulatory Visit: Attending: Pulmonary Disease | Admitting: Pulmonary Disease

## 2023-09-17 VITALS — BP 126/70 | HR 44 | Ht 68.0 in | Wt 142.0 lb

## 2023-09-17 DIAGNOSIS — I10 Essential (primary) hypertension: Secondary | ICD-10-CM

## 2023-09-17 DIAGNOSIS — Z8774 Personal history of (corrected) congenital malformations of heart and circulatory system: Secondary | ICD-10-CM | POA: Diagnosis not present

## 2023-09-17 DIAGNOSIS — R001 Bradycardia, unspecified: Secondary | ICD-10-CM

## 2023-09-17 DIAGNOSIS — I495 Sick sinus syndrome: Secondary | ICD-10-CM | POA: Diagnosis not present

## 2023-09-17 NOTE — Progress Notes (Signed)
  Electrophysiology Office Note:   Date:  09/17/2023  ID:  Brittany Hodges, DOB Dec 10, 1965, MRN [id number]  Primary Cardiologist: None Primary Heart Failure: None Electrophysiologist: Richardo Chandler, MD      History of Present Illness:   Brittany Hodges is a 58 y.o. female with h/o SND / junctional rhythm s/p ASD repair, neurally mediated syncope, AT, palpitations, HTN, Sjogren's seen today for routine electrophysiology followup.   Since last being seen in our clinic the patient reports doing very well overall.  She continues to go to to gym and works out. She is able to get her HR up to 120's with exercise.  She notes her blood pressure was elevated during with parotid gland issues / pain recently.  Her blood pressure medication was increased and her BP control is much better.  She takes supplements as well > birch in nitrate and Fulvic minerals (bitters to help with BP).  She asks about an updated ECHO with her BP issues and ASD.   She denies chest pain, palpitations, dyspnea, PND, orthopnea, nausea, vomiting, dizziness, syncope, edema, weight gain, or early satiety.   Review of systems complete and found to be negative unless listed in HPI.   EP Information / Studies Reviewed:    EKG is not ordered today. EKG from 08/25/23 reviewed which showed junctional bradycardia 44 bpm      Studies:  CT Cardiac Scoring 09/2021 > CCS 0  Cardiac Monitor 01/2022 > predominant NSR, ave 64 bpm, brief episodes of AT, rare PAC's/PVC's, no AF, pt symptoms associated with NSR   Arrhythmia / AAD Junctional Rhythm in setting of ASD repair             Physical Exam:   VS:  BP 126/70 (BP Location: Right Arm, Patient Position: Sitting, Cuff Size: Normal)   Pulse (!) 44   Ht 5\' 8"  (1.727 m)   Wt 142 lb (64.4 kg)   LMP 05/01/2016   SpO2 99%   BMI 21.59 kg/m    Wt Readings from Last 3 Encounters:  09/17/23 142 lb (64.4 kg)  08/25/23 142 lb 13.7 oz (64.8 kg)  08/20/23 143 lb (64.9 kg)     GEN:  Well nourished, well developed in no acute distress NECK: No JVD; No carotid bruits CARDIAC: Regular rate and rhythm, SEM, no rubs, gallops RESPIRATORY:  Clear to auscultation without rales, wheezing or rhonchi  ABDOMEN: Soft, non-tender, non-distended EXTREMITIES:  No edema; No deformity   ASSESSMENT AND PLAN:    Sinus Node Dysfunction  Junctional Bradycardia  Post ASD repair, prior exercise testing patient able to get HR up to 155 bpm  -EKG as above -exercise tolerance remains good   -no indication for PPM at this time, discussed that she may require one at some point down the line but her conduction system appears stable   Hx Neurally Mediated Syncope  -no further episodes, last was 10 years ago    Hypertension  -well controlled on current regimen  -update ECHO   ASD s/p Repair  -per Cardiology    Follow up with Dr. Rodolfo Clan  in August per patient request   Signed, Creighton Doffing, NP-C, AGACNP-BC [facility] - Electrophysiology  09/17/2023, 4:13 PM

## 2023-09-17 NOTE — Patient Instructions (Signed)
 Medication Instructions:  Your physician recommends that you continue on your current medications as directed. Please refer to the Current Medication list given to you today.  *If you need a refill on your cardiac medications before your next appointment, please call your pharmacy*  Lab Work: None ordered If you have labs (blood work) drawn today and your tests are completely normal, you will receive your results only by: MyChart Message (if you have MyChart) OR A paper copy in the mail If you have any lab test that is abnormal or we need to change your treatment, we will call you to review the results.  Testing/Procedures: Your physician has requested that you have an echocardiogram. Echocardiography is a painless test that uses sound waves to create images of your heart. It provides your doctor with information about the size and shape of your heart and how well your heart's chambers and valves are working. This procedure takes approximately one hour. There are no restrictions for this procedure. Please do NOT wear cologne, perfume, aftershave, or lotions (deodorant is allowed). Please arrive 15 minutes prior to your appointment time.  Please note: We ask at that you not bring children with you during ultrasound (echo/ vascular) testing. Due to room size and safety concerns, children are not allowed in the ultrasound rooms during exams. Our front office staff cannot provide observation of children in our lobby area while testing is being conducted. An adult accompanying a patient to their appointment will only be allowed in the ultrasound room at the discretion of the ultrasound technician under special circumstances. We apologize for any inconvenience.   Follow-Up: At [facility], you and your health needs are our priority.  As part of our continuing mission to provide you with exceptional heart care, our providers are all part of one team.  This team includes your primary  Cardiologist (physician) and Advanced Practice Providers or APPs (Physician Assistants and Nurse Practitioners) who all work together to provide you with the care you need, when you need it.  Your next appointment:   August 2025  Provider:   Richardo Chandler, MD

## 2023-09-20 ENCOUNTER — Telehealth: Payer: Self-pay

## 2023-09-20 NOTE — Telephone Encounter (Signed)
 Called to schedule orthotic pick up.. no VM set up

## 2023-09-24 ENCOUNTER — Ambulatory Visit: Admitting: Internal Medicine

## 2023-10-06 ENCOUNTER — Other Ambulatory Visit: Payer: Self-pay | Admitting: Internal Medicine

## 2023-10-06 ENCOUNTER — Telehealth: Payer: Self-pay

## 2023-10-06 NOTE — Telephone Encounter (Signed)
 Called pt to resched appointment on 6/20 in [place]. Pt aware new appointment is in [place]. Orthotics put in [place] box on 5/21

## 2023-10-22 ENCOUNTER — Ambulatory Visit (HOSPITAL_COMMUNITY)
Admission: RE | Admit: 2023-10-22 | Discharge: 2023-10-22 | Disposition: A | Discharge status: Home / Self Care | Source: Ambulatory Visit | Attending: Internal Medicine | Admitting: Internal Medicine

## 2023-10-22 DIAGNOSIS — I10 Essential (primary) hypertension: Secondary | ICD-10-CM | POA: Insufficient documentation

## 2023-10-22 DIAGNOSIS — Z8774 Personal history of (corrected) congenital malformations of heart and circulatory system: Secondary | ICD-10-CM

## 2023-10-22 DIAGNOSIS — I361 Nonrheumatic tricuspid (valve) insufficiency: Secondary | ICD-10-CM | POA: Diagnosis not present

## 2023-10-22 LAB — ECHOCARDIOGRAM COMPLETE
Area-P 1/2: 2.75 cm2
S' Lateral: 2.8 cm

## 2023-10-26 ENCOUNTER — Ambulatory Visit: Payer: Self-pay | Admitting: Pulmonary Disease

## 2023-11-05 ENCOUNTER — Other Ambulatory Visit

## 2023-11-12 ENCOUNTER — Ambulatory Visit

## 2023-11-12 NOTE — Progress Notes (Signed)
 Patient presents today to pick up custom molded foot orthotics, diagnosed with BIL foot deformity by Dr. Tobie.   Orthotics were dispensed and fit was satisfactory. Reviewed instructions for break-in and wear. Written instructions given to patient.  Patient will follow up as needed.   Lolita Schultze CPed, CFo, CFm

## 2023-11-14 NOTE — Progress Notes (Deleted)
 Office Visit Note  Patient: Brittany Hodges             Date of Birth: 03/04/1966           MRN: [id number]             PCP: Amon Aloysius BRAVO, MD Referring: Amon Aloysius BRAVO, MD Visit Date: 11/26/2023 Occupation: @GUAROCC @  Subjective:    History of Present Illness: Brittany Hodges is a 58 y.o. female with history of rheumatoid arthritis, iritis, and sjogren's syndrome.  Patient is prescribed  Cimzia  400 mg sq injections once monthly-in-office administered.   CBC and BMP updated on 08/25/23. Orders for CBC and CMP released today.  TB gold negative on 06/24/23    Activities of Daily Living:  Patient reports morning stiffness for *** {minute/hour:19697}.   Patient {ACTIONS;DENIES/REPORTS:21021675::Denies} nocturnal pain.  Difficulty dressing/grooming: {ACTIONS;DENIES/REPORTS:21021675::Denies} Difficulty climbing stairs: {ACTIONS;DENIES/REPORTS:21021675::Denies} Difficulty getting out of chair: {ACTIONS;DENIES/REPORTS:21021675::Denies} Difficulty using hands for taps, buttons, cutlery, and/or writing: {ACTIONS;DENIES/REPORTS:21021675::Denies}  No Rheumatology ROS completed.   PMFS History:  Patient Active Problem List   Diagnosis Date Noted   Osteopenia 01/27/2022   Bradycardia 03/06/2021   Benign paroxysmal positional vertigo 03/06/2021   Mild intermittent asthma 12/22/2020   Chronic sinusitis 09/14/2019   Intolerance, food 09/14/2019   Asthmatic bronchitis 04/06/2016   Laryngopharyngeal reflux (LPR) 04/06/2016   PCP NOTES >>>>> 03/09/2015   Annual physical exam 12/21/2014   Cardiomegaly, by MRI 03/16/2014   Leukopenia 03/10/2014   Dizziness 02/10/2011   SINOATRIAL NODE DYSFUNCTION 08/06/2008   ATRIAL SEPTAL DEFECT 06/29/2007   Hypothyroidism 03/21/2007   Essential hypertension 03/21/2007   Perennial allergic rhinitis 03/21/2007   GERD-- dysphagia 03/21/2007   CONSTIPATION 03/21/2007   IRRITABLE BOWEL SYNDROME 03/21/2007   RA (rheumatoid arthritis) (HCC)  03/21/2007   LOW BACK PAIN 03/21/2007   FIBROMYALGIA 03/21/2007    Past Medical History:  Diagnosis Date   Allergic rhinitis, cause unspecified    Anemia    Asthma    Atrial septal defect    s/p repair;   Echo 08/07/10: EF 55-60%; mild MR; atrial septal aneurysm; no residual ASD   Chest pain, unspecified    cardiac cath 07/31/10: normal coronaries; vigorous LVF   Esophageal reflux    Fibromyalgia    Headache(784.0)    Hematuria, unspecified    Hypertension    no meds    Hypothyroidism    Irritable bowel syndrome    Low back pain    Rheumatoid arthritis(714.0)    Dr Margarita   Scoliosis    Sinoatrial node dysfunction (HCC)    eval. for chronotropic competence completed in past   Sjogren's disease (HCC)     Family History  Problem Relation Age of Onset   Hypertension Mother    Diabetes Mother    Kidney failure Mother    Glaucoma Father    Hypertension Sister    Thyroid  disease Sister    Hypertension Sister    Thyroid  disease Sister    Heart disease Maternal Grandmother    Stomach cancer Maternal Grandmother 95   Rheum arthritis Maternal Grandmother    Sinusitis Daughter    Asthma Son    Eczema Son    Breast cancer Other        aunt   Diabetes Other        GM   Colon cancer Neg Hx    Allergic rhinitis Neg Hx    Angioedema Neg Hx    Immunodeficiency Neg Hx  Past Surgical History:  Procedure Laterality Date   ASD REPAIR  1985   TONSILLECTOMY AND ADENOIDECTOMY     TUBAL LIGATION     Social History   Social History Narrative   Household-- lives by herself   Immunization History  Administered Date(s) Administered   Dtap, Unspecified 11/05/1969, 01/08/1970, 07/01/1978   Influenza Split 03/17/2011, 03/17/2012, 02/16/2023   Influenza Whole 04/05/2009, 02/21/2010   Influenza, Seasonal, Injecte, Preservative Fre 03/11/2021   Influenza,inj,Quad PF,6+ Mos 03/21/2013, 03/09/2014, 05/24/2015, 01/27/2016   Influenza-Unspecified 07/30/2020, 02/17/2022   Measles  10/20/1976   PFIZER(Purple Top)SARS-COV-2 Vaccination 12/16/2019, 01/16/2020   Pneumococcal Conjugate-13 01/27/2016   Pneumococcal Polysaccharide-23 03/08/2015   Polio, Unspecified 12/08/1969, 07/01/1978, 08/09/1978   Rubella 10/20/1976   Smallpox 01/08/1970   Td 07/23/2010   Tdap 05/28/2020, 02/25/2022     Objective: Vital Signs: LMP 05/01/2016    Physical Exam Vitals and nursing note reviewed.  Constitutional:      Appearance: She is well-developed.  HENT:     Head: Normocephalic and atraumatic.  Eyes:     Conjunctiva/sclera: Conjunctivae normal.  Cardiovascular:     Rate and Rhythm: Normal rate and regular rhythm.     Heart sounds: Normal heart sounds.  Pulmonary:     Effort: Pulmonary effort is normal.     Breath sounds: Normal breath sounds.  Abdominal:     General: Bowel sounds are normal.     Palpations: Abdomen is soft.  Musculoskeletal:     Cervical back: Normal range of motion.  Lymphadenopathy:     Cervical: No cervical adenopathy.  Skin:    General: Skin is warm and dry.     Capillary Refill: Capillary refill takes less than 2 seconds.  Neurological:     Mental Status: She is alert and oriented to person, place, and time.  Psychiatric:        Behavior: Behavior normal.      Musculoskeletal Exam: ***  CDAI Exam: CDAI Score: -- Patient Global: --; Provider Global: -- Swollen: --; Tender: -- Joint Exam 11/26/2023   No joint exam has been documented for this visit   There is currently no information documented on the homunculus. Go to the Rheumatology activity and complete the homunculus joint exam.  Investigation: No additional findings.  Imaging: ECHOCARDIOGRAM COMPLETE Result Date: 10/22/2023    ECHOCARDIOGRAM REPORT   Patient Name:   Brittany Hodges Date of Exam: 10/22/2023 Medical Rec #:  [id number]           Height:       68.0 in Accession #:    [id number]          Weight:       142.0 lb Date of Birth:  [birth year]           BSA:           1.767 m Patient Age:    57 years            BP:           126/70 mmHg Patient Gender: F                   HR:           44 bpm. Exam Location:  [address] Procedure: 2D Echo, Cardiac Doppler, Color Doppler, 3D Echo and Strain Analysis            (Both Spectral and Color Flow Doppler were utilized during  procedure). Indications:    Z87.74 H/o ASD repair  History:        Patient has prior history of Echocardiogram examinations, most                 recent 03/20/2014. H/o ASD repair; Risk Factors:Hypertension.  Sonographer:    Elsie Bohr RDCS Referring Phys: [id number] DAPHNE CROME OLLIS IMPRESSIONS  1. Left ventricular ejection fraction, by estimation, is 55 to 60%. Left ventricular ejection fraction by 3D volume is 58 %. The left ventricle has normal function. The left ventricle has no regional wall motion abnormalities. Left ventricular diastolic  parameters were normal. The average left ventricular global longitudinal strain is -23.1 %. The global longitudinal strain is normal.  2. Right ventricular systolic function is normal. The right ventricular size is normal.  3. Interatrial septum is aneurysmal. No evidence of residual shunting on color Doppler.  4. Right atrial size was mild to moderately dilated.  5. The mitral valve is normal in structure. Trivial mitral valve regurgitation. No evidence of mitral stenosis.  6. Tricuspid valve regurgitation is mild to moderate.  7. The aortic valve is normal in structure. Aortic valve regurgitation is not visualized. No aortic stenosis is present.  8. The inferior vena cava is normal in size with greater than 50% respiratory variability, suggesting right atrial pressure of 3 mmHg. FINDINGS  Left Ventricle: Left ventricular ejection fraction, by estimation, is 55 to 60%. Left ventricular ejection fraction by 3D volume is 58 %. The left ventricle has normal function. The left ventricle has no regional wall motion abnormalities. The average left ventricular global  longitudinal strain is -23.1 %. Strain was performed and the global longitudinal strain is normal. The left ventricular internal cavity size was normal in size. There is no left ventricular hypertrophy. Left ventricular diastolic parameters were normal. Right Ventricle: The right ventricular size is normal. No increase in right ventricular wall thickness. Right ventricular systolic function is normal. Left Atrium: Left atrial size was normal in size. Right Atrium: Right atrial size was mild to moderately dilated. Pericardium: There is no evidence of pericardial effusion. Mitral Valve: The mitral valve is normal in structure. Trivial mitral valve regurgitation. No evidence of mitral valve stenosis. Tricuspid Valve: The tricuspid valve is normal in structure. Tricuspid valve regurgitation is mild to moderate. No evidence of tricuspid stenosis. Aortic Valve: The aortic valve is normal in structure. Aortic valve regurgitation is not visualized. No aortic stenosis is present. Pulmonic Valve: The pulmonic valve was normal in structure. Pulmonic valve regurgitation is trivial. No evidence of pulmonic stenosis. Aorta: The aortic root is normal in size and structure. Venous: The inferior vena cava is normal in size with greater than 50% respiratory variability, suggesting right atrial pressure of 3 mmHg. IAS/Shunts: The interatrial septum is aneurysmal. No atrial level shunt detected by color flow Doppler. Additional Comments: 3D was performed not requiring image post processing on an independent workstation and was normal.  LEFT VENTRICLE PLAX 2D LVIDd:         4.80 cm         Diastology LVIDs:         2.80 cm         LV e' medial:    12.70 cm/s LV PW:         0.90 cm         LV E/e' medial:  6.8 LV IVS:        1.00 cm  LV e' lateral:   19.30 cm/s LVOT diam:     2.00 cm         LV E/e' lateral: 4.5 LV SV:         77 LV SV Index:   44              2D Longitudinal LVOT Area:     3.14 cm        Strain                                 2D Strain GLS   -23.0 %                                (A4C):                                2D Strain GLS   -21.8 %                                (A3C):                                2D Strain GLS   -24.5 %                                (A2C):                                2D Strain GLS   -23.1 %                                Avg:                                 3D Volume EF                                LV 3D EF:    Left                                             ventricul                                             ar                                             ejection  fraction                                             by 3D                                             volume is                                             58 %.                                 3D Volume EF:                                3D EF:        58 %                                LV EDV:       130 ml                                LV ESV:       54 ml                                LV SV:        75 ml RIGHT VENTRICLE             IVC RV S prime:     11.30 cm/s  IVC diam: 0.90 cm TAPSE (M-mode): 1.8 cm RVSP:           27.8 mmHg LEFT ATRIUM           Index        RIGHT ATRIUM           Index LA diam:      3.70 cm 2.09 cm/m   RA Pressure: 3.00 mmHg LA Vol (A2C): 35.5 ml 20.09 ml/m  RA Area:     15.20 cm LA Vol (A4C): 40.4 ml 22.86 ml/m  RA Volume:   40.30 ml  22.81 ml/m  AORTIC VALVE LVOT Vmax:   113.00 cm/s LVOT Vmean:  74.700 cm/s LVOT VTI:    0.245 m  AORTA Ao Root diam: 3.10 cm Ao Asc diam:  3.60 cm MITRAL VALVE               TRICUSPID VALVE MV Area (PHT): 2.75 cm    TR Peak grad:   24.8 mmHg MV Decel Time: 276 msec    TR Vmax:        249.00 cm/s MV E velocity: 86.50 cm/s  Estimated RAP:  3.00 mmHg MV A velocity: 60.30 cm/s  RVSP:           27.8 mmHg MV E/A ratio:  1.43  SHUNTS                            Systemic VTI:  0.24 m                             Systemic Diam: 2.00 cm Toribio Fuel MD Electronically signed by Toribio Fuel MD Signature Date/Time: 10/22/2023/3:16:33 PM    Final     Recent Labs: Lab Results  Component Value Date   WBC 7.2 08/25/2023   HGB 13.0 08/25/2023   PLT 196 08/25/2023   NA 137 08/25/2023   K 4.1 08/25/2023   CL 102 08/25/2023   CO2 26 08/25/2023   GLUCOSE 108 (H) 08/25/2023   BUN 13 08/25/2023   CREATININE 0.79 08/25/2023   BILITOT 0.4 06/24/2023   ALKPHOS 54 10/04/2022   AST 32 06/24/2023   ALT 29 06/24/2023   PROT 7.8 06/24/2023   ALBUMIN 4.0 10/04/2022   CALCIUM 9.9 08/25/2023   GFRAA 89 02/15/2020   QFTBGOLDPLUS NEGATIVE 06/24/2023    Speciality Comments: Dexa- 05/27/18 Osteopenia T-Score -1.7 BMD 0.963  Prior therapy includes: Plaquenil (inadequate response) , Enbrel (injection site reaction) and methotrexate  (neutropenia so on low dose).  Procedures:  No procedures performed Allergies: Etanercept, Methotrexate  derivatives, and Pregabalin   Assessment / Plan:     Visit Diagnoses: Rheumatoid arthritis involving multiple sites with positive rheumatoid factor (HCC)  High risk medication use  Iritis  Sjogren's syndrome with other organ involvement (HCC)  Other neutropenia (HCC)  Primary osteoarthritis of both hands  Primary osteoarthritis of both feet  DDD (degenerative disc disease), cervical  Trochanteric bursitis, right hip  Osteopenia of multiple sites  Arthropathy of lumbar facet joint  History of scoliosis  History of migraine  History of gastroesophageal reflux (GERD)  History of IBS  History of hypothyroidism  History of hypertension  Orders: No orders of the defined types were placed in this encounter.  No orders of the defined types were placed in this encounter.   Face-to-face time spent with patient was *** minutes. Greater than 50% of time was spent in counseling and coordination of care.  Follow-Up Instructions: No follow-ups on  file.   Waddell CHRISTELLA Craze, PA-C  Note - This record has been created using Dragon software.  Chart creation errors have been sought, but may not always  have been located. Such creation errors do not reflect on  the standard of medical care.

## 2023-11-25 NOTE — Progress Notes (Unsigned)
 Office Visit Note  Patient: Brittany Hodges             Date of Birth: 1966-04-24           MRN: [id number]             PCP: Amon Aloysius BRAVO, MD Referring: Amon Aloysius BRAVO, MD Visit Date: 11/29/2023 Occupation: @GUAROCC @  Subjective:  Medication monitoring   History of Present Illness: Brittany Hodges is a 58 y.o. female with history of rheumatoid arthritis, iritis, and sjogren's syndrome.  Patient is prescribed Cimzia  400 mg sq injections once monthly-in-office administered.  Her last dose of Cimzia  was administered on 08/19/2023.  Patient states that she has had a gap in therapy due to the cost of Cimzia .  She denies any recent iritis flares but has continued to follow-up with ophthalmology closely.  Patient states that her primary concern has been chronic eye dryness.  Patient will be starting on Cequa drops.  Patient states that overall her mouth dryness has been tolerable.  She does currently have a salivary stone and plans to continue to massage which has been helpful with passing the stone in the past. Patient presents today with increased pain and inflammation in the right great toe.  Patient states that her symptoms started several days ago.  She has also had intermittent discomfort in the right knee.  She denies any right knee swelling at this time.  She denies any recent injury or fall.  She has continued to walk 2 miles on a daily basis for exercise.  She takes Tylenol  as needed for pain relief. She denies any recent or recurrent infections.     Activities of Daily Living:  Patient reports morning stiffness for 0 minutes.   Patient Reports nocturnal pain.  Difficulty dressing/grooming: Denies Difficulty climbing stairs: Denies Difficulty getting out of chair: Denies Difficulty using hands for taps, buttons, cutlery, and/or writing: Denies  Review of Systems  Constitutional:  Negative for fatigue.  HENT:  Negative for mouth sores and mouth dryness.   Eyes:  Positive for  dryness.  Respiratory:  Negative for shortness of breath.   Cardiovascular:  Negative for chest pain and palpitations.  Gastrointestinal:  Negative for blood in stool, constipation and diarrhea.  Endocrine: Negative for increased urination.  Genitourinary:  Negative for involuntary urination.  Musculoskeletal:  Positive for joint pain, joint pain and joint swelling. Negative for gait problem, myalgias, muscle weakness, morning stiffness, muscle tenderness and myalgias.  Skin:  Negative for color change, rash, hair loss and sensitivity to sunlight.  Allergic/Immunologic: Negative for susceptible to infections.  Neurological:  Negative for dizziness and headaches.  Hematological:  Positive for swollen glands.  Psychiatric/Behavioral:  Negative for depressed mood and sleep disturbance. The patient is not nervous/anxious.     PMFS History:  Patient Active Problem List   Diagnosis Date Noted   Osteopenia 01/27/2022   Bradycardia 03/06/2021   Benign paroxysmal positional vertigo 03/06/2021   Mild intermittent asthma 12/22/2020   Chronic sinusitis 09/14/2019   Intolerance, food 09/14/2019   Asthmatic bronchitis 04/06/2016   Laryngopharyngeal reflux (LPR) 04/06/2016   PCP NOTES >>>>> 03/09/2015   Annual physical exam 12/21/2014   Cardiomegaly, by MRI 03/16/2014   Leukopenia 03/10/2014   Dizziness 02/10/2011   SINOATRIAL NODE DYSFUNCTION 08/06/2008   ATRIAL SEPTAL DEFECT 06/29/2007   Hypothyroidism 03/21/2007   Essential hypertension 03/21/2007   Perennial allergic rhinitis 03/21/2007   GERD-- dysphagia 03/21/2007   CONSTIPATION 03/21/2007   IRRITABLE BOWEL  SYNDROME 03/21/2007   RA (rheumatoid arthritis) (HCC) 03/21/2007   LOW BACK PAIN 03/21/2007   FIBROMYALGIA 03/21/2007    Past Medical History:  Diagnosis Date   Allergic rhinitis, cause unspecified    Anemia    Asthma    Atrial septal defect    s/p repair;   Echo 08/07/10: EF 55-60%; mild MR; atrial septal aneurysm; no  residual ASD   Chest pain, unspecified    cardiac cath 07/31/10: normal coronaries; vigorous LVF   Esophageal reflux    Fibromyalgia    Headache(784.0)    Hematuria, unspecified    Hypertension    no meds    Hypothyroidism    Irritable bowel syndrome    Low back pain    Rheumatoid arthritis(714.0)    Dr Margarita   Scoliosis    Sinoatrial node dysfunction (HCC)    eval. for chronotropic competence completed in past   Sjogren's disease (HCC)     Family History  Problem Relation Age of Onset   Hypertension Mother    Diabetes Mother    Kidney failure Mother    Glaucoma Father    Hypertension Sister    Thyroid  disease Sister    Hypertension Sister    Thyroid  disease Sister    Heart disease Maternal Grandmother    Stomach cancer Maternal Grandmother 95   Rheum arthritis Maternal Grandmother    Sinusitis Daughter    Asthma Son    Eczema Son    Breast cancer Other        aunt   Diabetes Other        GM   Colon cancer Neg Hx    Allergic rhinitis Neg Hx    Angioedema Neg Hx    Immunodeficiency Neg Hx    Past Surgical History:  Procedure Laterality Date   ASD REPAIR  1985   TONSILLECTOMY AND ADENOIDECTOMY     TUBAL LIGATION     Social History   Social History Narrative   Household-- lives by herself   Immunization History  Administered Date(s) Administered   Dtap, Unspecified 11/05/1969, 01/08/1970, 07/01/1978   Influenza Split 03/17/2011, 03/17/2012, 02/16/2023   Influenza Whole 04/05/2009, 02/21/2010   Influenza, Seasonal, Injecte, Preservative Fre 03/11/2021   Influenza,inj,Quad PF,6+ Mos 03/21/2013, 03/09/2014, 05/24/2015, 01/27/2016   Influenza-Unspecified 07/30/2020, 02/17/2022   Measles 10/20/1976   PFIZER(Purple Top)SARS-COV-2 Vaccination 12/16/2019, 01/16/2020   Pneumococcal Conjugate-13 01/27/2016   Pneumococcal Polysaccharide-23 03/08/2015   Polio, Unspecified 12/08/1969, 07/01/1978, 08/09/1978   Rubella 10/20/1976   Smallpox 01/08/1970   Td  07/23/2010   Tdap 05/28/2020, 02/25/2022     Objective: Vital Signs: BP 115/71 (BP Location: Left Arm, Patient Position: Sitting, Cuff Size: Small)   Pulse (!) 54   Resp 12   Ht 5' 9 (1.753 m)   Wt 139 lb 6.4 oz (63.2 kg)   LMP 05/01/2016   BMI 20.59 kg/m    Physical Exam Vitals and nursing note reviewed.  Constitutional:      Appearance: She is well-developed.  HENT:     Head: Normocephalic and atraumatic.  Eyes:     Conjunctiva/sclera: Conjunctivae normal.  Cardiovascular:     Rate and Rhythm: Normal rate and regular rhythm.     Heart sounds: Normal heart sounds.  Pulmonary:     Effort: Pulmonary effort is normal.     Breath sounds: Normal breath sounds.  Abdominal:     General: Bowel sounds are normal.     Palpations: Abdomen is soft.  Musculoskeletal:  Cervical back: Normal range of motion.  Lymphadenopathy:     Cervical: No cervical adenopathy.  Skin:    General: Skin is warm and dry.     Capillary Refill: Capillary refill takes less than 2 seconds.  Neurological:     Mental Status: She is alert and oriented to person, place, and time.  Psychiatric:        Behavior: Behavior normal.      Musculoskeletal Exam: C-spine, thoracic spine, lumbar spine and good range of motion.  Shoulder joints, elbow joints, wrist joints MCPs, PIPs, DIPs have good range of motion with no synovitis.  Complete fist formation bilaterally.  Hip joints have good range of motion with no groin pain.  Knee joints have good range of motion no warmth or effusion.  Ankle joints have good range of motion with no tenderness or joint swelling.  Some tenderness and mild inflammation noted in the right first PIP joint.  Some tenderness of the right first MTP joint.  CDAI Exam: CDAI Score: -- Patient Global: --; Provider Global: -- Swollen: --; Tender: -- Joint Exam 11/29/2023   No joint exam has been documented for this visit   There is currently no information documented on the homunculus.  Go to the Rheumatology activity and complete the homunculus joint exam.  Investigation: No additional findings.  Imaging: No results found.   Recent Labs: Lab Results  Component Value Date   WBC 7.2 08/25/2023   HGB 13.0 08/25/2023   PLT 196 08/25/2023   NA 137 08/25/2023   K 4.1 08/25/2023   CL 102 08/25/2023   CO2 26 08/25/2023   GLUCOSE 108 (H) 08/25/2023   BUN 13 08/25/2023   CREATININE 0.79 08/25/2023   BILITOT 0.4 06/24/2023   ALKPHOS 54 10/04/2022   AST 32 06/24/2023   ALT 29 06/24/2023   PROT 7.8 06/24/2023   ALBUMIN 4.0 10/04/2022   CALCIUM 9.9 08/25/2023   GFRAA 89 02/15/2020   QFTBGOLDPLUS NEGATIVE 06/24/2023    Speciality Comments: Dexa- 05/27/18 Osteopenia T-Score -1.7 BMD 0.963  Prior therapy includes: Plaquenil (inadequate response) , Enbrel (injection site reaction) and methotrexate  (neutropenia so on low dose).  Procedures:  No procedures performed Allergies: Etanercept, Methotrexate  derivatives, and Pregabalin     Assessment / Plan:     Visit Diagnoses: Rheumatoid arthritis involving multiple sites with positive rheumatoid factor (HCC) -She presents today with increased pain and inflammation in the right great toe for the past few days.  No recent injury or fall.  On examination she has tenderness over the right first MTP and PIP joint with mild inflammation in the PIP joint.  Mild thickening and subluxation was also noted in the right first PIP joint.  She experiences occasional discomfort in the right knee.  She has been wearing knee braces while exercising.  She has been trying to walk 2 miles on a daily basis.  Her last dose of Cimzia  was administered on 08/19/2023.  She is tolerating Cimzia  without any side effects or injection site reactions.  She has had a gap in therapy due to the cost of Cimzia .  She will be eligible to have an updated Cimzia  dose pending lab results today.  She will notify us  if her symptoms persist or worsen.  Offered a prednisone   taper but she has declined at this time.  She will follow-up in the office in 3 months or sooner if needed.  Plan: Serum protein electrophoresis with reflex, Rheumatoid factor  High risk medication use -  Cimzia  400 mg sq injections once monthly-in-office administered.  CBC and BMP updated on 08/25/23. Orders for CBC and CMP released today.  Her next lab work will be due in October and every 3 months to monitor for toxicity.  Standing orders for CBC and CMP remain in place. TB gold negative on 06/24/23 No recent or recurrent infections.  Discussed the importance of holding cimzia  if she develops signs or symptoms of an infection and to resume once the infection has completely cleared. - Plan: CBC with Differential/Platelet, Comprehensive metabolic panel with GFR  Iritis: Under care of Dr. Cleotilde.  No recent iritis flares.  Her primary concern has been chronic eye dryness.  She will be initiating Cequa drops twice daily.   Sjogren's syndrome with other organ involvement (HCC) - Ro>8. La-: Evaluated by ENT--diffuse bilateral parotid sialolithiasis-CT 10/04/22-advanced chronic bilateral parotid gland sialolithiasis: She continues to have chronic sicca symptoms especially chronic eye dryness.  She remains under the care of Dr. Cleotilde and will be initiating cequa drops twice daily. Her mouth dryness has been tolerable.  She currently has a salivary stone--conservative treatment options were discussed. Discussed the increased risk for developing lymphoma in patients with Sjogren's syndrome.  SPEP unremarkable on 12/29/2022. Plan to obtain the following lab work today for further evaluation.  She will notify us  if she develops any new or worsening symptoms.  - Plan: CBC with Differential/Platelet, Comprehensive metabolic panel with GFR, Serum protein electrophoresis with reflex, C3 and C4, Sedimentation rate, Urinalysis, Routine w reflex microscopic, ANA, Sjogrens syndrome-A extractable nuclear antibody, Sjogrens  syndrome-B extractable nuclear antibody, Rheumatoid factor  Other neutropenia (HCC) -White blood cell count was within normal limits: 7.2 on 08/25/2023.  Order for CBC with differential released today.  Plan: CBC with Differential/Platelet  Primary osteoarthritis of both hands: No tenderness or synovitis noted.  Complete fist formation bilaterally.  Discussed the importance of joint protection and muscle strengthening.  Primary osteoarthritis of both feet: She presents today with right great toe pain for the past few days.  No recent injury or fall.  On examination she has tenderness over the right first MTP joint and tenderness and mild inflammation of the right first PIP joint.  Different treatment options were discussed including proceeding with an ultrasound-guided cortisone injection versus a prednisone  taper.  She would like to hold off on any steroids at this time and will try Voltaren  gel topically.  Plan to schedule Cimzia  pending lab results today.  She will notify us  if her symptoms persist or worsen.  DDD (degenerative disc disease), cervical: C-spine has good range of motion on examination today.  No symptoms of radiculopathy.  Trochanteric bursitis, right hip: Not currently symptomatic.  Osteopenia of multiple sites - DEXA updated on 10/28/2020 AP spine BMD 0.921 with T-score -1.1. -4% change in BMD. Due to update DEXA. Order for DEXA placed today.   Arthropathy of lumbar facet joint: Not currently symptomatic.  No symptoms of radiculopathy.  Other medical conditions are listed as follows:  History of scoliosis  History of migraine  History of gastroesophageal reflux (GERD)  History of IBS  History of hypothyroidism  History of hypertension: Blood pressure was 115/71 today in the office.  Orders: Orders Placed This Encounter  Procedures   DG BONE DENSITY (DXA)   CBC with Differential/Platelet   Comprehensive metabolic panel with GFR   Serum protein electrophoresis with  reflex   C3 and C4   Sedimentation rate   Urinalysis, Routine w reflex microscopic  ANA   Sjogrens syndrome-A extractable nuclear antibody   Sjogrens syndrome-B extractable nuclear antibody   Rheumatoid factor   No orders of the defined types were placed in this encounter.   Follow-Up Instructions: Return in about 3 months (around 02/29/2024) for Rheumatoid arthritis.   Waddell CHRISTELLA Craze, PA-C  Note - This record has been created using Dragon software.  Chart creation errors have been sought, but may not always  have been located. Such creation errors do not reflect on  the standard of medical care.

## 2023-11-26 ENCOUNTER — Ambulatory Visit: Admitting: Physician Assistant

## 2023-11-26 DIAGNOSIS — Z79899 Other long term (current) drug therapy: Secondary | ICD-10-CM

## 2023-11-26 DIAGNOSIS — Z8679 Personal history of other diseases of the circulatory system: Secondary | ICD-10-CM

## 2023-11-26 DIAGNOSIS — Z8719 Personal history of other diseases of the digestive system: Secondary | ICD-10-CM

## 2023-11-26 DIAGNOSIS — M8589 Other specified disorders of bone density and structure, multiple sites: Secondary | ICD-10-CM

## 2023-11-26 DIAGNOSIS — H209 Unspecified iridocyclitis: Secondary | ICD-10-CM

## 2023-11-26 DIAGNOSIS — M47816 Spondylosis without myelopathy or radiculopathy, lumbar region: Secondary | ICD-10-CM

## 2023-11-26 DIAGNOSIS — Z8639 Personal history of other endocrine, nutritional and metabolic disease: Secondary | ICD-10-CM

## 2023-11-26 DIAGNOSIS — M3509 Sicca syndrome with other organ involvement: Secondary | ICD-10-CM

## 2023-11-26 DIAGNOSIS — Z8739 Personal history of other diseases of the musculoskeletal system and connective tissue: Secondary | ICD-10-CM

## 2023-11-26 DIAGNOSIS — M0579 Rheumatoid arthritis with rheumatoid factor of multiple sites without organ or systems involvement: Secondary | ICD-10-CM

## 2023-11-26 DIAGNOSIS — Z8669 Personal history of other diseases of the nervous system and sense organs: Secondary | ICD-10-CM

## 2023-11-26 DIAGNOSIS — M5030 Other cervical disc degeneration, unspecified cervical region: Secondary | ICD-10-CM

## 2023-11-26 DIAGNOSIS — M7061 Trochanteric bursitis, right hip: Secondary | ICD-10-CM

## 2023-11-26 DIAGNOSIS — M19071 Primary osteoarthritis, right ankle and foot: Secondary | ICD-10-CM

## 2023-11-26 DIAGNOSIS — M19041 Primary osteoarthritis, right hand: Secondary | ICD-10-CM

## 2023-11-26 DIAGNOSIS — D708 Other neutropenia: Secondary | ICD-10-CM

## 2023-11-29 ENCOUNTER — Telehealth: Payer: Self-pay | Admitting: Pharmacist

## 2023-11-29 ENCOUNTER — Ambulatory Visit: Attending: Physician Assistant | Admitting: Physician Assistant

## 2023-11-29 ENCOUNTER — Encounter: Payer: Self-pay | Admitting: Physician Assistant

## 2023-11-29 VITALS — BP 115/71 | HR 54 | Resp 12 | Ht 69.0 in | Wt 139.4 lb

## 2023-11-29 DIAGNOSIS — M19042 Primary osteoarthritis, left hand: Secondary | ICD-10-CM

## 2023-11-29 DIAGNOSIS — M47816 Spondylosis without myelopathy or radiculopathy, lumbar region: Secondary | ICD-10-CM

## 2023-11-29 DIAGNOSIS — Z79899 Other long term (current) drug therapy: Secondary | ICD-10-CM | POA: Diagnosis not present

## 2023-11-29 DIAGNOSIS — H209 Unspecified iridocyclitis: Secondary | ICD-10-CM | POA: Diagnosis not present

## 2023-11-29 DIAGNOSIS — M19072 Primary osteoarthritis, left ankle and foot: Secondary | ICD-10-CM

## 2023-11-29 DIAGNOSIS — M19071 Primary osteoarthritis, right ankle and foot: Secondary | ICD-10-CM

## 2023-11-29 DIAGNOSIS — M0579 Rheumatoid arthritis with rheumatoid factor of multiple sites without organ or systems involvement: Secondary | ICD-10-CM

## 2023-11-29 DIAGNOSIS — D708 Other neutropenia: Secondary | ICD-10-CM

## 2023-11-29 DIAGNOSIS — M7061 Trochanteric bursitis, right hip: Secondary | ICD-10-CM

## 2023-11-29 DIAGNOSIS — M3509 Sicca syndrome with other organ involvement: Secondary | ICD-10-CM | POA: Diagnosis not present

## 2023-11-29 DIAGNOSIS — Z8739 Personal history of other diseases of the musculoskeletal system and connective tissue: Secondary | ICD-10-CM

## 2023-11-29 DIAGNOSIS — M5030 Other cervical disc degeneration, unspecified cervical region: Secondary | ICD-10-CM

## 2023-11-29 DIAGNOSIS — Z8639 Personal history of other endocrine, nutritional and metabolic disease: Secondary | ICD-10-CM

## 2023-11-29 DIAGNOSIS — M19041 Primary osteoarthritis, right hand: Secondary | ICD-10-CM

## 2023-11-29 DIAGNOSIS — Z8719 Personal history of other diseases of the digestive system: Secondary | ICD-10-CM

## 2023-11-29 DIAGNOSIS — Z8679 Personal history of other diseases of the circulatory system: Secondary | ICD-10-CM

## 2023-11-29 DIAGNOSIS — M8589 Other specified disorders of bone density and structure, multiple sites: Secondary | ICD-10-CM

## 2023-11-29 DIAGNOSIS — Z8669 Personal history of other diseases of the nervous system and sense organs: Secondary | ICD-10-CM

## 2023-11-29 NOTE — Patient Instructions (Signed)
 Standing Labs We placed an order today for your standing lab work.   Please have your standing labs drawn in October and every 3 months   Please have your labs drawn 2 weeks prior to your appointment so that the provider can discuss your lab results at your appointment, if possible.  Please note that you may see your imaging and lab results in MyChart before we have reviewed them. We will contact you once all results are reviewed. Please allow our office up to 72 hours to thoroughly review all of the results before contacting the office for clarification of your results.  WALK-IN LAB HOURS  Monday through Thursday from 8:00 am -12:30 pm and 1:00 pm-4:30 pm and Friday from 8:00 am-12:00 pm.  Patients with office visits requiring labs will be seen before walk-in labs.  You may encounter longer than normal wait times. Please allow additional time. Wait times may be shorter on  Monday and Thursday afternoons.  We do not book appointments for walk-in labs. We appreciate your patience and understanding with our staff.   Labs are drawn by [facility]. Please bring your co-pay at the time of your lab draw.  You may receive a bill from [facility] for your lab work.  Please note if you are on Hydroxychloroquine and and an order has been placed for a Hydroxychloroquine level,  you will need to have it drawn 4 hours or more after your last dose.  If you wish to have your labs drawn at another location, please call the office 24 hours in advance so we can fax the orders.  The office is located at [address], [place], [place] [address]   If you have any questions regarding directions or hours of operation,  please call [phone].   As a reminder, please drink plenty of water  prior to coming for your lab work. Thanks!

## 2023-11-29 NOTE — Telephone Encounter (Signed)
 Pending baseline labs from today, patient is restarting in-office Cimzia . Will not reload due to history of neutropenia  Sherry Pennant, PharmD, MPH, BCPS, CPP Clinical Pharmacist (Rheumatology and Pulmonology)

## 2023-11-30 ENCOUNTER — Ambulatory Visit: Payer: Self-pay | Admitting: Physician Assistant

## 2023-11-30 ENCOUNTER — Other Ambulatory Visit: Payer: Self-pay | Admitting: *Deleted

## 2023-11-30 DIAGNOSIS — Z79899 Other long term (current) drug therapy: Secondary | ICD-10-CM

## 2023-11-30 DIAGNOSIS — M0579 Rheumatoid arthritis with rheumatoid factor of multiple sites without organ or systems involvement: Secondary | ICD-10-CM

## 2023-11-30 DIAGNOSIS — M3509 Sicca syndrome with other organ involvement: Secondary | ICD-10-CM

## 2023-11-30 DIAGNOSIS — H209 Unspecified iridocyclitis: Secondary | ICD-10-CM

## 2023-11-30 DIAGNOSIS — D708 Other neutropenia: Secondary | ICD-10-CM

## 2023-11-30 NOTE — Progress Notes (Signed)
 WBC count is low-3.2. absolute neutrophils are low. Rest of CBC WNL.   Total protein is borderline elevated-8.2.  rest of CMP WNL. Ok to schedule cimzia .   Recommend rechecking CBC with diff 1 month after cimzia  injection.    UA normal.  ESR remains elevated.  RF remains positive.

## 2023-12-01 LAB — ANTI-NUCLEAR AB-TITER (ANA TITER)
ANA TITER: 1:80 {titer} — ABNORMAL HIGH
ANA Titer 1: 1:320 {titer} — ABNORMAL HIGH

## 2023-12-01 LAB — CBC WITH DIFFERENTIAL/PLATELET
Absolute Lymphocytes: 1238 {cells}/uL (ref 850–3900)
Absolute Monocytes: 438 {cells}/uL (ref 200–950)
Basophils Absolute: 19 {cells}/uL (ref 0–200)
Basophils Relative: 0.6 %
Eosinophils Absolute: 51 {cells}/uL (ref 15–500)
Eosinophils Relative: 1.6 %
HCT: 39.2 % (ref 35.0–45.0)
Hemoglobin: 12.1 g/dL (ref 11.7–15.5)
MCH: 28.2 pg (ref 27.0–33.0)
MCHC: 30.9 g/dL — ABNORMAL LOW (ref 32.0–36.0)
MCV: 91.4 fL (ref 80.0–100.0)
MPV: 12.1 fL (ref 7.5–12.5)
Monocytes Relative: 13.7 %
Neutro Abs: 1453 {cells}/uL — ABNORMAL LOW (ref 1500–7800)
Neutrophils Relative %: 45.4 %
Platelets: 158 Thousand/uL (ref 140–400)
RBC: 4.29 Million/uL (ref 3.80–5.10)
RDW: 11.8 % (ref 11.0–15.0)
Total Lymphocyte: 38.7 %
WBC: 3.2 Thousand/uL — ABNORMAL LOW (ref 3.8–10.8)

## 2023-12-01 LAB — C3 AND C4
C3 Complement: 98 mg/dL (ref 83–193)
C4 Complement: 14 mg/dL — ABNORMAL LOW (ref 15–57)

## 2023-12-01 LAB — COMPREHENSIVE METABOLIC PANEL WITH GFR
AG Ratio: 1.2 (calc) (ref 1.0–2.5)
ALT: 12 U/L (ref 6–29)
AST: 22 U/L (ref 10–35)
Albumin: 4.5 g/dL (ref 3.6–5.1)
Alkaline phosphatase (APISO): 87 U/L (ref 37–153)
BUN: 12 mg/dL (ref 7–25)
CO2: 27 mmol/L (ref 20–32)
Calcium: 10.2 mg/dL (ref 8.6–10.4)
Chloride: 106 mmol/L (ref 98–110)
Creat: 0.78 mg/dL (ref 0.50–1.05)
Globulin: 3.7 g/dL (ref 1.9–3.7)
Glucose, Bld: 83 mg/dL (ref 65–99)
Potassium: 4 mmol/L (ref 3.5–5.3)
Sodium: 141 mmol/L (ref 135–146)
Total Bilirubin: 0.5 mg/dL (ref 0.2–1.2)
Total Protein: 8.2 g/dL — ABNORMAL HIGH (ref 6.1–8.1)
eGFR: 87 mL/min/1.73m2 (ref >=60)

## 2023-12-01 LAB — URINALYSIS, ROUTINE W REFLEX MICROSCOPIC
Bilirubin Urine: NEGATIVE
Glucose, UA: NEGATIVE
Hgb urine dipstick: NEGATIVE
Ketones, ur: NEGATIVE
Leukocytes,Ua: NEGATIVE
Nitrite: NEGATIVE
Protein, ur: NEGATIVE
Specific Gravity, Urine: 1.011 (ref 1.001–1.035)
pH: 6.5 (ref 5.0–8.0)

## 2023-12-01 LAB — PROTEIN ELECTROPHORESIS, SERUM, WITH REFLEX
Albumin ELP: 4.3 g/dL (ref 3.8–4.8)
Alpha 1: 0.3 g/dL (ref 0.2–0.3)
Alpha 2: 0.6 g/dL (ref 0.5–0.9)
Beta 2: 0.5 g/dL (ref 0.2–0.5)
Beta Globulin: 0.5 g/dL (ref 0.4–0.6)
Gamma Globulin: 2.1 g/dL — ABNORMAL HIGH (ref 0.8–1.7)
Total Protein: 8.2 g/dL — ABNORMAL HIGH (ref 6.1–8.1)

## 2023-12-01 LAB — SJOGRENS SYNDROME-B EXTRACTABLE NUCLEAR ANTIBODY: SSB (La) (ENA) Antibody, IgG: <1 AI

## 2023-12-01 LAB — RHEUMATOID FACTOR: Rheumatoid fact SerPl-aCnc: 120 [IU]/mL — ABNORMAL HIGH (ref <14)

## 2023-12-01 LAB — ANA: Anti Nuclear Antibody (ANA): POSITIVE — AB

## 2023-12-01 LAB — SJOGRENS SYNDROME-A EXTRACTABLE NUCLEAR ANTIBODY: SSA (Ro) (ENA) Antibody, IgG: >8 AI — AB

## 2023-12-01 LAB — SEDIMENTATION RATE: Sed Rate: 38 mm/h — ABNORMAL HIGH (ref 0–30)

## 2023-12-01 NOTE — Telephone Encounter (Signed)
 Patient scheduled for 12/07/2023 for Cimzia .

## 2023-12-01 NOTE — Progress Notes (Signed)
Ro antibody remains positive.  La antibody negative

## 2023-12-02 NOTE — Progress Notes (Signed)
 I called patient, SPEP did not reveal any abnormal protein bands. C4 remains low but has improved.  C3 WNL.   ANA remains positive.

## 2023-12-02 NOTE — Progress Notes (Signed)
 SPEP did not reveal any abnormal protein bands. C4 remains low but has improved.  C3 WNL.   ANA remains positive.

## 2023-12-04 ENCOUNTER — Telehealth: Payer: Self-pay | Admitting: Internal Medicine

## 2023-12-04 DIAGNOSIS — M0579 Rheumatoid arthritis with rheumatoid factor of multiple sites without organ or systems involvement: Secondary | ICD-10-CM

## 2023-12-04 MED ORDER — PREDNISONE 5 MG PO TABS
ORAL_TABLET | ORAL | 0 refills | Status: AC
Start: 2023-12-04 — End: 2023-12-16

## 2023-12-04 NOTE — Telephone Encounter (Signed)
 I spoke with Brittany Hodges she called due to increased pain and stiffness particularly in her neck and shoulders. This has been worsening during this week, and was noted to have some active inflammation on exam 7/14 along with elevated sed rate and with a gap of several months in cimzia  treatment as the likely cause. I sent a prednisone  prescription for a taper starting at 20 mg daily. Her last dose of steroids appears to have been in April prescribed from her dentist. She plans to return to clinic on Tuesday for cimzia  injection and I will also pass along this message.

## 2023-12-07 ENCOUNTER — Ambulatory Visit: Attending: Rheumatology | Admitting: *Deleted

## 2023-12-07 VITALS — BP 123/65 | HR 48

## 2023-12-07 DIAGNOSIS — M0579 Rheumatoid arthritis with rheumatoid factor of multiple sites without organ or systems involvement: Secondary | ICD-10-CM

## 2023-12-07 MED ORDER — CERTOLIZUMAB PEGOL 2 X 200 MG ~~LOC~~ KIT
400.0000 mg | PACK | Freq: Once | SUBCUTANEOUS | Status: AC
  Administered 2023-12-07: 400 mg via SUBCUTANEOUS

## 2023-12-07 NOTE — Progress Notes (Signed)
 Subjective:   Patient presents to clinic today to receive monthly dose of Cimzia .  Patient running a fever or have signs/symptoms of infection? No  Patient currently on antibiotics for the treatment of infection? No  Patient have any upcoming invasive procedures/surgeries? No  Objective: CMP     Component Value Date/Time   NA 141 11/29/2023 0830   NA 144 04/08/2021 1216   K 4.0 11/29/2023 0830   CL 106 11/29/2023 0830   CO2 27 11/29/2023 0830   GLUCOSE 83 11/29/2023 0830   BUN 12 11/29/2023 0830   BUN 15 04/08/2021 1216   CREATININE 0.78 11/29/2023 0830   CALCIUM 10.2 11/29/2023 0830   PROT 8.2 (H) 11/29/2023 0830   PROT 8.2 (H) 11/29/2023 0830   PROT 7.9 04/08/2021 1216   ALBUMIN 4.0 10/04/2022 0703   ALBUMIN 4.5 04/08/2021 1216   AST 22 11/29/2023 0830   AST 21 02/18/2021 1452   ALT 12 11/29/2023 0830   ALT 13 02/18/2021 1452   ALKPHOS 54 10/04/2022 0703   BILITOT 0.5 11/29/2023 0830   BILITOT 0.3 04/08/2021 1216   BILITOT 0.4 02/18/2021 1452   GFRNONAA >60 08/25/2023 1817   GFRNONAA 53 (L) 02/18/2021 1452   GFRNONAA 77 02/15/2020 1023   GFRAA 89 02/15/2020 1023    CBC    Component Value Date/Time   WBC 3.2 (L) 11/29/2023 0830   RBC 4.29 11/29/2023 0830   HGB 12.1 11/29/2023 0830   HGB 12.3 04/08/2021 1216   HGB 11.2 (L) 07/26/2013 0802   HCT 39.2 11/29/2023 0830   HCT 39.3 04/08/2021 1216   HCT 35.5 07/26/2013 0802   PLT 158 11/29/2023 0830   PLT 140 (L) 04/08/2021 1216   MCV 91.4 11/29/2023 0830   MCV 90 04/08/2021 1216   MCV 86 07/26/2013 0802   MCH 28.2 11/29/2023 0830   MCHC 30.9 (L) 11/29/2023 0830   RDW 11.8 11/29/2023 0830   RDW 12.0 04/08/2021 1216   RDW 13.6 07/26/2013 0802   LYMPHSABS 1,395 02/22/2023 1546   LYMPHSABS 2.0 04/08/2021 1216   LYMPHSABS 0.9 07/26/2013 0802   MONOABS 1.4 (H) 10/04/2022 0703   EOSABS 51 11/29/2023 0830   EOSABS 0.0 04/08/2021 1216   EOSABS 0.0 07/26/2013 0802   BASOSABS 19 11/29/2023 0830   BASOSABS 0.0  04/08/2021 1216   BASOSABS 0.0 07/26/2013 0802    Baseline Immunosuppressant Therapy Labs TB GOLD    Latest Ref Rng & Units 06/24/2023    3:18 PM  Quantiferon TB Gold  Quantiferon TB Gold Plus NEGATIVE NEGATIVE    Hepatitis Panel    Latest Ref Rng & Units 06/23/2018    3:51 PM  Hepatitis  Hep B Surface Ag NON-REACTI NON-REACTIVE   Hep B IgM NON-REACTI NON-REACTIVE   Hep C Ab NON-REACTI NON-REACTIVE   Hep A IgM NON-REACTI NON-REACTIVE    HIV Lab Results  Component Value Date   HIV NONREACTIVE 12/21/2014   Immunoglobulins    Latest Ref Rng & Units 06/23/2018    3:51 PM  Immunoglobulin Electrophoresis  IgA  47 - 310 mg/dL 599   IgG 399 - 8,359 mg/dL 7,803   IgM 50 - 699 mg/dL 881    SPEP    Latest Ref Rng & Units 11/29/2023    8:30 AM  Serum Protein Electrophoresis  Total Protein 6.1 - 8.1 g/dL 6.1 - 8.1 g/dL 8.2    8.2   Albumin 3.8 - 4.8 g/dL 4.3   Alpha-1 0.2 - 0.3 g/dL 0.3  Alpha-2 0.5 - 0.9 g/dL 0.6   Beta Globulin 0.4 - 0.6 g/dL 0.5   Beta 2 0.2 - 0.5 g/dL 0.5   Gamma Globulin 0.8 - 1.7 g/dL 2.1   Interpretation  --    G6PD No results found for: G6PDH TPMT No results found for: TPMT   Chest x-ray:  09/16/20-Stable exam without acute or active cardiopulmonary disease   Assessment/Plan:   Administrations This Visit     certolizumab pegol  (CIMZIA ) kit 400 mg     Admin Date 12/07/2023 Action Given Dose 400 mg Route Subcutaneous Documented By Cena Alfonso CROME, LPN             Patient tolerated injection well.   Appointment for next injection scheduled for 01/04/2024.  Patient due for labs in August 2025.  Patient is to call and reschedule appointment if running a fever with signs/symptoms of infection, on antibiotics for active infection or has an upcoming invasive procedure.  All questions encouraged and answered.  Instructed patient to call with any further questions or concerns.

## 2023-12-24 ENCOUNTER — Other Ambulatory Visit: Payer: Self-pay | Admitting: Internal Medicine

## 2023-12-24 LAB — HM DEXA SCAN

## 2023-12-28 ENCOUNTER — Telehealth: Payer: Self-pay

## 2023-12-28 NOTE — Telephone Encounter (Signed)
 Received DEXA results from [facility].  Date of Scan: 12/24/2023  Lowest T-score:-2.1  BMD:0.814  Lowest site measured:AP Spine  DX: Osteopenia  Significant changes in BMD and site measured (5% and above): -9% Right Total Femur -8% Left Total Femur -12% AP Total Spine   Current Regimen:Vitamin D   Recommendation:Osteopenia. Negative percent change in BMD of total right and left hip and AP spine. Recommends to add calcium, continue vitamin d , and resistive exercises. Could consider use of Bisphosphonate's. Plan to further discuss at the follow up visit. Patient verbalized understanding.   Reviewed by: Cheryl Waddell HERO, PA-C  Next Appointment: 03/23/2024

## 2023-12-29 ENCOUNTER — Ambulatory Visit: Admitting: Physician Assistant

## 2023-12-29 ENCOUNTER — Ambulatory Visit: Admitting: Internal Medicine

## 2024-01-04 ENCOUNTER — Ambulatory Visit

## 2024-01-06 ENCOUNTER — Ambulatory Visit: Attending: Rheumatology | Admitting: *Deleted

## 2024-01-06 DIAGNOSIS — M0579 Rheumatoid arthritis with rheumatoid factor of multiple sites without organ or systems involvement: Secondary | ICD-10-CM | POA: Diagnosis not present

## 2024-01-06 DIAGNOSIS — M3509 Sicca syndrome with other organ involvement: Secondary | ICD-10-CM

## 2024-01-06 DIAGNOSIS — Z79899 Other long term (current) drug therapy: Secondary | ICD-10-CM

## 2024-01-06 DIAGNOSIS — D708 Other neutropenia: Secondary | ICD-10-CM

## 2024-01-06 DIAGNOSIS — H209 Unspecified iridocyclitis: Secondary | ICD-10-CM

## 2024-01-06 MED ORDER — CERTOLIZUMAB PEGOL 2 X 200 MG ~~LOC~~ KIT
400.0000 mg | PACK | Freq: Once | SUBCUTANEOUS | Status: AC
  Administered 2024-01-06: 400 mg via SUBCUTANEOUS

## 2024-01-06 NOTE — Progress Notes (Signed)
 Pharmacy Note  Subjective:   Patient presents to clinic today to receive monthly dose of Cimzia .  Patient running a fever or have signs/symptoms of infection? No  Patient currently on antibiotics for the treatment of infection? No  Patient have any upcoming invasive procedures/surgeries? No  Objective: CMP     Component Value Date/Time   NA 141 11/29/2023 0830   NA 144 04/08/2021 1216   K 4.0 11/29/2023 0830   CL 106 11/29/2023 0830   CO2 27 11/29/2023 0830   GLUCOSE 83 11/29/2023 0830   BUN 12 11/29/2023 0830   BUN 15 04/08/2021 1216   CREATININE 0.78 11/29/2023 0830   CALCIUM 10.2 11/29/2023 0830   PROT 8.2 (H) 11/29/2023 0830   PROT 8.2 (H) 11/29/2023 0830   PROT 7.9 04/08/2021 1216   ALBUMIN 4.0 10/04/2022 0703   ALBUMIN 4.5 04/08/2021 1216   AST 22 11/29/2023 0830   AST 21 02/18/2021 1452   ALT 12 11/29/2023 0830   ALT 13 02/18/2021 1452   ALKPHOS 54 10/04/2022 0703   BILITOT 0.5 11/29/2023 0830   BILITOT 0.3 04/08/2021 1216   BILITOT 0.4 02/18/2021 1452   GFRNONAA >60 08/25/2023 1817   GFRNONAA 53 (L) 02/18/2021 1452   GFRNONAA 77 02/15/2020 1023   GFRAA 89 02/15/2020 1023    CBC    Component Value Date/Time   WBC 3.2 (L) 11/29/2023 0830   RBC 4.29 11/29/2023 0830   HGB 12.1 11/29/2023 0830   HGB 12.3 04/08/2021 1216   HGB 11.2 (L) 07/26/2013 0802   HCT 39.2 11/29/2023 0830   HCT 39.3 04/08/2021 1216   HCT 35.5 07/26/2013 0802   PLT 158 11/29/2023 0830   PLT 140 (L) 04/08/2021 1216   MCV 91.4 11/29/2023 0830   MCV 90 04/08/2021 1216   MCV 86 07/26/2013 0802   MCH 28.2 11/29/2023 0830   MCHC 30.9 (L) 11/29/2023 0830   RDW 11.8 11/29/2023 0830   RDW 12.0 04/08/2021 1216   RDW 13.6 07/26/2013 0802   LYMPHSABS 1,395 02/22/2023 1546   LYMPHSABS 2.0 04/08/2021 1216   LYMPHSABS 0.9 07/26/2013 0802   MONOABS 1.4 (H) 10/04/2022 0703   EOSABS 51 11/29/2023 0830   EOSABS 0.0 04/08/2021 1216   EOSABS 0.0 07/26/2013 0802   BASOSABS 19 11/29/2023 0830    BASOSABS 0.0 04/08/2021 1216   BASOSABS 0.0 07/26/2013 0802    Baseline Immunosuppressant Therapy Labs TB GOLD    Latest Ref Rng & Units 06/24/2023    3:18 PM  Quantiferon TB Gold  Quantiferon TB Gold Plus NEGATIVE NEGATIVE    Hepatitis Panel    Latest Ref Rng & Units 06/23/2018    3:51 PM  Hepatitis  Hep B Surface Ag NON-REACTI NON-REACTIVE   Hep B IgM NON-REACTI NON-REACTIVE   Hep C Ab NON-REACTI NON-REACTIVE   Hep A IgM NON-REACTI NON-REACTIVE    HIV Lab Results  Component Value Date   HIV NONREACTIVE 12/21/2014   Immunoglobulins    Latest Ref Rng & Units 06/23/2018    3:51 PM  Immunoglobulin Electrophoresis  IgA  47 - 310 mg/dL 599   IgG 399 - 8,359 mg/dL 7,803   IgM 50 - 699 mg/dL 881    SPEP    Latest Ref Rng & Units 11/29/2023    8:30 AM  Serum Protein Electrophoresis  Total Protein 6.1 - 8.1 g/dL 6.1 - 8.1 g/dL 8.2    8.2   Albumin 3.8 - 4.8 g/dL 4.3   Alpha-1 0.2 - 0.3  g/dL 0.3   Alpha-2 0.5 - 0.9 g/dL 0.6   Beta Globulin 0.4 - 0.6 g/dL 0.5   Beta 2 0.2 - 0.5 g/dL 0.5   Gamma Globulin 0.8 - 1.7 g/dL 2.1   Interpretation  --    G6PD No results found for: G6PDH TPMT No results found for: TPMT   Chest x-ray: 09/16/20-Stable exam without acute or active cardiopulmonary disease   Assessment/Plan:   Administrations This Visit     certolizumab pegol  (CIMZIA ) kit 400 mg     Admin Date 01/06/2024 Action Given Dose 400 mg Route Subcutaneous Documented By Cena Alfonso CROME, LPN             Patient tolerated injection well.   Appointment for next injection scheduled for 9/18/.2025  Patient due for labs in October 2025.  Patient is to call and reschedule appointment if running a fever with signs/symptoms of infection, on antibiotics for active infection or has an upcoming invasive procedure.  All questions encouraged and answered.  Instructed patient to call with any further questions or concerns.

## 2024-01-07 ENCOUNTER — Ambulatory Visit (INDEPENDENT_AMBULATORY_CARE_PROVIDER_SITE_OTHER): Admitting: Internal Medicine

## 2024-01-07 ENCOUNTER — Ambulatory Visit: Payer: Self-pay | Admitting: Rheumatology

## 2024-01-07 ENCOUNTER — Ambulatory Visit: Payer: Managed Care, Other (non HMO) | Admitting: Internal Medicine

## 2024-01-07 ENCOUNTER — Encounter: Payer: Self-pay | Admitting: Internal Medicine

## 2024-01-07 VITALS — BP 132/80 | HR 58 | Temp 98.1°F | Resp 16 | Ht 69.0 in | Wt 140.2 lb

## 2024-01-07 DIAGNOSIS — E038 Other specified hypothyroidism: Secondary | ICD-10-CM

## 2024-01-07 DIAGNOSIS — I10 Essential (primary) hypertension: Secondary | ICD-10-CM

## 2024-01-07 DIAGNOSIS — I495 Sick sinus syndrome: Secondary | ICD-10-CM | POA: Diagnosis not present

## 2024-01-07 DIAGNOSIS — M0579 Rheumatoid arthritis with rheumatoid factor of multiple sites without organ or systems involvement: Secondary | ICD-10-CM

## 2024-01-07 DIAGNOSIS — Q2111 Secundum atrial septal defect: Secondary | ICD-10-CM

## 2024-01-07 LAB — CBC WITH DIFFERENTIAL/PLATELET
Absolute Lymphocytes: 1676 {cells}/uL (ref 850–3900)
Absolute Monocytes: 666 {cells}/uL (ref 200–950)
Basophils Absolute: 19 {cells}/uL (ref 0–200)
Basophils Relative: 0.5 %
Eosinophils Absolute: 59 {cells}/uL (ref 15–500)
Eosinophils Relative: 1.6 %
HCT: 37.5 % (ref 35.0–45.0)
Hemoglobin: 11.9 g/dL (ref 11.7–15.5)
MCH: 28.8 pg (ref 27.0–33.0)
MCHC: 31.7 g/dL — ABNORMAL LOW (ref 32.0–36.0)
MCV: 90.8 fL (ref 80.0–100.0)
MPV: 12.3 fL (ref 7.5–12.5)
Monocytes Relative: 18 %
Neutro Abs: 1280 {cells}/uL — ABNORMAL LOW (ref 1500–7800)
Neutrophils Relative %: 34.6 %
Platelets: 157 Thousand/uL (ref 140–400)
RBC: 4.13 Million/uL (ref 3.80–5.10)
RDW: 12.2 % (ref 11.0–15.0)
Total Lymphocyte: 45.3 %
WBC: 3.7 Thousand/uL — ABNORMAL LOW (ref 3.8–10.8)

## 2024-01-07 NOTE — Patient Instructions (Signed)
 Continue checking your blood pressure regularly Blood pressure goal:  between 110/65 and  135/85. If it is consistently higher or lower, let me know  Vaccines to consider Flu shot every fall A COVID booster A pneumonia shot (PNM 20)   Go to the front desk for the checkout Please make an appointment for physical exam in February 2026

## 2024-01-07 NOTE — Progress Notes (Signed)
 Subjective:    Patient ID: Brittany Hodges, female    DOB: 1966/04/28, 58 y.o.   MRN: [id number]  DOS:  01/07/2024 Type of visit - description: Follow-up  Here for follow-up. Feeling well. Ambulatory BPs WNL.   Review of Systems See above   Past Medical History:  Diagnosis Date   Allergic rhinitis, cause unspecified    Anemia    Asthma    Atrial septal defect    s/p repair;   Echo 08/07/10: EF 55-60%; mild MR; atrial septal aneurysm; no residual ASD   Chest pain, unspecified    cardiac cath 07/31/10: normal coronaries; vigorous LVF   Esophageal reflux    Fibromyalgia    Headache(784.0)    Hematuria, unspecified    Hypertension    no meds    Hypothyroidism    Irritable bowel syndrome    Low back pain    Rheumatoid arthritis(714.0)    Dr Margarita   Scoliosis    Sinoatrial node dysfunction (HCC)    eval. for chronotropic competence completed in past   Sjogren's disease (HCC)     Past Surgical History:  Procedure Laterality Date   ASD REPAIR  1985   TONSILLECTOMY AND ADENOIDECTOMY     TUBAL LIGATION      Current Outpatient Medications  Medication Instructions   amLODipine  (NORVASC ) 5 mg, Oral, Daily   amoxicillin  (AMOXIL ) 500 MG capsule 2,000 capsules,  Once   BLACK CURRANT SEED OIL PO Daily   Carboxymethylcellulose Sodium (EYE DROPS OP) Daily   CEQUA 0.09 % SOLN 1 drop, 2 times daily   Cimzia  400 mg, Every 28 days   ergocalciferol  (VITAMIN D2) 50,000 Units, Oral, Weekly   meclizine  (ANTIVERT ) 12.5 mg, Oral, 3 times daily PRN   MISC NATURAL PRODUCTS PO 1 capsule, Daily   MISC NATURAL PRODUCTS PO Take by mouth. Blood Pressure   MISC NATURAL PRODUCTS PO Take by mouth. Wellness Women's   MISC NATURAL PRODUCTS PO Take by mouth. Oregano Oil   prednisoLONE acetate (PRED FORTE) 1 % ophthalmic suspension 1 drop, Daily   Vitamin D  2,000 Units, Daily       Objective:   Physical Exam BP 132/80   Pulse (!) 58   Temp 98.1 F (36.7 C) (Oral)   Resp 16   Ht  5' 9 (1.753 m)   Wt 140 lb 4 oz (63.6 kg)   LMP 05/01/2016   SpO2 98%   BMI 20.71 kg/m  General:   Well developed, NAD, BMI noted. HEENT:  Normocephalic . Face symmetric, atraumatic Lungs:  CTA B Normal respiratory effort, no intercostal retractions, no accessory muscle use. Heart: RRR,  no murmur.  Lower extremities: no pretibial edema bilaterally  Skin: Not pale. Not jaundice Neurologic:  alert & oriented X3.  Speech normal, gait appropriate for age and unassisted Psych--  Cognition and judgment appear intact.  Cooperative with normal attention span and concentration.  Behavior appropriate. No anxious or depressed appearing.      Assessment     Assessment   Prediabetes: A1c 6.0 (01-2017) HTN H/o Hypothyroidism (took meds at some point) GERD- dysphagia -Dr. Dorinda EGD from 04/02/2014 (Dr Waverly): Narrowing of proximal esophagus, question of extrinsic extrinsic compression, EUS showing no intrinsic esophageal mass but the spine was seen in close proximity  to the esophagus probably resulting in extrinsic compression. The endoscopist recommend repeat CT neck and chest in 3 months.saw ENT 04-2015 for dysphagia,had a scope,  rx PPI Asthma, uses alb rarely  CV: --  Atrial septal defect: Status post repair, no residual ASD: Needs ABX prophylaxis preprocedures --Sinoatrial  Dysfunction, sees Dr Fernande   --CP: cath 2012, normal coronaries MSK: --Rheumatoid arthritis,   Dr. Margarita + rheumatoid factor +CCP,   h/o  low WBC with Methotrexate ,   injection site reaction to Enbrel  , self d/c Humira  ~ 12/2018 --Osteopenia: T score -1.7 (2020), -1.1 (10/2020); T-score -2.1 (12/2023) at rheumatology. IBS (Miralax prn constipation)   syncope 04-2016  History of fibromyalgia   PLAN: HTN: Since the last visit, good compliance with amlodipine5 mg, ambulatory BPs 120/80, 117/70.  No change continue monitoring. Rheumatoid arthritis: Per rheumatology, on Cimzia . CV: Cardiology EP OV  09/17/2023: Noted to have sinus node dysfunction, junctional bradycardia post ASD repair.  No indication for PPM. Preventive care: Recommend flu, COVID and pneumonia shot, particular b/c takesCimzia.  Benefits discussed.  Patient declined RTC CPX 06/2024

## 2024-01-07 NOTE — Progress Notes (Signed)
 White cell count remains low but improved.  No change in treatment advised.

## 2024-01-09 NOTE — Assessment & Plan Note (Signed)
 HTN: Since the last visit, good compliance with amlodipine5 mg, ambulatory BPs 120/80, 117/70.  No change continue monitoring. Rheumatoid arthritis: Per rheumatology, on Cimzia . CV: Cardiology EP OV 09/17/2023: Noted to have sinus node dysfunction, junctional bradycardia post ASD repair.  No indication for PPM. Preventive care: Recommend flu, COVID and pneumonia shot, particular b/c takesCimzia.  Benefits discussed.  Patient declined RTC CPX 06/2024

## 2024-01-19 ENCOUNTER — Encounter: Payer: Self-pay | Admitting: Physician Assistant

## 2024-01-20 NOTE — Progress Notes (Unsigned)
  Electrophysiology Office Note:   Date:  01/21/2024  ID:  Brittany Hodges, DOB 07-19-1965, MRN [id number]  Primary Cardiologist: None Electrophysiologist: Elspeth Sage, MD   Electrophysiologist:  Elspeth Sage, MD      History of Present Illness:   Brittany Hodges is a 58 y.o. female with h/o SND / junctional rhythm s/p ASD repair, neurally mediated syncope, AT, palpitations, HTN, and Sjogren's seen today for routine electrophysiology followup.   Since last being seen in our clinic the patient reports doing very well. Overall, she denies chest pain, palpitations, dyspnea, PND, orthopnea, nausea, vomiting, dizziness, syncope, edema, weight gain, or early satiety.  Reports that HRs continue to rise well with exercise.   Review of systems complete and found to be negative unless listed in HPI.   EP Information / Studies Reviewed:    EKG is ordered today. Personal review as below.  EKG Interpretation Date/Time:  Friday January 21 2024 08:30:35 EDT Ventricular Rate:  40 PR Interval:    QRS Duration:  84 QT Interval:  432 QTC Calculation: 352 R Axis:   77  Text Interpretation: Sinus bradycardia Confirmed by Lesia Sharper [phone]) on 01/21/2024 8:48:42 AM    Arrhythmia/Device History No specialty comments available.   Physical Exam:   VS:  BP 124/86   Pulse (!) 40   Ht 5' 9 (1.753 m)   Wt 138 lb 3.2 oz (62.7 kg)   LMP 05/01/2016   SpO2 95%   BMI 20.41 kg/m    Wt Readings from Last 3 Encounters:  01/21/24 138 lb 3.2 oz (62.7 kg)  01/07/24 140 lb 4 oz (63.6 kg)  11/29/23 139 lb 6.4 oz (63.2 kg)     GEN: No acute distress NECK: No JVD; No carotid bruits CARDIAC: Regular rate and rhythm, no murmurs, rubs, gallops RESPIRATORY:  Clear to auscultation without rales, wheezing or rhonchi  ABDOMEN: Soft, non-tender, non-distended EXTREMITIES:  No edema; No deformity   ASSESSMENT AND PLAN:    Sinus Node Dysfunction  Junctional Bradycardia  Post ASD repair, prior  exercise testing patient able to get HR up to 155 bpm  EKG shows sinus bradycardia at 40 Stable exercise tolerant.  No current indication for PPM. She understands she may require one at some point down the line but her conduction system appears stable    Hx Neurally Mediated Syncope  No further episodes, last was 10 years ago     HTN Stable on current regimen  Echo with normal EF    ASD s/p Repair  Stable on echo   Follow up with EP Team in 12 months  Signed, Sharper Prentice Lesia, PA-C

## 2024-01-21 ENCOUNTER — Ambulatory Visit: Attending: Student | Admitting: Student

## 2024-01-21 ENCOUNTER — Encounter: Payer: Self-pay | Admitting: Student

## 2024-01-21 VITALS — BP 124/86 | HR 40 | Ht 69.0 in | Wt 138.2 lb

## 2024-01-21 DIAGNOSIS — R001 Bradycardia, unspecified: Secondary | ICD-10-CM

## 2024-01-21 DIAGNOSIS — I495 Sick sinus syndrome: Secondary | ICD-10-CM

## 2024-01-21 DIAGNOSIS — I10 Essential (primary) hypertension: Secondary | ICD-10-CM

## 2024-01-21 NOTE — Patient Instructions (Signed)
 Medication Instructions:  Your physician recommends that you continue on your current medications as directed. Please refer to the Current Medication list given to you today.  *If you need a refill on your cardiac medications before your next appointment, please call your pharmacy*  Lab Work: None ordered If you have labs (blood work) drawn today and your tests are completely normal, you will receive your results only by: MyChart Message (if you have MyChart) OR A paper copy in the mail If you have any lab test that is abnormal or we need to change your treatment, we will call you to review the results.  Follow-Up: At [facility], you and your health needs are our priority.  As part of our continuing mission to provide you with exceptional heart care, our providers are all part of one team.  This team includes your primary Cardiologist (physician) and Advanced Practice Providers or APPs (Physician Assistants and Nurse Practitioners) who all work together to provide you with the care you need, when you need it.  Your next appointment:   1 year(s)  Provider:   Agatha Horsfall, MD

## 2024-02-03 ENCOUNTER — Ambulatory Visit

## 2024-02-09 ENCOUNTER — Ambulatory Visit: Attending: Rheumatology | Admitting: *Deleted

## 2024-02-09 VITALS — BP 119/71 | HR 49

## 2024-02-09 DIAGNOSIS — M0579 Rheumatoid arthritis with rheumatoid factor of multiple sites without organ or systems involvement: Secondary | ICD-10-CM

## 2024-02-09 MED ORDER — CERTOLIZUMAB PEGOL 2 X 200 MG ~~LOC~~ KIT
400.0000 mg | PACK | Freq: Once | SUBCUTANEOUS | Status: AC
  Administered 2024-02-09: 400 mg via SUBCUTANEOUS

## 2024-02-09 NOTE — Progress Notes (Signed)
 Pharmacy Note  Subjective:   Patient presents to clinic today to receive monthly dose of Cimzia .  Patient running a fever or have signs/symptoms of infection? No  Patient currently on antibiotics for the treatment of infection? No  Patient have any upcoming invasive procedures/surgeries? No  Objective: CMP     Component Value Date/Time   NA 141 11/29/2023 0830   NA 144 04/08/2021 1216   K 4.0 11/29/2023 0830   CL 106 11/29/2023 0830   CO2 27 11/29/2023 0830   GLUCOSE 83 11/29/2023 0830   BUN 12 11/29/2023 0830   BUN 15 04/08/2021 1216   CREATININE 0.78 11/29/2023 0830   CALCIUM 10.2 11/29/2023 0830   PROT 8.2 (H) 11/29/2023 0830   PROT 8.2 (H) 11/29/2023 0830   PROT 7.9 04/08/2021 1216   ALBUMIN 4.0 10/04/2022 0703   ALBUMIN 4.5 04/08/2021 1216   AST 22 11/29/2023 0830   AST 21 02/18/2021 1452   ALT 12 11/29/2023 0830   ALT 13 02/18/2021 1452   ALKPHOS 54 10/04/2022 0703   BILITOT 0.5 11/29/2023 0830   BILITOT 0.3 04/08/2021 1216   BILITOT 0.4 02/18/2021 1452   GFRNONAA >60 08/25/2023 1817   GFRNONAA 53 (L) 02/18/2021 1452   GFRNONAA 77 02/15/2020 1023   GFRAA 89 02/15/2020 1023    CBC    Component Value Date/Time   WBC 3.7 (L) 01/06/2024 1519   RBC 4.13 01/06/2024 1519   HGB 11.9 01/06/2024 1519   HGB 12.3 04/08/2021 1216   HGB 11.2 (L) 07/26/2013 0802   HCT 37.5 01/06/2024 1519   HCT 39.3 04/08/2021 1216   HCT 35.5 07/26/2013 0802   PLT 157 01/06/2024 1519   PLT 140 (L) 04/08/2021 1216   MCV 90.8 01/06/2024 1519   MCV 90 04/08/2021 1216   MCV 86 07/26/2013 0802   MCH 28.8 01/06/2024 1519   MCHC 31.7 (L) 01/06/2024 1519   RDW 12.2 01/06/2024 1519   RDW 12.0 04/08/2021 1216   RDW 13.6 07/26/2013 0802   LYMPHSABS 1,395 02/22/2023 1546   LYMPHSABS 2.0 04/08/2021 1216   LYMPHSABS 0.9 07/26/2013 0802   MONOABS 1.4 (H) 10/04/2022 0703   EOSABS 59 01/06/2024 1519   EOSABS 0.0 04/08/2021 1216   EOSABS 0.0 07/26/2013 0802   BASOSABS 19 01/06/2024 1519    BASOSABS 0.0 04/08/2021 1216   BASOSABS 0.0 07/26/2013 0802    Baseline Immunosuppressant Therapy Labs TB GOLD    Latest Ref Rng & Units 06/24/2023    3:18 PM  Quantiferon TB Gold  Quantiferon TB Gold Plus NEGATIVE NEGATIVE    Hepatitis Panel    Latest Ref Rng & Units 06/23/2018    3:51 PM  Hepatitis  Hep B Surface Ag NON-REACTI NON-REACTIVE   Hep B IgM NON-REACTI NON-REACTIVE   Hep C Ab NON-REACTI NON-REACTIVE   Hep A IgM NON-REACTI NON-REACTIVE    HIV Lab Results  Component Value Date   HIV NONREACTIVE 12/21/2014   Immunoglobulins    Latest Ref Rng & Units 06/23/2018    3:51 PM  Immunoglobulin Electrophoresis  IgA  47 - 310 mg/dL 599   IgG 399 - 8,359 mg/dL 7,803   IgM 50 - 699 mg/dL 881    SPEP    Latest Ref Rng & Units 11/29/2023    8:30 AM  Serum Protein Electrophoresis  Total Protein 6.1 - 8.1 g/dL 6.1 - 8.1 g/dL 8.2    8.2   Albumin 3.8 - 4.8 g/dL 4.3   Alpha-1 0.2 - 0.3  g/dL 0.3   Alpha-2 0.5 - 0.9 g/dL 0.6   Beta Globulin 0.4 - 0.6 g/dL 0.5   Beta 2 0.2 - 0.5 g/dL 0.5   Gamma Globulin 0.8 - 1.7 g/dL 2.1   Interpretation  --    G6PD No results found for: G6PDH TPMT No results found for: TPMT   Chest x-ray: 09/16/20-Stable exam without acute or active cardiopulmonary disease   Assessment/Plan:   Administrations This Visit     certolizumab pegol  (CIMZIA ) kit 400 mg     Admin Date 02/09/2024 Action Given Dose 400 mg Route Subcutaneous Documented By Cena Alfonso CROME, LPN             Patient tolerated injection well.   Appointment for next injection scheduled for 03/08/2024.  Patient due for labs in October 2025.  Patient is to call and reschedule appointment if running a fever with signs/symptoms of infection, on antibiotics for active infection or has an upcoming invasive procedure.  All questions encouraged and answered.  Instructed patient to call with any further questions or concerns.

## 2024-02-21 ENCOUNTER — Telehealth: Payer: Self-pay | Admitting: Rheumatology

## 2024-02-21 NOTE — Telephone Encounter (Signed)
 Patient left a voicemail requesting to speak with Brittany Hodges directly.

## 2024-02-22 NOTE — Telephone Encounter (Signed)
 Attempted to contact the patient and left message for patient to call the office.

## 2024-02-22 NOTE — Telephone Encounter (Signed)
 Patient contacted the office asking if easy bruising is a side effect of Cimzia . Patient states she hit two of her fingers over the weekend and bruised them. Please advise.

## 2024-02-22 NOTE — Telephone Encounter (Signed)
 Cimzia  does not increase risk for bruising at non-injection sites. Bruising should resolve over time  Brittany Hodges, PharmD, MPH, BCPS, CPP Clinical Pharmacist [facility])

## 2024-02-22 NOTE — Telephone Encounter (Signed)
 Patient advised Cimzia  does not increase risk for bruising at non-injection sites. Bruising should resolve over time.

## 2024-03-03 ENCOUNTER — Ambulatory Visit: Admitting: Student

## 2024-03-03 ENCOUNTER — Encounter: Payer: Self-pay | Admitting: Student

## 2024-03-03 VITALS — BP 136/82 | HR 60 | Temp 98.2°F | Ht 69.0 in | Wt 139.2 lb

## 2024-03-03 DIAGNOSIS — J069 Acute upper respiratory infection, unspecified: Secondary | ICD-10-CM | POA: Insufficient documentation

## 2024-03-03 DIAGNOSIS — J3489 Other specified disorders of nose and nasal sinuses: Secondary | ICD-10-CM

## 2024-03-03 DIAGNOSIS — R0981 Nasal congestion: Secondary | ICD-10-CM | POA: Diagnosis not present

## 2024-03-03 DIAGNOSIS — R051 Acute cough: Secondary | ICD-10-CM | POA: Insufficient documentation

## 2024-03-03 LAB — POC COVID19 BINAXNOW: SARS Coronavirus 2 Ag: NEGATIVE

## 2024-03-03 MED ORDER — GUAIFENESIN ER 600 MG PO TB12: 600.0000 mg | ORAL_TABLET | Freq: Two times a day (BID) | ORAL | 0 refills | Status: AC

## 2024-03-03 NOTE — Progress Notes (Signed)
 No chief complaint on file.   Brittany Hodges here for URI complaints.  PMHX- RA, Allergies  History of Present Illness Brittany Hodges is a 58 year old female who presents with upper respiratory symptoms.  She has experienced nasal congestion, losing her voice,  Monday. She denies sore throat, fever, or shortness of breath. Mild sinus pressure. She has Hx allergies causing nasal congestion and an itchy nose, but denies itchy or watery eyes. She is not taking any daily medications for her allergies.  Patient denies fever, chills, SOB, CP, palpitations, dyspnea, edema, HA, vision changes, N/V/D, abdominal pain, urinary symptoms, rash, weight changes, and recent illness or hospitalizations.    Past Medical History:  Diagnosis Date   Allergic rhinitis, cause unspecified    Anemia    Asthma    Atrial septal defect    s/p repair;   Echo 08/07/10: EF 55-60%; mild MR; atrial septal aneurysm; no residual ASD   Chest pain, unspecified    cardiac cath 07/31/10: normal coronaries; vigorous LVF   Esophageal reflux    Fibromyalgia    Headache(784.0)    Hematuria, unspecified    Hypertension    no meds    Hypothyroidism    Irritable bowel syndrome    Low back pain    Rheumatoid arthritis(714.0)    Dr Margarita   Scoliosis    Sinoatrial node dysfunction (HCC)    eval. for chronotropic competence completed in past   Sjogren's disease     Objective BP 136/82   Pulse 60   Temp 98.2 F (36.8 C)   Ht 5' 9 (1.753 m)   Wt 139 lb 3.2 oz (63.1 kg)   LMP 05/01/2016   SpO2 100%   BMI 20.56 kg/m  General: Awake, alert, appears stated age HEENT: AT, [place], ears patent b/l and TM's neg, nares patent w/o discharge, pharynx pink and without exudates, MMM Neck: No masses or asymmetry Heart: RRR Lungs: CTAB, no accessory muscle use Psych: Age appropriate judgment and insight, normal mood and affect  Viral URI with cough  Acute cough - Plan: POC COVID-19 BinaxNow  Congestion  of nasal sinus - Plan: fluticasone  (FLONASE ) 50 MCG/ACT nasal spray, guaiFENesin  (MUCINEX ) 600 MG 12 hr tablet  Sinus pressure - Plan: POC COVID-19 BinaxNow, guaiFENesin  (MUCINEX ) 600 MG 12 hr tablet   Symptoms consistent with viral upper respiratory infection. Negative COVID-19 and flu tests.  - Rx-Flonase   and mMinex for congestion. - Advise over-the-counter Zyrtec  for allergy symptoms. -Emphasize hydration. - Advise to contact if symptoms worsen or persist beyond 10 days.  Allergic rhinitis Current nasal itching and congestion, likely seasonal exacerbation. - Flonase  daily  General Health Maintenance - Advise flu shot once feeling better. Continue to push fluids, practice good hand hygiene, cover mouth when coughing. F/u prn. If starting to experience fevers, shaking, or shortness of breath, seek immediate care. Pt voiced understanding and agreement to the plan.  Harlene LITTIE Jolly, DNP, AGNP-C 03/03/24 2:24 PM

## 2024-03-08 ENCOUNTER — Ambulatory Visit

## 2024-03-09 ENCOUNTER — Telehealth: Payer: Self-pay

## 2024-03-09 DIAGNOSIS — J069 Acute upper respiratory infection, unspecified: Secondary | ICD-10-CM

## 2024-03-09 DIAGNOSIS — R051 Acute cough: Secondary | ICD-10-CM

## 2024-03-09 NOTE — Telephone Encounter (Signed)
 Copied from CRM [id number]. Topic: General - Other >> Mar 09, 2024 11:18 AM Deaijah H wrote: Reason for CRM: Patient still congested & voice still going in/out, still coughing more at night

## 2024-03-09 NOTE — Telephone Encounter (Signed)
 Please advise- seen by Harlene Jolly on 03/03/24.

## 2024-03-10 MED ORDER — BENZONATATE 100 MG PO CAPS: 100.0000 mg | ORAL_CAPSULE | Freq: Three times a day (TID) | ORAL | 0 refills | Status: AC | PRN

## 2024-03-10 MED ORDER — AZITHROMYCIN 250 MG PO TABS: ORAL_TABLET | ORAL | 0 refills | Status: AC

## 2024-03-10 NOTE — Telephone Encounter (Signed)
 Brittany Hodges- Please let patient know I have sent in azithromycin  Z-Pak antibiotic and Tessalon Perles to help with cough symptoms.  Return to clinic for persistent or worsening symptoms.    Spoke w/ Pt- informed of recommendations. Pt verbalized understanding

## 2024-03-15 ENCOUNTER — Telehealth: Payer: Self-pay | Admitting: Rheumatology

## 2024-03-15 NOTE — Telephone Encounter (Signed)
Patient left a voicemail requesting to speak with Seth Bake regarding her Cimzia injection.

## 2024-03-15 NOTE — Telephone Encounter (Signed)
 Reached out to patient to schedule her for 03/23/2024 for Cimzia  injection.

## 2024-03-23 ENCOUNTER — Other Ambulatory Visit: Payer: Self-pay

## 2024-03-23 ENCOUNTER — Ambulatory Visit: Admitting: *Deleted

## 2024-03-23 ENCOUNTER — Encounter: Payer: Self-pay | Admitting: Physician Assistant

## 2024-03-23 ENCOUNTER — Encounter: Payer: Self-pay | Admitting: *Deleted

## 2024-03-23 ENCOUNTER — Ambulatory Visit: Attending: Physician Assistant | Admitting: Physician Assistant

## 2024-03-23 VITALS — BP 125/75 | HR 62 | Temp 97.8°F | Resp 14 | Ht 68.0 in | Wt 135.0 lb

## 2024-03-23 VITALS — BP 125/75 | HR 62

## 2024-03-23 DIAGNOSIS — Z79899 Other long term (current) drug therapy: Secondary | ICD-10-CM | POA: Diagnosis not present

## 2024-03-23 DIAGNOSIS — M8589 Other specified disorders of bone density and structure, multiple sites: Secondary | ICD-10-CM

## 2024-03-23 DIAGNOSIS — M0579 Rheumatoid arthritis with rheumatoid factor of multiple sites without organ or systems involvement: Secondary | ICD-10-CM

## 2024-03-23 DIAGNOSIS — Z8669 Personal history of other diseases of the nervous system and sense organs: Secondary | ICD-10-CM

## 2024-03-23 DIAGNOSIS — M3509 Sicca syndrome with other organ involvement: Secondary | ICD-10-CM | POA: Diagnosis not present

## 2024-03-23 DIAGNOSIS — Z8679 Personal history of other diseases of the circulatory system: Secondary | ICD-10-CM

## 2024-03-23 DIAGNOSIS — M47816 Spondylosis without myelopathy or radiculopathy, lumbar region: Secondary | ICD-10-CM

## 2024-03-23 DIAGNOSIS — D708 Other neutropenia: Secondary | ICD-10-CM

## 2024-03-23 DIAGNOSIS — H209 Unspecified iridocyclitis: Secondary | ICD-10-CM | POA: Diagnosis not present

## 2024-03-23 DIAGNOSIS — Z8719 Personal history of other diseases of the digestive system: Secondary | ICD-10-CM

## 2024-03-23 DIAGNOSIS — M19042 Primary osteoarthritis, left hand: Secondary | ICD-10-CM

## 2024-03-23 DIAGNOSIS — Z8639 Personal history of other endocrine, nutritional and metabolic disease: Secondary | ICD-10-CM

## 2024-03-23 DIAGNOSIS — Z8739 Personal history of other diseases of the musculoskeletal system and connective tissue: Secondary | ICD-10-CM

## 2024-03-23 DIAGNOSIS — M19072 Primary osteoarthritis, left ankle and foot: Secondary | ICD-10-CM

## 2024-03-23 DIAGNOSIS — M7061 Trochanteric bursitis, right hip: Secondary | ICD-10-CM

## 2024-03-23 DIAGNOSIS — M19041 Primary osteoarthritis, right hand: Secondary | ICD-10-CM

## 2024-03-23 DIAGNOSIS — M19071 Primary osteoarthritis, right ankle and foot: Secondary | ICD-10-CM

## 2024-03-23 DIAGNOSIS — M5030 Other cervical disc degeneration, unspecified cervical region: Secondary | ICD-10-CM

## 2024-03-23 MED ORDER — PREDNISONE 5 MG PO TABS
ORAL_TABLET | ORAL | 0 refills | Status: AC
Start: 2024-03-23 — End: ?

## 2024-03-23 MED ORDER — CERTOLIZUMAB PEGOL 2 X 200 MG ~~LOC~~ KIT
400.0000 mg | PACK | Freq: Once | SUBCUTANEOUS | Status: AC
  Administered 2024-03-23: 400 mg via SUBCUTANEOUS

## 2024-03-23 NOTE — Progress Notes (Signed)
 Subjective:   Patient presents to clinic today to receive monthly dose of Cimzia .  Patient running a fever or have signs/symptoms of infection? No  Patient currently on antibiotics for the treatment of infection? No  Patient have any upcoming invasive procedures/surgeries? No  Objective: CMP     Component Value Date/Time   NA 141 11/29/2023 0830   NA 144 04/08/2021 1216   K 4.0 11/29/2023 0830   CL 106 11/29/2023 0830   CO2 27 11/29/2023 0830   GLUCOSE 83 11/29/2023 0830   BUN 12 11/29/2023 0830   BUN 15 04/08/2021 1216   CREATININE 0.78 11/29/2023 0830   CALCIUM 10.2 11/29/2023 0830   PROT 8.2 (H) 11/29/2023 0830   PROT 8.2 (H) 11/29/2023 0830   PROT 7.9 04/08/2021 1216   ALBUMIN 4.0 10/04/2022 0703   ALBUMIN 4.5 04/08/2021 1216   AST 22 11/29/2023 0830   AST 21 02/18/2021 1452   ALT 12 11/29/2023 0830   ALT 13 02/18/2021 1452   ALKPHOS 54 10/04/2022 0703   BILITOT 0.5 11/29/2023 0830   BILITOT 0.3 04/08/2021 1216   BILITOT 0.4 02/18/2021 1452   GFRNONAA >60 08/25/2023 1817   GFRNONAA 53 (L) 02/18/2021 1452   GFRNONAA 77 02/15/2020 1023   GFRAA 89 02/15/2020 1023    CBC    Component Value Date/Time   WBC 3.7 (L) 01/06/2024 1519   RBC 4.13 01/06/2024 1519   HGB 11.9 01/06/2024 1519   HGB 12.3 04/08/2021 1216   HGB 11.2 (L) 07/26/2013 0802   HCT 37.5 01/06/2024 1519   HCT 39.3 04/08/2021 1216   HCT 35.5 07/26/2013 0802   PLT 157 01/06/2024 1519   PLT 140 (L) 04/08/2021 1216   MCV 90.8 01/06/2024 1519   MCV 90 04/08/2021 1216   MCV 86 07/26/2013 0802   MCH 28.8 01/06/2024 1519   MCHC 31.7 (L) 01/06/2024 1519   RDW 12.2 01/06/2024 1519   RDW 12.0 04/08/2021 1216   RDW 13.6 07/26/2013 0802   LYMPHSABS 1,395 02/22/2023 1546   LYMPHSABS 2.0 04/08/2021 1216   LYMPHSABS 0.9 07/26/2013 0802   MONOABS 1.4 (H) 10/04/2022 0703   EOSABS 59 01/06/2024 1519   EOSABS 0.0 04/08/2021 1216   EOSABS 0.0 07/26/2013 0802   BASOSABS 19 01/06/2024 1519   BASOSABS 0.0  04/08/2021 1216   BASOSABS 0.0 07/26/2013 0802    Baseline Immunosuppressant Therapy Labs TB GOLD    Latest Ref Rng & Units 06/24/2023    3:18 PM  Quantiferon TB Gold  Quantiferon TB Gold Plus NEGATIVE NEGATIVE    Hepatitis Panel    Latest Ref Rng & Units 06/23/2018    3:51 PM  Hepatitis  Hep B Surface Ag NON-REACTI NON-REACTIVE   Hep B IgM NON-REACTI NON-REACTIVE   Hep C Ab NON-REACTI NON-REACTIVE   Hep A IgM NON-REACTI NON-REACTIVE    HIV Lab Results  Component Value Date   HIV NONREACTIVE 12/21/2014   Immunoglobulins    Latest Ref Rng & Units 06/23/2018    3:51 PM  Immunoglobulin Electrophoresis  IgA  47 - 310 mg/dL 599   IgG 399 - 8,359 mg/dL 7,803   IgM 50 - 699 mg/dL 881    SPEP    Latest Ref Rng & Units 11/29/2023    8:30 AM  Serum Protein Electrophoresis  Total Protein 6.1 - 8.1 g/dL 6.1 - 8.1 g/dL 8.2    8.2   Albumin 3.8 - 4.8 g/dL 4.3   Alpha-1 0.2 - 0.3 g/dL 0.3  Alpha-2 0.5 - 0.9 g/dL 0.6   Beta Globulin 0.4 - 0.6 g/dL 0.5   Beta 2 0.2 - 0.5 g/dL 0.5   Gamma Globulin 0.8 - 1.7 g/dL 2.1   Interpretation  --    G6PD No results found for: G6PDH TPMT No results found for: TPMT   Chest x-ray: 09/16/20-Stable exam without acute or active cardiopulmonary disease   Assessment/Plan:   Administrations This Visit     certolizumab pegol  (CIMZIA ) kit 400 mg     Admin Date 03/23/2024 Action Given Dose 400 mg Route Subcutaneous Documented By Cena Alfonso CROME, LPN             Patient tolerated injection well.   Appointment for next injection scheduled for 04/20/2024.  Patient due for labs today and were drawn while in office.  Patient is to call and reschedule appointment if running a fever with signs/symptoms of infection, on antibiotics for active infection or has an upcoming invasive procedure.  All questions encouraged and answered.  Instructed patient to call with any further questions or concerns.

## 2024-03-23 NOTE — Progress Notes (Unsigned)
 Per Waddell Craze, place referral to Spine Specialist.

## 2024-03-23 NOTE — Progress Notes (Signed)
 Office Visit Note  Patient: Brittany Hodges             Date of Birth: 07-10-1965           MRN: [id number]             PCP: Amon Aloysius BRAVO, MD Referring: Amon Aloysius BRAVO, MD Visit Date: 03/23/2024 Occupation: [employer]  Subjective:  Flare   History of Present Illness: Brittany Hodges is a 58 y.o. female with history of rheumatoid arthritis, iritis, and sjoren's syndrome. Patient remains on Cimzia  400 mg sq injections once monthly.  Her most recent dose of Cimzia  was administered on 02/09/2024.  She is overdue for dose of Cimzia  and is currently experiencing exacerbation of symptoms.  She is having pain and inflammation in both thumbs as well as inflammation in the left 3rd and 4th PIP joints.  She is also had increased discomfort in left shoulder for the past 2 weeks.  Patient experiences stiffness in both knees especially after sitting for prolonged periods of time and going to raise from a seated position.  Patient states that she now realizes that Cimzia  is working since she has noticed an exacerbation of symptoms with a gap in treatment. Patient states that she has had ongoing discomfort in her lower back which she attributes to straining her back while caring for her mother.  Patient has not yet been evaluated for lower back pain. Patient states that she continues to see her ophthalmologist Dr. Cleotilde on a monthly basis.  She has reduced prednisone  drops to once daily.  Patient states that her eyes remain dry but she has not had an iritis flare recently.  She continues to have mild dryness which has been manageable.  She sees a education officer, community every 6 months. She denies any recent or recurrent infections. She reports she had an updated DEXA on 12/24/23.  She is taking a calcium and vitamin D  supplement.    Activities of Daily Living:  Patient reports morning stiffness for 0 minute.   Patient Denies nocturnal pain.  Difficulty dressing/grooming: Denies Difficulty climbing stairs:  Denies Difficulty getting out of chair: Denies Difficulty using hands for taps, buttons, cutlery, and/or writing: Reports  Review of Systems  Constitutional:  Negative for fatigue.  HENT:  Positive for mouth dryness. Negative for mouth sores.   Eyes:  Positive for dryness.  Respiratory:  Negative for shortness of breath.   Cardiovascular:  Negative for chest pain and palpitations.  Gastrointestinal:  Negative for blood in stool, constipation and diarrhea.  Endocrine: Negative for increased urination.  Genitourinary:  Negative for involuntary urination.  Musculoskeletal:  Positive for joint pain, joint pain, joint swelling, myalgias and myalgias. Negative for gait problem, muscle weakness, morning stiffness and muscle tenderness.  Skin:  Negative for color change, rash, hair loss and sensitivity to sunlight.  Allergic/Immunologic: Negative for susceptible to infections.  Neurological:  Negative for dizziness and headaches.  Hematological:  Negative for swollen glands.  Psychiatric/Behavioral:  Negative for depressed mood and sleep disturbance. The patient is not nervous/anxious.     PMFS History:  Patient Active Problem List   Diagnosis Date Noted   Viral URI with cough 03/03/2024   Congestion of nasal sinus 03/03/2024   Acute cough 03/03/2024   Osteopenia 01/27/2022   Bradycardia 03/06/2021   Benign paroxysmal positional vertigo 03/06/2021   Mild intermittent asthma 12/22/2020   Chronic sinusitis 09/14/2019   PCP NOTES >>>>> 03/09/2015   Annual physical exam 12/21/2014  Cardiomegaly, by MRI 03/16/2014   Leukopenia 03/10/2014   SINOATRIAL NODE DYSFUNCTION 08/06/2008   ATRIAL SEPTAL DEFECT 06/29/2007   Hypothyroidism 03/21/2007   Essential hypertension 03/21/2007   Perennial allergic rhinitis 03/21/2007   GERD-- dysphagia 03/21/2007   CONSTIPATION 03/21/2007   IRRITABLE BOWEL SYNDROME 03/21/2007   RA (rheumatoid arthritis) (HCC) 03/21/2007   FIBROMYALGIA 03/21/2007     Past Medical History:  Diagnosis Date   Allergic rhinitis, cause unspecified    Anemia    Asthma    Atrial septal defect    s/p repair;   Echo 08/07/10: EF 55-60%; mild MR; atrial septal aneurysm; no residual ASD   Chest pain, unspecified    cardiac cath 07/31/10: normal coronaries; vigorous LVF   Esophageal reflux    Fibromyalgia    Headache(784.0)    Hematuria, unspecified    Hypertension    no meds    Hypothyroidism    Irritable bowel syndrome    Low back pain    Rheumatoid arthritis(714.0)    Dr Margarita   Scoliosis    Sinoatrial node dysfunction (HCC)    eval. for chronotropic competence completed in past   Sjogren's disease     Family History  Problem Relation Age of Onset   Hypertension Mother    Diabetes Mother    Kidney failure Mother    Glaucoma Father    Hypertension Sister    Thyroid  disease Sister    Hypertension Sister    Thyroid  disease Sister    Heart disease Maternal Grandmother    Stomach cancer Maternal Grandmother 95   Rheum arthritis Maternal Grandmother    Sinusitis Daughter    Asthma Son    Eczema Son    Breast cancer Other        aunt   Diabetes Other        GM   Colon cancer Neg Hx    Allergic rhinitis Neg Hx    Angioedema Neg Hx    Immunodeficiency Neg Hx    Past Surgical History:  Procedure Laterality Date   ASD REPAIR  1985   TONSILLECTOMY AND ADENOIDECTOMY     TUBAL LIGATION     Social History   Tobacco Use   Smoking status: Never    Passive exposure: Never   Smokeless tobacco: Never   Tobacco comments:    never used tobacco  Vaping Use   Vaping status: Never Used  Substance Use Topics   Alcohol use: No   Drug use: No   Social History   Social History Narrative   Household-- lives by herself     Immunization History  Administered Date(s) Administered   Dtap, Unspecified 11/05/1969, 01/08/1970, 07/01/1978   Influenza Split 03/17/2011, 03/17/2012, 02/16/2023   Influenza Whole 04/05/2009, 02/21/2010    Influenza, Seasonal, Injecte, Preservative Fre 03/11/2021   Influenza,inj,Quad PF,6+ Mos 03/21/2013, 03/09/2014, 05/24/2015, 01/27/2016   Influenza-Unspecified 07/30/2020, 02/17/2022   Measles 10/20/1976   PFIZER(Purple Top)SARS-COV-2 Vaccination 12/16/2019, 01/16/2020   Pneumococcal Conjugate-13 01/27/2016   Pneumococcal Polysaccharide-23 03/08/2015   Polio, Unspecified 12/08/1969, 07/01/1978, 08/09/1978   Rubella 10/20/1976   Smallpox 01/08/1970   Td 07/23/2010   Tdap 05/28/2020, 02/25/2022     Objective: Vital Signs: BP 125/75   Pulse 62   Temp 97.8 F (36.6 C)   Resp 14   Ht 5' 8 (1.727 m)   Wt 135 lb (61.2 kg)   LMP 05/01/2016   BMI 20.53 kg/m    Physical Exam Vitals and nursing note reviewed.  Constitutional:  Appearance: She is well-developed.  HENT:     Head: Normocephalic and atraumatic.  Eyes:     Conjunctiva/sclera: Conjunctivae normal.  Cardiovascular:     Rate and Rhythm: Normal rate and regular rhythm.     Heart sounds: Normal heart sounds.  Pulmonary:     Effort: Pulmonary effort is normal.     Breath sounds: Normal breath sounds.  Abdominal:     General: Bowel sounds are normal.     Palpations: Abdomen is soft.  Musculoskeletal:     Cervical back: Normal range of motion.  Lymphadenopathy:     Cervical: No cervical adenopathy.  Skin:    General: Skin is warm and dry.     Capillary Refill: Capillary refill takes less than 2 seconds.  Neurological:     Mental Status: She is alert and oriented to person, place, and time.  Psychiatric:        Behavior: Behavior normal.      Musculoskeletal Exam: C-spine has good range of motion.  Some midline spinal tenderness in the lower lumbar region.  Shoulder joints have good range of motion with some discomfort in the left shoulder.  Elbow joints have good range of motion no tenderness along the joint line.  Wrist joints have good range of motion with no tenderness or synovitis.  Inflammation noted in  bilateral first PIP joints and the left 3rd and 4th PIP joints.  Hip joints have good range of motion with no groin pain.  Knee joints have good range of motion no warmth or effusion.  Ankle joints have good range of motion no tenderness or joint swelling.  CDAI Exam: CDAI Score: -- Patient Global: --; Provider Global: -- Swollen: 4 ; Tender: 5  Joint Exam 03/23/2024      Right  Left  Glenohumeral      Tender  IP (thumb)  Swollen Tender  Swollen Tender  PIP 3 (finger)     Swollen Tender  PIP 4 (finger)     Swollen Tender     Investigation: No additional findings.  Imaging: No results found.  Recent Labs: Lab Results  Component Value Date   WBC 3.7 (L) 01/06/2024   HGB 11.9 01/06/2024   PLT 157 01/06/2024   NA 141 11/29/2023   K 4.0 11/29/2023   CL 106 11/29/2023   CO2 27 11/29/2023   GLUCOSE 83 11/29/2023   BUN 12 11/29/2023   CREATININE 0.78 11/29/2023   BILITOT 0.5 11/29/2023   ALKPHOS 54 10/04/2022   AST 22 11/29/2023   ALT 12 11/29/2023   PROT 8.2 (H) 11/29/2023   PROT 8.2 (H) 11/29/2023   ALBUMIN 4.0 10/04/2022   CALCIUM 10.2 11/29/2023   GFRAA 89 02/15/2020   QFTBGOLDPLUS NEGATIVE 06/24/2023    Speciality Comments: Dexa- 05/27/18 Osteopenia T-Score -1.7 BMD 0.963  Prior therapy includes: Plaquenil (inadequate response) , Enbrel (injection site reaction) and methotrexate  (neutropenia so on low dose).  Procedures:  No procedures performed Allergies: Etanercept, Methotrexate  and trimetrexate, and Pregabalin     Assessment / Plan:     Visit Diagnoses: Rheumatoid arthritis involving multiple sites with positive rheumatoid factor (HCC) -She presents today experiencing an exacerbation of pain involving multiple joints.  For the past 2 weeks she has had increased discomfort in the left shoulder as well as tenderness and inflammation affecting both hands.  She has tenderness and synovitis of bilateral first PIP joints and the left 3rd and 4th PIP joints.  She has  also had some increased  stiffness in both knee joints especially after sitting for prolonged periods of time.  Her most recent dose of Cimzia  was administered on 02/09/2024.  Patient noticed improvement after the last dose of Cimzia  and feels that the exacerbation of symptoms is related to being overdue for her dose of Cimzia .  Cimzia  was administered today while in the office.  A prednisone  taper was sent to the pharmacy to alleviate the current flare.  Encouraged the patient to remain on Cimzia  monthly injections without interruption if possible. She will follow-up in the office in 3 months or sooner if needed.  Plan: Sedimentation rate  High risk medication use - Cimzia  400 mg sq injections once monthly-in-office administered.  Last dose of Cimzia  administered on 02/09/2024.  Cimzia  was administered today in the office on 03/23/2024. CBC updated on 01/06/24. CMP updated on 11/29/23.  Orders for CBC and CMP were released today.  She will continue to require close lab monitoring. TB gold negative on 06/24/23 No recent or recurrent infections. Discussed the importance of holding cimzia  if she develops signs or symptoms of an infection and to resume once the infection has completely cleared  - Plan: CBC with Differential/Platelet, Comprehensive metabolic panel with GFR  Iritis - Under care of Dr. Cleotilde.  No recent iritis flares.  Her primary concern has been chronic eye dryness.  She has reduced prednisone  eyedrops to once daily.  She continues to see Dr. Cleotilde on a monthly basis.- Plan: Sedimentation rate  Sjogren's syndrome with other organ involvement - Ro>8. La-: Evaluated by ENT--diffuse bilateral parotid sialolithiasis-CT 10/04/22-advanced chronic bilateral parotid gland sialolithiasis: She continues to have chronic sicca symptoms which have been stable overall. Patient has been seeing ophthalmology on a monthly basis.  She has been able to titrate prednisone  drops to only once daily.  She has  continued to see the dentist every 6 months as recommended.  She is not experiencing any new pulmonary symptoms.  Her lungs were clear to auscultation.  Discussed increased risk for developing interstitial lung disease and lymphoma in patients with Sjogren syndrome.  Plan to obtain the following lab work today for further evaluation.  She will notify us  if she develops any new or worsening symptoms.- Plan: C3 and C4, Sedimentation rate, Urinalysis, Routine w reflex microscopic, Serum protein electrophoresis with reflex  Other neutropenia - CBC with diff updated today.  Plan: CBC with Differential/Platelet  Primary osteoarthritis of both hands: She presents today experiencing a rheumatoid arthritis flare affecting both hands.  She has tenderness and synovitis of bilateral first PIP joints and the left 3rd and 4th PIP joints.  A prednisone  taper sent to the pharmacy as discussed above.  Cimzia  was also administered today in the office.  Primary osteoarthritis of both feet: Good range of motion of both ankle joints with some tenderness of the left ankle.  No synovitis noted.   DDD (degenerative disc disease), cervical: C-spine has good range of motion with no discomfort.  No symptoms of radiculopathy.  Trochanteric bursitis, right hip: Not currently symptomatic.    Osteopenia of multiple sites - DEXA updated on 10/28/2020 AP spine BMD 0.921 with T-score -1.1. -4% change in BMD.  DEXA updated on 12/24/23: AP spine BMD 0.814 T-score -2.1.  -12% in BMD of total spine.  -9 change of Right total femur.  -6 change in left total femur.   Discussed DEXA results.  She has started taking calcium and vitamin D  supplement daily.  Discussed the option of adding Fosamax 70 mg  once weekly but she would like to hold off at this time.  She has not had any falls or fractures.  Discussed the importance of continuing to update a bone density every 2 years.  Arthropathy of lumbar facet joint: Plan to place referral to a spine  specialist for further evaluation and management.  History of scoliosis  Other medical conditions are listed as follows:  History of migraine  History of gastroesophageal reflux (GERD)  History of IBS  History of hypothyroidism  History of hypertension: Blood pressure was 125/75 today in the office.  Orders: Orders Placed This Encounter  Procedures   CBC with Differential/Platelet   Comprehensive metabolic panel with GFR   C3 and C4   Sedimentation rate   Urinalysis, Routine w reflex microscopic   Serum protein electrophoresis with reflex   Meds ordered this encounter  Medications   predniSONE  (DELTASONE ) 5 MG tablet    Sig: Take 4 tabs po x 4 days, 3  tabs po x 4 days, 2  tabs po x 4 days, 1  tab po x 4 days    Dispense:  40 tablet    Refill:  0    Follow-Up Instructions: Return in about 3 months (around 06/23/2024) for Rheumatoid arthritis.   Waddell CHRISTELLA Craze, PA-C  Note - This record has been created using Dragon software.  Chart creation errors have been sought, but may not always  have been located. Such creation errors do not reflect on  the standard of medical care.

## 2024-03-24 ENCOUNTER — Ambulatory Visit: Payer: Self-pay | Admitting: Physician Assistant

## 2024-03-24 NOTE — Progress Notes (Signed)
 WBC count is low but stable-3.5.  rest of CBC stable.  CMP WNL UA normal  ESR remains elevated but stable.

## 2024-03-24 NOTE — Progress Notes (Signed)
Complements WNL

## 2024-03-25 LAB — PROTEIN ELECTROPHORESIS, SERUM, WITH REFLEX
Albumin ELP: 4.1 g/dL (ref 3.8–4.8)
Alpha 1: 0.3 g/dL (ref 0.2–0.3)
Alpha 2: 0.6 g/dL (ref 0.5–0.9)
Beta 2: 0.5 g/dL (ref 0.2–0.5)
Beta Globulin: 0.5 g/dL (ref 0.4–0.6)
Gamma Globulin: 2 g/dL — ABNORMAL HIGH (ref 0.8–1.7)
Total Protein: 8 g/dL (ref 6.1–8.1)

## 2024-03-25 LAB — URINALYSIS, ROUTINE W REFLEX MICROSCOPIC
Bilirubin Urine: NEGATIVE
Glucose, UA: NEGATIVE
Hgb urine dipstick: NEGATIVE
Ketones, ur: NEGATIVE
Leukocytes,Ua: NEGATIVE
Nitrite: NEGATIVE
Protein, ur: NEGATIVE
Specific Gravity, Urine: 1.004 (ref 1.001–1.035)
pH: 6 (ref 5.0–8.0)

## 2024-03-25 LAB — CBC WITH DIFFERENTIAL/PLATELET
Absolute Lymphocytes: 1029 {cells}/uL (ref 850–3900)
Absolute Monocytes: 459 {cells}/uL (ref 200–950)
Basophils Absolute: 21 {cells}/uL (ref 0–200)
Basophils Relative: 0.6 %
Eosinophils Absolute: 32 {cells}/uL (ref 15–500)
Eosinophils Relative: 0.9 %
HCT: 40.2 % (ref 35.0–45.0)
Hemoglobin: 12.5 g/dL (ref 11.7–15.5)
MCH: 27.9 pg (ref 27.0–33.0)
MCHC: 31.1 g/dL — ABNORMAL LOW (ref 32.0–36.0)
MCV: 89.7 fL (ref 80.0–100.0)
MPV: 12.1 fL (ref 7.5–12.5)
Monocytes Relative: 13.1 %
Neutro Abs: 1960 {cells}/uL (ref 1500–7800)
Neutrophils Relative %: 56 %
Platelets: 161 Thousand/uL (ref 140–400)
RBC: 4.48 Million/uL (ref 3.80–5.10)
RDW: 11.5 % (ref 11.0–15.0)
Total Lymphocyte: 29.4 %
WBC: 3.5 Thousand/uL — ABNORMAL LOW (ref 3.8–10.8)

## 2024-03-25 LAB — COMPREHENSIVE METABOLIC PANEL WITH GFR
AG Ratio: 1.2 (calc) (ref 1.0–2.5)
ALT: 10 U/L (ref 6–29)
AST: 22 U/L (ref 10–35)
Albumin: 4.4 g/dL (ref 3.6–5.1)
Alkaline phosphatase (APISO): 73 U/L (ref 37–153)
BUN: 14 mg/dL (ref 7–25)
CO2: 28 mmol/L (ref 20–32)
Calcium: 10.2 mg/dL (ref 8.6–10.4)
Chloride: 104 mmol/L (ref 98–110)
Creat: 0.88 mg/dL (ref 0.50–1.05)
Globulin: 3.6 g/dL (ref 1.9–3.7)
Glucose, Bld: 90 mg/dL (ref 65–99)
Potassium: 4 mmol/L (ref 3.5–5.3)
Sodium: 139 mmol/L (ref 135–146)
Total Bilirubin: 0.7 mg/dL (ref 0.2–1.2)
Total Protein: 8 g/dL (ref 6.1–8.1)
eGFR: 75 mL/min/1.73m2 (ref >=60)

## 2024-03-25 LAB — C3 AND C4
C3 Complement: 103 mg/dL (ref 83–193)
C4 Complement: 15 mg/dL (ref 15–57)

## 2024-03-25 LAB — SEDIMENTATION RATE: Sed Rate: 38 mm/h — ABNORMAL HIGH (ref 0–30)

## 2024-03-27 NOTE — Progress Notes (Signed)
SPEP no abnormal protein bands.

## 2024-04-20 ENCOUNTER — Encounter: Payer: Self-pay | Admitting: *Deleted

## 2024-04-20 ENCOUNTER — Ambulatory Visit: Attending: Rheumatology | Admitting: *Deleted

## 2024-04-20 VITALS — BP 143/81 | HR 48

## 2024-04-20 DIAGNOSIS — M0579 Rheumatoid arthritis with rheumatoid factor of multiple sites without organ or systems involvement: Secondary | ICD-10-CM | POA: Diagnosis not present

## 2024-04-20 MED ORDER — CERTOLIZUMAB PEGOL 2 X 200 MG ~~LOC~~ KIT
400.0000 mg | PACK | Freq: Once | SUBCUTANEOUS | Status: AC
  Administered 2024-04-20: 400 mg via SUBCUTANEOUS

## 2024-04-20 NOTE — Progress Notes (Signed)
 Pharmacy Note  Subjective:   Patient presents to clinic today to receive monthly dose of Cimzia .  Patient running a fever or have signs/symptoms of infection? No  Patient currently on antibiotics for the treatment of infection? No  Patient have any upcoming invasive procedures/surgeries? No  Objective: CMP     Component Value Date/Time   NA 139 03/23/2024 0842   NA 144 04/08/2021 1216   K 4.0 03/23/2024 0842   CL 104 03/23/2024 0842   CO2 28 03/23/2024 0842   GLUCOSE 90 03/23/2024 0842   BUN 14 03/23/2024 0842   BUN 15 04/08/2021 1216   CREATININE 0.88 03/23/2024 0842   CALCIUM 10.2 03/23/2024 0842   PROT 8.0 03/23/2024 0842   PROT 8.0 03/23/2024 0842   PROT 7.9 04/08/2021 1216   ALBUMIN 4.0 10/04/2022 0703   ALBUMIN 4.5 04/08/2021 1216   AST 22 03/23/2024 0842   AST 21 02/18/2021 1452   ALT 10 03/23/2024 0842   ALT 13 02/18/2021 1452   ALKPHOS 54 10/04/2022 0703   BILITOT 0.7 03/23/2024 0842   BILITOT 0.3 04/08/2021 1216   BILITOT 0.4 02/18/2021 1452   GFRNONAA >60 08/25/2023 1817   GFRNONAA 53 (L) 02/18/2021 1452   GFRNONAA 77 02/15/2020 1023   GFRAA 89 02/15/2020 1023    CBC    Component Value Date/Time   WBC 3.5 (L) 03/23/2024 0842   RBC 4.48 03/23/2024 0842   HGB 12.5 03/23/2024 0842   HGB 12.3 04/08/2021 1216   HGB 11.2 (L) 07/26/2013 0802   HCT 40.2 03/23/2024 0842   HCT 39.3 04/08/2021 1216   HCT 35.5 07/26/2013 0802   PLT 161 03/23/2024 0842   PLT 140 (L) 04/08/2021 1216   MCV 89.7 03/23/2024 0842   MCV 90 04/08/2021 1216   MCV 86 07/26/2013 0802   MCH 27.9 03/23/2024 0842   MCHC 31.1 (L) 03/23/2024 0842   RDW 11.5 03/23/2024 0842   RDW 12.0 04/08/2021 1216   RDW 13.6 07/26/2013 0802   LYMPHSABS 1,395 02/22/2023 1546   LYMPHSABS 2.0 04/08/2021 1216   LYMPHSABS 0.9 07/26/2013 0802   MONOABS 1.4 (H) 10/04/2022 0703   EOSABS 32 03/23/2024 0842   EOSABS 0.0 04/08/2021 1216   EOSABS 0.0 07/26/2013 0802   BASOSABS 21 03/23/2024 0842    BASOSABS 0.0 04/08/2021 1216   BASOSABS 0.0 07/26/2013 0802    Baseline Immunosuppressant Therapy Labs TB GOLD    Latest Ref Rng & Units 06/24/2023    3:18 PM  Quantiferon TB Gold  Quantiferon TB Gold Plus NEGATIVE NEGATIVE    Hepatitis Panel    Latest Ref Rng & Units 06/23/2018    3:51 PM  Hepatitis  Hep B Surface Ag NON-REACTI NON-REACTIVE   Hep B IgM NON-REACTI NON-REACTIVE   Hep C Ab NON-REACTI NON-REACTIVE   Hep A IgM NON-REACTI NON-REACTIVE    HIV Lab Results  Component Value Date   HIV NONREACTIVE 12/21/2014   Immunoglobulins    Latest Ref Rng & Units 06/23/2018    3:51 PM  Immunoglobulin Electrophoresis  IgA  47 - 310 mg/dL 599   IgG 399 - 8,359 mg/dL 7,803   IgM 50 - 699 mg/dL 881    SPEP    Latest Ref Rng & Units 03/23/2024    8:42 AM  Serum Protein Electrophoresis  Total Protein 6.1 - 8.1 g/dL 6.1 - 8.1 g/dL 8.0    8.0   Albumin 3.8 - 4.8 g/dL 4.1   Alpha-1 0.2 - 0.3 g/dL 0.3  Alpha-2 0.5 - 0.9 g/dL 0.6   Beta Globulin 0.4 - 0.6 g/dL 0.5   Beta 2 0.2 - 0.5 g/dL 0.5   Gamma Globulin 0.8 - 1.7 g/dL 2.0   Interpretation  --    G6PD No results found for: G6PDH TPMT No results found for: TPMT   Chest x-ray:  09/16/20-Stable exam without acute or active cardiopulmonary disease   Assessment/Plan:   Administrations This Visit     certolizumab pegol  (CIMZIA ) kit 400 mg     Admin Date 04/20/2024 Action Given Dose 400 mg Route Subcutaneous Documented By Cena Alfonso CROME, LPN             Patient tolerated injection well.   Appointment for next injection scheduled for 05/25/2024.  Patient due for labs in February 2026.  Patient is to call and reschedule appointment if running a fever with signs/symptoms of infection, on antibiotics for active infection or has an upcoming invasive procedure.  All questions encouraged and answered.  Instructed patient to call with any further questions or concerns.

## 2024-05-09 ENCOUNTER — Other Ambulatory Visit: Payer: Self-pay | Admitting: Internal Medicine

## 2024-05-09 NOTE — Telephone Encounter (Signed)
 Prescribed by Dr. Fernande at cardiology. Error CRM sent.

## 2024-05-09 NOTE — Telephone Encounter (Signed)
 Copied from CRM [id number]. Topic: Clinical - Medication Refill >> May 09, 2024 11:58 AM Deleta RAMAN wrote: Medication: amLODipine  (NORVASC ) 5 MG tablet  Has the patient contacted their pharmacy? Yes (Agent: If no, request that the patient contact the pharmacy for the refill. If patient does not wish to contact the pharmacy document the reason why and proceed with request.) (Agent: If yes, when and what did the pharmacy advise?)  This is the patient's preferred pharmacy:  [id number] - [place], [place] - [address] [place] [address] Phone: [phone] Fax: [phone]  Is this the correct pharmacy for this prescription? Yes If no, delete pharmacy and type the correct one.   Has the prescription been filled recently? Yes  Is the patient out of the medication? Yes  Has the patient been seen for an appointment in the last year OR does the patient have an upcoming appointment? Yes  Can we respond through MyChart? Yes  Agent: Please be advised that Rx refills may take up to 3 business days. We ask that you follow-up with your pharmacy.

## 2024-05-09 NOTE — Telephone Encounter (Signed)
 Copied from CRM #[id number]. Topic: Clinical - Prescription Issue >> May 09, 2024 12:01 PM Deleta RAMAN wrote: Reason for CRM: patient has been calling for refill on amLODipine  (NORVASC ) 5 MG tablet. Automatic systems says the request has been put in, but pharmacy does not have the request.

## 2024-05-15 ENCOUNTER — Other Ambulatory Visit: Payer: Self-pay | Admitting: Student

## 2024-05-16 MED ORDER — AMLODIPINE BESYLATE 5 MG PO TABS: 5.0000 mg | ORAL_TABLET | Freq: Every day | ORAL | 2 refills | Status: AC

## 2024-05-25 ENCOUNTER — Ambulatory Visit: Payer: Self-pay | Attending: Rheumatology

## 2024-05-25 ENCOUNTER — Encounter: Payer: Self-pay | Admitting: *Deleted

## 2024-05-25 VITALS — BP 123/70 | HR 53 | Temp 97.7°F

## 2024-05-25 DIAGNOSIS — M0579 Rheumatoid arthritis with rheumatoid factor of multiple sites without organ or systems involvement: Secondary | ICD-10-CM | POA: Diagnosis not present

## 2024-05-25 MED ORDER — CERTOLIZUMAB PEGOL 2 X 200 MG ~~LOC~~ KIT
400.0000 mg | PACK | Freq: Once | SUBCUTANEOUS | Status: AC
  Administered 2024-05-25: 400 mg via SUBCUTANEOUS

## 2024-05-25 NOTE — Progress Notes (Signed)
 Subjective:   Patient presents to clinic today to receive monthly dose of Cimzia .  Patient running a fever or have signs/symptoms of infection? No  Patient currently on antibiotics for the treatment of infection? No  Patient have any upcoming invasive procedures/surgeries? No  Objective: CMP     Component Value Date/Time   NA 139 03/23/2024 0842   NA 144 04/08/2021 1216   K 4.0 03/23/2024 0842   CL 104 03/23/2024 0842   CO2 28 03/23/2024 0842   GLUCOSE 90 03/23/2024 0842   BUN 14 03/23/2024 0842   BUN 15 04/08/2021 1216   CREATININE 0.88 03/23/2024 0842   CALCIUM 10.2 03/23/2024 0842   PROT 8.0 03/23/2024 0842   PROT 8.0 03/23/2024 0842   PROT 7.9 04/08/2021 1216   ALBUMIN 4.0 10/04/2022 0703   ALBUMIN 4.5 04/08/2021 1216   AST 22 03/23/2024 0842   AST 21 02/18/2021 1452   ALT 10 03/23/2024 0842   ALT 13 02/18/2021 1452   ALKPHOS 54 10/04/2022 0703   BILITOT 0.7 03/23/2024 0842   BILITOT 0.3 04/08/2021 1216   BILITOT 0.4 02/18/2021 1452   GFRNONAA >60 08/25/2023 1817   GFRNONAA 53 (L) 02/18/2021 1452   GFRNONAA 77 02/15/2020 1023   GFRAA 89 02/15/2020 1023    CBC    Component Value Date/Time   WBC 3.5 (L) 03/23/2024 0842   RBC 4.48 03/23/2024 0842   HGB 12.5 03/23/2024 0842   HGB 12.3 04/08/2021 1216   HGB 11.2 (L) 07/26/2013 0802   HCT 40.2 03/23/2024 0842   HCT 39.3 04/08/2021 1216   HCT 35.5 07/26/2013 0802   PLT 161 03/23/2024 0842   PLT 140 (L) 04/08/2021 1216   MCV 89.7 03/23/2024 0842   MCV 90 04/08/2021 1216   MCV 86 07/26/2013 0802   MCH 27.9 03/23/2024 0842   MCHC 31.1 (L) 03/23/2024 0842   RDW 11.5 03/23/2024 0842   RDW 12.0 04/08/2021 1216   RDW 13.6 07/26/2013 0802   LYMPHSABS 1,395 02/22/2023 1546   LYMPHSABS 2.0 04/08/2021 1216   LYMPHSABS 0.9 07/26/2013 0802   MONOABS 1.4 (H) 10/04/2022 0703   EOSABS 32 03/23/2024 0842   EOSABS 0.0 04/08/2021 1216   EOSABS 0.0 07/26/2013 0802   BASOSABS 21 03/23/2024 0842   BASOSABS 0.0 04/08/2021  1216   BASOSABS 0.0 07/26/2013 0802    Baseline Immunosuppressant Therapy Labs TB GOLD    Latest Ref Rng & Units 06/24/2023    3:18 PM  Quantiferon TB Gold  Quantiferon TB Gold Plus NEGATIVE NEGATIVE    Hepatitis Panel    Latest Ref Rng & Units 06/23/2018    3:51 PM  Hepatitis  Hep B Surface Ag NON-REACTI NON-REACTIVE   Hep B IgM NON-REACTI NON-REACTIVE   Hep C Ab NON-REACTI NON-REACTIVE   Hep A IgM NON-REACTI NON-REACTIVE    HIV Lab Results  Component Value Date   HIV NONREACTIVE 12/21/2014   Immunoglobulins    Latest Ref Rng & Units 06/23/2018    3:51 PM  Immunoglobulin Electrophoresis  IgA  47 - 310 mg/dL 599   IgG 399 - 8,359 mg/dL 7,803   IgM 50 - 699 mg/dL 881    SPEP    Latest Ref Rng & Units 03/23/2024    8:42 AM  Serum Protein Electrophoresis  Total Protein 6.1 - 8.1 g/dL 6.1 - 8.1 g/dL 8.0    8.0   Albumin 3.8 - 4.8 g/dL 4.1   Alpha-1 0.2 - 0.3 g/dL 0.3   Alpha-2  0.5 - 0.9 g/dL 0.6   Beta Globulin 0.4 - 0.6 g/dL 0.5   Beta 2 0.2 - 0.5 g/dL 0.5   Gamma Globulin 0.8 - 1.7 g/dL 2.0   Interpretation  --    G6PD No results found for: G6PDH TPMT No results found for: TPMT   Chest x-ray: 09/16/20-Stable exam without acute or active cardiopulmonary disease   Assessment/Plan:   Administrations This Visit     certolizumab pegol  (CIMZIA ) kit 400 mg     Admin Date 05/25/2024 Action Given Dose 400 mg Route Subcutaneous Documented By Enis Leatherwood M, CMA             Patient tolerated injection well.   Appointment for next injection scheduled for June 22, 2024.  Patient due for labs in February 2026.  Patient is to call and reschedule appointment if running a fever with signs/symptoms of infection, on antibiotics for active infection or has an upcoming invasive procedure.  All questions encouraged and answered.  Instructed patient to call with any further questions or concerns.

## 2024-05-31 ENCOUNTER — Telehealth: Payer: Self-pay | Admitting: Pharmacist

## 2024-05-31 NOTE — Telephone Encounter (Signed)
 Received notice that [employer] plan terminated on 05/17/2024  Patient has insurance through [facility] PPO plan through [facility]. Plan is active as of 05/18/2024. Called to initiate pre-certification [phone]. No pre-certification is required for buy-and-bill or outpatient medical service.s  Call reference # [id number]  Phone: [phone], option 2, option 3  Sherry Pennant, PharmD, MPH, BCPS, CPP Clinical Pharmacist

## 2024-06-22 ENCOUNTER — Ambulatory Visit

## 2024-06-28 ENCOUNTER — Ambulatory Visit

## 2024-07-21 ENCOUNTER — Encounter: Admitting: Internal Medicine

## 2024-12-15 ENCOUNTER — Encounter: Payer: Managed Care, Other (non HMO) | Admitting: Internal Medicine
